# Patient Record
Sex: Male | Born: 1960
Health system: Southern US, Community
[De-identification: ages and names within clinical notes are randomized; demographics above are authoritative.]

## PROBLEM LIST (undated history)

## (undated) DIAGNOSIS — D509 Iron deficiency anemia, unspecified: Secondary | ICD-10-CM

## (undated) DIAGNOSIS — K219 Gastro-esophageal reflux disease without esophagitis: Secondary | ICD-10-CM

## (undated) DIAGNOSIS — Z87891 Personal history of nicotine dependence: Secondary | ICD-10-CM

## (undated) DIAGNOSIS — K5790 Diverticulosis of intestine, part unspecified, without perforation or abscess without bleeding: Secondary | ICD-10-CM

## (undated) DIAGNOSIS — K2270 Barrett's esophagus without dysplasia: Secondary | ICD-10-CM

## (undated) DIAGNOSIS — N200 Calculus of kidney: Secondary | ICD-10-CM

## (undated) DIAGNOSIS — N183 Chronic kidney disease, stage 3 unspecified: Secondary | ICD-10-CM

## (undated) DIAGNOSIS — J189 Pneumonia, unspecified organism: Secondary | ICD-10-CM

## (undated) DIAGNOSIS — M48061 Spinal stenosis, lumbar region without neurogenic claudication: Secondary | ICD-10-CM

## (undated) DIAGNOSIS — G8929 Other chronic pain: Secondary | ICD-10-CM

## (undated) DIAGNOSIS — Z9689 Presence of other specified functional implants: Secondary | ICD-10-CM

## (undated) DIAGNOSIS — K649 Unspecified hemorrhoids: Secondary | ICD-10-CM

## (undated) DIAGNOSIS — J40 Bronchitis, not specified as acute or chronic: Secondary | ICD-10-CM

## (undated) DIAGNOSIS — I10 Essential (primary) hypertension: Secondary | ICD-10-CM

## (undated) DIAGNOSIS — I5189 Other ill-defined heart diseases: Secondary | ICD-10-CM

## (undated) DIAGNOSIS — J449 Chronic obstructive pulmonary disease, unspecified: Secondary | ICD-10-CM

## (undated) DIAGNOSIS — R569 Unspecified convulsions: Secondary | ICD-10-CM

## (undated) DIAGNOSIS — E039 Hypothyroidism, unspecified: Secondary | ICD-10-CM

## (undated) DIAGNOSIS — I219 Acute myocardial infarction, unspecified: Secondary | ICD-10-CM

## (undated) DIAGNOSIS — E785 Hyperlipidemia, unspecified: Secondary | ICD-10-CM

## (undated) DIAGNOSIS — E291 Testicular hypofunction: Secondary | ICD-10-CM

## (undated) DIAGNOSIS — D649 Anemia, unspecified: Secondary | ICD-10-CM

## (undated) DIAGNOSIS — J349 Unspecified disorder of nose and nasal sinuses: Secondary | ICD-10-CM

## (undated) DIAGNOSIS — N400 Enlarged prostate without lower urinary tract symptoms: Secondary | ICD-10-CM

## (undated) DIAGNOSIS — F32A Depression, unspecified: Secondary | ICD-10-CM

## (undated) DIAGNOSIS — R0789 Other chest pain: Secondary | ICD-10-CM

## (undated) DIAGNOSIS — K589 Irritable bowel syndrome without diarrhea: Secondary | ICD-10-CM

## (undated) DIAGNOSIS — M7581 Other shoulder lesions, right shoulder: Secondary | ICD-10-CM

## (undated) DIAGNOSIS — G47419 Narcolepsy without cataplexy: Secondary | ICD-10-CM

## (undated) DIAGNOSIS — I6523 Occlusion and stenosis of bilateral carotid arteries: Secondary | ICD-10-CM

## (undated) DIAGNOSIS — M542 Cervicalgia: Secondary | ICD-10-CM

## (undated) DIAGNOSIS — M1990 Unspecified osteoarthritis, unspecified site: Secondary | ICD-10-CM

## (undated) DIAGNOSIS — K6381 Dieulafoy lesion of intestine: Secondary | ICD-10-CM

## (undated) DIAGNOSIS — Z87442 Personal history of urinary calculi: Secondary | ICD-10-CM

## (undated) DIAGNOSIS — G5602 Carpal tunnel syndrome, left upper limb: Secondary | ICD-10-CM

## (undated) DIAGNOSIS — I700 Atherosclerosis of aorta: Secondary | ICD-10-CM

## (undated) DIAGNOSIS — F329 Major depressive disorder, single episode, unspecified: Secondary | ICD-10-CM

## (undated) DIAGNOSIS — R7303 Prediabetes: Secondary | ICD-10-CM

## (undated) DIAGNOSIS — K635 Polyp of colon: Secondary | ICD-10-CM

## (undated) DIAGNOSIS — F419 Anxiety disorder, unspecified: Secondary | ICD-10-CM

## (undated) DIAGNOSIS — J45909 Unspecified asthma, uncomplicated: Secondary | ICD-10-CM

## (undated) DIAGNOSIS — T7840XA Allergy, unspecified, initial encounter: Secondary | ICD-10-CM

## (undated) DIAGNOSIS — I2510 Atherosclerotic heart disease of native coronary artery without angina pectoris: Secondary | ICD-10-CM

## (undated) DIAGNOSIS — K3580 Unspecified acute appendicitis: Secondary | ICD-10-CM

## (undated) DIAGNOSIS — I2109 ST elevation (STEMI) myocardial infarction involving other coronary artery of anterior wall: Secondary | ICD-10-CM

## (undated) DIAGNOSIS — G43909 Migraine, unspecified, not intractable, without status migrainosus: Secondary | ICD-10-CM

## (undated) DIAGNOSIS — E063 Autoimmune thyroiditis: Secondary | ICD-10-CM

## (undated) HISTORY — PX: HEMORRHOID SURGERY: SHX153

## (undated) HISTORY — PX: LIPOMA EXCISION: SHX5283

## (undated) HISTORY — PX: APPENDECTOMY: SHX54

## (undated) HISTORY — DX: Unspecified acute appendicitis: K35.80

## (undated) HISTORY — DX: Hyperlipidemia, unspecified: E78.5

## (undated) HISTORY — DX: Major depressive disorder, single episode, unspecified: F32.9

## (undated) HISTORY — DX: Gastro-esophageal reflux disease without esophagitis: K21.9

## (undated) HISTORY — DX: Unspecified asthma, uncomplicated: J45.909

## (undated) HISTORY — DX: Pneumonia, unspecified organism: J18.9

## (undated) HISTORY — PX: OTHER SURGICAL HISTORY: SHX169

## (undated) HISTORY — PX: BACK SURGERY: SHX140

## (undated) HISTORY — DX: Presence of other specified functional implants: Z96.89

## (undated) HISTORY — PX: SPINAL CORD STIMULATOR IMPLANT: SHX2422

## (undated) HISTORY — DX: Other chronic pain: G89.29

## (undated) HISTORY — DX: Allergy, unspecified, initial encounter: T78.40XA

## (undated) HISTORY — DX: Essential (primary) hypertension: I10

## (undated) HISTORY — DX: Unspecified disorder of nose and nasal sinuses: J34.9

## (undated) HISTORY — DX: Unspecified hemorrhoids: K64.9

## (undated) HISTORY — DX: Polyp of colon: K63.5

## (undated) HISTORY — DX: Migraine, unspecified, not intractable, without status migrainosus: G43.909

## (undated) HISTORY — DX: Barrett's esophagus without dysplasia: K22.70

## (undated) HISTORY — DX: Calculus of kidney: N20.0

## (undated) HISTORY — DX: Acute myocardial infarction, unspecified: I21.9

## (undated) HISTORY — DX: Anemia, unspecified: D64.9

## (undated) HISTORY — DX: Hypothyroidism, unspecified: E03.9

## (undated) HISTORY — DX: Depression, unspecified: F32.A

## (undated) HISTORY — DX: Unspecified osteoarthritis, unspecified site: M19.90

## (undated) HISTORY — PX: ANTERIOR CERVICAL DISCECTOMY: SHX1160

## (undated) HISTORY — DX: Irritable bowel syndrome, unspecified: K58.9

## (undated) HISTORY — DX: Unspecified convulsions: R56.9

## (undated) HISTORY — DX: Bronchitis, not specified as acute or chronic: J40

## (undated) MED FILL — Iron Sucrose Inj 20 MG/ML (Fe Equiv): INTRAVENOUS | Qty: 10 | Status: AC

---
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
## 1991-02-23 DIAGNOSIS — E039 Hypothyroidism, unspecified: Secondary | ICD-10-CM | POA: Insufficient documentation

## 1999-02-23 DIAGNOSIS — F329 Major depressive disorder, single episode, unspecified: Secondary | ICD-10-CM | POA: Insufficient documentation

## 1999-09-23 DIAGNOSIS — F411 Generalized anxiety disorder: Secondary | ICD-10-CM | POA: Insufficient documentation

## 2001-04-15 ENCOUNTER — Encounter: Payer: Self-pay | Admitting: Neurosurgery

## 2001-04-15 ENCOUNTER — Ambulatory Visit (HOSPITAL_COMMUNITY): Admission: RE | Admit: 2001-04-15 | Discharge: 2001-04-15 | Payer: Self-pay | Admitting: Neurosurgery

## 2002-04-27 ENCOUNTER — Encounter: Admission: RE | Admit: 2002-04-27 | Discharge: 2002-04-27 | Payer: Self-pay | Admitting: Neurosurgery

## 2002-04-27 ENCOUNTER — Encounter: Payer: Self-pay | Admitting: Neurosurgery

## 2002-05-01 ENCOUNTER — Encounter: Admission: RE | Admit: 2002-05-01 | Discharge: 2002-05-01 | Payer: Self-pay | Admitting: Neurosurgery

## 2002-05-02 ENCOUNTER — Encounter: Payer: Self-pay | Admitting: Neurosurgery

## 2002-05-31 ENCOUNTER — Encounter: Payer: Self-pay | Admitting: Neurosurgery

## 2002-06-04 ENCOUNTER — Encounter: Payer: Self-pay | Admitting: Neurosurgery

## 2002-06-04 ENCOUNTER — Inpatient Hospital Stay (HOSPITAL_COMMUNITY): Admission: RE | Admit: 2002-06-04 | Discharge: 2002-06-05 | Payer: Self-pay | Admitting: Neurosurgery

## 2002-10-16 ENCOUNTER — Ambulatory Visit (HOSPITAL_COMMUNITY): Admission: RE | Admit: 2002-10-16 | Discharge: 2002-10-16 | Payer: Self-pay | Admitting: Neurosurgery

## 2002-10-16 ENCOUNTER — Encounter: Payer: Self-pay | Admitting: Neurosurgery

## 2002-11-23 ENCOUNTER — Encounter: Admission: RE | Admit: 2002-11-23 | Discharge: 2002-11-23 | Payer: Self-pay | Admitting: Neurosurgery

## 2002-11-23 ENCOUNTER — Encounter: Payer: Self-pay | Admitting: Neurosurgery

## 2002-11-23 ENCOUNTER — Encounter: Payer: Self-pay | Admitting: Radiology

## 2002-12-11 ENCOUNTER — Encounter: Payer: Self-pay | Admitting: Neurosurgery

## 2002-12-11 ENCOUNTER — Encounter: Admission: RE | Admit: 2002-12-11 | Discharge: 2002-12-11 | Payer: Self-pay | Admitting: Neurosurgery

## 2003-07-24 ENCOUNTER — Other Ambulatory Visit: Payer: Self-pay

## 2003-11-23 DIAGNOSIS — M7710 Lateral epicondylitis, unspecified elbow: Secondary | ICD-10-CM | POA: Insufficient documentation

## 2003-11-23 DIAGNOSIS — G5600 Carpal tunnel syndrome, unspecified upper limb: Secondary | ICD-10-CM | POA: Insufficient documentation

## 2004-01-27 ENCOUNTER — Ambulatory Visit: Payer: Self-pay | Admitting: Unknown Physician Specialty

## 2004-04-22 DIAGNOSIS — K219 Gastro-esophageal reflux disease without esophagitis: Secondary | ICD-10-CM | POA: Insufficient documentation

## 2004-04-22 DIAGNOSIS — E785 Hyperlipidemia, unspecified: Secondary | ICD-10-CM | POA: Insufficient documentation

## 2004-05-01 ENCOUNTER — Ambulatory Visit: Payer: Self-pay | Admitting: Family Medicine

## 2004-05-05 ENCOUNTER — Ambulatory Visit: Payer: Self-pay | Admitting: Family Medicine

## 2004-05-06 ENCOUNTER — Ambulatory Visit: Payer: Self-pay | Admitting: Unknown Physician Specialty

## 2004-07-16 ENCOUNTER — Ambulatory Visit: Payer: Self-pay | Admitting: Internal Medicine

## 2004-08-03 ENCOUNTER — Ambulatory Visit: Payer: Self-pay | Admitting: Family Medicine

## 2004-08-05 ENCOUNTER — Ambulatory Visit: Payer: Self-pay | Admitting: Family Medicine

## 2004-10-16 ENCOUNTER — Encounter: Admission: RE | Admit: 2004-10-16 | Discharge: 2004-10-16 | Payer: Self-pay | Admitting: Anesthesiology

## 2005-01-29 ENCOUNTER — Ambulatory Visit: Payer: Self-pay | Admitting: Family Medicine

## 2005-06-03 ENCOUNTER — Ambulatory Visit: Payer: Self-pay | Admitting: Family Medicine

## 2005-06-09 ENCOUNTER — Ambulatory Visit: Payer: Self-pay | Admitting: Family Medicine

## 2005-06-16 ENCOUNTER — Ambulatory Visit: Payer: Self-pay | Admitting: Family Medicine

## 2005-06-21 ENCOUNTER — Ambulatory Visit: Payer: Self-pay | Admitting: Family Medicine

## 2005-07-02 ENCOUNTER — Ambulatory Visit: Payer: Self-pay | Admitting: Family Medicine

## 2005-08-27 ENCOUNTER — Ambulatory Visit: Payer: Self-pay | Admitting: Family Medicine

## 2005-08-31 ENCOUNTER — Ambulatory Visit: Payer: Self-pay | Admitting: Family Medicine

## 2005-09-22 DIAGNOSIS — R7989 Other specified abnormal findings of blood chemistry: Secondary | ICD-10-CM | POA: Insufficient documentation

## 2005-09-22 DIAGNOSIS — N411 Chronic prostatitis: Secondary | ICD-10-CM | POA: Insufficient documentation

## 2005-09-22 DIAGNOSIS — N400 Enlarged prostate without lower urinary tract symptoms: Secondary | ICD-10-CM | POA: Insufficient documentation

## 2005-09-30 ENCOUNTER — Inpatient Hospital Stay: Payer: Self-pay | Admitting: Internal Medicine

## 2005-10-28 ENCOUNTER — Ambulatory Visit: Payer: Self-pay | Admitting: Family Medicine

## 2005-11-01 ENCOUNTER — Ambulatory Visit: Payer: Self-pay | Admitting: Family Medicine

## 2005-11-02 ENCOUNTER — Ambulatory Visit: Payer: Self-pay | Admitting: Internal Medicine

## 2005-11-05 ENCOUNTER — Ambulatory Visit: Payer: Self-pay | Admitting: Family Medicine

## 2005-11-10 ENCOUNTER — Ambulatory Visit: Payer: Self-pay | Admitting: Family Medicine

## 2005-11-11 ENCOUNTER — Ambulatory Visit: Payer: Self-pay | Admitting: Family Medicine

## 2005-11-11 ENCOUNTER — Ambulatory Visit: Payer: Self-pay | Admitting: Unknown Physician Specialty

## 2005-11-11 ENCOUNTER — Emergency Department: Payer: Self-pay | Admitting: Emergency Medicine

## 2005-11-18 ENCOUNTER — Ambulatory Visit: Payer: Self-pay | Admitting: Internal Medicine

## 2005-11-19 ENCOUNTER — Ambulatory Visit: Payer: Self-pay | Admitting: Family Medicine

## 2005-11-24 ENCOUNTER — Ambulatory Visit: Payer: Self-pay | Admitting: Internal Medicine

## 2005-11-24 ENCOUNTER — Observation Stay (HOSPITAL_COMMUNITY): Admission: EM | Admit: 2005-11-24 | Discharge: 2005-11-26 | Payer: Self-pay | Admitting: Emergency Medicine

## 2005-11-24 ENCOUNTER — Ambulatory Visit: Payer: Self-pay | Admitting: Cardiovascular Disease

## 2005-11-24 ENCOUNTER — Encounter: Payer: Self-pay | Admitting: Cardiology

## 2005-12-16 ENCOUNTER — Ambulatory Visit: Payer: Self-pay | Admitting: Internal Medicine

## 2006-01-14 ENCOUNTER — Ambulatory Visit: Payer: Self-pay | Admitting: Internal Medicine

## 2006-02-10 ENCOUNTER — Ambulatory Visit: Payer: Self-pay | Admitting: Family Medicine

## 2006-03-04 ENCOUNTER — Ambulatory Visit: Payer: Self-pay | Admitting: Family Medicine

## 2006-03-04 LAB — CONVERTED CEMR LAB
ALT: 26 units/L (ref 0–40)
AST: 21 units/L (ref 0–37)
Albumin: 3.9 g/dL (ref 3.5–5.2)
Alkaline Phosphatase: 90 units/L (ref 39–117)
BUN: 5 mg/dL — ABNORMAL LOW (ref 6–23)
CO2: 28 meq/L (ref 19–32)
Calcium: 9.3 mg/dL (ref 8.4–10.5)
Chloride: 111 meq/L (ref 96–112)
Chol/HDL Ratio, serum: 3.2
Cholesterol: 138 mg/dL (ref 0–200)
Creatinine, Ser: 1.2 mg/dL (ref 0.4–1.5)
Free T4: 0.7 ng/dL (ref 0.6–1.6)
GFR calc non Af Amer: 70 mL/min
Glomerular Filtration Rate, Af Am: 85 mL/min/{1.73_m2}
Glucose, Bld: 101 mg/dL — ABNORMAL HIGH (ref 70–99)
HCT: 44.1 % (ref 39.0–52.0)
HDL: 42.8 mg/dL (ref >39.0)
Hemoglobin: 14.1 g/dL (ref 13.0–17.0)
LDL Cholesterol: 80 mg/dL (ref 0–99)
MCHC: 32 g/dL (ref 30.0–36.0)
MCV: 84.8 fL (ref 78.0–100.0)
Platelets: 274 10*3/uL (ref 150–400)
Potassium: 3.8 meq/L (ref 3.5–5.1)
RBC: 5.2 M/uL (ref 4.22–5.81)
RDW: 13.1 % (ref 11.5–14.6)
Sodium: 144 meq/L (ref 135–145)
T3, Free: 2.7 pg/mL (ref 2.3–4.2)
TSH: 2.9 microintl units/mL (ref 0.35–5.50)
Testosterone, total: 5.0371 ng/mL
Total Bilirubin: 0.6 mg/dL (ref 0.3–1.2)
Total Protein: 6.8 g/dL (ref 6.0–8.3)
Triglyceride fasting, serum: 75 mg/dL (ref 0–149)
VLDL: 15 mg/dL (ref 0–40)
WBC: 7.4 10*3/uL (ref 4.5–10.5)

## 2006-03-08 ENCOUNTER — Ambulatory Visit: Payer: Self-pay | Admitting: Family Medicine

## 2006-04-13 ENCOUNTER — Ambulatory Visit: Payer: Self-pay | Admitting: Internal Medicine

## 2006-09-06 ENCOUNTER — Encounter (INDEPENDENT_AMBULATORY_CARE_PROVIDER_SITE_OTHER): Payer: Self-pay | Admitting: *Deleted

## 2006-09-23 ENCOUNTER — Encounter: Payer: Self-pay | Admitting: Family Medicine

## 2006-10-31 ENCOUNTER — Ambulatory Visit: Payer: Self-pay | Admitting: Family Medicine

## 2006-10-31 DIAGNOSIS — R5381 Other malaise: Secondary | ICD-10-CM | POA: Insufficient documentation

## 2006-10-31 DIAGNOSIS — R5383 Other fatigue: Secondary | ICD-10-CM

## 2006-10-31 LAB — CONVERTED CEMR LAB
ALT: 21 units/L (ref 0–53)
AST: 18 units/L (ref 0–37)
Albumin: 4.1 g/dL (ref 3.5–5.2)
Alkaline Phosphatase: 82 units/L (ref 39–117)
BUN: 6 mg/dL (ref 6–23)
Basophils Absolute: 0 10*3/uL (ref 0.0–0.1)
Basophils Relative: 0.6 % (ref 0.0–1.0)
Bilirubin, Direct: 0.1 mg/dL (ref 0.0–0.3)
CO2: 31 meq/L (ref 19–32)
Calcium: 9 mg/dL (ref 8.4–10.5)
Chloride: 108 meq/L (ref 96–112)
Creatinine, Ser: 1.1 mg/dL (ref 0.4–1.5)
Eosinophils Absolute: 0.2 10*3/uL (ref 0.0–0.6)
Eosinophils Relative: 2.5 % (ref 0.0–5.0)
Folate: >20 ng/mL
Free T4: 0.6 ng/dL (ref 0.6–1.6)
GFR calc Af Amer: 94 mL/min
GFR calc non Af Amer: 78 mL/min
Glucose, Bld: 89 mg/dL (ref 70–99)
HCT: 38.7 % — ABNORMAL LOW (ref 39.0–52.0)
Hemoglobin: 13.1 g/dL (ref 13.0–17.0)
Iron: 88 ug/dL (ref 42–165)
Lymphocytes Relative: 35.7 % (ref 12.0–46.0)
MCHC: 33.9 g/dL (ref 30.0–36.0)
MCV: 85.7 fL (ref 78.0–100.0)
Monocytes Absolute: 0.6 10*3/uL (ref 0.2–0.7)
Monocytes Relative: 7.9 % (ref 3.0–11.0)
Neutro Abs: 3.9 10*3/uL (ref 1.4–7.7)
Neutrophils Relative %: 53.3 % (ref 43.0–77.0)
Platelets: 260 10*3/uL (ref 150–400)
Potassium: 4 meq/L (ref 3.5–5.1)
RBC: 4.51 M/uL (ref 4.22–5.81)
RDW: 13.2 % (ref 11.5–14.6)
Sodium: 145 meq/L (ref 135–145)
TSH: 4.45 microintl units/mL (ref 0.35–5.50)
Total Bilirubin: 0.5 mg/dL (ref 0.3–1.2)
Total Protein: 7.2 g/dL (ref 6.0–8.3)
Vitamin B-12: 421 pg/mL (ref 211–911)
WBC: 7.3 10*3/uL (ref 4.5–10.5)

## 2006-12-23 ENCOUNTER — Ambulatory Visit: Payer: Self-pay | Admitting: Unknown Physician Specialty

## 2006-12-29 ENCOUNTER — Encounter: Payer: Self-pay | Admitting: Family Medicine

## 2007-01-30 ENCOUNTER — Telehealth: Payer: Self-pay | Admitting: Family Medicine

## 2007-01-31 ENCOUNTER — Ambulatory Visit: Payer: Self-pay | Admitting: Family Medicine

## 2007-02-01 ENCOUNTER — Telehealth: Payer: Self-pay | Admitting: Family Medicine

## 2007-02-01 LAB — CONVERTED CEMR LAB: TSH: 5.58 microintl units/mL — ABNORMAL HIGH (ref 0.35–5.50)

## 2007-02-28 ENCOUNTER — Telehealth: Payer: Self-pay | Admitting: Family Medicine

## 2007-03-10 ENCOUNTER — Encounter: Payer: Self-pay | Admitting: Family Medicine

## 2007-03-14 ENCOUNTER — Encounter: Payer: Self-pay | Admitting: Family Medicine

## 2007-03-27 ENCOUNTER — Encounter: Payer: Self-pay | Admitting: Family Medicine

## 2007-04-27 ENCOUNTER — Encounter: Payer: Self-pay | Admitting: Family Medicine

## 2007-05-25 ENCOUNTER — Encounter: Payer: Self-pay | Admitting: Family Medicine

## 2007-06-01 ENCOUNTER — Encounter: Payer: Self-pay | Admitting: Family Medicine

## 2007-06-16 ENCOUNTER — Telehealth: Payer: Self-pay | Admitting: Family Medicine

## 2007-06-20 ENCOUNTER — Ambulatory Visit: Payer: Self-pay | Admitting: Family Medicine

## 2007-06-20 DIAGNOSIS — IMO0002 Reserved for concepts with insufficient information to code with codable children: Secondary | ICD-10-CM | POA: Insufficient documentation

## 2007-06-29 ENCOUNTER — Encounter: Payer: Self-pay | Admitting: Family Medicine

## 2007-08-07 ENCOUNTER — Encounter: Payer: Self-pay | Admitting: Family Medicine

## 2007-08-18 ENCOUNTER — Telehealth: Payer: Self-pay | Admitting: Family Medicine

## 2007-08-21 ENCOUNTER — Telehealth: Payer: Self-pay | Admitting: Family Medicine

## 2007-10-27 ENCOUNTER — Encounter: Payer: Self-pay | Admitting: Family Medicine

## 2007-11-13 ENCOUNTER — Encounter: Payer: Self-pay | Admitting: Family Medicine

## 2007-12-04 ENCOUNTER — Encounter: Payer: Self-pay | Admitting: Family Medicine

## 2007-12-12 ENCOUNTER — Ambulatory Visit (HOSPITAL_COMMUNITY): Admission: RE | Admit: 2007-12-12 | Discharge: 2007-12-12 | Payer: Self-pay | Admitting: Anesthesiology

## 2008-02-08 ENCOUNTER — Encounter: Payer: Self-pay | Admitting: Family Medicine

## 2008-03-28 ENCOUNTER — Telehealth: Payer: Self-pay | Admitting: Family Medicine

## 2008-04-29 ENCOUNTER — Encounter: Payer: Self-pay | Admitting: Family Medicine

## 2008-07-11 ENCOUNTER — Encounter: Payer: Self-pay | Admitting: Family Medicine

## 2008-07-11 ENCOUNTER — Ambulatory Visit: Payer: Self-pay | Admitting: Internal Medicine

## 2008-08-05 ENCOUNTER — Telehealth: Payer: Self-pay | Admitting: Family Medicine

## 2008-09-09 ENCOUNTER — Telehealth: Payer: Self-pay | Admitting: Family Medicine

## 2008-10-14 ENCOUNTER — Telehealth: Payer: Self-pay | Admitting: Family Medicine

## 2008-10-16 ENCOUNTER — Ambulatory Visit: Payer: Self-pay | Admitting: Family Medicine

## 2008-10-16 DIAGNOSIS — I10 Essential (primary) hypertension: Secondary | ICD-10-CM | POA: Insufficient documentation

## 2008-11-11 ENCOUNTER — Encounter: Payer: Self-pay | Admitting: Family Medicine

## 2009-02-25 ENCOUNTER — Ambulatory Visit: Payer: Self-pay | Admitting: Family Medicine

## 2009-03-12 ENCOUNTER — Encounter: Admission: RE | Admit: 2009-03-12 | Discharge: 2009-03-12 | Payer: Self-pay | Admitting: Anesthesiology

## 2009-03-20 ENCOUNTER — Telehealth: Payer: Self-pay | Admitting: Family Medicine

## 2009-03-21 ENCOUNTER — Ambulatory Visit: Payer: Self-pay | Admitting: Family Medicine

## 2009-03-24 ENCOUNTER — Telehealth: Payer: Self-pay | Admitting: Family Medicine

## 2009-04-17 ENCOUNTER — Ambulatory Visit: Payer: Self-pay | Admitting: Family Medicine

## 2009-04-17 LAB — CONVERTED CEMR LAB
ALT: 20 units/L (ref 0–53)
AST: 20 units/L (ref 0–37)
Albumin: 3.9 g/dL (ref 3.5–5.2)
Alkaline Phosphatase: 54 units/L (ref 39–117)
BUN: 6 mg/dL (ref 6–23)
Basophils Absolute: 0 10*3/uL (ref 0.0–0.1)
Basophils Relative: 0 % (ref 0.0–3.0)
Bilirubin, Direct: 0 mg/dL (ref 0.0–0.3)
CO2: 30 meq/L (ref 19–32)
Calcium: 8.8 mg/dL (ref 8.4–10.5)
Chloride: 109 meq/L (ref 96–112)
Cholesterol: 179 mg/dL (ref 0–200)
Creatinine, Ser: 1.2 mg/dL (ref 0.4–1.5)
Creatinine,U: 165.7 mg/dL
Direct LDL: 121.6 mg/dL
Eosinophils Absolute: 0.1 10*3/uL (ref 0.0–0.7)
Eosinophils Relative: 1.3 % (ref 0.0–5.0)
Free T4: 0.6 ng/dL (ref 0.6–1.6)
GFR calc non Af Amer: 69.21 mL/min (ref >60)
Glucose, Bld: 95 mg/dL (ref 70–99)
HCT: 35.3 % — ABNORMAL LOW (ref 39.0–52.0)
HDL: 39.9 mg/dL (ref >39.00)
Hemoglobin: 11.5 g/dL — ABNORMAL LOW (ref 13.0–17.0)
Lymphocytes Relative: 29.4 % (ref 12.0–46.0)
Lymphs Abs: 1.9 10*3/uL (ref 0.7–4.0)
MCHC: 32.5 g/dL (ref 30.0–36.0)
MCV: 80.3 fL (ref 78.0–100.0)
Microalb Creat Ratio: 4.2 mg/g (ref 0.0–30.0)
Microalb, Ur: 0.7 mg/dL (ref 0.0–1.9)
Monocytes Absolute: 0.6 10*3/uL (ref 0.1–1.0)
Monocytes Relative: 9.4 % (ref 3.0–12.0)
Neutro Abs: 4 10*3/uL (ref 1.4–7.7)
Neutrophils Relative %: 59.9 % (ref 43.0–77.0)
Platelets: 313 10*3/uL (ref 150.0–400.0)
Potassium: 3.7 meq/L (ref 3.5–5.1)
RBC: 4.4 M/uL (ref 4.22–5.81)
RDW: 16.5 % — ABNORMAL HIGH (ref 11.5–14.6)
Sodium: 142 meq/L (ref 135–145)
TSH: 0.05 microintl units/mL — ABNORMAL LOW (ref 0.35–5.50)
Total Bilirubin: 0 mg/dL — ABNORMAL LOW (ref 0.3–1.2)
Total CHOL/HDL Ratio: 4
Total Protein: 7 g/dL (ref 6.0–8.3)
Triglycerides: 249 mg/dL — ABNORMAL HIGH (ref 0.0–149.0)
VLDL: 49.8 mg/dL — ABNORMAL HIGH (ref 0.0–40.0)
Vit D, 25-Hydroxy: >120 ng/mL — ABNORMAL HIGH (ref 30–89)
WBC: 6.6 10*3/uL (ref 4.5–10.5)

## 2009-04-22 ENCOUNTER — Ambulatory Visit: Payer: Self-pay | Admitting: Family Medicine

## 2009-05-21 ENCOUNTER — Encounter: Payer: Self-pay | Admitting: Family Medicine

## 2009-07-23 ENCOUNTER — Encounter: Payer: Self-pay | Admitting: Family Medicine

## 2009-07-25 ENCOUNTER — Telehealth: Payer: Self-pay | Admitting: Family Medicine

## 2009-07-29 ENCOUNTER — Ambulatory Visit: Payer: Self-pay | Admitting: Family Medicine

## 2009-09-22 ENCOUNTER — Ambulatory Visit: Payer: Self-pay | Admitting: Internal Medicine

## 2009-09-30 ENCOUNTER — Encounter (INDEPENDENT_AMBULATORY_CARE_PROVIDER_SITE_OTHER): Payer: Self-pay | Admitting: *Deleted

## 2009-10-10 ENCOUNTER — Inpatient Hospital Stay: Payer: Self-pay | Admitting: Specialist

## 2009-10-14 LAB — PATHOLOGY REPORT

## 2009-10-20 ENCOUNTER — Telehealth: Payer: Self-pay | Admitting: Family Medicine

## 2009-11-13 ENCOUNTER — Ambulatory Visit: Payer: Self-pay | Admitting: Internal Medicine

## 2009-11-14 ENCOUNTER — Ambulatory Visit: Payer: Self-pay | Admitting: Internal Medicine

## 2010-03-26 NOTE — Assessment & Plan Note (Signed)
Summary: refill medications/whc   Vital Signs:  Patient profile:   50 year old male Height:      71 inches Weight:      202 pounds BMI:     28.28 Temp:     98.4 degrees F oral Pulse rate:   80 / minute Pulse rhythm:   regular BP sitting:   140 / 80  (left arm) Cuff size:   large  Vitals Entered By: Providence Crosby LPN (October 16, 2008 9:07 AM) CC: refill medications //had spinal stimulator puit in 01/2008//colonoscopy at [facility] 2008   History of Present Illness: Pt here at my request for followup. He had a spinal stimulator for back pain inserted last Dec and has just been released to start exercising again soo his weight has been elevated also. Sister has some heart issues lately and pressure on him is elevated. He has been off cigarettes for three years two weeks ago and is trying tio get his sister off of them...she has angina and heart disease. This causes anxiousness in him. He has been seen multiple times by multiple people for multiple problems. Dr Patrecia Pace has released him. He is to have a sleep study soon. His Bp has been up and down lately at other offices.  Problems Prior to Update: 1)  Back Pain With Radiculopathy  (ICD-729.2) 2)  Hashimoto's Thyroiditis (DR MORYATI)  (ICD-245.2) 3)  Fatigue  (ICD-780.79) 4)  Hyperglycemia  (ICD-790.6) 5)  Fatty Liver Disease Via Chest Ct  (ICD-571.8) 6)  Prostatitis, Chronic  (ICD-601.1) 7)  Benign Prostatic Hypertrophy  (ICD-600.00) 8)  Depression  (ICD-311) 9)  Gerd  (ICD-530.81) 10)  Hyperlipidemia  (ICD-272.4) 11)  Carpal Tunnel Syndrome, Bilateral  (ICD-354.0) 12)  Lateral Epicondylitis, Bilateral  (ICD-726.32) 13)  Syndrome, Chronic Pain Due To L/s Back Dz  (ICD-338.4) 14)  Disorder, Tobacco Use  (ICD-305.1) 15)  Anxiety  (ICD-300.00) 16)  Hypothyroidism  (ICD-244.9)  Medications Prior to Update: 1)  Nexium 40 Mg Cpdr (Esomeprazole Magnesium) .... Take 1 Capsule By Mouth Once A Day 2)  Topamax 100 Mg Tabs (Topiramate) ....  Take 1 Tablet By Mouth At Bedtime 3)  Provigil 200 Mg Tabs (Modafinil) .... Take Tablet By Mouth Twice A Day 4)  Avinza 90 Mg Cp24 (Morphine Sulfate Beads) .... Take 1 Capsule By Mouth Once A Day 5)  Bupropion Hcl 300 Mg Tb24 (Bupropion Hcl) .... Take 1 Tablet By Mouth Once A Day 6)  Synthroid 175 Mcg  Tabs (Levothyroxine Sodium) .... One Tab By Mouth Once Daily On Empty Stomach 7)  Alprazolam 1 Mg Tabs (Alprazolam) .... Take 1 Tablet By Mouth Every Six Hours 8)  Vitamin E 1000 Unit  Caps (Vitamin E) .... Take One By Mouth Daily 9)  Mens Multivitamin Plus   Tabs (Multiple Vitamins-Minerals) .... Take One By Mouth Daily 10)  Stool Softener 11)  Testim !% .Marland Kitchen.. 1 Tube Daily 12)  Oxygen 2 Liters At [facility] 13)  Citalopram Hydrobromide 20 Mg  Tabs (Citalopram Hydrobromide) .Marland Kitchen.. 1 Daily 14)  Cytomel 25 Mcg  Tabs (Liothyronine Sodium) .Marland Kitchen.. 1 Daily 15)  Permethrin 5 %  Crea (Permethrin) .... Applyneck To Toes Including Under Nails and Repeated in One Week.  Allergies: 1)  ! Neurontin (Gabapentin) 2)  Amoxicillin (Amoxicillin) 3)  Penicillin G Potassium (Penicillin G Potassium)  Past History:  Past Surgical History: Epidurals, Facet injections, Joint injections, Myelogram  Pre 2005 ABD U/S  Fatty Liver 10/05/2001 Colonoscopy int hemms Mechele Collin) 02/2002 Back Surgery bone frag  removed (Kritzer) 2004 EGD Barretts, Dilation 05/06/2004 [facility] Atyp pneum  Interstit Pneumonmitis 8/9-8/18/2007 CT Chest [facility]) nml 10/01/2005 CT Abd/Pelvis nml 10/07/2005 CT Chest no PE sm hilar/med LN 11/24/2005 MRI Brain  [facility] Atyp CP,  Depress,  Chronic Pain Syndr, GERD 10/3-10/06/2005 ECHO nml EF 55% 11/25/2005       CT Angio no PE,  Fatty Liver Thyroid Nuclear Scan Nml Uptake, Decr Activity L Inf and Isthmus (Dr Kerrie Pleasure) 03/14/2007 Back Stimulator insertion for chronic back pain 01/2008  Physical Exam  General:  Well-developed,well-nourished,in no acute distress; alert,appropriate and cooperative throughout examination  Head:  Normocephalic and atraumatic without obvious abnormalities. No apparent alopecia or balding. Eyes:  Conjunctiva clear bilaterally.  Ears:  External ear exam shows no significant lesions or deformities.  Otoscopic examination reveals clear canals, tympanic membranes are intact bilaterally without bulging, retraction, inflammation or discharge. Hearing is grossly normal bilaterally. Cerumen in right ear occluding. Nose:  External nasal examination shows no deformity or inflammation. Nasal mucosa are pink and moist without lesions or exudates. Mouth:  Oral mucosa and oropharynx without lesions or exudates.  Teeth in good repair. Neck:  No deformities, masses, or tenderness noted. Chest Wall:  No deformities, masses, tenderness or gynecomastia noted. Lungs:  Normal respiratory effort, chest expands symmetrically. Lungs are clear to auscultation, no crackles or wheezes. Heart:  Normal rate and regular rhythm. S1 and S2 normal without gallop, murmur, click, rub or other extra sounds.   Impression & Recommendations:  Problem # 1:  BACK PAIN WITH RADICULOPATHY (ICD-729.2) Assessment Improved  Problem # 2:  ELEVATED BP READING WITHOUT DX HYPERTENSION (ICD-796.2) Assessment: New Lose weight and avoid salt. Recheck in 4 mos.  Complete Medication List: 1)  Nexium 40 Mg Cpdr (Esomeprazole magnesium) .... Take 1 capsule by mouth once a day 2)  Topamax 100 Mg Tabs (Topiramate) .... Take 1 tablet by mouth at bedtime 3)  Provigil 200 Mg Tabs (Modafinil) .... Take tablet by mouth twice a day 4)  Bupropion Hcl 300 Mg Tb24 (Bupropion hcl) .... Take 1 tablet by mouth once a day 5)  Synthroid 175 Mcg Tabs (Levothyroxine sodium) .... One tab by mouth once daily on empty stomach 6)  Alprazolam 1 Mg Tabs (Alprazolam) .... Take 1 tablet by mouth every six hours 7)  Vitamin E 1000 Unit Caps (Vitamin e) .... Take one by mouth daily 8)  Mens Multivitamin Plus Tabs (Multiple vitamins-minerals) .... Take one  by mouth daily 9)  Stool Softener  10)  Citalopram Hydrobromide 20 Mg Tabs (Citalopram hydrobromide) .Marland Kitchen.. 1 daily 11)  Cytomel 25 Mcg Tabs (Liothyronine sodium) .Marland Kitchen.. 1 daily 12)  Testosterone Cypionate 200 Mg/ml Oil (Testosterone cypionate) .... 2 shots per month 13)  Uroxatral 10 Mg Xr24h-tab (Alfuzosin hcl) .Marland Kitchen.. 1 daily by mouth 14)  Percocet 10-325 Mg Tabs (Oxycodone-acetaminophen) .... Every 4 to 6 hours as needed pain by mouth 15)  Omega 3 1200 Mg Daily  .Marland Kitchen.. 1 daily by mouth 16)  Vitamin D 500 Iu  .Marland Kitchen.. 1 daily by mouth 17)  Red Yeast Rice 600 Mg Tabs (Red yeast rice extract) .Marland Kitchen.. 1200 mg daily by mouth 18)  Vitamin B-12 500 Mcg Tabs (Cyanocobalamin) .Marland Kitchen.. 1 daily by mouth  Patient Instructions: 1)  RTC 4 mos BP recheck. Prescriptions: BUPROPION HCL 300 MG TB24 (BUPROPION HCL) Take 1 tablet by mouth once a day  #30 Tablet x 12   Entered and Authorized by:   Shaune Leeks MD   Signed by:   Molly Maduro  Liana Crocker MD on 10/16/2008   Method used:   Electronically to        [facility]. [address] [phone]* (retail)       [address] [place], [place]  [id number]       Ph: [phone]       Fax: [phone]   RxID:   [id number]    Immunization History:  Tetanus/Td Immunization History:    Tetanus/Td:  historical (02/22/2005)  Pneumovax Immunization History:    Pneumovax:  historical (02/22/2005)

## 2010-03-26 NOTE — Progress Notes (Signed)
Summary: ? Shingles  Phone Note Call from Patient Call back at [phone] ext [phone]   Caller: Patient Call For: Shaune Leeks MD Summary of Call: Patient called and stated that he has been having pain along with itching in his left arm x three weeks.  Now he has developled welts, a series of red dots, and a few open spots on his arm.  The pain is so severe it hurts "down to the bone."  Thinks it may be shingles.  Should he be scheduled to come in?  Please advise Initial call taken by: Linde Gillis CMA Duncan Dull),  March 20, 2009 11:37 AM  Follow-up for Phone Call        Probably should be seen. Follow-up by: Shaune Leeks MD,  March 20, 2009 3:34 PM  Additional Follow-up for Phone Call Additional follow up Details #1::        Appt scheduled for 10:45 tomorrow with Dr. Dayton Martes.  Left message on voicemail at patient's job to return call to confirm that he got the message and that this appt time was ok.  Linde Gillis CMA Duncan Dull)  March 20, 2009 3:55 PM

## 2010-03-26 NOTE — Consult Note (Signed)
Summary: [facility]/Consultation Report/Dr. [facility]/Consultation Report/Dr. Patrecia Pace   Imported By: Mickle Asper 06/08/2007 14:14:23  _____________________________________________________________________  External Attachment:    Type:   Image     Comment:   External Document

## 2010-03-26 NOTE — Consult Note (Signed)
Summary: [place] [facility] - TECHNETIUM THYROID UPTAKE AND SCAN /  [place] [facility] - TECHNETIUM THYROID UPTAKE AND SCAN / DR. Doreatha Martin MORAYATI   Imported By: Carin Primrose 03/17/2007 15:13:34  _____________________________________________________________________  External Attachment:    Type:   Image     Comment:   External Document  Appended Document: [place] [facility] - TECHNETIUM THYROID UPTAKE AND SCAN /       Current Allergies: ! NEURONTIN (GABAPENTIN) AMOXICILLIN (AMOXICILLIN) PENICILLIN G POTASSIUM (PENICILLIN G POTASSIUM)  Past Surgical History:    Epidurals, Facet injections, Joint injections, Myelogram  Pre 2005    ABD U/S  Fatty Liver 10/05/2001    Colonoscopy int hemms Mec[facility] Collin) 02/2002    Back Surgery bone frag removed ([facility]) 2004    EGD Barretts, Dilation 05/06/2004    [facility] Atyp pneum  Interstit Pneumonmitis 8/9-8/18/2007    CT Chest [facility]) nml 10/01/2005    CT Abd/Pelvis nml 10/07/2005    CT Chest no PE sm hilar/med LN 11/24/2005    MRI Brain     [facility] Atyp CP,  Depress,  Chronic Pain Syndr, GERD 10/3-10/06/2005    ECHO nml EF 55% 11/25/2005       CT Angio no PE,  Fatty Liver    Thyroid Nuclear Scan Nml Uptake, Decr Activity L Inf and Isthmus (Dr Kerrie Pleasure) 03/14/2007        Complete Medication List: 1)  Nexium 40 Mg Cpdr (Esomeprazole magnesium) .... Take 1 capsule by mouth once a day 2)  Topamax 100 Mg Tabs (Topiramate) .... Take 1 tablet by mouth at bedtime 3)  Provigil 200 Mg Tabs (Modafinil) .... Take tablet by mouth twice a day 4)  Avinza 90 Mg Cp24 (Morphine sulfate beads) .... Take 1 capsule by mouth once a day 5)  Bupropion Hcl 300 Mg Tb24 (Bupropion hcl) .... Take 1 tablet by mouth once a day 6)  Synthroid 175 Mcg Tabs (Levothyroxine sodium) .... One tab by mouth once daily on empty stomach 7)  Alprazolam 1 Mg Tabs (Alprazolam) .... Take 1 tablet by mouth every six hours 8)  Vitamin E 1000 Unit Caps (Vitamin e) .... Take one  by mouth daily 9)  Mens Multivitamin Plus Tabs (Multiple vitamins-minerals) .... Take one by mouth daily 10)  Stool Softener  11)  Testim !%  .Marland Kitchen.. 1 tube daily 12)  Oxygen 2 Liters At [facility]      ]

## 2010-03-26 NOTE — Progress Notes (Signed)
Summary: Hypothyroidism  Phone Note From Other Clinic   Caller: Nurse, Dr. Wynn Maudlin Office, [facility] Call For: Dr. Hetty Ely Summary of Call: Dr. Achilles Dunk did a thyroid cascade and found the patient to be hypothyroid.  Labs are being faxed to this office.  Patient is advised to call for an appointment to follow up with you. Initial call taken by: Delilah Shan CMA Duncan Dull),  July 25, 2009 2:39 PM  Follow-up for Phone Call        Noted. He was overcontrolled when checked in Feb. Follow-up by: Shaune Leeks MD,  July 25, 2009 4:31 PM

## 2010-03-26 NOTE — Consult Note (Signed)
Summary: [facility]inology/Consultation Report/Dr. Altheimer  [facility] Endocrinology/Consultation Report/Dr. Altheimer   Imported By: Mickle Asper 08/11/2007 09:50:33  _____________________________________________________________________  External Attachment:    Type:   Image     Comment:   External Document

## 2010-03-26 NOTE — Consult Note (Signed)
Summary: [facility]/Dr. [facility]/Dr. Patrecia Pace   Imported By: Eleonore Chiquito 03/29/2007 15:45:07  _____________________________________________________________________  External Attachment:    Type:   Image     Comment:   External Document  Appended Document: [facility]/Dr. Patrecia Pace       Current Allergies: ! NEURONTIN (GABAPENTIN) AMOXICILLIN (AMOXICILLIN) PENICILLIN G POTASSIUM (PENICILLIN G POTASSIUM)        Complete Medication List: 1)  Nexium 40 Mg Cpdr (Esomeprazole magnesium) .... Take 1 capsule by mouth once a day 2)  Topamax 100 Mg Tabs (Topiramate) .... Take 1 tablet by mouth at bedtime 3)  Provigil 200 Mg Tabs (Modafinil) .... Take tablet by mouth twice a day 4)  Avinza 90 Mg Cp24 (Morphine sulfate beads) .... Take 1 capsule by mouth once a day 5)  Bupropion Hcl 300 Mg Tb24 (Bupropion hcl) .... Take 1 tablet by mouth once a day 6)  Synthroid 175 Mcg Tabs (Levothyroxine sodium) .... One tab by mouth once daily on empty stomach 7)  Alprazolam 1 Mg Tabs (Alprazolam) .... Take 1 tablet by mouth every six hours 8)  Vitamin E 1000 Unit Caps (Vitamin e) .... Take one by mouth daily 9)  Mens Multivitamin Plus Tabs (Multiple vitamins-minerals) .... Take one by mouth daily 10)  Stool Softener  11)  Testim !%  .Marland Kitchen.. 1 tube daily 12)  Oxygen 2 Liters At [facility]      ]

## 2010-03-26 NOTE — Progress Notes (Signed)
Summary: wellbutrin rx  Phone Note Refill Request Message from:  [employer] on August 05, 2008 9:44 AM  Refills Requested: Medication #1:  BUPROPION HCL 300 MG TB24 Take 1 tablet by mouth once a day   Last Refilled: 07/08/2008  [facility] [address] [phone]* Pharmacy Phone:  [phone]  Initial call taken by: Liane Comber,  August 05, 2008 9:45 AM  Follow-up for Phone Call        Request for Bupropion HCL 300mg . Please advise Follow-up by: Liane Comber,  August 05, 2008 10:49 AM  Additional Follow-up for Phone Call Additional follow up Details #1::        Pls have pt see me within the month for followup. Additional Follow-up by: Shaune Leeks MD,  August 05, 2008 12:33 PM    Additional Follow-up for Phone Call Additional follow up Details #2::    Rx done, was sent electronically. ....................................................Marland KitchenLiane Comber August 05, 2008 12:53 PM     Prescriptions: BUPROPION HCL 300 MG TB24 (BUPROPION HCL) Take 1 tablet by mouth once a day  #30 x 0   Entered by:   Shaune Leeks MD   Authorized by:   Judith Part MD   Signed by:   Shaune Leeks MD on 08/05/2008   Method used:   Telephoned to ...         RxID:   [id number]

## 2010-03-26 NOTE — Progress Notes (Signed)
   Phone Note From Pharmacy   Caller: [facility]// [id number] Call For: DR.Amelianna Meller  Summary of Call: EMR RX NOT WORKING // RX FOR LASIX 20MG  two times a day CALLED INTO ABOVE NUMBER#60 WITH 3 REFILLS Initial call taken by: Providence Crosby,  January 30, 2007 10:28 AM

## 2010-03-26 NOTE — Progress Notes (Signed)
Summary: fatigue /weight gain/ depression   Phone Note Call from Patient Call back at [phone] x [phone]   Caller: Patient Call For: Elysse Polidore  Summary of Call: pt has been on synthroid x 1 month c/o extreme fatique, weight gain, zits everywhere, feels depressed. syntiroid was increased from 137 150 then to . pt says he does not see a difference on med, pt says he sees dr Welton Flakes and his pituatary levels are off as well. Pt says he can hardly function please advise Initial call taken by: Liane Comber,  February 28, 2007 4:29 PM  Follow-up for Phone Call        The pituitary supercedes the thyroid and needs to be addressed /controlled before or along with the thyroid. Followup with Dr Welton Flakes for both as they should be addressed in context with each other.Marland KitchenMarland KitchenI am not an endocrinologist and do not treat pituitary problems. Follow-up by: Shaune Leeks MD,  February 28, 2007 5:10 PM  Additional Follow-up for Phone Call Additional follow up Details #1::        pt advised he says he would like referal to Dr Johny Chess, he says dr Welton Flakes was treating him for somethig else, He says Dr Achilles Dunk actually initally tested him for pituitary while testing his testerone. He tried to get in with Dr Johny Chess but was told he needs referral that states he is to be treated for pituarity and thyroid or Dr would only treat him for thyroid. Can you refer? Additional Follow-up by: Liane Comber,  February 28, 2007 5:58 PM    Additional Follow-up for Phone Call Additional follow up Details #2::    Sure. Follow-up by: Shaune Leeks MD,  February 28, 2007 6:04 PM  Additional Follow-up for Phone Call Additional follow up Details #3:: Details for Additional Follow-up Action Taken: DR [facility] ON 04/05/07 LABS AND OFFICE NOTES FAXED. ...................................................................Carlton Adam  March 01, 2007 12:36 PM  Additional Follow-up by: Carlton Adam,  March 01, 2007 12:36 PM

## 2010-03-26 NOTE — Assessment & Plan Note (Signed)
Summary: DISCUSS LABS FROM DR COPE/CLE   Vital Signs:  Patient profile:   50 year old male Weight:      209.25 pounds Temp:     97.8 degrees F oral Pulse rate:   72 / minute Pulse rhythm:   regular BP sitting:   120 / 74  (left arm) Cuff size:   large  Vitals Entered By: Sydell Axon LPN (July 29, 4740 10:30 AM) CC: Discuss labs from Dr. Achilles Dunk   History of Present Illness: Pt here  due to labs at Dr Cope's showing pt to be hypothyroid, which is not new information. THe pt has been on replacement thyroid medication for quite some time and has been seen by Dr Patrecia Pace, a thyroid specialist in [place] and by Dr Abbey Chatters, an Endocrinologist in [place]. He has been on significantly large doses of Synthroid and Cytomel in the past, currently on Synthroid and Cytomel . He fels ok except for mild fatigue. He is also on Testosterone replacement via Dr Achilles Dunk, 150mg  twice a month IM. He relates hair more coarse lately and some percieved skin changes. He has gained seven  pounds since the beginning of the year with some regular  exercise.  Problems Prior to Update: 1)  Health Maintenance Exam  (ICD-V70.0) 2)  Essential Hypertension  (ICD-401.9) 3)  Back Pain With Radiculopathy  (ICD-729.2) 4)  Hashimoto's Thyroiditis (DR MORYATI)  (ICD-245.2) 5)  Fatigue  (ICD-780.79) 6)  Hyperglycemia  (ICD-790.6) 7)  Fatty Liver Disease Via Chest Ct  (ICD-571.8) 8)  Prostatitis, Chronic  (ICD-601.1) 9)  Benign Prostatic Hypertrophy  (ICD-600.00) 10)  Depression  (ICD-311) 11)  Gerd  (ICD-530.81) 12)  Hyperlipidemia  (ICD-272.4) 13)  Carpal Tunnel Syndrome, Bilateral  (ICD-354.0) 14)  Lateral Epicondylitis, Bilateral  (ICD-726.32) 15)  Syndrome, Chronic Pain Due To L/s Back Dz  (ICD-338.4) 16)  Disorder, Tobacco Use  (ICD-305.1) 17)  Anxiety  (ICD-300.00) 18)  Hypothyroidism  (ICD-244.9)  Medications Prior to Update: 1)  Nexium 40 Mg Cpdr (Esomeprazole Magnesium) .... Take 1 Capsule By  Mouth Once A Day 2)  Topamax 100 Mg Tabs (Topiramate) .... Take 1 Tablet By Mouth At Bedtime 3)  Provigil 200 Mg Tabs (Modafinil) .... Take Tablet By Mouth Twice A Day 4)  Bupropion Hcl 300 Mg Tb24 (Bupropion Hcl) .... Take 1 Tablet By Mouth Once A Day 5)  Synthroid 150 Mcg Tabs (Levothyroxine Sodium) .... One Tab By Mouth Once Daily 6)  Alprazolam 1 Mg Tabs (Alprazolam) .... Take 1 Tablet By Mouth Every Six Hours 7)  Vitamin E 1000 Unit  Caps (Vitamin E) .... Take One By Mouth Daily 8)  Mens Multivitamin Plus   Tabs (Multiple Vitamins-Minerals) .... Take One By Mouth Daily 9)  Stool Softener 10)  Citalopram Hydrobromide 20 Mg  Tabs (Citalopram Hydrobromide) .Marland Kitchen.. 1 Daily 11)  Cytomel 25 Mcg  Tabs (Liothyronine Sodium) .Marland Kitchen.. 1 Tab By Mouth Once Daily 12)  Testosterone Cypionate 200 Mg/ml Oil (Testosterone Cypionate) .... 2 Shots Per Month 13)  Uroxatral 10 Mg Xr24h-Tab (Alfuzosin Hcl) .Marland Kitchen.. 1 Daily By Mouth 14)  Omega 3 1200 Mg Daily .Marland KitchenMarland Kitchen. 1 Daily By Mouth 15)  Vitamin D 500 Iu .Marland KitchenMarland Kitchen. 1 Daily By Mouth 16)  Red Yeast Rice 600 Mg Tabs (Red Yeast Rice Extract) .Marland Kitchen.. 1200 Mg Daily By Mouth 17)  Vitamin B-12 500 Mcg Tabs (Cyanocobalamin) .Marland Kitchen.. 1 Daily By Mouth 18)  Metoprolol Succinate 25 Mg Xr24h-Tab (Metoprolol Succinate) .... One Tab By Mouth 19)  Oxycontin  10 Mg Xr12h-Tab (Oxycodone Hcl) .... Take 1 Tablet By Mouth Three Times A Day  Allergies: 1)  ! Neurontin (Gabapentin) 2)  Amoxicillin (Amoxicillin) 3)  Penicillin G Potassium (Penicillin G Potassium)  Physical Exam  General:  Well-developed,well-nourished,in no acute distress; alert,appropriate and cooperative throughout examination. Head:  Normocephalic and atraumatic without obvious abnormalities. No apparent alopecia or balding. Sinuses NT. Eyes:  Conjunctiva clear bilaterally.  Ears:  External ear exam shows no significant lesions or deformities.  Otoscopic examination reveals clear canals, tympanic membranes are intact bilaterally  without bulging, retraction, inflammation or discharge. Hearing is grossly normal bilaterally. Cerumen in right ear occluding. Nose:  External nasal examination shows no deformity or inflammation. Nasal mucosa are pink and moist without lesions or exudates. Mouth:  Oral mucosa and oropharynx without lesions or exudates.  Teeth in good repair. Neck:  No deformities, masses, or tenderness noted. Tghyroid feels nml w/o nodularity. Lungs:  Normal respiratory effort, chest expands symmetrically. Lungs are clear to auscultation, no crackles or wheezes. Heart:  Normal rate and regular rhythm. S1 and S2 normal without gallop, murmur, click, rub or other extra sounds.   Impression & Recommendations:  Problem # 1:  HYPOTHYROIDISM (ICD-244.9) Labs from Dr [facility]	[place] office...TSH: 0.041 Low (0.45-4.5)       0.05 Low (04/17/2009 8:42:27 AM)                                                 T4Fr 0.75  Low  (0.082-1.77)   0.6  Nml   (04/17/2009 8:42:27 AM)                                                T3Fr 3.0    Nml    (2.0-4.4) His updated medication list for this problem includes:    Synthroid 150 Mcg Tabs (Levothyroxine sodium) ..... One tab by mouth once daily    Cytomel 25 Mcg Tabs (Liothyronine sodium) .Marland Kitchen... 1 tab by mouth once daily I think the situation currentky is that the thyroid is less "reactive" than it has been in the past, poss from burnout, and is operating more "normally" relative to when in reactive phase from autoimmune thyroiditis, and therefore does not need as much replacement as in the past.  Will merely stop Cytomel replacement for now and recheck in 3-6 months. Then reassess at that time.  Problem # 2:  HASHIMOTO'S THYROIDITIS (DR MORYATI) (ICD-245.2) Assessment: Improved Appears to have "burned itself out" and now requires replacement. Will readjust medications.  Complete Medication List: 1)  Nexium 40 Mg Cpdr (Esomeprazole magnesium) .... Take 1 capsule by mouth once a day 2)   Topamax 100 Mg Tabs (Topiramate) .... Take 1 tablet by mouth at bedtime 3)  Provigil 200 Mg Tabs (Modafinil) .... Take tablet by mouth twice a day 4)  Bupropion Hcl 300 Mg Tb24 (Bupropion hcl) .... Take 1 tablet by mouth once a day 5)  Synthroid 150 Mcg Tabs (Levothyroxine sodium) .... One tab by mouth once daily 6)  Alprazolam 1 Mg Tabs (Alprazolam) .... Take 1 tablet by mouth every six hours 7)  Vitamin E 1000 Unit Caps (Vitamin e) .... Take one by mouth daily 8)  Mens Multivitamin Plus Tabs (Multiple  vitamins-minerals) .... Take one by mouth daily 9)  Stool Softener  10)  Citalopram Hydrobromide 20 Mg Tabs (Citalopram hydrobromide) .Marland Kitchen.. 1 daily 11)  Cytomel 25 Mcg Tabs (Liothyronine sodium) .Marland Kitchen.. 1 tab by mouth once daily 12)  Testosterone Cypionate 200 Mg/ml Oil (Testosterone cypionate) .... 2 shots per month (150 mg) 13)  Uroxatral 10 Mg Xr24h-tab (Alfuzosin hcl) .Marland Kitchen.. 1 daily by mouth 14)  Omega 3 1200 Mg Daily  .Marland Kitchen.. 1 daily by mouth 15)  Vitamin D 500 Iu  .Marland Kitchen.. 1 daily by mouth 16)  Red Yeast Rice 600 Mg Tabs (Red yeast rice extract) .Marland Kitchen.. 1200 mg daily by mouth 17)  Vitamin B-12 500 Mcg Tabs (Cyanocobalamin) .Marland Kitchen.. 1 daily by mouth 18)  Metoprolol Succinate 25 Mg Xr24h-tab (Metoprolol succinate) .... One tab by mouth 19)  Oxycontin 10 Mg Xr12h-tab (Oxycodone hcl) .... Take 1 tablet by mouth four times a day  Patient Instructions: 1)  Stop Cytomel. 2)  Get repeat TSH, T4Fr and T3Fr in 3-6 months. 3)  LMOM for pt with this information.  Current Allergies (reviewed today): ! NEURONTIN (GABAPENTIN) AMOXICILLIN (AMOXICILLIN) PENICILLIN G POTASSIUM (PENICILLIN G POTASSIUM)

## 2010-03-26 NOTE — Letter (Signed)
Summary: [facility] / EVAL OF HYPOGONADISM / DR. Arlys John COPE  [facility] / EVAL OF HYPOGONADISM / DR. Arlys John COPE   Imported By: Carin Primrose 05/01/2008 10:50:28  _____________________________________________________________________  External Attachment:    Type:   Image     Comment:   External Document

## 2010-03-26 NOTE — Assessment & Plan Note (Signed)
Summary: PER DR Adyson Vanburen-REFERRAL/CLE   Vital Signs:  Patient Profile:   50 Years Old Male Weight:      200 pounds Temp:     97.6 degrees F tympanic Pulse rate:   80 / minute Pulse rhythm:   regular BP sitting:   100 / 64  (left arm) Cuff size:   regular  Vitals Entered By: Providence Crosby (June 20, 2007 8:59 AM)                 Chief Complaint:  WANTS REFERRAL.  History of Present Illness: Pt wants second opinion for Thyroid disease because still having significantly elevated antibodies. He has developed acne and has significant fatigue and general malaise, all of which has not changed in the time he has been treated for his Hashimoto's Thyroiditis and hypothyroidism. He otherwise is stable, trying to exercise when able and do what he can. He pulled a muscle in his back recently which is bothering his ability to get up and down and initial gait.    Prior Medications Reviewed Using: List Brought by Patient  Current Allergies (reviewed today): ! NEURONTIN (GABAPENTIN) AMOXICILLIN (AMOXICILLIN) PENICILLIN G POTASSIUM (PENICILLIN G POTASSIUM)      Physical Exam  General:     Well-developed,well-nourished,in no acute distress; alert,appropriate and cooperative throughout examination Head:     Normocephalic and atraumatic without obvious abnormalities. No apparent alopecia or balding. Eyes:     Conjunctiva clear bilaterally.  Ears:     External ear exam shows no significant lesions or deformities.  Otoscopic examination reveals clear canals, tympanic membranes are intact bilaterally without bulging, retraction, inflammation or discharge. Hearing is grossly normal bilaterally. Cerumen in right ear occluding. Nose:     External nasal examination shows no deformity or inflammation. Nasal mucosa are pink and moist without lesions or exudates. Mouth:     Oral mucosa and oropharynx without lesions or exudates.  Teeth in good repair. Neck:     No deformities, masses, or  tenderness noted. Chest Wall:     No deformities, masses, tenderness or gynecomastia noted. Lungs:     Normal respiratory effort, chest expands symmetrically. Lungs are clear to auscultation, no crackles or wheezes. Heart:     Normal rate and regular rhythm. S1 and S2 normal without gallop, murmur, click, rub or other extra sounds. Skin:     Mild papular acne throughout facial area.    Impression & Recommendations:  Problem # 1:  HASHIMOTO'S THYROIDITIS (DR MORYATI) (ICD-245.2) Refer to Endocrinology in [place]. Orders: Endocrinology Referral (Endocrine)   Problem # 2:  FATIGUE (ICD-780.79) Presumed due thyroid disease. Orders: Endocrinology Referral (Endocrine)   Problem # 3:  BACK PAIN WITH RADICULOPATHY (ICD-729.2) Assessment: New Into right posterior pelvis. Discussed heat and ice, NSAIDs and exercises.  Complete Medication List: 1)  Nexium 40 Mg Cpdr (Esomeprazole magnesium) .... Take 1 capsule by mouth once a day 2)  Topamax 100 Mg Tabs (Topiramate) .... Take 1 tablet by mouth at bedtime 3)  Provigil 200 Mg Tabs (Modafinil) .... Take tablet by mouth twice a day 4)  Avinza 90 Mg Cp24 (Morphine sulfate beads) .... Take 1 capsule by mouth once a day 5)  Bupropion Hcl 300 Mg Tb24 (Bupropion hcl) .... Take 1 tablet by mouth once a day 6)  Synthroid 175 Mcg Tabs (Levothyroxine sodium) .... One tab by mouth once daily on empty stomach 7)  Alprazolam 1 Mg Tabs (Alprazolam) .... Take 1 tablet by mouth every six hours 8)  Vitamin  E 1000 Unit Caps (Vitamin e) .... Take one by mouth daily 9)  Mens Multivitamin Plus Tabs (Multiple vitamins-minerals) .... Take one by mouth daily 10)  Stool Softener  11)  Testim !%  .Marland Kitchen.. 1 tube daily 12)  Oxygen 2 Liters At [facility]  13)  Citalopram Hydrobromide 20 Mg Tabs (Citalopram hydrobromide) .Marland Kitchen.. 1 daily 14)  Cytomel 25 Mcg Tabs (Liothyronine sodium) .Marland Kitchen.. 1 daily   Patient Instructions: 1)  Refer nto Endocrinology    ]

## 2010-03-26 NOTE — Consult Note (Signed)
Summary: [facility]Dr. Welton Flakes   Imported By: Eleonore Chiquito 05/01/2007 15:23:55  _____________________________________________________________________  External Attachment:    Type:   Image     Comment:   External Document

## 2010-03-26 NOTE — Progress Notes (Signed)
   Phone Note Outgoing Call   Call placed by: Providence Crosby,  February 01, 2007 11:03 AM Call placed to: Patient Summary of Call: rx called into pharmacy for synthroid 175 micrograms 1 once daily with#30 with 12 refills [facility] at [phone]/past calling patient about blood test and making appointment for pat to get thyroid checked in 3 months 05/02/2007 at 8:30 am Initial call taken by: Providence Crosby,  February 01, 2007 11:07 AM

## 2010-03-26 NOTE — Consult Note (Signed)
Summary: Consultation Report  Consultation Report   Imported By: Eleonore Chiquito 09/26/2006 15:42:52  _____________________________________________________________________  External Attachment:    Type:   Image     Comment:   External Document

## 2010-03-26 NOTE — Letter (Signed)
Summary: [facility] / EVAL OF HYPOGONADISM / DR. Arlys John COPE  [facility] / EVAL OF HYPOGONADISM / DR. Arlys John COPE   Imported By: Carin Primrose 11/11/2008 12:08:57  _____________________________________________________________________  External Attachment:    Type:   Image     Comment:   External Document

## 2010-03-26 NOTE — Assessment & Plan Note (Signed)
Summary: ? Shingles/NT   Vital Signs:  Patient profile:   50 year old male Height:      71 inches Weight:      202.38 pounds BMI:     28.33 Temp:     98.4 degrees F oral Pulse rate:   84 / minute Pulse rhythm:   regular BP sitting:   104 / 76  (left arm) Cuff size:   large  Vitals Entered By: Delilah Shan CMA Duncan Dull) (March 21, 2009 11:02 AM) CC: ? shingles   History of Present Illness: 50 yo male presents with painful/itchy rash on left arm.  Has been under a lot of stress lately. His aunt just died, was around his nephew who had shingles. His friend's mother is dying.  The rash started a few days ago, on his wrist.  Was tingly before it appeared.  Now it stings a little but is mainly itchy.  Has similar spot a little higher on his left arm. Never had anything like this before. Did have prodrome headahce and malaise but was not sure if that was due to stress or related to rash.  Has not had any changes in soaps, no contact with plants or other possible allergens.  Current Medications (verified): 1)  Nexium 40 Mg Cpdr (Esomeprazole Magnesium) .... Take 1 Capsule By Mouth Once A Day 2)  Topamax 100 Mg Tabs (Topiramate) .... Take 1 Tablet By Mouth At Bedtime 3)  Provigil 200 Mg Tabs (Modafinil) .... Take Tablet By Mouth Twice A Day 4)  Bupropion Hcl 300 Mg Tb24 (Bupropion Hcl) .... Take 1 Tablet By Mouth Once A Day 5)  Synthroid 175 Mcg  Tabs (Levothyroxine Sodium) .... One Tab By Mouth Once Daily On Empty Stomach 6)  Alprazolam 1 Mg Tabs (Alprazolam) .... Take 1 Tablet By Mouth Every Six Hours 7)  Vitamin E 1000 Unit  Caps (Vitamin E) .... Take One By Mouth Daily 8)  Mens Multivitamin Plus   Tabs (Multiple Vitamins-Minerals) .... Take One By Mouth Daily 9)  Stool Softener 10)  Citalopram Hydrobromide 20 Mg  Tabs (Citalopram Hydrobromide) .Marland Kitchen.. 1 Daily 11)  Cytomel 25 Mcg  Tabs (Liothyronine Sodium) .Marland Kitchen.. 1 Tab By Mouth Once Daily 12)  Testosterone Cypionate 200 Mg/ml Oil  (Testosterone Cypionate) .... 2 Shots Per Month 13)  Uroxatral 10 Mg Xr24h-Tab (Alfuzosin Hcl) .Marland Kitchen.. 1 Daily By Mouth 14)  Percocet 10-325 Mg Tabs (Oxycodone-Acetaminophen) .... Every 4 To 6 Hours As Needed Pain By Mouth 15)  Omega 3 1200 Mg Daily .Marland KitchenMarland Kitchen. 1 Daily By Mouth 16)  Vitamin D 500 Iu .Marland KitchenMarland Kitchen. 1 Daily By Mouth 17)  Red Yeast Rice 600 Mg Tabs (Red Yeast Rice Extract) .Marland Kitchen.. 1200 Mg Daily By Mouth 18)  Vitamin B-12 500 Mcg Tabs (Cyanocobalamin) .Marland Kitchen.. 1 Daily By Mouth 19)  Metoprolol Succinate 25 Mg Xr24h-Tab (Metoprolol Succinate) .... One Tab By Mouth 20)  Valtrex 1 Gm Tabs (Valacyclovir Hcl) .Marland Kitchen.. 1 Tab By Mouth Three Times A Day X 7 Days. 21)  Lidex 0.05 % Crea (Fluocinonide) .... Apply To Affected Area 2-4 Times A Day As Needed Itching  Allergies (verified): 1)  ! Neurontin (Gabapentin) 2)  Amoxicillin (Amoxicillin) 3)  Penicillin G Potassium (Penicillin G Potassium)  Review of Systems      See HPI General:  Denies chills and fever. Derm:  Complains of itching and rash.  Physical Exam  General:  Well-developed,well-nourished,in no acute distress; alert,appropriate and cooperative throughout examination, tearful Skin:  raised erythematous macules and papules  on left wrist and forearm  Psych:  normally interactive and good eye contact, tearful and anxious.     Impression & Recommendations:  Problem # 1:  SHINGLES (ICD-053.9) Assessment New Time spent with patient 25 minutes, more than 50% of this time was spent discussing course of shingles.  Did recommend not getting too close to his friend's mother who is immunocompromised. Will treat with Valtrex as he may still be within window. Also given topical lidex for itching and inflammation. Advised to follow up with me on Monday if worsened.  Complete Medication List: 1)  Nexium 40 Mg Cpdr (Esomeprazole magnesium) .... Take 1 capsule by mouth once a day 2)  Topamax 100 Mg Tabs (Topiramate) .... Take 1 tablet by mouth at bedtime 3)   Provigil 200 Mg Tabs (Modafinil) .... Take tablet by mouth twice a day 4)  Bupropion Hcl 300 Mg Tb24 (Bupropion hcl) .... Take 1 tablet by mouth once a day 5)  Synthroid 175 Mcg Tabs (Levothyroxine sodium) .... One tab by mouth once daily on empty stomach 6)  Alprazolam 1 Mg Tabs (Alprazolam) .... Take 1 tablet by mouth every six hours 7)  Vitamin E 1000 Unit Caps (Vitamin e) .... Take one by mouth daily 8)  Mens Multivitamin Plus Tabs (Multiple vitamins-minerals) .... Take one by mouth daily 9)  Stool Softener  10)  Citalopram Hydrobromide 20 Mg Tabs (Citalopram hydrobromide) .Marland Kitchen.. 1 daily 11)  Cytomel 25 Mcg Tabs (Liothyronine sodium) .Marland Kitchen.. 1 tab by mouth once daily 12)  Testosterone Cypionate 200 Mg/ml Oil (Testosterone cypionate) .... 2 shots per month 13)  Uroxatral 10 Mg Xr24h-tab (Alfuzosin hcl) .Marland Kitchen.. 1 daily by mouth 14)  Percocet 10-325 Mg Tabs (Oxycodone-acetaminophen) .... Every 4 to 6 hours as needed pain by mouth 15)  Omega 3 1200 Mg Daily  .Marland Kitchen.. 1 daily by mouth 16)  Vitamin D 500 Iu  .Marland Kitchen.. 1 daily by mouth 17)  Red Yeast Rice 600 Mg Tabs (Red yeast rice extract) .Marland Kitchen.. 1200 mg daily by mouth 18)  Vitamin B-12 500 Mcg Tabs (Cyanocobalamin) .Marland Kitchen.. 1 daily by mouth 19)  Metoprolol Succinate 25 Mg Xr24h-tab (Metoprolol succinate) .... One tab by mouth 20)  Valtrex 1 Gm Tabs (Valacyclovir hcl) .Marland Kitchen.. 1 tab by mouth three times a day x 7 days. 21)  Lidex 0.05 % Crea (Fluocinonide) .... Apply to affected area 2-4 times a day as needed itching Prescriptions: LIDEX 0.05 % CREA (FLUOCINONIDE) Apply to affected area 2-4 times a day as needed itching  #30 g x 0   Entered by:   Delilah Shan CMA (AAMA)   Authorized by:   Ruthe Mannan MD   Signed by:   Delilah Shan CMA (AAMA) on 03/21/2009   Method used:   Electronically to        [facility]. [address] [phone]* (retail)       [address] [place], [place]  [id number]       Ph: [phone]       Fax: [phone]   RxID:    [id number] VALTREX 1 GM TABS (VALACYCLOVIR HCL) 1 tab by mouth three times a day x 7 days.  #21 x 0   Entered by:   Delilah Shan CMA (AAMA)   Authorized by:   Ruthe Mannan MD   Signed by:   Delilah Shan CMA (AAMA) on 03/21/2009   Method used:   Electronically to        [facility]  [facility] [phone]* (retail)       [address] [place], [place]  [id number]       Ph: [phone]       Fax: [phone]   RxID:   [id number] LIDEX 0.05 % CREA (FLUOCINONIDE) Apply to affected area 2-4 times a day as needed itching  #30 g x 0   Entered and Authorized by:   Ruthe Mannan MD   Signed by:   Ruthe Mannan MD on 03/21/2009   Method used:   Electronically to        [facility] [phone]* (retail)       [address] [place], [place]  [address]       Ph: [phone] or [phone]       Fax: [phone]   RxID:   [id number] VALTREX 1 GM TABS (VALACYCLOVIR HCL) 1 tab by mouth three times a day x 7 days.  #21 x 0   Entered and Authorized by:   Ruthe Mannan MD   Signed by:   Ruthe Mannan MD on 03/21/2009   Method used:   Electronically to        [facility] [phone]* (retail)       [address] [place], [place]  [address]       Ph: [phone] or [phone]       Fax: [phone]   RxID:   [id number]

## 2010-03-26 NOTE — Progress Notes (Signed)
Summary: Rx. "Kwell"  Phone Note Call from Patient Call back at  [phone]  Ext [phone]    Caller: Patient Call For: Dr. Hetty Ely Summary of Call: See previous phone note. Patient did try to get "Kwell" OTC but the pharmacist says it is not OTC.  Patient is now experiencing symptoms, little bumps around ankles, waking up itching, etc.  He says the literature that he has read says there is a 3-6 week incubation period and it has now been 3 weeks.  Please send Rx. to [facility], [place].  Patient can be reached at the above number and it is fine to leave a message if you don't get him personally per patient .  His VM is private. Initial call taken by: Delilah Shan,  August 21, 2007 9:06 AM  Follow-up for Phone Call        Script done. Follow-up by: Shaune Leeks MD,  August 21, 2007 9:12 AM  Additional Follow-up for Phone Call Additional follow up Details #1::        PRESCRIBTION CALLED TO [facility] AND LEFT MESSAGE ON MACHINE FOR PATIENT Additional Follow-up by: Providence Crosby,  August 21, 2007 2:20 PM    New/Updated Medications: PERMETHRIN 5 %  CREA (PERMETHRIN) applyneck to toes including under nails and repeated in one week. Ex  Prescriptions: PERMETHRIN 5 %  CREA (PERMETHRIN) applyneck to toes including under nails and repeated in one week.  #1 container x 1   Entered and Authorized by:   Shaune Leeks MD   Signed by:   Shaune Leeks MD on 08/21/2007   Method used:   Print then Give to Patient   RxID:   [id number]

## 2010-03-26 NOTE — Progress Notes (Signed)
Summary: Need's Referral to [facility] for Thyroid 2nd opinion  Phone Note Call from Patient Call back at [phone]   Caller: Patient Summary of Call: Pt went to [facility]/Dr. Patrecia Pace and says the Dr. Patrecia Pace told him to follow up in 6 to 12 months. Pt is not satisfied. Says all his tests have come back w/his thyroid through the roof. Pt states he is not feeling well, tired all the time. He states he does not trust Dr. Patrecia Pace. He thinks he may have Hashimoto's Disease. So he called [facility] to get a 2nd opinion but they won't give him an appt. They told him he MUST be referred by his primary Doctor. They would not even give him a name of a Dr. He will get first available. Please put in order for referral to [facility].  Initial call taken by: Mickle Asper,  June 16, 2007 9:42 AM  Follow-up for Phone Call        needs to be seen. Follow-up by: Shaune Leeks MD,  June 16, 2007 12:34 PM  Additional Follow-up for Phone Call Additional follow up Details #1::        left message on machine at home number to call office for appt. Additional Follow-up by: Providence Crosby,  June 16, 2007 12:41 PM    Additional Follow-up for Phone Call Additional follow up Details #2::    PATIENT HAS MADE AN APPOINTMENT TO DISCUSS REFERRAL Follow-up by: Providence Crosby,  June 19, 2007 9:57 AM

## 2010-03-26 NOTE — Assessment & Plan Note (Signed)
Summary: CPX/RBH   Vital Signs:  Patient profile:   50 year old male Height:      71 inches Weight:      205.75 pounds BMI:     28.80 Temp:     98.3 degrees F oral Pulse rate:   80 / minute Pulse rhythm:   regular BP sitting:   110 / 80  (left arm) Cuff size:   large  Vitals Entered By: Delilah Shan CMA Duncan Dull) (April 22, 2009 2:00 PM) CC: CPX   History of Present Illness: Pt here for Comp Exam. He has gained 2 pounds since beginning of Jan. He had episode of shingles in mid Jan.  His BP has really improved lately. Hids pulse has improved as well, the head sensations have decreased also.  He has no complaints. His meds were recently changed from Percocet to Oxycontin. He requirse frequentr adjustment of his stimulator. He is to see a service technitian to evaluate him.  Preventive Screening-Counseling & Management  Alcohol-Tobacco     Alcohol drinks/day: <1     Alcohol type: red or white wine every 2 weeks.     Smoking Status: quit     Packs/Day: 09/2005 25pyh  Caffeine-Diet-Exercise     Caffeine use/day: 2     Does Patient Exercise: yes     Type of exercise: gym     Exercise (avg: min/session): 30-60     Times/week: 3  Problems Prior to Update: 1)  Shingles  (ICD-053.9) 2)  Essential Hypertension  (ICD-401.9) 3)  Back Pain With Radiculopathy  (ICD-729.2) 4)  Hashimoto's Thyroiditis (DR MORYATI)  (ICD-245.2) 5)  Fatigue  (ICD-780.79) 6)  Hyperglycemia  (ICD-790.6) 7)  Fatty Liver Disease Via Chest Ct  (ICD-571.8) 8)  Prostatitis, Chronic  (ICD-601.1) 9)  Benign Prostatic Hypertrophy  (ICD-600.00) 10)  Depression  (ICD-311) 11)  Gerd  (ICD-530.81) 12)  Hyperlipidemia  (ICD-272.4) 13)  Carpal Tunnel Syndrome, Bilateral  (ICD-354.0) 14)  Lateral Epicondylitis, Bilateral  (ICD-726.32) 15)  Syndrome, Chronic Pain Due To L/s Back Dz  (ICD-338.4) 16)  Disorder, Tobacco Use  (ICD-305.1) 17)  Anxiety  (ICD-300.00) 18)  Hypothyroidism  (ICD-244.9)  Medications Prior to  Update: 1)  Nexium 40 Mg Cpdr (Esomeprazole Magnesium) .... Take 1 Capsule By Mouth Once A Day 2)  Topamax 100 Mg Tabs (Topiramate) .... Take 1 Tablet By Mouth At Bedtime 3)  Provigil 200 Mg Tabs (Modafinil) .... Take Tablet By Mouth Twice A Day 4)  Bupropion Hcl 300 Mg Tb24 (Bupropion Hcl) .... Take 1 Tablet By Mouth Once A Day 5)  Synthroid 175 Mcg  Tabs (Levothyroxine Sodium) .... One Tab By Mouth Once Daily On Empty Stomach 6)  Alprazolam 1 Mg Tabs (Alprazolam) .... Take 1 Tablet By Mouth Every Six Hours 7)  Vitamin E 1000 Unit  Caps (Vitamin E) .... Take One By Mouth Daily 8)  Mens Multivitamin Plus   Tabs (Multiple Vitamins-Minerals) .... Take One By Mouth Daily 9)  Stool Softener 10)  Citalopram Hydrobromide 20 Mg  Tabs (Citalopram Hydrobromide) .Marland Kitchen.. 1 Daily 11)  Cytomel 25 Mcg  Tabs (Liothyronine Sodium) .Marland Kitchen.. 1 Tab By Mouth Once Daily 12)  Testosterone Cypionate 200 Mg/ml Oil (Testosterone Cypionate) .... 2 Shots Per Month 13)  Uroxatral 10 Mg Xr24h-Tab (Alfuzosin Hcl) .Marland Kitchen.. 1 Daily By Mouth 14)  Percocet 10-325 Mg Tabs (Oxycodone-Acetaminophen) .... Every 4 To 6 Hours As Needed Pain By Mouth 15)  Omega 3 1200 Mg Daily .Marland KitchenMarland Kitchen. 1 Daily By Mouth 16)  Vitamin D 500 Iu .Marland KitchenMarland Kitchen. 1 Daily By Mouth 17)  Red Yeast Rice 600 Mg Tabs (Red Yeast Rice Extract) .Marland Kitchen.. 1200 Mg Daily By Mouth 18)  Vitamin B-12 500 Mcg Tabs (Cyanocobalamin) .Marland Kitchen.. 1 Daily By Mouth 19)  Metoprolol Succinate 25 Mg Xr24h-Tab (Metoprolol Succinate) .... One Tab By Mouth 20)  Valtrex 1 Gm Tabs (Valacyclovir Hcl) .Marland Kitchen.. 1 Tab By Mouth Three Times A Day X 7 Days. 21)  Lidex 0.05 % Crea (Fluocinonide) .... Apply To Affected Area 2-4 Times A Day As Needed Itching  Allergies: 1)  ! Neurontin (Gabapentin) 2)  Amoxicillin (Amoxicillin) 3)  Penicillin G Potassium (Penicillin G Potassium)  Past History:  Past Medical History: Last updated: 06/05/2006 Hypothyroidism (02/23/1991) Anxiety (09/23/1999) Hyperlipidemia (04/22/2004) GERD  (04/22/2004) w/ Barretts Esoph Depression (02/23/1999) Benign prostatic hypertrophy (09/22/2005)  Past Surgical History: Last updated: 10/16/2008 Epidurals, Facet injections, Joint injections, Myelogram  Pre 2005 ABD U/S  Fatty Liver 10/05/2001 Colonoscopy int hemms Mechele Collin) 02/2002 Back Surgery bone frag removed (Kritzer) 2004 EGD Barretts, Dilation 05/06/2004 [facility] Atyp pneum  Interstit Pneumonmitis 8/9-8/18/2007 CT Chest [facility]) nml 10/01/2005 CT Abd/Pelvis nml 10/07/2005 CT Chest no PE sm hilar/med LN 11/24/2005 MRI Brain  [facility] Atyp CP,  Depress,  Chronic Pain Syndr, GERD 10/3-10/06/2005 ECHO nml EF 55% 11/25/2005       CT Angio no PE,  Fatty Liver Thyroid Nuclear Scan Nml Uptake, Decr Activity L Inf and Isthmus (Dr Kerrie Pleasure) 03/14/2007 Back Stimulator insertion for chronic back pain 01/2008  Family History: Last updated: 04/22/2009 Father dec 70  Massive MI MI x 5(first 39yoa) CABG x 2 (64yoa) DM Mother dec 67  Emphysema, RAD (Smoker) HTN Osteopor Sister A  49 (3/06) Colon Ca (Stage IV) times two Uterine Ca  Risk Factors: Alcohol Use: <1 (04/22/2009) Caffeine Use: 2 (04/22/2009) Exercise: yes (04/22/2009)  Risk Factors: Smoking Status: quit (04/22/2009) Packs/Day: 09/2005 25pyh (04/22/2009)  Family History: Father dec 70  Massive MI MI x 5(first 39yoa) CABG x 2 (62yoa) DM Mother dec 67  Emphysema, RAD (Smoker) HTN Osteopor Sister A  49 (3/06) Colon Ca (Stage IV) times two Uterine Ca  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 Eyes:  Complains of blurring; denies discharge and eye pain; recent changes, eye exam next week.Marland KitchenMarland KitchenHas Lasik 10 yrs ago.. ENT:  Denies decreased hearing, earache, and ringing in ears. CV:  Complains of palpitations; denies  chest pain or discomfort, fainting, fatigue, shortness of breath with exertion, swelling of feet, and swelling of hands; rare now. Resp:  Complains of shortness of breath and wheezing; denies cough; occas SOB with rare wheezing,,,,since Pneumonia. GI:  Complains of bloody stools and constipation; denies abdominal pain, change in bowel habits, dark tarry stools, diarrhea, indigestion, loss of appetite, nausea, vomiting, vomiting blood, and yellowish skin color; known hemms and hard stools. GU:  Denies discharge, dysuria, nocturia, and urinary frequency. MS:  Complains of joint pain, low back pain, and stiffness; denies cramps and muscle weakness; hip and lower back. Derm:  Complains of dryness, itching, and rash; left arm. Neuro:  Denies numbness, poor balance, tingling, and tremors.  Physical Exam  General:  Well-developed,well-nourished,in no acute distress; alert,appropriate and cooperative throughout examination. Head:  Normocephalic and atraumatic without obvious abnormalities. No apparent alopecia or balding. Sinuses NT. Eyes:  Conjunctiva clear bilaterally.  Ears:  External ear exam shows no significant lesions or deformities.  Otoscopic examination reveals clear canals, tympanic membranes are intact bilaterally without bulging, retraction, inflammation or discharge. Hearing is grossly normal bilaterally. Cerumen in right ear occluding. Nose:  External nasal examination shows no deformity or inflammation. Nasal mucosa are pink and moist without lesions or exudates. Mouth:  Oral mucosa and oropharynx without lesions or exudates.  Teeth in good repair. Neck:  No deformities, masses, or tenderness noted. Chest Wall:  No deformities, masses, tenderness or gynecomastia noted. Breasts:  No masses or gynecomastia noted Lungs:  Normal respiratory effort, chest expands symmetrically. Lungs are clear to auscultation, no crackles or wheezes. Heart:  Normal rate and regular rhythm. S1 and S2 normal  without gallop, murmur, click, rub or other extra sounds. Abdomen:  Bowel sounds positive,abdomen soft and non-tender without masses, organomegaly or hernias noted. Rectal:  Deferred to Dr Joseph Berkshire:  Testes bilaterally descended without nodularity, tenderness or masses. No scrotal masses or lesions. No penis lesions or urethral discharge. Prostate:  Deffered to Dr Achilles Dunk. Msk:  No deformity or scoliosis noted of thoracic or lumbar spine.   Pulses:  R and L carotid,radial,femoral,dorsalis pedis and posterior tibial pulses are full and equal bilaterally Extremities:  No clubbing, cyanosis, edema, or deformity noted with normal full range of motion of all joints.   Neurologic:  No cranial nerve deficits noted. Station and gait are normal. Sensory, motor and coordinative functions appear intact. Skin:  Intact without suspicious lesions or rashes except resolving vessicular rash of shingles oj left wrist. Cervical Nodes:  No lymphadenopathy noted Inguinal Nodes:  No significant adenopathy Psych:  Cognition and judgment appear intact. Alert and cooperative with normal attention span and concentration. No apparent delusions, illusions, hallucinations, good eye contact, labile mood.   Impression & Recommendations:  Problem # 1:  HEALTH MAINTENANCE EXAM (ICD-V70.0) Assessment Comment Only  Problem # 2:  ESSENTIAL HYPERTENSION (ICD-401.9) Assessment: Improved Cont curr meds. His updated medication list for this problem includes:    Metoprolol Succinate 25 Mg Xr24h-tab (Metoprolol succinate) ..... One tab by mouth  BP today: 110/80 Prior BP: 104/76 (03/21/2009)  Labs Reviewed: K+: 3.7 (04/17/2009) Creat: : 1.2 (04/17/2009)   Chol: 179 (04/17/2009)   HDL: 39.90 (04/17/2009)   LDL: 80 (03/04/2006)   TG: 249.0 (04/17/2009)  Problem # 3:  BACK PAIN WITH RADICULOPATHY (ICD-729.2) Assessment: Unchanged Stable with stimulator.  Problem # 4:  HYPERGLYCEMIA (ICD-790.6) Assessment:  Improved Euglycemic today.  Problem # 5:  FATTY LIVER DISEASE VIA CHEST CT (ICD-571.8) Assessment: Unchanged Nml this tear.  Problem # 6:  DEPRESSION (ICD-311) Assessment: Improved  Doing well at present. His updated medication list for this problem includes:    Bupropion Hcl 300 Mg Tb24 (Bupropion hcl) .Marland Kitchen... Take 1 tablet by mouth once a day    Alprazolam 1 Mg Tabs (Alprazolam) .Marland Kitchen... Take 1 tablet by mouth every six hours    Citalopram Hydrobromide 20 Mg Tabs (Citalopram hydrobromide) .Marland Kitchen... 1 daily  Problem # 7:  HYPERLIPIDEMIA (ICD-272.4) Adequate excepT Trigs,. Will follow. Labs Reviewed: SGOT: 20 (04/17/2009)   SGPT: 20 (04/17/2009)   HDL:39.90 (04/17/2009), 42.8 (03/04/2006)  LDL:80 (03/04/2006)  Chol:179 (04/17/2009), 138 (03/04/2006)  Trig:249.0 (04/17/2009), 75 (03/04/2006)  Problem # 8:  ANXIETY (ICD-300.00) Assessment: Unchanged  Stable. His updated medication list for this problem includes:    Bupropion Hcl 300 Mg Tb24 (Bupropion hcl) .Marland Kitchen... Take 1 tablet by mouth once a day    Alprazolam 1 Mg Tabs (Alprazolam) .Marland Kitchen... Take  1 tablet by mouth every six hours    Citalopram Hydrobromide 20 Mg Tabs (Citalopram hydrobromide) .Marland Kitchen... 1 daily  Discussed medication use and relaxation techniques.   Problem # 9:  HYPOTHYROIDISM (ICD-244.9) Decrease synthroid to . recheck in Oct. His updated medication list for this problem includes:    Synthroid 150 Mcg Tabs (Levothyroxine sodium) ..... One tab by mouth once daily    Cytomel 25 Mcg Tabs (Liothyronine sodium) .Marland Kitchen... 1 tab by mouth once daily  Labs Reviewed: TSH: 0.05 (04/17/2009)    Chol: 179 (04/17/2009)   HDL: 39.90 (04/17/2009)   LDL: 80 (03/04/2006)   TG: 249.0 (04/17/2009)  Complete Medication List: 1)  Nexium 40 Mg Cpdr (Esomeprazole magnesium) .... Take 1 capsule by mouth once a day 2)  Topamax 100 Mg Tabs (Topiramate) .... Take 1 tablet by mouth at bedtime 3)  Provigil 200 Mg Tabs (Modafinil) .... Take tablet by  mouth twice a day 4)  Bupropion Hcl 300 Mg Tb24 (Bupropion hcl) .... Take 1 tablet by mouth once a day 5)  Synthroid 150 Mcg Tabs (Levothyroxine sodium) .... One tab by mouth once daily 6)  Alprazolam 1 Mg Tabs (Alprazolam) .... Take 1 tablet by mouth every six hours 7)  Vitamin E 1000 Unit Caps (Vitamin e) .... Take one by mouth daily 8)  Mens Multivitamin Plus Tabs (Multiple vitamins-minerals) .... Take one by mouth daily 9)  Stool Softener  10)  Citalopram Hydrobromide 20 Mg Tabs (Citalopram hydrobromide) .Marland Kitchen.. 1 daily 11)  Cytomel 25 Mcg Tabs (Liothyronine sodium) .Marland Kitchen.. 1 tab by mouth once daily 12)  Testosterone Cypionate 200 Mg/ml Oil (Testosterone cypionate) .... 2 shots per month 13)  Uroxatral 10 Mg Xr24h-tab (Alfuzosin hcl) .Marland Kitchen.. 1 daily by mouth 14)  Omega 3 1200 Mg Daily  .Marland Kitchen.. 1 daily by mouth 15)  Vitamin D 500 Iu  .Marland Kitchen.. 1 daily by mouth 16)  Red Yeast Rice 600 Mg Tabs (Red yeast rice extract) .Marland Kitchen.. 1200 mg daily by mouth 17)  Vitamin B-12 500 Mcg Tabs (Cyanocobalamin) .Marland Kitchen.. 1 daily by mouth 18)  Metoprolol Succinate 25 Mg Xr24h-tab (Metoprolol succinate) .... One tab by mouth 19)  Oxycontin 10 Mg Xr12h-tab (Oxycodone hcl) .... Take 1 tablet by mouth three times a day Prescriptions: SYNTHROID 150 MCG TABS (LEVOTHYROXINE SODIUM) one tab by mouth once daily  #30 x 12   Entered and Authorized by:   Shaune Leeks MD   Signed by:   Shaune Leeks MD on 04/22/2009   Method used:   Electronically to        [facility]. [address] [phone]* (retail)       [address] [place], [place]  [id number]       Ph: [phone]       Fax: [phone]   RxID:   [id number]   Prior Medications: NEXIUM 40 MG CPDR (ESOMEPRAZOLE MAGNESIUM) Take 1 capsule by mouth once a day TOPAMAX 100 MG TABS (TOPIRAMATE) Take 1 tablet by mouth at bedtime PROVIGIL 200 MG TABS (MODAFINIL) Take tablet by mouth twice a day BUPROPION HCL 300 MG TB24 (BUPROPION HCL) Take 1 tablet by mouth once a  day ALPRAZOLAM 1 MG TABS (ALPRAZOLAM) Take 1 tablet by mouth every six hours VITAMIN E 1000 UNIT  CAPS (VITAMIN E) Take one by mouth daily MENS MULTIVITAMIN PLUS   TABS (MULTIPLE VITAMINS-MINERALS) Take one by mouth daily STOOL SOFTENER ()  CITALOPRAM HYDROBROMIDE 20 MG  TABS (CITALOPRAM HYDROBROMIDE)  1 DAILY CYTOMEL 25 MCG  TABS (LIOTHYRONINE SODIUM) 1 tab by mouth once daily TESTOSTERONE CYPIONATE 200 MG/ML OIL (TESTOSTERONE CYPIONATE) 2 shots per month UROXATRAL 10 MG XR24H-TAB (ALFUZOSIN HCL) 1 daily by mouth OMEGA 3 1200 MG DAILY () 1 daily by mouth VITAMIN D 500 IU () 1 daily by mouth RED YEAST RICE 600 MG TABS (RED YEAST RICE EXTRACT) 1200 mg daily by mouth VITAMIN B-12 500 MCG TABS (CYANOCOBALAMIN) 1 daily by mouth METOPROLOL SUCCINATE 25 MG XR24H-TAB (METOPROLOL SUCCINATE) one tab by mouth OXYCONTIN 10 MG XR12H-TAB (OXYCODONE HCL) Take 1 tablet by mouth three times a day Current Allergies: ! NEURONTIN (GABAPENTIN) AMOXICILLIN (AMOXICILLIN) PENICILLIN G POTASSIUM (PENICILLIN G POTASSIUM)

## 2010-03-26 NOTE — Consult Note (Signed)
Summary: [facility]/Dr. Achilles Dunk  [facility]/Dr. Achilles Dunk   Imported By: Eleonore Chiquito 10/31/2007 11:20:09  _____________________________________________________________________  External Attachment:    Type:   Image     Comment:   External Document

## 2010-03-26 NOTE — Consult Note (Signed)
Summary: Evaluation for Hypersomnolence/Dr. Welton Flakes  Evaluation for Hypersomnolence/Dr. Welton Flakes   Imported By: Eleonore Chiquito 11/14/2007 10:58:38  _____________________________________________________________________  External Attachment:    Type:   Image     Comment:   External Document

## 2010-03-26 NOTE — Assessment & Plan Note (Signed)
Summary: FATIGUE   Vital Signs:  Patient Profile:   50 Years Old Male Weight:      199 pounds Temp:     98.6 degrees F oral Pulse rate:   72 / minute Pulse rhythm:   regular BP sitting:   100 / 70  (left arm) Cuff size:   regular  Vitals Entered By: Providence Crosby (October 31, 2006 2:30 PM)                 Chief Complaint:  C/O FATIQUE.  History of Present Illness: Has gained weight, hair thinnig, skin dry. Motrher passed away 08/10/22 from COPD, Father 12/07 from MI. Has been tired lately as well...does not think he is depressed. Sees psych fairly regularly, there about two weeks ago.  Current Allergies (reviewed today): ! NEURONTIN (GABAPENTIN) AMOXICILLIN (AMOXICILLIN) PENICILLIN G POTASSIUM (PENICILLIN G POTASSIUM)   Family History:    Father dec 70  Massive MI MI x 5(first 39yoa) CABG x 2 (64yoa) DM    Mother dec40  Emphysema, RAD (Smoker) HTN Osteopor    Sister alive 73 (3/06) Colon Ca (Stage IV)     Physical Exam  General:     Well-developed,well-nourished,in no acute distress; alert,appropriate and cooperative throughout examination Head:     Normocephalic and atraumatic without obvious abnormalities. No apparent alopecia or balding. Eyes:     Conjunctiva clear bilaterally.  Ears:     External ear exam shows no significant lesions or deformities.  Otoscopic examination reveals clear canals, tympanic membranes are intact bilaterally without bulging, retraction, inflammation or discharge. Hearing is grossly normal bilaterally. Nose:     External nasal examination shows no deformity or inflammation. Nasal mucosa are pink and moist without lesions or exudates. Mouth:     Oral mucosa and oropharynx without lesions or exudates.  Teeth in good repair. Neck:     No deformities, masses, or tenderness noted. Chest Wall:     No deformities, masses, tenderness or gynecomastia noted. Lungs:     Normal respiratory effort, chest expands symmetrically. Lungs are clear  to auscultation, no crackles or wheezes. Heart:     Normal rate and regular rhythm. S1 and S2 normal without gallop, murmur, click, rub or other extra sounds. Abdomen:     Bowel sounds positive,abdomen soft and non-tender without masses, organomegaly or hernias noted.    Impression & Recommendations:  Problem # 1:  FATIGUE (ICD-780.79) Assessment: New On multiple meds which can do this...due to skin changes,etc. will screen for anemia and hypothyroidism. Orders: TLB-B12 + Folate Pnl ([id number]-B12/FOL) TLB-CBC Platelet - w/Differential (85025-CBCD) TLB-Iron, (Fe) Total (83540-FE) TLB-TSH (Thyroid Stimulating Hormone) (84443-TSH) TLB-T4 (Thyrox), Free [phone]) TLB-BMP (Basic Metabolic Panel-BMET) (80048-METABOL) Venipuncture (91478)   Problem # 2:  HYPERGLYCEMIA (ICD-790.6) Assessment: Unchanged Will check glucose and renal function, last food approx five hours ago.  Problem # 3:  FATTY LIVER DISEASE VIA CHEST CT (ICD-571.8) Assessment: Unchanged Check liver panel. Orders: TLB-Hepatic/Liver Function Pnl (80076-HEPATIC)   Complete Medication List: 1)  Nexium 40 Mg Cpdr (Esomeprazole magnesium) .... Take 1 capsule by mouth once a day 2)  Lipitor 10 Mg Tabs (Atorvastatin calcium) .... Take 1 tablet by mouth at bedtime 3)  Topamax 100 Mg Tabs (Topiramate) .... Take 1 tablet by mouth at bedtime 4)  Provigil 200 Mg Tabs (Modafinil) .... Take tablet by mouth twice a day 5)  Avinza 90 Mg Cp24 (Morphine sulfate beads) .... Take 1 capsule by mouth once a day 6)  Bupropion Hcl 300 Mg  Tb24 (Bupropion hcl) .... Take 1 tablet by mouth once a day 7)  Klor-con M20 20 Meq Tbcr (Potassium chloride crys cr) .... Take 1 meq by mouth once a day 8)  Synthroid 137 Mcg Tabs (Levothyroxine sodium) .... Take one by mouth daily 9)  Alprazolam 1 Mg Tabs (Alprazolam) .... Take 1 tablet by mouth every six hours 10)  Furosemide 20 Mg Tabs (Furosemide) .... Take 1 tablet by mouth twice a day 11)   Metoclopramide Hcl 10 Mg Tabs (Metoclopramide hcl) .... Take 1 tablet by mouth four times a day 12)  Flomax 0.4 Mg Cp24 (Tamsulosin hcl) .... Take 1 capsule by mouth once a day 13)  Vitamin E 1000 Unit Caps (Vitamin e) .... Take one by mouth daily 14)  Mens Multivitamin Plus Tabs (Multiple vitamins-minerals) .... Take one by mouth daily 15)  Stool Softener  16)  Testin 1 Mg.  .... Take one by mouth daily 17)  Oxygen 2 Liters At [facility]   Other Orders: Venipuncture (52841)   Patient Instructions: 1)  Congrats ...about a year off cigarettes!!! 2)  RTC as needed . Will report results on phone tree unless f/u is needed.

## 2010-03-26 NOTE — Consult Note (Signed)
Summary: [facility]/Dr. Valda Lamb [facility]/Dr. Patrecia Pace   Imported By: Eleonore Chiquito 06/05/2007 10:36:52  _____________________________________________________________________  External Attachment:    Type:   Image     Comment:   External Document

## 2010-03-26 NOTE — Letter (Signed)
Summary: [place] No Show Letter  [place] [facility]  [address] [place], [place] [address]   Phone: [phone]  Fax: [phone]    09/06/2006   Dear Randy Mclean,   Our records indicate that you missed your scheduled appointment with __DR. SCHALLER___________________ on  AT 8:15 AM____________.  Please contact this office to reschedule your appointment as soon as possible.  It is important that you keep your scheduled appointments with your physician, so we can provide you the best care possible.  Please be advised that there may be a charge for "no show" appointments.    Sincerely,   [place] [facility]

## 2010-03-26 NOTE — Consult Note (Signed)
Summary: [place] N[facility]LE[facility] / F/U ON THYROID LABS / DR. Doreatha Martin MORAYATI  [place] N[facility]LE[facility] / F/U ON THYROID LABS / DR. Doreatha Martin MORAYATI   Imported By: Carin Primrose 02/09/2008 11:18:21  _____________________________________________________________________  External Attachment:    Type:   Image     Comment:   External Document

## 2010-03-26 NOTE — Progress Notes (Signed)
Summary: Bupropion  Phone Note Refill Request Message from:  [facility] on October 20, 2009 9:16 AM  Refills Requested: Medication #1:  BUPROPION HCL 300 MG TB24 Take 1 tablet by mouth once a day [employer] [facility] #[id number]*   Last Lenox Ahr Date:  09/07/2009   Pharmacy Phone:  [phone]   Method Requested: Electronic Initial call taken by: Delilah Shan CMA Daivik Overley Dull),  October 20, 2009 9:16 AM  Follow-up for Phone Call       Follow-up by: Crawford Givens MD,  October 20, 2009 1:44 PM    Prescriptions: BUPROPION HCL 300 MG TB24 (BUPROPION HCL) Take 1 tablet by mouth once a day  #30 Tablet x 12   Entered and Authorized by:   Crawford Givens MD   Signed by:   Crawford Givens MD on 10/20/2009   Method used:   Electronically to        [facility]. [address] [phone]* (retail)       [address] [place], [place]  [id number]       Ph: [phone]       Fax: [phone]   RxID:   [id number]

## 2010-03-26 NOTE — Progress Notes (Signed)
Summary: refill budeprion  Phone Note Refill Request Message from:  [facility] on October 14, 2008 9:48 AM  Refills Requested: Medication #1:  BUPROPION HCL 300 MG TB24 Take 1 tablet by mouth once a day   Last Refilled: 08/05/2008 [facility] [address] [phone]  Initial call taken by: Providence Crosby LPN,  October 14, 2008 9:49 AM  Follow-up for Phone Call        Needs to be seen. Follow-up by: Shaune Leeks MD,  October 14, 2008 11:25 AM  Additional Follow-up for Phone Call Additional follow up Details #1::        notified patient appointment made for tommorow at 9:15 am Additional Follow-up by: Providence Crosby LPN,  October 15, 2008 9:17 AM

## 2010-03-26 NOTE — Assessment & Plan Note (Signed)
Summary: F/U BLOOD PRESSURE/CLE   Vital Signs:  Patient profile:   50 year old male Weight:      202.4 pounds BMI:     28.33 Temp:     99.0 degrees F oral Pulse rate:   84 / minute Pulse rhythm:   regular BP sitting:   160 / 94  (left arm) Cuff size:   regular  Vitals Entered By: Linde Gillis CMA Duncan Dull) (February 25, 2009 8:51 AM) CC: follow up blood pressure   History of Present Illness: Pt here for followup of elevated BP. Was 189/98 yesterday. He has been walking, has cut down on ice cream to sugar free or low fat.  He also needs script refill for thyroid meds. He was last seen by Dr Patrecia Pace in 3/10 with labs last 12/09.  He feels well and is doing fine.  Problems Prior to Update: 1)  Elevated Bp Reading Without Dx Hypertension  (ICD-796.2) 2)  Back Pain With Radiculopathy  (ICD-729.2) 3)  Hashimoto's Thyroiditis (DR MORYATI)  (ICD-245.2) 4)  Fatigue  (ICD-780.79) 5)  Hyperglycemia  (ICD-790.6) 6)  Fatty Liver Disease Via Chest Ct  (ICD-571.8) 7)  Prostatitis, Chronic  (ICD-601.1) 8)  Benign Prostatic Hypertrophy  (ICD-600.00) 9)  Depression  (ICD-311) 10)  Gerd  (ICD-530.81) 11)  Hyperlipidemia  (ICD-272.4) 12)  Carpal Tunnel Syndrome, Bilateral  (ICD-354.0) 13)  Lateral Epicondylitis, Bilateral  (ICD-726.32) 14)  Syndrome, Chronic Pain Due To L/s Back Dz  (ICD-338.4) 15)  Disorder, Tobacco Use  (ICD-305.1) 16)  Anxiety  (ICD-300.00) 17)  Hypothyroidism  (ICD-244.9)  Medications Prior to Update: 1)  Nexium 40 Mg Cpdr (Esomeprazole Magnesium) .... Take 1 Capsule By Mouth Once A Day 2)  Topamax 100 Mg Tabs (Topiramate) .... Take 1 Tablet By Mouth At Bedtime 3)  Provigil 200 Mg Tabs (Modafinil) .... Take Tablet By Mouth Twice A Day 4)  Bupropion Hcl 300 Mg Tb24 (Bupropion Hcl) .... Take 1 Tablet By Mouth Once A Day 5)  Synthroid 175 Mcg  Tabs (Levothyroxine Sodium) .... One Tab By Mouth Once Daily On Empty Stomach 6)  Alprazolam 1 Mg Tabs (Alprazolam) .... Take 1  Tablet By Mouth Every Six Hours 7)  Vitamin E 1000 Unit  Caps (Vitamin E) .... Take One By Mouth Daily 8)  Mens Multivitamin Plus   Tabs (Multiple Vitamins-Minerals) .... Take One By Mouth Daily 9)  Stool Softener 10)  Citalopram Hydrobromide 20 Mg  Tabs (Citalopram Hydrobromide) .Marland Kitchen.. 1 Daily 11)  Cytomel 25 Mcg  Tabs (Liothyronine Sodium) .Marland Kitchen.. 1 Daily 12)  Testosterone Cypionate 200 Mg/ml Oil (Testosterone Cypionate) .... 2 Shots Per Month 13)  Uroxatral 10 Mg Xr24h-Tab (Alfuzosin Hcl) .Marland Kitchen.. 1 Daily By Mouth 14)  Percocet 10-325 Mg Tabs (Oxycodone-Acetaminophen) .... Every 4 To 6 Hours As Needed Pain By Mouth 15)  Omega 3 1200 Mg Daily .Marland KitchenMarland Kitchen. 1 Daily By Mouth 16)  Vitamin D 500 Iu .Marland KitchenMarland Kitchen. 1 Daily By Mouth 17)  Red Yeast Rice 600 Mg Tabs (Red Yeast Rice Extract) .Marland Kitchen.. 1200 Mg Daily By Mouth 18)  Vitamin B-12 500 Mcg Tabs (Cyanocobalamin) .Marland Kitchen.. 1 Daily By Mouth  Allergies: 1)  ! Neurontin (Gabapentin) 2)  Amoxicillin (Amoxicillin) 3)  Penicillin G Potassium (Penicillin G Potassium)  Family History: Father dec 70  Massive MI MI x 5(first 39yoa) CABG x 2 (62yoa) DM Mother dec 67  Emphysema, RAD (Smoker) HTN Osteopor Sister alive 85 (3/06) Colon Ca (Stage IV)  Physical Exam  General:  Well-developed,well-nourished,in no acute  distress; alert,appropriate and cooperative throughout examination Head:  Normocephalic and atraumatic without obvious abnormalities. No apparent alopecia or balding. Eyes:  Conjunctiva clear bilaterally.  Ears:  External ear exam shows no significant lesions or deformities.  Otoscopic examination reveals clear canals, tympanic membranes are intact bilaterally without bulging, retraction, inflammation or discharge. Hearing is grossly normal bilaterally. Cerumen in right ear occluding. Nose:  External nasal examination shows no deformity or inflammation. Nasal mucosa are pink and moist without lesions or exudates. Mouth:  Oral mucosa and oropharynx without lesions or  exudates.  Teeth in good repair. Neck:  No deformities, masses, or tenderness noted. Chest Wall:  No deformities, masses, tenderness or gynecomastia noted. Lungs:  Normal respiratory effort, chest expands symmetrically. Lungs are clear to auscultation, no crackles or wheezes. Heart:  Normal rate and regular rhythm. S1 and S2 normal without gallop, murmur, click, rub or other extra sounds.   Impression & Recommendations:  Problem # 1:  ESSENTIAL HYPERTENSION (ICD-401.9) Assessment New  Will start on Beta Blocker for CAD prophylaxis with FH of Father signif as well as Aunts and Uncles. His updated medication list for this problem includes:    Metoprolol Succinate 25 Mg Xr24h-tab (Metoprolol succinate) ..... One tab by mouth  BP today: 160/94 Prior BP: 140/80 (10/16/2008)  Labs Reviewed: K+: 4.0 (10/31/2006) Creat: : 1.1 (10/31/2006)   Chol: 138 (03/04/2006)   HDL: 42.8 (03/04/2006)   LDL: 80 (03/04/2006)   TG: 75 (03/04/2006)  Problem # 2:  HYPOTHYROIDISM (ICD-244.9) Assessment: Unchanged  Stable. Will refill script and schedule PE when able with labs done then in followup.  His updated medication list for this problem includes:    Synthroid 175 Mcg Tabs (Levothyroxine sodium) ..... One tab by mouth once daily on empty stomach    Cytomel 25 Mcg Tabs (Liothyronine sodium) .Marland Kitchen... 1 tab by mouth once daily  Labs Reviewed: TSH: 5.58 (01/31/2007)    Chol: 138 (03/04/2006)   HDL: 42.8 (03/04/2006)   LDL: 80 (03/04/2006)   TG: 75 (03/04/2006)  Complete Medication List: 1)  Nexium 40 Mg Cpdr (Esomeprazole magnesium) .... Take 1 capsule by mouth once a day 2)  Topamax 100 Mg Tabs (Topiramate) .... Take 1 tablet by mouth at bedtime 3)  Provigil 200 Mg Tabs (Modafinil) .... Take tablet by mouth twice a day 4)  Bupropion Hcl 300 Mg Tb24 (Bupropion hcl) .... Take 1 tablet by mouth once a day 5)  Synthroid 175 Mcg Tabs (Levothyroxine sodium) .... One tab by mouth once daily on empty  stomach 6)  Alprazolam 1 Mg Tabs (Alprazolam) .... Take 1 tablet by mouth every six hours 7)  Vitamin E 1000 Unit Caps (Vitamin e) .... Take one by mouth daily 8)  Mens Multivitamin Plus Tabs (Multiple vitamins-minerals) .... Take one by mouth daily 9)  Stool Softener  10)  Citalopram Hydrobromide 20 Mg Tabs (Citalopram hydrobromide) .Marland Kitchen.. 1 daily 11)  Cytomel 25 Mcg Tabs (Liothyronine sodium) .Marland Kitchen.. 1 tab by mouth once daily 12)  Testosterone Cypionate 200 Mg/ml Oil (Testosterone cypionate) .... 2 shots per month 13)  Uroxatral 10 Mg Xr24h-tab (Alfuzosin hcl) .Marland Kitchen.. 1 daily by mouth 14)  Percocet 10-325 Mg Tabs (Oxycodone-acetaminophen) .... Every 4 to 6 hours as needed pain by mouth 15)  Omega 3 1200 Mg Daily  .Marland Kitchen.. 1 daily by mouth 16)  Vitamin D 500 Iu  .Marland Kitchen.. 1 daily by mouth 17)  Red Yeast Rice 600 Mg Tabs (Red yeast rice extract) .Marland Kitchen.. 1200 mg daily by mouth 18)  Vitamin B-12 500 Mcg Tabs (Cyanocobalamin) .Marland Kitchen.. 1 daily by mouth 19)  Metoprolol Succinate 25 Mg Xr24h-tab (Metoprolol succinate) .... One tab by mouth  Patient Instructions: 1)  RTC 6 weeks for BP check, Comp Exam if poss. If not sched RTC appt 6 weeks and then next Avail Comp Exam with labs prior to Comp Exam, whenever sceduled. Prescriptions: CYTOMEL 25 MCG  TABS (LIOTHYRONINE SODIUM) 1 tab by mouth once daily Brand medically necessary #30 x 12   Entered and Authorized by:   Shaune Leeks MD   Signed by:   Shaune Leeks MD on 02/25/2009   Method used:   Electronically to        [facility]. [address] [phone]* (retail)       [address] [place], [place]  [id number]       Ph: [phone]       Fax: [phone]   RxID:   [id number] METOPROLOL SUCCINATE 25 MG XR24H-TAB (METOPROLOL SUCCINATE) one tab by mouth  #30 x 12   Entered and Authorized by:   Shaune Leeks MD   Signed by:   Shaune Leeks MD on 02/25/2009   Method used:   Electronically to        [facility]. [address] [phone]*  (retail)       [address] [place], [place]  [address]       Ph: [phone]       Fax: [phone]   RxID:   [id number]   Current Allergies (reviewed today): ! NEURONTIN (GABAPENTIN) AMOXICILLIN (AMOXICILLIN) PENICILLIN G POTASSIUM (PENICILLIN G POTASSIUM)

## 2010-03-26 NOTE — Progress Notes (Signed)
Summary: ? scabies  Phone Note Call from Patient Call back at [phone]   Caller: Patient Call For: Dr. Hetty Ely Summary of Call: Had a recent sexual encounter about 2 weeks ago  and his partner just phoned him last night saying that she has been diagnosed with scabies. Patient states he is itching some but doesn't know if this is maybe the "power of suggestion".    No rash right now but says through looking on the internet, it said that it can take up to 6 weeks for it to present itself.  He wants to know if there is an kind of preventative treatment tha the could use. He is around his nieces and nephews a lot and does not want to infect them.  [facility] Initial call taken by: Delilah Shan,  August 18, 2007 12:53 PM  Follow-up for Phone Call        I believe there is now OTC Kwell the pt can use if he so desires. Follow-up by: Shaune Leeks MD,  August 18, 2007 1:46 PM  Additional Follow-up for Phone Call Additional follow up Details #1::        Patient Advised.  Additional Follow-up by: Delilah Shan,  August 18, 2007 3:23 PM

## 2010-03-26 NOTE — Progress Notes (Signed)
Summary: Update on patient's condition  Phone Note Call from Patient Call back at [phone] ext [phone]   Caller: Patient Call For: Dr. Dayton Martes Summary of Call: Patient called to let Dr. Dayton Martes know how he is doing, saw her on Friday for possible shingles.  He states he is doing ok, still having some pain and itching but pain is not as bad as it was on Friday.  No headaches, no back pain.  He is doing fine.   Initial call taken by: Linde Gillis CMA Duncan Dull),  March 24, 2009 1:59 PM  Follow-up for Phone Call        Oh good.  Thank you. Follow-up by: Ruthe Mannan MD,  March 24, 2009 2:01 PM

## 2010-03-26 NOTE — Consult Note (Signed)
Summary: [facility]/Dr. [facility]/Dr. Patrecia Pace   Imported By: Eleonore Chiquito 03/15/2007 16:15:23  _____________________________________________________________________  External Attachment:    Type:   Image     Comment:   External Document

## 2010-03-26 NOTE — Consult Note (Signed)
Summary: [facility]/Consultation Report/Dr. [facility]/Consultation Report/Dr. Welton Flakes   Imported By: Mickle Asper 12/07/2007 11:47:01  _____________________________________________________________________  External Attachment:    Type:   Image     Comment:   External Document

## 2010-03-26 NOTE — Letter (Signed)
Summary: Dr.Brian Cope,[facility],Note  Dr.Brian Cope,[facility],Note   Imported By: Beau Fanny 05/22/2009 13:15:40  _____________________________________________________________________  External Attachment:    Type:   Image     Comment:   External Document

## 2010-03-26 NOTE — Progress Notes (Signed)
Summary: refill budeprion  Phone Note Refill Request Message from:  [employer] on March 28, 2008 4:37 PM  Refills Requested: Medication #1:  BUPROPION HCL 300 MG TB24 Take 1 tablet by mouth once a day   Last Refilled: 03/05/2008 [facility] [phone]  Initial call taken by: Providence Crosby,  March 28, 2008 4:37 PM      Prescriptions: BUPROPION HCL 300 MG TB24 (BUPROPION HCL) Take 1 tablet by mouth once a day  #30 x 3   Entered and Authorized by:   Shaune Leeks MD   Signed by:   Shaune Leeks MD on 03/28/2008   Method used:   Electronically to        [facility]. [address] [phone]* (retail)       [address] [place], [place]  [address]       Ph: [phone]       Fax: [phone]   RxID:   [id number]  Shaune Leeks MD  March 28, 2008 5:27 PM

## 2010-03-26 NOTE — Progress Notes (Signed)
Summary: refill budeprion xl 300mg    Phone Note Refill Request Message from:  [employer] on September 09, 2008 9:10 AM  Refills Requested: Medication #1:  BUPROPION HCL 300 MG TB24 Take 1 tablet by mouth once a day   Last Ref[facility]: [facility] [phone]  Initial call taken by: Providence Crosby LPN,  September 09, 2008 9:11 AM  Follow-up for Phone Call        Pt was asked lasty month to make appt to see me. Please insure he knows he'll need appt to see me before any further refills are done. Follow-up by: Shaune Leeks MD,  September 09, 2008 9:30 AM    Prescriptions: BUPROPION HCL 300 MG TB24 (BUPROPION HCL) Take 1 tablet by mouth once a day  #30 Tablet x 0   Entered and Authorized by:   Shaune Leeks MD   Signed by:   Shaune Leeks MD on 09/09/2008   Method used:   Electronically to        [facility]. [address] [phone]* (retail)       [address] [place], [place]  [address]       Ph: [phone]       Fax: [phone]   RxID:   [id number]

## 2010-03-26 NOTE — Consult Note (Signed)
Summary: [facility]/Consultation Report/Dr. [facility]/Consultation Report/Dr. Welton Flakes   Imported By: Mickle Asper 06/30/2007 15:26:00  _____________________________________________________________________  External Attachment:    Type:   Image     Comment:   External Document

## 2010-03-26 NOTE — Consult Note (Signed)
Summary: EVALUATION OF ACUTE COUGH / DR. SAADAT KHAN  EVALUATION OF ACUTE COUGH / DR. SAADAT KHAN   Imported By: Carin Primrose 08/02/2008 15:29:03  _____________________________________________________________________  External Attachment:    Type:   Image     Comment:   External Document

## 2010-03-26 NOTE — Assessment & Plan Note (Signed)
Summary: DISCUSS THYROID TEST/[facility]  Medications Added * SYNTHROID TABS (LEVOTHYROXINE SODIUM) Take one by mouth daily * TESTIM !% 1 TUBE DAILY        Vital Signs:  Patient Profile:   50 Years Old Male Weight:      204 pounds Temp:     99 degrees F oral Pulse rate:   80 / minute Pulse rhythm:   regular BP sitting:   120 / 60  (left arm) Cuff size:   regular  Vitals Entered ByProvidence Crosby (January 31, 2007 9:03 AM)                 Chief Complaint:  f/u labs .  History of Present Illness: Still c/o fatigue, skin dryness, weight gain and depression but has recently lost another family member/parent. Feels he hasn't really changed food intake or activity level, yet fatigue and weight gain continue.  Current Allergies (reviewed today): ! NEURONTIN (GABAPENTIN) AMOXICILLIN (AMOXICILLIN) PENICILLIN G POTASSIUM (PENICILLIN G POTASSIUM)      Physical Exam  General:     Well-developed,well-nourished,in no acute distress; alert,appropriate and cooperative throughout examination Head:     Normocephalic and atraumatic without obvious abnormalities. No apparent alopecia or balding. Eyes:     Conjunctiva clear bilaterally.  Ears:     External ear exam shows no significant lesions or deformities.  Otoscopic examination reveals clear canals, tympanic membranes are intact bilaterally without bulging, retraction, inflammation or discharge. Hearing is grossly normal bilaterally. Nose:     External nasal examination shows no deformity or inflammation. Nasal mucosa are pink and moist without lesions or exudates. Mouth:     Oral mucosa and oropharynx without lesions or exudates.  Teeth in good repair. Neck:     No deformities, masses, or tenderness noted. Thyroid feels nml w/o obvious nodularity or enlargement. Chest Wall:     No deformities, masses, tenderness or gynecomastia noted. Lungs:     Normal respiratory effort, chest expands symmetrically. Lungs are clear to  auscultation, no crackles or wheezes. Heart:     Normal rate and regular rhythm. S1 and S2 normal without gallop, murmur, click, rub or other extra sounds. Abdomen:     Bowel sounds positive,abdomen soft and non-tender without masses, organomegaly or hernias noted.    Impression & Recommendations:  Problem # 1:  FATIGUE (ICD-780.79) Labs at Dr Santo Held office show thyroid to be underproducing, labs here in Sep showed function normal so will repeat to break the tie. If TSH elevated will increase Synthroid and recheck in 3 mos.  If TSH nml, will refer to Dr Kerrie Pleasure for further investigation. Orders: Venipuncture (04540) TLB-TSH (Thyroid Stimulating Hormone) (84443-TSH)   Problem # 2:  DEPRESSION (ICD-311) Assessment: Unchanged Stable. His updated medication list for this problem includes:    Bupropion Hcl 300 Mg Tb24 (Bupropion hcl) .Marland Kitchen... Take 1 tablet by mouth once a day    Alprazolam 1 Mg Tabs (Alprazolam) .Marland Kitchen... Take 1 tablet by mouth every six hours   Complete Medication List: 1)  Nexium 40 Mg Cpdr (Esomeprazole magnesium) .... Take 1 capsule by mouth once a day 2)  Topamax 100 Mg Tabs (Topiramate) .... Take 1 tablet by mouth at bedtime 3)  Provigil 200 Mg Tabs (Modafinil) .... Take tablet by mouth twice a day 4)  Avinza 90 Mg Cp24 (Morphine sulfate beads) .... Take 1 capsule by mouth once a day 5)  Bupropion Hcl 300 Mg Tb24 (Bupropion hcl) .... Take 1 tablet by mouth once a  day 6)  Synthroid Tabs (levothyroxine Sodium)  .... Take one by mouth daily 7)  Alprazolam 1 Mg Tabs (Alprazolam) .... Take 1 tablet by mouth every six hours 8)  Vitamin E 1000 Unit Caps (Vitamin e) .... Take one by mouth daily 9)  Mens Multivitamin Plus Tabs (Multiple vitamins-minerals) .... Take one by mouth daily 10)  Stool Softener  11)  Testim !%  .Marland Kitchen.. 1 tube daily 12)  Oxygen 2 Liters At [facility]    Patient Instructions: 1)  RTC as needed or based on lab results.    ]

## 2010-03-26 NOTE — Letter (Signed)
Summary: Randy Mclean letter  [facility] at [facility]  [address] [place], [place] [address]   Phone: [phone]  Fax: [phone]       09/30/2009 MRN: [id number]  [facility] [address] [place], [place]  [address]  Dear Mr. [facility] [place] and [place] announce the retirement of Arta Silence, M.D., from full-time practice at the [facility] office effective August 21, 2009 and his plans of returning part-time.  It is important to Dr. Hetty Ely and to our practice that you understand that [facility] has seven physicians in our office for your health care needs.  We will continue to offer the same exceptional care that you have today.    Dr. Hetty Ely has spoken to many of you about his plans for retirement and returning part-time in the fall.   We will continue to work with you through the transition to schedule appointments for you in the office and meet the high standards that [place] is committed to.   Again, it is with great pleasure that we share the news that Dr. Hetty Ely will return to [facility] in October of 2011 with a reduced schedule.    If you have any questions, or would like to request an appointment with one of our physicians, please call us at [phone] and press the option for Scheduling an appointment.  We take pleasure in providing you with excellent patient care and look forward to seeing you at your next office visit.  Our [facility] Physicians are:  Tillman Abide, M.D. Laurita Quint, M.D. Roxy Manns, M.D. Kerby Nora, M.D. Hannah Beat, M.D. Ruthe Mannan, M.D. We proudly welcomed Raechel Ache, M.D. and Eustaquio Boyden, M.D. to the practice in July/August 2011.  Sincerely,  [place] [facility]

## 2010-03-27 ENCOUNTER — Ambulatory Visit: Payer: Self-pay

## 2010-05-25 ENCOUNTER — Other Ambulatory Visit: Payer: Self-pay | Admitting: Family Medicine

## 2010-07-10 NOTE — Assessment & Plan Note (Signed)
[facility]                               PULMONARY OFFICE NOTE   DAREK, EIFLER                       MRN:          [id number]  DATE:11/18/2005                            DOB:          26-Jul-1960    This is a pulmonary/extended follow up office visit.   HISTORY:  A 50 year old white male who quit smoking effective August 2 with  a history of possible interstitial lung disease on a background of active  smoking plus polysubstance use for chronic pain. He was seen on September 11  on a complex medical regimen and had not responded to any of his inhalers  which I asked him to stop at that point and maximize treatment for reflux by  taking Nexium and Reglan, which he says he has been not doing. He tapered  off prednisone and comes back today stating he is no better, no worse on or  off prednisone. He returns for chest x-ray as requested, coming of dizziness  when he walks too far as well as dyspnea. However, he also complains of  shortness of breath at rest and also when he lies down at night. He denies  any significant cough, pleuritic pain, fevers, chills, sweats, orthopnea, or  leg swelling.   PHYSICAL EXAMINATION:  GENERAL:  On physical examination, he is an anxious  white male in no acute distress. Stable vital signs.  HEENT:  Unremarkable. Pharynx is clear.  LUNGS:  Lung fields ____________ auscultation and percussion with no  crackles on inspiration or wheezes on expiration.  CARDIAC:  Regular rhythm without murmur, gallop or rub.  ABDOMEN:  Soft, benign.  EXTREMITIES:  Warm without calf tenderness, cyanosis or clubbing.   Chest x-ray today is normal. Hemoglobin saturation, however, drops to 90%  after 1 lap around the office and caused him to be dizzy. Associated with  dizziness more than dyspnea.   IMPRESSION:  This is a really unusual pattern of dyspnea associated with  dizziness and hypoxemia that occurs reproducibly with exercise  but also  apparently occurs at rest and nocturnally. This does not fit the typical  pattern of interstitial lung disease, and I continue to be concerned about  this patient's polysubstance use and complex medical regimen which places  him at risk for both adverse drug affects and also aspiration syndrome.   RECOMMENDATIONS:  1. Try to simply his regimen further by eliminating Singulair from it,      also Flexeril (which he thinks may be making him dizzy).  2. Repeat lab profile today including a TSH, sed rate, CBC, and BMP.  3. Follow up in one week for full medication reconciliation.  4. Follow up with PFTs in 4 weeks.  5. Repeat CT scan of the head and chest, if he is not improved on his next      visit in a week.            ______________________________  Charlaine Dalton. Sherene Sires, MD, [facility]      MBW/MedQ  DD:  11/18/2005  DT:  11/20/2005  Job #:  [id number]  cc:   [place] Charon, MD

## 2010-07-10 NOTE — Discharge Summary (Signed)
Randy Mclean, Randy Mclean              ACCOUNT NO.:  [id number]   MEDICAL RECORD NO.:  [id number]          PATIENT TYPE:  INP   LOCATION:  4729                         FACILITY:  [facility]   PHYSICIAN:  Valerie A. Felicity Coyer, MDDATE OF BIRTH:  [birth year]   DATE OF ADMISSION:  11/24/2005  DATE OF DISCHARGE:  11/26/2005                                 DISCHARGE SUMMARY   DISCHARGE DIAGNOSES:  1. Atypical chest pain, non cardiogenic, non pulmonary source.  2. Depression/anxiety.  3. Chronic pain syndrome.  4. Gastroesophageal reflux disease/history of Barrett esophagus.  5. Rule out thyroid nodule.   HISTORY OF PRESENT ILLNESS:  [name] is a 50 year old male admitted on  November 24, 2005, with chief complaint of left-sided chest pain worse with  inspiration and swelling of his lower extremities.  The patient had gone to  urgent care on November 21, 2005, secondary to lower extremity edema.  He  was placed on Lasix and K-Dur.  He also notes poor concentration on  admission.  He had been hospitalized at [facility] for pneumonia,  August of 2007, and subsequently followed up with [facility] for  further evaluation.  He was admitted for further therapy and evaluation.   PAST MEDICAL HISTORY:  1. GERD/Barrett's esophagitis.  2. Pneumonia.  3. Depression/anxiety.  4. Hypothyroidism.  5. Dyslipidemia.  6. Chronic low back pain.   COURSE OF HOSPITALIZATION:  Problem #1. Atypical chest pain:  The patient  was admitted and was evaluated by Dr. Charlton Haws of [facility].  It was recommended that the patient undergo a 2D echo which was performed on  November 25, 2005, which showed a normal left ventricular ejection fraction  55% and no pericardial effusion noted.  In addition, a CT angio of the chest  was performed which showed no evidence for PE.  It did note tiny mediastinal  hilar nodes and minimal alveolar filling especially involving the upper  lobes.  A fatty liver was  also noted.  He was maintained on telemetry and is  felt by [facility] that there were no active cardiac issues at this  time.   The patient was also seen by Dr. Sherene Sires during this admission and it was felt  his chest pain was non pulmonary.  He questioned the possibility of  fibromyalgia versus related psychiatric issues.  The patient's partner  raised the question during this admission about possible underlying sleep  apneas.  He stated that he noted that the patient's O2 sat dropped on the  monitor while he was sleeping.  Will defer further outpatient workup to  patient's primary care Randy Mclean.   1. Rule out thyroid nodule:  The patient was noted to have a small, pearl-      sized, mobile nodule on exam which was slightly left of midline on the      anterior neck.  A TSH was performed during this hospitalization which      was within normal limits.  Patient may need an outpatient thyroid      ultrasound performed.  Will defer to his primary care for further  followup.   MEDICATIONS AT DISCHARGE:  1. K-Dur 20 mEq p.o. daily.  2. Topamax 100 mg p.o. daily.  3. Lipitor 10 mg p.o. daily.  4. Colace 2 tablets p.o. daily.  5. Flomax 0.4 mg p.o. daily.  6. Lasix 20 mg p.o. daily.  7. Nexium 4 mg p.o. b.i.d.  8. Avinza 120 mg p.o. daily.  9. Morphine sulfate 15 mg p.o. q.8h. as needed.  10.Wellbutrin 300 mg p.o. daily.  11.Reglan 10 mg before each meal and at bedtime.  12.Flexeril 5 mg every 6 hours as needed.   PERTINENT LABORATORIES AT DISCHARGE:  TSH 1.568.  Cardiac enzymes negative  x2.  BNP less than 30.  ESR 17, BUN 7, creatinine 1.1, hemoglobin 12.5,  hematocrit 37.8.   DISPOSITION AND PLAN:  Transfer patient to home.   FOLLOWUP:  The patient is instructed to follow up with Dr. Hetty Ely in 1-2  weeks and call for an appointment.  He is instructed to call Dr. Hetty Ely  should he develop worsening chest discomfort or shortness of breath, or go  directly to the  emergency department.     ______________________________  Sandford Craze, NP      Raenette Rover. Felicity Coyer, MD  Electronically Signed    MO/MEDQ  D:  11/26/2005  T:  11/27/2005  Job:  [id number]   cc:   Dr. Hetty Ely at [place], [facility].
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
## 2010-07-10 NOTE — Op Note (Signed)
NAMEJAMARR, Randy Mclean                          ACCOUNT NO.:  [id number]   MEDICAL RECORD NO.:  [id number]                   PATIENT TYPE:  INP   LOCATION:  2899                                 FACILITY:  [facility]   PHYSICIAN:  Reinaldo Meeker, M.D.              DATE OF BIRTH:  [birth year]   DATE OF PROCEDURE:  06/04/2002  DATE OF DISCHARGE:                                 OPERATIVE REPORT   PREOPERATIVE DIAGNOSES:  False joint and possible pars defects, L5 with L5  nerve root compression on the left.   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   PROCEDURE:  Bilateral L5-S1 decompressive laminectomies with decompression  of left L5 nerve root from the false joint bony overgrowth.  Microdissection  L5-S1 nerve roots on the left and S1 nerve root on the right.   SURGEON:  Reinaldo Meeker, M.D.   ASSISTANT:  Kathaleen Maser. Pool, M.D.   DESCRIPTION OF PROCEDURE:  After being placed in the prone position, the  patient's back was prepped and draped in the usual sterile fashion.  Localized x-rays taken prior to incision to identify the appropriate level.  Midline incision was made above the spinous processes of both L5 and S1 in  anticipation of fusion, with instrumentation.  Using Bovie cutting current,  the incision ws carried down to the spinous processes.  Subperiosteal  dissection was then carried out bilaterally on the spinous processes, lamina  and facet joint. Self-retaining retractor was placed for exposure.  X-rays  showed approach to the appropriate levels.  Using Stille rongeur, spinous  processes of the interspinal ligaments of L5-S1 were removed.  Bilateral  laminectomies were performed by removing the near entirety of the L5 lamina,  meeting at the facet joint.   At this time, pars defects were not evident. It was felt that we could be  dealing with simply a false joint.  Therefore, a place for fusion was  changed and the microscope was brought  into the field for decompression of  the L5 nerve root on the root. Using the microdissection technique and a  variety of Kerrison punches, the bony overgrowth making up the false joint  was removed.  The underlying L5 nerve root was easily identified and tracked  completely out, with a total removal of any bony overgrowth until it was  completely decompressed.  At this point, inspection was carried out once  more and there was no evidence of instability.  It was elected not to  proceed with fusion at this time. Disk was identified and found not to be  pathologic.  The S1 and L5 nerve roots were both free on the left; the S1  nerve root was well decompressed on the right.   At this point, copious amounts of irrigation were carried out and any  bleeding was controlled with bipolar coagulation and Gelfoam.  The wound was  closed using interrupted Vicryl in the muscle, fascia and subcutaneous and  subcuticular tissues, and staples on the skin. Sterile dressing was then  applied.  The patient was extubated, taken to the recovery room in stable  condition.                                                  Reinaldo Meeker, M.D.    ROK/MEDQ  D:  06/04/2002  T:  06/04/2002  Job:  [id number]

## 2010-07-10 NOTE — Consult Note (Signed)
NAMESHRIHAN, PUTT              ACCOUNT NO.:  [id number]   MEDICAL RECORD NO.:  [id number]          PATIENT TYPE:  INP   LOCATION:  4729                         FACILITY:  [facility]   PHYSICIAN:  Casimiro Needle B. Sherene Sires, MD, FCCPDATE OF BIRTH:  [birth year]   DATE OF CONSULTATION:  11/25/2005  DATE OF DISCHARGE:  11/26/2005                                   CONSULTATION   CHIEF COMPLAINT:  Chest pain.   HISTORY:  This is a 50 year old white male status post successful smoke  cessation in August of 2001 whom I have been evaluating as an outpatient for  unexplained dyspnea with wispy, minimal infiltrates on CT scan dated April  2007 and normal sed rate.  He had not improved on or off cigarettes or even  with prednisone or various inhalers in terms of dyspnea and was supposed to  return to the office for a set of PFTs but now comes in with left-sided  anterior chest discomfort that was abrupt in onset on November 22, 2005,  somewhat pleuritic and positional in nature and unrelieved with narcotic  medications.  He says it has been the same ever since it started.  This is  the exact same as his complaint of dyspnea which has been exactly the same  ever since it started.  He had been treated for GERD as an outpatient (he  does have documented Barrett's) and also is being treated already for  chronic pain syndrome with a high-dose morphine at the onset of his pain.  So far he has had a negative CT scan of the chest and returns now PFTs that  are also normal.   He denies any change in dyspnea since the onset of his pain.  Today states  the pain is sharp in nature and is equal anterior as well as posterior with  no radiation on movement of his arm or neck and no exertional component.  He  denies any orthopnea or PND.  He has noticed also mild leg swelling.   PAST HISTORY:  Significant for chronic pain syndrome and GERD complicated by  esophagitis.  He has also had remote sinus surgery, history of  hyperlipidemia and hypertension.   ALLERGIES:  PENICILLIN causes a rash.   MEDICATIONS:  1. Presently he is maintained on Flomax 0.4 mg daily.  2. K-Dur 20 mEq daily.  3. Lasix 20 mg b.i.d.  4. MS Contin 60 mg b.i.d.  5. Protonix 40 b.i.d.  6. Reglan 10 mg before meals and at bedtime.  7. Topamax 100 mg daily.  8. Synthroid 137 mcg daily.  9. Wellbutrin 300 mg daily.  10.Zocor 20 mg q.h.s.   SOCIAL HISTORY:  He quit smoking August 2001.  He has worked as an [employer] fast streamer.   FAMILY HISTORY:  Significant for his mother and father both have respiratory  disease and were smokers.   REVIEW OF SYSTEMS:  Taken in detail and essentially negative except as  outlined above.   PHYSICAL EXAMINATION:  This is a chronically-ill, depressed-appearing white  male in no acute distress.  He is afebrile on vital signs.  HEENT is  unremarkable.  Oropharynx clear.  Lung fields perfectly clear bilaterally to  auscultation and percussion.  There was no rash present and no tenderness  over the chest wall.  Abdomen is soft, benign with no palpable organomegaly,  masses or tenderness.  Extremities are warm without calf tenderness,  clubbing, edema.  Neurologic with no focal deficits or pathologic reflexes.  Skin exam was warm and dry.   Lab data included a normal sed rate, normal set of PFTs.  CT scan is  essentially normal.  ABGs on room air pH 7.40, pCO2 of 41, pO2 67.   IMPRESSION:  This patient has unexplained dyspnea chronically and now  unexplained chest pain with vaguely pleuritic features.  This has happened  in the setting of chronic narcotic dependence for chronic pain syndrome  status post successful smoking cessation in August of 2007.  I have no  explanation for any of his symptoms based on any pathophysiologic problem  that I can identify other than the fact that he does have Barrett's.   The only thing I would add to his care was that we should document his  saturation  walking.  If he does desaturate, perhaps it would be best to  refer him to a tertiary medical center for further evaluation.  I suspect  much of his problem is a functional/self-induced related to previous smoking  exposure as well as concern about surreptitious drug use and would strongly  encourage a urine drug screen on his next admission if recurrent admissions  occur.   I reassured the patient of what he does not have.  He does not have any  evidence of cancer, pulmonary embolism, underlying rheumatologic disease or  significant lung disease interstitial obstructive or otherwise.  I do not  believe any regular pulmonary follow-up in the [facility] will be  necessary.           ______________________________  Charlaine Dalton Sherene Sires, MD, [facility]     MBW/MEDQ  D:  11/25/2005  T:  11/27/2005  Job:  [id number]   cc:   Arta Silence, MD

## 2010-07-10 NOTE — Assessment & Plan Note (Signed)
[facility]                               PULMONARY OFFICE NOTE   NAME:Randy Mclean, Randy Mclean                       MRN:          [id number]  DATE:11/02/2005                            DOB:          [birth year]    CHIEF COMPLAINT:  Dyspnea.   HISTORY:  [age]-year-old white male with abrupt onset of dyspnea in  April 2007 that has been the same ever since with no apparent improvement  on or off cigarettes or with prednisone.  He associates this hoarseness and  a dry cough, but has no fever, chills, sweats, orthopnea, PND, leg swelling  or significant sputum production.  He says that nothing that ever has been  done helps him in terms of medication.  He says he is much worse when he  goes for a long walk or gets out in hot air.  He denies any associated  pleuritic pain, fevers, chills, sweats, orthopnea, PND or leg swelling.   PAST MEDICAL HISTORY:  Significant for chronic pain syndrome and GERD  complicated by Barrett's.  He has also had remote sinus surgery and has a  history of hyperlipidemia and hypotension.   ALLERGIES:  PENICILLIN causes a rash.   MEDICATIONS:  Reviewed in detail on the work sheet dated November 02, 2005,  noting that he is on Advair, which he says doesn't help and also on  prednisone now at 25 mg daily.   SOCIAL HISTORY:  He quit smoking on September 22, 2005.  He has worked as an  Marine scientist, but cannot work presently and has not been able to  work since September 22, 2005 because he cannot use his voice.   FAMILY HISTORY:  Significant for the fact that his mother and father both  have respiratory disease and have been previous smokers.   REVIEW OF SYSTEMS:  Taken in detail on the worksheet, significant for weight  gain from prednisone and also dysphagia and hoarseness that have been  present since about the time he started Advair.   PHYSICAL EXAMINATION:  GENERAL:  This is a very hoarse, chronically ill-  appearing  white male who appears about 10 years older than stated age with  classic prominent pseudowheeze.  VITAL SIGNS:  He is afebrile with stable vital signs.  HEENT:  Unremarkable.  Oropharynx is clear.  There is no evidence of thrush,  postnasal drainage or cobblestoning.  NECK:  Supple without cervical adenopathy or tenderness.  Trachea is midline  with no thyromegaly.  LUNGS:  Prominent pseudowheeze is present with no true wheezing.  CARDIAC:  There is a regular rhythm without murmur, gallop or rub.  ABDOMEN:  Soft and benign.  EXTREMITIES:  Warm without calf tenderness, cyanosis, clubbing or edema.   Chest x-rays are reviewed all the back to April of 2007 and do not indicate  any definite evidence of interstitial lung disease.   Extensive notes on this patient from [facility] were reviewed from  his hospitalization in August, when he was felt to have possible  interstitial lung disease by Dr. Mayo Ao, a pulmonologist at [facility], but  was never biopsied.   IMPRESSION:  1. No convincing evidence of interstitial lung disease at this point.  His      main symptoms are cough and dyspnea, which may be entirely upper airway      in nature, based on his exam today.  I recommended that he eliminate      Advair from his regimen and rapidly taper prednisone off over 9 days      before returning here for followup chest x-ray.  At that point, we will      do a sedimentation rate and BNP, if he is still symptomatic.  2. He clearly has reflux as evidenced by the fact that he has a history of      Barrett's.  I therefore recommended maximum treatment directed at      reflux with Nexium 40 mg b.i.d. before meals and metoclopramide 10 mg      before meals and at bedtime.  In fact, reflux may be a unifying      diagnosis for all of his pulmonary symptoms and may even explain subtle      interstitial changes seen on chest x-ray (nocturnal reflux can      certainly cause wispy interstitial  changes).  The differential      diagnosis also includes eosinophilic granulomatosis from smoking as      well as desquamative interstitial pneumonitis, but either one of these      would have improved with elimination of cigarettes, which he has      already done, and note that he does not symptomatically feel any better      at this point.   I spent extra time with this patient, going over line by line the medication  recommendations I made in writing, keeping a copy for the front of the  chart, and we will see him back here in 2 weeks to see to what extent these  changes improve his symptoms.                                   Charlaine Dalton. Sherene Sires, MD, [facility]   MBW/MedQ  DD:  11/02/2005  DT:  11/03/2005  Job #:  [id number]   cc:   Arta Silence, MD

## 2010-10-18 ENCOUNTER — Other Ambulatory Visit: Payer: Self-pay | Admitting: Family Medicine

## 2010-10-19 ENCOUNTER — Other Ambulatory Visit: Payer: Self-pay | Admitting: *Deleted

## 2010-11-13 ENCOUNTER — Ambulatory Visit: Payer: Self-pay

## 2010-11-14 ENCOUNTER — Other Ambulatory Visit: Payer: Self-pay | Admitting: Family Medicine

## 2010-11-16 NOTE — Telephone Encounter (Signed)
Left message on machine for patient to call back.

## 2010-11-18 NOTE — Telephone Encounter (Signed)
Left message on machine for patient to call back.

## 2010-11-19 NOTE — Telephone Encounter (Signed)
Pharmacist notified as instructed by telephone. Pharmacist Lurena Joiner) will let patient know that he needs to call the office for an appointment to get established with another doctor since Dr. Hetty Ely is retiring.

## 2010-11-19 NOTE — Telephone Encounter (Signed)
Have left several messages for patient to call back. Patient has not been seen in over a year. What should be done regarding the refill?

## 2010-12-30 ENCOUNTER — Ambulatory Visit: Payer: Self-pay

## 2011-03-11 ENCOUNTER — Ambulatory Visit: Payer: Self-pay | Admitting: Orthopedic Surgery

## 2011-06-19 ENCOUNTER — Other Ambulatory Visit: Payer: Self-pay | Admitting: Family Medicine

## 2011-06-21 NOTE — Telephone Encounter (Signed)
Ok to refill? Hasn't been seen since 6/11. You have never seen him.

## 2011-06-21 NOTE — Telephone Encounter (Signed)
Will provide with 1-2 mo supply while gets in to see [place].

## 2011-08-22 ENCOUNTER — Other Ambulatory Visit: Payer: Self-pay | Admitting: Family Medicine

## 2011-08-31 DIAGNOSIS — E063 Autoimmune thyroiditis: Secondary | ICD-10-CM | POA: Insufficient documentation

## 2011-09-07 DIAGNOSIS — R0600 Dyspnea, unspecified: Secondary | ICD-10-CM | POA: Insufficient documentation

## 2011-09-07 DIAGNOSIS — J449 Chronic obstructive pulmonary disease, unspecified: Secondary | ICD-10-CM | POA: Insufficient documentation

## 2011-09-08 DIAGNOSIS — M545 Low back pain, unspecified: Secondary | ICD-10-CM | POA: Insufficient documentation

## 2011-12-19 ENCOUNTER — Other Ambulatory Visit: Payer: Self-pay | Admitting: Family Medicine

## 2011-12-22 DIAGNOSIS — N486 Induration penis plastica: Secondary | ICD-10-CM | POA: Insufficient documentation

## 2011-12-22 DIAGNOSIS — R6882 Decreased libido: Secondary | ICD-10-CM | POA: Insufficient documentation

## 2011-12-22 DIAGNOSIS — E291 Testicular hypofunction: Secondary | ICD-10-CM | POA: Insufficient documentation

## 2012-01-28 DIAGNOSIS — R339 Retention of urine, unspecified: Secondary | ICD-10-CM | POA: Insufficient documentation

## 2012-01-28 DIAGNOSIS — R350 Frequency of micturition: Secondary | ICD-10-CM | POA: Insufficient documentation

## 2012-01-28 DIAGNOSIS — R3911 Hesitancy of micturition: Secondary | ICD-10-CM | POA: Insufficient documentation

## 2012-03-08 ENCOUNTER — Other Ambulatory Visit: Payer: Self-pay | Admitting: Family Medicine

## 2012-03-08 NOTE — Telephone Encounter (Signed)
Pt is due for office visit for physical or med refill - plz call and schedule.

## 2012-03-08 NOTE — Telephone Encounter (Signed)
Message left for patient to return my call.  

## 2012-04-24 ENCOUNTER — Encounter: Payer: Self-pay | Admitting: *Deleted

## 2012-05-01 ENCOUNTER — Ambulatory Visit (INDEPENDENT_AMBULATORY_CARE_PROVIDER_SITE_OTHER): Payer: BC Managed Care – PPO | Admitting: Cardiovascular Disease

## 2012-05-01 ENCOUNTER — Encounter: Payer: Self-pay | Admitting: Cardiovascular Disease

## 2012-05-01 VITALS — BP 124/74 | HR 68 | Ht 72.0 in | Wt 223.5 lb

## 2012-05-01 DIAGNOSIS — R079 Chest pain, unspecified: Secondary | ICD-10-CM | POA: Insufficient documentation

## 2012-05-01 DIAGNOSIS — I10 Essential (primary) hypertension: Secondary | ICD-10-CM

## 2012-05-01 DIAGNOSIS — R9431 Abnormal electrocardiogram [ECG] [EKG]: Secondary | ICD-10-CM

## 2012-05-01 NOTE — Patient Instructions (Addendum)
Your physician has requested that you have cardiac CT. Cardiac computed tomography (CT) is a painless test that uses an x-ray machine to take clear, detailed pictures of your heart. For further information please visit www.cardiosmart.org. Please follow instruction sheet as given.   

## 2012-05-01 NOTE — Assessment & Plan Note (Signed)
His blood pressure is well controlled on current medications. 

## 2012-05-01 NOTE — Assessment & Plan Note (Signed)
His chest pain is overall atypical but continues to be a recurrent problem. It is associated with significant exertional dyspnea which is more concerning. He has multiple risk factors for coronary artery disease. His treadmill stress test last year was borderline abnormal with chest pain in recovery associated with PVCs but no significant ST changes. Given that his symptoms persisted, further evaluation is warranted. I discussed with him different diagnostic options including proceeding with cardiac catheterization which he prefers to avoid, stress testing with imaging modality or CTA of the coronary arteries. I still think that the chance of obstructive coronary artery disease  is overall low. Thus, I think the best option might be CTA of the coronary arteries which should give [place] enough information about underlying coronary artery disease or plaque formation especially with his risk factors and family history. Thus, we will proceed with this.

## 2012-05-01 NOTE — Progress Notes (Signed)
HPI  This is a 52 year old Caucasian male who was referred by Dr. Welton Flakes for evaluation of atypical chest pain and abnormal stress test. The patient has no previous cardiac history. He has known history of hypertension, hyperlipidemia, previous tobacco use and family history of premature coronary artery disease. He also suffers from anxiety and depression. Last year in the summer time, he complained of brief left side of substernal sharp chest discomfort with increased exertional dyspnea. He underwent an echocardiogram which showed normal LV systolic function, mild left ventricular hypertrophy and no evidence of pulmonary hypertension or significant valvular disease. He underwent a treadmill stress test and was able to exercise for 12-1/2 minutes. He had chest pain in recovery with PVCs but no significant ST depression. He was referred to [facility] cardiology and was seen by Dr. Arrie Eastern. It was felt that his stress test was overall low risk and he was managed medically. The patient overall feels better. He continues to complain of intermittent left-sided sharp chest discomfort which happens at rest and does not worsen with physical activities. He does feel associated palpitations and increased exertional dyspnea.  Allergies  Allergen Reactions  . Amoxicillin     REACTION: unspecified  . Gabapentin     REACTION: anaphylaxis  . Penicillins     REACTION: unspecified     Current Outpatient Prescriptions on File Prior to Visit  Medication Sig Dispense Refill  . albuterol (PROVENTIL HFA;VENTOLIN HFA) 108 (90 BASE) MCG/ACT inhaler Inhale 2 puffs into the lungs every 6 (six) hours as needed for wheezing.      Marland Kitchen alfuzosin (UROXATRAL) 10 MG 24 hr tablet Take 10 mg by mouth daily.      Marland Kitchen ALPRAZolam (XANAX) 1 MG tablet Take 1 mg by mouth 3 (three) times daily as needed for sleep.      Marland Kitchen atorvastatin (LIPITOR) 10 MG tablet Take 10 mg by mouth daily.      Marland Kitchen azelastine (ASTELIN) 137 MCG/SPRAY nasal spray  Place 1 spray into the nose as needed. Use in each nostril as directed      . buPROPion (WELLBUTRIN XL) 300 MG 24 hr tablet take 1 tablet by mouth once daily  30 tablet  2  . cholecalciferol (VITAMIN D) 1000 UNITS tablet Take 1,000 Units by mouth daily.      . citalopram (CELEXA) 20 MG tablet Take 20 mg by mouth daily.      Marland Kitchen doxycycline (DORYX) 100 MG EC tablet Take 50 mg by mouth daily.      Marland Kitchen esomeprazole (NEXIUM) 40 MG packet Take 40 mg by mouth 2 (two) times daily.      Marland Kitchen l-methylfolate-B6-B12 (METANX) 3-35-2 MG TABS Take 1 tablet by mouth 2 (two) times daily.      Marland Kitchen levothyroxine (SYNTHROID, LEVOTHROID) 175 MCG tablet Take 175 mcg by mouth daily.      Marland Kitchen lidocaine-hydrocortisone (ANAMANTEL HC) 3-0.5 % CREA Place 1 Applicatorful rectally 2 (two) times daily as needed.      . metoprolol succinate (TOPROL-XL) 25 MG 24 hr tablet Take 25 mg by mouth daily.      . modafinil (PROVIGIL) 200 MG tablet Take 200 mg by mouth 2 (two) times daily.      . mometasone-formoterol (DULERA) 100-5 MCG/ACT AERO Inhale 2 puffs into the lungs every 12 (twelve) hours.      . Multiple Vitamin (MULTIVITAMIN) tablet Take 1 tablet by mouth daily.      . Omega 3-6-9 Fatty Acids (TRIPLE OMEGA-3-6-9 PO)  Take by mouth daily.      Marland Kitchen oxyCODONE-acetaminophen (PERCOCET) 7.5-325 MG per tablet Take 1 tablet by mouth every 4 (four) hours as needed for pain.      . pregabalin (LYRICA) 50 MG capsule Take 50 mg by mouth 3 (three) times daily.       . Tapentadol HCl (NUCYNTA) 100 MG TABS Take by mouth 2 (two) times daily.      Marland Kitchen topiramate (TOPAMAX) 200 MG tablet Take 200 mg by mouth 2 (two) times daily.       No current facility-administered medications on file prior to visit.     Past Medical History  Diagnosis Date  . Anemia   . Arthritis   . Asthma   . Hyperlipidemia   . Hypertension   . Pneumonia   . MI (myocardial infarction)   . Hypothyroid      Past Surgical History  Procedure Laterality Date  . Back surgery     . Elbow surgery    . Hemorrhoid surgery    . Lipoma excision Right     leg  . Spinal cord stimulator implant       Family History  Problem Relation Age of Onset  . Hypertension Mother   . Heart attack Father      History   Social History  . Marital Status: Single    Spouse Name: N/A    Number of Children: N/A  . Years of Education: N/A   Occupational History  . Not on file.   Social History Main Topics  . Smoking status: Former Smoker -- 1.00 packs/day for 30 years    Types: Cigarettes  . Smokeless tobacco: Not on file  . Alcohol Use: No  . Drug Use: Yes     Comment: past  . Sexually Active: Not on file   Other Topics Concern  . Not on file   Social History Narrative  . No narrative on file     ROS Constitutional: Negative for fever, chills, diaphoresis, activity change, appetite change and fatigue.  HENT: Negative for hearing loss, nosebleeds, congestion, sore throat, facial swelling, drooling, trouble swallowing, neck pain, voice change, sinus pressure and tinnitus.  Eyes: Negative for photophobia, pain, discharge and visual disturbance.  Respiratory: Negative for apnea and wheezing.  Cardiovascular: Negative for  leg swelling.  Gastrointestinal: Negative for nausea, vomiting, abdominal pain, diarrhea, constipation, blood in stool and abdominal distention.  Genitourinary: Negative for dysuria, urgency, frequency, hematuria and decreased urine volume.  Musculoskeletal: Negative for myalgias, back pain, joint swelling, arthralgias and gait problem.  Skin: Negative for color change, pallor, rash and wound.  Neurological: Negative for dizziness, tremors, seizures, syncope, speech difficulty, weakness, light-headedness, numbness and headaches.  Psychiatric/Behavioral: Negative for suicidal ideas, hallucinations, behavioral problems and agitation. The patient is not nervous/anxious.     PHYSICAL EXAM   BP 124/74  Pulse 68  Ht 6' (1.829 m)  Wt 223 lb 8 oz  (101.379 kg)  BMI 30.31 kg/m2 Constitutional: He is oriented to person, place, and time. He appears well-developed and well-nourished. No distress.  HENT: No nasal discharge.  Head: Normocephalic and atraumatic.  Eyes: Pupils are equal and round. Right eye exhibits no discharge. Left eye exhibits no discharge.  Neck: Normal range of motion. Neck supple. No JVD present. No thyromegaly present.  Cardiovascular: Normal rate, regular rhythm, normal heart sounds and. Exam reveals no gallop and no friction rub. No murmur heard.  Pulmonary/Chest: Effort normal and breath sounds normal. No stridor.  No respiratory distress. He has no wheezes. He has no rales. He exhibits no tenderness.  Abdominal: Soft. Bowel sounds are normal. He exhibits no distension. There is no tenderness. There is no rebound and no guarding.  Musculoskeletal: Normal range of motion. He exhibits no edema and no tenderness.  Neurological: He is alert and oriented to person, place, and time. Coordination normal.  Skin: Skin is warm and dry. No rash noted. He is not diaphoretic. No erythema. No pallor.  Psychiatric: He has a normal mood and affect. His behavior is normal. Judgment and thought content normal.       EKG: Sinus  Rhythm  -Nonspecific QRS widening.   BORDERLINE    ASSESSMENT AND PLAN

## 2012-05-03 ENCOUNTER — Encounter: Payer: Self-pay | Admitting: Cardiovascular Disease

## 2012-05-04 ENCOUNTER — Telehealth: Payer: Self-pay

## 2012-05-04 ENCOUNTER — Encounter: Payer: Self-pay | Admitting: Internal Medicine

## 2012-05-04 NOTE — Telephone Encounter (Signed)
error 

## 2012-05-08 ENCOUNTER — Telehealth: Payer: Self-pay

## 2012-05-08 NOTE — Telephone Encounter (Signed)
Does pt need this test before surg tomm? Pt says he does not need this before surg??

## 2012-05-08 NOTE — Telephone Encounter (Signed)
LMTCB on Heather's VM

## 2012-05-08 NOTE — Telephone Encounter (Signed)
Randy Mclean [phone]) called back from pre admit.  Returning your call. She states you can leave a message, due to she will be with in and out with patients.

## 2012-05-08 NOTE — Telephone Encounter (Signed)
Randy Mclean states that Dr Kirke Corin ordered a CTA, states pt is having surgery tomorrow, but CTA has not been set up, not sure if he needs CTA before surgery. Please advise

## 2012-05-08 NOTE — Telephone Encounter (Signed)
Pt called back Says he was given instructions re:CTA and verb understanding He did not think it was necessary to have CT scan before surgery tomm

## 2012-05-08 NOTE — Telephone Encounter (Signed)
Form was signed by Dr. Kirke Corin I will fax to [facility]

## 2012-05-08 NOTE — Telephone Encounter (Signed)
Randy Mclean informed pt has CTA heart scheduled for 3/24 She needs something in writing stating pt may proceed with surg tomm without having CTA heart done first Says she faxed form last week to Dr. Kirke Corin I will see if we can get this and fax to her at [phone]

## 2012-05-08 NOTE — Telephone Encounter (Signed)
lmtcb on pt's VM making sure he was aware of CTA chest scheduled for 3/24 at [facility]

## 2012-05-09 ENCOUNTER — Ambulatory Visit: Payer: Self-pay | Admitting: Surgery

## 2012-05-09 ENCOUNTER — Ambulatory Visit (HOSPITAL_COMMUNITY): Payer: BC Managed Care – PPO

## 2012-05-10 LAB — PATHOLOGY REPORT

## 2012-05-15 ENCOUNTER — Ambulatory Visit (HOSPITAL_COMMUNITY): Admission: RE | Admit: 2012-05-15 | Payer: BC Managed Care – PPO | Source: Ambulatory Visit

## 2012-05-15 ENCOUNTER — Other Ambulatory Visit: Payer: BC Managed Care – PPO

## 2012-05-16 ENCOUNTER — Telehealth: Payer: Self-pay

## 2012-05-16 NOTE — Telephone Encounter (Signed)
lmtcb re:n/s for cardiac CTA

## 2012-05-18 NOTE — Telephone Encounter (Signed)
Please let PCP know that he did not show up.

## 2012-05-18 NOTE — Telephone Encounter (Signed)
Just FYI It appears pt' cancelled/no showed for cardiac CT I have tried contacting him but have not been able to reach him

## 2012-05-18 NOTE — Telephone Encounter (Signed)
Will fax to Dr. Welton Flakes

## 2012-05-22 ENCOUNTER — Telehealth: Payer: Self-pay

## 2012-05-22 NOTE — Telephone Encounter (Signed)
Pt called me back to explain why he cancelled cardiac CT Says he had hemmrhoidectomy as scheduled last week. He had complications and had to stay overnight was sent home in "excrutiating pain" and was unable to make it to cardiac CT appt He called to cancel this in a timely manner  Pt is going to see surgeon for post op f/u tomorrow He wishes to r/s cardiac CT for another day this week He denies further CP or SOB Main complaint are vasovagal spells during BM Says each time he has BM and bears down, he feels near syncopal and states he had 1 actual syncopal spell during BM last week He is taking stool softeners and miralax  He will address constipation with Dr. Katrinka Blazing tomm. He admits he is also taking prescribed pain meds during day for the pain. I explained this could also constipate him He will address these concerns with Dr. Katrinka Blazing (surgeon) tomm We will call him back with new cardiac CT date/time Understanding verb

## 2012-05-22 NOTE — Telephone Encounter (Signed)
FYI

## 2012-05-23 ENCOUNTER — Telehealth: Payer: Self-pay

## 2012-05-23 NOTE — Telephone Encounter (Signed)
Correction:scheduled for 4/4 at 0900 Pt informed Understanding verb

## 2012-05-23 NOTE — Telephone Encounter (Signed)
lmtcb re:new date for cardiac CT 05/25/12 at 0900

## 2012-05-26 ENCOUNTER — Ambulatory Visit (HOSPITAL_COMMUNITY)
Admission: RE | Admit: 2012-05-26 | Discharge: 2012-05-26 | Disposition: A | Discharge status: Home / Self Care | Payer: BC Managed Care – PPO | Source: Ambulatory Visit | Attending: Cardiovascular Disease | Admitting: Cardiovascular Disease

## 2012-05-26 DIAGNOSIS — R9439 Abnormal result of other cardiovascular function study: Secondary | ICD-10-CM | POA: Insufficient documentation

## 2012-05-26 DIAGNOSIS — R079 Chest pain, unspecified: Secondary | ICD-10-CM | POA: Insufficient documentation

## 2012-05-26 MED ORDER — METOPROLOL TARTRATE 1 MG/ML IV SOLN
INTRAVENOUS | Status: AC
  Administered 2012-05-26: 5 mg via INTRAVENOUS
  Filled 2012-05-26: qty 15

## 2012-05-26 MED ORDER — IOHEXOL 350 MG/ML SOLN
100.0000 mL | Freq: Once | INTRAVENOUS | Status: AC | PRN
  Administered 2012-05-26: 100 mL via INTRAVENOUS

## 2012-05-26 MED ORDER — NITROGLYCERIN 0.4 MG SL SUBL
SUBLINGUAL_TABLET | SUBLINGUAL | Status: AC
  Filled 2012-05-26: qty 25

## 2012-05-26 MED ORDER — NITROGLYCERIN 0.4 MG SL SUBL
SUBLINGUAL_TABLET | SUBLINGUAL | Status: AC
  Administered 2012-05-26: 09:00:00
  Filled 2012-05-26: qty 25

## 2012-05-26 MED ORDER — METOPROLOL TARTRATE 1 MG/ML IV SOLN
5.0000 mg | INTRAVENOUS | Status: DC | PRN
  Administered 2012-05-26: 5 mg via INTRAVENOUS

## 2012-05-30 NOTE — Progress Notes (Signed)
LMTCB

## 2012-05-30 NOTE — Progress Notes (Signed)
Lmtcb; sent copy to Beverely Risen (PCP)

## 2012-05-31 NOTE — Progress Notes (Signed)
Pt informed of normal ct results.

## 2012-09-19 ENCOUNTER — Ambulatory Visit: Payer: Self-pay | Admitting: Oncology

## 2012-09-22 ENCOUNTER — Ambulatory Visit: Payer: Self-pay | Admitting: Oncology

## 2012-10-23 ENCOUNTER — Ambulatory Visit: Payer: Self-pay | Admitting: Oncology

## 2012-11-07 ENCOUNTER — Encounter: Payer: Self-pay | Admitting: *Deleted

## 2012-11-23 ENCOUNTER — Encounter: Payer: Self-pay | Admitting: General Surgery

## 2012-11-23 ENCOUNTER — Inpatient Hospital Stay
Admission: RE | Admit: 2012-11-23 | Discharge: 2012-11-23 | Disposition: A | Discharge status: Home / Self Care | Payer: Self-pay | Source: Ambulatory Visit | Attending: General Surgery | Admitting: General Surgery

## 2012-11-23 ENCOUNTER — Ambulatory Visit (INDEPENDENT_AMBULATORY_CARE_PROVIDER_SITE_OTHER): Payer: BC Managed Care – PPO | Admitting: General Surgery

## 2012-11-23 VITALS — BP 134/68 | HR 74 | Resp 14 | Ht 72.0 in | Wt 233.0 lb

## 2012-11-23 DIAGNOSIS — R2241 Localized swelling, mass and lump, right lower limb: Secondary | ICD-10-CM

## 2012-11-23 DIAGNOSIS — D481 Neoplasm of uncertain behavior of connective and other soft tissue: Secondary | ICD-10-CM

## 2012-11-23 DIAGNOSIS — R2240 Localized swelling, mass and lump, unspecified lower limb: Secondary | ICD-10-CM | POA: Insufficient documentation

## 2012-11-23 DIAGNOSIS — R2242 Localized swelling, mass and lump, left lower limb: Secondary | ICD-10-CM

## 2012-11-23 NOTE — Progress Notes (Deleted)
Patient ID: Randy Mclean, male   DOB: Jul 16, 1960, 52 y.o.   MRN: [id number]  No chief complaint on file.   HPI Randy Mclean is a 52 y.o. male.  Here today for evaluation of palpable masses in both legs that are painful. HPI  Past Medical History  Diagnosis Date  . Anemia   . Arthritis   . Asthma   . Hyperlipidemia   . Hypertension   . Pneumonia   . MI (myocardial infarction)   . Hypothyroid   . Allergy     takes allergy shots  . Chronic pain   . Sinus problem   . Bronchitis   . Hemorrhoid   . Colon polyp     Past Surgical History  Procedure Laterality Date  . Back surgery    . Elbow surgery    . Hemorrhoid surgery    . Lipoma excision Right     leg  . Spinal cord stimulator implant      Family History  Problem Relation Age of Onset  . Hypertension Mother   . Heart attack Father   . Diabetes Father   . Lung disease Mother     Social History History  Substance Use Topics  . Smoking status: Former Smoker -- 1.00 packs/day for 30 years    Types: Cigarettes    Quit date: 02/22/2005  . Smokeless tobacco: Not on file  . Alcohol Use: Yes    Allergies  Allergen Reactions  . Amoxicillin     REACTION: unspecified  . Gabapentin     REACTION: anaphylaxis  . Penicillins     REACTION: unspecified    Current Outpatient Prescriptions  Medication Sig Dispense Refill  . alfuzosin (UROXATRAL) 10 MG 24 hr tablet Take 10 mg by mouth daily.      Marland Kitchen ALPRAZolam (XANAX) 1 MG tablet Take 1 mg by mouth 3 (three) times daily as needed for sleep.      Marland Kitchen buPROPion (WELLBUTRIN XL) 300 MG 24 hr tablet take 1 tablet by mouth once daily  30 tablet  2  . citalopram (CELEXA) 20 MG tablet Take 20 mg by mouth daily.      . Clindamycin Phosphate foam       . diphenhydrAMINE (SOMINEX) 25 MG tablet Take 25 mg by mouth as needed for sleep.      Marland Kitchen doxycycline (DORYX) 100 MG EC tablet Take 50 mg by mouth daily.      Marland Kitchen esomeprazole (NEXIUM) 40 MG packet Take 40 mg by mouth 2 (two) times  daily.      Marland Kitchen l-methylfolate-B6-B12 (METANX) 3-35-2 MG TABS Take 1 tablet by mouth 2 (two) times daily.      . Levothyroxine Sodium (TIROSINT) 150 MCG CAPS Take 1 capsule by mouth daily before breakfast.      . metoprolol succinate (TOPROL-XL) 25 MG 24 hr tablet Take 25 mg by mouth daily.      . modafinil (PROVIGIL) 200 MG tablet Take 200 mg by mouth 2 (two) times daily.      . mometasone-formoterol (DULERA) 100-5 MCG/ACT AERO Inhale 2 puffs into the lungs every 12 (twelve) hours.      Cindra Presume PLUS 10 % SHAM       . oxyCODONE-acetaminophen (PERCOCET) 7.5-325 MG per tablet Take 1 tablet by mouth every 4 (four) hours as needed for pain.      . pregabalin (LYRICA) 50 MG capsule Take 50 mg by mouth 3 (three) times daily.       Marland Kitchen  testosterone cypionate (DEPOTESTOTERONE CYPIONATE) 100 MG/ML injection Inject 200 mg into the muscle every 14 (fourteen) days. For IM use only       No current facility-administered medications for this visit.    Review of Systems Review of Systems  Constitutional: Negative.   Respiratory: Positive for cough and shortness of breath.   Cardiovascular: Negative.   Gastrointestinal: Positive for nausea, vomiting, abdominal pain and blood in stool.    There were no vitals taken for this visit.  Physical Exam Physical Exam  Data Reviewed ***  Assessment    ***    Plan    ***       Randy Mclean 11/23/2012, 2:27 PM

## 2012-11-23 NOTE — Patient Instructions (Addendum)
The patient is aware to call back for any questions or concerns. Obtain Dr Randy Mclean notes and then decide the next steps to take for excision

## 2012-11-23 NOTE — Progress Notes (Signed)
Patient ID: Randy Mclean, male   DOB: [birth year], 52 y.o.   MRN: [id number]  Chief Complaint  Patient presents with  . Mass    both legs     HPI Randy Mclean is a 52 y.o. male here today for an evaluation of palpable mass on both legs the right mass is very painful. Bilateral lower leg masses have been there for about 2 years.  The right mass has been removed twice Dr Gwen Pounds and it has grown back. The left nodule has not changed in size. The right nodule appeared before the left nodule. He states when he walks the pain in the right leg radiates down and up the leg. The discoloration of the right lesion came after second surgery.  HPI  Past Medical History  Diagnosis Date  . Anemia   . Arthritis   . Asthma   . Hyperlipidemia   . Hypertension   . Pneumonia   . MI (myocardial infarction)   . Hypothyroid   . Allergy     takes allergy shots  . Chronic pain   . Sinus problem   . Bronchitis   . Hemorrhoid   . Colon polyp   . Spinal cord stimulator status     Past Surgical History  Procedure Laterality Date  . Back surgery    . Elbow surgery    . Hemorrhoid surgery    . Lipoma excision Right     leg  . Spinal cord stimulator implant      Family History  Problem Relation Age of Onset  . Hypertension Mother   . Heart attack Father   . Diabetes Father   . Lung disease Mother     Social History History  Substance Use Topics  . Smoking status: Former Smoker -- 1.00 packs/day for 30 years    Types: Cigarettes    Quit date: 02/22/2005  . Smokeless tobacco: Never Used  . Alcohol Use: Yes    Allergies  Allergen Reactions  . Amoxicillin     REACTION: unspecified  . Gabapentin     REACTION: anaphylaxis  . Penicillins     REACTION: unspecified    Current Outpatient Prescriptions  Medication Sig Dispense Refill  . alfuzosin (UROXATRAL) 10 MG 24 hr tablet Take 10 mg by mouth daily.      Marland Kitchen ALPRAZolam (XANAX) 1 MG tablet Take 1 mg by mouth 3 (three) times  daily as needed for sleep.      Marland Kitchen buPROPion (WELLBUTRIN XL) 300 MG 24 hr tablet take 1 tablet by mouth once daily  30 tablet  2  . citalopram (CELEXA) 20 MG tablet Take 20 mg by mouth daily.      . Clindamycin Phosphate foam       . diphenhydrAMINE (SOMINEX) 25 MG tablet Take 25 mg by mouth as needed for sleep.      Marland Kitchen doxycycline (DORYX) 100 MG EC tablet Take 50 mg by mouth daily.      Marland Kitchen esomeprazole (NEXIUM) 40 MG packet Take 40 mg by mouth 2 (two) times daily.      Marland Kitchen l-methylfolate-B6-B12 (METANX) 3-35-2 MG TABS Take 1 tablet by mouth 2 (two) times daily.      . Levothyroxine Sodium (TIROSINT) 150 MCG CAPS Take 1 capsule by mouth daily before breakfast.      . metoprolol succinate (TOPROL-XL) 25 MG 24 hr tablet Take 25 mg by mouth daily.      . modafinil (PROVIGIL) 200 MG tablet Take 200 mg  by mouth 2 (two) times daily.      . mometasone-formoterol (DULERA) 100-5 MCG/ACT AERO Inhale 2 puffs into the lungs every 12 (twelve) hours.      Cindra Presume PLUS 10 % SHAM       . oxyCODONE-acetaminophen (PERCOCET) 7.5-325 MG per tablet Take 1 tablet by mouth every 4 (four) hours as needed for pain.      . pregabalin (LYRICA) 50 MG capsule Take 50 mg by mouth 3 (three) times daily.       Marland Kitchen testosterone cypionate (DEPOTESTOTERONE CYPIONATE) 100 MG/ML injection Inject 200 mg into the muscle every 14 (fourteen) days. For IM use only       No current facility-administered medications for this visit.    Review of Systems Review of Systems  Constitutional: Negative.   Respiratory: Positive for cough.   Cardiovascular: Positive for leg swelling. Negative for chest pain and palpitations.  Neurological: Positive for headaches.    Blood pressure 134/68, pulse 74, resp. rate 14, height 6' (1.829 m), weight 233 lb (105.688 kg).  Physical Exam Physical Exam  Constitutional: He is oriented to person, place, and time. He appears well-developed and well-nourished.  Cardiovascular: Normal rate, regular rhythm and  normal heart sounds.   Pulmonary/Chest: Effort normal and breath sounds normal.  Abdominal: Soft.  Neurological: He is alert and oriented to person, place, and time.  Skin: Skin is warm and dry.  Right lateral distal calf a 2 x 2 cm raised brown soft nodule. Left lateral distal calf a 1.5 x 5 mm nodule  5 mm from mid line abdomen 10 cm above umbilical area there is a small skin cyst     Data Reviewed Ultrasound examination of the lower extremity lesion was completed. On the right side in the distal third of the calf level anterolateral aspect 0.86 x 1.25 x 2.7 cm soft tissue mass extends from the dermis down to and appears to partially disrupt the underlying muscle fascia.  On the left leg a less pronounced 0.3 x 0.5 x 0.7 cm lesion is noted in the distal third of the calf with a similar hypoechoic picture. This does not extend to the deep dermis but does appear to abut the underlying muscle fascia.  Assessment    Recurrent nodule of the right leg.     Plan    The patient was amenable to having his records from Dr. Philemon Kingdom office reviewed. Considering the discomfort experienced during the second excision elected benefit from reexcision under anesthesia in an outpatient setting.        Earline Mayotte 11/23/2012, 9:50 PM

## 2012-11-28 ENCOUNTER — Ambulatory Visit: Payer: Self-pay | Admitting: Oncology

## 2012-11-28 LAB — CBC CANCER CENTER
Basophil #: 0.1 x10 3/mm (ref 0.0–0.1)
Basophil %: 1 %
Eosinophil #: 0.1 x10 3/mm (ref 0.0–0.7)
Eosinophil %: 2 %
HCT: 45 % (ref 40.0–52.0)
HGB: 14.6 g/dL (ref 13.0–18.0)
Lymphocyte #: 1.6 x10 3/mm (ref 1.0–3.6)
Lymphocyte %: 26.4 %
MCH: 27.1 pg (ref 26.0–34.0)
MCHC: 32.4 g/dL (ref 32.0–36.0)
MCV: 84 fL (ref 80–100)
Monocyte #: 0.5 x10 3/mm (ref 0.2–1.0)
Monocyte %: 8.8 %
Neutrophil #: 3.7 x10 3/mm (ref 1.4–6.5)
Neutrophil %: 61.8 %
Platelet: 190 x10 3/mm (ref 150–440)
RBC: 5.37 10*6/uL (ref 4.40–5.90)
RDW: 23.5 % — ABNORMAL HIGH (ref 11.5–14.5)
WBC: 6 x10 3/mm (ref 3.8–10.6)

## 2012-11-28 LAB — IRON AND TIBC
Iron Bind.Cap.(Total): 310 ug/dL (ref 250–450)
Iron Saturation: 28 %
Iron: 87 ug/dL (ref 65–175)
Unbound Iron-Bind.Cap.: 223 ug/dL

## 2012-11-28 LAB — LACTATE DEHYDROGENASE: LDH: 224 U/L (ref 85–241)

## 2012-11-28 LAB — FERRITIN: Ferritin (ARMC): 32 ng/mL (ref 8–388)

## 2012-11-28 LAB — FOLATE: Folic Acid: 82.9 ng/mL (ref 3.1–100.0)

## 2012-11-30 ENCOUNTER — Encounter: Payer: Self-pay | Admitting: General Surgery

## 2012-12-05 ENCOUNTER — Encounter: Payer: Self-pay | Admitting: *Deleted

## 2012-12-07 ENCOUNTER — Telehealth: Payer: Self-pay | Admitting: *Deleted

## 2012-12-07 NOTE — Telephone Encounter (Signed)
Patient's surgery has been scheduled for 12-20-12 at [facility]. Paperwork has been mailed to the patient. He will call the office if he has further questions.

## 2012-12-11 ENCOUNTER — Other Ambulatory Visit: Payer: Self-pay | Admitting: General Surgery

## 2012-12-11 DIAGNOSIS — R2241 Localized swelling, mass and lump, right lower limb: Secondary | ICD-10-CM

## 2012-12-13 ENCOUNTER — Ambulatory Visit: Payer: Self-pay | Admitting: General Surgery

## 2012-12-13 ENCOUNTER — Ambulatory Visit: Payer: Self-pay | Admitting: Internal Medicine

## 2012-12-13 ENCOUNTER — Encounter: Payer: Self-pay | Admitting: General Surgery

## 2012-12-14 ENCOUNTER — Telehealth: Payer: Self-pay | Admitting: *Deleted

## 2012-12-14 DIAGNOSIS — R229 Localized swelling, mass and lump, unspecified: Secondary | ICD-10-CM

## 2012-12-14 MED ORDER — CEFADROXIL 500 MG PO CAPS: 500.0000 mg | ORAL_CAPSULE | Freq: Two times a day (BID) | ORAL | Status: DC

## 2012-12-14 NOTE — Telephone Encounter (Signed)
OK per Dr. Lemar Livings, RX sent to pharmacy electronically.

## 2012-12-20 ENCOUNTER — Ambulatory Visit: Payer: Self-pay | Admitting: General Surgery

## 2012-12-20 DIAGNOSIS — D481 Neoplasm of uncertain behavior of connective and other soft tissue: Secondary | ICD-10-CM

## 2012-12-22 LAB — PATHOLOGY REPORT

## 2012-12-23 ENCOUNTER — Ambulatory Visit: Payer: Self-pay | Admitting: Oncology

## 2012-12-25 ENCOUNTER — Encounter: Payer: Self-pay | Admitting: General Surgery

## 2012-12-26 ENCOUNTER — Encounter: Payer: Self-pay | Admitting: General Surgery

## 2012-12-27 ENCOUNTER — Encounter: Payer: Self-pay | Admitting: General Surgery

## 2012-12-27 ENCOUNTER — Ambulatory Visit (INDEPENDENT_AMBULATORY_CARE_PROVIDER_SITE_OTHER): Payer: BC Managed Care – PPO | Admitting: General Surgery

## 2012-12-27 VITALS — BP 124/78 | HR 74 | Temp 97.6°F | Resp 14 | Ht 72.0 in | Wt 227.0 lb

## 2012-12-27 DIAGNOSIS — D2120 Benign neoplasm of connective and other soft tissue of unspecified lower limb, including hip: Secondary | ICD-10-CM

## 2012-12-27 DIAGNOSIS — D2121 Benign neoplasm of connective and other soft tissue of right lower limb, including hip: Secondary | ICD-10-CM

## 2012-12-27 DIAGNOSIS — R2242 Localized swelling, mass and lump, left lower limb: Secondary | ICD-10-CM

## 2012-12-27 DIAGNOSIS — R229 Localized swelling, mass and lump, unspecified: Secondary | ICD-10-CM

## 2012-12-27 DIAGNOSIS — D3613 Benign neoplasm of peripheral nerves and autonomic nervous system of lower limb, including hip: Secondary | ICD-10-CM | POA: Insufficient documentation

## 2012-12-27 NOTE — Progress Notes (Signed)
Patient ID: Randy Mclean, male   DOB: [birth year], 52 y.o.   MRN: [id number]  Chief Complaint  Patient presents with  . Routine Post Op    7-10 day post op excision of mass in the lower right leg    HPI Randy Mclean is a 52 y.o. male who presents for a 7-10 day post op appointment. He underwent an excision of a mass in the lower right leg. The procedure was performed on 12/23/12. The patient states he has had swelling in the area along with some shooting pain. The also mentioned some feeling of heat in the area of the excision site. He denies any fever, chills, vomiting etc. He is still using his pain medication at this time twice daily. He appreciates swelling below the surgical site late the day. The patient reports he is experiencing numbness on the dorsum of the foot. This is likely secondary to resection of the neuroma.  HPI  Past Medical History  Diagnosis Date  . Anemia   . Arthritis   . Asthma   . Hyperlipidemia   . Hypertension   . Pneumonia   . MI (myocardial infarction)   . Hypothyroid   . Allergy     takes allergy shots  . Chronic pain   . Sinus problem   . Bronchitis   . Hemorrhoid   . Colon polyp   . Spinal cord stimulator status     Past Surgical History  Procedure Laterality Date  . Back surgery    . Elbow surgery    . Hemorrhoid surgery    . Lipoma excision Right     leg  . Spinal cord stimulator implant      Family History  Problem Relation Age of Onset  . Hypertension Mother   . Heart attack Father   . Diabetes Father   . Lung disease Mother     Social History History  Substance Use Topics  . Smoking status: Former Smoker -- 1.00 packs/day for 30 years    Types: Cigarettes    Quit date: 02/22/2005  . Smokeless tobacco: Never Used  . Alcohol Use: Yes    Allergies  Allergen Reactions  . Amoxicillin     REACTION: unspecified  . Gabapentin     REACTION: anaphylaxis  . Penicillins     REACTION: unspecified    Current Outpatient  Prescriptions  Medication Sig Dispense Refill  . alfuzosin (UROXATRAL) 10 MG 24 hr tablet Take 10 mg by mouth daily.      Marland Kitchen ALPRAZolam (XANAX) 1 MG tablet Take 1 mg by mouth 3 (three) times daily as needed for sleep.      Marland Kitchen atorvastatin (LIPITOR) 10 MG tablet Take 1 tablet by mouth daily.      Marland Kitchen azelastine (ASTELIN) 137 MCG/SPRAY nasal spray Place 1-2 sprays into both nostrils as needed.      Marland Kitchen buPROPion (WELLBUTRIN XL) 300 MG 24 hr tablet take 1 tablet by mouth once daily  30 tablet  2  . cefadroxil (DURICEF) 500 MG capsule Take 1 capsule (500 mg total) by mouth 2 (two) times daily.  2 capsule  0  . citalopram (CELEXA) 20 MG tablet Take 20 mg by mouth daily.      . Clindamycin Phosphate foam       . diphenhydrAMINE (SOMINEX) 25 MG tablet Take 25 mg by mouth as needed for sleep.      Marland Kitchen doxycycline (DORYX) 100 MG EC tablet Take 50 mg by mouth daily.      Marland Kitchen  esomeprazole (NEXIUM) 40 MG packet Take 40 mg by mouth 2 (two) times daily.      Marland Kitchen l-methylfolate-B6-B12 (METANX) 3-35-2 MG TABS Take 1 tablet by mouth 2 (two) times daily.      . Levothyroxine Sodium (TIROSINT) 150 MCG CAPS Take 1 capsule by mouth daily before breakfast.      . metoprolol succinate (TOPROL-XL) 25 MG 24 hr tablet Take 25 mg by mouth daily.      . modafinil (PROVIGIL) 200 MG tablet Take 200 mg by mouth 2 (two) times daily.      . mometasone-formoterol (DULERA) 100-5 MCG/ACT AERO Inhale 2 puffs into the lungs every 12 (twelve) hours.      Raylene Miyamoto ER 150 MG TB12 Take 1 tablet by mouth 2 (two) times daily.      Cindra Presume PLUS 10 % SHAM       . oxyCODONE-acetaminophen (PERCOCET) 7.5-325 MG per tablet Take 1 tablet by mouth every 4 (four) hours as needed for pain.      . pregabalin (LYRICA) 50 MG capsule Take 50 mg by mouth 3 (three) times daily.       Marland Kitchen testosterone cypionate (DEPOTESTOTERONE CYPIONATE) 100 MG/ML injection Inject 200 mg into the muscle every 14 (fourteen) days. For IM use only       No current  facility-administered medications for this visit.    Review of Systems Review of Systems  Constitutional: Negative.   Respiratory: Negative.   Cardiovascular: Negative.     Blood pressure 124/78, pulse 74, temperature 97.6 F (36.4 C), temperature source Oral, resp. rate 14, height 6' (1.829 m), weight 227 lb (102.967 kg).  Physical Exam Physical Exam  Constitutional: He is oriented to person, place, and time. He appears well-developed and well-nourished.  Neurological: He is alert and oriented to person, place, and time.  Skin: Skin is warm and dry.       Data Reviewed Pathology showed a traumatic neuroma as well as residual angiolipoma.  Assessment    Doing well status post excision of a neuroma with marked improvement in radicular pain.     Plan    The patient is doing well, and followup at this time will be on an as needed basis.        Earline Mayotte 12/27/2012, 7:57 PM

## 2012-12-27 NOTE — Patient Instructions (Addendum)
Patient to return as needed. 

## 2012-12-28 ENCOUNTER — Other Ambulatory Visit: Payer: Self-pay

## 2013-01-08 ENCOUNTER — Encounter: Payer: Self-pay | Admitting: General Surgery

## 2013-02-09 DIAGNOSIS — T85695A Other mechanical complication of other nervous system device, implant or graft, initial encounter: Secondary | ICD-10-CM | POA: Insufficient documentation

## 2013-03-01 ENCOUNTER — Ambulatory Visit: Payer: Self-pay | Admitting: Oncology

## 2013-03-01 LAB — CBC CANCER CENTER
Basophil #: 0.1 x10 3/mm (ref 0.0–0.1)
Basophil %: 1.2 %
Eosinophil #: 0.2 x10 3/mm (ref 0.0–0.7)
Eosinophil %: 1.6 %
HCT: 44.9 % (ref 40.0–52.0)
HGB: 14.5 g/dL (ref 13.0–18.0)
Lymphocyte #: 2.6 x10 3/mm (ref 1.0–3.6)
Lymphocyte %: 26.6 %
MCH: 28.5 pg (ref 26.0–34.0)
MCHC: 32.3 g/dL (ref 32.0–36.0)
MCV: 88 fL (ref 80–100)
Monocyte #: 0.8 x10 3/mm (ref 0.2–1.0)
Monocyte %: 8.2 %
Neutrophil #: 6.1 x10 3/mm (ref 1.4–6.5)
Neutrophil %: 62.4 %
Platelet: 195 x10 3/mm (ref 150–440)
RBC: 5.09 10*6/uL (ref 4.40–5.90)
RDW: 14.8 % — ABNORMAL HIGH (ref 11.5–14.5)
WBC: 9.8 x10 3/mm (ref 3.8–10.6)

## 2013-03-01 LAB — IRON AND TIBC
Iron Bind.Cap.(Total): 342 ug/dL (ref 250–450)
Iron Saturation: 22 %
Iron: 74 ug/dL (ref 65–175)
Unbound Iron-Bind.Cap.: 268 ug/dL

## 2013-03-01 LAB — FERRITIN: Ferritin (ARMC): 23 ng/mL (ref 8–388)

## 2013-03-25 ENCOUNTER — Ambulatory Visit: Payer: Self-pay | Admitting: Oncology

## 2013-05-07 ENCOUNTER — Encounter: Payer: Self-pay | Admitting: General Surgery

## 2013-05-07 NOTE — Progress Notes (Signed)
Patient ID: Randy Mclean, male   DOB: 12-Feb-1961, 53 y.o.   MRN: [id number]   Procedure note and pathology report from the 03/17/2011 as well as the 10/20/2011 excision of the right lateral lower leg lesion was reviewed. The first pathology showed a lipoma described as 2.1 cm in diameter formalin thick specimen reported as 3 x 8 x 8 mm.  The August 28 exam showed changes consistent with prominent vascular component and mature adipose tissue with the diagnosis of an angiolipoma.   No evidence of malignancy or atypia in either lesion.  The area excised for the third time on 12/21/2012 showed a traumatic neuroma with changes of the previous biopsy as well as residual angiolipoma. No evidence of malignancy.

## 2013-06-28 ENCOUNTER — Ambulatory Visit: Payer: Self-pay | Admitting: Oncology

## 2013-09-22 HISTORY — PX: UPPER GI ENDOSCOPY: SHX6162

## 2013-09-22 HISTORY — PX: COLONOSCOPY: SHX174

## 2013-09-24 ENCOUNTER — Ambulatory Visit: Payer: Self-pay | Admitting: Unknown Physician Specialty

## 2013-09-27 LAB — PATHOLOGY REPORT

## 2013-10-19 ENCOUNTER — Ambulatory Visit: Payer: Self-pay | Admitting: Oncology

## 2013-10-23 ENCOUNTER — Ambulatory Visit: Payer: Self-pay | Admitting: Oncology

## 2014-01-25 ENCOUNTER — Ambulatory Visit: Payer: Self-pay | Admitting: Oncology

## 2014-01-25 LAB — CBC CANCER CENTER
Basophil #: 0.1 x10 3/mm (ref 0.0–0.1)
Basophil %: 0.8 %
Eosinophil #: 0.2 x10 3/mm (ref 0.0–0.7)
Eosinophil %: 1.9 %
HCT: 44.7 % (ref 40.0–52.0)
HGB: 14.5 g/dL (ref 13.0–18.0)
Lymphocyte #: 2.5 x10 3/mm (ref 1.0–3.6)
Lymphocyte %: 30.2 %
MCH: 28.1 pg (ref 26.0–34.0)
MCHC: 32.4 g/dL (ref 32.0–36.0)
MCV: 87 fL (ref 80–100)
Monocyte #: 0.7 x10 3/mm (ref 0.2–1.0)
Monocyte %: 8.9 %
Neutrophil #: 4.8 x10 3/mm (ref 1.4–6.5)
Neutrophil %: 58.2 %
Platelet: 213 x10 3/mm (ref 150–440)
RBC: 5.14 10*6/uL (ref 4.40–5.90)
RDW: 15.6 % — ABNORMAL HIGH (ref 11.5–14.5)
WBC: 8.3 x10 3/mm (ref 3.8–10.6)

## 2014-01-25 LAB — IRON AND TIBC
Iron Bind.Cap.(Total): 307 ug/dL (ref 250–450)
Iron Saturation: 41 %
Iron: 125 ug/dL (ref 65–175)
Unbound Iron-Bind.Cap.: 182 ug/dL

## 2014-01-25 LAB — FERRITIN: Ferritin (ARMC): 29 ng/mL (ref 8–388)

## 2014-02-04 ENCOUNTER — Telehealth: Payer: Self-pay | Admitting: *Deleted

## 2014-02-04 ENCOUNTER — Ambulatory Visit: Payer: Self-pay

## 2014-02-04 NOTE — Telephone Encounter (Signed)
Message left on home and cell numbers for patient to call the office.   We have scheduled him for an appointment on 02-19-14 at 10:45 am.

## 2014-02-04 NOTE — Telephone Encounter (Signed)
Patient called back and notified of appointment day and time. He verbalizes understanding.

## 2014-02-19 ENCOUNTER — Ambulatory Visit (INDEPENDENT_AMBULATORY_CARE_PROVIDER_SITE_OTHER): Payer: BC Managed Care – PPO | Admitting: General Surgery

## 2014-02-19 ENCOUNTER — Encounter: Payer: Self-pay | Admitting: General Surgery

## 2014-02-19 VITALS — BP 130/62 | HR 80 | Resp 16 | Ht 72.0 in | Wt 231.0 lb

## 2014-02-19 DIAGNOSIS — D1730 Benign lipomatous neoplasm of skin and subcutaneous tissue of unspecified sites: Secondary | ICD-10-CM

## 2014-02-19 DIAGNOSIS — R2241 Localized swelling, mass and lump, right lower limb: Secondary | ICD-10-CM

## 2014-02-19 NOTE — Progress Notes (Signed)
Patient ID: Randy Mclean, male   DOB: 24-Jul-1960, 53 y.o.   MRN: [id number]  Chief Complaint  Patient presents with  . Follow-up    evaluation of right lower leg    HPI Randy Mclean is a 53 y.o. male who presents for an evaluation of a right lower leg nodule. He had a nodule that was removed around the same area in November 2015 that was adipose tissue. He noticed it approximately 2 months ago. This area is bothersome. He states it shoots pain down into his toes. He also states it's sensitive to the touch. All of these symptoms started around the same time. In this 2 month period the area hasn't gotten worse or better.   HPI  Past Medical History  Diagnosis Date  . Anemia   . Arthritis   . Asthma   . Hyperlipidemia   . Hypertension   . Pneumonia   . MI (myocardial infarction)   . Hypothyroid   . Allergy     takes allergy shots  . Chronic pain   . Sinus problem   . Bronchitis   . Hemorrhoid   . Colon polyp   . Spinal cord stimulator status     Past Surgical History  Procedure Laterality Date  . Back surgery    . Elbow surgery    . Hemorrhoid surgery    . Lipoma excision Right     leg  . Spinal cord stimulator implant      Family History  Problem Relation Age of Onset  . Hypertension Mother   . Heart attack Father   . Diabetes Father   . Lung disease Mother     Social History History  Substance Use Topics  . Smoking status: Former Smoker -- 1.00 packs/day for 30 years    Types: Cigarettes    Quit date: 02/22/2005  . Smokeless tobacco: Never Used  . Alcohol Use: 0.0 oz/week    0 Not specified per week    Allergies  Allergen Reactions  . Amoxicillin     REACTION: unspecified  . Gabapentin     REACTION: anaphylaxis  . Penicillins     REACTION: unspecified    Current Outpatient Prescriptions  Medication Sig Dispense Refill  . alfuzosin (UROXATRAL) 10 MG 24 hr tablet Take 10 mg by mouth daily.    Marland Kitchen ALPRAZolam (XANAX) 1 MG tablet  Take 1 mg by mouth 3 (three) times daily as needed for sleep.    Marland Kitchen atorvastatin (LIPITOR) 10 MG tablet Take 1 tablet by mouth daily.    Marland Kitchen azelastine (ASTELIN) 137 MCG/SPRAY nasal spray Place 1-2 sprays into both nostrils as needed.    Marland Kitchen buPROPion (WELLBUTRIN XL) 300 MG 24 hr tablet take 1 tablet by mouth once daily 30 tablet 2  . citalopram (CELEXA) 20 MG tablet Take 20 mg by mouth daily.    . Clindamycin Phosphate foam Apply 1-2 application topically 2 (two) times daily.     . diphenhydrAMINE (SOMINEX) 25 MG tablet Take 25 mg by mouth as needed for sleep.    Marland Kitchen doxycycline (DORYX) 100 MG EC tablet Take 50 mg by mouth daily.    Marland Kitchen esomeprazole (NEXIUM) 40 MG packet Take 40 mg by mouth 2 (two) times daily.    Marland Kitchen Fe Cbn-Fe Gluc-FA-B12-C-DSS (FERRALET 90 PO) Take 1 tablet by mouth daily.    . Levothyroxine Sodium (TIROSINT) 150 MCG CAPS Take 1 capsule by mouth daily before breakfast.    . losartan (COZAAR) 50  MG tablet Take 50 mg by mouth daily.    . meloxicam (MOBIC) 7.5 MG tablet Take 7.5 mg by mouth daily.    . metoprolol succinate (TOPROL-XL) 25 MG 24 hr tablet Take 25 mg by mouth daily.    . modafinil (PROVIGIL) 200 MG tablet Take 200 mg by mouth 2 (two) times daily.    . mometasone-formoterol (DULERA) 100-5 MCG/ACT AERO Inhale 2 puffs into the lungs every 12 (twelve) hours.    Gean Birchwood ER 150 MG TB12 Take 1 tablet by mouth 2 (two) times daily.    Mikael Spray PLUS 10 % SHAM     . oxyCODONE-acetaminophen (PERCOCET) 7.5-325 MG per tablet Take 1 tablet by mouth every 4 (four) hours as needed for pain.    . pregabalin (LYRICA) 50 MG capsule Take 50 mg by mouth 3 (three) times daily.     Marland Kitchen testosterone cypionate (DEPOTESTOTERONE CYPIONATE) 100 MG/ML injection Inject 200 mg into the muscle every 14 (fourteen) days. For IM use only    . l-methylfolate-B6-B12 (METANX) 3-35-2 MG TABS Take 1 tablet by mouth 2 (two) times daily.     No current facility-administered medications for this visit.    Review of  Systems Review of Systems  Constitutional: Negative.   Respiratory: Negative.   Cardiovascular: Negative.     Blood pressure 130/62, pulse 80, resp. rate 16, height 6' (1.829 m), weight 231 lb (104.781 kg).  Physical Exam Physical Exam  Constitutional: He is oriented to person, place, and time. He appears well-developed and well-nourished.  Cardiovascular: Normal pulses.   No edema present  Musculoskeletal:       Legs:      Feet:  Neurological: He is alert and oriented to person, place, and time.  Skin: Skin is warm and dry.  3 x 4 swelling at central portion of right lower leg scar.    Data Reviewed 12/21/2012 right lower leg excision: Traumatic neuroma, residual angiolipoma.  Assessment    Recurrent angiolipoma of the lower extremity.    Plan    Patient reports no increase in size over the last several months. He is undergone multiple excisions. Unless the area becomes more symptomatic would recommend against reexploration.    PCP:  Vanetta Shawl 02/22/2014, 8:55 AM

## 2014-02-19 NOTE — Patient Instructions (Signed)
Patient to return in 3 months for follow up. The patient is aware to call back for any questions or concerns.

## 2014-02-22 ENCOUNTER — Ambulatory Visit: Payer: Self-pay | Admitting: Oncology

## 2014-02-22 DIAGNOSIS — D1730 Benign lipomatous neoplasm of skin and subcutaneous tissue of unspecified sites: Secondary | ICD-10-CM | POA: Insufficient documentation

## 2014-03-06 ENCOUNTER — Ambulatory Visit: Payer: Self-pay

## 2014-05-22 ENCOUNTER — Ambulatory Visit (INDEPENDENT_AMBULATORY_CARE_PROVIDER_SITE_OTHER): Payer: BLUE CROSS/BLUE SHIELD | Admitting: General Surgery

## 2014-05-22 ENCOUNTER — Encounter: Payer: Self-pay | Admitting: General Surgery

## 2014-05-22 VITALS — BP 116/74 | HR 78 | Resp 18 | Ht 72.0 in | Wt 253.0 lb

## 2014-05-22 DIAGNOSIS — R2241 Localized swelling, mass and lump, right lower limb: Secondary | ICD-10-CM

## 2014-05-22 NOTE — Progress Notes (Signed)
Patient ID: Randy Mclean, male   DOB: [birth year], 54 y.o.   MRN: [id number]  Chief Complaint  Patient presents with  . Follow-up    right leg nodule    HPI Randy Mclean is a 54 y.o. male here today for his three month follow up right leg nodule. He states the area is still it's sensitive to the touch. HPI  Past Medical History  Diagnosis Date  . Anemia   . Arthritis   . Asthma   . Hyperlipidemia   . Hypertension   . Pneumonia   . MI (myocardial infarction)   . Hypothyroid   . Allergy     takes allergy shots  . Chronic pain   . Sinus problem   . Bronchitis   . Hemorrhoid   . Colon polyp   . Spinal cord stimulator status     Past Surgical History  Procedure Laterality Date  . Back surgery    . Elbow surgery    . Hemorrhoid surgery    . Lipoma excision Right     leg  . Spinal cord stimulator implant      Family History  Problem Relation Age of Onset  . Hypertension Mother   . Heart attack Father   . Diabetes Father   . Lung disease Mother     Social History History  Substance Use Topics  . Smoking status: Former Smoker -- 1.00 packs/day for 30 years    Types: Cigarettes    Quit date: 02/22/2005  . Smokeless tobacco: Never Used  . Alcohol Use: 0.0 oz/week    0 Standard drinks or equivalent per week    Allergies  Allergen Reactions  . Amoxicillin     REACTION: unspecified  . Gabapentin     REACTION: anaphylaxis  . Penicillins     REACTION: unspecified    Current Outpatient Prescriptions  Medication Sig Dispense Refill  . alfuzosin (UROXATRAL) 10 MG 24 hr tablet Take 10 mg by mouth daily.    Marland Kitchen ALPRAZolam (XANAX) 1 MG tablet Take 1 mg by mouth 3 (three) times daily as needed for sleep.    Marland Kitchen atorvastatin (LIPITOR) 10 MG tablet Take 1 tablet by mouth daily.    Marland Kitchen azelastine (ASTELIN) 137 MCG/SPRAY nasal spray Place 1-2 sprays into both nostrils as needed.    Marland Kitchen buPROPion (WELLBUTRIN XL) 300 MG 24 hr tablet take 1 tablet by mouth once  daily 30 tablet 2  . citalopram (CELEXA) 20 MG tablet Take 20 mg by mouth daily.    . Clindamycin Phosphate foam Apply 1-2 application topically 2 (two) times daily.     . diphenhydrAMINE (SOMINEX) 25 MG tablet Take 25 mg by mouth as needed for sleep.    Marland Kitchen doxycycline (DORYX) 100 MG EC tablet Take 50 mg by mouth daily.    Marland Kitchen esomeprazole (NEXIUM) 40 MG packet Take 40 mg by mouth 2 (two) times daily.    Marland Kitchen Fe Cbn-Fe Gluc-FA-B12-C-DSS (FERRALET 90 PO) Take 1 tablet by mouth daily.    Marland Kitchen l-methylfolate-B6-B12 (METANX) 3-35-2 MG TABS Take 1 tablet by mouth 2 (two) times daily.    . Levothyroxine Sodium (TIROSINT) 150 MCG CAPS Take 1 capsule by mouth daily before breakfast.    . losartan (COZAAR) 50 MG tablet Take 50 mg by mouth daily.    . meloxicam (MOBIC) 7.5 MG tablet Take 7.5 mg by mouth daily.    . metoprolol succinate (TOPROL-XL) 25 MG 24 hr tablet Take 25 mg  by mouth daily.    . modafinil (PROVIGIL) 200 MG tablet Take 200 mg by mouth 2 (two) times daily.    . mometasone-formoterol (DULERA) 100-5 MCG/ACT AERO Inhale 2 puffs into the lungs every 12 (twelve) hours.    Gean Birchwood ER 150 MG TB12 Take 1 tablet by mouth 2 (two) times daily.    Mikael Spray PLUS 10 % SHAM     . oxyCODONE-acetaminophen (PERCOCET) 7.5-325 MG per tablet Take 1 tablet by mouth every 4 (four) hours as needed for pain.    . pregabalin (LYRICA) 50 MG capsule Take 50 mg by mouth 3 (three) times daily.     Marland Kitchen testosterone cypionate (DEPOTESTOTERONE CYPIONATE) 100 MG/ML injection Inject 200 mg into the muscle every 14 (fourteen) days. For IM use only     No current facility-administered medications for this visit.    Review of Systems Review of Systems  Constitutional: Negative.   Respiratory: Negative.   Cardiovascular: Negative.     Blood pressure 116/74, pulse 78, resp. rate 18, height 6' (1.829 m), weight 253 lb (114.76 kg).  Physical Exam Physical Exam  Musculoskeletal:       Legs:      Feet:    Data  Reviewed 12/21/2012 pathology showed a traumatic neuroma and residual angiolipoma.  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    Plan    In general the patient's doing well. He's had 3 resections and any further surgery would require general anesthesia. He does have a small area of numbness cyst persisted since his October 2014 excision of the traumatic neuroma, with this is not symptomatic. The sharp stabbing pains in the lateral aspect of the foot of resolved. He is concerned that the area may increase when he resumes an exercise program. He is going to make use of a compressive wrap lightly applied to this area to see if this helps minimize recurrence. Should he develop more symptoms, local pain or prominent swelling we can discuss reexcision, although his track record has not been particularly encouraging in terms of relief of symptoms in the long-term.  Patient to return as needed.    PCP:  Vanetta Shawl 05/23/2014, 4:03 PM

## 2014-05-22 NOTE — Patient Instructions (Signed)
Patient to return as needed. 

## 2014-05-29 ENCOUNTER — Other Ambulatory Visit
Admit: 2014-05-29 | Disposition: A | Discharge status: Home / Self Care | Payer: Self-pay | Attending: Nurse Practitioner | Admitting: Nurse Practitioner

## 2014-05-29 ENCOUNTER — Ambulatory Visit
Admit: 2014-05-29 | Disposition: A | Discharge status: Home / Self Care | Payer: Self-pay | Attending: Oncology | Admitting: Oncology

## 2014-05-29 LAB — CBC CANCER CENTER
Basophil #: 0.1 x10 3/mm (ref 0.0–0.1)
Basophil %: 1.1 %
Eosinophil #: 0.1 x10 3/mm (ref 0.0–0.7)
Eosinophil %: 2 %
HCT: 45.2 % (ref 40.0–52.0)
HGB: 14.7 g/dL (ref 13.0–18.0)
Lymphocyte #: 2.4 x10 3/mm (ref 1.0–3.6)
Lymphocyte %: 32.2 %
MCH: 28 pg (ref 26.0–34.0)
MCHC: 32.6 g/dL (ref 32.0–36.0)
MCV: 86 fL (ref 80–100)
Monocyte #: 0.8 x10 3/mm (ref 0.2–1.0)
Monocyte %: 10.7 %
Neutrophil #: 4.1 x10 3/mm (ref 1.4–6.5)
Neutrophil %: 54 %
Platelet: 183 x10 3/mm (ref 150–440)
RBC: 5.25 10*6/uL (ref 4.40–5.90)
RDW: 14.1 % (ref 11.5–14.5)
WBC: 7.5 x10 3/mm (ref 3.8–10.6)

## 2014-05-29 LAB — IRON AND TIBC
Iron Bind.Cap.(Total): 372 (ref 250–450)
Iron Saturation: 51
Iron: 190 ug/dL — ABNORMAL HIGH
Unbound Iron-Bind.Cap.: 182.3

## 2014-05-29 LAB — TSH: Thyroid Stimulating Horm: 0.155 u[IU]/mL — ABNORMAL LOW

## 2014-05-29 LAB — FERRITIN: Ferritin (ARMC): 16 ng/mL — ABNORMAL LOW

## 2014-05-29 LAB — T4, FREE: Free Thyroxine: 0.87 ng/dL

## 2014-06-14 NOTE — Op Note (Signed)
PATIENT NAME:  Randy Mclean, Randy Mclean MR#:  [id number] DATE OF BIRTH:  August 06, 1960  DATE OF PROCEDURE:  12/20/2012  PREOPERATIVE DIAGNOSIS: Recurrent right calf mass.   POSTOPERATIVE DIAGNOSIS:  Recurrent right calf mass.  OPERATIVE PROCEDURE: Excision right calf mass.   OPERATING SURGEON:  Hervey Ard, MD.   ANESTHESIA: General by LMA, Marcaine 0.5% plain, 17 mL local infiltration.   CLINICAL NOTE: This 54 year old male has previously had an angiolipoma excised from the distal aspect of the right calf on two occasions. It has recurred each time and is increasingly symptomatic. This is associated with pain radiating from the area suggesting a possible neural involvement. He is brought to the operating room for planned re-excision.   OPERATIVE NOTE: With the patient under adequate general anesthesia, the area was clipped of hair and prepped with ChloraPrep and draped. Marcaine was infiltrated for postoperative analgesia. The 12 mm area of discolored skin overlying the mass was excised through an elliptical incision approximately 6 cm in length. This was carried down through the skin and subcutaneous tissue. The mass itself appeared to penetrate the fascia and extend down to but not through the underlying muscle. After the area was excised and the specimen orientated it was sent for routine histology. The soft tissue was approximated with interrupted 3-0 Vicryl sutures. The skin was closed with interrupted 4-0 Prolene sutures. A compressive dressing with Telfa and gauze followed by a Kerlix wrap and light Ace wrap was applied.   The patient tolerated the procedure well and was taken to the recovery room in stable condition.     ____________________________ Robert Bellow, MD jwb:dp D: 12/23/2012 09:25:48 ET T: 12/23/2012 09:57:26 ET JOB#: [id number]  cc: Robert Bellow, MD, <Dictator> Lavera Guise, MD Jabes Primo Amedeo Kinsman MD ELECTRONICALLY SIGNED 12/23/2012 14:06

## 2014-06-14 NOTE — Op Note (Signed)
PATIENT NAME:  Randy Mclean, Randy Mclean MR#:  [id number] DATE OF BIRTH:  1960/10/03  DATE OF PROCEDURE:  05/09/2012.   PREOPERATIVE DIAGNOSIS: Hemorrhoids.   POSTOPERATIVE DIAGNOSIS: Hemorrhoids.   PROCEDURE: Internal and external hemorrhoidectomy.   SURGEON: Rochel Brome, M.D.   ANESTHESIA: General.   INDICATIONS: This 54 year old male has a history of rectal bleeding and anal pain, and physical findings of large internal and external hemorrhoids, and surgery was recommended for definitive treatment.   DESCRIPTION OF PROCEDURE: The patient was placed on the operating table in the supine position under general anesthesia. The legs were elevated into the lithotomy position using ankle straps. The anal area was prepared with Betadine and draped in sterile towels and sheets. The anal area was examined. There was a circumferential mass of soft external hemorrhoidal tissue. The anal canal was dilated large enough to admit 3 fingers. The bivalved anal retractor was introduced. There were large internal hemorrhoids identified. Next, the anoderm was infiltrated with 0.5% Sensorcaine with epinephrine. The anal canal was further gently dilated. The largest hemorrhoid complex was at the 3 o'clock position. A high ligation of the internal component was done with a 2-0 chromic suture ligature. A V-shaped incision was made externally, and then electrocautery was used to dissect the hemorrhoids away from the surrounding subcutaneous tissues. Dissection was carried up over the internal sphincter, up to the previously placed suture ligature where the hemorrhoid was again ligated with the same ligature and excised. The wound was inspected. Several small bleeding points were cauterized. Next, the wound was closed with a running, locked, tied 2-0 chromic stitch, leaving a small opening externally for drainage.   Next, a similar procedure was carried out at the 11 o'clock position.   Similar procedure was carried out at the 8  o'clock position.   A similar procedure was carried out at the 6 o'clock position, so there was a total of 4 internal and external hemorrhoids which were removed, and all sent for routine pathology. There was another internal hemorrhoid at the 5 o'clock position which was ligated with a 2-0 chromic.  I could see that there was some remaining external hemorrhoidal tissue, but it appeared that removal of additional tissue may compromise the lumen.   Next, the site was further prepared with Betadine and the subcutaneous tissues and deeper tissues were infiltrated with 20 mL of Exparel. Following this, dressings were applied with paper tape. The patient tolerated the surgery satisfactorily and was prepared for transfer to the recovery room.    ____________________________ Lenna Sciara. Rochel Brome, MD jws:dm D: 05/09/2012 09:03:00 ET T: 05/09/2012 10:09:35 ET JOB#: [id number]  cc: Loreli Dollar, MD, <Dictator> Loreli Dollar MD ELECTRONICALLY SIGNED 05/10/2012 18:04

## 2014-06-14 NOTE — Op Note (Signed)
PATIENT NAME:  Randy Mclean, YOM MR#:  [id number] DATE OF BIRTH:  Oct 21, 1960  DATE OF PROCEDURE:  12/20/2012  PREOPERATIVE DIAGNOSIS: Recurrent right calf mass.   POSTOPERATIVE DIAGNOSIS:  Recurrent right calf mass.  OPERATIVE PROCEDURE: Excision right calf mass.   OPERATING SURGEON:  Hervey Ard, MD.   ANESTHESIA: General by LMA, Marcaine 0.5% plain, 17 mL local infiltration.   CLINICAL NOTE: This 54 year old male has previously had an angiolipoma excised from the distal aspect of the right calf on two occasions. It has recurred each time and is increasingly symptomatic. This is associated with pain radiating from the area suggesting a possible neural involvement. He is brought to the operating room for planned re-excision.   OPERATIVE NOTE: With the patient under adequate general anesthesia, the area was clipped of hair and prepped with ChloraPrep and draped. Marcaine was infiltrated for postoperative analgesia. The 12 mm area of discolored skin overlying the mass was excised through an elliptical incision approximately 6 cm in length. This was carried down through the skin and subcutaneous tissue. The mass itself appeared to penetrate the fascia and extend down to but not through the underlying muscle. After the area was excised and the specimen orientated it was sent for routine histology. The soft tissue was approximated with interrupted 3-0 Vicryl sutures. The skin was closed with interrupted 4-0 Prolene sutures. A compressive dressing with Telfa and gauze followed by a Kerlix wrap and light Ace wrap was applied.   The patient tolerated the procedure well and was taken to the recovery room in stable condition.     ____________________________ Robert Bellow, MD jwb:dp D: 12/23/2012 09:25:48 ET T: 12/23/2012 09:57:26 ET JOB#: [id number]  cc: Robert Bellow, MD, <Dictator> Lavera Guise, MD JEFFREY Amedeo Kinsman MD ELECTRONICALLY SIGNED 12/23/2012 14:06

## 2014-06-16 NOTE — Op Note (Signed)
PATIENT NAME:  Randy Mclean, Randy Mclean MR#:  [id number] DATE OF BIRTH:  [birth year]  DATE OF PROCEDURE:  03/11/2011  PREOPERATIVE DIAGNOSIS: Cubital tunnel syndrome, left arm.   POSTOPERATIVE DIAGNOSIS: Cubital tunnel syndrome, left arm.   PROCEDURE PERFORMED: Subcutaneous ulnar nerve anterior transposition.   SURGEON: Laurene Footman, MD   ANESTHESIA: General.   DESCRIPTION OF PROCEDURE: The patient was brought to the operating room and after adequate anesthesia was obtained, the left arm was prepped and draped in the usual sterile fashion. After prepping and draping in the usual sterile manner, appropriate patient identification and time-out procedures were completed. The patient had a prior ORIF of the medial condyle as a child and the prior scar was utilized with extension slightly distally and incision that was slightly posterior to the incision. Incision was carried down through the subcutaneous tissue and flap elevated anteriorly for subcutaneous transposition. After this had been completed and hemostasis being achieved during the procedure with electrocautery, the cubital tunnel was identified and opened. The nerve was freed up behind this and there was significant hourglass constriction over about a 2 cm area in the cubital tunnel consistent with chronic constriction of the nerve. After releasing the nerve proximally and excising a portion of the intermuscular septum, the nerve was exposed distally and between the heads of the FCU until it could be mobilized adequately and brought anteriorly. A flap of connective tissue from posterior was brought anterior and this held the nerve in an anterior position such that it would not re-sublux in extension. The elbow was placed through a full range of motion and there did not appear to be any tethering, pinching, or constriction on the nerve. With adequate decompression having felt to have been obtained and anterior transposition, the wound was thoroughly  irrigated. Hemostasis was again checked with electrocautery and the anterior sling was sutured together with 0 Vicryl and again the elbow placed through a range of motion. 2-0 Vicryl was then used in a running fashion followed by 4-0 nylon in a simple interrupted manner. Xeroform, 4 x 4's, Webril, and posterior splint were applied. The patient was sent to the recovery room in stable condition.   ESTIMATED BLOOD LOSS: 25 mL.   COMPLICATIONS: None.   SPECIMEN: None.   TOURNIQUET: No tourniquet was required.    CONDITION: To recovery room stable.  ____________________________ Laurene Footman, MD mjm:drc D: 03/11/2011 16:21:13 ET T: 03/11/2011 17:03:18 ET JOB#: [id number]  cc: Laurene Footman, MD, <Dictator> Laurene Footman MD ELECTRONICALLY SIGNED 03/12/2011 10:37

## 2014-09-17 ENCOUNTER — Other Ambulatory Visit: Payer: Self-pay | Admitting: Oncology

## 2014-09-17 DIAGNOSIS — D509 Iron deficiency anemia, unspecified: Secondary | ICD-10-CM

## 2014-09-24 ENCOUNTER — Other Ambulatory Visit: Payer: Self-pay | Admitting: *Deleted

## 2014-09-24 DIAGNOSIS — D509 Iron deficiency anemia, unspecified: Secondary | ICD-10-CM

## 2014-09-25 ENCOUNTER — Inpatient Hospital Stay: Payer: BLUE CROSS/BLUE SHIELD

## 2014-09-25 ENCOUNTER — Inpatient Hospital Stay: Payer: BLUE CROSS/BLUE SHIELD | Attending: Oncology

## 2014-09-25 ENCOUNTER — Inpatient Hospital Stay (HOSPITAL_BASED_OUTPATIENT_CLINIC_OR_DEPARTMENT_OTHER): Payer: BLUE CROSS/BLUE SHIELD | Admitting: Oncology

## 2014-09-25 VITALS — BP 156/90 | HR 76 | Temp 97.6°F | Resp 18 | Wt 216.7 lb

## 2014-09-25 DIAGNOSIS — Z8669 Personal history of other diseases of the nervous system and sense organs: Secondary | ICD-10-CM | POA: Diagnosis not present

## 2014-09-25 DIAGNOSIS — Z800 Family history of malignant neoplasm of digestive organs: Secondary | ICD-10-CM

## 2014-09-25 DIAGNOSIS — K219 Gastro-esophageal reflux disease without esophagitis: Secondary | ICD-10-CM | POA: Insufficient documentation

## 2014-09-25 DIAGNOSIS — M129 Arthropathy, unspecified: Secondary | ICD-10-CM

## 2014-09-25 DIAGNOSIS — G8929 Other chronic pain: Secondary | ICD-10-CM | POA: Diagnosis not present

## 2014-09-25 DIAGNOSIS — E785 Hyperlipidemia, unspecified: Secondary | ICD-10-CM

## 2014-09-25 DIAGNOSIS — E039 Hypothyroidism, unspecified: Secondary | ICD-10-CM | POA: Diagnosis not present

## 2014-09-25 DIAGNOSIS — I10 Essential (primary) hypertension: Secondary | ICD-10-CM | POA: Diagnosis not present

## 2014-09-25 DIAGNOSIS — Z86010 Personal history of colonic polyps: Secondary | ICD-10-CM | POA: Diagnosis not present

## 2014-09-25 DIAGNOSIS — Z87891 Personal history of nicotine dependence: Secondary | ICD-10-CM | POA: Insufficient documentation

## 2014-09-25 DIAGNOSIS — K2270 Barrett's esophagus without dysplasia: Secondary | ICD-10-CM | POA: Diagnosis not present

## 2014-09-25 DIAGNOSIS — Z87442 Personal history of urinary calculi: Secondary | ICD-10-CM

## 2014-09-25 DIAGNOSIS — D509 Iron deficiency anemia, unspecified: Secondary | ICD-10-CM

## 2014-09-25 DIAGNOSIS — K589 Irritable bowel syndrome without diarrhea: Secondary | ICD-10-CM | POA: Insufficient documentation

## 2014-09-25 DIAGNOSIS — F329 Major depressive disorder, single episode, unspecified: Secondary | ICD-10-CM

## 2014-09-25 DIAGNOSIS — J45909 Unspecified asthma, uncomplicated: Secondary | ICD-10-CM | POA: Insufficient documentation

## 2014-09-25 DIAGNOSIS — Z8701 Personal history of pneumonia (recurrent): Secondary | ICD-10-CM | POA: Diagnosis not present

## 2014-09-25 DIAGNOSIS — I252 Old myocardial infarction: Secondary | ICD-10-CM | POA: Diagnosis not present

## 2014-09-25 LAB — CBC WITH DIFFERENTIAL/PLATELET
Basophils Absolute: 0.1 10*3/uL (ref 0–0.1)
Basophils Relative: 1 %
Eosinophils Absolute: 0.1 10*3/uL (ref 0–0.7)
Eosinophils Relative: 2 %
HCT: 43.2 % (ref 40.0–52.0)
Hemoglobin: 14.2 g/dL (ref 13.0–18.0)
Lymphocytes Relative: 39 %
Lymphs Abs: 2.4 10*3/uL (ref 1.0–3.6)
MCH: 27.7 pg (ref 26.0–34.0)
MCHC: 32.8 g/dL (ref 32.0–36.0)
MCV: 84.4 fL (ref 80.0–100.0)
Monocytes Absolute: 0.5 10*3/uL (ref 0.2–1.0)
Monocytes Relative: 9 %
Neutro Abs: 3.1 10*3/uL (ref 1.4–6.5)
Neutrophils Relative %: 49 %
Platelets: 214 10*3/uL (ref 150–440)
RBC: 5.12 MIL/uL (ref 4.40–5.90)
RDW: 14 % (ref 11.5–14.5)
WBC: 6.3 10*3/uL (ref 3.8–10.6)

## 2014-09-25 LAB — IRON AND TIBC
Iron: 91 ug/dL (ref 45–182)
Saturation Ratios: 25 % (ref 17.9–39.5)
TIBC: 371 ug/dL (ref 250–450)
UIBC: 280 ug/dL

## 2014-09-25 LAB — FERRITIN: Ferritin: 11 ng/mL — ABNORMAL LOW (ref 24–336)

## 2014-09-25 NOTE — Progress Notes (Signed)
Patient here today for follow up regarding anemia. Patient denies any concerns today. 

## 2014-09-30 NOTE — Progress Notes (Signed)
Randy Mclean  [phone] Fax:(336) [phone]  ID: Randy Mclean Younger OB: 07-17-1960  MR#: [id number]  KVQ#:[id number]  Patient Care Team: Lavera Guise, MD as PCP - General (Internal Medicine) Robert Bellow, MD (General Surgery) Lavera Guise, MD (Internal Medicine)  CHIEF COMPLAINT:  Chief Complaint  Patient presents with  . Follow-up    anemia    INTERVAL HISTORY: Patient returns to clinic today for repeat laboratory work and further evaluation. He continues to feel well and remains asymptomatic. He has no neurologic complaints.  He denies any recent fevers.  He denies any chest pain or shortness of breath.  He has a good appetite and denies weight loss.  He denies any nausea, vomiting, constipation, or diarrhea.  He has no melena or hematochezia.  He has no urinary complaints.  Patient offers no specific complaints today.  REVIEW OF SYSTEMS:   Review of Systems  Constitutional: Negative for malaise/fatigue.  Respiratory: Negative.   Cardiovascular: Negative.   Gastrointestinal: Negative.   Neurological: Negative for weakness.    As per HPI. Otherwise, a complete review of systems is negatve.  PAST MEDICAL HISTORY: Past Medical History  Diagnosis Date  . Anemia   . Arthritis   . Asthma   . Hyperlipidemia   . Hypertension   . Pneumonia   . MI (myocardial infarction)   . Allergy     takes allergy shots  . Chronic pain   . Sinus problem   . Bronchitis   . Colon polyp   . Spinal cord stimulator status   . Hemorrhoid   . IBS (irritable bowel syndrome)   . GERD (gastroesophageal reflux disease)   . Barrett esophagus   . Kidney stone   . Hypothyroid   . Depression   . Seizures   . Migraine     PAST SURGICAL HISTORY: Past Surgical History  Procedure Laterality Date  . Back surgery    . Elbow surgery    . Hemorrhoid surgery    . Lipoma excision Right     leg  . Spinal cord stimulator implant    . Multiple leg surgery to  remove angiolipoma      FAMILY HISTORY Family History  Problem Relation Age of Onset  . Hypertension Mother   . Heart attack Father   . Diabetes Father   . Lung disease Mother        ADVANCED DIRECTIVES:    HEALTH MAINTENANCE: History  Substance Use Topics  . Smoking status: Former Smoker -- 1.00 packs/day for 30 years    Types: Cigarettes    Quit date: 02/22/2005  . Smokeless tobacco: Never Used  . Alcohol Use: 0.0 oz/week    0 Standard drinks or equivalent per week     Colonoscopy:  PAP:  Bone density:  Lipid panel:  Allergies  Allergen Reactions  . Amoxicillin Anaphylaxis    REACTION: unspecified  . Penicillin G Anaphylaxis  . Gabapentin Other (See Comments)    REACTION: anaphylaxis  . Penicillins     REACTION: unspecified  . Adhesive [Tape] Rash    Band-aid    Current Outpatient Prescriptions  Medication Sig Dispense Refill  . alfuzosin (UROXATRAL) 10 MG 24 hr tablet Take 10 mg by mouth daily.    Marland Kitchen ALPRAZolam (XANAX) 1 MG tablet Take 1 mg by mouth 3 (three) times daily as needed for sleep.    Marland Kitchen atorvastatin (LIPITOR) 10 MG tablet Take 1 tablet by mouth daily.    Marland Kitchen  azelastine (ASTELIN) 137 MCG/SPRAY nasal spray Place 1-2 sprays into both nostrils as needed.    Marland Kitchen buPROPion (WELLBUTRIN XL) 300 MG 24 hr tablet take 1 tablet by mouth once daily 30 tablet 2  . citalopram (CELEXA) 20 MG tablet Take 20 mg by mouth daily.    . Clindamycin Phosphate foam Apply 1-2 application topically 2 (two) times daily.     . diphenhydrAMINE (SOMINEX) 25 MG tablet Take 25 mg by mouth as needed for sleep.    Marland Kitchen doxycycline (DORYX) 100 MG EC tablet Take 50 mg by mouth daily.    Marland Kitchen esomeprazole (NEXIUM) 40 MG packet Take 40 mg by mouth 2 (two) times daily.    Marland Kitchen Fe Cbn-Fe Gluc-FA-B12-C-DSS (FERRALET 90 PO) Take 1 tablet by mouth daily.    Marland Kitchen HYDROcodone-acetaminophen (NORCO/VICODIN) 5-325 MG per tablet Take 1 tablet by mouth every 4 (four) hours as needed for moderate pain. Q4-6H    .  l-methylfolate-B6-B12 (METANX) 3-35-2 MG TABS Take 1 tablet by mouth 2 (two) times daily.    . Levothyroxine Sodium (TIROSINT) 150 MCG CAPS Take 1 capsule by mouth daily before breakfast.    . losartan (COZAAR) 50 MG tablet Take 50 mg by mouth daily.    . meloxicam (MOBIC) 7.5 MG tablet Take 7.5 mg by mouth daily.    . metoprolol succinate (TOPROL-XL) 25 MG 24 hr tablet Take 25 mg by mouth daily.    . metoprolol tartrate (LOPRESSOR) 25 MG tablet Take 25 mg by mouth at bedtime.    . modafinil (PROVIGIL) 200 MG tablet Take 200 mg by mouth 2 (two) times daily.    . mometasone-formoterol (DULERA) 100-5 MCG/ACT AERO Inhale 2 puffs into the lungs every 12 (twelve) hours.    Gean Birchwood ER 150 MG TB12 Take 1 tablet by mouth 2 (two) times daily.    Mikael Spray PLUS 10 % SHAM     . oxyCODONE-acetaminophen (PERCOCET) 7.5-325 MG per tablet Take 1 tablet by mouth every 4 (four) hours as needed for pain.    . pregabalin (LYRICA) 50 MG capsule Take 50 mg by mouth 3 (three) times daily.     Marland Kitchen testosterone cypionate (DEPOTESTOTERONE CYPIONATE) 100 MG/ML injection Inject 200 mg into the muscle every 14 (fourteen) days. For IM use only     No current facility-administered medications for this visit.    OBJECTIVE: Filed Vitals:   09/25/14 1534  BP: 156/90  Pulse: 76  Temp: 97.6 F (36.4 C)  Resp: 18     Body mass index is 29.39 kg/(m^2).    ECOG FS:0 - Asymptomatic  General: Well-developed, well-nourished, no acute distress. Eyes: Pink conjunctiva, anicteric sclera. Lungs: Clear to auscultation bilaterally. Heart: Regular rate and rhythm. No rubs, murmurs, or gallops. Abdomen: Soft, nontender, nondistended. No organomegaly noted, normoactive bowel sounds. Musculoskeletal: No edema, cyanosis, or clubbing. Neuro: Alert, answering all questions appropriately. Cranial nerves grossly intact. Skin: No rashes or petechiae noted. Psych: Normal affect.   LAB RESULTS:  Lab Results  Component Value Date   NA  142 04/17/2009   K 3.7 04/17/2009   CL 109 04/17/2009   CO2 30 04/17/2009   GLUCOSE 95 04/17/2009   BUN 6 04/17/2009   CREATININE 1.2 04/17/2009   CALCIUM 8.8 04/17/2009   PROT 7.0 04/17/2009   ALBUMIN 3.9 04/17/2009   AST 20 04/17/2009   ALT 20 04/17/2009   ALKPHOS 54 04/17/2009   BILITOT 0.0* 04/17/2009   GFRNONAA 69.21 04/17/2009   GFRAA 94 10/31/2006  Lab Results  Component Value Date   WBC 6.3 09/25/2014   NEUTROABS 3.1 09/25/2014   HGB 14.2 09/25/2014   HCT 43.2 09/25/2014   MCV 84.4 09/25/2014   PLT 214 09/25/2014     STUDIES: No results found.  ASSESSMENT: Iron deficiency anemia.  PLAN:    1.  Iron deficiency anemia: Patient's hemoglobin and iron stores continue to be within normal limits.  Previously, the remainder of his laboratory work was either negative or within normal limits. No intervention is needed at this time. Patient last received IV Feraheme in August 2015.   He has not required treatment in 12 months, therefore he can be discharged from clinic. Please refer patient back if his hemoglobin or iron stores trend down. 2. Family history of colon cancer:  Patient gets routine colonoscopies given his family history of colon cancer, all of which been unrevealing.  His genetic testing is negative.  Patient expressed understanding and was in agreement with this plan. He also understands that He can call clinic at any time with any questions, concerns, or complaints.    Lloyd Huger, MD   09/30/2014 5:00 PM

## 2015-02-27 ENCOUNTER — Other Ambulatory Visit: Payer: Self-pay | Admitting: Anesthesiology

## 2015-02-27 ENCOUNTER — Ambulatory Visit
Admission: RE | Admit: 2015-02-27 | Discharge: 2015-02-27 | Disposition: A | Discharge status: Home / Self Care | Payer: BLUE CROSS/BLUE SHIELD | Source: Ambulatory Visit | Attending: Physician Assistant | Admitting: Physician Assistant

## 2015-02-27 ENCOUNTER — Other Ambulatory Visit: Payer: Self-pay | Admitting: Physician Assistant

## 2015-02-27 ENCOUNTER — Ambulatory Visit
Admission: RE | Admit: 2015-02-27 | Discharge: 2015-02-27 | Disposition: A | Discharge status: Home / Self Care | Payer: BLUE CROSS/BLUE SHIELD | Source: Ambulatory Visit | Attending: Anesthesiology | Admitting: Anesthesiology

## 2015-02-27 DIAGNOSIS — R05 Cough: Secondary | ICD-10-CM | POA: Insufficient documentation

## 2015-02-27 DIAGNOSIS — M25552 Pain in left hip: Secondary | ICD-10-CM | POA: Insufficient documentation

## 2015-02-27 DIAGNOSIS — R52 Pain, unspecified: Secondary | ICD-10-CM

## 2015-02-27 DIAGNOSIS — R059 Cough, unspecified: Secondary | ICD-10-CM

## 2015-02-27 DIAGNOSIS — R0602 Shortness of breath: Secondary | ICD-10-CM | POA: Diagnosis not present

## 2015-02-27 DIAGNOSIS — J449 Chronic obstructive pulmonary disease, unspecified: Secondary | ICD-10-CM | POA: Insufficient documentation

## 2015-02-27 DIAGNOSIS — Z87891 Personal history of nicotine dependence: Secondary | ICD-10-CM | POA: Insufficient documentation

## 2015-04-04 ENCOUNTER — Encounter: Payer: Self-pay | Admitting: General Surgery

## 2015-04-04 ENCOUNTER — Encounter: Payer: Self-pay | Admitting: Oncology

## 2015-04-07 ENCOUNTER — Ambulatory Visit
Admission: RE | Admit: 2015-04-07 | Discharge: 2015-04-07 | Disposition: A | Discharge status: Home / Self Care | Payer: BLUE CROSS/BLUE SHIELD | Source: Ambulatory Visit | Attending: Nurse Practitioner | Admitting: Nurse Practitioner

## 2015-04-07 ENCOUNTER — Other Ambulatory Visit: Payer: Self-pay | Admitting: Nurse Practitioner

## 2015-04-07 DIAGNOSIS — R05 Cough: Secondary | ICD-10-CM

## 2015-04-07 DIAGNOSIS — R079 Chest pain, unspecified: Secondary | ICD-10-CM | POA: Diagnosis not present

## 2015-04-07 DIAGNOSIS — R059 Cough, unspecified: Secondary | ICD-10-CM

## 2015-04-07 DIAGNOSIS — R918 Other nonspecific abnormal finding of lung field: Secondary | ICD-10-CM | POA: Insufficient documentation

## 2015-04-08 ENCOUNTER — Encounter: Payer: Self-pay | Admitting: General Surgery

## 2015-04-08 ENCOUNTER — Encounter: Payer: Self-pay | Admitting: *Deleted

## 2015-04-08 ENCOUNTER — Telehealth: Payer: Self-pay | Admitting: *Deleted

## 2015-04-08 NOTE — Telephone Encounter (Signed)
MyChart messages.

## 2015-04-08 NOTE — Telephone Encounter (Signed)
Thank you and I have entered those dates in your history profile. If you are able to obtain other immunization dates just let me know. I hope this clears up some of your alerts. Rosann Auerbach RN

## 2015-04-08 NOTE — Telephone Encounter (Signed)
Need to know which immunizations and the dates that he had them so I can try to log this information.

## 2015-04-29 ENCOUNTER — Other Ambulatory Visit: Payer: Self-pay | Admitting: Internal Medicine

## 2015-04-29 DIAGNOSIS — R059 Cough, unspecified: Secondary | ICD-10-CM

## 2015-04-29 DIAGNOSIS — R05 Cough: Secondary | ICD-10-CM

## 2015-04-30 ENCOUNTER — Other Ambulatory Visit: Payer: Self-pay | Admitting: Internal Medicine

## 2015-04-30 DIAGNOSIS — R059 Cough, unspecified: Secondary | ICD-10-CM

## 2015-04-30 DIAGNOSIS — R05 Cough: Secondary | ICD-10-CM

## 2015-05-06 ENCOUNTER — Ambulatory Visit
Admission: RE | Admit: 2015-05-06 | Discharge: 2015-05-06 | Disposition: A | Discharge status: Home / Self Care | Payer: BLUE CROSS/BLUE SHIELD | Source: Ambulatory Visit | Attending: Internal Medicine | Admitting: Internal Medicine

## 2015-05-06 DIAGNOSIS — R059 Cough, unspecified: Secondary | ICD-10-CM

## 2015-05-06 DIAGNOSIS — R05 Cough: Secondary | ICD-10-CM | POA: Diagnosis not present

## 2015-05-06 MED ORDER — IOHEXOL 350 MG/ML SOLN
75.0000 mL | Freq: Once | INTRAVENOUS | Status: AC | PRN
  Administered 2015-05-06: 75 mL via INTRAVENOUS

## 2015-05-07 ENCOUNTER — Ambulatory Visit: Payer: BLUE CROSS/BLUE SHIELD

## 2015-05-14 ENCOUNTER — Ambulatory Visit
Admission: RE | Admit: 2015-05-14 | Discharge: 2015-05-14 | Disposition: A | Discharge status: Home / Self Care | Payer: BLUE CROSS/BLUE SHIELD | Source: Ambulatory Visit | Attending: Internal Medicine | Admitting: Internal Medicine

## 2015-05-14 DIAGNOSIS — K219 Gastro-esophageal reflux disease without esophagitis: Secondary | ICD-10-CM | POA: Insufficient documentation

## 2015-05-14 DIAGNOSIS — R059 Cough, unspecified: Secondary | ICD-10-CM

## 2015-05-14 DIAGNOSIS — K449 Diaphragmatic hernia without obstruction or gangrene: Secondary | ICD-10-CM | POA: Diagnosis not present

## 2015-05-14 DIAGNOSIS — R05 Cough: Secondary | ICD-10-CM | POA: Diagnosis not present

## 2015-05-27 DIAGNOSIS — R41840 Attention and concentration deficit: Secondary | ICD-10-CM | POA: Diagnosis not present

## 2015-05-27 DIAGNOSIS — R0602 Shortness of breath: Secondary | ICD-10-CM | POA: Diagnosis not present

## 2015-05-27 DIAGNOSIS — R05 Cough: Secondary | ICD-10-CM | POA: Diagnosis not present

## 2015-05-27 DIAGNOSIS — K219 Gastro-esophageal reflux disease without esophagitis: Secondary | ICD-10-CM | POA: Diagnosis not present

## 2015-08-12 DIAGNOSIS — G894 Chronic pain syndrome: Secondary | ICD-10-CM | POA: Diagnosis not present

## 2015-08-12 DIAGNOSIS — M5417 Radiculopathy, lumbosacral region: Secondary | ICD-10-CM | POA: Diagnosis not present

## 2015-08-12 DIAGNOSIS — M961 Postlaminectomy syndrome, not elsewhere classified: Secondary | ICD-10-CM | POA: Diagnosis not present

## 2015-08-12 DIAGNOSIS — Z79891 Long term (current) use of opiate analgesic: Secondary | ICD-10-CM | POA: Diagnosis not present

## 2015-09-01 DIAGNOSIS — J301 Allergic rhinitis due to pollen: Secondary | ICD-10-CM | POA: Diagnosis not present

## 2015-09-01 DIAGNOSIS — M25511 Pain in right shoulder: Secondary | ICD-10-CM | POA: Diagnosis not present

## 2015-09-01 DIAGNOSIS — J449 Chronic obstructive pulmonary disease, unspecified: Secondary | ICD-10-CM | POA: Diagnosis not present

## 2015-09-02 ENCOUNTER — Other Ambulatory Visit: Payer: Self-pay | Admitting: Physician Assistant

## 2015-09-02 DIAGNOSIS — M25511 Pain in right shoulder: Secondary | ICD-10-CM

## 2015-09-11 ENCOUNTER — Ambulatory Visit: Payer: BLUE CROSS/BLUE SHIELD

## 2015-09-20 DIAGNOSIS — G8911 Acute pain due to trauma: Secondary | ICD-10-CM | POA: Diagnosis not present

## 2015-09-20 DIAGNOSIS — M19011 Primary osteoarthritis, right shoulder: Secondary | ICD-10-CM | POA: Diagnosis not present

## 2015-09-20 DIAGNOSIS — M25511 Pain in right shoulder: Secondary | ICD-10-CM | POA: Diagnosis not present

## 2015-09-20 DIAGNOSIS — S46001A Unspecified injury of muscle(s) and tendon(s) of the rotator cuff of right shoulder, initial encounter: Secondary | ICD-10-CM | POA: Diagnosis not present

## 2015-09-22 DIAGNOSIS — I10 Essential (primary) hypertension: Secondary | ICD-10-CM | POA: Diagnosis not present

## 2015-09-22 DIAGNOSIS — M25511 Pain in right shoulder: Secondary | ICD-10-CM | POA: Diagnosis not present

## 2015-09-22 DIAGNOSIS — J449 Chronic obstructive pulmonary disease, unspecified: Secondary | ICD-10-CM | POA: Diagnosis not present

## 2015-09-22 DIAGNOSIS — E039 Hypothyroidism, unspecified: Secondary | ICD-10-CM | POA: Diagnosis not present

## 2015-10-02 DIAGNOSIS — M25511 Pain in right shoulder: Secondary | ICD-10-CM | POA: Diagnosis not present

## 2015-10-02 DIAGNOSIS — M19011 Primary osteoarthritis, right shoulder: Secondary | ICD-10-CM | POA: Diagnosis not present

## 2015-10-02 DIAGNOSIS — M7581 Other shoulder lesions, right shoulder: Secondary | ICD-10-CM | POA: Insufficient documentation

## 2015-10-02 DIAGNOSIS — M542 Cervicalgia: Secondary | ICD-10-CM | POA: Diagnosis not present

## 2015-10-08 DIAGNOSIS — M961 Postlaminectomy syndrome, not elsewhere classified: Secondary | ICD-10-CM | POA: Diagnosis not present

## 2015-10-08 DIAGNOSIS — Z79891 Long term (current) use of opiate analgesic: Secondary | ICD-10-CM | POA: Diagnosis not present

## 2015-10-08 DIAGNOSIS — M5417 Radiculopathy, lumbosacral region: Secondary | ICD-10-CM | POA: Diagnosis not present

## 2015-10-08 DIAGNOSIS — G894 Chronic pain syndrome: Secondary | ICD-10-CM | POA: Diagnosis not present

## 2015-10-14 DIAGNOSIS — M542 Cervicalgia: Secondary | ICD-10-CM | POA: Diagnosis not present

## 2015-10-14 DIAGNOSIS — M25511 Pain in right shoulder: Secondary | ICD-10-CM | POA: Diagnosis not present

## 2015-10-16 DIAGNOSIS — D509 Iron deficiency anemia, unspecified: Secondary | ICD-10-CM | POA: Diagnosis not present

## 2015-10-16 DIAGNOSIS — E039 Hypothyroidism, unspecified: Secondary | ICD-10-CM | POA: Diagnosis not present

## 2015-10-16 DIAGNOSIS — M25511 Pain in right shoulder: Secondary | ICD-10-CM | POA: Diagnosis not present

## 2015-10-21 DIAGNOSIS — M25511 Pain in right shoulder: Secondary | ICD-10-CM | POA: Diagnosis not present

## 2015-10-21 DIAGNOSIS — M542 Cervicalgia: Secondary | ICD-10-CM | POA: Diagnosis not present

## 2015-10-21 DIAGNOSIS — Z23 Encounter for immunization: Secondary | ICD-10-CM | POA: Diagnosis not present

## 2015-10-23 DIAGNOSIS — M25511 Pain in right shoulder: Secondary | ICD-10-CM | POA: Diagnosis not present

## 2015-10-23 DIAGNOSIS — M542 Cervicalgia: Secondary | ICD-10-CM | POA: Diagnosis not present

## 2015-10-28 DIAGNOSIS — M542 Cervicalgia: Secondary | ICD-10-CM | POA: Diagnosis not present

## 2015-10-28 DIAGNOSIS — M25511 Pain in right shoulder: Secondary | ICD-10-CM | POA: Diagnosis not present

## 2015-10-30 DIAGNOSIS — M25511 Pain in right shoulder: Secondary | ICD-10-CM | POA: Diagnosis not present

## 2015-10-30 DIAGNOSIS — M542 Cervicalgia: Secondary | ICD-10-CM | POA: Diagnosis not present

## 2015-11-05 DIAGNOSIS — M25511 Pain in right shoulder: Secondary | ICD-10-CM | POA: Diagnosis not present

## 2015-11-05 DIAGNOSIS — M542 Cervicalgia: Secondary | ICD-10-CM | POA: Diagnosis not present

## 2015-11-07 DIAGNOSIS — M542 Cervicalgia: Secondary | ICD-10-CM | POA: Diagnosis not present

## 2015-11-07 DIAGNOSIS — M25511 Pain in right shoulder: Secondary | ICD-10-CM | POA: Diagnosis not present

## 2015-11-10 DIAGNOSIS — M542 Cervicalgia: Secondary | ICD-10-CM | POA: Diagnosis not present

## 2015-11-10 DIAGNOSIS — M25511 Pain in right shoulder: Secondary | ICD-10-CM | POA: Diagnosis not present

## 2015-11-13 DIAGNOSIS — M7581 Other shoulder lesions, right shoulder: Secondary | ICD-10-CM | POA: Diagnosis not present

## 2015-11-13 DIAGNOSIS — M542 Cervicalgia: Secondary | ICD-10-CM | POA: Diagnosis not present

## 2015-11-14 DIAGNOSIS — M542 Cervicalgia: Secondary | ICD-10-CM | POA: Diagnosis not present

## 2015-11-14 DIAGNOSIS — M25511 Pain in right shoulder: Secondary | ICD-10-CM | POA: Diagnosis not present

## 2015-11-17 DIAGNOSIS — M25511 Pain in right shoulder: Secondary | ICD-10-CM | POA: Diagnosis not present

## 2015-11-17 DIAGNOSIS — M542 Cervicalgia: Secondary | ICD-10-CM | POA: Diagnosis not present

## 2015-11-19 DIAGNOSIS — M542 Cervicalgia: Secondary | ICD-10-CM | POA: Diagnosis not present

## 2015-11-19 DIAGNOSIS — M25511 Pain in right shoulder: Secondary | ICD-10-CM | POA: Diagnosis not present

## 2015-11-24 DIAGNOSIS — M542 Cervicalgia: Secondary | ICD-10-CM | POA: Diagnosis not present

## 2015-11-24 DIAGNOSIS — M25511 Pain in right shoulder: Secondary | ICD-10-CM | POA: Diagnosis not present

## 2015-11-26 DIAGNOSIS — N411 Chronic prostatitis: Secondary | ICD-10-CM | POA: Diagnosis not present

## 2015-11-26 DIAGNOSIS — N138 Other obstructive and reflux uropathy: Secondary | ICD-10-CM | POA: Diagnosis not present

## 2015-11-26 DIAGNOSIS — R350 Frequency of micturition: Secondary | ICD-10-CM | POA: Diagnosis not present

## 2015-11-26 DIAGNOSIS — E291 Testicular hypofunction: Secondary | ICD-10-CM | POA: Diagnosis not present

## 2015-11-26 DIAGNOSIS — N401 Enlarged prostate with lower urinary tract symptoms: Secondary | ICD-10-CM | POA: Diagnosis not present

## 2015-12-01 DIAGNOSIS — E291 Testicular hypofunction: Secondary | ICD-10-CM | POA: Diagnosis not present

## 2015-12-01 DIAGNOSIS — N138 Other obstructive and reflux uropathy: Secondary | ICD-10-CM | POA: Diagnosis not present

## 2015-12-01 DIAGNOSIS — D509 Iron deficiency anemia, unspecified: Secondary | ICD-10-CM | POA: Diagnosis not present

## 2015-12-01 DIAGNOSIS — Z79899 Other long term (current) drug therapy: Secondary | ICD-10-CM | POA: Diagnosis not present

## 2015-12-01 DIAGNOSIS — N401 Enlarged prostate with lower urinary tract symptoms: Secondary | ICD-10-CM | POA: Diagnosis not present

## 2015-12-01 DIAGNOSIS — G039 Meningitis, unspecified: Secondary | ICD-10-CM | POA: Diagnosis not present

## 2016-01-06 DIAGNOSIS — R0602 Shortness of breath: Secondary | ICD-10-CM | POA: Diagnosis not present

## 2016-01-06 DIAGNOSIS — G479 Sleep disorder, unspecified: Secondary | ICD-10-CM | POA: Diagnosis not present

## 2016-01-06 DIAGNOSIS — J449 Chronic obstructive pulmonary disease, unspecified: Secondary | ICD-10-CM | POA: Diagnosis not present

## 2016-01-07 DIAGNOSIS — M961 Postlaminectomy syndrome, not elsewhere classified: Secondary | ICD-10-CM | POA: Diagnosis not present

## 2016-01-07 DIAGNOSIS — Z79891 Long term (current) use of opiate analgesic: Secondary | ICD-10-CM | POA: Diagnosis not present

## 2016-01-07 DIAGNOSIS — M5417 Radiculopathy, lumbosacral region: Secondary | ICD-10-CM | POA: Diagnosis not present

## 2016-01-07 DIAGNOSIS — G894 Chronic pain syndrome: Secondary | ICD-10-CM | POA: Diagnosis not present

## 2016-02-17 DIAGNOSIS — R41840 Attention and concentration deficit: Secondary | ICD-10-CM | POA: Diagnosis not present

## 2016-02-17 DIAGNOSIS — J0191 Acute recurrent sinusitis, unspecified: Secondary | ICD-10-CM | POA: Diagnosis not present

## 2016-02-17 DIAGNOSIS — E039 Hypothyroidism, unspecified: Secondary | ICD-10-CM | POA: Diagnosis not present

## 2016-02-17 DIAGNOSIS — J449 Chronic obstructive pulmonary disease, unspecified: Secondary | ICD-10-CM | POA: Diagnosis not present

## 2016-02-19 DIAGNOSIS — E039 Hypothyroidism, unspecified: Secondary | ICD-10-CM | POA: Diagnosis not present

## 2016-02-19 DIAGNOSIS — D509 Iron deficiency anemia, unspecified: Secondary | ICD-10-CM | POA: Diagnosis not present

## 2016-03-04 DIAGNOSIS — M5417 Radiculopathy, lumbosacral region: Secondary | ICD-10-CM | POA: Diagnosis not present

## 2016-03-04 DIAGNOSIS — G894 Chronic pain syndrome: Secondary | ICD-10-CM | POA: Diagnosis not present

## 2016-03-04 DIAGNOSIS — Z79891 Long term (current) use of opiate analgesic: Secondary | ICD-10-CM | POA: Diagnosis not present

## 2016-03-04 DIAGNOSIS — M961 Postlaminectomy syndrome, not elsewhere classified: Secondary | ICD-10-CM | POA: Diagnosis not present

## 2016-04-05 NOTE — Progress Notes (Signed)
[facility]  [phone]) [phone] [phone]) [phone]  ID: HOSEY HARPS Younger OB: 19-Aug-1960  MR#: [id number]  [id number]  Patient Care Team: Lavera Guise, MD as PCP - General (Internal Medicine) Robert Bellow, MD (General Surgery) Lavera Guise, MD (Internal Medicine)  CHIEF COMPLAINT: Iron deficiency anemia.  INTERVAL HISTORY: Patient last evaluated in clinic in August 2016. He is referred back with declining hemoglobin and iron stores. He also has noticed increased weakness and fatigue. He otherwise feels well and remains asymptomatic. He has no neurologic complaints.  He denies any recent fevers.  He denies any chest pain or shortness of breath.  He has a good appetite and denies weight loss.  He denies any nausea, vomiting, constipation, or diarrhea.  He has no melena or hematochezia.  He has no urinary complaints.  Patient offers no specific complaints today.  REVIEW OF SYSTEMS:   Review of Systems  Constitutional: Positive for malaise/fatigue. Negative for fever and weight loss.  Respiratory: Negative.  Negative for cough and shortness of breath.   Cardiovascular: Negative.  Negative for chest pain and leg swelling.  Gastrointestinal: Negative.  Negative for abdominal pain, blood in stool and melena.  Musculoskeletal: Negative.   Neurological: Positive for weakness. Negative for sensory change.  Psychiatric/Behavioral: Negative.  The patient is not nervous/anxious.     As per HPI. Otherwise, a complete review of systems is negative.  PAST MEDICAL HISTORY: Past Medical History:  Diagnosis Date  . Allergy    takes allergy shots  . Anemia   . Arthritis   . Asthma   . Barrett esophagus   . Bronchitis   . Chronic pain   . Colon polyp   . Depression   . GERD (gastroesophageal reflux disease)   . Hemorrhoid   . Hyperlipidemia   . Hypertension   . Hypothyroid   . IBS (irritable bowel syndrome)   . Kidney stone   . MI (myocardial  infarction)   . Migraine   . Pneumonia   . Seizures ([place])   . Sinus problem   . Spinal cord stimulator status     PAST SURGICAL HISTORY: Past Surgical History:  Procedure Laterality Date  . BACK SURGERY    . COLONOSCOPY  August 2015  . elbow surgery    . HEMORRHOID SURGERY    . LIPOMA EXCISION Right    leg  . 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 SPINAL CORD STIMULATOR IMPLANT    . UPPER GI ENDOSCOPY  August 2015    FAMILY HISTORY Family History  Problem Relation Age of Onset  . Hypertension Mother   . Heart attack Father   . Diabetes Father   . Lung disease Mother        ADVANCED DIRECTIVES:    HEALTH MAINTENANCE: Social History  Substance Use Topics  . Smoking status: Former Smoker    Packs/day: 1.00    Years: 30.00    Types: Cigarettes    Quit date: 02/22/2005  . Smokeless tobacco: Never Used  . Alcohol use 0.0 oz/week     Colonoscopy:  PAP:  Bone density:  Lipid panel:  Allergies  Allergen Reactions  . Amoxicillin Anaphylaxis    REACTION: unspecified  . Penicillin G Anaphylaxis  . Gabapentin Other (See Comments)    REACTION: anaphylaxis  . Penicillins     REACTION: unspecified  . Adhesive [Tape] Rash    Band-aid    Current Outpatient Prescriptions  Medication Sig Dispense  Refill  . albuterol (PROVENTIL HFA;VENTOLIN HFA) 108 (90 Base) MCG/ACT inhaler Frequency:PHARMDIR   Dosage:90   MCG  Instructions:  Note:Dose: 19 MCG    . alfuzosin (UROXATRAL) 10 MG 24 hr tablet Take 10 mg by mouth daily.    Marland Kitchen ALPRAZolam (XANAX) 1 MG tablet 4 (four) times daily. As needed    . amphetamine-dextroamphetamine (ADDERALL) 30 MG tablet TK 1 T PO BID PRN FOR FOCUS  0  . atorvastatin (LIPITOR) 10 MG tablet Take 1 tablet by mouth daily.    Marland Kitchen azelastine (ASTELIN) 137 MCG/SPRAY nasal spray Place 1-2 sprays into both nostrils as needed.    Marland Kitchen buPROPion (WELLBUTRIN XL) 300 MG 24 hr tablet take 1 tablet by mouth once daily 30 tablet 2  . citalopram (CELEXA) 20  MG tablet Take 20 mg by mouth daily.     Marland Kitchen esomeprazole (NEXIUM) 40 MG capsule TK ONE C PO  BID UTD  0  . Fe Cbn-Fe Gluc-FA-B12-C-DSS (FERRALET 90 PO) Take 1 tablet by mouth daily.    . Levothyroxine Sodium (TIROSINT) 150 MCG CAPS Take 1 capsule by mouth daily before breakfast.    . losartan (COZAAR) 50 MG tablet Take 50 mg by mouth daily.    . meloxicam (MOBIC) 7.5 MG tablet Take 7.5 mg by mouth daily.    . metoprolol succinate (TOPROL-XL) 25 MG 24 hr tablet Take 25 mg by mouth daily.    . mometasone-formoterol (DULERA) 100-5 MCG/ACT AERO Inhale 2 puffs into the lungs every 12 (twelve) hours.    . Multiple Vitamin (MULTI-VITAMINS) TABS Take by mouth.    Gean Birchwood ER 200 MG TB12 200 mg bid  0  . oxyCODONE-acetaminophen (PERCOCET) 7.5-325 MG per tablet Take 1 tablet by mouth every 4 (four) hours as needed for pain.    . pregabalin (LYRICA) 50 MG capsule Take 100 mg by mouth 3 (three) times daily.     Marland Kitchen senna (SENOKOT) 8.6 MG tablet Take by mouth.    . SYRINGE-NEEDLE, DISP, 3 ML (MONOJECT 3CC SYR 22GX1") 22G X 1" 3 ML MISC Use as directed.    . testosterone cypionate (DEPOTESTOTERONE CYPIONATE) 100 MG/ML injection Inject 200 mg into the muscle every 28 (twenty-eight) days. For IM use only     . TIROSINT 50 MCG CAPS   1  . diphenhydrAMINE (SOMINEX) 25 MG tablet Take 25 mg by mouth as needed for sleep.    Marland Kitchen l-methylfolate-B6-B12 (METANX) 3-35-2 MG TABS Take 1 tablet by mouth 2 (two) times daily.     No current facility-administered medications for this visit.     OBJECTIVE: Vitals:   04/06/16 1144  BP: (!) 147/73  Pulse: 79  Resp: 18  Temp: 98.1 F (36.7 C)     Body mass index is 30.42 kg/m.    ECOG FS:0 - Asymptomatic  General: Well-developed, well-nourished, no acute distress. Eyes: Pink conjunctiva, anicteric sclera. Lungs: Clear to auscultation bilaterally. Heart: Regular rate and rhythm. No rubs, murmurs, or gallops. Abdomen: Soft, nontender, nondistended. No organomegaly noted,  normoactive bowel sounds. Musculoskeletal: No edema, cyanosis, or clubbing. Neuro: Alert, answering all questions appropriately. Cranial nerves grossly intact. Skin: No rashes or petechiae noted. Psych: Normal affect.   LAB RESULTS:  Lab Results  Component Value Date   NA 142 04/17/2009   K 3.7 04/17/2009   CL 109 04/17/2009   CO2 30 04/17/2009   GLUCOSE 95 04/17/2009   BUN 6 04/17/2009   CREATININE 1.2 04/17/2009   CALCIUM 8.8 04/17/2009  PROT 7.0 04/17/2009   ALBUMIN 3.9 04/17/2009   AST 20 04/17/2009   ALT 20 04/17/2009   ALKPHOS 54 04/17/2009   BILITOT 0.0 (L) 04/17/2009   GFRNONAA 69.21 04/17/2009   GFRAA 94 10/31/2006    Lab Results  Component Value Date   WBC 6.3 09/25/2014   NEUTROABS 3.1 09/25/2014   HGB 14.2 09/25/2014   HCT 43.2 09/25/2014   MCV 84.4 09/25/2014   PLT 214 09/25/2014     STUDIES: No results found.  ASSESSMENT: Iron deficiency anemia.  PLAN:    1.  Iron deficiency anemia: Patient's hemoglobin has trended down and he is symptomatic. His iron stores are also decreased with a total iron of 22 and iron saturation of 7%. His ferritin is decreased at 23. B-12 and folate are within normal limits. Return to clinic in 1 and 2 weeks for 510 mg IV Feraheme. Patient will then return to clinic in 4 months with repeat laboratory work and further evaluation.   2. Family history of colon cancer:  Patient gets routine colonoscopies given his family history of colon cancer, all of which been unrevealing.  His genetic testing is negative.  Patient expressed understanding and was in agreement with this plan. He also understands that He can call clinic at any time with any questions, concerns, or complaints.    Lloyd Huger, MD   04/08/2016 6:37 PM

## 2016-04-06 ENCOUNTER — Inpatient Hospital Stay: Payer: BLUE CROSS/BLUE SHIELD | Attending: Oncology | Admitting: Oncology

## 2016-04-06 VITALS — BP 147/73 | HR 79 | Temp 98.1°F | Resp 18 | Wt 224.3 lb

## 2016-04-06 DIAGNOSIS — Z800 Family history of malignant neoplasm of digestive organs: Secondary | ICD-10-CM | POA: Diagnosis not present

## 2016-04-06 DIAGNOSIS — I10 Essential (primary) hypertension: Secondary | ICD-10-CM | POA: Diagnosis not present

## 2016-04-06 DIAGNOSIS — D509 Iron deficiency anemia, unspecified: Secondary | ICD-10-CM | POA: Diagnosis not present

## 2016-04-06 DIAGNOSIS — E039 Hypothyroidism, unspecified: Secondary | ICD-10-CM | POA: Insufficient documentation

## 2016-04-06 DIAGNOSIS — J45909 Unspecified asthma, uncomplicated: Secondary | ICD-10-CM | POA: Insufficient documentation

## 2016-04-06 DIAGNOSIS — Z87891 Personal history of nicotine dependence: Secondary | ICD-10-CM | POA: Insufficient documentation

## 2016-04-06 DIAGNOSIS — E785 Hyperlipidemia, unspecified: Secondary | ICD-10-CM | POA: Diagnosis not present

## 2016-04-06 DIAGNOSIS — Z86010 Personal history of colonic polyps: Secondary | ICD-10-CM | POA: Insufficient documentation

## 2016-04-06 DIAGNOSIS — I252 Old myocardial infarction: Secondary | ICD-10-CM | POA: Diagnosis not present

## 2016-04-06 DIAGNOSIS — Z87442 Personal history of urinary calculi: Secondary | ICD-10-CM | POA: Insufficient documentation

## 2016-04-06 DIAGNOSIS — K589 Irritable bowel syndrome without diarrhea: Secondary | ICD-10-CM | POA: Insufficient documentation

## 2016-04-06 DIAGNOSIS — F329 Major depressive disorder, single episode, unspecified: Secondary | ICD-10-CM | POA: Diagnosis not present

## 2016-04-06 DIAGNOSIS — K219 Gastro-esophageal reflux disease without esophagitis: Secondary | ICD-10-CM | POA: Insufficient documentation

## 2016-04-06 DIAGNOSIS — D508 Other iron deficiency anemias: Secondary | ICD-10-CM

## 2016-04-06 DIAGNOSIS — Z79899 Other long term (current) drug therapy: Secondary | ICD-10-CM | POA: Insufficient documentation

## 2016-04-06 NOTE — Progress Notes (Signed)
Here for follow up. Pt c/o of always feeling tired.

## 2016-04-15 ENCOUNTER — Inpatient Hospital Stay: Payer: BLUE CROSS/BLUE SHIELD

## 2016-04-15 VITALS — BP 106/64 | HR 60 | Temp 97.1°F | Resp 18

## 2016-04-15 DIAGNOSIS — Z86010 Personal history of colonic polyps: Secondary | ICD-10-CM | POA: Diagnosis not present

## 2016-04-15 DIAGNOSIS — Z87442 Personal history of urinary calculi: Secondary | ICD-10-CM | POA: Diagnosis not present

## 2016-04-15 DIAGNOSIS — I10 Essential (primary) hypertension: Secondary | ICD-10-CM | POA: Diagnosis not present

## 2016-04-15 DIAGNOSIS — K589 Irritable bowel syndrome without diarrhea: Secondary | ICD-10-CM | POA: Diagnosis not present

## 2016-04-15 DIAGNOSIS — F329 Major depressive disorder, single episode, unspecified: Secondary | ICD-10-CM | POA: Diagnosis not present

## 2016-04-15 DIAGNOSIS — Z87891 Personal history of nicotine dependence: Secondary | ICD-10-CM | POA: Diagnosis not present

## 2016-04-15 DIAGNOSIS — K219 Gastro-esophageal reflux disease without esophagitis: Secondary | ICD-10-CM | POA: Diagnosis not present

## 2016-04-15 DIAGNOSIS — D509 Iron deficiency anemia, unspecified: Secondary | ICD-10-CM | POA: Diagnosis not present

## 2016-04-15 DIAGNOSIS — Z800 Family history of malignant neoplasm of digestive organs: Secondary | ICD-10-CM | POA: Diagnosis not present

## 2016-04-15 DIAGNOSIS — Z79899 Other long term (current) drug therapy: Secondary | ICD-10-CM | POA: Diagnosis not present

## 2016-04-15 DIAGNOSIS — E785 Hyperlipidemia, unspecified: Secondary | ICD-10-CM | POA: Diagnosis not present

## 2016-04-15 DIAGNOSIS — E039 Hypothyroidism, unspecified: Secondary | ICD-10-CM | POA: Diagnosis not present

## 2016-04-15 DIAGNOSIS — I252 Old myocardial infarction: Secondary | ICD-10-CM | POA: Diagnosis not present

## 2016-04-15 DIAGNOSIS — D508 Other iron deficiency anemias: Secondary | ICD-10-CM

## 2016-04-15 DIAGNOSIS — J45909 Unspecified asthma, uncomplicated: Secondary | ICD-10-CM | POA: Diagnosis not present

## 2016-04-15 MED ORDER — SODIUM CHLORIDE 0.9 % IV SOLN
Freq: Once | INTRAVENOUS | Status: AC
  Administered 2016-04-15: 14:00:00 via INTRAVENOUS
  Filled 2016-04-15: qty 1000

## 2016-04-15 MED ORDER — SODIUM CHLORIDE 0.9 % IV SOLN
510.0000 mg | Freq: Once | INTRAVENOUS | Status: AC
  Administered 2016-04-15: 510 mg via INTRAVENOUS
  Filled 2016-04-15: qty 17

## 2016-04-22 ENCOUNTER — Inpatient Hospital Stay: Payer: BLUE CROSS/BLUE SHIELD | Attending: Oncology

## 2016-04-22 VITALS — BP 111/69 | HR 63 | Temp 97.1°F | Resp 18

## 2016-04-22 DIAGNOSIS — D509 Iron deficiency anemia, unspecified: Secondary | ICD-10-CM | POA: Insufficient documentation

## 2016-04-22 DIAGNOSIS — Z79899 Other long term (current) drug therapy: Secondary | ICD-10-CM | POA: Diagnosis not present

## 2016-04-22 DIAGNOSIS — D508 Other iron deficiency anemias: Secondary | ICD-10-CM

## 2016-04-22 MED ORDER — SODIUM CHLORIDE 0.9 % IV SOLN
510.0000 mg | Freq: Once | INTRAVENOUS | Status: AC
  Administered 2016-04-22: 510 mg via INTRAVENOUS
  Filled 2016-04-22: qty 17

## 2016-04-22 MED ORDER — SODIUM CHLORIDE 0.9 % IV SOLN
Freq: Once | INTRAVENOUS | Status: AC
  Administered 2016-04-22: 14:00:00 via INTRAVENOUS
  Filled 2016-04-22: qty 1000

## 2016-05-26 DIAGNOSIS — Z79899 Other long term (current) drug therapy: Secondary | ICD-10-CM | POA: Diagnosis not present

## 2016-05-26 DIAGNOSIS — N138 Other obstructive and reflux uropathy: Secondary | ICD-10-CM | POA: Diagnosis not present

## 2016-05-26 DIAGNOSIS — N411 Chronic prostatitis: Secondary | ICD-10-CM | POA: Diagnosis not present

## 2016-05-26 DIAGNOSIS — E291 Testicular hypofunction: Secondary | ICD-10-CM | POA: Diagnosis not present

## 2016-05-26 DIAGNOSIS — N401 Enlarged prostate with lower urinary tract symptoms: Secondary | ICD-10-CM | POA: Diagnosis not present

## 2016-06-21 DIAGNOSIS — E039 Hypothyroidism, unspecified: Secondary | ICD-10-CM | POA: Diagnosis not present

## 2016-06-21 DIAGNOSIS — I10 Essential (primary) hypertension: Secondary | ICD-10-CM | POA: Diagnosis not present

## 2016-06-21 DIAGNOSIS — D509 Iron deficiency anemia, unspecified: Secondary | ICD-10-CM | POA: Diagnosis not present

## 2016-06-21 DIAGNOSIS — G4710 Hypersomnia, unspecified: Secondary | ICD-10-CM | POA: Diagnosis not present

## 2016-06-22 DIAGNOSIS — D509 Iron deficiency anemia, unspecified: Secondary | ICD-10-CM | POA: Diagnosis not present

## 2016-06-22 DIAGNOSIS — E039 Hypothyroidism, unspecified: Secondary | ICD-10-CM | POA: Diagnosis not present

## 2016-06-22 DIAGNOSIS — R7301 Impaired fasting glucose: Secondary | ICD-10-CM | POA: Diagnosis not present

## 2016-06-22 DIAGNOSIS — R42 Dizziness and giddiness: Secondary | ICD-10-CM | POA: Diagnosis not present

## 2016-07-01 DIAGNOSIS — G894 Chronic pain syndrome: Secondary | ICD-10-CM | POA: Diagnosis not present

## 2016-07-01 DIAGNOSIS — Z79891 Long term (current) use of opiate analgesic: Secondary | ICD-10-CM | POA: Diagnosis not present

## 2016-07-01 DIAGNOSIS — M5417 Radiculopathy, lumbosacral region: Secondary | ICD-10-CM | POA: Diagnosis not present

## 2016-07-01 DIAGNOSIS — M961 Postlaminectomy syndrome, not elsewhere classified: Secondary | ICD-10-CM | POA: Diagnosis not present

## 2016-07-26 DIAGNOSIS — R200 Anesthesia of skin: Secondary | ICD-10-CM | POA: Diagnosis not present

## 2016-08-04 ENCOUNTER — Ambulatory Visit: Payer: BLUE CROSS/BLUE SHIELD | Admitting: Oncology

## 2016-08-04 ENCOUNTER — Other Ambulatory Visit: Payer: BLUE CROSS/BLUE SHIELD

## 2016-08-04 ENCOUNTER — Ambulatory Visit: Payer: BLUE CROSS/BLUE SHIELD

## 2016-08-17 NOTE — Progress Notes (Signed)
[place]  [phone]) [phone] [phone]) [phone]  ID: Randy Mclean Younger OB: 09-03-1960  MR#: [id number]  HBZ#:[id number]  Patient Care Team: Lavera Guise, MD as PCP - General (Internal Medicine) Bary Castilla Forest Gleason, MD (General Surgery) Lavera Guise, MD (Internal Medicine)  CHIEF COMPLAINT: Iron deficiency anemia.  INTERVAL HISTORY: Patient returns to clinic today for further evaluation and laboratory work. He currently feels well and is asymptomatic. He does not complain of weakness and fatigue today. He has no neurologic complaints.  He denies any recent fevers.  He denies any chest pain or shortness of breath.  He has a good appetite and denies weight loss.  He denies any nausea, vomiting, constipation, or diarrhea.  He has no melena or hematochezia.  He has no urinary complaints.  Patient offers no specific complaints today.  REVIEW OF SYSTEMS:   Review of Systems  Constitutional: Negative for fever, malaise/fatigue and weight loss.  Respiratory: Negative.  Negative for cough and shortness of breath.   Cardiovascular: Negative.  Negative for chest pain and leg swelling.  Gastrointestinal: Negative.  Negative for abdominal pain, blood in stool and melena.  Musculoskeletal: Negative.   Skin: Negative.  Negative for rash.  Neurological: Negative for sensory change and weakness.  Psychiatric/Behavioral: Negative.  The patient is not nervous/anxious.     As per HPI. Otherwise, a complete review of systems is negative.  PAST MEDICAL HISTORY: Past Medical History:  Diagnosis Date  . Allergy    takes allergy shots  . Anemia   . Arthritis   . Asthma   . Barrett esophagus   . Bronchitis   . Chronic pain   . Colon polyp   . Depression   . GERD (gastroesophageal reflux disease)   . Hemorrhoid   . Hyperlipidemia   . Hypertension   . Hypothyroid   . IBS (irritable bowel syndrome)   . Kidney stone   . MI (myocardial infarction)   . Migraine   .  Pneumonia   . Seizures ([place])   . Sinus problem   . Spinal cord stimulator status     PAST SURGICAL HISTORY: Past Surgical History:  Procedure Laterality Date  . BACK SURGERY    . COLONOSCOPY  August 2015  . elbow surgery    . HEMORRHOID SURGERY    . LIPOMA EXCISION Right    leg  . 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 SPINAL CORD STIMULATOR IMPLANT    . UPPER GI ENDOSCOPY  August 2015    FAMILY HISTORY Family History  Problem Relation Age of Onset  . Hypertension Mother   . Heart attack Father   . Diabetes Father   . Lung disease Mother        ADVANCED DIRECTIVES:    HEALTH MAINTENANCE: Social History  Substance Use Topics  . Smoking status: Former Smoker    Packs/day: 1.00    Years: 30.00    Types: Cigarettes    Quit date: 02/22/2005  . Smokeless tobacco: Never Used  . Alcohol use 0.0 oz/week     Colonoscopy:  PAP:  Bone density:  Lipid panel:  Allergies  Allergen Reactions  . Amoxicillin Anaphylaxis    REACTION: unspecified  . Penicillin G Anaphylaxis  . Gabapentin Other (See Comments)    REACTION: anaphylaxis  . Penicillins     REACTION: unspecified  . Adhesive [Tape] Rash    Band-aid    Current Outpatient Prescriptions  Medication Sig Dispense Refill  .  albuterol (PROVENTIL HFA;VENTOLIN HFA) 108 (90 Base) MCG/ACT inhaler Frequency:PHARMDIR   Dosage:90   MCG  Instructions:  Note:Dose: 59 MCG    . alfuzosin (UROXATRAL) 10 MG 24 hr tablet Take 10 mg by mouth daily.    Marland Kitchen ALPRAZolam (XANAX) 1 MG tablet 4 (four) times daily. As needed    . amphetamine-dextroamphetamine (ADDERALL) 30 MG tablet TK 1 T PO BID PRN FOR FOCUS  0  . atorvastatin (LIPITOR) 10 MG tablet Take 1 tablet by mouth daily.    Marland Kitchen azelastine (ASTELIN) 137 MCG/SPRAY nasal spray Place 1-2 sprays into both nostrils as needed.    Marland Kitchen buPROPion (WELLBUTRIN XL) 300 MG 24 hr tablet take 1 tablet by mouth once daily 30 tablet 2  . citalopram (CELEXA) 20 MG tablet Take 20 mg by mouth  daily.     . diphenhydrAMINE (SOMINEX) 25 MG tablet Take 25 mg by mouth as needed for sleep.    Marland Kitchen esomeprazole (NEXIUM) 40 MG capsule TK ONE C PO  BID UTD  0  . Fe Cbn-Fe Gluc-FA-B12-C-DSS (FERRALET 90 PO) Take 1 tablet by mouth daily.    Marland Kitchen l-methylfolate-B6-B12 (METANX) 3-35-2 MG TABS Take 1 tablet by mouth 2 (two) times daily.    . Levothyroxine Sodium (TIROSINT) 150 MCG CAPS Take 1 capsule by mouth daily before breakfast.    . losartan (COZAAR) 50 MG tablet Take 50 mg by mouth daily.    . meloxicam (MOBIC) 7.5 MG tablet Take 7.5 mg by mouth daily.    . metoprolol succinate (TOPROL-XL) 25 MG 24 hr tablet Take 25 mg by mouth daily.    . mometasone-formoterol (DULERA) 100-5 MCG/ACT AERO Inhale 2 puffs into the lungs every 12 (twelve) hours.    . Multiple Vitamin (MULTI-VITAMINS) TABS Take by mouth.    Gean Birchwood ER 200 MG TB12 200 mg bid  0  . oxyCODONE-acetaminophen (PERCOCET) 7.5-325 MG per tablet Take 1 tablet by mouth every 4 (four) hours as needed for pain.    . pregabalin (LYRICA) 50 MG capsule Take 100 mg by mouth 3 (three) times daily.     Marland Kitchen senna (SENOKOT) 8.6 MG tablet Take by mouth.    . SYRINGE-NEEDLE, DISP, 3 ML (MONOJECT 3CC SYR 22GX1") 22G X 1" 3 ML MISC Use as directed.    . testosterone cypionate (DEPOTESTOTERONE CYPIONATE) 100 MG/ML injection Inject 200 mg into the muscle every 28 (twenty-eight) days. For IM use only     . TIROSINT 50 MCG CAPS   1   No current facility-administered medications for this visit.     OBJECTIVE: Vitals:   08/18/16 1523  BP: 122/68  Pulse: 67  Resp: 20  Temp: (!) 96.8 F (36 C)     Body mass index is 29.48 kg/m.    ECOG FS:0 - Asymptomatic  General: Well-developed, well-nourished, no acute distress. Eyes: Pink conjunctiva, anicteric sclera. Lungs: Clear to auscultation bilaterally. Heart: Regular rate and rhythm. No rubs, murmurs, or gallops. Abdomen: Soft, nontender, nondistended. No organomegaly noted, normoactive bowel  sounds. Musculoskeletal: No edema, cyanosis, or clubbing. Neuro: Alert, answering all questions appropriately. Cranial nerves grossly intact. Skin: No rashes or petechiae noted. Psych: Normal affect.   LAB RESULTS:  Lab Results  Component Value Date   NA 142 04/17/2009   K 3.7 04/17/2009   CL 109 04/17/2009   CO2 30 04/17/2009   GLUCOSE 95 04/17/2009   BUN 6 04/17/2009   CREATININE 1.2 04/17/2009   CALCIUM 8.8 04/17/2009   PROT 7.0 04/17/2009  ALBUMIN 3.9 04/17/2009   AST 20 04/17/2009   ALT 20 04/17/2009   ALKPHOS 54 04/17/2009   BILITOT 0.0 (L) 04/17/2009   GFRNONAA 69.21 04/17/2009   GFRAA 94 10/31/2006    Lab Results  Component Value Date   WBC 6.5 08/18/2016   NEUTROABS 3.5 08/18/2016   HGB 13.3 08/18/2016   HCT 39.1 (L) 08/18/2016   MCV 85.1 08/18/2016   PLT 215 08/18/2016   Lab Results  Component Value Date   IRON 87 08/18/2016   TIBC 359 08/18/2016   IRONPCTSAT 24 08/18/2016   Lab Results  Component Value Date   FERRITIN 24 08/18/2016     STUDIES: No results found.  ASSESSMENT: Iron deficiency anemia.  PLAN:    1.  Iron deficiency anemia: Patient's hemoglobin and iron stores are within normal limits today. Previously, the remainder of his laboratory work was either negative or within normal limits. He does not require additional IV Feraheme today. He last received Feraheme on April 22, 2016. Patient will return to clinic in 4 months with repeat laboratory work and further evaluation.   2. Family history of colon cancer:  Patient gets routine colonoscopies given his family history of colon cancer, all of which been unrevealing.  His genetic testing is negative.  Approximately 20 minutes was spent in discussion of which greater than 50% was consultation.   Patient expressed understanding and was in agreement with this plan. He also understands that He can call clinic at any time with any questions, concerns, or complaints.    Lloyd Huger,  MD   08/22/2016 12:08 AM

## 2016-08-18 ENCOUNTER — Inpatient Hospital Stay: Payer: BLUE CROSS/BLUE SHIELD | Attending: Oncology

## 2016-08-18 ENCOUNTER — Inpatient Hospital Stay (HOSPITAL_BASED_OUTPATIENT_CLINIC_OR_DEPARTMENT_OTHER): Payer: BLUE CROSS/BLUE SHIELD | Admitting: Oncology

## 2016-08-18 VITALS — BP 122/68 | HR 67 | Temp 96.8°F | Resp 20 | Wt 217.4 lb

## 2016-08-18 DIAGNOSIS — J45909 Unspecified asthma, uncomplicated: Secondary | ICD-10-CM | POA: Insufficient documentation

## 2016-08-18 DIAGNOSIS — J40 Bronchitis, not specified as acute or chronic: Secondary | ICD-10-CM

## 2016-08-18 DIAGNOSIS — K219 Gastro-esophageal reflux disease without esophagitis: Secondary | ICD-10-CM

## 2016-08-18 DIAGNOSIS — K589 Irritable bowel syndrome without diarrhea: Secondary | ICD-10-CM | POA: Diagnosis not present

## 2016-08-18 DIAGNOSIS — Z8701 Personal history of pneumonia (recurrent): Secondary | ICD-10-CM | POA: Diagnosis not present

## 2016-08-18 DIAGNOSIS — Z8669 Personal history of other diseases of the nervous system and sense organs: Secondary | ICD-10-CM | POA: Diagnosis not present

## 2016-08-18 DIAGNOSIS — I10 Essential (primary) hypertension: Secondary | ICD-10-CM | POA: Diagnosis not present

## 2016-08-18 DIAGNOSIS — E039 Hypothyroidism, unspecified: Secondary | ICD-10-CM

## 2016-08-18 DIAGNOSIS — Z87891 Personal history of nicotine dependence: Secondary | ICD-10-CM | POA: Insufficient documentation

## 2016-08-18 DIAGNOSIS — G8929 Other chronic pain: Secondary | ICD-10-CM

## 2016-08-18 DIAGNOSIS — F329 Major depressive disorder, single episode, unspecified: Secondary | ICD-10-CM | POA: Insufficient documentation

## 2016-08-18 DIAGNOSIS — G40909 Epilepsy, unspecified, not intractable, without status epilepticus: Secondary | ICD-10-CM | POA: Diagnosis not present

## 2016-08-18 DIAGNOSIS — Z800 Family history of malignant neoplasm of digestive organs: Secondary | ICD-10-CM | POA: Diagnosis not present

## 2016-08-18 DIAGNOSIS — Z87442 Personal history of urinary calculi: Secondary | ICD-10-CM | POA: Insufficient documentation

## 2016-08-18 DIAGNOSIS — Z79899 Other long term (current) drug therapy: Secondary | ICD-10-CM

## 2016-08-18 DIAGNOSIS — K5900 Constipation, unspecified: Secondary | ICD-10-CM | POA: Diagnosis not present

## 2016-08-18 DIAGNOSIS — M129 Arthropathy, unspecified: Secondary | ICD-10-CM

## 2016-08-18 DIAGNOSIS — E785 Hyperlipidemia, unspecified: Secondary | ICD-10-CM | POA: Insufficient documentation

## 2016-08-18 DIAGNOSIS — D509 Iron deficiency anemia, unspecified: Secondary | ICD-10-CM | POA: Insufficient documentation

## 2016-08-18 DIAGNOSIS — Z86010 Personal history of colonic polyps: Secondary | ICD-10-CM | POA: Insufficient documentation

## 2016-08-18 DIAGNOSIS — D508 Other iron deficiency anemias: Secondary | ICD-10-CM

## 2016-08-18 DIAGNOSIS — I252 Old myocardial infarction: Secondary | ICD-10-CM

## 2016-08-18 LAB — CBC WITH DIFFERENTIAL/PLATELET
Basophils Absolute: 0.1 10*3/uL (ref 0–0.1)
Basophils Relative: 1 %
Eosinophils Absolute: 0.2 10*3/uL (ref 0–0.7)
Eosinophils Relative: 3 %
HCT: 39.1 % — ABNORMAL LOW (ref 40.0–52.0)
Hemoglobin: 13.3 g/dL (ref 13.0–18.0)
Lymphocytes Relative: 34 %
Lymphs Abs: 2.2 10*3/uL (ref 1.0–3.6)
MCH: 28.8 pg (ref 26.0–34.0)
MCHC: 33.9 g/dL (ref 32.0–36.0)
MCV: 85.1 fL (ref 80.0–100.0)
Monocytes Absolute: 0.6 10*3/uL (ref 0.2–1.0)
Monocytes Relative: 9 %
Neutro Abs: 3.5 10*3/uL (ref 1.4–6.5)
Neutrophils Relative %: 53 %
Platelets: 215 10*3/uL (ref 150–440)
RBC: 4.6 MIL/uL (ref 4.40–5.90)
RDW: 13.8 % (ref 11.5–14.5)
WBC: 6.5 10*3/uL (ref 3.8–10.6)

## 2016-08-18 LAB — IRON AND TIBC
Iron: 87 ug/dL (ref 45–182)
Saturation Ratios: 24 % (ref 17.9–39.5)
TIBC: 359 ug/dL (ref 250–450)
UIBC: 272 ug/dL

## 2016-08-18 LAB — FERRITIN: Ferritin: 24 ng/mL (ref 24–336)

## 2016-08-18 NOTE — Progress Notes (Signed)
Patient reports fatigue today.  

## 2016-08-19 ENCOUNTER — Inpatient Hospital Stay: Payer: BLUE CROSS/BLUE SHIELD

## 2016-09-01 DIAGNOSIS — R200 Anesthesia of skin: Secondary | ICD-10-CM | POA: Diagnosis not present

## 2016-09-01 DIAGNOSIS — G5602 Carpal tunnel syndrome, left upper limb: Secondary | ICD-10-CM | POA: Diagnosis not present

## 2016-09-09 DIAGNOSIS — R200 Anesthesia of skin: Secondary | ICD-10-CM | POA: Insufficient documentation

## 2016-09-20 DIAGNOSIS — J209 Acute bronchitis, unspecified: Secondary | ICD-10-CM | POA: Diagnosis not present

## 2016-09-20 DIAGNOSIS — G4730 Sleep apnea, unspecified: Secondary | ICD-10-CM | POA: Diagnosis not present

## 2016-09-20 DIAGNOSIS — J449 Chronic obstructive pulmonary disease, unspecified: Secondary | ICD-10-CM | POA: Diagnosis not present

## 2016-09-20 DIAGNOSIS — J301 Allergic rhinitis due to pollen: Secondary | ICD-10-CM | POA: Diagnosis not present

## 2016-09-30 DIAGNOSIS — M961 Postlaminectomy syndrome, not elsewhere classified: Secondary | ICD-10-CM | POA: Diagnosis not present

## 2016-09-30 DIAGNOSIS — G894 Chronic pain syndrome: Secondary | ICD-10-CM | POA: Diagnosis not present

## 2016-09-30 DIAGNOSIS — Z79891 Long term (current) use of opiate analgesic: Secondary | ICD-10-CM | POA: Diagnosis not present

## 2016-09-30 DIAGNOSIS — M5417 Radiculopathy, lumbosacral region: Secondary | ICD-10-CM | POA: Diagnosis not present

## 2016-11-25 DIAGNOSIS — Z79899 Other long term (current) drug therapy: Secondary | ICD-10-CM | POA: Diagnosis not present

## 2016-11-25 DIAGNOSIS — N411 Chronic prostatitis: Secondary | ICD-10-CM | POA: Diagnosis not present

## 2016-11-25 DIAGNOSIS — R499 Unspecified voice and resonance disorder: Secondary | ICD-10-CM | POA: Diagnosis not present

## 2016-11-25 DIAGNOSIS — N401 Enlarged prostate with lower urinary tract symptoms: Secondary | ICD-10-CM | POA: Diagnosis not present

## 2016-11-25 DIAGNOSIS — N138 Other obstructive and reflux uropathy: Secondary | ICD-10-CM | POA: Diagnosis not present

## 2016-11-25 DIAGNOSIS — R339 Retention of urine, unspecified: Secondary | ICD-10-CM | POA: Diagnosis not present

## 2016-11-25 DIAGNOSIS — E291 Testicular hypofunction: Secondary | ICD-10-CM | POA: Diagnosis not present

## 2016-12-02 DIAGNOSIS — M5417 Radiculopathy, lumbosacral region: Secondary | ICD-10-CM | POA: Diagnosis not present

## 2016-12-02 DIAGNOSIS — M961 Postlaminectomy syndrome, not elsewhere classified: Secondary | ICD-10-CM | POA: Diagnosis not present

## 2016-12-02 DIAGNOSIS — Z79891 Long term (current) use of opiate analgesic: Secondary | ICD-10-CM | POA: Diagnosis not present

## 2016-12-02 DIAGNOSIS — G894 Chronic pain syndrome: Secondary | ICD-10-CM | POA: Diagnosis not present

## 2016-12-15 ENCOUNTER — Ambulatory Visit: Payer: BLUE CROSS/BLUE SHIELD | Admitting: Oncology

## 2016-12-15 ENCOUNTER — Other Ambulatory Visit: Payer: BLUE CROSS/BLUE SHIELD

## 2016-12-16 ENCOUNTER — Ambulatory Visit: Payer: BLUE CROSS/BLUE SHIELD

## 2016-12-16 DIAGNOSIS — R42 Dizziness and giddiness: Secondary | ICD-10-CM | POA: Diagnosis not present

## 2016-12-16 DIAGNOSIS — R490 Dysphonia: Secondary | ICD-10-CM | POA: Diagnosis not present

## 2016-12-16 DIAGNOSIS — R0982 Postnasal drip: Secondary | ICD-10-CM | POA: Diagnosis not present

## 2016-12-20 DIAGNOSIS — E039 Hypothyroidism, unspecified: Secondary | ICD-10-CM | POA: Diagnosis not present

## 2016-12-20 DIAGNOSIS — I10 Essential (primary) hypertension: Secondary | ICD-10-CM | POA: Diagnosis not present

## 2016-12-20 DIAGNOSIS — K648 Other hemorrhoids: Secondary | ICD-10-CM | POA: Diagnosis not present

## 2016-12-20 DIAGNOSIS — Z23 Encounter for immunization: Secondary | ICD-10-CM | POA: Diagnosis not present

## 2016-12-20 DIAGNOSIS — Z79899 Other long term (current) drug therapy: Secondary | ICD-10-CM | POA: Diagnosis not present

## 2016-12-20 DIAGNOSIS — D509 Iron deficiency anemia, unspecified: Secondary | ICD-10-CM | POA: Diagnosis not present

## 2016-12-20 NOTE — Progress Notes (Signed)
[place]  [phone]) [phone] [phone]) [phone]  ID: Randy Mclean Younger OB: Jul 07, 1960  MR#: [id number]  WUX#:[id number]  Patient Care Team: Lavera Guise, MD as PCP - General (Internal Medicine) Bary Castilla Forest Gleason, MD (General Surgery) Lavera Guise, MD (Internal Medicine)  CHIEF COMPLAINT: Iron deficiency anemia.  INTERVAL HISTORY: Patient returns to clinic today for further evaluation and laboratory work. He continues to feel well and is asymptomatic.  He has occasional bright red blood per rectum which she believes is from an internal hemorrhoid. He does not complain of weakness and fatigue today. He has no neurologic complaints.  He denies any recent fevers.  He denies any chest pain or shortness of breath.  He has a good appetite and denies weight loss.  He denies any nausea, vomiting, constipation, or diarrhea.  He has no urinary complaints.  Patient offers no specific complaints today.  REVIEW OF SYSTEMS:   Review of Systems  Constitutional: Negative for fever, malaise/fatigue and weight loss.  Respiratory: Negative.  Negative for cough and shortness of breath.   Cardiovascular: Negative.  Negative for chest pain and leg swelling.  Gastrointestinal: Positive for blood in stool. Negative for abdominal pain and melena.  Genitourinary: Negative.  Negative for hematuria.  Musculoskeletal: Negative.   Skin: Negative.  Negative for rash.  Neurological: Negative for sensory change and weakness.  Psychiatric/Behavioral: Negative.  The patient is not nervous/anxious.     As per HPI. Otherwise, a complete review of systems is negative.  PAST MEDICAL HISTORY: Past Medical History:  Diagnosis Date  . Allergy    takes allergy shots  . Anemia   . Arthritis   . Asthma   . Barrett esophagus   . Bronchitis   . Chronic pain   . Colon polyp   . Depression   . GERD (gastroesophageal reflux disease)   . Hemorrhoid   . Hyperlipidemia   . Hypertension     . Hypothyroid   . IBS (irritable bowel syndrome)   . Kidney stone   . MI (myocardial infarction) ([place])   . Migraine   . Pneumonia   . Seizures ([place])   . Sinus problem   . Spinal cord stimulator status     PAST SURGICAL HISTORY: Past Surgical History:  Procedure Laterality Date  . BACK SURGERY    . COLONOSCOPY  August 2015  . elbow surgery    . HEMORRHOID SURGERY    . LIPOMA EXCISION Right    leg  . 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 SPINAL CORD STIMULATOR IMPLANT    . UPPER GI ENDOSCOPY  August 2015    FAMILY HISTORY Family History  Problem Relation Age of Onset  . Hypertension Mother   . Lung disease Mother   . Heart attack Father   . Diabetes Father        ADVANCED DIRECTIVES:    HEALTH MAINTENANCE: Social History  Substance Use Topics  . Smoking status: Former Smoker    Packs/day: 1.00    Years: 30.00    Types: Cigarettes    Quit date: 02/22/2005  . Smokeless tobacco: Never Used  . Alcohol use 0.0 oz/week     Colonoscopy:  PAP:  Bone density:  Lipid panel:  Allergies  Allergen Reactions  . Amoxicillin Anaphylaxis    REACTION: unspecified  . Penicillin G Anaphylaxis  . Gabapentin Other (See Comments)    REACTION: anaphylaxis  . Penicillins     REACTION: unspecified  .  Adhesive [Tape] Rash    Band-aid    Current Outpatient Prescriptions  Medication Sig Dispense Refill  . albuterol (PROVENTIL HFA;VENTOLIN HFA) 108 (90 Base) MCG/ACT inhaler Frequency:PHARMDIR   Dosage:90   MCG  Instructions:  Note:Dose: 20 MCG    . alfuzosin (UROXATRAL) 10 MG 24 hr tablet Take 10 mg by mouth daily.    Marland Kitchen ALPRAZolam (XANAX) 1 MG tablet 4 (four) times daily. As needed    . amphetamine-dextroamphetamine (ADDERALL) 30 MG tablet 1 Tablet 2 times a day, then 1/4 tablet around 6 pm  0  . atorvastatin (LIPITOR) 10 MG tablet Take 1 tablet by mouth daily.    Marland Kitchen azelastine (ASTELIN) 137 MCG/SPRAY nasal spray Place 1-2 sprays into both nostrils as needed.    Marland Kitchen  buPROPion (WELLBUTRIN XL) 300 MG 24 hr tablet take 1 tablet by mouth once daily 30 tablet 2  . citalopram (CELEXA) 20 MG tablet Take 20 mg by mouth daily.     . diphenhydrAMINE (SOMINEX) 25 MG tablet Take 25 mg by mouth as needed for sleep.    Marland Kitchen esomeprazole (NEXIUM) 40 MG capsule TK ONE C PO  BID UTD  0  . Fe Cbn-Fe Gluc-FA-B12-C-DSS (FERRALET 90 PO) Take 1 tablet by mouth daily.    Marland Kitchen l-methylfolate-B6-B12 (METANX) 3-35-2 MG TABS Take 1 tablet by mouth 2 (two) times daily.    . Levothyroxine Sodium (TIROSINT) 150 MCG CAPS Take 1 capsule by mouth daily before breakfast.    . losartan (COZAAR) 50 MG tablet Take 50 mg by mouth daily.    . meloxicam (MOBIC) 7.5 MG tablet Take 7.5 mg by mouth daily.    . metoprolol succinate (TOPROL-XL) 25 MG 24 hr tablet Take 25 mg by mouth daily.    . mometasone-formoterol (DULERA) 100-5 MCG/ACT AERO Inhale 2 puffs into the lungs every 12 (twelve) hours.    . Multiple Vitamin (MULTI-VITAMINS) TABS Take by mouth.    Gean Birchwood ER 200 MG TB12 200 mg bid  0  . oxyCODONE-acetaminophen (PERCOCET) 7.5-325 MG per tablet Take 1 tablet by mouth every 4 (four) hours as needed for pain.    . pregabalin (LYRICA) 50 MG capsule Take 100 mg by mouth 3 (three) times daily.     Marland Kitchen senna (SENOKOT) 8.6 MG tablet Take by mouth.    . SYRINGE-NEEDLE, DISP, 3 ML (MONOJECT 3CC SYR 22GX1") 22G X 1" 3 ML MISC Use as directed.    . testosterone cypionate (DEPOTESTOTERONE CYPIONATE) 100 MG/ML injection Inject 75 mg into the muscle every 7 (seven) days. For IM use only     . TIROSINT 50 MCG CAPS   1   No current facility-administered medications for this visit.     OBJECTIVE: Vitals:   12/22/16 1349  BP: 128/80  Pulse: 76  Resp: 14  Temp: 98.5 F (36.9 C)     Body mass index is 29.16 kg/m.    ECOG FS:0 - Asymptomatic  General: Well-developed, well-nourished, no acute distress. Eyes: Pink conjunctiva, anicteric sclera. Lungs: Clear to auscultation bilaterally. Heart: Regular rate  and rhythm. No rubs, murmurs, or gallops. Abdomen: Soft, nontender, nondistended. No organomegaly noted, normoactive bowel sounds. Musculoskeletal: No edema, cyanosis, or clubbing. Neuro: Alert, answering all questions appropriately. Cranial nerves grossly intact. Skin: No rashes or petechiae noted. Psych: Normal affect.   LAB RESULTS:  Lab Results  Component Value Date   NA 142 04/17/2009   K 3.7 04/17/2009   CL 109 04/17/2009   CO2 30 04/17/2009  GLUCOSE 95 04/17/2009   BUN 6 04/17/2009   CREATININE 1.2 04/17/2009   CALCIUM 8.8 04/17/2009   PROT 7.0 04/17/2009   ALBUMIN 3.9 04/17/2009   AST 20 04/17/2009   ALT 20 04/17/2009   ALKPHOS 54 04/17/2009   BILITOT 0.0 (L) 04/17/2009   GFRNONAA 69.21 04/17/2009   GFRAA 94 10/31/2006    Lab Results  Component Value Date   WBC 7.3 12/22/2016   NEUTROABS 4.1 12/22/2016   HGB 12.5 (L) 12/22/2016   HCT 37.9 (L) 12/22/2016   MCV 81.5 12/22/2016   PLT 219 12/22/2016   Lab Results  Component Value Date   IRON 87 08/18/2016   TIBC 359 08/18/2016   IRONPCTSAT 24 08/18/2016   Lab Results  Component Value Date   FERRITIN 24 08/18/2016     STUDIES: No results found.  ASSESSMENT: Iron deficiency anemia.  PLAN:    1.  Iron deficiency anemia: Patient's hemoglobin has trended down slightly, but is still within normal limits.  Iron stores are pending at time of dictation. Previously, the remainder of his laboratory work was either negative or within normal limits. He does not require additional IV Feraheme today. He last received Feraheme on April 22, 2016. Patient will return to clinic in 4 months with repeat laboratory work and further evaluation.   2. Family history of colon cancer:  Patient gets routine colonoscopies given his family history of colon cancer, all of which been unrevealing.  His next colonoscopy is due within the next year.  His genetic testing is negative.  Approximately 20 minutes was spent in discussion of  which greater than 50% was consultation.   Patient expressed understanding and was in agreement with this plan. He also understands that He can call clinic at any time with any questions, concerns, or complaints.    Lloyd Huger, MD   12/22/2016 2:19 PM

## 2016-12-21 ENCOUNTER — Other Ambulatory Visit: Payer: Self-pay | Admitting: Oncology

## 2016-12-22 ENCOUNTER — Inpatient Hospital Stay (HOSPITAL_BASED_OUTPATIENT_CLINIC_OR_DEPARTMENT_OTHER): Payer: BLUE CROSS/BLUE SHIELD | Admitting: Oncology

## 2016-12-22 ENCOUNTER — Inpatient Hospital Stay: Payer: BLUE CROSS/BLUE SHIELD

## 2016-12-22 ENCOUNTER — Encounter: Payer: Self-pay | Admitting: Oncology

## 2016-12-22 ENCOUNTER — Inpatient Hospital Stay: Payer: BLUE CROSS/BLUE SHIELD | Attending: Oncology

## 2016-12-22 VITALS — BP 128/80 | HR 76 | Temp 98.5°F | Resp 14 | Wt 215.0 lb

## 2016-12-22 DIAGNOSIS — I252 Old myocardial infarction: Secondary | ICD-10-CM | POA: Insufficient documentation

## 2016-12-22 DIAGNOSIS — Z86010 Personal history of colonic polyps: Secondary | ICD-10-CM | POA: Diagnosis not present

## 2016-12-22 DIAGNOSIS — F329 Major depressive disorder, single episode, unspecified: Secondary | ICD-10-CM

## 2016-12-22 DIAGNOSIS — D649 Anemia, unspecified: Secondary | ICD-10-CM

## 2016-12-22 DIAGNOSIS — K625 Hemorrhage of anus and rectum: Secondary | ICD-10-CM

## 2016-12-22 DIAGNOSIS — G8929 Other chronic pain: Secondary | ICD-10-CM

## 2016-12-22 DIAGNOSIS — K589 Irritable bowel syndrome without diarrhea: Secondary | ICD-10-CM | POA: Insufficient documentation

## 2016-12-22 DIAGNOSIS — Z87891 Personal history of nicotine dependence: Secondary | ICD-10-CM | POA: Diagnosis not present

## 2016-12-22 DIAGNOSIS — E785 Hyperlipidemia, unspecified: Secondary | ICD-10-CM

## 2016-12-22 DIAGNOSIS — Z800 Family history of malignant neoplasm of digestive organs: Secondary | ICD-10-CM

## 2016-12-22 DIAGNOSIS — D509 Iron deficiency anemia, unspecified: Secondary | ICD-10-CM | POA: Diagnosis not present

## 2016-12-22 DIAGNOSIS — Z8701 Personal history of pneumonia (recurrent): Secondary | ICD-10-CM | POA: Diagnosis not present

## 2016-12-22 DIAGNOSIS — M129 Arthropathy, unspecified: Secondary | ICD-10-CM | POA: Diagnosis not present

## 2016-12-22 DIAGNOSIS — K649 Unspecified hemorrhoids: Secondary | ICD-10-CM

## 2016-12-22 DIAGNOSIS — I10 Essential (primary) hypertension: Secondary | ICD-10-CM | POA: Insufficient documentation

## 2016-12-22 DIAGNOSIS — J45909 Unspecified asthma, uncomplicated: Secondary | ICD-10-CM | POA: Diagnosis not present

## 2016-12-22 DIAGNOSIS — G40909 Epilepsy, unspecified, not intractable, without status epilepticus: Secondary | ICD-10-CM

## 2016-12-22 DIAGNOSIS — Z87442 Personal history of urinary calculi: Secondary | ICD-10-CM | POA: Insufficient documentation

## 2016-12-22 DIAGNOSIS — D508 Other iron deficiency anemias: Secondary | ICD-10-CM

## 2016-12-22 DIAGNOSIS — E039 Hypothyroidism, unspecified: Secondary | ICD-10-CM | POA: Diagnosis not present

## 2016-12-22 LAB — CBC WITH DIFFERENTIAL/PLATELET
Basophils Absolute: 0.1 10*3/uL (ref 0–0.1)
Basophils Relative: 1 %
Eosinophils Absolute: 0.1 10*3/uL (ref 0–0.7)
Eosinophils Relative: 2 %
HCT: 37.9 % — ABNORMAL LOW (ref 40.0–52.0)
Hemoglobin: 12.5 g/dL — ABNORMAL LOW (ref 13.0–18.0)
Lymphocytes Relative: 31 %
Lymphs Abs: 2.2 10*3/uL (ref 1.0–3.6)
MCH: 26.9 pg (ref 26.0–34.0)
MCHC: 33 g/dL (ref 32.0–36.0)
MCV: 81.5 fL (ref 80.0–100.0)
Monocytes Absolute: 0.7 10*3/uL (ref 0.2–1.0)
Monocytes Relative: 10 %
Neutro Abs: 4.1 10*3/uL (ref 1.4–6.5)
Neutrophils Relative %: 56 %
Platelets: 219 10*3/uL (ref 150–440)
RBC: 4.65 MIL/uL (ref 4.40–5.90)
RDW: 15.7 % — ABNORMAL HIGH (ref 11.5–14.5)
WBC: 7.3 10*3/uL (ref 3.8–10.6)

## 2016-12-22 LAB — IRON AND TIBC
Iron: 61 ug/dL (ref 45–182)
Saturation Ratios: 15 % — ABNORMAL LOW (ref 17.9–39.5)
TIBC: 398 ug/dL (ref 250–450)
UIBC: 337 ug/dL

## 2016-12-22 LAB — FERRITIN: Ferritin: 12 ng/mL — ABNORMAL LOW (ref 24–336)

## 2016-12-22 NOTE — Progress Notes (Signed)
Patient is here for follow up with labs today. He states that he is feeling well today and denies having any pain. He states that he has been having some blood in his stools which he notices happening about every 2 weeks. He is feeling more tired than usual.

## 2016-12-23 ENCOUNTER — Encounter: Payer: Self-pay | Admitting: Gastroenterology

## 2017-01-04 DIAGNOSIS — E291 Testicular hypofunction: Secondary | ICD-10-CM | POA: Diagnosis not present

## 2017-02-09 ENCOUNTER — Other Ambulatory Visit: Payer: Self-pay

## 2017-02-09 MED ORDER — LOSARTAN POTASSIUM 50 MG PO TABS: 50.0000 mg | ORAL_TABLET | Freq: Every day | ORAL | 1 refills | Status: DC

## 2017-02-10 ENCOUNTER — Other Ambulatory Visit: Payer: Self-pay | Admitting: Nurse Practitioner

## 2017-02-10 MED ORDER — AZELASTINE HCL 0.1 % NA SOLN: 1.0000 | Freq: Two times a day (BID) | NASAL | 1 refills | Status: DC

## 2017-03-01 DIAGNOSIS — Z79891 Long term (current) use of opiate analgesic: Secondary | ICD-10-CM | POA: Diagnosis not present

## 2017-03-01 DIAGNOSIS — M5417 Radiculopathy, lumbosacral region: Secondary | ICD-10-CM | POA: Diagnosis not present

## 2017-03-01 DIAGNOSIS — G894 Chronic pain syndrome: Secondary | ICD-10-CM | POA: Diagnosis not present

## 2017-03-01 DIAGNOSIS — M961 Postlaminectomy syndrome, not elsewhere classified: Secondary | ICD-10-CM | POA: Diagnosis not present

## 2017-03-21 ENCOUNTER — Ambulatory Visit: Payer: Self-pay | Admitting: Nurse Practitioner

## 2017-03-30 ENCOUNTER — Other Ambulatory Visit: Payer: Self-pay

## 2017-03-30 ENCOUNTER — Other Ambulatory Visit: Payer: Self-pay | Admitting: Nurse Practitioner

## 2017-03-30 DIAGNOSIS — J45909 Unspecified asthma, uncomplicated: Secondary | ICD-10-CM

## 2017-03-30 DIAGNOSIS — J209 Acute bronchitis, unspecified: Secondary | ICD-10-CM

## 2017-03-30 DIAGNOSIS — R059 Cough, unspecified: Secondary | ICD-10-CM

## 2017-03-30 DIAGNOSIS — R05 Cough: Secondary | ICD-10-CM

## 2017-03-30 MED ORDER — BUPROPION HCL ER (XL) 300 MG PO TB24: 300.0000 mg | ORAL_TABLET | Freq: Every day | ORAL | 1 refills | Status: DC

## 2017-03-30 MED ORDER — LEVOFLOXACIN 500 MG PO TABS: 500.0000 mg | ORAL_TABLET | Freq: Every day | ORAL | 0 refills | Status: DC

## 2017-03-30 MED ORDER — HYDROCOD POLST-CPM POLST ER 10-8 MG/5ML PO SUER: 5.0000 mL | Freq: Two times a day (BID) | ORAL | 0 refills | Status: DC | PRN

## 2017-03-30 NOTE — Progress Notes (Signed)
Started levofloxacin 500mg  daily for 10 days. Added tussionex for cough which is twice daily as needed. He should use with caution as side effects tussionex may cause dizziness or drowsiness.

## 2017-03-31 ENCOUNTER — Other Ambulatory Visit: Payer: Self-pay

## 2017-03-31 MED ORDER — MELOXICAM 7.5 MG PO TABS: 7.5000 mg | ORAL_TABLET | Freq: Every day | ORAL | 3 refills | Status: DC

## 2017-04-01 ENCOUNTER — Ambulatory Visit: Payer: BLUE CROSS/BLUE SHIELD | Admitting: Nurse Practitioner

## 2017-04-01 ENCOUNTER — Encounter: Payer: Self-pay | Admitting: Nurse Practitioner

## 2017-04-01 VITALS — BP 134/72 | HR 72 | Resp 16 | Ht 72.0 in | Wt 221.0 lb

## 2017-04-01 DIAGNOSIS — F411 Generalized anxiety disorder: Secondary | ICD-10-CM | POA: Diagnosis not present

## 2017-04-01 DIAGNOSIS — R41840 Attention and concentration deficit: Secondary | ICD-10-CM

## 2017-04-01 DIAGNOSIS — E039 Hypothyroidism, unspecified: Secondary | ICD-10-CM | POA: Diagnosis not present

## 2017-04-01 DIAGNOSIS — I10 Essential (primary) hypertension: Secondary | ICD-10-CM

## 2017-04-01 DIAGNOSIS — IMO0001 Reserved for inherently not codable concepts without codable children: Secondary | ICD-10-CM

## 2017-04-01 DIAGNOSIS — J4521 Mild intermittent asthma with (acute) exacerbation: Secondary | ICD-10-CM

## 2017-04-01 MED ORDER — PREDNISONE 10 MG (21) PO TBPK: ORAL_TABLET | ORAL | 0 refills | Status: DC

## 2017-04-01 MED ORDER — AMPHETAMINE-DEXTROAMPHETAMINE 30 MG PO TABS: 30.0000 mg | ORAL_TABLET | Freq: Three times a day (TID) | ORAL | 0 refills | Status: DC

## 2017-04-01 NOTE — Progress Notes (Signed)
[facility] [place], [place] [address]  Internal MEDICINE  Office Visit Note  Patient Name: Randy Mclean  [id number]  [id number]  Date of Service: 04/10/2017  Chief Complaint  Patient presents with  . URI    URI   This is a recurrent problem. The current episode started 1 to 4 weeks ago. The problem has been waxing and waning. There has been no fever. Associated symptoms include congestion, coughing, headaches, rhinorrhea, sinus pain, a sore throat and wheezing. Pertinent negatives include no chest pain, diarrhea, nausea, rash or vomiting. Treatments tried: antibiotic, cough suppressant, and inhalers. The treatment provided mild relief.    Pt is here for routine follow up.    Current Medication: Outpatient Encounter Medications as of 04/01/2017  Medication Sig Note  . albuterol (PROVENTIL HFA;VENTOLIN HFA) 108 (90 Base) MCG/ACT inhaler Frequency:PHARMDIR   Dosage:90   MCG  Instructions:  Note:Dose: 35 MCG   . alfuzosin (UROXATRAL) 10 MG 24 hr tablet Take 10 mg by mouth daily.   Marland Kitchen ALPRAZolam (XANAX) 1 MG tablet 4 (four) times daily. As needed   . amphetamine-dextroamphetamine (ADDERALL) 30 MG tablet Take 1 tablet by mouth 3 (three) times daily.   Marland Kitchen atorvastatin (LIPITOR) 10 MG tablet Take 1 tablet by mouth daily. 12/27/2012: Received from: External Pharmacy Received Sig:   . azelastine (ASTELIN) 0.1 % nasal spray Place 1 spray into both nostrils 2 (two) times daily.   Marland Kitchen buPROPion (WELLBUTRIN XL) 300 MG 24 hr tablet Take 1 tablet (300 mg total) by mouth daily.   . citalopram (CELEXA) 20 MG tablet Take 20 mg by mouth daily.    . diphenhydrAMINE (SOMINEX) 25 MG tablet Take 25 mg by mouth as needed for sleep.   Marland Kitchen esomeprazole (NEXIUM) 40 MG capsule TK ONE C PO  BID UTD   . l-methylfolate-B6-B12 (METANX) 3-35-2 MG TABS Take 1 tablet by mouth 2 (two) times daily.   . Levothyroxine Sodium (TIROSINT) 150 MCG CAPS Take 1 capsule by mouth daily before  breakfast.   . losartan (COZAAR) 50 MG tablet Take 1 tablet (50 mg total) by mouth daily.   . meloxicam (MOBIC) 7.5 MG tablet Take 1 tablet (7.5 mg total) by mouth daily.   . metoprolol succinate (TOPROL-XL) 25 MG 24 hr tablet Take 25 mg by mouth daily.   . mometasone-formoterol (DULERA) 100-5 MCG/ACT AERO Inhale 2 puffs into the lungs every 12 (twelve) hours.   . Multiple Vitamin (MULTI-VITAMINS) TABS Take by mouth.   Gean Birchwood ER 200 MG TB12 200 mg bid   . oxyCODONE-acetaminophen (PERCOCET) 7.5-325 MG per tablet Take 1 tablet by mouth every 4 (four) hours as needed for pain.   . pregabalin (LYRICA) 50 MG capsule Take 100 mg by mouth 3 (three) times daily.    Marland Kitchen senna (SENOKOT) 8.6 MG tablet Take by mouth.   . SYRINGE-NEEDLE, DISP, 3 ML (MONOJECT 3CC SYR 22GX1") 22G X 1" 3 ML MISC Use as directed.   . testosterone cypionate (DEPOTESTOTERONE CYPIONATE) 100 MG/ML injection Inject 75 mg into the muscle every 7 (seven) days. For IM use only    . TIROSINT 50 MCG CAPS    . [DISCONTINUED] amphetamine-dextroamphetamine (ADDERALL) 30 MG tablet 1 Tablet 2 times a day, then 1/4 tablet around 6 pm   . [DISCONTINUED] amphetamine-dextroamphetamine (ADDERALL) 30 MG tablet Take 1 tablet by mouth 3 (three) times daily.   . [DISCONTINUED] amphetamine-dextroamphetamine (ADDERALL) 30 MG tablet Take 1 tablet by mouth 3 (three) times  daily.   . [DISCONTINUED] Fe Cbn-Fe Gluc-FA-B12-C-DSS (FERRALET 90 PO) Take 1 tablet by mouth daily.   . chlorpheniramine-HYDROcodone (TUSSIONEX PENNKINETIC ER) 10-8 MG/5ML SUER Take 5 mLs by mouth every 12 (twelve) hours as needed for cough. (Patient not taking: Reported on 04/01/2017)   . levofloxacin (LEVAQUIN) 500 MG tablet Take 1 tablet (500 mg total) by mouth daily.   . predniSONE (STERAPRED UNI-PAK 21 TAB) 10 MG (21) TBPK tablet 6 day taper - take by mouth as directed for 6 days    No facility-administered encounter medications on file as of 04/01/2017.     Surgical History: Past  Surgical History:  Procedure Laterality Date  . BACK SURGERY    . COLONOSCOPY  August 2015  . elbow surgery    . HEMORRHOID SURGERY    . LIPOMA EXCISION Right    leg  . 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 SPINAL CORD STIMULATOR IMPLANT    . UPPER GI ENDOSCOPY  August 2015    Medical History: Past Medical History:  Diagnosis Date  . Allergy    takes allergy shots  . Anemia   . Arthritis   . Asthma   . Barrett esophagus   . Bronchitis   . Chronic pain   . Colon polyp   . Depression   . GERD (gastroesophageal reflux disease)   . Hemorrhoid   . Hyperlipidemia   . Hypertension   . Hypothyroid   . IBS (irritable bowel syndrome)   . Kidney stone   . MI (myocardial infarction) ([place])   . Migraine   . Pneumonia   . Seizures ([place])   . Sinus problem   . Spinal cord stimulator status     Family History: Family History  Problem Relation Age of Onset  . Hypertension Mother   . Lung disease Mother   . Heart attack Father   . Diabetes Father     Social History   Socioeconomic History  . Marital status: Married    Spouse name: Not on file  . Number of children: Not on file  . Years of education: Not on file  . Highest education level: Not on file  Social Needs  . Financial resource strain: Not on file  . Food insecurity - worry: Not on file  . Food insecurity - inability: Not on file  . Transportation needs - medical: Not on file  . Transportation needs - non-medical: Not on file  Occupational History  . Not on file  Tobacco Use  . Smoking status: Former Smoker    Packs/day: 1.00    Years: 30.00    Pack years: 30.00    Types: Cigarettes    Last attempt to quit: 02/22/2005    Years since quitting: 12.1  . Smokeless tobacco: Never Used  Substance and Sexual Activity  . Alcohol use: Yes    Alcohol/week: 0.0 oz  . Drug use: No    Comment: past  . Sexual activity: Yes  Other Topics Concern  . Not on file  Social History Narrative  . Not on file       Review of Systems  Constitutional: Positive for fatigue and fever. Negative for activity change and unexpected weight change.  HENT: Positive for congestion, postnasal drip, rhinorrhea, sinus pressure, sinus pain, sore throat and voice change.   Eyes: Negative.   Respiratory: Positive for cough and wheezing.   Cardiovascular: Negative for chest pain and palpitations.  Gastrointestinal: Negative for constipation, diarrhea, nausea  and vomiting.  Endocrine:       Well-regulated hypothyroid.   Musculoskeletal: Positive for arthralgias and myalgias.  Skin: Negative for rash.  Allergic/Immunologic: Positive for environmental allergies.  Neurological: Positive for headaches.  Hematological: Positive for adenopathy.  Psychiatric/Behavioral: Positive for decreased concentration and sleep disturbance. The patient is nervous/anxious.     Today's Vitals   04/01/17 1206  BP: 134/72  Pulse: 72  Resp: 16  SpO2: 95%  Weight: 221 lb (100.2 kg)  Height: 6' (1.829 m)    Physical Exam  Constitutional: He is oriented to person, place, and time. He appears well-developed and well-nourished. No distress.  HENT:  Head: Normocephalic and atraumatic.  Right Ear: Tympanic membrane is erythematous and bulging.  Left Ear: Tympanic membrane is erythematous and bulging.  Nose: Rhinorrhea present. Right sinus exhibits maxillary sinus tenderness and frontal sinus tenderness. Left sinus exhibits maxillary sinus tenderness and frontal sinus tenderness.  Mouth/Throat: Posterior oropharyngeal erythema present. No oropharyngeal exudate.  Eyes: EOM are normal. Pupils are equal, round, and reactive to light.  Neck: Normal range of motion. Neck supple. No JVD present. No tracheal deviation present. No thyromegaly present.  Cardiovascular: Normal rate, regular rhythm and normal heart sounds. Exam reveals no gallop and no friction rub.  No murmur heard. Pulmonary/Chest: Effort normal. No respiratory distress.  He has wheezes. He has no rales. He exhibits no tenderness.  Congested, non-productive cough present  Abdominal: Soft. Bowel sounds are normal. There is no tenderness.  Musculoskeletal: Normal range of motion.  Lymphadenopathy:    He has cervical adenopathy.  Neurological: He is alert and oriented to person, place, and time. No cranial nerve deficit.  Skin: Skin is warm and dry. He is not diaphoretic.  Psychiatric: He has a normal mood and affect. His behavior is normal. Judgment and thought content normal.  Nursing note and vitals reviewed.   Assessment/Plan:  1. Moderate intermittent asthma with acute exacerbation Continue antibiotics and cough suppressant as already prescribed.  - predniSONE (STERAPRED UNI-PAK 21 TAB) 10 MG (21) TBPK tablet; 6 day taper - take by mouth as directed for 6 days  Dispense: 21 tablet; Refill: 0  2. Attention and concentration deficit Continue adderall as currently prescribed. Three 30 day prescriptions were provided. Dates are 04/01/2017, 04/27/2017, and 05/28/2017. - amphetamine-dextroamphetamine (ADDERALL) 30 MG tablet; Take 1 tablet by mouth 3 (three) times daily.  Dispense: 90 tablet; Refill: 0  3. Essential hypertension Stable. Continue bp medication as prescribed   4. Acquired hypothyroidism Thyroid panel stable continue tirosint as prescribed.   5. Anxiety state Continue alprazolam as needed and as prescribed.   General Counseling: alfio loescher understanding of the findings of todays visit and agrees with plan of treatment. I have discussed any further diagnostic evaluation that may be needed or ordered today. We also reviewed his medications today. he has been encouraged to call the office with any questions or concerns that should arise related to todays visit.    Meds ordered this encounter  Medications  . predniSONE (STERAPRED UNI-PAK 21 TAB) 10 MG (21) TBPK tablet    Sig: 6 day taper - take by mouth as directed for 6 days    Dispense:   21 tablet    Refill:  0    Order Specific Question:   Supervising Provider    Answer:   Lavera Guise [place]  . DISCONTD: amphetamine-dextroamphetamine (ADDERALL) 30 MG tablet    Sig: Take 1 tablet by mouth 3 (three) times daily.  Dispense:  90 tablet    Refill:  0    Order Specific Question:   Supervising Provider    Answer:   Lavera Guise [[id number]]  . DISCONTD: amphetamine-dextroamphetamine (ADDERALL) 30 MG tablet    Sig: Take 1 tablet by mouth 3 (three) times daily.    Dispense:  90 tablet    Refill:  0    Fill after 04/29/2017    Order Specific Question:   Supervising Provider    Answer:   Lavera Guise [[id number]]  . amphetamine-dextroamphetamine (ADDERALL) 30 MG tablet    Sig: Take 1 tablet by mouth 3 (three) times daily.    Dispense:  90 tablet    Refill:  0    Fill after 05/28/2017    Order Specific Question:   Supervising Provider    Answer:   Lavera Guise [[id number]]    Time spent: 79 Minutes   Dr Lavera Guise Internal medicine

## 2017-04-17 NOTE — Progress Notes (Signed)
Randy Mclean  [phone] [phone]) [phone]  ID: Randy Mclean Younger OB: 1960/11/29  MR#: [id number]  WKM#:[id number]  Patient Care Team: Lavera Guise, MD as PCP - General (Internal Medicine) Bary Castilla Forest Gleason, MD (General Surgery) Lavera Guise, MD (Internal Medicine)  CHIEF COMPLAINT: Iron deficiency anemia.  INTERVAL HISTORY: Patient returns to clinic today for repeat laboratory work, further evaluation, and consideration of additional IV iron. He he has noted increased weakness and fatigue over the past several weeks. He has no neurologic complaints.  He denies any recent fevers.  He denies any chest pain or shortness of breath.  He has a good appetite and denies weight loss.  He denies any nausea, vomiting, constipation, or diarrhea.  He has no melena or hematochezia.  He has no urinary complaints.  Patient offers no further specific complaints today.  REVIEW OF SYSTEMS:   Review of Systems  Constitutional: Negative for fever, malaise/fatigue and weight loss.  Respiratory: Negative.  Negative for cough and shortness of breath.   Cardiovascular: Negative.  Negative for chest pain and leg swelling.  Gastrointestinal: Negative.  Negative for abdominal pain, blood in stool and melena.  Genitourinary: Negative.  Negative for hematuria.  Musculoskeletal: Negative.   Skin: Negative.  Negative for rash.  Neurological: Negative.  Negative for sensory change and weakness.  Psychiatric/Behavioral: Negative.  The patient is not nervous/anxious.     As per HPI. Otherwise, a complete review of systems is negative.  PAST MEDICAL HISTORY: Past Medical History:  Diagnosis Date  . Allergy    takes allergy shots  . Anemia   . Arthritis   . Asthma   . Barrett esophagus   . Bronchitis   . Chronic pain   . Colon polyp   . Depression   . GERD (gastroesophageal reflux disease)   . Hemorrhoid   . Hyperlipidemia   . Hypertension   . Hypothyroid   . IBS  (irritable bowel syndrome)   . Kidney stone   . MI (myocardial infarction) ([place])   . Migraine   . Pneumonia   . Seizures ([place])   . Sinus problem   . Spinal cord stimulator status     PAST SURGICAL HISTORY: Past Surgical History:  Procedure Laterality Date  . BACK SURGERY    . COLONOSCOPY  August 2015  . elbow surgery    . HEMORRHOID SURGERY    . LIPOMA EXCISION Right    leg  . 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 SPINAL CORD STIMULATOR IMPLANT    . UPPER GI ENDOSCOPY  August 2015    FAMILY HISTORY Family History  Problem Relation Age of Onset  . Hypertension Mother   . Lung disease Mother   . Heart attack Father   . Diabetes Father        ADVANCED DIRECTIVES:    HEALTH MAINTENANCE: Social History   Tobacco Use  . Smoking status: Former Smoker    Packs/day: 1.00    Years: 30.00    Pack years: 30.00    Types: Cigarettes    Last attempt to quit: 02/22/2005    Years since quitting: 12.1  . Smokeless tobacco: Never Used  Substance Use Topics  . Alcohol use: Yes    Alcohol/week: 0.0 oz  . Drug use: No    Comment: past     Colonoscopy:  PAP:  Bone density:  Lipid panel:  Allergies  Allergen Reactions  . Amoxicillin Anaphylaxis  REACTION: unspecified  . Penicillin G Anaphylaxis  . Gabapentin Other (See Comments)    REACTION: anaphylaxis  . Penicillins     REACTION: unspecified  . Adhesive [Tape] Rash    Band-aid    Current Outpatient Medications  Medication Sig Dispense Refill  . albuterol (PROVENTIL HFA;VENTOLIN HFA) 108 (90 Base) MCG/ACT inhaler Frequency:PHARMDIR   Dosage:90   MCG  Instructions:  Note:Dose: 75 MCG    . alfuzosin (UROXATRAL) 10 MG 24 hr tablet Take 10 mg by mouth daily.    Marland Kitchen ALPRAZolam (XANAX) 1 MG tablet 4 (four) times daily. As needed    . amphetamine-dextroamphetamine (ADDERALL) 30 MG tablet Take 1 tablet by mouth 3 (three) times daily. 90 tablet 0  . atorvastatin (LIPITOR) 10 MG tablet Take 1 tablet by mouth  daily.    Marland Kitchen azelastine (ASTELIN) 0.1 % nasal spray Place 1 spray into both nostrils 2 (two) times daily. 30 mL 1  . buPROPion (WELLBUTRIN XL) 300 MG 24 hr tablet Take 1 tablet (300 mg total) by mouth daily. 90 tablet 1  . chlorpheniramine-HYDROcodone (TUSSIONEX PENNKINETIC ER) 10-8 MG/5ML SUER Take 5 mLs by mouth every 12 (twelve) hours as needed for cough. 115 mL 0  . citalopram (CELEXA) 20 MG tablet Take 20 mg by mouth daily.     . diphenhydrAMINE (SOMINEX) 25 MG tablet Take 25 mg by mouth as needed for sleep.    Marland Kitchen esomeprazole (NEXIUM) 40 MG capsule TK ONE C PO  BID UTD  0  . l-methylfolate-B6-B12 (METANX) 3-35-2 MG TABS Take 1 tablet by mouth 2 (two) times daily.    . Levothyroxine Sodium (TIROSINT) 150 MCG CAPS Take 1 capsule by mouth daily before breakfast.    . losartan (COZAAR) 50 MG tablet Take 1 tablet (50 mg total) by mouth daily. 90 tablet 1  . meloxicam (MOBIC) 7.5 MG tablet Take 1 tablet (7.5 mg total) by mouth daily. 180 tablet 3  . metoprolol succinate (TOPROL-XL) 25 MG 24 hr tablet Take 25 mg by mouth daily.    . mometasone-formoterol (DULERA) 100-5 MCG/ACT AERO Inhale 2 puffs into the lungs every 12 (twelve) hours.    . Multiple Vitamin (MULTI-VITAMINS) TABS Take by mouth.    Gean Birchwood ER 200 MG TB12 200 mg bid  0  . oxyCODONE-acetaminophen (PERCOCET) 7.5-325 MG per tablet Take 1 tablet by mouth every 4 (four) hours as needed for pain.    . pregabalin (LYRICA) 50 MG capsule Take 100 mg by mouth 3 (three) times daily.     Marland Kitchen senna (SENOKOT) 8.6 MG tablet Take by mouth.    . SYRINGE-NEEDLE, DISP, 3 ML (MONOJECT 3CC SYR 22GX1") 22G X 1" 3 ML MISC Use as directed.    . testosterone cypionate (DEPOTESTOTERONE CYPIONATE) 100 MG/ML injection Inject 75 mg into the muscle every 7 (seven) days. For IM use only     . TIROSINT 50 MCG CAPS   1   No current facility-administered medications for this visit.     OBJECTIVE: Vitals:   04/20/17 1401  Resp: 12     Body mass index is 29.57  kg/m.    ECOG FS:0 - Asymptomatic  General: Well-developed, well-nourished, no acute distress. Eyes: Pink conjunctiva, anicteric sclera. Lungs: Clear to auscultation bilaterally. Heart: Regular rate and rhythm. No rubs, murmurs, or gallops. Abdomen: Soft, nontender, nondistended. No organomegaly noted, normoactive bowel sounds. Musculoskeletal: No edema, cyanosis, or clubbing. Neuro: Alert, answering all questions appropriately. Cranial nerves grossly intact. Skin: No rashes or petechiae  noted. Psych: Normal affect.   LAB RESULTS:  Lab Results  Component Value Date   NA 142 04/17/2009   K 3.7 04/17/2009   CL 109 04/17/2009   CO2 30 04/17/2009   GLUCOSE 95 04/17/2009   BUN 6 04/17/2009   CREATININE 1.2 04/17/2009   CALCIUM 8.8 04/17/2009   PROT 7.0 04/17/2009   ALBUMIN 3.9 04/17/2009   AST 20 04/17/2009   ALT 20 04/17/2009   ALKPHOS 54 04/17/2009   BILITOT 0.0 (L) 04/17/2009   GFRNONAA 69.21 04/17/2009   GFRAA 94 10/31/2006    Lab Results  Component Value Date   WBC 7.8 04/20/2017   NEUTROABS 5.2 04/20/2017   HGB 10.8 (L) 04/20/2017   HCT 33.3 (L) 04/20/2017   MCV 72.0 (L) 04/20/2017   PLT 229 04/20/2017   Lab Results  Component Value Date   IRON 17 (L) 04/20/2017   TIBC 446 04/20/2017   IRONPCTSAT 4 (L) 04/20/2017   Lab Results  Component Value Date   FERRITIN 5 (L) 04/20/2017     STUDIES: No results found.  ASSESSMENT: Iron deficiency anemia.  PLAN:    1.  Iron deficiency anemia: Patient's hemoglobin and iron stores have trended down, and he is now symptomatic. Previously, the remainder of his laboratory work was either negative or within normal limits.  Proceed with 510 mg IV Feraheme today.  Return to clinic in 1 week for second infusion.  Patient will then return to clinic in 3 months with repeat laboratory work and further evaluation.   2. Family history of colon cancer:  Patient gets routine colonoscopies given his family history of colon cancer,  all of which been unrevealing.  His next colonoscopy is due within the next year.  His genetic testing is negative.  Approximately 30 minutes was spent in discussion of which greater than 50% was consultation.   Patient expressed understanding and was in agreement with this plan. He also understands that He can call clinic at any time with any questions, concerns, or complaints.    Lloyd Huger, MD   04/22/2017 2:36 PM

## 2017-04-20 ENCOUNTER — Encounter: Payer: Self-pay | Admitting: Oncology

## 2017-04-20 ENCOUNTER — Other Ambulatory Visit: Payer: Self-pay

## 2017-04-20 ENCOUNTER — Inpatient Hospital Stay: Payer: BLUE CROSS/BLUE SHIELD

## 2017-04-20 ENCOUNTER — Inpatient Hospital Stay: Payer: BLUE CROSS/BLUE SHIELD | Attending: Oncology

## 2017-04-20 ENCOUNTER — Inpatient Hospital Stay (HOSPITAL_BASED_OUTPATIENT_CLINIC_OR_DEPARTMENT_OTHER): Payer: BLUE CROSS/BLUE SHIELD | Admitting: Oncology

## 2017-04-20 VITALS — BP 128/83 | HR 73 | Temp 98.8°F | Resp 18

## 2017-04-20 VITALS — Resp 12 | Ht 72.0 in | Wt 218.0 lb

## 2017-04-20 DIAGNOSIS — G8929 Other chronic pain: Secondary | ICD-10-CM

## 2017-04-20 DIAGNOSIS — D509 Iron deficiency anemia, unspecified: Secondary | ICD-10-CM

## 2017-04-20 DIAGNOSIS — Z800 Family history of malignant neoplasm of digestive organs: Secondary | ICD-10-CM | POA: Diagnosis not present

## 2017-04-20 DIAGNOSIS — R531 Weakness: Secondary | ICD-10-CM | POA: Diagnosis not present

## 2017-04-20 DIAGNOSIS — D508 Other iron deficiency anemias: Secondary | ICD-10-CM

## 2017-04-20 DIAGNOSIS — Z79899 Other long term (current) drug therapy: Secondary | ICD-10-CM | POA: Insufficient documentation

## 2017-04-20 DIAGNOSIS — Z87891 Personal history of nicotine dependence: Secondary | ICD-10-CM | POA: Diagnosis not present

## 2017-04-20 LAB — CBC WITH DIFFERENTIAL/PLATELET
Basophils Absolute: 0.1 10*3/uL (ref 0–0.1)
Basophils Relative: 1 %
Eosinophils Absolute: 0.2 10*3/uL (ref 0–0.7)
Eosinophils Relative: 3 %
HCT: 33.3 % — ABNORMAL LOW (ref 40.0–52.0)
Hemoglobin: 10.8 g/dL — ABNORMAL LOW (ref 13.0–18.0)
Lymphocytes Relative: 21 %
Lymphs Abs: 1.6 10*3/uL (ref 1.0–3.6)
MCH: 23.3 pg — ABNORMAL LOW (ref 26.0–34.0)
MCHC: 32.4 g/dL (ref 32.0–36.0)
MCV: 72 fL — ABNORMAL LOW (ref 80.0–100.0)
Monocytes Absolute: 0.6 10*3/uL (ref 0.2–1.0)
Monocytes Relative: 8 %
Neutro Abs: 5.2 10*3/uL (ref 1.4–6.5)
Neutrophils Relative %: 67 %
Platelets: 229 10*3/uL (ref 150–440)
RBC: 4.62 MIL/uL (ref 4.40–5.90)
RDW: 16.5 % — ABNORMAL HIGH (ref 11.5–14.5)
WBC: 7.8 10*3/uL (ref 3.8–10.6)

## 2017-04-20 LAB — IRON AND TIBC
Iron: 17 ug/dL — ABNORMAL LOW (ref 45–182)
Saturation Ratios: 4 % — ABNORMAL LOW (ref 17.9–39.5)
TIBC: 446 ug/dL (ref 250–450)
UIBC: 429 ug/dL

## 2017-04-20 LAB — FERRITIN: Ferritin: 5 ng/mL — ABNORMAL LOW (ref 24–336)

## 2017-04-20 MED ORDER — SODIUM CHLORIDE 0.9 % IV SOLN
510.0000 mg | Freq: Once | INTRAVENOUS | Status: AC
  Administered 2017-04-20: 510 mg via INTRAVENOUS
  Filled 2017-04-20: qty 17

## 2017-04-20 MED ORDER — SODIUM CHLORIDE 0.9 % IV SOLN
Freq: Once | INTRAVENOUS | Status: AC
Start: 2017-04-20 — End: 2017-04-20
  Administered 2017-04-20: 15:00:00 via INTRAVENOUS
  Filled 2017-04-20: qty 1000

## 2017-04-20 NOTE — Progress Notes (Signed)
Patient here for follow up he has a productive cough and has been treated for bronchitis and sinus infection by PCP. He reports that he has been feeling more tired lately. His BP is elevated.

## 2017-04-20 NOTE — Progress Notes (Signed)
Pt and VS stable at discharge. 

## 2017-04-24 ENCOUNTER — Other Ambulatory Visit: Payer: Self-pay | Admitting: Internal Medicine

## 2017-04-27 ENCOUNTER — Inpatient Hospital Stay: Payer: BLUE CROSS/BLUE SHIELD | Attending: Oncology

## 2017-04-27 VITALS — BP 156/83 | HR 80 | Temp 98.7°F | Resp 18

## 2017-04-27 DIAGNOSIS — D508 Other iron deficiency anemias: Secondary | ICD-10-CM | POA: Insufficient documentation

## 2017-04-27 MED ORDER — SODIUM CHLORIDE 0.9 % IV SOLN
Freq: Once | INTRAVENOUS | Status: AC
  Administered 2017-04-27: 14:00:00 via INTRAVENOUS
  Filled 2017-04-27: qty 1000

## 2017-04-27 MED ORDER — SODIUM CHLORIDE 0.9 % IV SOLN
510.0000 mg | Freq: Once | INTRAVENOUS | Status: AC
  Administered 2017-04-27: 510 mg via INTRAVENOUS
  Filled 2017-04-27: qty 17

## 2017-05-06 ENCOUNTER — Other Ambulatory Visit: Payer: Self-pay

## 2017-05-06 MED ORDER — TIROSINT 50 MCG PO CAPS: ORAL_CAPSULE | ORAL | 3 refills | Status: DC

## 2017-05-10 ENCOUNTER — Telehealth: Payer: Self-pay

## 2017-05-10 NOTE — Telephone Encounter (Signed)
I actually did send three prescriptions for him to the pharmacy when he was here at visit. The second was dated for march, and the last dated 05/28/2017. Has he checked with the pharmacy? All were sent electronically.

## 2017-05-11 NOTE — Telephone Encounter (Signed)
Spoke to pt and advised to check with his pharmacy on his refills.  dbs

## 2017-05-31 DIAGNOSIS — Z79891 Long term (current) use of opiate analgesic: Secondary | ICD-10-CM | POA: Diagnosis not present

## 2017-05-31 DIAGNOSIS — G894 Chronic pain syndrome: Secondary | ICD-10-CM | POA: Diagnosis not present

## 2017-05-31 DIAGNOSIS — M5417 Radiculopathy, lumbosacral region: Secondary | ICD-10-CM | POA: Diagnosis not present

## 2017-05-31 DIAGNOSIS — M961 Postlaminectomy syndrome, not elsewhere classified: Secondary | ICD-10-CM | POA: Diagnosis not present

## 2017-06-28 ENCOUNTER — Telehealth: Payer: Self-pay

## 2017-06-28 ENCOUNTER — Other Ambulatory Visit: Payer: Self-pay | Admitting: Nurse Practitioner

## 2017-06-28 DIAGNOSIS — F411 Generalized anxiety disorder: Principal | ICD-10-CM

## 2017-06-28 DIAGNOSIS — F410 Panic disorder [episodic paroxysmal anxiety] without agoraphobia: Secondary | ICD-10-CM

## 2017-06-28 MED ORDER — ALPRAZOLAM 1 MG PO TABS: 1.0000 mg | ORAL_TABLET | Freq: Three times a day (TID) | ORAL | 1 refills | Status: DC | PRN

## 2017-06-28 NOTE — Telephone Encounter (Signed)
Approved 90 day prescription for patient;s alprazolam 1mg  which is TID as needed. Sent to [facility].

## 2017-06-28 NOTE — Progress Notes (Signed)
Approved 90 day prescription for patient;s alprazolam 1mg  which is TID as needed. Sent to [facility].

## 2017-06-30 ENCOUNTER — Other Ambulatory Visit: Payer: Self-pay

## 2017-06-30 ENCOUNTER — Telehealth: Payer: Self-pay

## 2017-06-30 DIAGNOSIS — F411 Generalized anxiety disorder: Principal | ICD-10-CM

## 2017-06-30 DIAGNOSIS — F410 Panic disorder [episodic paroxysmal anxiety] without agoraphobia: Secondary | ICD-10-CM

## 2017-06-30 MED ORDER — ALPRAZOLAM 1 MG PO TABS: 1.0000 mg | ORAL_TABLET | Freq: Three times a day (TID) | ORAL | 1 refills | Status: DC | PRN

## 2017-06-30 NOTE — Telephone Encounter (Signed)
rx for adderall was sent in for a quantity of 870 but [facility] called and I called back to change the quantity of 270.

## 2017-07-01 ENCOUNTER — Encounter: Payer: Self-pay | Admitting: Nurse Practitioner

## 2017-07-01 ENCOUNTER — Ambulatory Visit: Payer: BLUE CROSS/BLUE SHIELD | Admitting: Nurse Practitioner

## 2017-07-01 VITALS — BP 109/60 | HR 61 | Resp 16 | Ht 72.0 in | Wt 214.2 lb

## 2017-07-01 DIAGNOSIS — D509 Iron deficiency anemia, unspecified: Secondary | ICD-10-CM

## 2017-07-01 DIAGNOSIS — M064 Inflammatory polyarthropathy: Secondary | ICD-10-CM | POA: Diagnosis not present

## 2017-07-01 DIAGNOSIS — R41840 Attention and concentration deficit: Secondary | ICD-10-CM | POA: Diagnosis not present

## 2017-07-01 DIAGNOSIS — R7301 Impaired fasting glucose: Secondary | ICD-10-CM | POA: Diagnosis not present

## 2017-07-01 DIAGNOSIS — R5383 Other fatigue: Secondary | ICD-10-CM

## 2017-07-01 DIAGNOSIS — E039 Hypothyroidism, unspecified: Secondary | ICD-10-CM

## 2017-07-01 DIAGNOSIS — E538 Deficiency of other specified B group vitamins: Secondary | ICD-10-CM

## 2017-07-01 MED ORDER — PREDNISONE 10 MG (21) PO TBPK: ORAL_TABLET | ORAL | 0 refills | Status: DC

## 2017-07-01 MED ORDER — AMPHETAMINE-DEXTROAMPHETAMINE 30 MG PO TABS: 30.0000 mg | ORAL_TABLET | Freq: Three times a day (TID) | ORAL | 0 refills | Status: DC

## 2017-07-01 NOTE — Progress Notes (Signed)
[facility] [place], [place] [address]  Internal MEDICINE  Office Visit Note  Patient Name: Randy Mclean Younger  [id number]  [id number]  Date of Service: 07/27/2017    Pt is here for routine follow up.   Chief Complaint  Patient presents with  . Fatigue    been going on for about a week, has been drinking plenty of fluids, no fever or sickness  . Joint Pain    joint,elbow, wrist pain started about a week ago    The patient isc/o fatigue and generalized joint pain. He does suffer from chronic pain and sees pain management for this. New pain is different and is affecting more than his back. Pain medications,prescribed by pain management do not seem to be helping the pain at all.      Current Medication: Outpatient Encounter Medications as of 07/01/2017  Medication Sig Note  . albuterol (PROVENTIL HFA;VENTOLIN HFA) 108 (90 Base) MCG/ACT inhaler Frequency:PHARMDIR   Dosage:90   MCG  Instructions:  Note:Dose: 81 MCG   . alfuzosin (UROXATRAL) 10 MG 24 hr tablet Take 10 mg by mouth daily.   Marland Kitchen ALPRAZolam (XANAX) 1 MG tablet Take 1 tablet (1 mg total) by mouth 3 (three) times daily as needed for anxiety.   Marland Kitchen amphetamine-dextroamphetamine (ADDERALL) 30 MG tablet Take 1 tablet by mouth 3 (three) times daily.   Marland Kitchen atorvastatin (LIPITOR) 10 MG tablet Take 1 tablet by mouth daily. 12/27/2012: Received from: External Pharmacy Received Sig:   . azelastine (ASTELIN) 0.1 % nasal spray Place 1 spray into both nostrils 2 (two) times daily.   Marland Kitchen buPROPion (WELLBUTRIN XL) 300 MG 24 hr tablet Take 1 tablet (300 mg total) by mouth daily.   . chlorpheniramine-HYDROcodone (TUSSIONEX PENNKINETIC ER) 10-8 MG/5ML SUER Take 5 mLs by mouth every 12 (twelve) hours as needed for cough. (Patient not taking: Reported on 07/21/2017)   . citalopram (CELEXA) 20 MG tablet Take 20 mg by mouth daily.    . diphenhydrAMINE (SOMINEX) 25 MG tablet Take 25 mg by mouth as needed for sleep.   Marland Kitchen  esomeprazole (NEXIUM) 40 MG capsule TK ONE C PO  BID UTD   . l-methylfolate-B6-B12 (METANX) 3-35-2 MG TABS Take 1 tablet by mouth 2 (two) times daily.   Marland Kitchen losartan (COZAAR) 50 MG tablet Take 1 tablet (50 mg total) by mouth daily.   . meloxicam (MOBIC) 7.5 MG tablet Take 1 tablet (7.5 mg total) by mouth daily.   . metoprolol succinate (TOPROL-XL) 25 MG 24 hr tablet Take 25 mg by mouth daily.   . mometasone-formoterol (DULERA) 100-5 MCG/ACT AERO Inhale 2 puffs into the lungs every 12 (twelve) hours.   . Multiple Vitamin (MULTI-VITAMINS) TABS Take by mouth.   Gean Birchwood ER 200 MG TB12 200 mg bid   . oxyCODONE-acetaminophen (PERCOCET) 7.5-325 MG per tablet Take 1 tablet by mouth every 4 (four) hours as needed for pain.    . pregabalin (LYRICA) 50 MG capsule Take 100 mg by mouth 3 (three) times daily.    Marland Kitchen senna (SENOKOT) 8.6 MG tablet Take by mouth.   . SYRINGE-NEEDLE, DISP, 3 ML (MONOJECT 3CC SYR 22GX1") 22G X 1" 3 ML MISC Use as directed.   . testosterone cypionate (DEPOTESTOTERONE CYPIONATE) 100 MG/ML injection Inject 75 mg into the muscle every 7 (seven) days. For IM use only    . TIROSINT 50 MCG CAPS Take 1 capsule(s) by mouth daily with tirosint 127mcg - total of 236mcg   . [  DISCONTINUED] amphetamine-dextroamphetamine (ADDERALL) 30 MG tablet Take 1 tablet by mouth 3 (three) times daily.   . [DISCONTINUED] amphetamine-dextroamphetamine (ADDERALL) 30 MG tablet Take 1 tablet by mouth 3 (three) times daily.   . [DISCONTINUED] amphetamine-dextroamphetamine (ADDERALL) 30 MG tablet Take 1 tablet by mouth 3 (three) times daily.   . [DISCONTINUED] TIROSINT 150 MCG CAPS TAKE ONE CAPSULE BY MOUTH EVERY MORNING FOR HYPOTHYROIDISM ONE-HALF HOUR BEFORE A MEAL   . [DISCONTINUED] predniSONE (STERAPRED UNI-PAK 21 TAB) 10 MG (21) TBPK tablet 6 day taper - take by mouth as directed for 6 days    No facility-administered encounter medications on file as of 07/01/2017.     Surgical History: Past Surgical History:   Procedure Laterality Date  . BACK SURGERY    . COLONOSCOPY  August 2015  . elbow surgery    . HEMORRHOID SURGERY    . LIPOMA EXCISION Right    leg  . 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 SPINAL CORD STIMULATOR IMPLANT    . UPPER GI ENDOSCOPY  August 2015    Medical History: Past Medical History:  Diagnosis Date  . Allergy    takes allergy shots  . Anemia   . Arthritis   . Asthma   . Barrett esophagus   . Bronchitis   . Chronic pain   . Colon polyp   . Depression   . GERD (gastroesophageal reflux disease)   . Hemorrhoid   . Hyperlipidemia   . Hypertension   . Hypothyroid   . IBS (irritable bowel syndrome)   . Kidney stone   . MI (myocardial infarction) ([place])   . Migraine   . Pneumonia   . Seizures ([place])   . Sinus problem   . Spinal cord stimulator status     Family History: Family History  Problem Relation Age of Onset  . Hypertension Mother   . Lung disease Mother   . Heart attack Father   . Diabetes Father     Social History   Socioeconomic History  . Marital status: Married    Spouse name: Not on file  . Number of children: Not on file  . Years of education: Not on file  . Highest education level: Not on file  Occupational History  . Not on file  Social Needs  . Financial resource strain: Not on file  . Food insecurity:    Worry: Not on file    Inability: Not on file  . Transportation needs:    Medical: Not on file    Non-medical: Not on file  Tobacco Use  . Smoking status: Former Smoker    Packs/day: 1.00    Years: 30.00    Pack years: 30.00    Types: Cigarettes    Last attempt to quit: 02/22/2005    Years since quitting: 12.4  . Smokeless tobacco: Never Used  Substance and Sexual Activity  . Alcohol use: Yes    Alcohol/week: 0.0 oz    Comment: occasionally  . Drug use: No    Comment: past  . Sexual activity: Yes  Lifestyle  . Physical activity:    Days per week: Not on file    Minutes per session: Not on file  .  Stress: Not on file  Relationships  . Social connections:    Talks on phone: Not on file    Gets together: Not on file    Attends religious service: Not on file    Active member of club or  organization: Not on file    Attends meetings of clubs or organizations: Not on file    Relationship status: Not on file  . Intimate partner violence:    Fear of current or ex partner: Not on file    Emotionally abused: Not on file    Physically abused: Not on file    Forced sexual activity: Not on file  Other Topics Concern  . Not on file  Social History Narrative  . Not on file      Review of Systems  Constitutional: Positive for activity change and fatigue. Negative for chills, fever and unexpected weight change.  HENT: Positive for congestion and postnasal drip. Negative for rhinorrhea, sinus pressure, sneezing, sore throat and voice change.   Eyes: Negative.  Negative for redness.  Respiratory: Positive for cough and wheezing. Negative for chest tightness and shortness of breath.   Cardiovascular: Negative for chest pain and palpitations.  Gastrointestinal: Positive for nausea. Negative for abdominal pain, constipation, diarrhea and vomiting.  Endocrine: Negative for cold intolerance, heat intolerance, polydipsia, polyphagia and polyuria.  Genitourinary: Negative for dysuria and frequency.  Musculoskeletal: Positive for arthralgias, back pain and myalgias. Negative for joint swelling and neck pain.  Skin: Negative for rash.  Allergic/Immunologic: Positive for environmental allergies.  Neurological: Positive for headaches. Negative for tremors and numbness.  Hematological: Negative for adenopathy. Does not bruise/bleed easily.  Psychiatric/Behavioral: Positive for dysphoric mood. Negative for behavioral problems (Depression), sleep disturbance and suicidal ideas. The patient is nervous/anxious.     Today's Vitals   07/01/17 1525  BP: 109/60  Pulse: 61  Resp: 16  SpO2: 96%  Weight:  214 lb 3.2 oz (97.2 kg)  Height: 6' (1.829 m)    Physical Exam  Constitutional: He is oriented to person, place, and time. He appears well-developed and well-nourished. No distress.  HENT:  Head: Normocephalic and atraumatic.  Right Ear: Tympanic membrane is erythematous and bulging.  Left Ear: Tympanic membrane is erythematous and bulging.  Nose: Rhinorrhea present. Right sinus exhibits maxillary sinus tenderness and frontal sinus tenderness. Left sinus exhibits maxillary sinus tenderness and frontal sinus tenderness.  Mouth/Throat: Posterior oropharyngeal erythema present. No oropharyngeal exudate.  Eyes: Pupils are equal, round, and reactive to light. EOM are normal.  Neck: Normal range of motion. Neck supple. No JVD present. No tracheal deviation present. No thyromegaly present.  Cardiovascular: Normal rate, regular rhythm and normal heart sounds. Exam reveals no gallop and no friction rub.  No murmur heard. Pulmonary/Chest: Effort normal and breath sounds normal. No respiratory distress. He has no wheezes. He has no rales. He exhibits no tenderness.  Congested, non-productive cough present  Abdominal: Soft. Bowel sounds are normal. There is no tenderness.  Musculoskeletal: Normal range of motion.  Generalized joint pain with point tenderness present. No visible abnormalities or joint deformities are noted.   Lymphadenopathy:    He has no cervical adenopathy.  Neurological: He is alert and oriented to person, place, and time. No cranial nerve deficit.  Skin: Skin is warm and dry. He is not diaphoretic.  Psychiatric: He has a normal mood and affect. His behavior is normal. Judgment and thought content normal.  Nursing note and vitals reviewed.  Assessment/Plan: 1. Inflammatory polyarthritis ([place]) Will start prednisone 10mg , six day taper to help reduce pain and inflammation. Check connective tissue labs.  - ANA w/Reflex if Positive - Rheumatoid factor - Sedimentation rate - Uric  acid  2. Iron deficiency anemia, unspecified iron deficiency anemia type Will check anemia  panel and treat as indicated  - CBC With Differential - Iron, TIBC and Ferritin Panel  3. Other fatigue - Comprehensive metabolic panel  4. Acquired hypothyroidism Check thyroid panel and adjust tirosint as indicated.  - TSH + free T4  5. Vitamin B12 deficiency - Vitamin B12  6. Impaired fasting glucose - Hemoglobin A1c  7. Attention and concentration deficit May continue current dosing of adderall. Three 30 day prescriptions were provided for the pharmacy. Dates are 07/01/2017, 07/30/2017, and 08/27/2017 - amphetamine-dextroamphetamine (ADDERALL) 30 MG tablet; Take 1 tablet by mouth 3 (three) times daily.  Dispense: 90 tablet; Refill: 0  General Counseling: Jaelynn verbalizes understanding of the findings of todays visit and agrees with plan of treatment. I have discussed any further diagnostic evaluation that may be needed or ordered today. We also reviewed his medications today. he has been encouraged to call the office with any questions or concerns that should arise related to todays visit.    Counseling:  This patient was seen by Leretha Pol, FNP- C in Collaboration with Dr Lavera Guise as a part of collaborative care agreement  Orders Placed This Encounter  Procedures  . CBC With Differential  . Comprehensive metabolic panel  . TSH + free T4  . Iron, TIBC and Ferritin Panel  . Vitamin B12  . Hemoglobin A1c  . ANA w/Reflex if Positive  . Rheumatoid factor  . Sedimentation rate  . Uric acid    Meds ordered this encounter  Medications  . DISCONTD: amphetamine-dextroamphetamine (ADDERALL) 30 MG tablet    Sig: Take 1 tablet by mouth 3 (three) times daily.    Dispense:  90 tablet    Refill:  0    Order Specific Question:   Supervising Provider    Answer:   Lavera Guise [[id number]]  . DISCONTD: amphetamine-dextroamphetamine (ADDERALL) 30 MG tablet    Sig: Take 1 tablet by mouth 3  (three) times daily.    Dispense:  90 tablet    Refill:  0    Fill after 07/30/2017    Order Specific Question:   Supervising Provider    Answer:   Lavera Guise [[id number]]  . amphetamine-dextroamphetamine (ADDERALL) 30 MG tablet    Sig: Take 1 tablet by mouth 3 (three) times daily.    Dispense:  90 tablet    Refill:  0    Fill after 07/28/2017    Order Specific Question:   Supervising Provider    Answer:   Lavera Guise [[id number]]  . DISCONTD: predniSONE (STERAPRED UNI-PAK 21 TAB) 10 MG (21) TBPK tablet    Sig: 6 day taper - take by mouth as directed for 6 days    Dispense:  21 tablet    Refill:  0    Order Specific Question:   Supervising Provider    Answer:   Lavera Guise [[id number]]    Time spent: 55 Minutes          Dr Lavera Guise Internal medicine

## 2017-07-19 ENCOUNTER — Other Ambulatory Visit: Payer: Self-pay | Admitting: Nurse Practitioner

## 2017-07-19 MED ORDER — TIROSINT 150 MCG PO CAPS: ORAL_CAPSULE | ORAL | 3 refills | Status: DC

## 2017-07-19 NOTE — Progress Notes (Signed)
[place]  [phone] [phone]  ID: Randy Mclean Younger OB: [birth year]  MR#: [id number]  OAC#:[id number]  Patient Care Team: Lavera Guise, MD as PCP - General (Internal Medicine) Bary Castilla Forest Gleason, MD (General Surgery) Lavera Guise, MD (Internal Medicine)  CHIEF COMPLAINT: Iron deficiency anemia.  INTERVAL HISTORY: Patient returns to clinic today for further evaluation and consideration of additional IV Feraheme.  He continues to have chronic weakness and fatigue, but otherwise feels well. He has no neurologic complaints.  He denies any recent fevers.  He denies any chest pain or shortness of breath.  He has a good appetite and denies weight loss.  He denies any nausea, vomiting, constipation, or diarrhea.  He has no melena or hematochezia.  He has no urinary complaints.  Patient offers no further specific complaints today.  REVIEW OF SYSTEMS:   Review of Systems  Constitutional: Positive for malaise/fatigue. Negative for fever and weight loss.  Respiratory: Negative.  Negative for cough and shortness of breath.   Cardiovascular: Negative.  Negative for chest pain and leg swelling.  Gastrointestinal: Negative.  Negative for abdominal pain, blood in stool and melena.  Genitourinary: Negative.  Negative for hematuria.  Musculoskeletal: Negative.  Negative for back pain.  Skin: Negative.  Negative for rash.  Neurological: Positive for weakness. Negative for sensory change and focal weakness.  Psychiatric/Behavioral: Negative.  The patient is not nervous/anxious.     As per HPI. Otherwise, a complete review of systems is negative.  PAST MEDICAL HISTORY: Past Medical History:  Diagnosis Date  . Allergy    takes allergy shots  . Anemia   . Arthritis   . Asthma   . Barrett esophagus   . Bronchitis   . Chronic pain   . Colon polyp   . Depression   . GERD (gastroesophageal reflux disease)   . Hemorrhoid   . Hyperlipidemia   .  Hypertension   . Hypothyroid   . IBS (irritable bowel syndrome)   . Kidney stone   . MI (myocardial infarction) ([place])   . Migraine   . Pneumonia   . Seizures ([place])   . Sinus problem   . Spinal cord stimulator status     PAST SURGICAL HISTORY: Past Surgical History:  Procedure Laterality Date  . BACK SURGERY    . COLONOSCOPY  August 2015  . elbow surgery    . HEMORRHOID SURGERY    . LIPOMA EXCISION Right    leg  . 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 SPINAL CORD STIMULATOR IMPLANT    . UPPER GI ENDOSCOPY  August 2015    FAMILY HISTORY Family History  Problem Relation Age of Onset  . Hypertension Mother   . Lung disease Mother   . Heart attack Father   . Diabetes Father        ADVANCED DIRECTIVES:    HEALTH MAINTENANCE: Social History   Tobacco Use  . Smoking status: Former Smoker    Packs/day: 1.00    Years: 30.00    Pack years: 30.00    Types: Cigarettes    Last attempt to quit: 02/22/2005    Years since quitting: 12.4  . Smokeless tobacco: Never Used  Substance Use Topics  . Alcohol use: Yes    Alcohol/week: 0.0 oz    Comment: occasionally  . Drug use: No    Comment: past     Colonoscopy:  PAP:  Bone density:  Lipid panel:  Allergies  Allergen Reactions  . Amoxicillin Anaphylaxis    REACTION: unspecified  . Penicillin G Anaphylaxis  . Gabapentin Other (See Comments)    REACTION: anaphylaxis  . Penicillins     REACTION: unspecified  . Adhesive [Tape] Rash    Band-aid    Current Outpatient Medications  Medication Sig Dispense Refill  . alfuzosin (UROXATRAL) 10 MG 24 hr tablet Take 10 mg by mouth daily.    Marland Kitchen ALPRAZolam (XANAX) 1 MG tablet Take 1 tablet (1 mg total) by mouth 3 (three) times daily as needed for anxiety. 270 tablet 1  . amphetamine-dextroamphetamine (ADDERALL) 30 MG tablet Take 1 tablet by mouth 3 (three) times daily. 90 tablet 0  . atorvastatin (LIPITOR) 10 MG tablet Take 1 tablet by mouth daily.    Marland Kitchen azelastine  (ASTELIN) 0.1 % nasal spray Place 1 spray into both nostrils 2 (two) times daily. 30 mL 1  . buPROPion (WELLBUTRIN XL) 300 MG 24 hr tablet Take 1 tablet (300 mg total) by mouth daily. 90 tablet 1  . citalopram (CELEXA) 20 MG tablet Take 20 mg by mouth daily.     . diphenhydrAMINE (SOMINEX) 25 MG tablet Take 25 mg by mouth as needed for sleep.    Marland Kitchen esomeprazole (NEXIUM) 40 MG capsule TK ONE C PO  BID UTD  0  . l-methylfolate-B6-B12 (METANX) 3-35-2 MG TABS Take 1 tablet by mouth 2 (two) times daily.    Marland Kitchen losartan (COZAAR) 50 MG tablet Take 1 tablet (50 mg total) by mouth daily. 90 tablet 1  . meloxicam (MOBIC) 7.5 MG tablet Take 1 tablet (7.5 mg total) by mouth daily. 180 tablet 3  . metoprolol succinate (TOPROL-XL) 25 MG 24 hr tablet Take 25 mg by mouth daily.    . mometasone-formoterol (DULERA) 100-5 MCG/ACT AERO Inhale 2 puffs into the lungs every 12 (twelve) hours.    . Multiple Vitamin (MULTI-VITAMINS) TABS Take by mouth.    Gean Birchwood ER 200 MG TB12 200 mg bid  0  . oxyCODONE-acetaminophen (PERCOCET) 7.5-325 MG per tablet Take 1 tablet by mouth every 4 (four) hours as needed for pain.     . pregabalin (LYRICA) 50 MG capsule Take 100 mg by mouth 3 (three) times daily.     Marland Kitchen senna (SENOKOT) 8.6 MG tablet Take by mouth.    . testosterone cypionate (DEPOTESTOTERONE CYPIONATE) 100 MG/ML injection Inject 75 mg into the muscle every 7 (seven) days. For IM use only     . TIROSINT 150 MCG CAPS TAKE ONE CAPSULE BY MOUTH EVERY MORNING FOR HYPOTHYROIDISM ONE-HALF HOUR BEFORE A MEAL 90 capsule 3  . TIROSINT 50 MCG CAPS Take 1 capsule(s) by mouth daily with tirosint 142mcg - total of 263mcg 90 capsule 3  . albuterol (PROVENTIL HFA;VENTOLIN HFA) 108 (90 Base) MCG/ACT inhaler Frequency:PHARMDIR   Dosage:90   MCG  Instructions:  Note:Dose: 90 MCG    . chlorpheniramine-HYDROcodone (TUSSIONEX PENNKINETIC ER) 10-8 MG/5ML SUER Take 5 mLs by mouth every 12 (twelve) hours as needed for cough. (Patient not taking:  Reported on 07/21/2017) 115 mL 0  . SYRINGE-NEEDLE, DISP, 3 ML (MONOJECT 3CC SYR 22GX1") 22G X 1" 3 ML MISC Use as directed.     No current facility-administered medications for this visit.     OBJECTIVE: Vitals:   07/21/17 1512  BP: 129/61  Pulse: 65  Resp: 18  Temp: (!) 97.1 F (36.2 C)     Body mass index is 29.1 kg/m.    ECOG FS:0 - Asymptomatic  General: Well-developed, well-nourished, no acute distress. Eyes: Pink conjunctiva, anicteric sclera. Lungs: Clear to auscultation bilaterally. Heart: Regular rate and rhythm. No rubs, murmurs, or gallops. Abdomen: Soft, nontender, nondistended. No organomegaly noted, normoactive bowel sounds. Musculoskeletal: No edema, cyanosis, or clubbing. Neuro: Alert, answering all questions appropriately. Cranial nerves grossly intact. Skin: No rashes or petechiae noted. Psych: Normal affect.  LAB RESULTS:  Lab Results  Component Value Date   NA 142 04/17/2009   K 3.7 04/17/2009   CL 109 04/17/2009   CO2 30 04/17/2009   GLUCOSE 95 04/17/2009   BUN 6 04/17/2009   CREATININE 1.2 04/17/2009   CALCIUM 8.8 04/17/2009   PROT 7.0 04/17/2009   ALBUMIN 3.9 04/17/2009   AST 20 04/17/2009   ALT 20 04/17/2009   ALKPHOS 54 04/17/2009   BILITOT 0.0 (L) 04/17/2009   GFRNONAA 69.21 04/17/2009   GFRAA 94 10/31/2006    Lab Results  Component Value Date   WBC 6.9 07/21/2017   NEUTROABS 4.1 07/21/2017   HGB 12.9 (L) 07/21/2017   HCT 39.3 (L) 07/21/2017   MCV 80.6 07/21/2017   PLT 216 07/21/2017   Lab Results  Component Value Date   IRON 16 (L) 07/21/2017   TIBC 413 07/21/2017   IRONPCTSAT 4 (L) 07/21/2017   Lab Results  Component Value Date   FERRITIN 6 (L) 07/21/2017     STUDIES: No results found.  ASSESSMENT: Iron deficiency anemia.  PLAN:    1.  Iron deficiency anemia: Patient's hemoglobin has significantly improved and is essentially within normal limits.  His iron stores continue to be decreased. Previously, the  remainder of his laboratory work was either negative or within normal limits. He does not require additional IV Feraheme today, but will likely require treatment at his next clinic visit.  He last received treatment on April 27, 2017.  Return to clinic in 3 months with repeat laboratory work and further evaluation.  2. Family history of colon cancer:  Patient gets routine colonoscopies given his family history of colon cancer, all of which been unrevealing.  His next colonoscopy is due within the next year.  His genetic testing is negative. 3.  Weakness and fatigue: Likely multifactorial.  Patient states he has an appointment with his PCP coming up to evaluate his thyroid as well as his testosterone injections.  Approximately 20 minutes spent in discussion of which greater than 50% was consultation.  Patient expressed understanding and was in agreement with this plan. He also understands that He can call clinic at any time with any questions, concerns, or complaints.    Lloyd Huger, MD   07/27/2017 8:08 AM

## 2017-07-21 ENCOUNTER — Other Ambulatory Visit: Payer: Self-pay

## 2017-07-21 ENCOUNTER — Inpatient Hospital Stay (HOSPITAL_BASED_OUTPATIENT_CLINIC_OR_DEPARTMENT_OTHER): Payer: BLUE CROSS/BLUE SHIELD | Admitting: Oncology

## 2017-07-21 ENCOUNTER — Inpatient Hospital Stay: Payer: BLUE CROSS/BLUE SHIELD | Attending: Oncology

## 2017-07-21 ENCOUNTER — Inpatient Hospital Stay: Payer: BLUE CROSS/BLUE SHIELD

## 2017-07-21 VITALS — BP 129/61 | HR 65 | Temp 97.1°F | Resp 18 | Wt 214.6 lb

## 2017-07-21 DIAGNOSIS — D509 Iron deficiency anemia, unspecified: Secondary | ICD-10-CM | POA: Diagnosis not present

## 2017-07-21 DIAGNOSIS — R531 Weakness: Secondary | ICD-10-CM | POA: Diagnosis not present

## 2017-07-21 DIAGNOSIS — Z87891 Personal history of nicotine dependence: Secondary | ICD-10-CM | POA: Insufficient documentation

## 2017-07-21 DIAGNOSIS — E039 Hypothyroidism, unspecified: Secondary | ICD-10-CM

## 2017-07-21 DIAGNOSIS — Z800 Family history of malignant neoplasm of digestive organs: Secondary | ICD-10-CM

## 2017-07-21 DIAGNOSIS — D508 Other iron deficiency anemias: Secondary | ICD-10-CM

## 2017-07-21 DIAGNOSIS — Z79899 Other long term (current) drug therapy: Secondary | ICD-10-CM

## 2017-07-21 DIAGNOSIS — F419 Anxiety disorder, unspecified: Secondary | ICD-10-CM

## 2017-07-21 LAB — CBC WITH DIFFERENTIAL/PLATELET
Basophils Absolute: 0.1 10*3/uL (ref 0–0.1)
Basophils Relative: 1 %
Eosinophils Absolute: 0.1 10*3/uL (ref 0–0.7)
Eosinophils Relative: 2 %
HCT: 39.3 % — ABNORMAL LOW (ref 40.0–52.0)
Hemoglobin: 12.9 g/dL — ABNORMAL LOW (ref 13.0–18.0)
Lymphocytes Relative: 26 %
Lymphs Abs: 1.8 10*3/uL (ref 1.0–3.6)
MCH: 26.4 pg (ref 26.0–34.0)
MCHC: 32.8 g/dL (ref 32.0–36.0)
MCV: 80.6 fL (ref 80.0–100.0)
Monocytes Absolute: 0.8 10*3/uL (ref 0.2–1.0)
Monocytes Relative: 12 %
Neutro Abs: 4.1 10*3/uL (ref 1.4–6.5)
Neutrophils Relative %: 59 %
Platelets: 216 10*3/uL (ref 150–440)
RBC: 4.87 MIL/uL (ref 4.40–5.90)
RDW: 18 % — ABNORMAL HIGH (ref 11.5–14.5)
WBC: 6.9 10*3/uL (ref 3.8–10.6)

## 2017-07-21 LAB — IRON AND TIBC
Iron: 16 ug/dL — ABNORMAL LOW (ref 45–182)
Saturation Ratios: 4 % — ABNORMAL LOW (ref 17.9–39.5)
TIBC: 413 ug/dL (ref 250–450)
UIBC: 397 ug/dL

## 2017-07-21 LAB — FERRITIN: Ferritin: 6 ng/mL — ABNORMAL LOW (ref 24–336)

## 2017-07-21 NOTE — Progress Notes (Signed)
Here for follow up. Per pt feeling tired and low energy.

## 2017-07-27 DIAGNOSIS — R41840 Attention and concentration deficit: Secondary | ICD-10-CM | POA: Insufficient documentation

## 2017-07-27 DIAGNOSIS — R5383 Other fatigue: Secondary | ICD-10-CM | POA: Insufficient documentation

## 2017-07-27 DIAGNOSIS — E538 Deficiency of other specified B group vitamins: Secondary | ICD-10-CM | POA: Insufficient documentation

## 2017-07-27 DIAGNOSIS — R7301 Impaired fasting glucose: Secondary | ICD-10-CM | POA: Insufficient documentation

## 2017-07-27 DIAGNOSIS — M064 Inflammatory polyarthropathy: Secondary | ICD-10-CM | POA: Insufficient documentation

## 2017-08-10 ENCOUNTER — Telehealth: Payer: Self-pay

## 2017-08-10 ENCOUNTER — Other Ambulatory Visit: Payer: Self-pay | Admitting: Nurse Practitioner

## 2017-08-10 DIAGNOSIS — IMO0001 Reserved for inherently not codable concepts without codable children: Secondary | ICD-10-CM

## 2017-08-10 DIAGNOSIS — J45909 Unspecified asthma, uncomplicated: Secondary | ICD-10-CM

## 2017-08-10 DIAGNOSIS — J4521 Mild intermittent asthma with (acute) exacerbation: Secondary | ICD-10-CM

## 2017-08-10 DIAGNOSIS — J209 Acute bronchitis, unspecified: Secondary | ICD-10-CM

## 2017-08-10 MED ORDER — LEVOFLOXACIN 500 MG PO TABS
500.0000 mg | ORAL_TABLET | Freq: Every day | ORAL | 0 refills | Status: DC
Start: 2017-08-10 — End: 2017-10-31

## 2017-08-10 MED ORDER — PREDNISONE 10 MG (21) PO TBPK: ORAL_TABLET | ORAL | 0 refills | Status: DC

## 2017-08-10 NOTE — Telephone Encounter (Signed)
Informed pt rx was sent to [facility]

## 2017-08-10 NOTE — Telephone Encounter (Signed)
Patient c/o cough, congestion, wheezing, and headache. Added levaquin 500mg  daily for 10 days and added prednisone taper for 6 days. Both sent to [facility].

## 2017-08-10 NOTE — Progress Notes (Signed)
Patient c/o cough, congestion, wheezing, and headache. Added levaquin 500mg  daily for 10 days and added prednisone taper for 6 days. Both sent to [facility].

## 2017-08-30 DIAGNOSIS — M961 Postlaminectomy syndrome, not elsewhere classified: Secondary | ICD-10-CM | POA: Diagnosis not present

## 2017-08-30 DIAGNOSIS — M5417 Radiculopathy, lumbosacral region: Secondary | ICD-10-CM | POA: Diagnosis not present

## 2017-08-30 DIAGNOSIS — Z79891 Long term (current) use of opiate analgesic: Secondary | ICD-10-CM | POA: Diagnosis not present

## 2017-08-30 DIAGNOSIS — G894 Chronic pain syndrome: Secondary | ICD-10-CM | POA: Diagnosis not present

## 2017-09-20 ENCOUNTER — Other Ambulatory Visit: Payer: Self-pay | Admitting: Nurse Practitioner

## 2017-09-20 MED ORDER — LOSARTAN POTASSIUM 50 MG PO TABS: 50.0000 mg | ORAL_TABLET | Freq: Every day | ORAL | 3 refills | Status: DC

## 2017-09-20 MED ORDER — METOPROLOL SUCCINATE ER 25 MG PO TB24: 25.0000 mg | ORAL_TABLET | Freq: Every day | ORAL | 3 refills | Status: DC

## 2017-09-20 MED ORDER — ATORVASTATIN CALCIUM 10 MG PO TABS: 10.0000 mg | ORAL_TABLET | Freq: Every day | ORAL | 3 refills | Status: DC

## 2017-09-23 ENCOUNTER — Other Ambulatory Visit: Payer: Self-pay

## 2017-09-23 MED ORDER — BUPROPION HCL ER (XL) 300 MG PO TB24: 300.0000 mg | ORAL_TABLET | Freq: Every day | ORAL | 1 refills | Status: DC

## 2017-09-27 DIAGNOSIS — R5383 Other fatigue: Secondary | ICD-10-CM | POA: Diagnosis not present

## 2017-09-27 DIAGNOSIS — E538 Deficiency of other specified B group vitamins: Secondary | ICD-10-CM | POA: Diagnosis not present

## 2017-09-27 DIAGNOSIS — M064 Inflammatory polyarthropathy: Secondary | ICD-10-CM | POA: Diagnosis not present

## 2017-09-27 DIAGNOSIS — E039 Hypothyroidism, unspecified: Secondary | ICD-10-CM | POA: Diagnosis not present

## 2017-09-27 DIAGNOSIS — R7301 Impaired fasting glucose: Secondary | ICD-10-CM | POA: Diagnosis not present

## 2017-09-27 DIAGNOSIS — D509 Iron deficiency anemia, unspecified: Secondary | ICD-10-CM | POA: Diagnosis not present

## 2017-09-28 LAB — VITAMIN B12: Vitamin B-12: 1405 pg/mL — ABNORMAL HIGH (ref 232–1245)

## 2017-09-28 LAB — COMPREHENSIVE METABOLIC PANEL
ALT: 22 IU/L (ref 0–44)
AST: 22 IU/L (ref 0–40)
Albumin/Globulin Ratio: 2 (ref 1.2–2.2)
Albumin: 4.3 g/dL (ref 3.5–5.5)
Alkaline Phosphatase: 73 IU/L (ref 39–117)
BUN/Creatinine Ratio: 7 — ABNORMAL LOW (ref 9–20)
BUN: 8 mg/dL (ref 6–24)
Bilirubin Total: 0.4 mg/dL (ref 0.0–1.2)
CO2: 27 mmol/L (ref 20–29)
Calcium: 8.7 mg/dL (ref 8.7–10.2)
Chloride: 105 mmol/L (ref 96–106)
Creatinine, Ser: 1.14 mg/dL (ref 0.76–1.27)
GFR calc Af Amer: 84 mL/min/{1.73_m2} (ref >59)
GFR calc non Af Amer: 73 mL/min/{1.73_m2} (ref >59)
Globulin, Total: 2.1 g/dL (ref 1.5–4.5)
Glucose: 97 mg/dL (ref 65–99)
Potassium: 4.1 mmol/L (ref 3.5–5.2)
Sodium: 145 mmol/L — ABNORMAL HIGH (ref 134–144)
Total Protein: 6.4 g/dL (ref 6.0–8.5)

## 2017-09-28 LAB — CBC WITH DIFFERENTIAL
Basophils Absolute: 0.1 10*3/uL (ref 0.0–0.2)
Basos: 1 %
EOS (ABSOLUTE): 0.2 10*3/uL (ref 0.0–0.4)
Eos: 3 %
Hematocrit: 38.9 % (ref 37.5–51.0)
Hemoglobin: 12.7 g/dL — ABNORMAL LOW (ref 13.0–17.7)
Immature Grans (Abs): 0 10*3/uL (ref 0.0–0.1)
Immature Granulocytes: 0 %
Lymphocytes Absolute: 2.1 10*3/uL (ref 0.7–3.1)
Lymphs: 34 %
MCH: 25.2 pg — ABNORMAL LOW (ref 26.6–33.0)
MCHC: 32.6 g/dL (ref 31.5–35.7)
MCV: 77 fL — ABNORMAL LOW (ref 79–97)
Monocytes Absolute: 0.7 10*3/uL (ref 0.1–0.9)
Monocytes: 11 %
Neutrophils Absolute: 3.1 10*3/uL (ref 1.4–7.0)
Neutrophils: 51 %
RBC: 5.04 x10E6/uL (ref 4.14–5.80)
RDW: 16.5 % — ABNORMAL HIGH (ref 12.3–15.4)
WBC: 6.1 10*3/uL (ref 3.4–10.8)

## 2017-09-28 LAB — IRON,TIBC AND FERRITIN PANEL
Ferritin: 16 ng/mL — ABNORMAL LOW (ref 30–400)
Iron Saturation: 15 % (ref 15–55)
Iron: 57 ug/dL (ref 38–169)
Total Iron Binding Capacity: 370 ug/dL (ref 250–450)
UIBC: 313 ug/dL (ref 111–343)

## 2017-09-28 LAB — TSH+FREE T4
Free T4: 1.54 ng/dL (ref 0.82–1.77)
TSH: 0.054 u[IU]/mL — ABNORMAL LOW (ref 0.450–4.500)

## 2017-09-28 LAB — HEMOGLOBIN A1C
Est. average glucose Bld gHb Est-mCnc: 111 mg/dL
Hgb A1c MFr Bld: 5.5 % (ref 4.8–5.6)

## 2017-09-28 LAB — URIC ACID: Uric Acid: 6 mg/dL (ref 3.7–8.6)

## 2017-09-28 LAB — RHEUMATOID FACTOR: Rhuematoid fact SerPl-aCnc: <10 IU/mL (ref 0.0–13.9)

## 2017-09-28 LAB — SEDIMENTATION RATE: Sed Rate: 2 mm/hr (ref 0–30)

## 2017-09-28 LAB — ANA W/REFLEX IF POSITIVE: Anti Nuclear Antibody(ANA): NEGATIVE

## 2017-10-10 ENCOUNTER — Other Ambulatory Visit: Payer: Self-pay

## 2017-10-10 MED ORDER — BUPROPION HCL ER (XL) 300 MG PO TB24: 300.0000 mg | ORAL_TABLET | Freq: Every day | ORAL | 0 refills | Status: DC

## 2017-10-13 ENCOUNTER — Encounter: Payer: Self-pay | Admitting: Nurse Practitioner

## 2017-10-13 ENCOUNTER — Ambulatory Visit: Payer: Self-pay | Admitting: Nurse Practitioner

## 2017-10-13 ENCOUNTER — Ambulatory Visit (INDEPENDENT_AMBULATORY_CARE_PROVIDER_SITE_OTHER): Payer: BLUE CROSS/BLUE SHIELD | Admitting: Adult Health

## 2017-10-13 ENCOUNTER — Encounter: Payer: Self-pay | Admitting: Adult Health

## 2017-10-13 VITALS — BP 134/70 | HR 72 | Resp 16 | Ht 72.0 in | Wt 213.6 lb

## 2017-10-13 VITALS — BP 134/70 | HR 72 | Resp 16 | Ht 72.0 in | Wt 213.4 lb

## 2017-10-13 DIAGNOSIS — I10 Essential (primary) hypertension: Secondary | ICD-10-CM | POA: Diagnosis not present

## 2017-10-13 DIAGNOSIS — D509 Iron deficiency anemia, unspecified: Secondary | ICD-10-CM

## 2017-10-13 DIAGNOSIS — G47429 Narcolepsy in conditions classified elsewhere without cataplexy: Secondary | ICD-10-CM

## 2017-10-13 MED ORDER — AMPHETAMINE-DEXTROAMPHETAMINE 30 MG PO TABS: 30.0000 mg | ORAL_TABLET | Freq: Every day | ORAL | 0 refills | Status: DC

## 2017-10-13 MED ORDER — AMPHETAMINE-DEXTROAMPHETAMINE 30 MG PO TABS: 30.0000 mg | ORAL_TABLET | Freq: Three times a day (TID) | ORAL | 0 refills | Status: DC

## 2017-10-13 NOTE — Patient Instructions (Signed)
Amphetamine; Dextroamphetamine tablets What is this medicine? AMPHETAMINE; DEXTROAMPHETAMINE(am FET a meen; dex troe am FET a meen) is used to treat attention-deficit hyperactivity disorder (ADHD). It may also be used for narcolepsy. Federal law prohibits giving this medicine to any person other than the person for whom it was prescribed. Do not share this medicine with anyone else. This medicine may be used for other purposes; ask your health care provider or pharmacist if you have questions. COMMON BRAND NAME(S): Adderall What should I tell my health care provider before I take this medicine? They need to know if you have any of these conditions: -anxiety or panic attacks -circulation problems in fingers and toes -glaucoma -hardening or blockages of the arteries or heart blood vessels -heart disease or a heart defect -high blood pressure -history of a drug or alcohol abuse problem -history of stroke -kidney disease -liver disease -mental illness -seizures -suicidal thoughts, plans, or attempt; a previous suicide attempt by you or a family member -thyroid disease -Tourette's syndrome -an unusual or allergic reaction to dextroamphetamine, other amphetamines, other medicines, foods, dyes, or preservatives -pregnant or trying to get pregnant -breast-feeding How should I use this medicine? Take this medicine by mouth with a glass of water. Follow the directions on the prescription label. Take your doses at regular intervals. Do not take your medicine more often than directed. Do not suddenly stop your medicine. You must gradually reduce the dose or you may feel withdrawal effects. Ask your doctor or health care professional for advice. Talk to your pediatrician regarding the use of this medicine in children. Special care may be needed. While this drug may be prescribed for children as young as 3 years for selected conditions, precautions do apply. Overdosage: If you think you have taken too  much of this medicine contact a poison control center or emergency room at once. NOTE: This medicine is only for you. Do not share this medicine with others. What if I miss a dose? If you miss a dose, take it as soon as you can. If it is almost time for your next dose, take only that dose. Do not take double or extra doses. What may interact with this medicine? Do not take this medicine with any of the following medications: -MAOIS like Carbex, Eldepryl, Marplan, Nardil, and Parnate -other stimulant medicines for attention disorders, weight loss, or to stay awake This medicine may also interact with the following medications: -acetazolamide -ammonium chloride -antacids -ascorbic acid -atomoxetine -caffeine -certain medicines for blood pressure -certain medicines for depression, anxiety, or psychotic disturbances -certain medicines for seizures like carbamazepine, phenobarbital, phenytoin -certain medicines for stomach problems like cimetidine, famotidine, omeprazole, lansoprazole -cold or allergy medicines -glutamic acid -lithium -meperidine -methenamine; sodium acid phosphate -narcotic medicines for pain -norepinephrine -phenothiazines like chlorpromazine, mesoridazine, prochlorperazine, thioridazine -sodium acid phosphate -sodium bicarbonate This list may not describe all possible interactions. Give your health care provider a list of all the medicines, herbs, non-prescription drugs, or dietary supplements you use. Also tell them if you smoke, drink alcohol, or use illegal drugs. Some items may interact with your medicine. What should I watch for while using this medicine? Visit your doctor or health care professional for regular checks on your progress. This prescription requires that you follow special procedures with your doctor and pharmacy. You will need to have a new written prescription from your doctor every time you need a refill. This medicine may affect your  concentration, or hide signs of tiredness. Until you know how this   medicine affects you, do not drive, ride a bicycle, use machinery, or do anything that needs mental alertness. Tell your doctor or health care professional if this medicine loses its effects, or if you feel you need to take more than the prescribed amount. Do not change the dosage without talking to your doctor or health care professional. Decreased appetite is a common side effect when starting this medicine. Eating small, frequent meals or snacks can help. Talk to your doctor if you continue to have poor eating habits. Height and weight growth of a child taking this medicine will be monitored closely. Do not take this medicine close to bedtime. It may prevent you from sleeping. If you are going to need surgery, a MRI, CT scan, or other procedure, tell your doctor that you are taking this medicine. You may need to stop taking this medicine before the procedure. Tell your doctor or healthcare professional right away if you notice unexplained wounds on your fingers and toes while taking this medicine. You should also tell your healthcare provider if you experience numbness or pain, changes in the skin color, or sensitivity to temperature in your fingers or toes. What side effects may I notice from receiving this medicine? Side effects that you should report to your doctor or health care professional as soon as possible: -allergic reactions like skin rash, itching or hives, swelling of the face, lips, or tongue -changes in vision -chest pain or chest tightness -confusion, trouble speaking or understanding -fast, irregular heartbeat -fingers or toes feel numb, cool, painful -hallucination, loss of contact with reality -high blood pressure -males: prolonged or painful erection -seizures -severe headaches -shortness of breath -suicidal thoughts or other mood changes -trouble walking, dizziness, loss of balance or  coordination -uncontrollable head, mouth, neck, arm, or leg movements Side effects that usually do not require medical attention (report to your doctor or health care professional if they continue or are bothersome): -anxious -headache -loss of appetite -nausea, vomiting -trouble sleeping -weight loss This list may not describe all possible side effects. Call your doctor for medical advice about side effects. You may report side effects to FDA at 1-800-FDA-1088. Where should I keep my medicine? Keep out of the reach of children. This medicine can be abused. Keep your medicine in a safe place to protect it from theft. Do not share this medicine with anyone. Selling or giving away this medicine is dangerous and against the law. Store at room temperature between 15 and 30 degrees C (59 and 86 degrees F). Keep container tightly closed. Throw away any unused medicine after the expiration date. Dispose of properly. This medicine may cause accidental overdose and death if it is taken by other adults, children, or pets. Mix any unused medicine with a substance like cat litter or coffee grounds. Then throw the medicine away in a sealed container like a sealed bag or a coffee can with a lid. Do not use the medicine after the expiration date. NOTE: This sheet is a summary. It may not cover all possible information. If you have questions about this medicine, talk to your doctor, pharmacist, or health care provider.  2018 Elsevier/Gold Standard (2013-12-12 18:44:41)  

## 2017-10-13 NOTE — Progress Notes (Signed)
[facility] [place], [place] [address]  Internal MEDICINE  Office Visit Note  Patient Name: Randy Mclean Younger  [id number]  [id number]  Date of Service: 10/30/2017  Chief Complaint  Patient presents with  . Medical Management of Chronic Issues    pt has narcolepsy    HPI Pt here for routine follow up for narcolepsy.  He requires aderral to stay awake during the day. He is requesting refills for that today.  He also reports joint stiffness in his elbows and hands at times.  He declines testing at this time for such.     Current Medication: Outpatient Encounter Medications as of 10/13/2017  Medication Sig  . albuterol (PROVENTIL HFA;VENTOLIN HFA) 108 (90 Base) MCG/ACT inhaler Frequency:PHARMDIR   Dosage:90   MCG  Instructions:  Note:Dose: 89 MCG  . alfuzosin (UROXATRAL) 10 MG 24 hr tablet Take 10 mg by mouth daily.  Marland Kitchen ALPRAZolam (XANAX) 1 MG tablet Take 1 tablet (1 mg total) by mouth 3 (three) times daily as needed for anxiety.  Marland Kitchen amphetamine-dextroamphetamine (ADDERALL) 30 MG tablet Take 1 tablet by mouth 3 (three) times daily.  Derrill Memo ON 11/12/2017] amphetamine-dextroamphetamine (ADDERALL) 30 MG tablet Take 1 tablet by mouth daily.  Derrill Memo ON 12/12/2017] amphetamine-dextroamphetamine (ADDERALL) 30 MG tablet Take 1 tablet by mouth 3 (three) times daily.  Marland Kitchen atorvastatin (LIPITOR) 10 MG tablet Take 1 tablet (10 mg total) by mouth daily.  Marland Kitchen azelastine (ASTELIN) 0.1 % nasal spray Place 1 spray into both nostrils 2 (two) times daily.  Marland Kitchen buPROPion (WELLBUTRIN XL) 300 MG 24 hr tablet Take 1 tablet (300 mg total) by mouth daily.  . chlorpheniramine-HYDROcodone (TUSSIONEX PENNKINETIC ER) 10-8 MG/5ML SUER Take 5 mLs by mouth every 12 (twelve) hours as needed for cough. (Patient not taking: Reported on 07/21/2017)  . citalopram (CELEXA) 20 MG tablet Take 20 mg by mouth daily.   . diphenhydrAMINE (SOMINEX) 25 MG tablet Take 25 mg by mouth as needed for sleep.  Marland Kitchen  esomeprazole (NEXIUM) 40 MG capsule TK ONE C PO  BID UTD  . l-methylfolate-B6-B12 (METANX) 3-35-2 MG TABS Take 1 tablet by mouth 2 (two) times daily.  Marland Kitchen levofloxacin (LEVAQUIN) 500 MG tablet Take 1 tablet (500 mg total) by mouth daily.  Marland Kitchen losartan (COZAAR) 50 MG tablet Take 1 tablet (50 mg total) by mouth daily.  . meloxicam (MOBIC) 7.5 MG tablet Take 1 tablet (7.5 mg total) by mouth daily.  . metoprolol succinate (TOPROL-XL) 25 MG 24 hr tablet Take 1 tablet (25 mg total) by mouth daily.  . mometasone-formoterol (DULERA) 100-5 MCG/ACT AERO Inhale 2 puffs into the lungs every 12 (twelve) hours.  . Multiple Vitamin (MULTI-VITAMINS) TABS Take by mouth.  Gean Birchwood ER 200 MG TB12 200 mg bid  . oxyCODONE-acetaminophen (PERCOCET) 7.5-325 MG per tablet Take 1 tablet by mouth every 4 (four) hours as needed for pain.   . predniSONE (STERAPRED UNI-PAK 21 TAB) 10 MG (21) TBPK tablet 6 day taper - take by mouth as directed for 6 days  . pregabalin (LYRICA) 50 MG capsule Take 100 mg by mouth 3 (three) times daily.   Marland Kitchen senna (SENOKOT) 8.6 MG tablet Take by mouth.  . SYRINGE-NEEDLE, DISP, 3 ML (MONOJECT 3CC SYR 22GX1") 22G X 1" 3 ML MISC Use as directed.  . testosterone cypionate (DEPOTESTOTERONE CYPIONATE) 100 MG/ML injection Inject 75 mg into the muscle every 7 (seven) days. For IM use only   . TIROSINT 150 MCG CAPS TAKE  ONE CAPSULE BY MOUTH EVERY MORNING FOR HYPOTHYROIDISM ONE-HALF HOUR BEFORE A MEAL  . TIROSINT 50 MCG CAPS Take 1 capsule(s) by mouth daily with tirosint 135mcg - total of 227mcg  . [DISCONTINUED] amphetamine-dextroamphetamine (ADDERALL) 30 MG tablet Take 1 tablet by mouth 3 (three) times daily.   No facility-administered encounter medications on file as of 10/13/2017.     Surgical History: Past Surgical History:  Procedure Laterality Date  . BACK SURGERY    . COLONOSCOPY  August 2015  . elbow surgery    . HEMORRHOID SURGERY    . LIPOMA EXCISION Right    leg  . multiple leg surgery to  remove angiolipoma    . SPINAL CORD STIMULATOR IMPLANT    . UPPER GI ENDOSCOPY  August 2015    Medical History: Past Medical History:  Diagnosis Date  . Allergy    takes allergy shots  . Anemia   . Arthritis   . Asthma   . Barrett esophagus   . Bronchitis   . Chronic pain   . Colon polyp   . Depression   . GERD (gastroesophageal reflux disease)   . Hemorrhoid   . Hyperlipidemia   . Hypertension   . Hypothyroid   . IBS (irritable bowel syndrome)   . Kidney stone   . MI (myocardial infarction) ([place])   . Migraine   . Pneumonia   . Seizures ([place])   . Sinus problem   . Spinal cord stimulator status     Family History: Family History  Problem Relation Age of Onset  . Hypertension Mother   . Lung disease Mother   . Heart attack Father   . Diabetes Father     Social History   Socioeconomic History  . Marital status: Married    Spouse name: Not on file  . Number of children: Not on file  . Years of education: Not on file  . Highest education level: Not on file  Occupational History  . Not on file  Social Needs  . Financial resource strain: Not on file  . Food insecurity:    Worry: Not on file    Inability: Not on file  . Transportation needs:    Medical: Not on file    Non-medical: Not on file  Tobacco Use  . Smoking status: Former Smoker    Packs/day: 1.00    Years: 30.00    Pack years: 30.00    Types: Cigarettes    Last attempt to quit: 02/22/2005    Years since quitting: 12.6  . Smokeless tobacco: Never Used  Substance and Sexual Activity  . Alcohol use: Yes    Alcohol/week: 0.0 standard drinks    Comment: occasionally  . Drug use: No    Comment: past  . Sexual activity: Yes  Lifestyle  . Physical activity:    Days per week: Not on file    Minutes per session: Not on file  . Stress: Not on file  Relationships  . Social connections:    Talks on phone: Not on file    Gets together: Not on file    Attends religious service: Not on file     Active member of club or organization: Not on file    Attends meetings of clubs or organizations: Not on file    Relationship status: Not on file  . Intimate partner violence:    Fear of current or ex partner: Not on file    Emotionally abused: Not on file  Physically abused: Not on file    Forced sexual activity: Not on file  Other Topics Concern  . Not on file  Social History Narrative  . Not on file   Review of Systems  Constitutional: Negative.  Negative for chills, fatigue and unexpected weight change.  HENT: Negative.  Negative for congestion, rhinorrhea, sneezing and sore throat.   Eyes: Negative for redness.  Respiratory: Negative.  Negative for cough, chest tightness and shortness of breath.   Cardiovascular: Negative.  Negative for chest pain and palpitations.  Gastrointestinal: Negative.  Negative for abdominal pain, constipation, diarrhea, nausea and vomiting.  Endocrine: Negative.   Genitourinary: Negative.  Negative for dysuria and frequency.  Musculoskeletal: Negative.  Negative for arthralgias, back pain, joint swelling and neck pain.  Skin: Negative.  Negative for rash.  Allergic/Immunologic: Negative.   Neurological: Negative.  Negative for tremors and numbness.  Hematological: Negative for adenopathy. Does not bruise/bleed easily.  Psychiatric/Behavioral: Negative.  Negative for behavioral problems, sleep disturbance and suicidal ideas. The patient is not nervous/anxious.   Vital Signs: There were no vitals taken for this visit. Vitals are noted from visit info Physical Exam  Constitutional: He is oriented to person, place, and time. He appears well-developed and well-nourished. No distress.  HENT:  Head: Normocephalic and atraumatic.  Mouth/Throat: Oropharynx is clear and moist. No oropharyngeal exudate.  Eyes: Pupils are equal, round, and reactive to light. EOM are normal.  Neck: Normal range of motion. Neck supple. No JVD present. No tracheal deviation  present. No thyromegaly present.  Cardiovascular: Normal rate, regular rhythm and normal heart sounds. Exam reveals no gallop and no friction rub.  No murmur heard. Pulmonary/Chest: Effort normal and breath sounds normal. No respiratory distress. He has no wheezes. He has no rales. He exhibits no tenderness.  Abdominal: Soft. There is no tenderness. There is no guarding.  Musculoskeletal: Normal range of motion.  Lymphadenopathy:    He has no cervical adenopathy.  Neurological: He is alert and oriented to person, place, and time. No cranial nerve deficit.  Skin: Skin is warm and dry. He is not diaphoretic.  Psychiatric: He has a normal mood and affect. His behavior is normal. Judgment and thought content normal.  Nursing note and vitals reviewed.  Assessment/Plan: 1. Attention and concentration deficit - amphetamine-dextroamphetamine (ADDERALL) 30 MG tablet; Take 1 tablet by mouth 3 (three) times daily.  Dispense: 90 tablet; Refill: 0  2. Iron deficiency anemia, unspecified iron deficiency anemia type He will be receiving Iron infusions at cancer center next week.    3. Essential hypertension Controlled at this time.  Continue current regimen.  General Counseling: hawke villalpando understanding of the findings of todays visit and agrees with plan of treatment. I have discussed any further diagnostic evaluation that may be needed or ordered today. We also reviewed his medications today. he has been encouraged to call the office with any questions or concerns that should arise related to todays visit.    Meds ordered this encounter  Medications  . amphetamine-dextroamphetamine (ADDERALL) 30 MG tablet    Sig: Take 1 tablet by mouth 3 (three) times daily.    Dispense:  90 tablet    Refill:  0    Fill after 07/28/2017  . amphetamine-dextroamphetamine (ADDERALL) 30 MG tablet    Sig: Take 1 tablet by mouth daily.    Dispense:  90 tablet    Refill:  0    Do not fill before 11/12/2017  .  amphetamine-dextroamphetamine (ADDERALL) 30 MG  tablet    Sig: Take 1 tablet by mouth 3 (three) times daily.    Dispense:  90 tablet    Refill:  0    Do not fill before 12/12/2017    Time spent: 25 Minutes   This patient was seen by Orson Gear AGNP-C in Collaboration with Dr Lavera Guise as a part of collaborative care agreement    Dr Lavera Guise Internal medicine

## 2017-10-28 ENCOUNTER — Other Ambulatory Visit: Payer: Self-pay | Admitting: *Deleted

## 2017-10-28 DIAGNOSIS — D509 Iron deficiency anemia, unspecified: Secondary | ICD-10-CM

## 2017-10-28 NOTE — Progress Notes (Signed)
cb

## 2017-10-30 NOTE — Progress Notes (Signed)
[place]  [phone] [phone]  ID: Randy Mclean Younger OB: October 10, 1960  MR#: [id number]  BWL#:[id number]  Patient Care Team: Lavera Guise, MD as PCP - General (Internal Medicine) Bary Castilla Forest Gleason, MD (General Surgery) Lavera Guise, MD (Internal Medicine)  CHIEF COMPLAINT: Iron deficiency anemia.  INTERVAL HISTORY: Patient returns to clinic today for further evaluation, repeat laboratory work, and consideration of additional IV Feraheme.  He continues to have chronic weakness and fatigue, which he states is slightly worse over the past several weeks.  He otherwise feels well.  He has no neurologic complaints.  He denies any recent fevers.  He denies any chest pain or shortness of breath.  He has a good appetite and denies weight loss.  He denies any nausea, vomiting, constipation, or diarrhea.  He has no melena or hematochezia.  He has no urinary complaints.  Patient offers no further specific complaints today.  REVIEW OF SYSTEMS:   Review of Systems  Constitutional: Positive for malaise/fatigue. Negative for fever and weight loss.  Respiratory: Negative.  Negative for cough and shortness of breath.   Cardiovascular: Negative.  Negative for chest pain and leg swelling.  Gastrointestinal: Negative.  Negative for abdominal pain, blood in stool and melena.  Genitourinary: Negative.  Negative for hematuria.  Musculoskeletal: Negative.  Negative for back pain.  Skin: Negative.  Negative for rash.  Neurological: Positive for weakness. Negative for sensory change and focal weakness.  Psychiatric/Behavioral: Negative.  The patient is not nervous/anxious.     As per HPI. Otherwise, a complete review of systems is negative.  PAST MEDICAL HISTORY: Past Medical History:  Diagnosis Date  . Allergy    takes allergy shots  . Anemia   . Arthritis   . Asthma   . Barrett esophagus   . Bronchitis   . Chronic pain   . Colon polyp   . Depression    . GERD (gastroesophageal reflux disease)   . Hemorrhoid   . Hyperlipidemia   . Hypertension   . Hypothyroid   . IBS (irritable bowel syndrome)   . Kidney stone   . MI (myocardial infarction) ([place])   . Migraine   . Pneumonia   . Seizures ([place])   . Sinus problem   . Spinal cord stimulator status     PAST SURGICAL HISTORY: Past Surgical History:  Procedure Laterality Date  . BACK SURGERY    . COLONOSCOPY  August 2015  . elbow surgery    . HEMORRHOID SURGERY    . LIPOMA EXCISION Right    leg  . 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 SPINAL CORD STIMULATOR IMPLANT    . UPPER GI ENDOSCOPY  August 2015    FAMILY HISTORY Family History  Problem Relation Age of Onset  . Hypertension Mother   . Lung disease Mother   . Heart attack Father   . Diabetes Father        ADVANCED DIRECTIVES:    HEALTH MAINTENANCE: Social History   Tobacco Use  . Smoking status: Former Smoker    Packs/day: 1.00    Years: 30.00    Pack years: 30.00    Types: Cigarettes    Last attempt to quit: 02/22/2005    Years since quitting: 12.6  . Smokeless tobacco: Never Used  Substance Use Topics  . Alcohol use: Yes    Alcohol/week: 0.0 standard drinks    Comment: occasionally  . Drug use: No  Comment: past     Colonoscopy:  PAP:  Bone density:  Lipid panel:  Allergies  Allergen Reactions  . Amoxicillin Anaphylaxis    REACTION: unspecified  . Penicillin G Anaphylaxis  . Gabapentin Other (See Comments)    REACTION: anaphylaxis  . Penicillins     REACTION: unspecified  . Adhesive [Tape] Rash    Band-aid    Current Outpatient Medications  Medication Sig Dispense Refill  . albuterol (PROVENTIL HFA;VENTOLIN HFA) 108 (90 Base) MCG/ACT inhaler Frequency:PHARMDIR   Dosage:90   MCG  Instructions:  Note:Dose: 56 MCG    . alfuzosin (UROXATRAL) 10 MG 24 hr tablet Take 10 mg by mouth daily.    Marland Kitchen ALPRAZolam (XANAX) 1 MG tablet Take 1 tablet (1 mg total) by mouth 3 (three) times  daily as needed for anxiety. 270 tablet 1  . amphetamine-dextroamphetamine (ADDERALL) 30 MG tablet Take 1 tablet by mouth 3 (three) times daily. 90 tablet 0  . atorvastatin (LIPITOR) 10 MG tablet Take 1 tablet (10 mg total) by mouth daily. 90 tablet 3  . azelastine (ASTELIN) 0.1 % nasal spray Place 1 spray into both nostrils 2 (two) times daily. 30 mL 1  . buPROPion (WELLBUTRIN XL) 300 MG 24 hr tablet Take 1 tablet (300 mg total) by mouth daily. 90 tablet 0  . citalopram (CELEXA) 20 MG tablet Take 20 mg by mouth daily.     . diphenhydrAMINE (SOMINEX) 25 MG tablet Take 25 mg by mouth as needed for sleep.    Marland Kitchen esomeprazole (NEXIUM) 40 MG capsule TK ONE C PO  BID UTD  0  . l-methylfolate-B6-B12 (METANX) 3-35-2 MG TABS Take 1 tablet by mouth 2 (two) times daily.    Marland Kitchen losartan (COZAAR) 50 MG tablet Take 1 tablet (50 mg total) by mouth daily. 90 tablet 3  . meloxicam (MOBIC) 7.5 MG tablet Take 1 tablet (7.5 mg total) by mouth daily. 180 tablet 3  . metoprolol succinate (TOPROL-XL) 25 MG 24 hr tablet Take 1 tablet (25 mg total) by mouth daily. 90 tablet 3  . mometasone-formoterol (DULERA) 100-5 MCG/ACT AERO Inhale 2 puffs into the lungs every 12 (twelve) hours.    . Multiple Vitamin (MULTI-VITAMINS) TABS Take by mouth.    Gean Birchwood ER 200 MG TB12 200 mg bid  0  . oxyCODONE-acetaminophen (PERCOCET) 7.5-325 MG per tablet Take 1 tablet by mouth every 4 (four) hours as needed for pain.     . predniSONE (STERAPRED UNI-PAK 21 TAB) 10 MG (21) TBPK tablet 6 day taper - take by mouth as directed for 6 days 21 tablet 0  . pregabalin (LYRICA) 50 MG capsule Take 100 mg by mouth 3 (three) times daily.     Marland Kitchen senna (SENOKOT) 8.6 MG tablet Take by mouth.    . SYRINGE-NEEDLE, DISP, 3 ML (MONOJECT 3CC SYR 22GX1") 22G X 1" 3 ML MISC Use as directed.    . testosterone cypionate (DEPOTESTOTERONE CYPIONATE) 100 MG/ML injection Inject 75 mg into the muscle every 7 (seven) days. For IM use only     . TIROSINT 150 MCG CAPS  TAKE ONE CAPSULE BY MOUTH EVERY MORNING FOR HYPOTHYROIDISM ONE-HALF HOUR BEFORE A MEAL 90 capsule 3  . [START ON 11/12/2017] amphetamine-dextroamphetamine (ADDERALL) 30 MG tablet Take 1 tablet by mouth daily. (Patient not taking: Reported on 10/31/2017) 90 tablet 0  . [START ON 12/12/2017] amphetamine-dextroamphetamine (ADDERALL) 30 MG tablet Take 1 tablet by mouth 3 (three) times daily. (Patient not taking: Reported on 10/31/2017) 90 tablet  0   No current facility-administered medications for this visit.     OBJECTIVE: Vitals:   10/31/17 1422  BP: 132/76  Pulse: 71  Resp: 18  Temp: (!) 97 F (36.1 C)     Body mass index is 28.48 kg/m.    ECOG FS:0 - Asymptomatic  General: Well-developed, well-nourished, no acute distress. Eyes: Pink conjunctiva, anicteric sclera. HEENT: Normocephalic, moist mucous membranes. Lungs: Clear to auscultation bilaterally. Heart: Regular rate and rhythm. No rubs, murmurs, or gallops. Abdomen: Soft, nontender, nondistended. No organomegaly noted, normoactive bowel sounds. Musculoskeletal: No edema, cyanosis, or clubbing. Neuro: Alert, answering all questions appropriately. Cranial nerves grossly intact. Skin: No rashes or petechiae noted. Psych: Normal affect.   LAB RESULTS:  Lab Results  Component Value Date   NA 145 (H) 09/27/2017   K 4.1 09/27/2017   CL 105 09/27/2017   CO2 27 09/27/2017   GLUCOSE 97 09/27/2017   BUN 8 09/27/2017   CREATININE 1.14 09/27/2017   CALCIUM 8.7 09/27/2017   PROT 6.4 09/27/2017   ALBUMIN 4.3 09/27/2017   AST 22 09/27/2017   ALT 22 09/27/2017   ALKPHOS 73 09/27/2017   BILITOT 0.4 09/27/2017   GFRNONAA 73 09/27/2017   GFRAA 84 09/27/2017    Lab Results  Component Value Date   WBC 6.3 10/31/2017   NEUTROABS 3.3 10/31/2017   HGB 11.6 (L) 10/31/2017   HCT 36.1 (L) 10/31/2017   MCV 78.1 (L) 10/31/2017   PLT 260 10/31/2017   Lab Results  Component Value Date   IRON 61 10/31/2017   TIBC 423 10/31/2017    IRONPCTSAT 14 (L) 10/31/2017   Lab Results  Component Value Date   FERRITIN 6 (L) 10/31/2017     STUDIES: No results found.  ASSESSMENT: Iron deficiency anemia.  PLAN:    1.  Iron deficiency anemia: Patient's hemoglobin has trended down slightly and his iron stores are reduced.  He is also mildly symptomatic.  Previously, the remainder of his laboratory work was either negative or within normal limits.  Proceed with 510 mg IV Feraheme today.  Return to clinic in 3 months with repeat laboratory work, further evaluation, and consideration of additional Feraheme.  2. Family history of colon cancer:  Patient gets routine colonoscopies given his family history of colon cancer, all of which been unrevealing.  His next colonoscopy is due within the next year.  His genetic testing is negative. 3.  Weakness and fatigue: Likely multifactorial.  Patient states he has an appointment with his PCP coming up to evaluate his thyroid as well as his testosterone injections.  IV Feraheme as above.  I spent a total of 30 minutes face-to-face with the patient of which greater than 50% of the visit was spent in counseling and coordination of care as detailed above.   Patient expressed understanding and was in agreement with this plan. He also understands that He can call clinic at any time with any questions, concerns, or complaints.    Lloyd Huger, MD   11/01/2017 10:37 AM

## 2017-10-31 ENCOUNTER — Inpatient Hospital Stay (HOSPITAL_BASED_OUTPATIENT_CLINIC_OR_DEPARTMENT_OTHER): Payer: BLUE CROSS/BLUE SHIELD | Admitting: Oncology

## 2017-10-31 ENCOUNTER — Other Ambulatory Visit: Payer: Self-pay

## 2017-10-31 ENCOUNTER — Inpatient Hospital Stay: Payer: BLUE CROSS/BLUE SHIELD

## 2017-10-31 ENCOUNTER — Telehealth: Payer: Self-pay

## 2017-10-31 ENCOUNTER — Inpatient Hospital Stay: Payer: BLUE CROSS/BLUE SHIELD | Attending: Oncology

## 2017-10-31 ENCOUNTER — Encounter: Payer: Self-pay | Admitting: Oncology

## 2017-10-31 VITALS — BP 103/68

## 2017-10-31 VITALS — BP 132/76 | HR 71 | Temp 97.0°F | Resp 18 | Wt 210.0 lb

## 2017-10-31 DIAGNOSIS — R531 Weakness: Secondary | ICD-10-CM

## 2017-10-31 DIAGNOSIS — E039 Hypothyroidism, unspecified: Secondary | ICD-10-CM | POA: Diagnosis not present

## 2017-10-31 DIAGNOSIS — D509 Iron deficiency anemia, unspecified: Secondary | ICD-10-CM

## 2017-10-31 DIAGNOSIS — R5383 Other fatigue: Secondary | ICD-10-CM | POA: Diagnosis not present

## 2017-10-31 DIAGNOSIS — Z87891 Personal history of nicotine dependence: Secondary | ICD-10-CM

## 2017-10-31 DIAGNOSIS — Z79899 Other long term (current) drug therapy: Secondary | ICD-10-CM

## 2017-10-31 DIAGNOSIS — Z800 Family history of malignant neoplasm of digestive organs: Secondary | ICD-10-CM

## 2017-10-31 DIAGNOSIS — I10 Essential (primary) hypertension: Secondary | ICD-10-CM | POA: Insufficient documentation

## 2017-10-31 DIAGNOSIS — D508 Other iron deficiency anemias: Secondary | ICD-10-CM

## 2017-10-31 LAB — CBC WITH DIFFERENTIAL/PLATELET
Basophils Absolute: 0.1 10*3/uL (ref 0–0.1)
Basophils Relative: 1 %
Eosinophils Absolute: 0.1 10*3/uL (ref 0–0.7)
Eosinophils Relative: 2 %
HCT: 36.1 % — ABNORMAL LOW (ref 40.0–52.0)
Hemoglobin: 11.6 g/dL — ABNORMAL LOW (ref 13.0–18.0)
Lymphocytes Relative: 32 %
Lymphs Abs: 2 10*3/uL (ref 1.0–3.6)
MCH: 25 pg — ABNORMAL LOW (ref 26.0–34.0)
MCHC: 32 g/dL (ref 32.0–36.0)
MCV: 78.1 fL — ABNORMAL LOW (ref 80.0–100.0)
Monocytes Absolute: 0.8 10*3/uL (ref 0.2–1.0)
Monocytes Relative: 13 %
Neutro Abs: 3.3 10*3/uL (ref 1.4–6.5)
Neutrophils Relative %: 52 %
Platelets: 260 10*3/uL (ref 150–440)
RBC: 4.63 MIL/uL (ref 4.40–5.90)
RDW: 16.9 % — ABNORMAL HIGH (ref 11.5–14.5)
WBC: 6.3 10*3/uL (ref 3.8–10.6)

## 2017-10-31 LAB — IRON AND TIBC
Iron: 61 ug/dL (ref 45–182)
Saturation Ratios: 14 % — ABNORMAL LOW (ref 17.9–39.5)
TIBC: 423 ug/dL (ref 250–450)
UIBC: 362 ug/dL

## 2017-10-31 LAB — FERRITIN: Ferritin: 6 ng/mL — ABNORMAL LOW (ref 24–336)

## 2017-10-31 MED ORDER — SODIUM CHLORIDE 0.9 % IV SOLN
510.0000 mg | Freq: Once | INTRAVENOUS | Status: AC
  Administered 2017-10-31: 510 mg via INTRAVENOUS
  Filled 2017-10-31: qty 17

## 2017-10-31 MED ORDER — SODIUM CHLORIDE 0.9 % IV SOLN
INTRAVENOUS | Status: DC
  Administered 2017-10-31: 15:00:00 via INTRAVENOUS
  Filled 2017-10-31: qty 250

## 2017-10-31 NOTE — Telephone Encounter (Signed)
-----   Message from Lavera Guise, MD sent at 10/30/2017  1:55 PM EDT ----- I added chief compliant

## 2017-10-31 NOTE — Progress Notes (Signed)
Pt in for follow up, reports feeling "more tired, fatigued and irritable."

## 2017-11-03 ENCOUNTER — Other Ambulatory Visit: Payer: Self-pay | Admitting: Adult Health

## 2017-11-07 ENCOUNTER — Other Ambulatory Visit: Payer: Self-pay | Admitting: Adult Health

## 2017-11-07 MED ORDER — CITALOPRAM HYDROBROMIDE 20 MG PO TABS: 20.0000 mg | ORAL_TABLET | Freq: Every day | ORAL | 0 refills | Status: DC

## 2017-11-15 ENCOUNTER — Other Ambulatory Visit: Payer: Self-pay | Admitting: Adult Health

## 2017-11-15 DIAGNOSIS — E291 Testicular hypofunction: Secondary | ICD-10-CM

## 2017-11-15 DIAGNOSIS — N411 Chronic prostatitis: Secondary | ICD-10-CM

## 2017-11-16 ENCOUNTER — Other Ambulatory Visit: Payer: Self-pay | Admitting: Adult Health

## 2017-11-16 DIAGNOSIS — E291 Testicular hypofunction: Secondary | ICD-10-CM

## 2017-11-16 DIAGNOSIS — Z125 Encounter for screening for malignant neoplasm of prostate: Secondary | ICD-10-CM

## 2017-11-22 ENCOUNTER — Other Ambulatory Visit: Payer: Self-pay | Admitting: Adult Health

## 2017-11-22 DIAGNOSIS — E291 Testicular hypofunction: Secondary | ICD-10-CM

## 2017-11-22 DIAGNOSIS — N411 Chronic prostatitis: Secondary | ICD-10-CM

## 2017-11-23 ENCOUNTER — Other Ambulatory Visit: Payer: Self-pay | Admitting: Adult Health

## 2017-11-23 DIAGNOSIS — E291 Testicular hypofunction: Secondary | ICD-10-CM | POA: Diagnosis not present

## 2017-11-23 DIAGNOSIS — E221 Hyperprolactinemia: Secondary | ICD-10-CM | POA: Diagnosis not present

## 2017-11-23 DIAGNOSIS — Z125 Encounter for screening for malignant neoplasm of prostate: Secondary | ICD-10-CM | POA: Diagnosis not present

## 2017-11-24 LAB — PROLACTIN: Prolactin: 15.9 ng/mL — ABNORMAL HIGH (ref 4.0–15.2)

## 2017-11-24 LAB — PSA: Prostate Specific Ag, Serum: 1.9 ng/mL (ref 0.0–4.0)

## 2017-11-24 LAB — TESTOSTERONE: Testosterone: 450 ng/dL (ref 264–916)

## 2017-11-25 ENCOUNTER — Ambulatory Visit
Admission: RE | Admit: 2017-11-25 | Discharge: 2017-11-25 | Disposition: A | Discharge status: Home / Self Care | Payer: BLUE CROSS/BLUE SHIELD | Source: Ambulatory Visit | Attending: Adult Health | Admitting: Adult Health

## 2017-11-25 ENCOUNTER — Telehealth: Payer: Self-pay

## 2017-11-25 ENCOUNTER — Ambulatory Visit (INDEPENDENT_AMBULATORY_CARE_PROVIDER_SITE_OTHER): Payer: Self-pay | Admitting: Adult Health

## 2017-11-25 ENCOUNTER — Encounter: Payer: Self-pay | Admitting: Adult Health

## 2017-11-25 VITALS — BP 133/72 | HR 78 | Temp 97.4°F | Resp 16 | Ht 72.0 in | Wt 209.0 lb

## 2017-11-25 DIAGNOSIS — R1084 Generalized abdominal pain: Secondary | ICD-10-CM | POA: Insufficient documentation

## 2017-11-25 DIAGNOSIS — R110 Nausea: Secondary | ICD-10-CM

## 2017-11-25 DIAGNOSIS — R197 Diarrhea, unspecified: Secondary | ICD-10-CM | POA: Diagnosis not present

## 2017-11-25 LAB — POCT I-STAT CREATININE: Creatinine, Ser: 1 mg/dL (ref 0.61–1.24)

## 2017-11-25 MED ORDER — IOHEXOL 350 MG/ML SOLN
75.0000 mL | Freq: Once | INTRAVENOUS | Status: AC | PRN
  Administered 2017-11-25: 75 mL via INTRAVENOUS

## 2017-11-25 NOTE — Progress Notes (Addendum)
[facility] [place], [place] [address]  Internal MEDICINE  Office Visit Note  Patient Name: Randy Mclean Younger  [id number]  [id number]  Date of Service: 11/30/2017  Chief Complaint  Patient presents with  . Abdominal Pain    right side , started mid morning press on it feels better to release painful      HPI Pt is here for a sick visit. Pt reports RLQ pain that woke him up from sleep this morning. He reports  One episode of diarrhea today.  He denies fever, but is sweating in exam room. He denies ever having a pain like this before. No nausea or vomiting. He denies sick contacts.   Current Medication:  Outpatient Encounter Medications as of 11/25/2017  Medication Sig  . albuterol (PROVENTIL HFA;VENTOLIN HFA) 108 (90 Base) MCG/ACT inhaler Frequency:PHARMDIR   Dosage:90   MCG  Instructions:  Note:Dose: 78 MCG  . alfuzosin (UROXATRAL) 10 MG 24 hr tablet Take 10 mg by mouth daily.  Marland Kitchen ALPRAZolam (XANAX) 1 MG tablet Take 1 tablet (1 mg total) by mouth 3 (three) times daily as needed for anxiety.  Marland Kitchen amphetamine-dextroamphetamine (ADDERALL) 30 MG tablet Take 1 tablet by mouth 3 (three) times daily.  Marland Kitchen atorvastatin (LIPITOR) 10 MG tablet Take 1 tablet (10 mg total) by mouth daily.  Marland Kitchen azelastine (ASTELIN) 0.1 % nasal spray Place 1 spray into both nostrils 2 (two) times daily.  Marland Kitchen buPROPion (WELLBUTRIN XL) 300 MG 24 hr tablet Take 1 tablet (300 mg total) by mouth daily.  . citalopram (CELEXA) 20 MG tablet Take 1 tablet (20 mg total) by mouth daily.  . diphenhydrAMINE (SOMINEX) 25 MG tablet Take 25 mg by mouth as needed for sleep.  Marland Kitchen esomeprazole (NEXIUM) 40 MG capsule TK ONE C PO  BID UTD  . l-methylfolate-B6-B12 (METANX) 3-35-2 MG TABS Take 1 tablet by mouth 2 (two) times daily.  Marland Kitchen losartan (COZAAR) 50 MG tablet Take 1 tablet (50 mg total) by mouth daily.  . meloxicam (MOBIC) 7.5 MG tablet Take 1 tablet (7.5 mg total) by mouth daily.  . metoprolol succinate  (TOPROL-XL) 25 MG 24 hr tablet Take 1 tablet (25 mg total) by mouth daily.  . mometasone-formoterol (DULERA) 100-5 MCG/ACT AERO Inhale 2 puffs into the lungs every 12 (twelve) hours.  . Multiple Vitamin (MULTI-VITAMINS) TABS Take by mouth.  Gean Birchwood ER 200 MG TB12 200 mg bid  . oxyCODONE-acetaminophen (PERCOCET) 7.5-325 MG per tablet Take 1 tablet by mouth every 4 (four) hours as needed for pain.   . predniSONE (STERAPRED UNI-PAK 21 TAB) 10 MG (21) TBPK tablet 6 day taper - take by mouth as directed for 6 days  . pregabalin (LYRICA) 50 MG capsule Take 100 mg by mouth 3 (three) times daily.   Marland Kitchen senna (SENOKOT) 8.6 MG tablet Take by mouth.  . SYRINGE-NEEDLE, DISP, 3 ML (MONOJECT 3CC SYR 22GX1") 22G X 1" 3 ML MISC Use as directed.  Marland Kitchen TIROSINT 150 MCG CAPS TAKE ONE CAPSULE BY MOUTH EVERY MORNING FOR HYPOTHYROIDISM ONE-HALF HOUR BEFORE A MEAL  . [DISCONTINUED] testosterone cypionate (DEPOTESTOTERONE CYPIONATE) 100 MG/ML injection Inject 75 mg into the muscle every 7 (seven) days. For IM use only   . [DISCONTINUED] amphetamine-dextroamphetamine (ADDERALL) 30 MG tablet Take 1 tablet by mouth daily. (Patient not taking: Reported on 10/31/2017)  . [DISCONTINUED] amphetamine-dextroamphetamine (ADDERALL) 30 MG tablet Take 1 tablet by mouth 3 (three) times daily. (Patient not taking: Reported on 10/31/2017)   No facility-administered  encounter medications on file as of 11/25/2017.       Medical History: Past Medical History:  Diagnosis Date  . Allergy    takes allergy shots  . Anemia   . Arthritis   . Asthma   . Barrett esophagus   . Bronchitis   . Chronic pain   . Colon polyp   . Depression   . GERD (gastroesophageal reflux disease)   . Hemorrhoid   . Hyperlipidemia   . Hypertension   . Hypothyroid   . IBS (irritable bowel syndrome)   . Kidney stone   . MI (myocardial infarction) ([place])   . Migraine   . Pneumonia   . Seizures ([place])   . Sinus problem   . Spinal cord stimulator status       Vital Signs: BP 133/72   Pulse 78   Temp (!) 97.4 F (36.3 C)   Resp 16   Ht 6' (1.829 m)   Wt 209 lb (94.8 kg)   SpO2 96%   BMI 28.35 kg/m    Review of Systems  Constitutional: Negative.  Negative for chills, fatigue and unexpected weight change.  HENT: Negative.  Negative for congestion, rhinorrhea, sneezing and sore throat.   Eyes: Negative for redness.  Respiratory: Negative.  Negative for cough, chest tightness and shortness of breath.   Cardiovascular: Negative.  Negative for chest pain and palpitations.  Gastrointestinal: Positive for abdominal pain, diarrhea and nausea. Negative for constipation and vomiting.  Endocrine: Negative.   Genitourinary: Negative.  Negative for dysuria and frequency.  Musculoskeletal: Negative.  Negative for arthralgias, back pain, joint swelling and neck pain.  Skin: Negative.  Negative for rash.  Allergic/Immunologic: Negative.   Neurological: Negative.  Negative for tremors and numbness.  Hematological: Negative for adenopathy. Does not bruise/bleed easily.  Psychiatric/Behavioral: Negative.  Negative for behavioral problems, sleep disturbance and suicidal ideas. The patient is not nervous/anxious.     Physical Exam  Constitutional: He is oriented to person, place, and time. He appears well-developed and well-nourished. No distress.  HENT:  Head: Normocephalic and atraumatic.  Mouth/Throat: Oropharynx is clear and moist. No oropharyngeal exudate.  Eyes: Pupils are equal, round, and reactive to light. EOM are normal.  Neck: Normal range of motion. Neck supple. No JVD present. No tracheal deviation present. No thyromegaly present.  Cardiovascular: Normal rate, regular rhythm and normal heart sounds. Exam reveals no gallop and no friction rub.  No murmur heard. Pulmonary/Chest: Effort normal and breath sounds normal. No respiratory distress. He has no wheezes. He has no rales. He exhibits no tenderness.  Abdominal: Soft. There is no  hepatosplenomegaly. There is no tenderness. There is rebound and tenderness at McBurney's point. There is no guarding.  Musculoskeletal: Normal range of motion.  Lymphadenopathy:    He has no cervical adenopathy.  Neurological: He is alert and oriented to person, place, and time. No cranial nerve deficit.  Skin: Skin is warm and dry. He is not diaphoretic.  Psychiatric: He has a normal mood and affect. His behavior is normal. Judgment and thought content normal.  Nursing note and vitals reviewed.   Assessment/Plan: 1. Generalized abdominal pain Given physical appearance and exam, Ct Abd ordered.  Will follow up with results.  - CT ABDOMEN W WO CONTRAST; Future  2. Nausea Pt had one moment of nausea in exam room. Has not complained of anymore.    General Counseling: dewey viens understanding of the findings of todays visit and agrees with plan of treatment. I  have discussed any further diagnostic evaluation that may be needed or ordered today. We also reviewed his medications today. he has been encouraged to call the office with any questions or concerns that should arise related to todays visit.   Orders Placed This Encounter  Procedures  . CT ABDOMEN W WO CONTRAST  . CT Abdomen Pelvis W Contrast     Time spent: 25 Minutes  This patient was seen by Orson Gear AGNP-C in Collaboration with Dr Lavera Guise as a part of collaborative care agreement

## 2017-11-25 NOTE — Telephone Encounter (Signed)
Patient scheduled for stat ct abd w/ contrast on 11/25/17 at [place].tat

## 2017-11-28 ENCOUNTER — Encounter: Payer: Self-pay | Admitting: Adult Health

## 2017-11-28 ENCOUNTER — Other Ambulatory Visit: Payer: Self-pay | Admitting: Adult Health

## 2017-11-28 MED ORDER — TESTOSTERONE CYPIONATE 100 MG/ML IM SOLN: 75.0000 mg | INTRAMUSCULAR | 3 refills | Status: DC

## 2017-11-29 DIAGNOSIS — M5417 Radiculopathy, lumbosacral region: Secondary | ICD-10-CM | POA: Diagnosis not present

## 2017-11-29 DIAGNOSIS — M961 Postlaminectomy syndrome, not elsewhere classified: Secondary | ICD-10-CM | POA: Diagnosis not present

## 2017-11-29 DIAGNOSIS — G894 Chronic pain syndrome: Secondary | ICD-10-CM | POA: Diagnosis not present

## 2017-11-29 DIAGNOSIS — Z79891 Long term (current) use of opiate analgesic: Secondary | ICD-10-CM | POA: Diagnosis not present

## 2017-12-04 ENCOUNTER — Emergency Department: Payer: BLUE CROSS/BLUE SHIELD

## 2017-12-04 ENCOUNTER — Other Ambulatory Visit: Payer: Self-pay | Admitting: Adult Health

## 2017-12-04 ENCOUNTER — Other Ambulatory Visit: Payer: Self-pay

## 2017-12-04 ENCOUNTER — Emergency Department: Payer: BLUE CROSS/BLUE SHIELD | Admitting: Anesthesiology

## 2017-12-04 ENCOUNTER — Encounter
Admission: EM | Disposition: A | Discharge status: Home / Self Care | Payer: Self-pay | Source: Home / Self Care | Attending: Emergency Medicine

## 2017-12-04 ENCOUNTER — Observation Stay
Admission: EM | Admit: 2017-12-04 | Discharge: 2017-12-05 | Disposition: A | Discharge status: Home / Self Care | Payer: BLUE CROSS/BLUE SHIELD | Attending: Surgery | Admitting: Surgery

## 2017-12-04 DIAGNOSIS — Z888 Allergy status to other drugs, medicaments and biological substances status: Secondary | ICD-10-CM | POA: Insufficient documentation

## 2017-12-04 DIAGNOSIS — R1031 Right lower quadrant pain: Secondary | ICD-10-CM

## 2017-12-04 DIAGNOSIS — J45909 Unspecified asthma, uncomplicated: Secondary | ICD-10-CM | POA: Diagnosis not present

## 2017-12-04 DIAGNOSIS — M1990 Unspecified osteoarthritis, unspecified site: Secondary | ICD-10-CM | POA: Insufficient documentation

## 2017-12-04 DIAGNOSIS — K3580 Unspecified acute appendicitis: Secondary | ICD-10-CM

## 2017-12-04 DIAGNOSIS — E785 Hyperlipidemia, unspecified: Secondary | ICD-10-CM | POA: Insufficient documentation

## 2017-12-04 DIAGNOSIS — Z79899 Other long term (current) drug therapy: Secondary | ICD-10-CM | POA: Insufficient documentation

## 2017-12-04 DIAGNOSIS — Z880 Allergy status to penicillin: Secondary | ICD-10-CM | POA: Insufficient documentation

## 2017-12-04 DIAGNOSIS — G8929 Other chronic pain: Secondary | ICD-10-CM | POA: Insufficient documentation

## 2017-12-04 DIAGNOSIS — F329 Major depressive disorder, single episode, unspecified: Secondary | ICD-10-CM | POA: Diagnosis not present

## 2017-12-04 DIAGNOSIS — K219 Gastro-esophageal reflux disease without esophagitis: Secondary | ICD-10-CM | POA: Insufficient documentation

## 2017-12-04 DIAGNOSIS — Z23 Encounter for immunization: Secondary | ICD-10-CM | POA: Insufficient documentation

## 2017-12-04 DIAGNOSIS — K353 Acute appendicitis with localized peritonitis, without perforation or gangrene: Secondary | ICD-10-CM | POA: Diagnosis not present

## 2017-12-04 DIAGNOSIS — K37 Unspecified appendicitis: Secondary | ICD-10-CM | POA: Diagnosis not present

## 2017-12-04 DIAGNOSIS — I252 Old myocardial infarction: Secondary | ICD-10-CM | POA: Insufficient documentation

## 2017-12-04 DIAGNOSIS — E039 Hypothyroidism, unspecified: Secondary | ICD-10-CM | POA: Insufficient documentation

## 2017-12-04 DIAGNOSIS — I10 Essential (primary) hypertension: Secondary | ICD-10-CM | POA: Insufficient documentation

## 2017-12-04 DIAGNOSIS — Z87891 Personal history of nicotine dependence: Secondary | ICD-10-CM | POA: Diagnosis not present

## 2017-12-04 HISTORY — PX: LAPAROSCOPIC APPENDECTOMY: SHX408

## 2017-12-04 HISTORY — DX: Unspecified acute appendicitis: K35.80

## 2017-12-04 LAB — CBC
HCT: 40.3 % (ref 39.0–52.0)
Hemoglobin: 12.9 g/dL — ABNORMAL LOW (ref 13.0–17.0)
MCH: 26 pg (ref 26.0–34.0)
MCHC: 32 g/dL (ref 30.0–36.0)
MCV: 81.3 fL (ref 80.0–100.0)
Platelets: 185 10*3/uL (ref 150–400)
RBC: 4.96 MIL/uL (ref 4.22–5.81)
RDW: 17.5 % — ABNORMAL HIGH (ref 11.5–15.5)
WBC: 17.1 10*3/uL — ABNORMAL HIGH (ref 4.0–10.5)
nRBC: 0 % (ref 0.0–0.2)

## 2017-12-04 LAB — COMPREHENSIVE METABOLIC PANEL
ALT: 20 U/L (ref 0–44)
AST: 20 U/L (ref 15–41)
Albumin: 3.9 g/dL (ref 3.5–5.0)
Alkaline Phosphatase: 64 U/L (ref 38–126)
Anion gap: 10 (ref 5–15)
BUN: 20 mg/dL (ref 6–20)
CO2: 27 mmol/L (ref 22–32)
Calcium: 8.2 mg/dL — ABNORMAL LOW (ref 8.9–10.3)
Chloride: 101 mmol/L (ref 98–111)
Creatinine, Ser: 1.16 mg/dL (ref 0.61–1.24)
GFR calc Af Amer: >60 mL/min (ref >60)
GFR calc non Af Amer: >60 mL/min (ref >60)
Glucose, Bld: 86 mg/dL (ref 70–99)
Potassium: 3.8 mmol/L (ref 3.5–5.1)
Sodium: 138 mmol/L (ref 135–145)
Total Bilirubin: 0.8 mg/dL (ref 0.3–1.2)
Total Protein: 6.7 g/dL (ref 6.5–8.1)

## 2017-12-04 LAB — URINALYSIS, COMPLETE (UACMP) WITH MICROSCOPIC
Bilirubin Urine: NEGATIVE
Glucose, UA: NEGATIVE mg/dL
Hgb urine dipstick: NEGATIVE
Ketones, ur: NEGATIVE mg/dL
Leukocytes, UA: NEGATIVE
Nitrite: NEGATIVE
Protein, ur: 30 mg/dL — AB
Specific Gravity, Urine: 1.028 (ref 1.005–1.030)
pH: 5 (ref 5.0–8.0)

## 2017-12-04 LAB — LIPASE, BLOOD: Lipase: 21 U/L (ref 11–51)

## 2017-12-04 SURGERY — APPENDECTOMY, LAPAROSCOPIC
Anesthesia: General

## 2017-12-04 MED ORDER — LOSARTAN POTASSIUM 50 MG PO TABS
50.0000 mg | ORAL_TABLET | Freq: Every day | ORAL | Status: DC
  Filled 2017-12-04: qty 1

## 2017-12-04 MED ORDER — MORPHINE SULFATE (PF) 2 MG/ML IV SOLN
2.0000 mg | INTRAVENOUS | Status: DC | PRN
  Administered 2017-12-05 (×2): 2 mg via INTRAVENOUS
  Filled 2017-12-04 (×2): qty 1

## 2017-12-04 MED ORDER — ONDANSETRON HCL 4 MG/2ML IJ SOLN
INTRAMUSCULAR | Status: DC | PRN
  Administered 2017-12-04: 4 mg via INTRAVENOUS

## 2017-12-04 MED ORDER — FENTANYL CITRATE (PF) 100 MCG/2ML IJ SOLN
INTRAMUSCULAR | Status: AC
  Administered 2017-12-04: 25 ug via INTRAVENOUS
  Filled 2017-12-04: qty 2

## 2017-12-04 MED ORDER — LIDOCAINE HCL 1 % IJ SOLN
INTRAMUSCULAR | Status: DC | PRN
  Administered 2017-12-04: 20 mL

## 2017-12-04 MED ORDER — SODIUM CHLORIDE 0.9 % IV BOLUS
1000.0000 mL | Freq: Once | INTRAVENOUS | Status: AC
  Administered 2017-12-04: 1000 mL via INTRAVENOUS

## 2017-12-04 MED ORDER — ATORVASTATIN CALCIUM 20 MG PO TABS: 10.0000 mg | ORAL_TABLET | Freq: Every day | ORAL | Status: DC

## 2017-12-04 MED ORDER — ONDANSETRON HCL 4 MG/2ML IJ SOLN
4.0000 mg | Freq: Four times a day (QID) | INTRAMUSCULAR | Status: DC | PRN
  Administered 2017-12-05: 4 mg via INTRAVENOUS
  Filled 2017-12-04: qty 2

## 2017-12-04 MED ORDER — FENTANYL CITRATE (PF) 100 MCG/2ML IJ SOLN
50.0000 ug | Freq: Once | INTRAMUSCULAR | Status: AC
  Administered 2017-12-04: 50 ug via INTRAVENOUS
  Filled 2017-12-04: qty 2

## 2017-12-04 MED ORDER — ONDANSETRON HCL 4 MG/2ML IJ SOLN: 4.0000 mg | Freq: Once | INTRAMUSCULAR | Status: DC | PRN

## 2017-12-04 MED ORDER — ROCURONIUM BROMIDE 100 MG/10ML IV SOLN
INTRAVENOUS | Status: DC | PRN
  Administered 2017-12-04: 40 mg via INTRAVENOUS

## 2017-12-04 MED ORDER — METRONIDAZOLE IN NACL 5-0.79 MG/ML-% IV SOLN
500.0000 mg | Freq: Once | INTRAVENOUS | Status: AC
  Administered 2017-12-04: 500 mg via INTRAVENOUS

## 2017-12-04 MED ORDER — DEXAMETHASONE SODIUM PHOSPHATE 10 MG/ML IJ SOLN
INTRAMUSCULAR | Status: DC | PRN
  Administered 2017-12-04: 10 mg via INTRAVENOUS

## 2017-12-04 MED ORDER — MIDAZOLAM HCL 2 MG/2ML IJ SOLN
INTRAMUSCULAR | Status: DC | PRN
  Administered 2017-12-04: 2 mg via INTRAVENOUS

## 2017-12-04 MED ORDER — KETOROLAC TROMETHAMINE 30 MG/ML IJ SOLN
30.0000 mg | Freq: Four times a day (QID) | INTRAMUSCULAR | Status: DC
  Administered 2017-12-04 – 2017-12-05 (×3): 30 mg via INTRAVENOUS
  Filled 2017-12-04 (×3): qty 1

## 2017-12-04 MED ORDER — MIDAZOLAM HCL 2 MG/2ML IJ SOLN
INTRAMUSCULAR | Status: AC
  Filled 2017-12-04: qty 2

## 2017-12-04 MED ORDER — METRONIDAZOLE IN NACL 5-0.79 MG/ML-% IV SOLN
500.0000 mg | Freq: Three times a day (TID) | INTRAVENOUS | Status: DC
  Administered 2017-12-04 – 2017-12-05 (×2): 500 mg via INTRAVENOUS
  Filled 2017-12-04 (×4): qty 100

## 2017-12-04 MED ORDER — SUGAMMADEX SODIUM 500 MG/5ML IV SOLN
INTRAVENOUS | Status: DC | PRN
  Administered 2017-12-04: 200 mg via INTRAVENOUS

## 2017-12-04 MED ORDER — CHLORHEXIDINE GLUCONATE CLOTH 2 % EX PADS: 6.0000 | MEDICATED_PAD | Freq: Once | CUTANEOUS | Status: DC

## 2017-12-04 MED ORDER — LIDOCAINE HCL (PF) 1 % IJ SOLN
INTRAMUSCULAR | Status: AC
  Filled 2017-12-04: qty 30

## 2017-12-04 MED ORDER — CIPROFLOXACIN IN D5W 400 MG/200ML IV SOLN
400.0000 mg | Freq: Two times a day (BID) | INTRAVENOUS | Status: DC
  Administered 2017-12-04 – 2017-12-05 (×2): 400 mg via INTRAVENOUS
  Filled 2017-12-04 (×4): qty 200

## 2017-12-04 MED ORDER — CIPROFLOXACIN IN D5W 400 MG/200ML IV SOLN
INTRAVENOUS | Status: AC
  Administered 2017-12-04: 400 mg via INTRAVENOUS
  Filled 2017-12-04: qty 200

## 2017-12-04 MED ORDER — FENTANYL CITRATE (PF) 100 MCG/2ML IJ SOLN
INTRAMUSCULAR | Status: AC
  Filled 2017-12-04: qty 2

## 2017-12-04 MED ORDER — CIPROFLOXACIN IN D5W 400 MG/200ML IV SOLN
400.0000 mg | Freq: Once | INTRAVENOUS | Status: AC
  Administered 2017-12-04: 400 mg via INTRAVENOUS

## 2017-12-04 MED ORDER — MELOXICAM 7.5 MG PO TABS: 7.5000 mg | ORAL_TABLET | Freq: Every day | ORAL | Status: DC

## 2017-12-04 MED ORDER — ENOXAPARIN SODIUM 40 MG/0.4ML ~~LOC~~ SOLN
40.0000 mg | SUBCUTANEOUS | Status: DC
  Administered 2017-12-05: 40 mg via SUBCUTANEOUS
  Filled 2017-12-04: qty 0.4

## 2017-12-04 MED ORDER — METOPROLOL SUCCINATE ER 50 MG PO TB24
25.0000 mg | ORAL_TABLET | Freq: Every day | ORAL | Status: DC
  Filled 2017-12-04: qty 1

## 2017-12-04 MED ORDER — LACTATED RINGERS IV SOLN
INTRAVENOUS | Status: DC | PRN
  Administered 2017-12-04: 20:00:00 via INTRAVENOUS

## 2017-12-04 MED ORDER — ONDANSETRON HCL 4 MG/2ML IJ SOLN
4.0000 mg | Freq: Once | INTRAMUSCULAR | Status: AC
  Administered 2017-12-04: 4 mg via INTRAVENOUS
  Filled 2017-12-04: qty 2

## 2017-12-04 MED ORDER — BUPIVACAINE HCL (PF) 0.5 % IJ SOLN
INTRAMUSCULAR | Status: AC
  Filled 2017-12-04: qty 30

## 2017-12-04 MED ORDER — OXYCODONE HCL 5 MG PO TABS
5.0000 mg | ORAL_TABLET | ORAL | Status: DC | PRN
  Administered 2017-12-04 – 2017-12-05 (×2): 10 mg via ORAL
  Filled 2017-12-04 (×2): qty 2

## 2017-12-04 MED ORDER — ALBUTEROL SULFATE (2.5 MG/3ML) 0.083% IN NEBU: 3.0000 mL | INHALATION_SOLUTION | RESPIRATORY_TRACT | Status: DC | PRN

## 2017-12-04 MED ORDER — METRONIDAZOLE IN NACL 5-0.79 MG/ML-% IV SOLN
INTRAVENOUS | Status: AC
  Administered 2017-12-04: 500 mg via INTRAVENOUS
  Filled 2017-12-04: qty 100

## 2017-12-04 MED ORDER — ACETAMINOPHEN 500 MG PO TABS
1000.0000 mg | ORAL_TABLET | Freq: Four times a day (QID) | ORAL | Status: DC
  Administered 2017-12-04 – 2017-12-05 (×2): 1000 mg via ORAL
  Filled 2017-12-04 (×3): qty 2

## 2017-12-04 MED ORDER — ACETAMINOPHEN 500 MG PO TABS: 1000.0000 mg | ORAL_TABLET | ORAL | Status: DC

## 2017-12-04 MED ORDER — PROPOFOL 10 MG/ML IV BOLUS
INTRAVENOUS | Status: AC
  Filled 2017-12-04: qty 20

## 2017-12-04 MED ORDER — FENTANYL CITRATE (PF) 100 MCG/2ML IJ SOLN
INTRAMUSCULAR | Status: DC | PRN
  Administered 2017-12-04 (×2): 50 ug via INTRAVENOUS

## 2017-12-04 MED ORDER — FENTANYL CITRATE (PF) 100 MCG/2ML IJ SOLN
25.0000 ug | INTRAMUSCULAR | Status: DC | PRN
  Administered 2017-12-04 (×4): 25 ug via INTRAVENOUS

## 2017-12-04 MED ORDER — IOPAMIDOL (ISOVUE-300) INJECTION 61%
100.0000 mL | Freq: Once | INTRAVENOUS | Status: AC | PRN
  Administered 2017-12-04: 100 mL via INTRAVENOUS

## 2017-12-04 MED ORDER — ONDANSETRON 4 MG PO TBDP: 4.0000 mg | ORAL_TABLET | Freq: Four times a day (QID) | ORAL | Status: DC | PRN

## 2017-12-04 MED ORDER — DEXTROSE IN LACTATED RINGERS 5 % IV SOLN
INTRAVENOUS | Status: DC
  Administered 2017-12-04: 22:00:00 via INTRAVENOUS

## 2017-12-04 MED ORDER — SUCCINYLCHOLINE CHLORIDE 20 MG/ML IJ SOLN
INTRAMUSCULAR | Status: DC | PRN
  Administered 2017-12-04: 100 mg via INTRAVENOUS

## 2017-12-04 MED ORDER — LIDOCAINE HCL (CARDIAC) PF 100 MG/5ML IV SOSY
PREFILLED_SYRINGE | INTRAVENOUS | Status: DC | PRN
  Administered 2017-12-04: 100 mg via INTRAVENOUS

## 2017-12-04 MED ORDER — PANTOPRAZOLE SODIUM 40 MG PO TBEC
40.0000 mg | DELAYED_RELEASE_TABLET | Freq: Every day | ORAL | Status: DC
  Administered 2017-12-05: 40 mg via ORAL
  Filled 2017-12-04: qty 1

## 2017-12-04 MED ORDER — PROPOFOL 10 MG/ML IV BOLUS
INTRAVENOUS | Status: DC | PRN
  Administered 2017-12-04: 160 mg via INTRAVENOUS

## 2017-12-04 SURGICAL SUPPLY — 44 items
ADH SKN CLS APL DERMABOND .7 (GAUZE/BANDAGES/DRESSINGS) ×1
BAG SPEC RTRVL LRG 6X4 10 (ENDOMECHANICALS) ×1
BLADE SURG SZ11 CARB STEEL (BLADE) ×2 IMPLANT
CANISTER SUCT 3000ML PPV (MISCELLANEOUS) ×2 IMPLANT
CHLORAPREP W/TINT 26ML (MISCELLANEOUS) ×2 IMPLANT
COVER WAND RF STERILE (DRAPES) ×2 IMPLANT
CUTTER FLEX LINEAR 45M (STAPLE) ×2 IMPLANT
DECANTER SPIKE VIAL GLASS SM (MISCELLANEOUS) ×2 IMPLANT
DERMABOND ADVANCED (GAUZE/BANDAGES/DRESSINGS) ×1
DERMABOND ADVANCED .7 DNX12 (GAUZE/BANDAGES/DRESSINGS) ×1 IMPLANT
ELECT REM PT RETURN 9FT ADLT (ELECTROSURGICAL) ×2
ELECTRODE REM PT RTRN 9FT ADLT (ELECTROSURGICAL) ×1 IMPLANT
GLOVE BIO SURGEON STRL SZ7 (GLOVE) ×2 IMPLANT
GLOVE BIOGEL PI IND STRL 7.5 (GLOVE) ×1 IMPLANT
GLOVE BIOGEL PI INDICATOR 7.5 (GLOVE) ×1
GOWN STRL REUS W/ TWL LRG LVL4 (GOWN DISPOSABLE) ×1 IMPLANT
GOWN STRL REUS W/ TWL XL LVL3 (GOWN DISPOSABLE) ×1 IMPLANT
GOWN STRL REUS W/TWL LRG LVL4 (GOWN DISPOSABLE) ×2
GOWN STRL REUS W/TWL XL LVL3 (GOWN DISPOSABLE) ×2
GRASPER SUT TROCAR 14GX15 (MISCELLANEOUS) ×2 IMPLANT
IRRIGATION STRYKERFLOW (MISCELLANEOUS) IMPLANT
IRRIGATOR STRYKERFLOW (MISCELLANEOUS)
KIT TURNOVER KIT A (KITS) ×2 IMPLANT
NEEDLE HYPO 22GX1.5 SAFETY (NEEDLE) ×2 IMPLANT
NEEDLE INSUFFLATION 14GA 120MM (NEEDLE) ×2 IMPLANT
NS IRRIG 1000ML POUR BTL (IV SOLUTION) ×2 IMPLANT
PACK LAP CHOLECYSTECTOMY (MISCELLANEOUS) ×2 IMPLANT
POUCH SPECIMEN RETRIEVAL 10MM (ENDOMECHANICALS) ×2 IMPLANT
RELOAD 45 VASCULAR/THIN (ENDOMECHANICALS) IMPLANT
RELOAD STAPLE 45 2.5 WHT GRN (ENDOMECHANICALS) IMPLANT
RELOAD STAPLE 45 3.5 BLU ETS (ENDOMECHANICALS) ×1 IMPLANT
RELOAD STAPLE TA45 3.5 REG BLU (ENDOMECHANICALS) ×2 IMPLANT
SHEARS HARMONIC ACE PLUS 36CM (ENDOMECHANICALS) ×2 IMPLANT
SLEEVE ENDOPATH XCEL 5M (ENDOMECHANICALS) ×2 IMPLANT
SOL .9 NS 3000ML IRR  AL (IV SOLUTION)
SOL .9 NS 3000ML IRR AL (IV SOLUTION)
SOL .9 NS 3000ML IRR UROMATIC (IV SOLUTION) IMPLANT
SUT MNCRL AB 4-0 PS2 18 (SUTURE) ×2 IMPLANT
SUT VICRYL 0 UR6 27IN ABS (SUTURE) ×2 IMPLANT
SUT VICRYL AB 3-0 FS1 BRD 27IN (SUTURE) ×2 IMPLANT
TRAY FOLEY MTR SLVR 16FR STAT (SET/KITS/TRAYS/PACK) ×2 IMPLANT
TROCAR XCEL 12X100 BLDLESS (ENDOMECHANICALS) ×2 IMPLANT
TROCAR XCEL NON-BLD 5MMX100MML (ENDOMECHANICALS) ×2 IMPLANT
TUBING INSUFFLATION (TUBING) ×2 IMPLANT

## 2017-12-04 NOTE — H&P (Signed)
SURGICAL ADMISSION HISTORY & PHYSICAL  HISTORY OF PRESENT ILLNESS (HPI):  57 y.o. male presented to [facility] ED today for evaluation of abdominal pain. Patient reports he began experiencing RLQ abdominal pain ~18 days ago in the vicinity of his spinal cord stimulator, for which he was seen by his primary care NP, who ordered a CT abdomen/pelvic, which did not reveal any acute abnormalities. Since then, he experienced some diarrhea a few days ago, becoming more "shredded" BM yesterday with a single episode of night sweat last night. Patient otherwise reports nausea without emesis, denies CP or SOB despite MI and COPD diagnosed 2006, around which time patient says he quit smoking after his father was diagnosed and died from smoking-associated lung cancer. Patient specifically says he is able to walk >2 blocks and up/down a flight of steps without CP or SOB and does not take aspirin or Plavix. He likewise denies any prior open abdominal surgeries and has eaten solid food since yesterday, drank a little water earlier today.  Surgery is consulted by ED physician Dr. Archie Balboa in this context for evaluation and management of acute appendicitis.  PAST MEDICAL HISTORY (PMH):  Past Medical History:  Diagnosis Date  . Allergy    takes allergy shots  . Anemia   . Arthritis   . Asthma   . Barrett esophagus   . Bronchitis   . Chronic pain   . Colon polyp   . Depression   . GERD (gastroesophageal reflux disease)   . Hemorrhoid   . Hyperlipidemia   . Hypertension   . Hypothyroid   . IBS (irritable bowel syndrome)   . Kidney stone   . MI (myocardial infarction) ([place])   . Migraine   . Pneumonia   . Seizures ([place])   . Sinus problem   . Spinal cord stimulator status     PAST SURGICAL HISTORY ([place]):  Past Surgical History:  Procedure Laterality Date  . BACK SURGERY    . COLONOSCOPY  August 2015  . elbow surgery    . HEMORRHOID SURGERY    . LIPOMA EXCISION Right    leg  . multiple leg surgery to  remove angiolipoma    . SPINAL CORD STIMULATOR IMPLANT    . UPPER GI ENDOSCOPY  August 2015    MEDICATIONS:  Prior to Admission medications   Medication Sig Start Date End Date Taking? Authorizing Provider  albuterol (PROVENTIL HFA;VENTOLIN HFA) 108 (90 Base) MCG/ACT inhaler Frequency:PHARMDIR   Dosage:90   MCG  Instructions:  Note:Dose: 55 MCG 12/22/11   [provider]  alfuzosin (UROXATRAL) 10 MG 24 hr tablet Take 10 mg by mouth daily.    [provider]  ALPRAZolam Duanne Moron) 1 MG tablet Take 1 tablet (1 mg total) by mouth 3 (three) times daily as needed for anxiety. 06/30/17   Ronnell Freshwater, NP  amphetamine-dextroamphetamine (ADDERALL) 30 MG tablet Take 1 tablet by mouth 3 (three) times daily. 10/13/17   Kendell Bane, NP  atorvastatin (LIPITOR) 10 MG tablet Take 1 tablet (10 mg total) by mouth daily. 09/20/17   Ronnell Freshwater, NP  azelastine (ASTELIN) 0.1 % nasal spray Place 1 spray into both nostrils 2 (two) times daily. 02/10/17   Ronnell Freshwater, NP  buPROPion (WELLBUTRIN XL) 300 MG 24 hr tablet Take 1 tablet (300 mg total) by mouth daily. 10/10/17   Ronnell Freshwater, NP  citalopram (CELEXA) 20 MG tablet Take 1 tablet (20 mg total) by mouth daily. 11/07/17   Scarboro,  Audie Clear, NP  diphenhydrAMINE (SOMINEX) 25 MG tablet Take 25 mg by mouth as needed for sleep.    [provider]  esomeprazole (NEXIUM) 40 MG capsule TK ONE C PO  BID UTD 03/15/16   [provider]  l-methylfolate-B6-B12 (METANX) 3-35-2 MG TABS Take 1 tablet by mouth 2 (two) times daily.    [provider]  losartan (COZAAR) 50 MG tablet Take 1 tablet (50 mg total) by mouth daily. 09/20/17   Ronnell Freshwater, NP  meloxicam (MOBIC) 7.5 MG tablet Take 1 tablet (7.5 mg total) by mouth daily. 03/31/17   Ronnell Freshwater, NP  metoprolol succinate (TOPROL-XL) 25 MG 24 hr tablet Take 1 tablet (25 mg total) by mouth daily. 09/20/17   Ronnell Freshwater, NP  mometasone-formoterol (DULERA)  100-5 MCG/ACT AERO Inhale 2 puffs into the lungs every 12 (twelve) hours.    [provider]  Multiple Vitamin (MULTI-VITAMINS) TABS Take by mouth.    [provider]  NUCYNTA ER 200 MG TB12 200 mg bid 01/09/16   [provider]  oxyCODONE-acetaminophen (PERCOCET) 7.5-325 MG per tablet Take 1 tablet by mouth every 4 (four) hours as needed for pain.     [provider]  predniSONE (STERAPRED UNI-PAK 21 TAB) 10 MG (21) TBPK tablet 6 day taper - take by mouth as directed for 6 days 08/10/17   Ronnell Freshwater, NP  pregabalin (LYRICA) 50 MG capsule Take 100 mg by mouth 3 (three) times daily.     [provider]  senna (SENOKOT) 8.6 MG tablet Take by mouth.    [provider]  SYRINGE-NEEDLE, DISP, 3 ML (MONOJECT 3CC SYR 22GX1") 22G X 1" 3 ML MISC Use as directed. 02/17/15   [provider]  testosterone cypionate (DEPOTESTOTERONE CYPIONATE) 100 MG/ML injection Inject 0.75 mLs (75 mg total) into the muscle every 7 (seven) days. For IM use only 11/28/17   Scarboro, Audie Clear, NP  TIROSINT 150 MCG CAPS TAKE ONE CAPSULE BY MOUTH EVERY MORNING FOR HYPOTHYROIDISM ONE-HALF HOUR BEFORE A MEAL 07/19/17   Ronnell Freshwater, NP    ALLERGIES:  Allergies  Allergen Reactions  . Amoxicillin Anaphylaxis    REACTION: unspecified  . Penicillin G Anaphylaxis  . Gabapentin Other (See Comments)    REACTION: anaphylaxis  . Penicillins     REACTION: unspecified  . Adhesive [Tape] Rash    Band-aid    SOCIAL HISTORY:  Social History   Socioeconomic History  . Marital status: Married    Spouse name: Not on file  . Number of children: Not on file  . Years of education: Not on file  . Highest education level: Not on file  Occupational History  . Not on file  Social Needs  . Financial resource strain: Not on file  . Food insecurity:    Worry: Not on file    Inability: Not on file  . Transportation needs:    Medical: Not on file    Non-medical: Not on  file  Tobacco Use  . Smoking status: Former Smoker    Packs/day: 1.00    Years: 30.00    Pack years: 30.00    Types: Cigarettes    Last attempt to quit: 02/22/2005    Years since quitting: 12.7  . Smokeless tobacco: Never Used  Substance and Sexual Activity  . Alcohol use: Yes    Alcohol/week: 0.0 standard drinks    Comment: occasionally  . Drug use: No    Comment:  past  . Sexual activity: Yes  Lifestyle  . Physical activity:    Days per week: Not on file    Minutes per session: Not on file  . Stress: Not on file  Relationships  . Social connections:    Talks on phone: Not on file    Gets together: Not on file    Attends religious service: Not on file    Active member of club or organization: Not on file    Attends meetings of clubs or organizations: Not on file    Relationship status: Not on file  . Intimate partner violence:    Fear of current or ex partner: Not on file    Emotionally abused: Not on file    Physically abused: Not on file    Forced sexual activity: Not on file  Other Topics Concern  . Not on file  Social History Narrative  . Not on file    The patient currently resides (home / rehab facility / nursing home): Home The patient normally is (ambulatory / bedbound): Ambulatory   FAMILY HISTORY:  Family History  Problem Relation Age of Onset  . Hypertension Mother   . Lung disease Mother   . Heart attack Father   . Diabetes Father     REVIEW OF SYSTEMS:  Constitutional: denies weight loss, fever, chills, or sweats  Eyes: denies any other vision changes, history of eye injury  ENT: denies sore throat, hearing problems  Respiratory: denies shortness of breath, wheezing  Cardiovascular: denies chest pain, palpitations  Gastrointestinal: abdominal pain, N/V, and bowel function as per HPI Genitourinary: denies burning with urination or urinary frequency Musculoskeletal: denies any other joint pains or cramps  Skin: denies any other rashes or skin  discolorations  Neurological: denies any other headache, dizziness, weakness  Psychiatric: denies any other depression, anxiety   All other review of systems were negative   VITAL SIGNS:  Temp:  [98.1 F (36.7 C)] 98.1 F (36.7 C) (10/13 1525) Pulse Rate:  [67-86] 76 (10/13 1800) Resp:  [0-25] 25 (10/13 1800) BP: (104-124)/(63-67) 112/67 (10/13 1800) SpO2:  [96 %-99 %] 99 % (10/13 1800) Weight:  [95.3 kg] 95.3 kg (10/13 1525)     Height: 6' (182.9 cm) Weight: 95.3 kg BMI (Calculated): 28.47   INTAKE/OUTPUT:  This shift: No intake/output data recorded.  Last 2 shifts: @IOLAST2SHIFTS @   PHYSICAL EXAM:  Constitutional:  -- Normal body habitus  -- Awake, alert, and oriented x3, no apparent distress Eyes:  -- Pupils equally round and reactive to light  -- No scleral icterus, B/L no occular discharge Ear, nose, throat: -- Neck is FROM WNL -- No jugular venous distension  Pulmonary:  -- No wheezes or rhales -- Equal breath sounds bilaterally -- Breathing non-labored at rest Cardiovascular:  -- S1, S2 present  -- No pericardial rubs  Gastrointestinal:  -- Abdomen soft and non-distended with moderate RLQ > suprapubic and LLQ abdominal tenderness to palpation, no epigastric tenderness to palpation, no guarding or rebound tenderness -- No abdominal masses appreciated, pulsatile or otherwise  Musculoskeletal and Integumentary:  -- Wounds or skin discoloration: None appreciated except umbilical and Right tragal piercings -- Extremities: B/L UE and LE FROM, hands and feet warm, no edema  Neurologic:  -- Motor function: Intact and symmetric -- Sensation: Intact and symmetric Psychiatric:  -- Mood and affect WNL  Labs:  CBC Latest Ref Rng & Units 12/04/2017 10/31/2017 09/27/2017  WBC 4.0 - 10.5 K/uL 17.1(H) 6.3 6.1  Hemoglobin  13.0 - 17.0 g/dL 12.9(L) 11.6(L) 12.7(L)  Hematocrit 39.0 - 52.0 % 40.3 36.1(L) 38.9  Platelets 150 - 400 K/uL 185 260 -   CMP Latest Ref Rng & Units  12/04/2017 11/25/2017 09/27/2017  Glucose 70 - 99 mg/dL 86 - 97  BUN 6 - 20 mg/dL 20 - 8  Creatinine 0.61 - 1.24 mg/dL 1.16 1.00 1.14  Sodium 135 - 145 mmol/L 138 - 145(H)  Potassium 3.5 - 5.1 mmol/L 3.8 - 4.1  Chloride 98 - 111 mmol/L 101 - 105  CO2 22 - 32 mmol/L 27 - 27  Calcium 8.9 - 10.3 mg/dL 8.2(L) - 8.7  Total Protein 6.5 - 8.1 g/dL 6.7 - 6.4  Total Bilirubin 0.3 - 1.2 mg/dL 0.8 - 0.4  Alkaline Phos 38 - 126 U/L 64 - 73  AST 15 - 41 U/L 20 - 22  ALT 0 - 44 U/L 20 - 22   Imaging studies:  CT Abdomen and Pelvis with Contrast (12/04/2017) - personally reviewed, compared with prior study, and discussed with patient 1. Acute appendicitis.  No free air.  No abscess. 2. Minimal sigmoid diverticulosis. 3.  Aortic Atherosclerosis  CT Abdomen and Pelvis with Contrast (11/25/2017) - personally reviewed and discussed with patient No evidence of acute abnormality within the solid abdominal organs. No evidence of small bowel obstruction significant inflammatory changes.  Assessment/Plan: (ICD-10's: K35.30) 57 y.o. male with acute appendicitis, complicated by pertinent comorbidities including HTN, HLD, CAD s/p MI (2006), asthma, GERD with Barrett esophagus, IBS, hypothyroidism, osteoarthritis with chronic pain s/p spine stimulator, nephrolithiasis, seizure disorder, and recent anemia of unclear etiology for which patient has been evaluated by hematology with relatively recent colonoscopy (family history colon cancer).   - NPO, IV fluids   - ciprofloxacin and metronidazole (PCN allergy with hives)  - all risks, benefits, and alternatives to appendectomy were discussed with the patient and his partner/spouse, all of their questions were answered to their expressed satisfaction, patient expresses he wishes to proceed, and informed consent was obtained and documented.  - will plan for laparoscopic appendectomy tonight pending anesthesia and OR availability  All of the above findings and  recommendations were discussed with the patient and his partner/spouse, and all of patient's and his family's questions were answered to their expressed satisfaction.  -- Marilynne Drivers Rosana Hoes, MD, [place]: [facility] - Partnering for exceptional care. Office: [phone]

## 2017-12-04 NOTE — Anesthesia Procedure Notes (Signed)
Procedure Name: Intubation Performed by: Lesle Reek, CRNA Pre-anesthesia Checklist: Patient identified, Emergency Drugs available, Suction available, Patient being monitored and Timeout performed Patient Re-evaluated:Patient Re-evaluated prior to induction Oxygen Delivery Method: Circle system utilized Preoxygenation: Pre-oxygenation with 100% oxygen Induction Type: IV induction Laryngoscope Size: Mac Grade View: Grade II Tube type: Oral Tube size: 7.5 mm Number of attempts: 1 Airway Equipment and Method: Stylet Placement Confirmation: ETT inserted through vocal cords under direct vision,  CO2 detector,  positive ETCO2 and breath sounds checked- equal and bilateral Secured at: 21 cm

## 2017-12-04 NOTE — Anesthesia Preprocedure Evaluation (Signed)
Anesthesia Evaluation  Patient identified by MRN, date of birth, ID band Patient awake    Reviewed: Allergy & Precautions, NPO status , Patient's Chart, lab work & pertinent test results, reviewed documented beta blocker date and time   Airway Mallampati: II  TM Distance: >3 FB     Dental   Pulmonary asthma , former smoker,    Pulmonary exam normal        Cardiovascular hypertension, Pt. on medications and Pt. on home beta blockers + Past MI  Normal cardiovascular exam     Neuro/Psych  Headaches, Seizures -,  PSYCHIATRIC DISORDERS Anxiety Depression  Neuromuscular disease    GI/Hepatic GERD  Medicated,  Endo/Other  Hypothyroidism   Renal/GU      Musculoskeletal  (+) Arthritis , Osteoarthritis,    Abdominal Normal abdominal exam  (+)   Peds negative pediatric ROS (+)  Hematology  (+) anemia ,   Anesthesia Other Findings   Reproductive/Obstetrics                             Anesthesia Physical Anesthesia Plan  ASA: III and emergent  Anesthesia Plan: General   Post-op Pain Management:    Induction: Intravenous, Rapid sequence and Cricoid pressure planned  PONV Risk Score and Plan:   Airway Management Planned: Oral ETT  Additional Equipment:   Intra-op Plan:   Post-operative Plan: Extubation in OR  Informed Consent: I have reviewed the patients History and Physical, chart, labs and discussed the procedure including the risks, benefits and alternatives for the proposed anesthesia with the patient or authorized representative who has indicated his/her understanding and acceptance.   Dental advisory given  Plan Discussed with: CRNA and Surgeon  Anesthesia Plan Comments:         Anesthesia Quick Evaluation

## 2017-12-04 NOTE — Consult Note (Signed)
SURGICAL CONSULTATION NOTE (initial) - cpt: 40102  HISTORY OF PRESENT ILLNESS (HPI):  57 y.o. male presented to [facility] ED today for evaluation of abdominal pain. Patient reports he began experiencing RLQ abdominal pain ~18 days ago in the vicinity of his spinal cord stimulator, for which he was seen by his primary care NP, who ordered a CT abdomen/pelvic, which did not reveal any acute abnormalities. Since then, he experienced some diarrhea a few days ago, becoming more "shredded" BM yesterday with a single episode of night sweat last night. Patient otherwise reports nausea without emesis, denies CP or SOB despite MI and COPD diagnosed 2006, around which time patient says he quit smoking after his father was diagnosed and died from smoking-associated lung cancer. Patient specifically says he is able to walk >2 blocks and up/down a flight of steps without CP or SOB and does not take aspirin or Plavix. He likewise denies any prior open abdominal surgeries and has eaten solid food since yesterday, drank a little water earlier today.  Surgery is consulted by ED physician Dr. Archie Balboa in this context for evaluation and management of acute appendicitis.  PAST MEDICAL HISTORY (PMH):  Past Medical History:  Diagnosis Date  . Allergy    takes allergy shots  . Anemia   . Arthritis   . Asthma   . Barrett esophagus   . Bronchitis   . Chronic pain   . Colon polyp   . Depression   . GERD (gastroesophageal reflux disease)   . Hemorrhoid   . Hyperlipidemia   . Hypertension   . Hypothyroid   . IBS (irritable bowel syndrome)   . Kidney stone   . MI (myocardial infarction) ([place])   . Migraine   . Pneumonia   . Seizures ([place])   . Sinus problem   . Spinal cord stimulator status      PAST SURGICAL HISTORY ([place]):  Past Surgical History:  Procedure Laterality Date  . BACK SURGERY    . COLONOSCOPY  August 2015  . elbow surgery    . HEMORRHOID SURGERY    . LIPOMA EXCISION Right    leg  . multiple leg  surgery to remove angiolipoma    . SPINAL CORD STIMULATOR IMPLANT    . UPPER GI ENDOSCOPY  August 2015     MEDICATIONS:  Prior to Admission medications   Medication Sig Start Date End Date Taking? Authorizing Provider  albuterol (PROVENTIL HFA;VENTOLIN HFA) 108 (90 Base) MCG/ACT inhaler Frequency:PHARMDIR   Dosage:90   MCG  Instructions:  Note:Dose: 21 MCG 12/22/11   [provider]  alfuzosin (UROXATRAL) 10 MG 24 hr tablet Take 10 mg by mouth daily.    [provider]  ALPRAZolam Duanne Moron) 1 MG tablet Take 1 tablet (1 mg total) by mouth 3 (three) times daily as needed for anxiety. 06/30/17   Ronnell Freshwater, NP  amphetamine-dextroamphetamine (ADDERALL) 30 MG tablet Take 1 tablet by mouth 3 (three) times daily. 10/13/17   Kendell Bane, NP  atorvastatin (LIPITOR) 10 MG tablet Take 1 tablet (10 mg total) by mouth daily. 09/20/17   Ronnell Freshwater, NP  azelastine (ASTELIN) 0.1 % nasal spray Place 1 spray into both nostrils 2 (two) times daily. 02/10/17   Ronnell Freshwater, NP  buPROPion (WELLBUTRIN XL) 300 MG 24 hr tablet Take 1 tablet (300 mg total) by mouth daily. 10/10/17   Ronnell Freshwater, NP  citalopram (CELEXA) 20 MG tablet Take 1 tablet (20 mg total) by mouth daily.  11/07/17   Kendell Bane, NP  diphenhydrAMINE (SOMINEX) 25 MG tablet Take 25 mg by mouth as needed for sleep.    [provider]  esomeprazole (NEXIUM) 40 MG capsule TK ONE C PO  BID UTD 03/15/16   [provider]  l-methylfolate-B6-B12 (METANX) 3-35-2 MG TABS Take 1 tablet by mouth 2 (two) times daily.    [provider]  losartan (COZAAR) 50 MG tablet Take 1 tablet (50 mg total) by mouth daily. 09/20/17   Ronnell Freshwater, NP  meloxicam (MOBIC) 7.5 MG tablet Take 1 tablet (7.5 mg total) by mouth daily. 03/31/17   Ronnell Freshwater, NP  metoprolol succinate (TOPROL-XL) 25 MG 24 hr tablet Take 1 tablet (25 mg total) by mouth daily. 09/20/17   Ronnell Freshwater, NP   mometasone-formoterol (DULERA) 100-5 MCG/ACT AERO Inhale 2 puffs into the lungs every 12 (twelve) hours.    [provider]  Multiple Vitamin (MULTI-VITAMINS) TABS Take by mouth.    [provider]  NUCYNTA ER 200 MG TB12 200 mg bid 01/09/16   [provider]  oxyCODONE-acetaminophen (PERCOCET) 7.5-325 MG per tablet Take 1 tablet by mouth every 4 (four) hours as needed for pain.     [provider]  predniSONE (STERAPRED UNI-PAK 21 TAB) 10 MG (21) TBPK tablet 6 day taper - take by mouth as directed for 6 days 08/10/17   Ronnell Freshwater, NP  pregabalin (LYRICA) 50 MG capsule Take 100 mg by mouth 3 (three) times daily.     [provider]  senna (SENOKOT) 8.6 MG tablet Take by mouth.    [provider]  SYRINGE-NEEDLE, DISP, 3 ML (MONOJECT 3CC SYR 22GX1") 22G X 1" 3 ML MISC Use as directed. 02/17/15   [provider]  testosterone cypionate (DEPOTESTOTERONE CYPIONATE) 100 MG/ML injection Inject 0.75 mLs (75 mg total) into the muscle every 7 (seven) days. For IM use only 11/28/17   Scar[place], Audie Clear, NP  TIROSINT 150 MCG CAPS TAKE ONE CAPSULE BY MOUTH EVERY MORNING FOR HYPOTHYROIDISM ONE-HALF HOUR BEFORE A MEAL 07/19/17   Ronnell Freshwater, NP     ALLERGIES:  Allergies  Allergen Reactions  . Amoxicillin Anaphylaxis    REACTION: unspecified  . Penicillin G Anaphylaxis  . Gabapentin Other (See Comments)    REACTION: anaphylaxis  . Penicillins     REACTION: unspecified  . Adhesive [Tape] Rash    Band-aid     SOCIAL HISTORY:  Social History   Socioeconomic History  . Marital status: Married    Spouse name: Not on file  . Number of children: Not on file  . Years of education: Not on file  . Highest education level: Not on file  Occupational History  . Not on file  Social Needs  . Financial resource strain: Not on file  . Food insecurity:    Worry: Not on file    Inability: Not on file  . Transportation needs:     Medical: Not on file    Non-medical: Not on file  Tobacco Use  . Smoking status: Former Smoker    Packs/day: 1.00    Years: 30.00    Pack years: 30.00    Types: Cigarettes    Last attempt to quit: 02/22/2005    Years since quitting: 12.7  . Smokeless tobacco: Never Used  Substance and Sexual Activity  . Alcohol use: Yes    Alcohol/week: 0.0 standard drinks    Comment: occasionally  . Drug  use: No    Comment: past  . Sexual activity: Yes  Lifestyle  . Physical activity:    Days per week: Not on file    Minutes per session: Not on file  . Stress: Not on file  Relationships  . Social connections:    Talks on phone: Not on file    Gets together: Not on file    Attends religious service: Not on file    Active member of club or organization: Not on file    Attends meetings of clubs or organizations: Not on file    Relationship status: Not on file  . Intimate partner violence:    Fear of current or ex partner: Not on file    Emotionally abused: Not on file    Physically abused: Not on file    Forced sexual activity: Not on file  Other Topics Concern  . Not on file  Social History Narrative  . Not on file    The patient currently resides (home / rehab facility / nursing home): Home The patient normally is (ambulatory / bedbound): Ambulatory   FAMILY HISTORY:  Family History  Problem Relation Age of Onset  . Hypertension Mother   . Lung disease Mother   . Heart attack Father   . Diabetes Father      REVIEW OF SYSTEMS:  Constitutional: denies weight loss, fever, chills, or sweats  Eyes: denies any other vision changes, history of eye injury  ENT: denies sore throat, hearing problems  Respiratory: denies shortness of breath, wheezing  Cardiovascular: denies chest pain, palpitations  Gastrointestinal: abdominal pain, N/V, and bowel function as per HPI Genitourinary: denies burning with urination or urinary frequency Musculoskeletal: denies any other joint pains or  cramps  Skin: denies any other rashes or skin discolorations  Neurological: denies any other headache, dizziness, weakness  Psychiatric: denies any other depression, anxiety   All other review of systems were negative   VITAL SIGNS:  Temp:  [98.1 F (36.7 C)] 98.1 F (36.7 C) (10/13 1525) Pulse Rate:  [67-86] 76 (10/13 1800) Resp:  [0-25] 25 (10/13 1800) BP: (104-124)/(63-67) 112/67 (10/13 1800) SpO2:  [96 %-99 %] 99 % (10/13 1800) Weight:  [95.3 kg] 95.3 kg (10/13 1525)     Height: 6' (182.9 cm) Weight: 95.3 kg BMI (Calculated): 28.47   INTAKE/OUTPUT:  This shift: No intake/output data recorded.  Last 2 shifts: @IOLAST2SHIFTS @   PHYSICAL EXAM:  Constitutional:  -- Normal body habitus  -- Awake, alert, and oriented x3, no apparent distress Eyes:  -- Pupils equally round and reactive to light  -- No scleral icterus, B/L no occular discharge Ear, nose, throat: -- Neck is FROM WNL -- No jugular venous distension  Pulmonary:  -- No wheezes or rhales -- Equal breath sounds bilaterally -- Breathing non-labored at rest Cardiovascular:  -- S1, S2 present  -- No pericardial rubs  Gastrointestinal:  -- Abdomen soft and non-distended with moderate RLQ > suprapubic and LLQ abdominal tenderness to palpation, no epigastric tenderness to palpation, no guarding or rebound tenderness -- No abdominal masses appreciated, pulsatile or otherwise  Musculoskeletal and Integumentary:  -- Wounds or skin discoloration: None appreciated except umbilical and Right tragal piercings -- Extremities: B/L UE and LE FROM, hands and feet warm, no edema  Neurologic:  -- Motor function: Intact and symmetric -- Sensation: Intact and symmetric Psychiatric:  -- Mood and affect WNL  Labs:  CBC Latest Ref Rng & Units 12/04/2017 10/31/2017 09/27/2017  WBC 4.0 -  10.5 K/uL 17.1(H) 6.3 6.1  Hemoglobin 13.0 - 17.0 g/dL 12.9(L) 11.6(L) 12.7(L)  Hematocrit 39.0 - 52.0 % 40.3 36.1(L) 38.9  Platelets 150 - 400 K/uL  185 260 -   CMP Latest Ref Rng & Units 12/04/2017 11/25/2017 09/27/2017  Glucose 70 - 99 mg/dL 86 - 97  BUN 6 - 20 mg/dL 20 - 8  Creatinine 0.61 - 1.24 mg/dL 1.16 1.00 1.14  Sodium 135 - 145 mmol/L 138 - 145(H)  Potassium 3.5 - 5.1 mmol/L 3.8 - 4.1  Chloride 98 - 111 mmol/L 101 - 105  CO2 22 - 32 mmol/L 27 - 27  Calcium 8.9 - 10.3 mg/dL 8.2(L) - 8.7  Total Protein 6.5 - 8.1 g/dL 6.7 - 6.4  Total Bilirubin 0.3 - 1.2 mg/dL 0.8 - 0.4  Alkaline Phos 38 - 126 U/L 64 - 73  AST 15 - 41 U/L 20 - 22  ALT 0 - 44 U/L 20 - 22   Imaging studies:  CT Abdomen and Pelvis with Contrast (12/04/2017) - personally reviewed, compared with prior study, and discussed with patient 1. Acute appendicitis.  No free air.  No abscess. 2. Minimal sigmoid diverticulosis. 3.  Aortic Atherosclerosis  CT Abdomen and Pelvis with Contrast (11/25/2017) - personally reviewed and discussed with patient No evidence of acute abnormality within the solid abdominal organs. No evidence of small bowel obstruction significant inflammatory changes.  Assessment/Plan: (ICD-10's: K35.30) 57 y.o. male with acute appendicitis, complicated by pertinent comorbidities including HTN, HLD, CAD s/p MI (2006), asthma, GERD with Barrett esophagus, IBS, hypothyroidism, osteoarthritis with chronic pain s/p spine stimulator, nephrolithiasis, seizure disorder, and recent anemia of unclear etiology for which patient has been evaluated by hematology with relatively recent colonoscopy (family history colon cancer).   - NPO, IV fluids   - ciprofloxacin and metronidazole (PCN allergy with hives)  - all risks, benefits, and alternatives to appendectomy were discussed with the patient and his partner/spouse, all of their questions were answered to their expressed satisfaction, patient expresses he wishes to proceed, and informed consent was obtained and documented.  - will plan for laparoscopic appendectomy tonight pending anesthesia and OR  availability  All of the above findings and recommendations were discussed with the patient and his partner/spouse, and all of patient's and his family's questions were answered to their expressed satisfaction.  Thank you for the opportunity to participate in this patient's care.   -- Marilynne Drivers Rosana Hoes, MD, [place]: [facility] - Partnering for exceptional care. Office: [phone]

## 2017-12-04 NOTE — Transfer of Care (Signed)
Immediate Anesthesia Transfer of Care Note  Patient: Randy Mclean  Procedure(s) Performed: APPENDECTOMY LAPAROSCOPIC (N/A )  Patient Location: PACU  Anesthesia Type:General  Level of Consciousness: awake, alert  and oriented  Airway & Oxygen Therapy: Patient Spontanous Breathing  Post-op Assessment: Report given to RN  Post vital signs: Reviewed and stable  Last Vitals:  Vitals Value Taken Time  BP 103/56 12/04/2017  9:25 PM  Temp 37 C 12/04/2017  9:25 PM  Pulse 90 12/04/2017  9:25 PM  Resp 20 12/04/2017  9:25 PM  SpO2 100 % 12/04/2017  9:25 PM    Last Pain:  Vitals:   12/04/17 1807  TempSrc:   PainSc: 6          Complications: No apparent anesthesia complications

## 2017-12-04 NOTE — Op Note (Signed)
SURGICAL OPERATIVE REPORT  DATE OF PROCEDURE: 12/04/2017  ATTENDING Surgeon(s): Vickie Epley, MD  ANESTHESIA: GETA (General)  PRE-OPERATIVE DIAGNOSIS: Acute non-perforated appendicitis with localized peritonitis (K35.30)  POST-OPERATIVE DIAGNOSIS: Acute non-perforated appendicitis with localized peritonitis (K35.30)  PROCEDURE(S):  1.) Laparoscopic appendectomy (cpt: 15726)  INTRAOPERATIVE FINDINGS: Moderately inflamed and friable, firmly thickened non-perforated appendix  INTRAVENOUS FLUIDS: 1400 mL crystalloid   ESTIMATED BLOOD LOSS: Minimal (<20 mL)  URINE OUTPUT: 300 mL   SPECIMENS: Appendix  IMPLANTS: None  DRAINS: None  COMPLICATIONS: None apparent  CONDITION AT END OF PROCEDURE: Hemodynamically stable and extubated  DISPOSITION OF PATIENT: PACU  INDICATIONS FOR PROCEDURE:  Patient is a 57 y.o. male who presented with worsening Right lower quadrant abdominal pain. CT imaging demonstrated acute non-perforated appendicitis, and WBC was 17.1. All risks, benefits, and alternatives to appendectomy were discussed with the patient and his husband, all of patient's and his husband's questions were answered to their expressed satisfaction, and informed consent was obtained and documented.  DETAILS OF PROCEDURE: Patient was brought to the operating suite and appropriately identified. General anesthesia was administered along with confirmation of appropriate pre-operative antibiotics, and endotracheal intubation was performed by anesthetist, along with NG/OG tube for gastric decompression. In supine position, operative site was prepped and draped in usual sterile fashion, and following a brief time out, initial 5 mm incision was made in a natural skin crease just below the umbilicus. Fascia was then elevated, and a Verress needle was inserted and its proper position confirmed using saline meniscus test prior to abdominal insufflation.  Upon insufflation of the abdominal  cavity with carbon dioxide to a well-tolerated pressure of 12-15 mmHg, a 5 mm peri-umbilical port followed by laparoscope were inserted and used to inspect the abdominal cavity and its contents with no injuries from insertion of the first trochar noted. Two additional trocars were inserted, a 12 mm port at the Left lower quadrant position and another 5 mm port at the suprapubic position. The table was then placed in Trendelenburg position with the Right side up, and blunt graspers were gently used to retract the bowel overlying a clearly inflamed appendix surrounded by moderate peri-appendiceal inflammation. The appendix was gently retracted by near its tip, and the base of the appendix and mesoappendix were identified in relation to the cecum. The mesoappendix was dissected from the visceral appendix and hemostasis achieved using a harmonic scalpel. Upon freeing the visceral appendix from the mesoappendix, an endostapler loaded with a standard blue tissue load was advanced across the base of the visceral appendix, which was compressed for several seconds, and the stapler was deployed and removed from the abdominal cavity. Hemostasis was confirmed, and the specimen was extracted from the abdominal cavity in a laparoscopic specimen bag.  The intraperitoneal cavity was inspected with no additional findings. PMI laparoscopic fascial closure device was then used to re-approximate fascia at the 12 mm Left lower quadrant port site. All ports were then removed under direct visualization, and the abdominal cavity was desuflated. All port sites were irrigated/cleaned, additional local anesthetic was injected at each incision, 3-0 Vicryl was used to re-approximate dermis at 12 mm port site(s), and subcuticular 4-0 Monocryl suture was used to re-approximate skin. Skin was then cleaned, dried, and sterile skin glue was applied. Patient was then safely able to be awakened, extubated, and transferred to PACU for post-operative  monitoring and care.   I was present for all aspects of the above procedure, and no operative complications were apparent.

## 2017-12-04 NOTE — Progress Notes (Signed)
Spoke with patient by telephone.  His abdominal pain that I saw him for on 11/25/17 had resolved without intervention.  Now he is reporting it has resurfaced in the last two days and he has had nausea, constipation, headache and fever.  He reports his abdomen is sensitive to touch.  I advised patient to go directly to the emergency room at this time.  He has first degree relatives with colon cancer, and he has had multiple pre-cancerous polyps removed during his own colonoscopies.  We discussed possibility of diverticulitis or another inflammatory bowel condition.  Pt verbalized understanding.

## 2017-12-04 NOTE — ED Provider Notes (Signed)
[facility] Emergency Department Provider Note  ____________________________________________   I have reviewed the triage vital signs and the nursing notes.   HISTORY  Chief Complaint Abdominal Pain   History limited by: Not Limited   HPI Randy Mclean is a 57 y.o. male who presents to the emergency department today because of concern for continued abdominal pain. The patient states that he has had the pain on and off for roughly 10 days. Saw his pcp 10 days ago underwent an CT scan which did not show any acute findings.  Patient states that since then he has had the pain on and off.  Is located in the right lower quadrant.  He has had some nausea.  He denies any vomiting.  Has had some loose nonbloody stool.  Patient feels like he has had some fevers and chills.  He denies any surgeries in his abdomen.  Per medical record review patient has a history of CT scan 10 days ago without acute findings.   Past Medical History:  Diagnosis Date  . Allergy    takes allergy shots  . Anemia   . Arthritis   . Asthma   . Barrett esophagus   . Bronchitis   . Chronic pain   . Colon polyp   . Depression   . GERD (gastroesophageal reflux disease)   . Hemorrhoid   . Hyperlipidemia   . Hypertension   . Hypothyroid   . IBS (irritable bowel syndrome)   . Kidney stone   . MI (myocardial infarction) ([place])   . Migraine   . Pneumonia   . Seizures ([place])   . Sinus problem   . Spinal cord stimulator status     Patient Active Problem List   Diagnosis Date Noted  . Inflammatory polyarthritis ([place]) 07/27/2017  . Other fatigue 07/27/2017  . Vitamin B12 deficiency 07/27/2017  . Impaired fasting glucose 07/27/2017  . Attention and concentration deficit 07/27/2017  . Iron deficiency anemia 09/17/2014  . Angiolipoma of skin 02/22/2014  . Neuroma of leg 12/27/2012  . Leg mass 11/23/2012  . Chest pain 05/01/2012  . Hypertension 10/16/2008  . BACK PAIN WITH  RADICULOPATHY 06/20/2007  . FATIGUE 10/31/2006  . BENIGN PROSTATIC HYPERTROPHY 09/22/2005  . PROSTATITIS, CHRONIC 09/22/2005  . HYPERGLYCEMIA 09/22/2005  . HYPERLIPIDEMIA 04/22/2004  . GERD 04/22/2004  . CARPAL TUNNEL SYNDROME, BILATERAL 11/23/2003  . LATERAL EPICONDYLITIS, BILATERAL 11/23/2003  . Anxiety state 09/23/1999  . DEPRESSION 02/23/1999  . Hypothyroidism 02/23/1991  . DISORDER, TOBACCO USE 02/22/1978    Past Surgical History:  Procedure Laterality Date  . BACK SURGERY    . COLONOSCOPY  August 2015  . elbow surgery    . HEMORRHOID SURGERY    . LIPOMA EXCISION Right    leg  . 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 SPINAL CORD STIMULATOR IMPLANT    . UPPER GI ENDOSCOPY  August 2015    Prior to Admission medications   Medication Sig Start Date End Date Taking? Authorizing Provider  albuterol (PROVENTIL HFA;VENTOLIN HFA) 108 (90 Base) MCG/ACT inhaler Frequency:PHARMDIR   Dosage:90   MCG  Instructions:  Note:Dose: 46 MCG 12/22/11   [provider]  alfuzosin (UROXATRAL) 10 MG 24 hr tablet Take 10 mg by mouth daily.    [provider]  ALPRAZolam Duanne Moron) 1 MG tablet Take 1 tablet (1 mg total) by mouth 3 (three) times daily as needed for anxiety. 06/30/17   Ronnell Freshwater, NP  amphetamine-dextroamphetamine (ADDERALL) 30 MG tablet Take 1 tablet by mouth 3 (three) times daily. 10/13/17   Kendell Bane, NP  atorvastatin (LIPITOR) 10 MG tablet Take 1 tablet (10 mg total) by mouth daily. 09/20/17   Ronnell Freshwater, NP  azelastine (ASTELIN) 0.1 % nasal spray Place 1 spray into both nostrils 2 (two) times daily. 02/10/17   Ronnell Freshwater, NP  buPROPion (WELLBUTRIN XL) 300 MG 24 hr tablet Take 1 tablet (300 mg total) by mouth daily. 10/10/17   Ronnell Freshwater, NP  citalopram (CELEXA) 20 MG tablet Take 1 tablet (20 mg total) by mouth daily. 11/07/17   Kendell Bane, NP  diphenhydrAMINE (SOMINEX) 25 MG tablet Take 25 mg by mouth as needed for sleep.     [provider]  esomeprazole (NEXIUM) 40 MG capsule TK ONE C PO  BID UTD 03/15/16   [provider]  l-methylfolate-B6-B12 (METANX) 3-35-2 MG TABS Take 1 tablet by mouth 2 (two) times daily.    [provider]  losartan (COZAAR) 50 MG tablet Take 1 tablet (50 mg total) by mouth daily. 09/20/17   Ronnell Freshwater, NP  meloxicam (MOBIC) 7.5 MG tablet Take 1 tablet (7.5 mg total) by mouth daily. 03/31/17   Ronnell Freshwater, NP  metoprolol succinate (TOPROL-XL) 25 MG 24 hr tablet Take 1 tablet (25 mg total) by mouth daily. 09/20/17   Ronnell Freshwater, NP  mometasone-formoterol (DULERA) 100-5 MCG/ACT AERO Inhale 2 puffs into the lungs every 12 (twelve) hours.    [provider]  Multiple Vitamin (MULTI-VITAMINS) TABS Take by mouth.    [provider]  NUCYNTA ER 200 MG TB12 200 mg bid 01/09/16   [provider]  oxyCODONE-acetaminophen (PERCOCET) 7.5-325 MG per tablet Take 1 tablet by mouth every 4 (four) hours as needed for pain.     [provider]  predniSONE (STERAPRED UNI-PAK 21 TAB) 10 MG (21) TBPK tablet 6 day taper - take by mouth as directed for 6 days 08/10/17   Ronnell Freshwater, NP  pregabalin (LYRICA) 50 MG capsule Take 100 mg by mouth 3 (three) times daily.     [provider]  senna (SENOKOT) 8.6 MG tablet Take by mouth.    [provider]  SYRINGE-NEEDLE, DISP, 3 ML (MONOJECT 3CC SYR 22GX1") 22G X 1" 3 ML MISC Use as directed. 02/17/15   [provider]  testosterone cypionate (DEPOTESTOTERONE CYPIONATE) 100 MG/ML injection Inject 0.75 mLs (75 mg total) into the muscle every 7 (seven) days. For IM use only 11/28/17   [place], Audie Clear, NP  TIROSINT 150 MCG CAPS TAKE ONE CAPSULE BY MOUTH EVERY MORNING FOR HYPOTHYROIDISM ONE-HALF HOUR BEFORE A MEAL 07/19/17   Ronnell Freshwater, NP    Allergies Amoxicillin; Penicillin g; Gabapentin; Penicillins; and Adhesive [tape]  Family History  Problem Relation  Age of Onset  . Hypertension Mother   . Lung disease Mother   . Heart attack Father   . Diabetes Father     Social History Social History   Tobacco Use  . Smoking status: Former Smoker    Packs/day: 1.00    Years: 30.00    Pack years: 30.00    Types: Cigarettes    Last attempt to quit: 02/22/2005    Years since quitting: 12.7  . Smokeless tobacco: Never Used  Substance Use Topics  . Alcohol use: Yes    Alcohol/week: 0.0 standard drinks    Comment: occasionally  . Drug use:  No    Comment: past    Review of Systems Constitutional: No fever/chills Eyes: No visual changes. ENT: No sore throat. Cardiovascular: Denies chest pain. Respiratory: Denies shortness of breath. Gastrointestinal: Positive for right lower quadrant pain.  Genitourinary: Negative for dysuria. Musculoskeletal: Negative for back pain. Skin: Negative for rash. Neurological: Negative for headaches, focal weakness or numbness.  ____________________________________________   PHYSICAL EXAM:  VITAL SIGNS: ED Triage Vitals [12/04/17 1525]  Enc Vitals Group     BP 124/63     Pulse Rate 86     Resp 16     Temp 98.1 F (36.7 C)     Temp Source Oral     SpO2 96 %     Weight 210 lb (95.3 kg)     Height 6' (1.829 m)     Head Circumference      Peak Flow      Pain Score 6   Constitutional: Alert and oriented.  Eyes: Conjunctivae are normal.  ENT      Head: Normocephalic and atraumatic.      Nose: No congestion/rhinnorhea.      Mouth/Throat: Mucous membranes are moist.      Neck: No stridor. Hematological/Lymphatic/Immunilogical: No cervical lymphadenopathy. Cardiovascular: Normal rate, regular rhythm.  No murmurs, rubs, or gallops. Respiratory: Normal respiratory effort without tachypnea nor retractions. Breath sounds are clear and equal bilaterally. No wheezes/rales/rhonchi. Gastrointestinal: Soft and tender to palpation in the right lower quadrant. Positive rosvigns.  Genitourinary:  Deferred Musculoskeletal: Normal range of motion in all extremities. No lower extremity edema. Neurologic:  Normal speech and language. No gross focal neurologic deficits are appreciated.  Skin:  Skin is warm, dry and intact. No rash noted. Psychiatric: Mood and affect are normal. Speech and behavior are normal. Patient exhibits appropriate insight and judgment.  ____________________________________________    LABS (pertinent positives/negatives)  Lipase 21 CMP wnl except ca 8.2 CBC wbc 17.1, hgb 12.9, plt 185 UA hazy, protein 30, rbc and wbc 0-5, few bacteria  ____________________________________________   EKG  None  ____________________________________________    RADIOLOGY  CT abd/pel Acute appendicitis  ____________________________________________   PROCEDURES  Procedures  ____________________________________________   INITIAL IMPRESSION / ASSESSMENT AND PLAN / ED COURSE  Pertinent labs & imaging results that were available during my care of the patient were reviewed by me and considered in my medical decision making (see chart for details).   Patient presented to the emergency department today because of concerns for right lower quadrant pain.  Has been happening on and off for the past 8 days.  Had a negative CT 10 days ago.  Today however patient continues to have right lower quadrant pain.  He is quite tender and has positive Rovsing sign.  Leukocytosis.  CT abdomen pelvis was performed which did show acute appendicitis.  Discussed with surgery who will come evaluate.  ____________________________________________   FINAL CLINICAL IMPRESSION(S) / ED DIAGNOSES  Final diagnoses:  Right lower quadrant abdominal pain  Acute appendicitis, unspecified acute appendicitis type     Note: This dictation was prepared with Dragon dictation. Any transcriptional errors that result from this process are unintentional     Nance Pear, MD 12/04/17 [phone]

## 2017-12-04 NOTE — Anesthesia Post-op Follow-up Note (Signed)
Anesthesia QCDR form completed.        

## 2017-12-04 NOTE — ED Triage Notes (Signed)
Pt c/o RLQ pain intermittently with nausea for the past 10 days and was seen at PCP and had an outpatient CT that he says was negative but the sx are worsening.Marland Kitchen

## 2017-12-05 ENCOUNTER — Other Ambulatory Visit: Payer: Self-pay

## 2017-12-05 MED ORDER — ONDANSETRON HCL 4 MG PO TABS: 4.0000 mg | ORAL_TABLET | Freq: Three times a day (TID) | ORAL | 0 refills | Status: DC | PRN

## 2017-12-05 MED ORDER — INFLUENZA VAC SPLIT QUAD 0.5 ML IM SUSY
0.5000 mL | PREFILLED_SYRINGE | INTRAMUSCULAR | Status: AC
  Administered 2017-12-05: 0.5 mL via INTRAMUSCULAR
  Filled 2017-12-05: qty 0.5

## 2017-12-05 MED ORDER — INFLUENZA VAC SPLIT QUAD 0.5 ML IM SUSY
0.5000 mL | PREFILLED_SYRINGE | INTRAMUSCULAR | Status: DC
  Filled 2017-12-05: qty 0.5

## 2017-12-05 NOTE — Progress Notes (Signed)
[facility]  A and O x 4. VSS. Pt tolerating diet well. No complaints of pain or nausea. IV removed intact, prescriptions given. Pt voiced understanding of discharge instructions with no further questions. Pt discharged via wheelchair with axillary.      Vitals:   12/05/17 0748 12/05/17 1148  BP: (!) 110/54 128/61  Pulse: 73 72  Resp:  17  Temp:  97.7 F (36.5 C)  SpO2:  97%    Francesco Sor

## 2017-12-05 NOTE — Discharge Summary (Addendum)
Discharge Summary  Patient ID: Randy Mclean Younger MRN: [id number] DOB/AGE: Mar 24, 1960 57 y.o.  Admit date: 12/04/2017 Discharge date: 12/05/2017  Discharge Diagnoses Acute Appendicitis  Consultants None   Procedures Laparoscopic Appendectomy on 12/04/17 with Dr. Rosana Hoes, MD  HPI: 57 y.o. male presented to [facility] ED on 12/04/17  for evaluation of abdominal pain. Patient reports he began experiencing RLQ abdominal pain ~18 days ago in the vicinity of his spinal cord stimulator, for which he was seen by his primary care NP, who ordered a CT abdomen/pelvic, which did not reveal any acute abnormalities. Since then, he experienced some diarrhea a few days ago, becoming more "shredded" BM yesterday with a single episode of night sweat last night. Patient otherwise reports nausea without emesis, denies CP or SOB despite MI and COPD diagnosed 2006, around which time patient says he quit smoking after his father was diagnosed and died from smoking-associated lung cancer. Patient specifically says he is able to walk >2 blocks and up/down a flight of steps without CP or SOB and does not take aspirin or Plavix. He likewise denies any prior open abdominal surgeries and has eaten solid food since yesterday, drank a little water earlier today.  Hospital Course: He underwent laparoscopic appendectomy on 12/04/17 with Dr. Rosana Hoes and was admitted to general surgery for post-operative pain management. On HD1 (12/05/17), his pain was much improved and he was very apprerciative to staff. He was able to tolerate PO intake with improvement in his post-anesthesia nausea.   On the day of discharge (12/05/17), his pain was improved, he was tolerating a diet, and he was mobilizing appropriately. He was educated on post-operative restrictions and follow up appointments. All of his questions and concerns were addressed and answered.   Physical Exam:  Const: Well appearing male, NAD HEENT: No scleral icterus, PERRL Pulm:  CTAB Card: HRRR, no m/r/g Abd: Soft, tenderness worse in RLQ and LLQ near incisions, non-distended, no rebound or guarding MSK: no peripheral edema.     Allergies as of 12/05/2017      Reactions   Amoxicillin Anaphylaxis   REACTION: unspecified   Penicillin G Anaphylaxis   Gabapentin Other (See Comments)   REACTION: anaphylaxis   Penicillins    REACTION: unspecified   Adhesive [tape] Rash   Band-aid      Medication List    TAKE these medications   albuterol 108 (90 Base) MCG/ACT inhaler Commonly known as:  PROVENTIL HFA;VENTOLIN HFA Frequency:PHARMDIR   Dosage:90   MCG  Instructions:  Note:Dose: 90 MCG   alfuzosin 10 MG 24 hr tablet Commonly known as:  UROXATRAL Take 10 mg by mouth daily.   ALPRAZolam 1 MG tablet Commonly known as:  XANAX Take 1 tablet (1 mg total) by mouth 3 (three) times daily as needed for anxiety.   amphetamine-dextroamphetamine 30 MG tablet Commonly known as:  ADDERALL Take 1 tablet by mouth 3 (three) times daily.   atorvastatin 10 MG tablet Commonly known as:  LIPITOR Take 1 tablet (10 mg total) by mouth daily.   azelastine 0.1 % nasal spray Commonly known as:  ASTELIN Place 1 spray into both nostrils 2 (two) times daily.   buPROPion 300 MG 24 hr tablet Commonly known as:  WELLBUTRIN XL Take 1 tablet (300 mg total) by mouth daily.   citalopram 20 MG tablet Commonly known as:  CELEXA Take 1 tablet (20 mg total) by mouth daily.   diphenhydrAMINE 25 MG tablet Commonly known as:  SOMINEX Take 25 mg by mouth  as needed for sleep.   esomeprazole 40 MG capsule Commonly known as:  NEXIUM TK ONE C PO  BID UTD   l-methylfolate-B6-B12 3-35-2 MG Tabs tablet Commonly known as:  METANX Take 1 tablet by mouth 2 (two) times daily.   losartan 50 MG tablet Commonly known as:  COZAAR Take 1 tablet (50 mg total) by mouth daily.   meloxicam 7.5 MG tablet Commonly known as:  MOBIC Take 1 tablet (7.5 mg total) by mouth daily.   metoprolol  succinate 25 MG 24 hr tablet Commonly known as:  TOPROL-XL Take 1 tablet (25 mg total) by mouth daily.   mometasone-formoterol 100-5 MCG/ACT Aero Commonly known as:  DULERA Inhale 2 puffs into the lungs every 12 (twelve) hours.   MONOJECT 3CC SYR 22GX1" 22G X 1" 3 ML Misc Generic drug:  SYRINGE-NEEDLE (DISP) 3 ML Use as directed.   MULTI-VITAMINS Tabs Take by mouth.   NUCYNTA ER 200 MG Tb12 Generic drug:  Tapentadol HCl 200 mg bid   ondansetron 4 MG tablet Commonly known as:  ZOFRAN Take 1 tablet (4 mg total) by mouth every 8 (eight) hours as needed for nausea or vomiting.   oxyCODONE-acetaminophen 7.5-325 MG tablet Commonly known as:  PERCOCET Take 1 tablet by mouth every 4 (four) hours as needed for pain.   pregabalin 50 MG capsule Commonly known as:  LYRICA Take 100 mg by mouth 3 (three) times daily.   senna 8.6 MG tablet Commonly known as:  SENOKOT Take by mouth.   testosterone cypionate 100 MG/ML injection Commonly known as:  DEPOTESTOTERONE CYPIONATE Inject 0.75 mLs (75 mg total) into the muscle every 7 (seven) days. For IM use only   TIROSINT 150 MCG Caps Generic drug:  Levothyroxine Sodium TAKE ONE CAPSULE BY MOUTH EVERY MORNING FOR HYPOTHYROIDISM ONE-HALF HOUR BEFORE A MEAL        Follow-up Information    Vickie Epley, MD. Schedule an appointment as soon as possible for a visit in 2 week(s).   Specialty:  General Surgery Contact information: [address] [place] [address] [phone]           Signed: Edison Simon , PA-C [facility]  12/05/2017, 8:55 AM [phone] M-F: 7am - 4pm

## 2017-12-06 ENCOUNTER — Encounter: Payer: Self-pay | Admitting: Surgery

## 2017-12-06 LAB — SURGICAL PATHOLOGY

## 2017-12-15 ENCOUNTER — Ambulatory Visit (INDEPENDENT_AMBULATORY_CARE_PROVIDER_SITE_OTHER): Payer: BLUE CROSS/BLUE SHIELD | Admitting: Surgery

## 2017-12-15 ENCOUNTER — Encounter: Payer: Self-pay | Admitting: Surgery

## 2017-12-15 VITALS — BP 181/84 | HR 88 | Temp 97.9°F | Resp 14 | Ht 74.0 in | Wt 213.0 lb

## 2017-12-15 DIAGNOSIS — K3580 Unspecified acute appendicitis: Secondary | ICD-10-CM

## 2017-12-15 DIAGNOSIS — Z4889 Encounter for other specified surgical aftercare: Secondary | ICD-10-CM

## 2017-12-15 NOTE — Patient Instructions (Signed)
Patient to return as needed. The patient is aware to call back for any questions or concerns. 

## 2017-12-15 NOTE — Progress Notes (Signed)
Surgical Clinic Progress/Follow-up Note   HPI:  57 y.o. Male presents to clinic for post-op follow-up 11 Days s/p laparoscopic appendectomy Rosana Hoes, 12/04/2017). Patient reports nearly complete resolution of peri-operative pain and has been tolerating regular diet with +flatus and normal BM's, denies N/V, fever/chills, CP, or SOB. Of note, he also adds he has been applying Vitamin E to his wounds.  Review of Systems:  Constitutional: denies fever/chills  Respiratory: denies shortness of breath, wheezing  Cardiovascular: denies chest pain, palpitations  Gastrointestinal: abdominal pain, N/V, and bowel function as per interval history Skin: Denies any other rashes or skin discolorations except post-surgical wounds as per interval history  Vital Signs:  BP (!) 181/84   Pulse 88   Temp 97.9 F (36.6 C) (Skin)   Resp 14   Ht 6\' 2"  (1.88 m)   Wt 213 lb (96.6 kg)   BMI 27.35 kg/m    Physical Exam:  Constitutional:  -- Normal body habitus  -- Awake, alert, and oriented x3  Pulmonary:  -- No crackles -- Equal breath sounds bilaterally -- Breathing non-labored at rest Cardiovascular:  -- S1, S2 present  -- No pericardial rubs  Gastrointestinal:  -- Soft and non-distended, non-tender to palpation, no guarding/rebound tenderness -- Post-surgical incisions all very well-approximated (barely noticeable) without any peri-incisional erythema or drainage -- No abdominal masses appreciated, pulsatile or otherwise  Musculoskeletal / Integumentary:  -- Wounds or skin discoloration: None appreciated except post-surgical incisions as described above (GI) -- Extremities: B/L UE and LE FROM, hands and feet warm, no edema   Imaging: No new pertinent imaging available for review  Assessment:  57 y.o. yo Male with a problem list including...  Patient Active Problem List   Diagnosis Date Noted  . Acute appendicitis 12/04/2017  . Inflammatory polyarthritis ([place]) 07/27/2017  . Other fatigue  07/27/2017  . Vitamin B12 deficiency 07/27/2017  . Impaired fasting glucose 07/27/2017  . Attention and concentration deficit 07/27/2017  . Iron deficiency anemia 09/17/2014  . Angiolipoma of skin 02/22/2014  . Neuroma of leg 12/27/2012  . Leg mass 11/23/2012  . Chest pain 05/01/2012  . Hypertension 10/16/2008  . BACK PAIN WITH RADICULOPATHY 06/20/2007  . FATIGUE 10/31/2006  . BENIGN PROSTATIC HYPERTROPHY 09/22/2005  . PROSTATITIS, CHRONIC 09/22/2005  . HYPERGLYCEMIA 09/22/2005  . HYPERLIPIDEMIA 04/22/2004  . GERD 04/22/2004  . CARPAL TUNNEL SYNDROME, BILATERAL 11/23/2003  . LATERAL EPICONDYLITIS, BILATERAL 11/23/2003  . Anxiety state 09/23/1999  . DEPRESSION 02/23/1999  . Hypothyroidism 02/23/1991  . DISORDER, TOBACCO USE 02/22/1978    presents to clinic for post-op follow-up evaluation, doing well 11 Days s/p laparoscopic appendectomy Rosana Hoes, 12/04/2017) for acute appendicitis.  Plan:              - advance diet as tolerated              - okay to submerge incisions under water (baths, swimming) prn             - gradually resume all activities without restrictions over next 2 weeks             - apply sunblock particularly to incisions with sun exposure to reduce pigmentation of scars             - return to clinic as needed, instructed to call office if any questions or concerns  All of the above recommendations were discussed with the patient, and all of patient's questions were answered to his expressed satisfaction.  -- Marilynne Drivers Rosana Hoes,  MD, RPVI [place]: [facility] for exceptional care. Office: [phone]

## 2017-12-19 NOTE — Anesthesia Postprocedure Evaluation (Signed)
Anesthesia Post Note  Patient: Randy Mclean  Procedure(s) Performed: APPENDECTOMY LAPAROSCOPIC (N/A )  Patient location during evaluation: PACU Anesthesia Type: General Level of consciousness: awake and alert and oriented Pain management: pain level controlled Vital Signs Assessment: post-procedure vital signs reviewed and stable Respiratory status: spontaneous breathing Cardiovascular status: blood pressure returned to baseline Anesthetic complications: no     Last Vitals:  Vitals:   12/05/17 0748 12/05/17 1148  BP: (!) 110/54 128/61  Pulse: 73 72  Resp:  17  Temp:  36.5 C  SpO2:  97%    Last Pain:  Vitals:   12/05/17 1148  TempSrc: Oral  PainSc:                  [name]

## 2018-01-05 ENCOUNTER — Telehealth: Payer: Self-pay

## 2018-01-05 NOTE — Telephone Encounter (Signed)
lmom to call us back

## 2018-01-13 ENCOUNTER — Ambulatory Visit: Payer: Self-pay | Admitting: Adult Health

## 2018-01-13 ENCOUNTER — Encounter: Payer: Self-pay | Admitting: Adult Health

## 2018-01-13 VITALS — BP 156/88 | HR 80 | Resp 16 | Ht 72.0 in | Wt 219.0 lb

## 2018-01-13 DIAGNOSIS — Z79899 Other long term (current) drug therapy: Secondary | ICD-10-CM | POA: Diagnosis not present

## 2018-01-13 DIAGNOSIS — Z1211 Encounter for screening for malignant neoplasm of colon: Secondary | ICD-10-CM

## 2018-01-13 DIAGNOSIS — G47429 Narcolepsy in conditions classified elsewhere without cataplexy: Secondary | ICD-10-CM | POA: Diagnosis not present

## 2018-01-13 DIAGNOSIS — F411 Generalized anxiety disorder: Secondary | ICD-10-CM | POA: Diagnosis not present

## 2018-01-13 DIAGNOSIS — F410 Panic disorder [episodic paroxysmal anxiety] without agoraphobia: Secondary | ICD-10-CM

## 2018-01-13 DIAGNOSIS — N401 Enlarged prostate with lower urinary tract symptoms: Secondary | ICD-10-CM | POA: Diagnosis not present

## 2018-01-13 LAB — POCT URINE DRUG SCREEN
POC Amphetamine UR: POSITIVE — AB
POC BENZODIAZEPINES UR: POSITIVE — AB
POC Barbiturate UR: NOT DETECTED
POC Cocaine UR: NOT DETECTED
POC Ecstasy UR: NOT DETECTED
POC Marijuana UR: NOT DETECTED
POC Methadone UR: NOT DETECTED
POC Methamphetamine UR: NOT DETECTED
POC Opiate Ur: POSITIVE — AB
POC Oxycodone UR: POSITIVE — AB
POC PHENCYCLIDINE UR: NOT DETECTED
POC TRICYCLICS UR: NOT DETECTED

## 2018-01-13 MED ORDER — AMPHETAMINE-DEXTROAMPHETAMINE 30 MG PO TABS: 30.0000 mg | ORAL_TABLET | Freq: Three times a day (TID) | ORAL | 0 refills | Status: DC

## 2018-01-13 MED ORDER — ALFUZOSIN HCL ER 10 MG PO TB24: 10.0000 mg | ORAL_TABLET | Freq: Every day | ORAL | 3 refills | Status: DC

## 2018-01-13 MED ORDER — ALPRAZOLAM 1 MG PO TABS: 1.0000 mg | ORAL_TABLET | Freq: Three times a day (TID) | ORAL | 1 refills | Status: DC | PRN

## 2018-01-13 MED ORDER — AMPHETAMINE-DEXTROAMPHET ER 30 MG PO CP24: 30.0000 mg | ORAL_CAPSULE | ORAL | 0 refills | Status: DC

## 2018-01-13 NOTE — Patient Instructions (Signed)
Amphetamine; Dextroamphetamine extended-release capsules What is this medicine? AMPHETAMINE; DEXTROAMPHETAMINE (am FET a meen; dex troe am FET a meen) is used to treat attention-deficit hyperactivity disorder (ADHD). Federal law prohibits giving this medicine to any person other than the person for whom it was prescribed. Do not share this medicine with anyone else. This medicine may be used for other purposes; ask your health care provider or pharmacist if you have questions. COMMON BRAND NAME(S): Adderall XR, Mydayis What should I tell my health care provider before I take this medicine? They need to know if you have any of these conditions: -anxiety or panic attacks -circulation problems in fingers and toes -glaucoma -hardening or blockages of the arteries or heart blood vessels -heart disease or a heart defect -high blood pressure -history of a drug or alcohol abuse problem -history of stroke -kidney disease -liver disease -mental illness -seizures -suicidal thoughts, plans, or attempt; a previous suicide attempt by you or a family member -thyroid disease -Tourette's syndrome -an unusual or allergic reaction to dextroamphetamine, other amphetamines, other medicines, foods, dyes, or preservatives -pregnant or trying to get pregnant -breast-feeding How should I use this medicine? Take this medicine by mouth with a glass of water. Follow the directions on the prescription label. This medicine is taken just one time per day, usually in the morning after waking up. Take with or without food. Do not chew or crush this medicine. You may open the capsules and sprinkle the medicine on a spoonful of applesauce. If sprinkled on applesauce, take the dose immediately and do not crush or chew. Always drink a glass of water or other liquid after taking this medicine. Do not take your medicine more often than directed. A special MedGuide will be given to you by the pharmacist with each prescription  and refill. Be sure to read this information carefully each time. Talk to your pediatrician regarding the use of this medicine in children. While this drug may be prescribed for children as young as 6 years for selected conditions, precautions do apply. Overdosage: If you think you have taken too much of this medicine contact a poison control center or emergency room at once. NOTE: This medicine is only for you. Do not share this medicine with others. What if I miss a dose? If you miss a dose, take it as soon as you can. If it is almost time for your next dose, take only that dose. Do not take double or extra doses. What may interact with this medicine? Do not take this medicine with any of the following medications: -MAOIs like Carbex, Eldepryl, Marplan, Nardil, and Parnate -other stimulant medicines for attention disorders, weight loss, or to stay awake This medicine may also interact with the following medications: -acetazolamide -ammonium chloride -antacids -ascorbic acid -atomoxetine -caffeine -certain medicines for blood pressure -certain medicines for depression, anxiety, or psychotic disturbances -certain medicines for diabetes -certain medicines for seizures like carbamazepine, phenobarbital, phenytoin -certain medicines for stomach problems like cimetidine, famotidine, omeprazole, lansoprazole -cold or allergy medicines -glutamic acid -lithium -meperidine -methenamine; sodium acid phosphate -narcotic medicines for pain -norepinephrine -phenothiazines like chlorpromazine, mesoridazine, prochlorperazine, thioridazine -sodium bicarbonate This list may not describe all possible interactions. Give your health care provider a list of all the medicines, herbs, non-prescription drugs, or dietary supplements you use. Also tell them if you smoke, drink alcohol, or use illegal drugs. Some items may interact with your medicine. What should I watch for while using this medicine? Visit  your doctor or health care   professional for regular checks on your progress. This prescription requires that you follow special procedures with your doctor and pharmacy. You will need to have a new written prescription from your doctor every time you need a refill. This medicine may affect your concentration, or hide signs of tiredness. Until you know how this medicine affects you, do not drive, ride a bicycle, use machinery, or do anything that needs mental alertness. Tell your doctor or health care professional if this medicine loses its effects, or if you feel you need to take more than the prescribed amount. Do not change the dosage without talking to your doctor or health care professional. Decreased appetite is a common side effect when starting this medicine. Eating small, frequent meals or snacks can help. Talk to your doctor if you continue to have poor eating habits. Height and weight growth of a child taking this medicine will be monitored closely. Do not take this medicine close to bedtime. It may prevent you from sleeping. If you are going to need surgery, an MRI, a CT scan, or other procedure, tell your doctor that you are taking this medicine. You may need to stop taking this medicine before the procedure. Tell your doctor or healthcare professional right away if you notice unexplained wounds on your fingers and toes while taking this medicine. You should also tell your healthcare provider if you experience numbness or pain, changes in the skin color, or sensitivity to temperature in your fingers or toes. What side effects may I notice from receiving this medicine? Side effects that you should report to your doctor or health care professional as soon as possible: -allergic reactions like skin rash, itching or hives, swelling of the face, lips, or tongue -changes in vision -chest pain or chest tightness -confusion, trouble speaking or understanding -fast, irregular heartbeat -fingers or  toes feel numb, cool, painful -hallucination, loss of contact with reality -high blood pressure -males: prolonged or painful erection -seizures -severe headaches -shortness of breath -suicidal thoughts or other mood changes -trouble walking, dizziness, loss of balance or coordination -uncontrollable head, mouth, neck, arm, or leg movements Side effects that usually do not require medical attention (report to your doctor or health care professional if they continue or are bothersome): -anxious -headache -loss of appetite -nausea, vomiting -trouble sleeping -weight loss This list may not describe all possible side effects. Call your doctor for medical advice about side effects. You may report side effects to FDA at 1-800-FDA-1088. Where should I keep my medicine? Keep out of the reach of children. This medicine can be abused. Keep your medicine in a safe place to protect it from theft. Do not share this medicine with anyone. Selling or giving away this medicine is dangerous and against the law. Store at room temperature between 15 and 30 degrees C (59 and 86 degrees F). Keep container tightly closed. Protect from light. Throw away any unused medicine after the expiration date. NOTE: This sheet is a summary. It may not cover all possible information. If you have questions about this medicine, talk to your doctor, pharmacist, or health care provider.  2018 Elsevier/Gold Standard (2013-12-12 18:22:45)  

## 2018-01-13 NOTE — Progress Notes (Signed)
[facility] [place], [place] [address]  Internal MEDICINE  Office Visit Note  Patient Name: Randy Mclean Younger  [id number]  [id number]  Date of Service: 01/16/2018  Chief Complaint  Patient presents with  . Hyperlipidemia    medication refills   . Hypertension  . Anxiety  . Depression    HPI  Pt is here for follow up on HLD, HTN, Anxiety, Narcolepsy and BPH.  Pt denies any new symptoms or issues.  He has been taking his medications as directed.  He is generally doing well, He did have an emergency appendectomy recently, and appears to be recovering well. He is requesting multiple medication refills today.    Current Medication: Outpatient Encounter Medications as of 01/13/2018  Medication Sig  . albuterol (PROVENTIL HFA;VENTOLIN HFA) 108 (90 Base) MCG/ACT inhaler Frequency:PHARMDIR   Dosage:90   MCG  Instructions:  Note:Dose: 37 MCG  . alfuzosin (UROXATRAL) 10 MG 24 hr tablet Take 1 tablet (10 mg total) by mouth daily.  Marland Kitchen ALPRAZolam (XANAX) 1 MG tablet Take 1 tablet (1 mg total) by mouth 3 (three) times daily as needed for anxiety.  Marland Kitchen amphetamine-dextroamphetamine (ADDERALL) 30 MG tablet Take 1 tablet by mouth 3 (three) times daily.  Marland Kitchen atorvastatin (LIPITOR) 10 MG tablet Take 1 tablet (10 mg total) by mouth daily.  Marland Kitchen azelastine (ASTELIN) 0.1 % nasal spray Place 1 spray into both nostrils 2 (two) times daily.  Marland Kitchen buPROPion (WELLBUTRIN XL) 300 MG 24 hr tablet Take 1 tablet (300 mg total) by mouth daily.  . citalopram (CELEXA) 20 MG tablet Take 1 tablet (20 mg total) by mouth daily.  . diphenhydrAMINE (SOMINEX) 25 MG tablet Take 25 mg by mouth as needed for sleep.  Marland Kitchen esomeprazole (NEXIUM) 40 MG capsule TK ONE C PO  BID UTD  . l-methylfolate-B6-B12 (METANX) 3-35-2 MG TABS Take 1 tablet by mouth 2 (two) times daily.  Marland Kitchen losartan (COZAAR) 50 MG tablet Take 1 tablet (50 mg total) by mouth daily.  . meloxicam (MOBIC) 7.5 MG tablet Take 1 tablet (7.5 mg  total) by mouth daily.  . metoprolol succinate (TOPROL-XL) 25 MG 24 hr tablet Take 1 tablet (25 mg total) by mouth daily.  . mometasone-formoterol (DULERA) 100-5 MCG/ACT AERO Inhale 2 puffs into the lungs every 12 (twelve) hours.  . Multiple Vitamin (MULTI-VITAMINS) TABS Take by mouth.  Gean Birchwood ER 200 MG TB12 200 mg bid  . oxyCODONE-acetaminophen (PERCOCET) 7.5-325 MG per tablet Take 1 tablet by mouth every 4 (four) hours as needed for pain.   . pregabalin (LYRICA) 50 MG capsule Take 100 mg by mouth 3 (three) times daily.   Marland Kitchen senna (SENOKOT) 8.6 MG tablet Take by mouth.  . SYRINGE-NEEDLE, DISP, 3 ML (MONOJECT 3CC SYR 22GX1") 22G X 1" 3 ML MISC Use as directed.  . testosterone cypionate (DEPOTESTOTERONE CYPIONATE) 100 MG/ML injection Inject 0.75 mLs (75 mg total) into the muscle every 7 (seven) days. For IM use only  . TIROSINT 150 MCG CAPS TAKE ONE CAPSULE BY MOUTH EVERY MORNING FOR HYPOTHYROIDISM ONE-HALF HOUR BEFORE A MEAL  . [DISCONTINUED] alfuzosin (UROXATRAL) 10 MG 24 hr tablet Take 10 mg by mouth daily.  . [DISCONTINUED] ALPRAZolam (XANAX) 1 MG tablet Take 1 tablet (1 mg total) by mouth 3 (three) times daily as needed for anxiety.  . [DISCONTINUED] ALPRAZolam (XANAX) 1 MG tablet Take 1 tablet (1 mg total) by mouth 3 (three) times daily as needed for anxiety.  . [DISCONTINUED] amphetamine-dextroamphetamine (ADDERALL)  30 MG tablet Take 1 tablet by mouth 3 (three) times daily.  Derrill Memo ON 02/12/2018] amphetamine-dextroamphetamine (ADDERALL) 30 MG tablet Take 1 tablet by mouth 3 (three) times daily.  Derrill Memo ON 03/14/2018] amphetamine-dextroamphetamine (ADDERALL) 30 MG tablet Take 1 tablet by mouth 3 (three) times daily.  . [DISCONTINUED] amphetamine-dextroamphetamine (ADDERALL XR) 30 MG 24 hr capsule Take 1 capsule (30 mg total) by mouth every morning.  . [DISCONTINUED] ondansetron (ZOFRAN) 4 MG tablet Take 1 tablet (4 mg total) by mouth every 8 (eight) hours as needed for nausea or vomiting.  (Patient not taking: Reported on 01/13/2018)   No facility-administered encounter medications on file as of 01/13/2018.     Surgical History: Past Surgical History:  Procedure Laterality Date  . APPENDECTOMY    . BACK SURGERY    . COLONOSCOPY  August 2015  . elbow surgery    . HEMORRHOID SURGERY    . LAPAROSCOPIC APPENDECTOMY N/A 12/04/2017   Procedure: APPENDECTOMY LAPAROSCOPIC;  Surgeon: Vickie Epley, MD;  Location: [facility] ORS;  Service: General;  Laterality: N/A;  . LIPOMA EXCISION Right    leg  . 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 SPINAL CORD STIMULATOR IMPLANT    . UPPER GI ENDOSCOPY  August 2015    Medical History: Past Medical History:  Diagnosis Date  . Acute appendicitis 12/04/2017  . Allergy    takes allergy shots  . Anemia   . Arthritis   . Asthma   . Barrett esophagus   . Bronchitis   . Chronic pain   . Colon polyp   . Depression   . GERD (gastroesophageal reflux disease)   . Hemorrhoid   . Hyperlipidemia   . Hypertension   . Hypothyroid   . IBS (irritable bowel syndrome)   . Kidney stone   . MI (myocardial infarction) ([place])   . Migraine   . Pneumonia   . Seizures ([place])   . Sinus problem   . Spinal cord stimulator status     Family History: Family History  Problem Relation Age of Onset  . Hypertension Mother   . Lung disease Mother   . Heart attack Father   . Diabetes Father     Social History   Socioeconomic History  . Marital status: Married    Spouse name: Not on file  . Number of children: Not on file  . Years of education: Not on file  . Highest education level: Not on file  Occupational History  . Not on file  Social Needs  . Financial resource strain: Not on file  . Food insecurity:    Worry: Not on file    Inability: Not on file  . Transportation needs:    Medical: Not on file    Non-medical: Not on file  Tobacco Use  . Smoking status: Former Smoker    Packs/day: 1.00    Years: 30.00    Pack years:  30.00    Types: Cigarettes    Last attempt to quit: 02/22/2005    Years since quitting: 12.9  . Smokeless tobacco: Never Used  Substance and Sexual Activity  . Alcohol use: Yes    Alcohol/week: 0.0 standard drinks    Comment: occasionally  . Drug use: No    Comment: past  . Sexual activity: Yes  Lifestyle  . Physical activity:    Days per week: Not on file    Minutes per session: Not on file  . Stress: Not on  file  Relationships  . Social connections:    Talks on phone: Not on file    Gets together: Not on file    Attends religious service: Not on file    Active member of club or organization: Not on file    Attends meetings of clubs or organizations: Not on file    Relationship status: Not on file  . Intimate partner violence:    Fear of current or ex partner: Not on file    Emotionally abused: Not on file    Physically abused: Not on file    Forced sexual activity: Not on file  Other Topics Concern  . Not on file  Social History Narrative  . Not on file      Review of Systems  Vital Signs: BP (!) 156/88 (BP Location: Left Arm, Patient Position: Sitting, Cuff Size: Normal)   Pulse 80   Resp 16   Ht 6' (1.829 m)   Wt 219 lb (99.3 kg)   SpO2 98%   BMI 29.70 kg/m    Physical Exam  Constitutional: He is oriented to person, place, and time. He appears well-developed and well-nourished. No distress.  HENT:  Head: Normocephalic and atraumatic.  Mouth/Throat: Oropharynx is clear and moist. No oropharyngeal exudate.  Eyes: Pupils are equal, round, and reactive to light. EOM are normal.  Neck: Normal range of motion. Neck supple. No JVD present. No tracheal deviation present. No thyromegaly present.  Cardiovascular: Normal rate, regular rhythm and normal heart sounds. Exam reveals no gallop and no friction rub.  No murmur heard. Pulmonary/Chest: Effort normal and breath sounds normal. No respiratory distress. He has no wheezes. He has no rales. He exhibits no  tenderness.  Abdominal: Soft. There is no tenderness. There is no guarding.  Musculoskeletal: Normal range of motion.  Lymphadenopathy:    He has no cervical adenopathy.  Neurological: He is alert and oriented to person, place, and time. No cranial nerve deficit.  Skin: Skin is warm and dry. He is not diaphoretic.  Psychiatric: He has a normal mood and affect. His behavior is normal. Judgment and thought content normal.  Nursing note and vitals reviewed.  Assessment/Plan: 1. Encounter for long-term (current) use of medications Pt drug screen in flowsheet.  Positive for expected substances.  - POCT Urine Drug Screen  2. Generalized anxiety disorder with panic attacks Refilled Xanax as below. - ALPRAZolam (XANAX) 1 MG tablet; Take 1 tablet (1 mg total) by mouth 3 (three) times daily as needed for anxiety.  Dispense: 270 tablet; Refill: 1 Refilled Controlled medications today. Reviewed risks and possible side effects associated with taking Stimulants. Combination of these drugs with other psychotropic medications could cause dizziness and drowsiness. Pt needs to Monitor symptoms and exercise caution in driving and operating heavy machinery to avoid damages to oneself, to others and to the surroundings. Patient verbalized understanding in this matter. Dependence and abuse for these drugs will be monitored closely. A Controlled substance policy and procedure is on file which allows [facility] to order a urine drug screen test at any visit. Patient understands and agrees with the plan.  3. Benign prostatic hyperplasia with lower urinary tract symptoms, symptom details unspecified Refilled Uroxatral.  - alfuzosin (UROXATRAL) 10 MG 24 hr tablet; Take 1 tablet (10 mg total) by mouth daily.  Dispense: 90 tablet; Refill: 3  4. Narcolepsy due to underlying condition without cataplexy Refilled Adderall for 3 months.  - amphetamine-dextroamphetamine (ADDERALL) 30 MG tablet; Take 1 tablet  by  mouth 3 (three) times daily.  Dispense: 90 tablet; Refill: 0 - amphetamine-dextroamphetamine (ADDERALL) 30 MG tablet; Take 1 tablet by mouth 3 (three) times daily.  Dispense: 90 tablet; Refill: 0 - amphetamine-dextroamphetamine (ADDERALL) 30 MG tablet; Take 1 tablet by mouth 3 (three) times daily.  Dispense: 90 tablet; Refill: 0 Refilled Controlled medications today. Reviewed risks and possible side effects associated with taking Stimulants. Combination of these drugs with other psychotropic medications could cause dizziness and drowsiness. Pt needs to Monitor symptoms and exercise caution in driving and operating heavy machinery to avoid damages to oneself, to others and to the surroundings. Patient verbalized understanding in this matter. Dependence and abuse for these drugs will be monitored closely. A Controlled substance policy and procedure is on file which allows [facility] to order a urine drug screen test at any visit. Patient understands and agrees with the plan..  5. Screen for colon cancer PT has significant family history of colon cancer.  - Ambulatory referral to Gastroenterology  General Counseling: kaymon denomme understanding of the findings of todays visit and agrees with plan of treatment. I have discussed any further diagnostic evaluation that may be needed or ordered today. We also reviewed his medications today. he has been encouraged to call the office with any questions or concerns that should arise related to todays visit.    Orders Placed This Encounter  Procedures  . Ambulatory referral to Gastroenterology  . POCT Urine Drug Screen    Meds ordered this encounter  Medications  . alfuzosin (UROXATRAL) 10 MG 24 hr tablet    Sig: Take 1 tablet (10 mg total) by mouth daily.    Dispense:  90 tablet    Refill:  3  . DISCONTD: ALPRAZolam (XANAX) 1 MG tablet    Sig: Take 1 tablet (1 mg total) by mouth 3 (three) times daily as needed for anxiety.     Dispense:  270 tablet    Refill:  1    For mail order  . ALPRAZolam (XANAX) 1 MG tablet    Sig: Take 1 tablet (1 mg total) by mouth 3 (three) times daily as needed for anxiety.    Dispense:  270 tablet    Refill:  1    For mail order  . amphetamine-dextroamphetamine (ADDERALL) 30 MG tablet    Sig: Take 1 tablet by mouth 3 (three) times daily.    Dispense:  90 tablet    Refill:  0  . DISCONTD: amphetamine-dextroamphetamine (ADDERALL XR) 30 MG 24 hr capsule    Sig: Take 1 capsule (30 mg total) by mouth every morning.    Dispense:  90 capsule    Refill:  0    Do not fill before 02/12/2018  . amphetamine-dextroamphetamine (ADDERALL) 30 MG tablet    Sig: Take 1 tablet by mouth 3 (three) times daily.    Dispense:  90 tablet    Refill:  0    Do not fill before 02/12/2018  . amphetamine-dextroamphetamine (ADDERALL) 30 MG tablet    Sig: Take 1 tablet by mouth 3 (three) times daily.    Dispense:  90 tablet    Refill:  0    Do not fill before 03/14/2018    Time spent: 25 Minutes   This patient was seen by Orson Gear AGNP-C in Collaboration with Dr Lavera Guise as a part of collaborative care agreement     Kendell Bane AGNP-C Internal medicine

## 2018-01-17 ENCOUNTER — Telehealth: Payer: Self-pay | Admitting: Adult Health

## 2018-01-17 NOTE — Telephone Encounter (Signed)
Prior authorization for Adderall three times daily has approved been approved to 01/14/2019 Pharmacy notified

## 2018-01-27 ENCOUNTER — Other Ambulatory Visit: Payer: Self-pay

## 2018-01-27 DIAGNOSIS — D509 Iron deficiency anemia, unspecified: Secondary | ICD-10-CM

## 2018-01-29 NOTE — Progress Notes (Signed)
[place]  [phone] [phone]) [phone]  ID: Randy Mclean Younger OB: 04-13-1960  MR#: [id number]  AOZ#:[id number]  Patient Care Team: Lavera Guise, MD as PCP - General (Internal Medicine) Bary Castilla Forest Gleason, MD (General Surgery) Lavera Guise, MD (Internal Medicine)  CHIEF COMPLAINT: Iron deficiency anemia.  INTERVAL HISTORY: Patient returns to clinic today for further evaluation, laboratory work, consideration of IV Feraheme.  He continues to have chronic weakness and fatigue, but otherwise feels well. He has no neurologic complaints.  He denies any recent fevers.  He denies any chest pain or shortness of breath.  He has a good appetite and denies weight loss.  He denies any nausea, vomiting, constipation, or diarrhea.  He has no melena or hematochezia.  He has no urinary complaints.  Patient offers no further specific complaints today.  REVIEW OF SYSTEMS:   Review of Systems  Constitutional: Positive for malaise/fatigue. Negative for fever and weight loss.  Respiratory: Negative.  Negative for cough and shortness of breath.   Cardiovascular: Negative.  Negative for chest pain and leg swelling.  Gastrointestinal: Negative.  Negative for abdominal pain, blood in stool and melena.  Genitourinary: Negative.  Negative for hematuria.  Musculoskeletal: Negative.  Negative for back pain.  Skin: Negative.  Negative for rash.  Neurological: Positive for weakness. Negative for sensory change and focal weakness.  Psychiatric/Behavioral: Negative.  The patient is not nervous/anxious.     As per HPI. Otherwise, a complete review of systems is negative.  PAST MEDICAL HISTORY: Past Medical History:  Diagnosis Date  . Acute appendicitis 12/04/2017  . Allergy    takes allergy shots  . Anemia   . Arthritis   . Asthma   . Barrett esophagus   . Bronchitis   . Chronic pain   . Colon polyp   . Depression   . GERD (gastroesophageal reflux disease)   .  Hemorrhoid   . Hyperlipidemia   . Hypertension   . Hypothyroid   . IBS (irritable bowel syndrome)   . Kidney stone   . MI (myocardial infarction) ([place])   . Migraine   . Pneumonia   . Seizures ([place])   . Sinus problem   . Spinal cord stimulator status     PAST SURGICAL HISTORY: Past Surgical History:  Procedure Laterality Date  . APPENDECTOMY    . BACK SURGERY    . COLONOSCOPY  August 2015  . elbow surgery    . HEMORRHOID SURGERY    . LAPAROSCOPIC APPENDECTOMY N/A 12/04/2017   Procedure: APPENDECTOMY LAPAROSCOPIC;  Surgeon: Vickie Epley, MD;  Location: [facility] ORS;  Service: General;  Laterality: N/A;  . LIPOMA EXCISION Right    leg  . 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 SPINAL CORD STIMULATOR IMPLANT    . UPPER GI ENDOSCOPY  August 2015    FAMILY HISTORY Family History  Problem Relation Age of Onset  . Hypertension Mother   . Lung disease Mother   . Heart attack Father   . Diabetes Father        ADVANCED DIRECTIVES:    HEALTH MAINTENANCE: Social History   Tobacco Use  . Smoking status: Former Smoker    Packs/day: 1.00    Years: 30.00    Pack years: 30.00    Types: Cigarettes    Last attempt to quit: 02/22/2005    Years since quitting: 12.9  . Smokeless tobacco: Never Used  Substance Use Topics  . Alcohol use:  Yes    Alcohol/week: 0.0 standard drinks    Comment: occasionally  . Drug use: No    Comment: past     Colonoscopy:  PAP:  Bone density:  Lipid panel:  Allergies  Allergen Reactions  . Amoxicillin Anaphylaxis    REACTION: unspecified  . Penicillin G Anaphylaxis  . Gabapentin Other (See Comments)    REACTION: anaphylaxis  . Penicillins     REACTION: unspecified  . Adhesive [Tape] Rash    Band-aid    Current Outpatient Medications  Medication Sig Dispense Refill  . albuterol (PROVENTIL HFA;VENTOLIN HFA) 108 (90 Base) MCG/ACT inhaler Frequency:PHARMDIR   Dosage:90   MCG  Instructions:  Note:Dose: 49 MCG    . alfuzosin  (UROXATRAL) 10 MG 24 hr tablet Take 1 tablet (10 mg total) by mouth daily. 90 tablet 3  . ALPRAZolam (XANAX) 1 MG tablet Take 1 tablet (1 mg total) by mouth 3 (three) times daily as needed for anxiety. 270 tablet 1  . [START ON 02/12/2018] amphetamine-dextroamphetamine (ADDERALL) 30 MG tablet Take 1 tablet by mouth 3 (three) times daily. 90 tablet 0  . [START ON 03/14/2018] amphetamine-dextroamphetamine (ADDERALL) 30 MG tablet Take 1 tablet by mouth 3 (three) times daily. 90 tablet 0  . atorvastatin (LIPITOR) 10 MG tablet Take 1 tablet (10 mg total) by mouth daily. 90 tablet 3  . azelastine (ASTELIN) 0.1 % nasal spray Place 1 spray into both nostrils 2 (two) times daily. 30 mL 1  . buPROPion (WELLBUTRIN XL) 300 MG 24 hr tablet Take 1 tablet (300 mg total) by mouth daily. 90 tablet 0  . citalopram (CELEXA) 20 MG tablet Take 1 tablet (20 mg total) by mouth daily. 90 tablet 0  . esomeprazole (NEXIUM) 40 MG capsule TK ONE C PO  BID UTD  0  . l-methylfolate-B6-B12 (METANX) 3-35-2 MG TABS Take 1 tablet by mouth 2 (two) times daily.    Marland Kitchen losartan (COZAAR) 50 MG tablet Take 1 tablet (50 mg total) by mouth daily. 90 tablet 3  . meloxicam (MOBIC) 7.5 MG tablet Take 1 tablet (7.5 mg total) by mouth daily. 180 tablet 3  . metoprolol succinate (TOPROL-XL) 25 MG 24 hr tablet Take 1 tablet (25 mg total) by mouth daily. 90 tablet 3  . mometasone-formoterol (DULERA) 100-5 MCG/ACT AERO Inhale 2 puffs into the lungs every 12 (twelve) hours. 3 Inhaler 3  . Multiple Vitamin (MULTI-VITAMINS) TABS Take by mouth.    Gean Birchwood ER 200 MG TB12 200 mg bid  0  . oxyCODONE-acetaminophen (PERCOCET) 7.5-325 MG per tablet Take 1 tablet by mouth every 4 (four) hours as needed for pain.     . pregabalin (LYRICA) 50 MG capsule Take 100 mg by mouth 3 (three) times daily.     Marland Kitchen senna (SENOKOT) 8.6 MG tablet Take by mouth.    . SYRINGE-NEEDLE, DISP, 3 ML (MONOJECT 3CC SYR 22GX1") 22G X 1" 3 ML MISC Use as directed.    . testosterone  cypionate (DEPOTESTOTERONE CYPIONATE) 100 MG/ML injection Inject 0.75 mLs (75 mg total) into the muscle every 7 (seven) days. For IM use only 10 mL 3  . TIROSINT 150 MCG CAPS TAKE ONE CAPSULE BY MOUTH EVERY MORNING FOR HYPOTHYROIDISM ONE-HALF HOUR BEFORE A MEAL 90 capsule 3   No current facility-administered medications for this visit.     OBJECTIVE: Vitals:   01/31/18 1327  BP: 137/74  Pulse: 73  Temp: (!) 97 F (36.1 C)     Body mass index is  29.59 kg/m.    ECOG FS:0 - Asymptomatic  General: Well-developed, well-nourished, no acute distress. Eyes: Pink conjunctiva, anicteric sclera. HEENT: Normocephalic, moist mucous membranes. Lungs: Clear to auscultation bilaterally. Heart: Regular rate and rhythm. No rubs, murmurs, or gallops. Abdomen: Soft, nontender, nondistended. No organomegaly noted, normoactive bowel sounds. Musculoskeletal: No edema, cyanosis, or clubbing. Neuro: Alert, answering all questions appropriately. Cranial nerves grossly intact. Skin: No rashes or petechiae noted. Psych: Normal affect.  LAB RESULTS:  Lab Results  Component Value Date   NA 138 12/04/2017   K 3.8 12/04/2017   CL 101 12/04/2017   CO2 27 12/04/2017   GLUCOSE 86 12/04/2017   BUN 20 12/04/2017   CREATININE 1.16 12/04/2017   CALCIUM 8.2 (L) 12/04/2017   PROT 6.7 12/04/2017   ALBUMIN 3.9 12/04/2017   AST 20 12/04/2017   ALT 20 12/04/2017   ALKPHOS 64 12/04/2017   BILITOT 0.8 12/04/2017   GFRNONAA >60 12/04/2017   GFRAA >60 12/04/2017    Lab Results  Component Value Date   WBC 6.1 01/31/2018   NEUTROABS 3.1 01/31/2018   HGB 12.6 (L) 01/31/2018   HCT 40.3 01/31/2018   MCV 80.1 01/31/2018   PLT 215 01/31/2018   Lab Results  Component Value Date   IRON 31 (L) 01/31/2018   TIBC 402 01/31/2018   IRONPCTSAT 8 (L) 01/31/2018   Lab Results  Component Value Date   FERRITIN 6 (L) 01/31/2018     STUDIES: No results found.  ASSESSMENT: Iron deficiency anemia.  PLAN:    1.   Iron deficiency anemia: Patient's hemoglobin is mildly decreased at 12.6, but essentially stable.  His iron stores are also decreased.  He does not require IV Feraheme today, although he will likely require treatment at his next clinic visit.  No further interventions are needed.  Return to clinic in 3 months with repeat laboratory work and further evaluation.   2. Family history of colon cancer:  Patient gets routine colonoscopies given his family history of colon cancer, all of which been unrevealing.  His next colonoscopy is due within the next year.  His genetic testing is negative.  I spent a total of 20 minutes face-to-face with the patient of which greater than 50% of the visit was spent in counseling and coordination of care as detailed above.    Patient expressed understanding and was in agreement with this plan. He also understands that He can call clinic at any time with any questions, concerns, or complaints.    Lloyd Huger, MD   02/02/2018 9:47 PM

## 2018-01-31 ENCOUNTER — Inpatient Hospital Stay (HOSPITAL_BASED_OUTPATIENT_CLINIC_OR_DEPARTMENT_OTHER): Payer: BLUE CROSS/BLUE SHIELD | Admitting: Oncology

## 2018-01-31 ENCOUNTER — Other Ambulatory Visit: Payer: Self-pay

## 2018-01-31 ENCOUNTER — Inpatient Hospital Stay: Payer: BLUE CROSS/BLUE SHIELD

## 2018-01-31 ENCOUNTER — Inpatient Hospital Stay: Payer: BLUE CROSS/BLUE SHIELD | Attending: Oncology

## 2018-01-31 VITALS — BP 137/74 | HR 73 | Temp 97.0°F | Ht 72.0 in | Wt 218.2 lb

## 2018-01-31 DIAGNOSIS — Z800 Family history of malignant neoplasm of digestive organs: Secondary | ICD-10-CM

## 2018-01-31 DIAGNOSIS — Z79899 Other long term (current) drug therapy: Secondary | ICD-10-CM | POA: Insufficient documentation

## 2018-01-31 DIAGNOSIS — I10 Essential (primary) hypertension: Secondary | ICD-10-CM

## 2018-01-31 DIAGNOSIS — Z87891 Personal history of nicotine dependence: Secondary | ICD-10-CM | POA: Diagnosis not present

## 2018-01-31 DIAGNOSIS — D509 Iron deficiency anemia, unspecified: Secondary | ICD-10-CM

## 2018-01-31 LAB — CBC WITH DIFFERENTIAL/PLATELET
Abs Immature Granulocytes: 0.01 10*3/uL (ref 0.00–0.07)
Basophils Absolute: 0.1 10*3/uL (ref 0.0–0.1)
Basophils Relative: 1 %
Eosinophils Absolute: 0.2 10*3/uL (ref 0.0–0.5)
Eosinophils Relative: 3 %
HCT: 40.3 % (ref 39.0–52.0)
Hemoglobin: 12.6 g/dL — ABNORMAL LOW (ref 13.0–17.0)
Immature Granulocytes: 0 %
Lymphocytes Relative: 36 %
Lymphs Abs: 2.2 10*3/uL (ref 0.7–4.0)
MCH: 25 pg — ABNORMAL LOW (ref 26.0–34.0)
MCHC: 31.3 g/dL (ref 30.0–36.0)
MCV: 80.1 fL (ref 80.0–100.0)
Monocytes Absolute: 0.5 10*3/uL (ref 0.1–1.0)
Monocytes Relative: 9 %
Neutro Abs: 3.1 10*3/uL (ref 1.7–7.7)
Neutrophils Relative %: 51 %
Platelets: 215 10*3/uL (ref 150–400)
RBC: 5.03 MIL/uL (ref 4.22–5.81)
RDW: 16.4 % — ABNORMAL HIGH (ref 11.5–15.5)
WBC: 6.1 10*3/uL (ref 4.0–10.5)
nRBC: 0 % (ref 0.0–0.2)

## 2018-01-31 LAB — IRON AND TIBC
Iron: 31 ug/dL — ABNORMAL LOW (ref 45–182)
Saturation Ratios: 8 % — ABNORMAL LOW (ref 17.9–39.5)
TIBC: 402 ug/dL (ref 250–450)
UIBC: 371 ug/dL

## 2018-01-31 LAB — FERRITIN: Ferritin: 6 ng/mL — ABNORMAL LOW (ref 24–336)

## 2018-01-31 MED ORDER — MOMETASONE FURO-FORMOTEROL FUM 100-5 MCG/ACT IN AERO: 2.0000 | INHALATION_SPRAY | Freq: Two times a day (BID) | RESPIRATORY_TRACT | 3 refills | Status: DC

## 2018-01-31 NOTE — Progress Notes (Signed)
Patient is here today to follow up on his anemia. Patient stated that he had been feeling good with his anemia but he suffers from chronic back pain.

## 2018-02-01 ENCOUNTER — Ambulatory Visit: Payer: Self-pay | Admitting: Adult Health

## 2018-02-01 DIAGNOSIS — Z411 Encounter for cosmetic surgery: Secondary | ICD-10-CM

## 2018-02-01 NOTE — Progress Notes (Signed)
Patient treated with Botox Cosmetic/ OnabotulinumtoxinA  Risks and Benefits explained to patient, Written consent obtained and signed.   Lot#: [id number] Exp: 06/2020  Glabellar: 12 Units Frontalis: 15 units Crows Feet: 12 Units Other: 0  Total: 39 Units  Aftercare instructions given to patient.  She denied any questions.  Will follow up in 14 days for results review.

## 2018-03-01 ENCOUNTER — Other Ambulatory Visit: Payer: Self-pay | Admitting: Adult Health

## 2018-03-14 ENCOUNTER — Other Ambulatory Visit: Payer: Self-pay

## 2018-03-14 MED ORDER — MELOXICAM 7.5 MG PO TABS: 7.5000 mg | ORAL_TABLET | Freq: Every day | ORAL | 1 refills | Status: DC

## 2018-03-14 MED ORDER — BUPROPION HCL ER (XL) 300 MG PO TB24: 300.0000 mg | ORAL_TABLET | Freq: Every day | ORAL | 1 refills | Status: DC

## 2018-03-15 ENCOUNTER — Other Ambulatory Visit: Payer: Self-pay

## 2018-03-15 MED ORDER — MELOXICAM 7.5 MG PO TABS: 7.5000 mg | ORAL_TABLET | Freq: Two times a day (BID) | ORAL | 1 refills | Status: DC

## 2018-03-20 DIAGNOSIS — M961 Postlaminectomy syndrome, not elsewhere classified: Secondary | ICD-10-CM | POA: Diagnosis not present

## 2018-03-20 DIAGNOSIS — G894 Chronic pain syndrome: Secondary | ICD-10-CM | POA: Diagnosis not present

## 2018-03-20 DIAGNOSIS — Z79891 Long term (current) use of opiate analgesic: Secondary | ICD-10-CM | POA: Diagnosis not present

## 2018-03-20 DIAGNOSIS — M5417 Radiculopathy, lumbosacral region: Secondary | ICD-10-CM | POA: Diagnosis not present

## 2018-03-28 ENCOUNTER — Ambulatory Visit: Payer: BLUE CROSS/BLUE SHIELD | Admitting: Nurse Practitioner

## 2018-03-28 ENCOUNTER — Encounter: Payer: Self-pay | Admitting: Nurse Practitioner

## 2018-03-28 VITALS — BP 138/76 | HR 78 | Temp 98.8°F | Resp 16 | Ht 72.0 in | Wt 224.4 lb

## 2018-03-28 DIAGNOSIS — J209 Acute bronchitis, unspecified: Secondary | ICD-10-CM | POA: Diagnosis not present

## 2018-03-28 DIAGNOSIS — J4521 Mild intermittent asthma with (acute) exacerbation: Secondary | ICD-10-CM | POA: Diagnosis not present

## 2018-03-28 DIAGNOSIS — R059 Cough, unspecified: Secondary | ICD-10-CM

## 2018-03-28 DIAGNOSIS — J45909 Unspecified asthma, uncomplicated: Secondary | ICD-10-CM

## 2018-03-28 DIAGNOSIS — B372 Candidiasis of skin and nail: Secondary | ICD-10-CM

## 2018-03-28 DIAGNOSIS — R509 Fever, unspecified: Secondary | ICD-10-CM | POA: Diagnosis not present

## 2018-03-28 DIAGNOSIS — R05 Cough: Secondary | ICD-10-CM | POA: Diagnosis not present

## 2018-03-28 DIAGNOSIS — IMO0001 Reserved for inherently not codable concepts without codable children: Secondary | ICD-10-CM

## 2018-03-28 DIAGNOSIS — R51 Headache: Secondary | ICD-10-CM | POA: Diagnosis not present

## 2018-03-28 DIAGNOSIS — R519 Headache, unspecified: Secondary | ICD-10-CM

## 2018-03-28 LAB — POC INFLUENZA TEST: Negative: NEGATIVE

## 2018-03-28 MED ORDER — HYDROCOD POLST-CPM POLST ER 10-8 MG/5ML PO SUER: 5.0000 mL | Freq: Two times a day (BID) | ORAL | 0 refills | Status: DC | PRN

## 2018-03-28 MED ORDER — LEVOFLOXACIN 500 MG PO TABS: 500.0000 mg | ORAL_TABLET | Freq: Every day | ORAL | 0 refills | Status: DC

## 2018-03-28 MED ORDER — PREDNISONE 10 MG (21) PO TBPK: ORAL_TABLET | ORAL | 0 refills | Status: DC

## 2018-03-28 MED ORDER — ALBUTEROL SULFATE HFA 108 (90 BASE) MCG/ACT IN AERS: 2.0000 | INHALATION_SPRAY | RESPIRATORY_TRACT | 5 refills | Status: DC | PRN

## 2018-03-28 MED ORDER — CLOTRIMAZOLE-BETAMETHASONE 1-0.05 % EX CREA: 1.0000 "application " | TOPICAL_CREAM | Freq: Two times a day (BID) | CUTANEOUS | 1 refills | Status: DC

## 2018-03-28 NOTE — Progress Notes (Signed)
[facility] [place], [place] [address]  Internal MEDICINE  Office Visit Note  Patient Name: Randy Mclean Younger  [id number]  [id number]  Date of Service: 04/05/2018   Pt is here for a sick visit.   Chief Complaint  Patient presents with  . Cough    dark brown mucous   . Headache  . Pain    joint pain  . Fever    on and off, not consistent low grade highest 100.3  . Rash    pt has rash on upper extremities, itching, noticed it after getting tattoo completed and used two different ointments     Cough  This is a new problem. The current episode started yesterday. The problem has been gradually worsening. The problem occurs every few minutes. The cough is productive of sputum. Associated symptoms include chills, a fever, headaches, myalgias, nasal congestion, postnasal drip, a rash, rhinorrhea, a sore throat and wheezing. Pertinent negatives include no chest pain. He has tried cool air, OTC cough suppressant and rest for the symptoms. The treatment provided mild relief. His past medical history is significant for asthma and environmental allergies.        Current Medication:  Outpatient Encounter Medications as of 03/28/2018  Medication Sig  . albuterol (PROVENTIL HFA;VENTOLIN HFA) 108 (90 Base) MCG/ACT inhaler Inhale 2 puffs into the lungs every 4 (four) hours as needed for wheezing or shortness of breath.  . alfuzosin (UROXATRAL) 10 MG 24 hr tablet Take 1 tablet (10 mg total) by mouth daily.  Marland Kitchen ALPRAZolam (XANAX) 1 MG tablet Take 1 tablet (1 mg total) by mouth 3 (three) times daily as needed for anxiety.  Marland Kitchen amphetamine-dextroamphetamine (ADDERALL) 30 MG tablet Take 1 tablet by mouth 3 (three) times daily.  Marland Kitchen amphetamine-dextroamphetamine (ADDERALL) 30 MG tablet Take 1 tablet by mouth 3 (three) times daily.  Marland Kitchen atorvastatin (LIPITOR) 10 MG tablet Take 1 tablet (10 mg total) by mouth daily.  Marland Kitchen azelastine (ASTELIN) 0.1 % nasal spray Place 1 spray into  both nostrils 2 (two) times daily.  Marland Kitchen buPROPion (WELLBUTRIN XL) 300 MG 24 hr tablet Take 1 tablet (300 mg total) by mouth daily.  . citalopram (CELEXA) 20 MG tablet TAKE 1 TABLET BY MOUTH DAILY  . esomeprazole (NEXIUM) 40 MG capsule TK ONE C PO  BID UTD  . l-methylfolate-B6-B12 (METANX) 3-35-2 MG TABS Take 1 tablet by mouth 2 (two) times daily.  Marland Kitchen losartan (COZAAR) 50 MG tablet Take 1 tablet (50 mg total) by mouth daily.  . meloxicam (MOBIC) 7.5 MG tablet Take 1 tablet (7.5 mg total) by mouth 2 (two) times daily.  . metoprolol succinate (TOPROL-XL) 25 MG 24 hr tablet Take 1 tablet (25 mg total) by mouth daily.  . mometasone-formoterol (DULERA) 100-5 MCG/ACT AERO Inhale 2 puffs into the lungs every 12 (twelve) hours.  . Multiple Vitamin (MULTI-VITAMINS) TABS Take by mouth.  Gean Birchwood ER 200 MG TB12 200 mg bid  . oxyCODONE-acetaminophen (PERCOCET) 7.5-325 MG per tablet Take 1 tablet by mouth every 4 (four) hours as needed for pain.   . pregabalin (LYRICA) 50 MG capsule Take 100 mg by mouth 3 (three) times daily.   Marland Kitchen senna (SENOKOT) 8.6 MG tablet Take by mouth.  . SYRINGE-NEEDLE, DISP, 3 ML (MONOJECT 3CC SYR 22GX1") 22G X 1" 3 ML MISC Use as directed.  . testosterone cypionate (DEPOTESTOTERONE CYPIONATE) 100 MG/ML injection Inject 0.75 mLs (75 mg total) into the muscle every 7 (seven) days. For IM use  only  . TIROSINT 150 MCG CAPS TAKE ONE CAPSULE BY MOUTH EVERY MORNING FOR HYPOTHYROIDISM ONE-HALF HOUR BEFORE A MEAL  . [DISCONTINUED] albuterol (PROVENTIL HFA;VENTOLIN HFA) 108 (90 Base) MCG/ACT inhaler Frequency:PHARMDIR   Dosage:90   MCG  Instructions:  Note:Dose: 32 MCG  . chlorpheniramine-HYDROcodone (TUSSIONEX PENNKINETIC ER) 10-8 MG/5ML SUER Take 5 mLs by mouth every 12 (twelve) hours as needed for cough.  . clotrimazole-betamethasone (LOTRISONE) cream Apply 1 application topically 2 (two) times daily.  Marland Kitchen levofloxacin (LEVAQUIN) 500 MG tablet Take 1 tablet (500 mg total) by mouth daily.  .  predniSONE (STERAPRED UNI-PAK 21 TAB) 10 MG (21) TBPK tablet 6 day taper - take by mouth as directed for 6 days   No facility-administered encounter medications on file as of 03/28/2018.       Medical History: Past Medical History:  Diagnosis Date  . Acute appendicitis 12/04/2017  . Allergy    takes allergy shots  . Anemia   . Arthritis   . Asthma   . Barrett esophagus   . Bronchitis   . Chronic pain   . Colon polyp   . Depression   . GERD (gastroesophageal reflux disease)   . Hemorrhoid   . Hyperlipidemia   . Hypertension   . Hypothyroid   . IBS (irritable bowel syndrome)   . Kidney stone   . MI (myocardial infarction) ([place])   . Migraine   . Pneumonia   . Seizures ([place])   . Sinus problem   . Spinal cord stimulator status      Today's Vitals   03/28/18 1612  BP: 138/76  Pulse: 78  Resp: 16  Temp: 98.8 F (37.1 C)  SpO2: 96%  Weight: 224 lb 6.4 oz (101.8 kg)  Height: 6' (1.829 m)   Body mass index is 30.43 kg/m.  Review of Systems  Constitutional: Positive for chills, fatigue and fever.  HENT: Positive for congestion, postnasal drip, rhinorrhea, sinus pressure, sinus pain, sore throat and voice change.   Respiratory: Positive for cough and wheezing.   Cardiovascular: Negative for chest pain and palpitations.  Musculoskeletal: Positive for arthralgias and myalgias.  Skin: Positive for rash.       Left arm, covering surface of skin around perimeter of new tattoo and stretching into the underarm.   Allergic/Immunologic: Positive for environmental allergies.  Neurological: Positive for headaches.  Hematological: Positive for adenopathy.    Physical Exam Vitals signs and nursing note reviewed.  Constitutional:      Appearance: He is ill-appearing.  HENT:     Right Ear: Tympanic membrane is bulging.     Left Ear: Tympanic membrane is bulging.     Nose: Congestion and rhinorrhea present. Rhinorrhea is purulent.     Right Turbinates: Swollen.     Left  Turbinates: Swollen.     Right Sinus: Frontal sinus tenderness present.     Left Sinus: Frontal sinus tenderness present.     Mouth/Throat:     Pharynx: Posterior oropharyngeal erythema present.  Cardiovascular:     Rate and Rhythm: Normal rate and regular rhythm.     Heart sounds: Normal heart sounds.  Pulmonary:     Breath sounds: Wheezing present.     Comments: Congested, non-productive cough present.  Musculoskeletal: Normal range of motion.  Lymphadenopathy:     Cervical: Cervical adenopathy present.  Skin:    Findings: Rash present.     Comments: Red, raised, erythematous rash left inner arm along perimeter of new tattoo. Stretches into left  axillary region. Very itchy. Skin intact with no drainage noted.   Neurological:     General: No focal deficit present.     Mental Status: He is alert.  Psychiatric:        Mood and Affect: Mood normal.    Assessment/Plan: 1. Fever, unspecified fever cause Flu test negative. Will treat as acute bronchitis and flare of chronic asthma.  - POC INFLUENZA TEST  2. Acute bronchitis with asthma Levofloxacin 500mg  daily for 10 days. Add prednisone taper. Take as directed for 6 days. Rest and increase fluids. Recommend use of OTC medication as needed and as indicated to reduce symptoms.  - predniSONE (STERAPRED UNI-PAK 21 TAB) 10 MG (21) TBPK tablet; 6 day taper - take by mouth as directed for 6 days  Dispense: 21 tablet; Refill: 0 - levofloxacin (LEVAQUIN) 500 MG tablet; Take 1 tablet (500 mg total) by mouth daily.  Dispense: 10 tablet; Refill: 0  3. Cough May take tussionex twice daily as needed for cough. Advised patient not to overuse this medicine and not to mix with other medications or alcohol as it can cause respiratory distress, sleepiness or dizziness. Should also avoid driving. Patient voiced understanding and agreement.  - chlorpheniramine-HYDROcodone (TUSSIONEX PENNKINETIC ER) 10-8 MG/5ML SUER; Take 5 mLs by mouth every 12 (twelve)  hours as needed for cough.  Dispense: 115 mL; Refill: 0  4. Moderate intermittent asthma with acute exacerbation Use rescue inhaler and neb treatments as needed and as prescribed  - albuterol (PROVENTIL HFA;VENTOLIN HFA) 108 (90 Base) MCG/ACT inhaler; Inhale 2 puffs into the lungs every 4 (four) hours as needed for wheezing or shortness of breath.  Dispense: 1 Inhaler; Refill: 5  5. Cutaneous candidiasis lotrisone cream may be used twice daily for 7 to 10 days then as needed  - clotrimazole-betamethasone (LOTRISONE) cream; Apply 1 application topically 2 (two) times daily.  Dispense: 45 g; Refill: 1  6. Nonintractable headache, unspecified chronicity pattern, unspecified headache type Negative flu test today.  - POC INFLUENZA TEST  General Counseling: rami budhu understanding of the findings of todays visit and agrees with plan of treatment. I have discussed any further diagnostic evaluation that may be needed or ordered today. We also reviewed his medications today. he has been encouraged to call the office with any questions or concerns that should arise related to todays visit.    Counseling:  Rest and increase fluids. Continue using OTC medication to control symptoms.   This patient was seen by Leretha Pol FNP Collaboration with Dr Lavera Guise as a part of collaborative care agreement  Orders Placed This Encounter  Procedures  . POC INFLUENZA TEST    Meds ordered this encounter  Medications  . albuterol (PROVENTIL HFA;VENTOLIN HFA) 108 (90 Base) MCG/ACT inhaler    Sig: Inhale 2 puffs into the lungs every 4 (four) hours as needed for wheezing or shortness of breath.    Dispense:  1 Inhaler    Refill:  5    Order Specific Question:   Supervising Provider    Answer:   Lavera Guise [[id number]]  . predniSONE (STERAPRED UNI-PAK 21 TAB) 10 MG (21) TBPK tablet    Sig: 6 day taper - take by mouth as directed for 6 days    Dispense:  21 tablet    Refill:  0    Order Specific  Question:   Supervising Provider    Answer:   Lavera Guise [place]  . chlorpheniramine-HYDROcodone (TUSSIONEX PENNKINETIC ER) 10-8  MG/5ML SUER    Sig: Take 5 mLs by mouth every 12 (twelve) hours as needed for cough.    Dispense:  115 mL    Refill:  0    Order Specific Question:   Supervising Provider    Answer:   Lavera Guise [[id number]]  . levofloxacin (LEVAQUIN) 500 MG tablet    Sig: Take 1 tablet (500 mg total) by mouth daily.    Dispense:  10 tablet    Refill:  0    Order Specific Question:   Supervising Provider    Answer:   Lavera Guise [[id number]]  . clotrimazole-betamethasone (LOTRISONE) cream    Sig: Apply 1 application topically 2 (two) times daily.    Dispense:  45 g    Refill:  1    Order Specific Question:   Supervising Provider    Answer:   Lavera Guise [[id number]]    Time spent: 25 Minutes

## 2018-04-05 ENCOUNTER — Other Ambulatory Visit: Payer: Self-pay | Admitting: Adult Health

## 2018-04-05 DIAGNOSIS — R519 Headache, unspecified: Secondary | ICD-10-CM | POA: Insufficient documentation

## 2018-04-05 DIAGNOSIS — J209 Acute bronchitis, unspecified: Secondary | ICD-10-CM | POA: Insufficient documentation

## 2018-04-05 DIAGNOSIS — J4521 Mild intermittent asthma with (acute) exacerbation: Secondary | ICD-10-CM | POA: Insufficient documentation

## 2018-04-05 DIAGNOSIS — R51 Headache: Secondary | ICD-10-CM

## 2018-04-05 DIAGNOSIS — R059 Cough, unspecified: Secondary | ICD-10-CM | POA: Insufficient documentation

## 2018-04-05 DIAGNOSIS — R509 Fever, unspecified: Secondary | ICD-10-CM | POA: Insufficient documentation

## 2018-04-05 DIAGNOSIS — R05 Cough: Secondary | ICD-10-CM | POA: Insufficient documentation

## 2018-04-05 DIAGNOSIS — J45909 Unspecified asthma, uncomplicated: Secondary | ICD-10-CM | POA: Insufficient documentation

## 2018-04-05 DIAGNOSIS — B372 Candidiasis of skin and nail: Secondary | ICD-10-CM | POA: Insufficient documentation

## 2018-04-05 DIAGNOSIS — IMO0001 Reserved for inherently not codable concepts without codable children: Secondary | ICD-10-CM | POA: Insufficient documentation

## 2018-04-05 MED ORDER — LEVOTHYROXINE SODIUM 200 MCG PO CAPS: 200.0000 ug | ORAL_CAPSULE | Freq: Every day | ORAL | 2 refills | Status: DC

## 2018-04-05 NOTE — Progress Notes (Signed)
New RX for synthroid sent, due to patients copay.  Sending preferred dose.

## 2018-04-09 ENCOUNTER — Other Ambulatory Visit: Payer: Self-pay | Admitting: Adult Health

## 2018-04-14 ENCOUNTER — Encounter: Payer: Self-pay | Admitting: Adult Health

## 2018-04-14 ENCOUNTER — Ambulatory Visit
Admission: RE | Admit: 2018-04-14 | Discharge: 2018-04-14 | Disposition: A | Discharge status: Home / Self Care | Payer: BLUE CROSS/BLUE SHIELD | Attending: Adult Health | Admitting: Adult Health

## 2018-04-14 ENCOUNTER — Ambulatory Visit
Admission: RE | Admit: 2018-04-14 | Discharge: 2018-04-14 | Disposition: A | Discharge status: Home / Self Care | Payer: BLUE CROSS/BLUE SHIELD | Source: Ambulatory Visit | Attending: Adult Health | Admitting: Adult Health

## 2018-04-14 ENCOUNTER — Ambulatory Visit: Payer: BLUE CROSS/BLUE SHIELD | Admitting: Adult Health

## 2018-04-14 VITALS — BP 140/78 | HR 87 | Resp 16 | Ht 72.0 in | Wt 227.0 lb

## 2018-04-14 DIAGNOSIS — G8929 Other chronic pain: Secondary | ICD-10-CM | POA: Diagnosis not present

## 2018-04-14 DIAGNOSIS — I10 Essential (primary) hypertension: Secondary | ICD-10-CM | POA: Diagnosis not present

## 2018-04-14 DIAGNOSIS — R0683 Snoring: Secondary | ICD-10-CM

## 2018-04-14 DIAGNOSIS — M25512 Pain in left shoulder: Secondary | ICD-10-CM | POA: Insufficient documentation

## 2018-04-14 DIAGNOSIS — R41840 Attention and concentration deficit: Secondary | ICD-10-CM

## 2018-04-14 DIAGNOSIS — F339 Major depressive disorder, recurrent, unspecified: Secondary | ICD-10-CM

## 2018-04-14 DIAGNOSIS — G4710 Hypersomnia, unspecified: Secondary | ICD-10-CM

## 2018-04-14 DIAGNOSIS — E785 Hyperlipidemia, unspecified: Secondary | ICD-10-CM | POA: Diagnosis not present

## 2018-04-14 DIAGNOSIS — E291 Testicular hypofunction: Secondary | ICD-10-CM

## 2018-04-14 MED ORDER — AMPHETAMINE-DEXTROAMPHETAMINE 30 MG PO TABS: 30.0000 mg | ORAL_TABLET | Freq: Three times a day (TID) | ORAL | 0 refills | Status: DC

## 2018-04-14 MED ORDER — SEMAGLUTIDE 3 MG PO TABS: 3.0000 mg | ORAL_TABLET | Freq: Every day | ORAL | 0 refills | Status: DC

## 2018-04-14 MED ORDER — AMPHETAMINE-DEXTROAMPHETAMINE 30 MG PO TABS: 30.0000 mg | ORAL_TABLET | Freq: Every day | ORAL | 0 refills | Status: DC

## 2018-04-14 NOTE — Progress Notes (Signed)
[facility] [place], [place] [address]  Internal MEDICINE  Office Visit Note  Patient Name: Randy Mclean  [id number]  [id number]  Date of Service: 04/14/2018  Chief Complaint  Patient presents with  . Hyperlipidemia  . Hypertension  . Depression  . Cough    lingering symptoms   . Shoulder Pain    left     HPI  Pt is here for follow up on HLD, HTN, and depression. His Blood pressure is well controlled at this time.  His hyperlipidemia as well as his depression are also well controlled.  Patient will continue current medications as prescribed.  Patient is also reporting increased snoring and hypersomnia and is requesting a home sleep study.  Patient also reports a year of left shoulder pain that he would like investigated.   Current Medication: Outpatient Encounter Medications as of 04/14/2018  Medication Sig  . albuterol (PROVENTIL HFA;VENTOLIN HFA) 108 (90 Base) MCG/ACT inhaler Inhale 2 puffs into the lungs every 4 (four) hours as needed for wheezing or shortness of breath.  . alfuzosin (UROXATRAL) 10 MG 24 hr tablet Take 1 tablet (10 mg total) by mouth daily.  Marland Kitchen ALPRAZolam (XANAX) 1 MG tablet Take 1 tablet (1 mg total) by mouth 3 (three) times daily as needed for anxiety.  Marland Kitchen amphetamine-dextroamphetamine (ADDERALL) 30 MG tablet Take 1 tablet by mouth 3 (three) times daily.  Marland Kitchen atorvastatin (LIPITOR) 10 MG tablet Take 1 tablet (10 mg total) by mouth daily.  Marland Kitchen azelastine (ASTELIN) 0.1 % nasal spray Place 1 spray into both nostrils 2 (two) times daily.  Marland Kitchen buPROPion (WELLBUTRIN XL) 300 MG 24 hr tablet Take 1 tablet (300 mg total) by mouth daily.  . chlorpheniramine-HYDROcodone (TUSSIONEX PENNKINETIC ER) 10-8 MG/5ML SUER Take 5 mLs by mouth every 12 (twelve) hours as needed for cough.  . citalopram (CELEXA) 20 MG tablet TAKE 1 TABLET BY MOUTH DAILY  . clotrimazole-betamethasone (LOTRISONE) cream Apply 1 application topically 2 (two) times daily.  Marland Kitchen  esomeprazole (NEXIUM) 40 MG capsule TK ONE C PO  BID UTD  . l-methylfolate-B6-B12 (METANX) 3-35-2 MG TABS Take 1 tablet by mouth 2 (two) times daily.  . Levothyroxine Sodium (TIROSINT) 200 MCG CAPS Take 200 mcg by mouth daily.  Marland Kitchen losartan (COZAAR) 50 MG tablet Take 1 tablet (50 mg total) by mouth daily.  . meloxicam (MOBIC) 7.5 MG tablet Take 1 tablet (7.5 mg total) by mouth 2 (two) times daily.  . metoprolol succinate (TOPROL-XL) 25 MG 24 hr tablet Take 1 tablet (25 mg total) by mouth daily.  . mometasone-formoterol (DULERA) 100-5 MCG/ACT AERO Inhale 2 puffs into the lungs every 12 (twelve) hours.  . Multiple Vitamin (MULTI-VITAMINS) TABS Take by mouth.  Gean Birchwood ER 200 MG TB12 200 mg bid  . oxyCODONE-acetaminophen (PERCOCET) 7.5-325 MG per tablet Take 1 tablet by mouth every 4 (four) hours as needed for pain.   . pregabalin (LYRICA) 50 MG capsule Take 100 mg by mouth 3 (three) times daily.   Marland Kitchen senna (SENOKOT) 8.6 MG tablet Take by mouth.  . SYRINGE-NEEDLE, DISP, 3 ML (MONOJECT 3CC SYR 22GX1") 22G X 1" 3 ML MISC Use as directed.  . testosterone cypionate (DEPOTESTOTERONE CYPIONATE) 100 MG/ML injection Inject 0.75 mLs (75 mg total) into the muscle every 7 (seven) days. For IM use only  . TIROSINT 150 MCG CAPS TAKE ONE CAPSULE BY MOUTH EVERY MORNING FOR HYPOTHYROIDISM ONE-HALF HOUR BEFORE A MEAL  . amphetamine-dextroamphetamine (ADDERALL) 30 MG tablet Take  1 tablet by mouth 3 (three) times daily.  Derrill Memo ON 06/13/2018] amphetamine-dextroamphetamine (ADDERALL) 30 MG tablet Take 1 tablet by mouth 3 (three) times daily.  Derrill Memo ON 05/14/2018] amphetamine-dextroamphetamine (ADDERALL) 30 MG tablet Take 1 tablet by mouth 3 (three) times daily.  . Semaglutide (RYBELSUS) 3 MG TABS Take 3 mg by mouth daily.  . [DISCONTINUED] amphetamine-dextroamphetamine (ADDERALL) 30 MG tablet Take 1 tablet by mouth 3 (three) times daily.  . [DISCONTINUED] amphetamine-dextroamphetamine (ADDERALL) 30 MG tablet Take 1  tablet by mouth daily.  . [DISCONTINUED] levofloxacin (LEVAQUIN) 500 MG tablet Take 1 tablet (500 mg total) by mouth daily. (Patient not taking: Reported on 04/14/2018)  . [DISCONTINUED] predniSONE (STERAPRED UNI-PAK 21 TAB) 10 MG (21) TBPK tablet 6 day taper - take by mouth as directed for 6 days (Patient not taking: Reported on 04/14/2018)   No facility-administered encounter medications on file as of 04/14/2018.     Surgical History: Past Surgical History:  Procedure Laterality Date  . APPENDECTOMY    . BACK SURGERY    . COLONOSCOPY  August 2015  . elbow surgery    . HEMORRHOID SURGERY    . LAPAROSCOPIC APPENDECTOMY N/A 12/04/2017   Procedure: APPENDECTOMY LAPAROSCOPIC;  Surgeon: Vickie Epley, MD;  Location: [facility] ORS;  Service: General;  Laterality: N/A;  . LIPOMA EXCISION Right    leg  . 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 SPINAL CORD STIMULATOR IMPLANT    . UPPER GI ENDOSCOPY  August 2015    Medical History: Past Medical History:  Diagnosis Date  . Acute appendicitis 12/04/2017  . Allergy    takes allergy shots  . Anemia   . Arthritis   . Asthma   . Barrett esophagus   . Bronchitis   . Chronic pain   . Colon polyp   . Depression   . GERD (gastroesophageal reflux disease)   . Hemorrhoid   . Hyperlipidemia   . Hypertension   . Hypothyroid   . IBS (irritable bowel syndrome)   . Kidney stone   . MI (myocardial infarction) ([place])   . Migraine   . Pneumonia   . Seizures ([place])   . Sinus problem   . Spinal cord stimulator status     Family History: Family History  Problem Relation Age of Onset  . Hypertension Mother   . Lung disease Mother   . Heart attack Father   . Diabetes Father     Social History   Socioeconomic History  . Marital status: Married    Spouse name: Not on file  . Number of children: Not on file  . Years of education: Not on file  . Highest education level: Not on file  Occupational History  . Not on file  Social  Needs  . Financial resource strain: Not on file  . Food insecurity:    Worry: Not on file    Inability: Not on file  . Transportation needs:    Medical: Not on file    Non-medical: Not on file  Tobacco Use  . Smoking status: Former Smoker    Packs/day: 1.00    Years: 30.00    Pack years: 30.00    Types: Cigarettes    Last attempt to quit: 02/22/2005    Years since quitting: 13.1  . Smokeless tobacco: Never Used  Substance and Sexual Activity  . Alcohol use: Yes    Alcohol/week: 0.0 standard drinks    Comment: occasionally  . Drug  use: No    Comment: past  . Sexual activity: Yes  Lifestyle  . Physical activity:    Days per week: Not on file    Minutes per session: Not on file  . Stress: Not on file  Relationships  . Social connections:    Talks on phone: Not on file    Gets together: Not on file    Attends religious service: Not on file    Active member of club or organization: Not on file    Attends meetings of clubs or organizations: Not on file    Relationship status: Not on file  . Intimate partner violence:    Fear of current or ex partner: Not on file    Emotionally abused: Not on file    Physically abused: Not on file    Forced sexual activity: Not on file  Other Topics Concern  . Not on file  Social History Narrative  . Not on file      Review of Systems  Constitutional: Negative.  Negative for chills, fatigue and unexpected weight change.  HENT: Negative.  Negative for congestion, rhinorrhea, sneezing and sore throat.   Eyes: Negative for redness.  Respiratory: Negative.  Negative for cough, chest tightness and shortness of breath.   Cardiovascular: Negative.  Negative for chest pain and palpitations.  Gastrointestinal: Negative.  Negative for abdominal pain, constipation, diarrhea, nausea and vomiting.  Endocrine: Negative.   Genitourinary: Negative.  Negative for dysuria and frequency.  Musculoskeletal: Negative.  Negative for arthralgias, back pain,  joint swelling and neck pain.  Skin: Negative.  Negative for rash.  Allergic/Immunologic: Negative.   Neurological: Negative.  Negative for tremors and numbness.  Hematological: Negative for adenopathy. Does not bruise/bleed easily.  Psychiatric/Behavioral: Negative.  Negative for behavioral problems, sleep disturbance and suicidal ideas. The patient is not nervous/anxious.     Vital Signs: BP 140/78   Pulse 87   Resp 16   Ht 6' (1.829 m)   Wt 227 lb (103 kg)   SpO2 97%   BMI 30.79 kg/m    Physical Exam Vitals signs and nursing note reviewed.  Constitutional:      General: He is not in acute distress.    Appearance: He is well-developed. He is not diaphoretic.  HENT:     Head: Normocephalic and atraumatic.     Mouth/Throat:     Pharynx: No oropharyngeal exudate.  Eyes:     Pupils: Pupils are equal, round, and reactive to light.  Neck:     Musculoskeletal: Normal range of motion and neck supple.     Thyroid: No thyromegaly.     Vascular: No JVD.     Trachea: No tracheal deviation.  Cardiovascular:     Rate and Rhythm: Normal rate and regular rhythm.     Heart sounds: Normal heart sounds. No murmur. No friction rub. No gallop.   Pulmonary:     Effort: Pulmonary effort is normal. No respiratory distress.     Breath sounds: Normal breath sounds. No wheezing or rales.  Chest:     Chest wall: No tenderness.  Abdominal:     Palpations: Abdomen is soft.     Tenderness: There is no abdominal tenderness. There is no guarding.  Musculoskeletal: Normal range of motion.  Lymphadenopathy:     Cervical: No cervical adenopathy.  Skin:    General: Skin is warm and dry.  Neurological:     Mental Status: He is alert and oriented to person, place, and  time.     Cranial Nerves: No cranial nerve deficit.  Psychiatric:        Behavior: Behavior normal.        Thought Content: Thought content normal.        Judgment: Judgment normal.    Assessment/Plan: 1. Attention and  concentration deficit Pt's adderal refilled at this time. - amphetamine-dextroamphetamine (ADDERALL) 30 MG tablet; Take 1 tablet by mouth 3 (three) times daily.  Dispense: 90 tablet; Refill: 0 - amphetamine-dextroamphetamine (ADDERALL) 30 MG tablet; Take 1 tablet by mouth daily.  Dispense: 90 tablet; Refill: 0 - amphetamine-dextroamphetamine (ADDERALL) 30 MG tablet; Take 1 tablet by mouth 3 (three) times daily.  Dispense: 90 tablet; Refill: 0 Refilled Controlled medications today. Reviewed risks and possible side effects associated with taking Stimulants. Combination of these drugs with other psychotropic medications could cause dizziness and drowsiness. Pt needs to Monitor symptoms and exercise caution in driving and operating heavy machinery to avoid damages to oneself, to others and to the surroundings. Patient verbalized understanding in this matter. Dependence and abuse for these drugs will be monitored closely. A Controlled substance policy and procedure is on file which allows [facility] to order a urine drug screen test at any visit. Patient understands and agrees with the plan..  2. Essential hypertension Stable, continue current medications as prescribed. Hypertension Counseling:   The following hypertensive lifestyle modification were recommended and discussed:  1. Limiting alcohol intake to less than 1 oz/day of ethanol:(24 oz of beer or 8 oz of wine or 2 oz of 100-proof whiskey). 2. Take baby ASA 81 mg daily. 3. Importance of regular aerobic exercise and losing weight. 4. Reduce dietary saturated fat and cholesterol intake for overall cardiovascular health. 5. Maintaining adequate dietary potassium, calcium, and magnesium intake. 6. Regular monitoring of the blood pressure. 7. Reduce sodium intake to less than 100 mmol/day (less than 2.3 gm of sodium or less than 6 gm of sodium choride)   3. Hyperlipidemia, unspecified hyperlipidemia type Patient's hyperlipidemia well  controlled at this time.  Will recheck lipid panel at next physical.  4. Hypersomnia Patient reports hypersomnia as well as snoring and not sleeping well.  Will have patient do home sleep study and follow-up with him after that. - Home sleep test  5. Snoring - Home sleep test  6. Chronic left shoulder pain Patient reports chronic left shoulder pain and some decreased range of motion that is going on for just over a year.  He reports he used to do yoga and thinks he hurt it during that time.  He feels like it is getting worse and he is having more pain will start with left shoulder films and possibly progressed to a CT.  Patient cannot have an MRI due to previous surgeries and metal in his body. - DG Shoulder Left; Future  7. Hypogonadism, testicular Patient continues to do well doing testosterone injections.  Will check testosterone level before next visit.  8. Depression, recurrent ([place]) Stable, patient denies any issues at this time.  General Counseling: aniceto kyser understanding of the findings of todays visit and agrees with plan of treatment. I have discussed any further diagnostic evaluation that may be needed or ordered today. We also reviewed his medications today. he has been encouraged to call the office with any questions or concerns that should arise related to todays visit.    Orders Placed This Encounter  Procedures  . DG Shoulder Left  . Home sleep test  Meds ordered this encounter  Medications  . amphetamine-dextroamphetamine (ADDERALL) 30 MG tablet    Sig: Take 1 tablet by mouth 3 (three) times daily.    Dispense:  90 tablet    Refill:  0  . DISCONTD: amphetamine-dextroamphetamine (ADDERALL) 30 MG tablet    Sig: Take 1 tablet by mouth daily.    Dispense:  90 tablet    Refill:  0    Do not fill before 05/14/2018  . amphetamine-dextroamphetamine (ADDERALL) 30 MG tablet    Sig: Take 1 tablet by mouth 3 (three) times daily.    Dispense:  90 tablet     Refill:  0    Do not fill before 06/13/2018  . Semaglutide (RYBELSUS) 3 MG TABS    Sig: Take 3 mg by mouth daily.    Dispense:  30 tablet    Refill:  0  . amphetamine-dextroamphetamine (ADDERALL) 30 MG tablet    Sig: Take 1 tablet by mouth 3 (three) times daily.    Dispense:  90 tablet    Refill:  0    Do not fill before 05/14/2018    Time spent: 25 Minutes   This patient was seen by Orson Gear AGNP-C in Collaboration with Dr Lavera Guise as a part of collaborative care agreement     Kendell Bane AGNP-C Internal medicine

## 2018-04-19 ENCOUNTER — Other Ambulatory Visit: Payer: Self-pay | Admitting: Adult Health

## 2018-04-25 ENCOUNTER — Other Ambulatory Visit: Payer: Self-pay | Admitting: Adult Health

## 2018-04-25 DIAGNOSIS — M25512 Pain in left shoulder: Secondary | ICD-10-CM

## 2018-04-25 NOTE — Progress Notes (Signed)
Ortho referral for shoulder pain.

## 2018-04-30 NOTE — Progress Notes (Signed)
[place]  Telephone:(336) [phone] Fax:(336) [phone]  ID: Randy Mclean Younger OB: 08/30/1960  MR#: [id number]  CBJ#:[id number]  Patient Care Team: Lavera Guise, MD as PCP - General (Internal Medicine) Bary Castilla Forest Gleason, MD (General Surgery) Lavera Guise, MD (Internal Medicine)  CHIEF COMPLAINT: Iron deficiency anemia.  INTERVAL HISTORY: Patient returns to clinic today for repeat laboratory work and further evaluation.  He states over the past several weeks his weakness and fatigue have become worse.  He otherwise feels well.  He has no neurologic complaints.  He denies any recent fevers.  He denies any chest pain or shortness of breath.  He has a good appetite and denies weight loss.  He denies any nausea, vomiting, constipation, or diarrhea.  He has no melena or hematochezia.  He has no urinary complaints.  Patient offers no further specific complaints today.  REVIEW OF SYSTEMS:   Review of Systems  Constitutional: Positive for malaise/fatigue. Negative for fever and weight loss.  Respiratory: Negative.  Negative for cough and shortness of breath.   Cardiovascular: Negative.  Negative for chest pain and leg swelling.  Gastrointestinal: Negative.  Negative for abdominal pain, blood in stool and melena.  Genitourinary: Negative.  Negative for hematuria.  Musculoskeletal: Negative.  Negative for back pain.  Skin: Negative.  Negative for rash.  Neurological: Positive for weakness. Negative for sensory change and focal weakness.  Psychiatric/Behavioral: Negative.  The patient is not nervous/anxious.     As per HPI. Otherwise, a complete review of systems is negative.  PAST MEDICAL HISTORY: Past Medical History:  Diagnosis Date  . Acute appendicitis 12/04/2017  . Allergy    takes allergy shots  . Anemia   . Arthritis   . Asthma   . Barrett esophagus   . Bronchitis   . Chronic pain   . Colon polyp   . Depression   . GERD (gastroesophageal reflux  disease)   . Hemorrhoid   . Hyperlipidemia   . Hypertension   . Hypothyroid   . IBS (irritable bowel syndrome)   . Kidney stone   . MI (myocardial infarction) ([place])   . Migraine   . Pneumonia   . Seizures ([place])   . Sinus problem   . Spinal cord stimulator status     PAST SURGICAL HISTORY: Past Surgical History:  Procedure Laterality Date  . APPENDECTOMY    . BACK SURGERY    . COLONOSCOPY  August 2015  . elbow surgery    . HEMORRHOID SURGERY    . LAPAROSCOPIC APPENDECTOMY N/A 12/04/2017   Procedure: APPENDECTOMY LAPAROSCOPIC;  Surgeon: Vickie Epley, MD;  Location: [facility] ORS;  Service: General;  Laterality: N/A;  . LIPOMA EXCISION Right    leg  . 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 SPINAL CORD STIMULATOR IMPLANT    . UPPER GI ENDOSCOPY  August 2015    FAMILY HISTORY Family History  Problem Relation Age of Onset  . Hypertension Mother   . Lung disease Mother   . Heart attack Father   . Diabetes Father        ADVANCED DIRECTIVES:    HEALTH MAINTENANCE: Social History   Tobacco Use  . Smoking status: Former Smoker    Packs/day: 1.00    Years: 30.00    Pack years: 30.00    Types: Cigarettes    Last attempt to quit: 02/22/2005    Years since quitting: 13.2  . Smokeless tobacco: Never Used  Substance  Use Topics  . Alcohol use: Yes    Alcohol/week: 0.0 standard drinks    Comment: occasionally  . Drug use: No    Comment: past     Colonoscopy:  PAP:  Bone density:  Lipid panel:  Allergies  Allergen Reactions  . Amoxicillin Anaphylaxis    REACTION: unspecified  . Penicillin G Anaphylaxis  . Gabapentin Other (See Comments)    REACTION: anaphylaxis  . Penicillins     REACTION: unspecified  . Adhesive [Tape] Rash    Band-aid    Current Outpatient Medications  Medication Sig Dispense Refill  . albuterol (PROVENTIL HFA;VENTOLIN HFA) 108 (90 Base) MCG/ACT inhaler Inhale 2 puffs into the lungs every 4 (four) hours as needed for wheezing  or shortness of breath. 1 Inhaler 5  . alfuzosin (UROXATRAL) 10 MG 24 hr tablet Take 1 tablet (10 mg total) by mouth daily. 90 tablet 3  . ALPRAZolam (XANAX) 1 MG tablet Take 1 tablet (1 mg total) by mouth 3 (three) times daily as needed for anxiety. 270 tablet 1  . amphetamine-dextroamphetamine (ADDERALL) 30 MG tablet Take 1 tablet by mouth 3 (three) times daily. 90 tablet 0  . amphetamine-dextroamphetamine (ADDERALL) 30 MG tablet Take 1 tablet by mouth 3 (three) times daily. 90 tablet 0  . [START ON 06/13/2018] amphetamine-dextroamphetamine (ADDERALL) 30 MG tablet Take 1 tablet by mouth 3 (three) times daily. 90 tablet 0  . [START ON 05/14/2018] amphetamine-dextroamphetamine (ADDERALL) 30 MG tablet Take 1 tablet by mouth 3 (three) times daily. 90 tablet 0  . atorvastatin (LIPITOR) 10 MG tablet Take 1 tablet (10 mg total) by mouth daily. 90 tablet 3  . azelastine (ASTELIN) 0.1 % nasal spray Place 1 spray into both nostrils 2 (two) times daily. 30 mL 1  . buPROPion (WELLBUTRIN XL) 300 MG 24 hr tablet Take 1 tablet (300 mg total) by mouth daily. 90 tablet 1  . chlorpheniramine-HYDROcodone (TUSSIONEX PENNKINETIC ER) 10-8 MG/5ML SUER Take 5 mLs by mouth every 12 (twelve) hours as needed for cough. 115 mL 0  . citalopram (CELEXA) 20 MG tablet TAKE 1 TABLET BY MOUTH DAILY 90 tablet 0  . clotrimazole-betamethasone (LOTRISONE) cream Apply 1 application topically 2 (two) times daily. 45 g 1  . esomeprazole (NEXIUM) 40 MG capsule TK ONE C PO  BID UTD  0  . l-methylfolate-B6-B12 (METANX) 3-35-2 MG TABS Take 1 tablet by mouth 2 (two) times daily.    . Levothyroxine Sodium (TIROSINT) 200 MCG CAPS Take 200 mcg by mouth daily. 90 capsule 2  . losartan (COZAAR) 50 MG tablet Take 1 tablet (50 mg total) by mouth daily. 90 tablet 3  . meloxicam (MOBIC) 7.5 MG tablet Take 1 tablet (7.5 mg total) by mouth 2 (two) times daily. 180 tablet 1  . metoprolol succinate (TOPROL-XL) 25 MG 24 hr tablet Take 1 tablet (25 mg total)  by mouth daily. 90 tablet 3  . mometasone-formoterol (DULERA) 100-5 MCG/ACT AERO Inhale 2 puffs into the lungs every 12 (twelve) hours. 3 Inhaler 3  . Multiple Vitamin (MULTI-VITAMINS) TABS Take by mouth.    Gean Birchwood ER 200 MG TB12 200 mg bid  0  . oxyCODONE-acetaminophen (PERCOCET) 7.5-325 MG per tablet Take 1 tablet by mouth every 4 (four) hours as needed for pain.     . pregabalin (LYRICA) 50 MG capsule Take 100 mg by mouth 3 (three) times daily.     . Semaglutide (RYBELSUS) 3 MG TABS Take 3 mg by mouth daily. 30 tablet 0  .  senna (SENOKOT) 8.6 MG tablet Take by mouth.    . SYRINGE-NEEDLE, DISP, 3 ML (MONOJECT 3CC SYR 22GX1") 22G X 1" 3 ML MISC Use as directed.    . testosterone cypionate (DEPOTESTOTERONE CYPIONATE) 100 MG/ML injection Inject 0.75 mLs (75 mg total) into the muscle every 7 (seven) days. For IM use only 10 mL 3  . TIROSINT 150 MCG CAPS TAKE ONE CAPSULE BY MOUTH EVERY MORNING FOR HYPOTHYROIDISM ONE-HALF HOUR BEFORE A MEAL 90 capsule 3   No current facility-administered medications for this visit.     OBJECTIVE: There were no vitals filed for this visit.   There is no height or weight on file to calculate BMI.    ECOG FS:0 - Asymptomatic  General: Well-developed, well-nourished, no acute distress. Eyes: Pink conjunctiva, anicteric sclera. HEENT: Normocephalic, moist mucous membranes. Lungs: Clear to auscultation bilaterally. Heart: Regular rate and rhythm. No rubs, murmurs, or gallops. Abdomen: Soft, nontender, nondistended. No organomegaly noted, normoactive bowel sounds. Musculoskeletal: No edema, cyanosis, or clubbing. Neuro: Alert, answering all questions appropriately. Cranial nerves grossly intact. Skin: No rashes or petechiae noted. Psych: Normal affect.  LAB RESULTS:  Lab Results  Component Value Date   NA 138 12/04/2017   K 3.8 12/04/2017   CL 101 12/04/2017   CO2 27 12/04/2017   GLUCOSE 86 12/04/2017   BUN 20 12/04/2017   CREATININE 1.16 12/04/2017    CALCIUM 8.2 (L) 12/04/2017   PROT 6.7 12/04/2017   ALBUMIN 3.9 12/04/2017   AST 20 12/04/2017   ALT 20 12/04/2017   ALKPHOS 64 12/04/2017   BILITOT 0.8 12/04/2017   GFRNONAA >60 12/04/2017   GFRAA >60 12/04/2017    Lab Results  Component Value Date   WBC 7.5 05/04/2018   NEUTROABS 4.2 05/04/2018   HGB 11.5 (L) 05/04/2018   HCT 37.0 (L) 05/04/2018   MCV 77.4 (L) 05/04/2018   PLT 268 05/04/2018   Lab Results  Component Value Date   IRON 39 (L) 05/04/2018   TIBC 447 05/04/2018   IRONPCTSAT 9 (L) 05/04/2018   Lab Results  Component Value Date   FERRITIN 7 (L) 05/04/2018     STUDIES: Dg Shoulder Left  Result Date: 04/14/2018 CLINICAL DATA:  Chronic left shoulder pain.  No known injury. EXAM: LEFT SHOULDER - 2+ VIEW COMPARISON:  None. FINDINGS: There is no evidence of fracture or dislocation. There is no evidence of arthropathy or other focal bone abnormality. Soft tissues are unremarkable. IMPRESSION: Negative exam. Electronically Signed   By: Inge Rise M.D.   On: 04/14/2018 16:25    ASSESSMENT: Iron deficiency anemia.  PLAN:    1.  Iron deficiency anemia: Patient's hemoglobin has trended down slightly to 11.5 and he is more symptomatic.  His iron stores remain significantly decreased.  Proceed with 510 mg IV Feraheme today.  Return to clinic in 1 week for a second infusion.  Patient will then return to clinic in 3 months with repeat laboratory work and further evaluation.    2. Family history of colon cancer:  Patient gets routine colonoscopies given his family history of colon cancer, all of which been unrevealing.  His next colonoscopy is due within the next year.  His genetic testing is negative.  I spent a total of 30 minutes face-to-face with the patient of which greater than 50% of the visit was spent in counseling and coordination of care as detailed above.  Patient expressed understanding and was in agreement with this plan. He also understands that He  can  call clinic at any time with any questions, concerns, or complaints.    Lloyd Huger, MD   05/06/2018 9:07 AM

## 2018-05-04 ENCOUNTER — Inpatient Hospital Stay: Payer: BLUE CROSS/BLUE SHIELD | Attending: Oncology

## 2018-05-04 ENCOUNTER — Inpatient Hospital Stay: Payer: BLUE CROSS/BLUE SHIELD

## 2018-05-04 ENCOUNTER — Inpatient Hospital Stay (HOSPITAL_BASED_OUTPATIENT_CLINIC_OR_DEPARTMENT_OTHER): Payer: BLUE CROSS/BLUE SHIELD | Admitting: Oncology

## 2018-05-04 ENCOUNTER — Other Ambulatory Visit: Payer: Self-pay

## 2018-05-04 VITALS — BP 118/65 | HR 70 | Resp 20

## 2018-05-04 DIAGNOSIS — E039 Hypothyroidism, unspecified: Secondary | ICD-10-CM | POA: Insufficient documentation

## 2018-05-04 DIAGNOSIS — D509 Iron deficiency anemia, unspecified: Secondary | ICD-10-CM

## 2018-05-04 DIAGNOSIS — Z79899 Other long term (current) drug therapy: Secondary | ICD-10-CM

## 2018-05-04 DIAGNOSIS — D508 Other iron deficiency anemias: Secondary | ICD-10-CM

## 2018-05-04 DIAGNOSIS — I10 Essential (primary) hypertension: Secondary | ICD-10-CM

## 2018-05-04 DIAGNOSIS — R5383 Other fatigue: Secondary | ICD-10-CM | POA: Diagnosis not present

## 2018-05-04 DIAGNOSIS — J45909 Unspecified asthma, uncomplicated: Secondary | ICD-10-CM | POA: Insufficient documentation

## 2018-05-04 DIAGNOSIS — Z87891 Personal history of nicotine dependence: Secondary | ICD-10-CM | POA: Diagnosis not present

## 2018-05-04 DIAGNOSIS — Z800 Family history of malignant neoplasm of digestive organs: Secondary | ICD-10-CM | POA: Diagnosis not present

## 2018-05-04 DIAGNOSIS — Z791 Long term (current) use of non-steroidal anti-inflammatories (NSAID): Secondary | ICD-10-CM

## 2018-05-04 DIAGNOSIS — E785 Hyperlipidemia, unspecified: Secondary | ICD-10-CM | POA: Diagnosis not present

## 2018-05-04 DIAGNOSIS — K219 Gastro-esophageal reflux disease without esophagitis: Secondary | ICD-10-CM

## 2018-05-04 DIAGNOSIS — R531 Weakness: Secondary | ICD-10-CM

## 2018-05-04 LAB — IRON AND TIBC
Iron: 39 ug/dL — ABNORMAL LOW (ref 45–182)
Saturation Ratios: 9 % — ABNORMAL LOW (ref 17.9–39.5)
TIBC: 447 ug/dL (ref 250–450)
UIBC: 408 ug/dL

## 2018-05-04 LAB — CBC WITH DIFFERENTIAL/PLATELET
Abs Immature Granulocytes: 0.02 10*3/uL (ref 0.00–0.07)
Basophils Absolute: 0.1 10*3/uL (ref 0.0–0.1)
Basophils Relative: 1 %
Eosinophils Absolute: 0.2 10*3/uL (ref 0.0–0.5)
Eosinophils Relative: 2 %
HCT: 37 % — ABNORMAL LOW (ref 39.0–52.0)
Hemoglobin: 11.5 g/dL — ABNORMAL LOW (ref 13.0–17.0)
Immature Granulocytes: 0 %
Lymphocytes Relative: 28 %
Lymphs Abs: 2.1 10*3/uL (ref 0.7–4.0)
MCH: 24.1 pg — ABNORMAL LOW (ref 26.0–34.0)
MCHC: 31.1 g/dL (ref 30.0–36.0)
MCV: 77.4 fL — ABNORMAL LOW (ref 80.0–100.0)
Monocytes Absolute: 1 10*3/uL (ref 0.1–1.0)
Monocytes Relative: 13 %
Neutro Abs: 4.2 10*3/uL (ref 1.7–7.7)
Neutrophils Relative %: 56 %
Platelets: 268 10*3/uL (ref 150–400)
RBC: 4.78 MIL/uL (ref 4.22–5.81)
RDW: 15.4 % (ref 11.5–15.5)
WBC: 7.5 10*3/uL (ref 4.0–10.5)
nRBC: 0 % (ref 0.0–0.2)

## 2018-05-04 LAB — FERRITIN: Ferritin: 7 ng/mL — ABNORMAL LOW (ref 24–336)

## 2018-05-04 MED ORDER — SODIUM CHLORIDE 0.9 % IV SOLN
510.0000 mg | Freq: Once | INTRAVENOUS | Status: AC
  Administered 2018-05-04: 510 mg via INTRAVENOUS
  Filled 2018-05-04: qty 17

## 2018-05-04 MED ORDER — SODIUM CHLORIDE 0.9 % IV SOLN
INTRAVENOUS | Status: DC
  Administered 2018-05-04: 15:00:00 via INTRAVENOUS
  Filled 2018-05-04: qty 250

## 2018-05-04 NOTE — Progress Notes (Signed)
Patient reports fatigue today.  

## 2018-05-10 DIAGNOSIS — M7582 Other shoulder lesions, left shoulder: Secondary | ICD-10-CM | POA: Diagnosis not present

## 2018-05-10 DIAGNOSIS — M5412 Radiculopathy, cervical region: Secondary | ICD-10-CM | POA: Diagnosis not present

## 2018-05-10 DIAGNOSIS — M79602 Pain in left arm: Secondary | ICD-10-CM | POA: Diagnosis not present

## 2018-05-10 DIAGNOSIS — M47812 Spondylosis without myelopathy or radiculopathy, cervical region: Secondary | ICD-10-CM | POA: Diagnosis not present

## 2018-05-10 DIAGNOSIS — G5602 Carpal tunnel syndrome, left upper limb: Secondary | ICD-10-CM | POA: Diagnosis not present

## 2018-05-11 ENCOUNTER — Inpatient Hospital Stay: Payer: BLUE CROSS/BLUE SHIELD

## 2018-05-11 ENCOUNTER — Other Ambulatory Visit: Payer: Self-pay

## 2018-05-11 VITALS — BP 130/65 | HR 18 | Temp 98.0°F | Resp 20

## 2018-05-11 DIAGNOSIS — D509 Iron deficiency anemia, unspecified: Secondary | ICD-10-CM | POA: Diagnosis not present

## 2018-05-11 DIAGNOSIS — Z800 Family history of malignant neoplasm of digestive organs: Secondary | ICD-10-CM | POA: Diagnosis not present

## 2018-05-11 DIAGNOSIS — R531 Weakness: Secondary | ICD-10-CM | POA: Diagnosis not present

## 2018-05-11 DIAGNOSIS — D508 Other iron deficiency anemias: Secondary | ICD-10-CM

## 2018-05-11 DIAGNOSIS — E039 Hypothyroidism, unspecified: Secondary | ICD-10-CM | POA: Diagnosis not present

## 2018-05-11 DIAGNOSIS — I10 Essential (primary) hypertension: Secondary | ICD-10-CM | POA: Diagnosis not present

## 2018-05-11 DIAGNOSIS — Z791 Long term (current) use of non-steroidal anti-inflammatories (NSAID): Secondary | ICD-10-CM | POA: Diagnosis not present

## 2018-05-11 DIAGNOSIS — K219 Gastro-esophageal reflux disease without esophagitis: Secondary | ICD-10-CM | POA: Diagnosis not present

## 2018-05-11 DIAGNOSIS — J45909 Unspecified asthma, uncomplicated: Secondary | ICD-10-CM | POA: Diagnosis not present

## 2018-05-11 DIAGNOSIS — R5383 Other fatigue: Secondary | ICD-10-CM | POA: Diagnosis not present

## 2018-05-11 DIAGNOSIS — Z87891 Personal history of nicotine dependence: Secondary | ICD-10-CM | POA: Diagnosis not present

## 2018-05-11 DIAGNOSIS — E785 Hyperlipidemia, unspecified: Secondary | ICD-10-CM | POA: Diagnosis not present

## 2018-05-11 DIAGNOSIS — Z79899 Other long term (current) drug therapy: Secondary | ICD-10-CM | POA: Diagnosis not present

## 2018-05-11 MED ORDER — SODIUM CHLORIDE 0.9 % IV SOLN
510.0000 mg | Freq: Once | INTRAVENOUS | Status: AC
  Administered 2018-05-11: 510 mg via INTRAVENOUS
  Filled 2018-05-11: qty 17

## 2018-05-11 MED ORDER — SODIUM CHLORIDE 0.9 % IV SOLN
INTRAVENOUS | Status: DC
  Administered 2018-05-11: 14:00:00 via INTRAVENOUS
  Filled 2018-05-11: qty 250

## 2018-05-15 ENCOUNTER — Other Ambulatory Visit: Payer: Self-pay | Admitting: Adult Health

## 2018-05-15 MED ORDER — SEMAGLUTIDE 7 MG PO TABS: 7.0000 mg | ORAL_TABLET | Freq: Every day | ORAL | 1 refills | Status: DC

## 2018-05-15 NOTE — Progress Notes (Signed)
Refilled patients Rybelsus, and increased dose to 7mg 

## 2018-05-22 ENCOUNTER — Ambulatory Visit: Payer: BLUE CROSS/BLUE SHIELD | Admitting: Nurse Practitioner

## 2018-05-22 VITALS — BP 128/69 | Resp 16 | Ht 72.0 in | Wt 215.8 lb

## 2018-05-22 DIAGNOSIS — G5602 Carpal tunnel syndrome, left upper limb: Secondary | ICD-10-CM | POA: Diagnosis not present

## 2018-05-22 DIAGNOSIS — I10 Essential (primary) hypertension: Secondary | ICD-10-CM

## 2018-05-22 DIAGNOSIS — G47429 Narcolepsy in conditions classified elsewhere without cataplexy: Secondary | ICD-10-CM

## 2018-05-22 DIAGNOSIS — R41840 Attention and concentration deficit: Secondary | ICD-10-CM

## 2018-05-22 MED ORDER — AMPHETAMINE-DEXTROAMPHETAMINE 30 MG PO TABS: 30.0000 mg | ORAL_TABLET | Freq: Three times a day (TID) | ORAL | 0 refills | Status: DC

## 2018-05-22 NOTE — Progress Notes (Addendum)
[facility] [place], [place] [address]  Internal MEDICINE  Office Visit Note  Patient Name: Randy Mclean  [id number]  [id number]  Date of Service: 05/23/2018  Chief Complaint  Patient presents with  . Hypertension  . Hyperlipidemia  . Follow-up    med follow up    The patient has been contacted via telephone for follow up visit due to concerns for spread of novel coronavirus. Has long history of HLD, HTN, and depression. His Blood pressure is well controlled at this time.  His hyperlipidemia as well as his depression are also well controlled.  Patient will continue current medications as prescribed. He currently seeing orthopedics for carpal tunnel syndrome. They have recommended he have surgical repair of this and he wishes to hold of on this because of threat of COVID 19.  He does need to have refills for adderall 30mg . He takes this three times daily. He takes this for both narcolepsy and attention deficit. He states this medication does well for him and he does need to have refills.       Current Medication: Outpatient Encounter Medications as of 05/22/2018  Medication Sig  . albuterol (PROVENTIL HFA;VENTOLIN HFA) 108 (90 Base) MCG/ACT inhaler Inhale 2 puffs into the lungs every 4 (four) hours as needed for wheezing or shortness of breath.  . alfuzosin (UROXATRAL) 10 MG 24 hr tablet Take 1 tablet (10 mg total) by mouth daily.  Marland Kitchen ALPRAZolam (XANAX) 1 MG tablet Take 1 tablet (1 mg total) by mouth 3 (three) times daily as needed for anxiety.  Marland Kitchen amphetamine-dextroamphetamine (ADDERALL) 30 MG tablet Take 1 tablet by mouth 3 (three) times daily.  Marland Kitchen atorvastatin (LIPITOR) 10 MG tablet Take 1 tablet (10 mg total) by mouth daily.  Marland Kitchen azelastine (ASTELIN) 0.1 % nasal spray Place 1 spray into both nostrils 2 (two) times daily.  Marland Kitchen buPROPion (WELLBUTRIN XL) 300 MG 24 hr tablet Take 1 tablet (300 mg total) by mouth daily.  . chlorpheniramine-HYDROcodone  (TUSSIONEX PENNKINETIC ER) 10-8 MG/5ML SUER Take 5 mLs by mouth every 12 (twelve) hours as needed for cough.  . citalopram (CELEXA) 20 MG tablet TAKE 1 TABLET BY MOUTH DAILY  . clotrimazole-betamethasone (LOTRISONE) cream Apply 1 application topically 2 (two) times daily.  Marland Kitchen esomeprazole (NEXIUM) 40 MG capsule TK ONE C PO  BID UTD  . l-methylfolate-B6-B12 (METANX) 3-35-2 MG TABS Take 1 tablet by mouth 2 (two) times daily.  . Levothyroxine Sodium (TIROSINT) 200 MCG CAPS Take 200 mcg by mouth daily.  Marland Kitchen losartan (COZAAR) 50 MG tablet Take 1 tablet (50 mg total) by mouth daily.  . meloxicam (MOBIC) 7.5 MG tablet Take 1 tablet (7.5 mg total) by mouth 2 (two) times daily.  . metoprolol succinate (TOPROL-XL) 25 MG 24 hr tablet Take 1 tablet (25 mg total) by mouth daily.  . mometasone-formoterol (DULERA) 100-5 MCG/ACT AERO Inhale 2 puffs into the lungs every 12 (twelve) hours.  . Multiple Vitamin (MULTI-VITAMINS) TABS Take by mouth.  Gean Birchwood ER 200 MG TB12 200 mg bid  . oxyCODONE-acetaminophen (PERCOCET) 7.5-325 MG per tablet Take 1 tablet by mouth every 4 (four) hours as needed for pain.   . pregabalin (LYRICA) 50 MG capsule Take 100 mg by mouth 3 (three) times daily.   . Semaglutide (RYBELSUS) 7 MG TABS Take 7 mg by mouth daily.  Marland Kitchen senna (SENOKOT) 8.6 MG tablet Take by mouth.  . SYRINGE-NEEDLE, DISP, 3 ML (MONOJECT 3CC SYR 22GX1") 22G X 1" 3  ML MISC Use as directed.  . testosterone cypionate (DEPOTESTOTERONE CYPIONATE) 100 MG/ML injection Inject 0.75 mLs (75 mg total) into the muscle every 7 (seven) days. For IM use only  . TIROSINT 150 MCG CAPS TAKE ONE CAPSULE BY MOUTH EVERY MORNING FOR HYPOTHYROIDISM ONE-HALF HOUR BEFORE A MEAL  . [DISCONTINUED] amphetamine-dextroamphetamine (ADDERALL) 30 MG tablet Take 1 tablet by mouth 3 (three) times daily.  . [DISCONTINUED] amphetamine-dextroamphetamine (ADDERALL) 30 MG tablet Take 1 tablet by mouth 3 (three) times daily.  . [DISCONTINUED]  amphetamine-dextroamphetamine (ADDERALL) 30 MG tablet Take 1 tablet by mouth 3 (three) times daily.  . [DISCONTINUED] amphetamine-dextroamphetamine (ADDERALL) 30 MG tablet Take 1 tablet by mouth 3 (three) times daily.  . [DISCONTINUED] amphetamine-dextroamphetamine (ADDERALL) 30 MG tablet Take 1 tablet by mouth 3 (three) times daily.   No facility-administered encounter medications on file as of 05/22/2018.     Surgical History: Past Surgical History:  Procedure Laterality Date  . APPENDECTOMY    . BACK SURGERY    . COLONOSCOPY  August 2015  . elbow surgery    . HEMORRHOID SURGERY    . LAPAROSCOPIC APPENDECTOMY N/A 12/04/2017   Procedure: APPENDECTOMY LAPAROSCOPIC;  Surgeon: Vickie Epley, MD;  Location: [facility] ORS;  Service: General;  Laterality: N/A;  . LIPOMA EXCISION Right    leg  . 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 SPINAL CORD STIMULATOR IMPLANT    . UPPER GI ENDOSCOPY  August 2015    Medical History: Past Medical History:  Diagnosis Date  . Acute appendicitis 12/04/2017  . Allergy    takes allergy shots  . Anemia   . Arthritis   . Asthma   . Barrett esophagus   . Bronchitis   . Chronic pain   . Colon polyp   . Depression   . GERD (gastroesophageal reflux disease)   . Hemorrhoid   . Hyperlipidemia   . Hypertension   . Hypothyroid   . IBS (irritable bowel syndrome)   . Kidney stone   . MI (myocardial infarction) ([place])   . Migraine   . Pneumonia   . Seizures ([place])   . Sinus problem   . Spinal cord stimulator status     Family History: Family History  Problem Relation Age of Onset  . Hypertension Mother   . Lung disease Mother   . Heart attack Father   . Diabetes Father     Social History   Socioeconomic History  . Marital status: Married    Spouse name: Not on file  . Number of children: Not on file  . Years of education: Not on file  . Highest education level: Not on file  Occupational History  . Not on file  Social Needs  .  Financial resource strain: Not on file  . Food insecurity:    Worry: Not on file    Inability: Not on file  . Transportation needs:    Medical: Not on file    Non-medical: Not on file  Tobacco Use  . Smoking status: Former Smoker    Packs/day: 1.00    Years: 30.00    Pack years: 30.00    Types: Cigarettes    Last attempt to quit: 02/22/2005    Years since quitting: 13.2  . Smokeless tobacco: Never Used  Substance and Sexual Activity  . Alcohol use: Yes    Alcohol/week: 0.0 standard drinks    Comment: occasionally  . Drug use: No    Comment: past  .  Sexual activity: Yes  Lifestyle  . Physical activity:    Days per week: Not on file    Minutes per session: Not on file  . Stress: Not on file  Relationships  . Social connections:    Talks on phone: Not on file    Gets together: Not on file    Attends religious service: Not on file    Active member of club or organization: Not on file    Attends meetings of clubs or organizations: Not on file    Relationship status: Not on file  . Intimate partner violence:    Fear of current or ex partner: Not on file    Emotionally abused: Not on file    Physically abused: Not on file    Forced sexual activity: Not on file  Other Topics Concern  . Not on file  Social History Narrative  . Not on file      Review of Systems  Constitutional: Negative for chills, fatigue and unexpected weight change.  HENT: Negative for congestion, rhinorrhea, sneezing and sore throat.   Respiratory: Negative for cough, chest tightness, shortness of breath and wheezing.   Cardiovascular: Negative for chest pain and palpitations.  Gastrointestinal: Negative for abdominal pain, constipation, diarrhea, nausea and vomiting.  Endocrine: Negative for cold intolerance, heat intolerance, polydipsia and polyuria.  Musculoskeletal: Positive for arthralgias, back pain, myalgias and neck pain. Negative for joint swelling.       Seeing orthopedics who has  recommended he have carpal tunnel release procedure.   Skin: Negative.  Negative for rash.  Allergic/Immunologic: Positive for environmental allergies.  Neurological: Negative for tremors, numbness and headaches.  Hematological: Negative for adenopathy. Does not bruise/bleed easily.  Psychiatric/Behavioral: Negative.  Negative for behavioral problems, sleep disturbance and suicidal ideas. The patient is not nervous/anxious.     Today's Vitals   05/22/18 1612  BP: 128/69  Resp: 16  Weight: 215 lb 12.8 oz (97.9 kg)  Height: 6' (1.829 m)   Body mass index is 29.27 kg/m.  Assessment/Plan: 1. Carpal tunnel syndrome of left wrist Discussed recommendation for procedure from orthopedics. Advised patient to hold off this or any elective procedures until risk for contracting COVID 19 improves. He agrees with this plan. He will take pain medication as prescribed per orthopedics. Recommended he continue to wear carpal tunnel splints when possible.   2. Essential hypertension Continue bp medication as prescribed.   3. Narcolepsy due to underlying condition without cataplexy May continue with prescribed dose of adderall 30mg  three times dily if needed. Two 30 day precriptions provided. Dates are 05/22/2018 and 06/20/2018.  - amphetamine-dextroamphetamine (ADDERALL) 30 MG tablet; Take 1 tablet by mouth 3 (three) times daily.  Dispense: 90 tablet; Refill: 0  4. Attention and concentration deficit May continue with prescribed dose of adderall 30mg  three times dily if needed. Two 30 day precriptions provided. Dates are 05/22/2018 and 06/20/2018.  - amphetamine-dextroamphetamine (ADDERALL) 30 MG tablet; Take 1 tablet by mouth 3 (three) times daily.  Dispense: 90 tablet; Refill: 0  General Counseling: Randy Mclean verbalizes understanding of the findings of todays visit and agrees with plan of treatment. I have discussed any further diagnostic evaluation that may be needed or ordered today. We also reviewed his  medications today. he has been encouraged to call the office with any questions or concerns that should arise related to todays visit.  Refilled Controlled medications today. Reviewed risks and possible side effects associated with taking Stimulants. Combination of these drugs with other  psychotropic medications could cause dizziness and drowsiness. Pt needs to Monitor symptoms and exercise caution in driving and operating heavy machinery to avoid damages to oneself, to others and to the surroundings. Patient verbalized understanding in this matter. Dependence and abuse for these drugs will be monitored closely. A Controlled substance policy and procedure is on file which allows [facility] to order a urine drug screen test at any visit. Patient understands and agrees with the plan..  This patient was seen by Leretha Pol FNP Collaboration with Dr Lavera Guise as a part of collaborative care agreement  Meds ordered this encounter  Medications  . DISCONTD: amphetamine-dextroamphetamine (ADDERALL) 30 MG tablet    Sig: Take 1 tablet by mouth 3 (three) times daily.    Dispense:  90 tablet    Refill:  0    Do not fill before 03/14/2018    Order Specific Question:   Supervising Provider    Answer:   Lavera Guise [[id number]]  . amphetamine-dextroamphetamine (ADDERALL) 30 MG tablet    Sig: Take 1 tablet by mouth 3 (three) times daily.    Dispense:  90 tablet    Refill:  0    Fill after 06/20/2018    Order Specific Question:   Supervising Provider    Answer:   Lavera Guise [[id number]]    Time spent: 25 Minutes      Dr Lavera Guise Internal medicine

## 2018-05-23 ENCOUNTER — Encounter: Payer: Self-pay | Admitting: Nurse Practitioner

## 2018-05-23 DIAGNOSIS — G47429 Narcolepsy in conditions classified elsewhere without cataplexy: Secondary | ICD-10-CM | POA: Insufficient documentation

## 2018-05-24 NOTE — Addendum Note (Signed)
Addended by: Leretha Pol on: 05/24/2018 05:38 PM   Modules accepted: Level of Service

## 2018-05-25 NOTE — Addendum Note (Signed)
Addended by: Leretha Pol on: 05/25/2018 01:38 PM   Modules accepted: Level of Service

## 2018-06-08 ENCOUNTER — Other Ambulatory Visit: Payer: Self-pay | Admitting: Adult Health

## 2018-06-08 NOTE — Telephone Encounter (Signed)
Last here 3/20

## 2018-06-08 NOTE — Telephone Encounter (Signed)
Send

## 2018-06-09 DIAGNOSIS — G894 Chronic pain syndrome: Secondary | ICD-10-CM | POA: Diagnosis not present

## 2018-06-09 DIAGNOSIS — M961 Postlaminectomy syndrome, not elsewhere classified: Secondary | ICD-10-CM | POA: Diagnosis not present

## 2018-06-09 DIAGNOSIS — M5417 Radiculopathy, lumbosacral region: Secondary | ICD-10-CM | POA: Diagnosis not present

## 2018-06-09 DIAGNOSIS — Z79891 Long term (current) use of opiate analgesic: Secondary | ICD-10-CM | POA: Diagnosis not present

## 2018-06-09 NOTE — Telephone Encounter (Signed)
I have not seen his testesterone,  PSA and fasting lipid profile in last 6 months, please find out from the pt if he had his labs drawn, I have to see both levels before sending his RX for Testosterone  He needs Free testosterone and total testosterone level, fasting lipid profile, and PSA level

## 2018-06-14 DIAGNOSIS — M47812 Spondylosis without myelopathy or radiculopathy, cervical region: Secondary | ICD-10-CM | POA: Diagnosis not present

## 2018-06-14 DIAGNOSIS — M79645 Pain in left finger(s): Secondary | ICD-10-CM | POA: Diagnosis not present

## 2018-06-14 DIAGNOSIS — M5412 Radiculopathy, cervical region: Secondary | ICD-10-CM | POA: Diagnosis not present

## 2018-06-14 DIAGNOSIS — M1812 Unilateral primary osteoarthritis of first carpometacarpal joint, left hand: Secondary | ICD-10-CM | POA: Diagnosis not present

## 2018-06-15 ENCOUNTER — Other Ambulatory Visit: Payer: Self-pay | Admitting: Student

## 2018-06-15 DIAGNOSIS — M47812 Spondylosis without myelopathy or radiculopathy, cervical region: Secondary | ICD-10-CM

## 2018-06-15 DIAGNOSIS — M5412 Radiculopathy, cervical region: Secondary | ICD-10-CM

## 2018-06-20 DIAGNOSIS — D509 Iron deficiency anemia, unspecified: Secondary | ICD-10-CM | POA: Diagnosis not present

## 2018-06-20 DIAGNOSIS — K2270 Barrett's esophagus without dysplasia: Secondary | ICD-10-CM | POA: Diagnosis not present

## 2018-06-20 DIAGNOSIS — Z86010 Personal history of colonic polyps: Secondary | ICD-10-CM | POA: Diagnosis not present

## 2018-06-20 DIAGNOSIS — Z800 Family history of malignant neoplasm of digestive organs: Secondary | ICD-10-CM | POA: Diagnosis not present

## 2018-07-13 ENCOUNTER — Ambulatory Visit: Payer: BLUE CROSS/BLUE SHIELD | Admitting: Nurse Practitioner

## 2018-07-13 ENCOUNTER — Encounter: Payer: Self-pay | Admitting: Nurse Practitioner

## 2018-07-13 ENCOUNTER — Other Ambulatory Visit: Payer: Self-pay

## 2018-07-13 VITALS — Ht 72.0 in | Wt 210.0 lb

## 2018-07-13 DIAGNOSIS — I10 Essential (primary) hypertension: Secondary | ICD-10-CM

## 2018-07-13 DIAGNOSIS — E039 Hypothyroidism, unspecified: Secondary | ICD-10-CM | POA: Diagnosis not present

## 2018-07-13 DIAGNOSIS — G47429 Narcolepsy in conditions classified elsewhere without cataplexy: Secondary | ICD-10-CM | POA: Diagnosis not present

## 2018-07-13 MED ORDER — AMPHETAMINE-DEXTROAMPHETAMINE 30 MG PO TABS: 30.0000 mg | ORAL_TABLET | Freq: Three times a day (TID) | ORAL | 0 refills | Status: DC

## 2018-07-13 NOTE — Progress Notes (Signed)
[facility] [place], [place] [address]  Internal MEDICINE  Telephone Visit  Patient Name: Randy Mclean  [id number]  [id number]  Date of Service: 07/29/2018  I connected with the patient at 4:15pm by webcam and verified the patients identity using two identifiers.   I discussed the limitations, risks, security and privacy concerns of performing an evaluation and management service by webcam and the availability of in person appointments. I also discussed with the patient that there may be a patient responsible charge related to the service.  The patient expressed understanding and agrees to proceed.    Chief Complaint  Patient presents with  . Telephone Screen    VIDEO VISIT [phone]  . Telephone Assessment  . Medical Management of Chronic Issues    3 month follow up medication refills  . Fatigue    pt stated that he is physically drained, more cranky and ill, also in a lot of pain in lower back, currently seeing pain specialist but he wont be in until next week    The patient has been contacted via webcam for follow up visit due to concerns for spread of novel coronavirus. Today, he is complaining of fatigue, irritability, and increased lower back pain. His pain management provider is not in the office until Tuesday. He states that prednisone taper does not usually pain.  Labs were done in march and was found to be anemic. He had iron infusion and is due to go back for follow up in June, 2020. He is scheduled to have colonoscopy and endoscopy when gI starts performing elective and screening procedures again.       Current Medication: Outpatient Encounter Medications as of 07/13/2018  Medication Sig  . albuterol (PROVENTIL HFA;VENTOLIN HFA) 108 (90 Base) MCG/ACT inhaler Inhale 2 puffs into the lungs every 4 (four) hours as needed for wheezing or shortness of breath.  . alfuzosin (UROXATRAL) 10 MG 24 hr tablet Take 1 tablet (10 mg total) by mouth  daily.  Marland Kitchen amphetamine-dextroamphetamine (ADDERALL) 30 MG tablet Take 1 tablet by mouth 3 (three) times daily.  Marland Kitchen atorvastatin (LIPITOR) 10 MG tablet Take 1 tablet (10 mg total) by mouth daily.  Marland Kitchen azelastine (ASTELIN) 0.1 % nasal spray Place 1 spray into both nostrils 2 (two) times daily.  Marland Kitchen buPROPion (WELLBUTRIN XL) 300 MG 24 hr tablet Take 1 tablet (300 mg total) by mouth daily.  . chlorpheniramine-HYDROcodone (TUSSIONEX PENNKINETIC ER) 10-8 MG/5ML SUER Take 5 mLs by mouth every 12 (twelve) hours as needed for cough.  . citalopram (CELEXA) 20 MG tablet TAKE 1 TABLET BY MOUTH DAILY  . clotrimazole-betamethasone (LOTRISONE) cream Apply 1 application topically 2 (two) times daily.  Marland Kitchen esomeprazole (NEXIUM) 40 MG capsule TK ONE C PO  BID UTD  . l-methylfolate-B6-B12 (METANX) 3-35-2 MG TABS Take 1 tablet by mouth 2 (two) times daily.  . Levothyroxine Sodium (TIROSINT) 200 MCG CAPS Take 200 mcg by mouth daily.  Marland Kitchen losartan (COZAAR) 50 MG tablet Take 1 tablet (50 mg total) by mouth daily.  . meloxicam (MOBIC) 7.5 MG tablet Take 1 tablet (7.5 mg total) by mouth 2 (two) times daily.  . metoprolol succinate (TOPROL-XL) 25 MG 24 hr tablet Take 1 tablet (25 mg total) by mouth daily.  . mometasone-formoterol (DULERA) 100-5 MCG/ACT AERO Inhale 2 puffs into the lungs every 12 (twelve) hours.  . Multiple Vitamin (MULTI-VITAMINS) TABS Take by mouth.  Gean Birchwood ER 200 MG TB12 200 mg bid  . oxyCODONE-acetaminophen (PERCOCET)  7.5-325 MG per tablet Take 1 tablet by mouth every 4 (four) hours as needed for pain.   . pregabalin (LYRICA) 50 MG capsule Take 100 mg by mouth 3 (three) times daily.   Marland Kitchen senna (SENOKOT) 8.6 MG tablet Take by mouth.  . SYRINGE-NEEDLE, DISP, 3 ML (MONOJECT 3CC SYR 22GX1") 22G X 1" 3 ML MISC Use as directed.  . testosterone cypionate (DEPOTESTOSTERONE CYPIONATE) 200 MG/ML injection INJECT 0.75MLS (75MG  TOTAL) INTO THE MUSCLE EVERY 7 DAYS AS DIRECTED  . TIROSINT 150 MCG CAPS TAKE ONE CAPSULE BY  MOUTH EVERY MORNING FOR HYPOTHYROIDISM ONE-HALF HOUR BEFORE A MEAL  . [DISCONTINUED] ALPRAZolam (XANAX) 1 MG tablet Take 1 tablet (1 mg total) by mouth 3 (three) times daily as needed for anxiety.  . [DISCONTINUED] amphetamine-dextroamphetamine (ADDERALL) 30 MG tablet Take 1 tablet by mouth 3 (three) times daily.  . [DISCONTINUED] amphetamine-dextroamphetamine (ADDERALL) 30 MG tablet Take 1 tablet by mouth 3 (three) times daily.  . [DISCONTINUED] amphetamine-dextroamphetamine (ADDERALL) 30 MG tablet Take 1 tablet by mouth 3 (three) times daily.  . [DISCONTINUED] Semaglutide (RYBELSUS) 7 MG TABS Take 7 mg by mouth daily.   No facility-administered encounter medications on file as of 07/13/2018.     Surgical History: Past Surgical History:  Procedure Laterality Date  . APPENDECTOMY    . BACK SURGERY    . COLONOSCOPY  August 2015  . elbow surgery    . HEMORRHOID SURGERY    . LAPAROSCOPIC APPENDECTOMY N/A 12/04/2017   Procedure: APPENDECTOMY LAPAROSCOPIC;  Surgeon: Vickie Epley, MD;  Location: [facility] ORS;  Service: General;  Laterality: N/A;  . LIPOMA EXCISION Right    leg  . 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 SPINAL CORD STIMULATOR IMPLANT    . UPPER GI ENDOSCOPY  August 2015    Medical History: Past Medical History:  Diagnosis Date  . Acute appendicitis 12/04/2017  . Allergy    takes allergy shots  . Anemia   . Arthritis   . Asthma   . Barrett esophagus   . Bronchitis   . Chronic pain   . Colon polyp   . Depression   . GERD (gastroesophageal reflux disease)   . Hemorrhoid   . Hyperlipidemia   . Hypertension   . Hypothyroid   . IBS (irritable bowel syndrome)   . Kidney stone   . MI (myocardial infarction) ([place])   . Migraine   . Pneumonia   . Seizures ([place])   . Sinus problem   . Spinal cord stimulator status     Family History: Family History  Problem Relation Age of Onset  . Hypertension Mother   . Lung disease Mother   . Heart attack Father    . Diabetes Father     Social History   Socioeconomic History  . Marital status: Married    Spouse name: Not on file  . Number of children: Not on file  . Years of education: Not on file  . Highest education level: Not on file  Occupational History  . Not on file  Social Needs  . Financial resource strain: Not on file  . Food insecurity:    Worry: Not on file    Inability: Not on file  . Transportation needs:    Medical: Not on file    Non-medical: Not on file  Tobacco Use  . Smoking status: Former Smoker    Packs/day: 1.00    Years: 30.00    Pack years: 30.00  Types: Cigarettes    Last attempt to quit: 02/22/2005    Years since quitting: 13.4  . Smokeless tobacco: Never Used  Substance and Sexual Activity  . Alcohol use: Yes    Alcohol/week: 0.0 standard drinks    Comment: occasionally  . Drug use: No    Comment: past  . Sexual activity: Yes  Lifestyle  . Physical activity:    Days per week: Not on file    Minutes per session: Not on file  . Stress: Not on file  Relationships  . Social connections:    Talks on phone: Not on file    Gets together: Not on file    Attends religious service: Not on file    Active member of club or organization: Not on file    Attends meetings of clubs or organizations: Not on file    Relationship status: Not on file  . Intimate partner violence:    Fear of current or ex partner: Not on file    Emotionally abused: Not on file    Physically abused: Not on file    Forced sexual activity: Not on file  Other Topics Concern  . Not on file  Social History Narrative  . Not on file      Review of Systems  Constitutional: Positive for fatigue. Negative for chills and unexpected weight change.  HENT: Negative for congestion, rhinorrhea, sneezing and sore throat.   Respiratory: Positive for wheezing. Negative for cough, chest tightness and shortness of breath.   Cardiovascular: Negative for chest pain and palpitations.   Gastrointestinal: Negative for abdominal pain, constipation, diarrhea, nausea and vomiting.  Endocrine: Negative for cold intolerance, heat intolerance, polydipsia and polyuria.  Musculoskeletal: Positive for arthralgias, back pain, myalgias and neck pain. Negative for joint swelling.       Seeing orthopedics who has recommended he have carpal tunnel release procedure.   Skin: Negative.  Negative for rash.  Allergic/Immunologic: Positive for environmental allergies.  Neurological: Negative for tremors, numbness and headaches.  Hematological: Negative for adenopathy. Does not bruise/bleed easily.  Psychiatric/Behavioral: Positive for decreased concentration. Negative for behavioral problems, sleep disturbance and suicidal ideas. The patient is nervous/anxious.     Today's Vitals   07/13/18 1550  Weight: 210 lb (95.3 kg)  Height: 6' (1.829 m)   Body mass index is 28.48 kg/m.  Observation/Objective:  The patient is alert and oriented. He is pleasant and answering all questions appropriately. Breathing is non-labored. He is in no acute distress.    Assessment/Plan: 1. Essential hypertension Stable. Continue bp medication as prescribed   2. Acquired hypothyroidism Recent thyroid panel normal.   3. Narcolepsy due to underlying condition without cataplexy Ok to continue adderall 30mg  three times daily as needed for focus and fatigue. Three 30 day prescriptions provided. Dates are 07/13/2018, 08/11/2018, and 09/08/2018 - amphetamine-dextroamphetamine (ADDERALL) 30 MG tablet; Take 1 tablet by mouth 3 (three) times daily.  Dispense: 90 tablet; Refill: 0   General Counseling: Zamere verbalizes understanding of the findings of today's phone visit and agrees with plan of treatment. I have discussed any further diagnostic evaluation that may be needed or ordered today. We also reviewed his medications today. he has been encouraged to call the office with any questions or concerns that should  arise related to todays visit.  Refilled Controlled medications today. Reviewed risks and possible side effects associated with taking Stimulants. Combination of these drugs with other psychotropic medications could cause dizziness and drowsiness. Pt needs to Monitor symptoms  and exercise caution in driving and operating heavy machinery to avoid damages to oneself, to others and to the surroundings. Patient verbalized understanding in this matter. Dependence and abuse for these drugs will be monitored closely. A Controlled substance policy and procedure is on file which allows [facility] to order a urine drug screen test at any visit. Patient understands and agrees with the plan.  This patient was seen by [place] with Dr Lavera Guise as a part of collaborative care agreement  Meds ordered this encounter  Medications  . DISCONTD: amphetamine-dextroamphetamine (ADDERALL) 30 MG tablet    Sig: Take 1 tablet by mouth 3 (three) times daily.    Dispense:  90 tablet    Refill:  0    Order Specific Question:   Supervising Provider    Answer:   Lavera Guise [[id number]]  . DISCONTD: amphetamine-dextroamphetamine (ADDERALL) 30 MG tablet    Sig: Take 1 tablet by mouth 3 (three) times daily.    Dispense:  90 tablet    Refill:  0    Fill after 08/11/2018    Order Specific Question:   Supervising Provider    Answer:   Lavera Guise [[id number]]  . amphetamine-dextroamphetamine (ADDERALL) 30 MG tablet    Sig: Take 1 tablet by mouth 3 (three) times daily.    Dispense:  90 tablet    Refill:  0    Fill after 7/17/220    Order Specific Question:   Supervising Provider    Answer:   Lavera Guise [[id number]]    Time spent: 70 Minutes    Dr Lavera Guise Internal medicine

## 2018-07-18 ENCOUNTER — Other Ambulatory Visit: Payer: Self-pay | Admitting: Adult Health

## 2018-07-18 NOTE — Telephone Encounter (Signed)
I don't see a recent hg Aic on him, will need to monitor calcium and thryoid level on him

## 2018-07-19 ENCOUNTER — Other Ambulatory Visit: Payer: Self-pay | Admitting: Adult Health

## 2018-07-19 DIAGNOSIS — F411 Generalized anxiety disorder: Secondary | ICD-10-CM

## 2018-07-19 DIAGNOSIS — F410 Panic disorder [episodic paroxysmal anxiety] without agoraphobia: Secondary | ICD-10-CM

## 2018-07-19 NOTE — Telephone Encounter (Signed)
Last appt 07/13/18

## 2018-07-20 NOTE — Telephone Encounter (Signed)
Pt needs to be seen in the office

## 2018-07-27 ENCOUNTER — Other Ambulatory Visit: Payer: Self-pay

## 2018-07-27 ENCOUNTER — Ambulatory Visit
Admission: RE | Admit: 2018-07-27 | Discharge: 2018-07-27 | Disposition: A | Discharge status: Home / Self Care | Payer: BC Managed Care – PPO | Source: Ambulatory Visit | Attending: Student | Admitting: Student

## 2018-07-27 DIAGNOSIS — M47812 Spondylosis without myelopathy or radiculopathy, cervical region: Secondary | ICD-10-CM

## 2018-07-27 DIAGNOSIS — M5412 Radiculopathy, cervical region: Secondary | ICD-10-CM

## 2018-07-31 NOTE — Progress Notes (Signed)
Spoke with patient about instructions for procedure on Friday. Patient is to stop wellbutrin, celexa, lyrica, adderall. Patient was told one adderall would be find due to his narcolepsy. Pt voiced understanding about instructions.

## 2018-08-04 ENCOUNTER — Ambulatory Visit
Admission: RE | Admit: 2018-08-04 | Discharge: 2018-08-04 | Disposition: A | Discharge status: Home / Self Care | Payer: BC Managed Care – PPO | Source: Ambulatory Visit | Attending: Student | Admitting: Student

## 2018-08-04 ENCOUNTER — Other Ambulatory Visit: Payer: Self-pay

## 2018-08-04 DIAGNOSIS — M5412 Radiculopathy, cervical region: Secondary | ICD-10-CM | POA: Insufficient documentation

## 2018-08-04 DIAGNOSIS — M5023 Other cervical disc displacement, cervicothoracic region: Secondary | ICD-10-CM | POA: Diagnosis not present

## 2018-08-04 DIAGNOSIS — M50222 Other cervical disc displacement at C5-C6 level: Secondary | ICD-10-CM | POA: Diagnosis not present

## 2018-08-04 DIAGNOSIS — M47812 Spondylosis without myelopathy or radiculopathy, cervical region: Secondary | ICD-10-CM | POA: Insufficient documentation

## 2018-08-04 LAB — APTT: aPTT: 34 seconds (ref 24–36)

## 2018-08-04 LAB — CBC
HCT: 44.7 % (ref 39.0–52.0)
Hemoglobin: 14 g/dL (ref 13.0–17.0)
MCH: 26.3 pg (ref 26.0–34.0)
MCHC: 31.3 g/dL (ref 30.0–36.0)
MCV: 83.9 fL (ref 80.0–100.0)
Platelets: 206 10*3/uL (ref 150–400)
RBC: 5.33 MIL/uL (ref 4.22–5.81)
RDW: 17.4 % — ABNORMAL HIGH (ref 11.5–15.5)
WBC: 6.9 10*3/uL (ref 4.0–10.5)
nRBC: 0 % (ref 0.0–0.2)

## 2018-08-04 LAB — PROTIME-INR
INR: 1 (ref 0.8–1.2)
Prothrombin Time: 13.1 seconds (ref 11.4–15.2)

## 2018-08-04 MED ORDER — IOPAMIDOL (ISOVUE-M 300) INJECTION 61%
10.0000 mL | Freq: Once | INTRAMUSCULAR | Status: AC | PRN
  Administered 2018-08-04: 10 mL via INTRATHECAL

## 2018-08-04 MED ORDER — ACETAMINOPHEN 500 MG PO TABS
1000.0000 mg | ORAL_TABLET | Freq: Four times a day (QID) | ORAL | Status: DC | PRN
  Filled 2018-08-04: qty 2

## 2018-08-04 NOTE — Procedures (Signed)
Interventional Radiology Procedure Note  Procedure: Cervical myelogram  Complications: None  Estimated Blood Loss: None  Recommendations: - Bedrest x 4 hrs - DC home  Signed,  Criselda Peaches, MD

## 2018-08-07 DIAGNOSIS — Z79891 Long term (current) use of opiate analgesic: Secondary | ICD-10-CM | POA: Diagnosis not present

## 2018-08-07 DIAGNOSIS — M961 Postlaminectomy syndrome, not elsewhere classified: Secondary | ICD-10-CM | POA: Diagnosis not present

## 2018-08-07 DIAGNOSIS — G894 Chronic pain syndrome: Secondary | ICD-10-CM | POA: Diagnosis not present

## 2018-08-07 DIAGNOSIS — M4722 Other spondylosis with radiculopathy, cervical region: Secondary | ICD-10-CM | POA: Diagnosis not present

## 2018-08-21 ENCOUNTER — Ambulatory Visit: Payer: BLUE CROSS/BLUE SHIELD | Admitting: Oncology

## 2018-08-21 ENCOUNTER — Other Ambulatory Visit: Payer: BLUE CROSS/BLUE SHIELD

## 2018-08-21 ENCOUNTER — Ambulatory Visit: Payer: BLUE CROSS/BLUE SHIELD

## 2018-08-21 DIAGNOSIS — M5020 Other cervical disc displacement, unspecified cervical region: Secondary | ICD-10-CM | POA: Diagnosis not present

## 2018-08-21 DIAGNOSIS — M50323 Other cervical disc degeneration at C6-C7 level: Secondary | ICD-10-CM | POA: Diagnosis not present

## 2018-08-21 DIAGNOSIS — Z01818 Encounter for other preprocedural examination: Secondary | ICD-10-CM | POA: Diagnosis not present

## 2018-08-21 DIAGNOSIS — M542 Cervicalgia: Secondary | ICD-10-CM | POA: Diagnosis not present

## 2018-08-25 ENCOUNTER — Other Ambulatory Visit: Payer: Self-pay | Admitting: Adult Health

## 2018-08-28 ENCOUNTER — Other Ambulatory Visit: Payer: Self-pay

## 2018-08-28 ENCOUNTER — Other Ambulatory Visit: Payer: Self-pay | Admitting: Adult Health

## 2018-08-28 MED ORDER — ATORVASTATIN CALCIUM 10 MG PO TABS: 10.0000 mg | ORAL_TABLET | Freq: Every day | ORAL | 3 refills | Status: DC

## 2018-08-28 MED ORDER — METOPROLOL SUCCINATE ER 25 MG PO TB24: 25.0000 mg | ORAL_TABLET | Freq: Every day | ORAL | 1 refills | Status: DC

## 2018-08-30 ENCOUNTER — Ambulatory Visit: Payer: BC Managed Care – PPO | Admitting: Nurse Practitioner

## 2018-08-30 ENCOUNTER — Other Ambulatory Visit: Payer: Self-pay

## 2018-08-30 ENCOUNTER — Encounter: Payer: Self-pay | Admitting: Adult Health

## 2018-08-30 VITALS — BP 136/68 | HR 80 | Resp 16 | Ht 72.0 in | Wt 218.0 lb

## 2018-08-30 DIAGNOSIS — F410 Panic disorder [episodic paroxysmal anxiety] without agoraphobia: Secondary | ICD-10-CM

## 2018-08-30 DIAGNOSIS — Z79899 Other long term (current) drug therapy: Secondary | ICD-10-CM

## 2018-08-30 DIAGNOSIS — M50022 Cervical disc disorder at C5-C6 level with myelopathy: Secondary | ICD-10-CM | POA: Insufficient documentation

## 2018-08-30 DIAGNOSIS — Z01818 Encounter for other preprocedural examination: Secondary | ICD-10-CM | POA: Insufficient documentation

## 2018-08-30 DIAGNOSIS — E039 Hypothyroidism, unspecified: Secondary | ICD-10-CM | POA: Diagnosis not present

## 2018-08-30 DIAGNOSIS — G47429 Narcolepsy in conditions classified elsewhere without cataplexy: Secondary | ICD-10-CM

## 2018-08-30 DIAGNOSIS — F411 Generalized anxiety disorder: Secondary | ICD-10-CM

## 2018-08-30 DIAGNOSIS — R41840 Attention and concentration deficit: Secondary | ICD-10-CM

## 2018-08-30 DIAGNOSIS — M542 Cervicalgia: Secondary | ICD-10-CM | POA: Diagnosis not present

## 2018-08-30 LAB — POCT URINE DRUG SCREEN
POC Amphetamine UR: POSITIVE — AB
POC BENZODIAZEPINES UR: NOT DETECTED
POC Barbiturate UR: NOT DETECTED
POC Cocaine UR: NOT DETECTED
POC Ecstasy UR: NOT DETECTED
POC Marijuana UR: NOT DETECTED
POC Methadone UR: NOT DETECTED
POC Methamphetamine UR: NOT DETECTED
POC Opiate Ur: NOT DETECTED
POC Oxycodone UR: POSITIVE — AB
POC PHENCYCLIDINE UR: NOT DETECTED
POC TRICYCLICS UR: NOT DETECTED
URINE TEMPERATURE: 96 Degrees F (ref 90.0–100.0)

## 2018-08-30 MED ORDER — METOPROLOL SUCCINATE ER 25 MG PO TB24: 25.0000 mg | ORAL_TABLET | Freq: Every day | ORAL | 1 refills | Status: DC

## 2018-08-30 MED ORDER — AMPHETAMINE-DEXTROAMPHETAMINE 20 MG PO TABS: 20.0000 mg | ORAL_TABLET | Freq: Three times a day (TID) | ORAL | 0 refills | Status: DC | PRN

## 2018-08-30 MED ORDER — ALPRAZOLAM 0.5 MG PO TABS: 0.5000 mg | ORAL_TABLET | Freq: Three times a day (TID) | ORAL | 2 refills | Status: DC | PRN

## 2018-08-30 NOTE — Progress Notes (Signed)
[facility] [place], [place] [address]  Internal MEDICINE  Office Visit Note  Patient Name: Randy Mclean  [id number]  [id number]  Date of Service: 08/30/2018  Chief Complaint  Patient presents with  . Medical Management of Chronic Issues    medication refill adhd  . Medical Clearance    surgical clearance     The patient is here for pre-op clearance. He is going to have surgical fusion of C5/C6/C7. His neurosurgeon told him that he had essentially no discs between these vertebra and this needs to be repaired. The surgeon is Dr. Pamala Hurry at [facility] Results of ECG should be sent to Moshe Salisbury, who is his nurse. The patient's surgery is scheduled for 09/19/2018. He continues to have moderate weakness of the left upper extremity and this is gradually getting worse.  The patient is also needing to have refills for adderall. He is narcoleptic and also uses this to aid in concentration and focus. He used to take provigil for this, however, his insurance stopped covering that medication and offered adderall as alternative. He does well on current doses. Currently working at home, so not having to drove or stay focused for long periods of time. He does also have history of depression and anxiety. Symptoms are generally well managed. Discussed referral to psychiatry for continued medication management. He prefers not to do this and agrees to give lower dose alprazolam a try.        Current Medication: Outpatient Encounter Medications as of 08/30/2018  Medication Sig  . albuterol (PROVENTIL HFA;VENTOLIN HFA) 108 (90 Base) MCG/ACT inhaler Inhale 2 puffs into the lungs every 4 (four) hours as needed for wheezing or shortness of breath.  . alfuzosin (UROXATRAL) 10 MG 24 hr tablet Take 1 tablet (10 mg total) by mouth daily.  Marland Kitchen ALPRAZolam (XANAX) 0.5 MG tablet Take 1 tablet (0.5 mg total) by mouth 3 (three) times daily as needed. for anxiety  . atorvastatin  (LIPITOR) 10 MG tablet Take 1 tablet (10 mg total) by mouth daily.  Marland Kitchen azelastine (ASTELIN) 0.1 % nasal spray Place 1 spray into both nostrils 2 (two) times daily.  Marland Kitchen buPROPion (WELLBUTRIN XL) 300 MG 24 hr tablet Take 1 tablet (300 mg total) by mouth daily.  . chlorpheniramine-HYDROcodone (TUSSIONEX PENNKINETIC ER) 10-8 MG/5ML SUER Take 5 mLs by mouth every 12 (twelve) hours as needed for cough.  . citalopram (CELEXA) 20 MG tablet TAKE 1 TABLET BY MOUTH DAILY  . clotrimazole-betamethasone (LOTRISONE) cream Apply 1 application topically 2 (two) times daily.  Marland Kitchen esomeprazole (NEXIUM) 40 MG capsule TK ONE C PO  BID UTD  . l-methylfolate-B6-B12 (METANX) 3-35-2 MG TABS Take 1 tablet by mouth 2 (two) times daily.  . Levothyroxine Sodium (TIROSINT) 200 MCG CAPS Take 200 mcg by mouth daily.  Marland Kitchen losartan (COZAAR) 50 MG tablet Take 1 tablet (50 mg total) by mouth daily.  . meloxicam (MOBIC) 7.5 MG tablet TAKE 1 TABLET BY MOUTH 2 TIMES DAILY  . metoprolol succinate (TOPROL-XL) 25 MG 24 hr tablet Take 1 tablet (25 mg total) by mouth daily.  . mometasone-formoterol (DULERA) 100-5 MCG/ACT AERO Inhale 2 puffs into the lungs every 12 (twelve) hours.  . Multiple Vitamin (MULTI-VITAMINS) TABS Take by mouth.  Gean Birchwood ER 200 MG TB12 200 mg bid  . oxyCODONE-acetaminophen (PERCOCET) 7.5-325 MG per tablet Take 1 tablet by mouth every 4 (four) hours as needed for pain.   . pregabalin (LYRICA) 50 MG capsule Take 100 mg  by mouth 3 (three) times daily.   . RYBELSUS 7 MG TABS TAKE 1 TABLET BY MOUTH DAILY  . senna (SENOKOT) 8.6 MG tablet Take by mouth.  . SYRINGE-NEEDLE, DISP, 3 ML (MONOJECT 3CC SYR 22GX1") 22G X 1" 3 ML MISC Use as directed.  . testosterone cypionate (DEPOTESTOSTERONE CYPIONATE) 200 MG/ML injection INJECT 0.75MLS (75MG  TOTAL) INTO THE MUSCLE EVERY 7 DAYS AS DIRECTED  . TIROSINT 150 MCG CAPS TAKE ONE CAPSULE BY MOUTH EVERY MORNING FOR HYPOTHYROIDISM ONE-HALF HOUR BEFORE A MEAL  . [DISCONTINUED] ALPRAZolam  (XANAX) 1 MG tablet TAKE 1 TABLET BY MOUTH THREE TIMES DAILY AS NEEDED FOR ANXIETY  . [DISCONTINUED] amphetamine-dextroamphetamine (ADDERALL) 30 MG tablet Take 1 tablet by mouth 3 (three) times daily.  Marland Kitchen amphetamine-dextroamphetamine (ADDERALL) 20 MG tablet Take 1 tablet (20 mg total) by mouth 3 (three) times daily as needed.  . [DISCONTINUED] amphetamine-dextroamphetamine (ADDERALL) 20 MG tablet Take 1 tablet (20 mg total) by mouth 3 (three) times daily as needed.   No facility-administered encounter medications on file as of 08/30/2018.     Surgical History: Past Surgical History:  Procedure Laterality Date  . APPENDECTOMY    . BACK SURGERY    . COLONOSCOPY  August 2015  . elbow surgery    . HEMORRHOID SURGERY    . LAPAROSCOPIC APPENDECTOMY N/A 12/04/2017   Procedure: APPENDECTOMY LAPAROSCOPIC;  Surgeon: Vickie Epley, MD;  Location: [facility] ORS;  Service: General;  Laterality: N/A;  . LIPOMA EXCISION Right    leg  . 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 SPINAL CORD STIMULATOR IMPLANT    . UPPER GI ENDOSCOPY  August 2015    Medical History: Past Medical History:  Diagnosis Date  . Acute appendicitis 12/04/2017  . Allergy    takes allergy shots  . Anemia   . Arthritis   . Asthma   . Barrett esophagus   . Bronchitis   . Chronic pain   . Colon polyp   . Depression   . GERD (gastroesophageal reflux disease)   . Hemorrhoid   . Hyperlipidemia   . Hypertension   . Hypothyroid   . IBS (irritable bowel syndrome)   . Kidney stone   . MI (myocardial infarction) ([place])    between 2002 and 2006  . Migraine   . Pneumonia   . Seizures ([place])   . Sinus problem   . Spinal cord stimulator status     Family History: Family History  Problem Relation Age of Onset  . Hypertension Mother   . Lung disease Mother   . Heart attack Father   . Diabetes Father     Social History   Socioeconomic History  . Marital status: Married    Spouse name: Not on file  . Number  of children: Not on file  . Years of education: Not on file  . Highest education level: Not on file  Occupational History  . Not on file  Social Needs  . Financial resource strain: Not on file  . Food insecurity    Worry: Not on file    Inability: Not on file  . Transportation needs    Medical: Not on file    Non-medical: Not on file  Tobacco Use  . Smoking status: Former Smoker    Packs/day: 1.00    Years: 30.00    Pack years: 30.00    Types: Cigarettes    Quit date: 02/22/2005    Years since quitting: 13.5  .  Smokeless tobacco: Never Used  Substance and Sexual Activity  . Alcohol use: Yes    Alcohol/week: 0.0 standard drinks    Comment: occasionally  . Drug use: No    Comment: past  . Sexual activity: Yes  Lifestyle  . Physical activity    Days per week: Not on file    Minutes per session: Not on file  . Stress: Not on file  Relationships  . Social Herbalist on phone: Not on file    Gets together: Not on file    Attends religious service: Not on file    Active member of club or organization: Not on file    Attends meetings of clubs or organizations: Not on file    Relationship status: Not on file  . Intimate partner violence    Fear of current or ex partner: Not on file    Emotionally abused: Not on file    Physically abused: Not on file    Forced sexual activity: Not on file  Other Topics Concern  . Not on file  Social History Narrative  . Not on file      Review of Systems  Constitutional: Positive for fatigue. Negative for chills and unexpected weight change.  HENT: Negative for congestion, rhinorrhea, sneezing and sore throat.   Respiratory: Negative for cough, chest tightness, shortness of breath and wheezing.   Cardiovascular: Negative for chest pain and palpitations.  Gastrointestinal: Negative for abdominal pain, constipation, diarrhea, nausea and vomiting.  Endocrine: Negative for cold intolerance, heat intolerance, polydipsia and  polyuria.  Musculoskeletal: Positive for arthralgias, back pain, myalgias and neck pain. Negative for joint swelling.       Patient seeing neurosurgeon. Is gong to have cervical fusion of C5, C6, and C7.   Skin: Negative for rash.  Allergic/Immunologic: Positive for environmental allergies.  Neurological: Negative for dizziness, tremors, numbness and headaches.  Hematological: Negative for adenopathy. Does not bruise/bleed easily.  Psychiatric/Behavioral: Positive for dysphoric mood and sleep disturbance. Negative for behavioral problems and suicidal ideas. The patient is nervous/anxious.        Well managed on current medication.     Today's Vitals   08/30/18 1012  BP: 136/68  Pulse: 80  Resp: 16  SpO2: 95%  Weight: 218 lb (98.9 kg)  Height: 6' (1.829 m)   Body mass index is 29.57 kg/m.  Physical Exam Vitals signs and nursing note reviewed.  Constitutional:      General: He is not in acute distress.    Appearance: Normal appearance. He is well-developed. He is not diaphoretic.  HENT:     Head: Normocephalic and atraumatic.     Mouth/Throat:     Pharynx: No oropharyngeal exudate.  Eyes:     Pupils: Pupils are equal, round, and reactive to light.  Neck:     Musculoskeletal: Normal range of motion and neck supple.     Thyroid: No thyromegaly.     Vascular: No carotid bruit or JVD.     Trachea: No tracheal deviation.  Cardiovascular:     Rate and Rhythm: Normal rate and regular rhythm.     Heart sounds: Normal heart sounds. No murmur. No friction rub. No gallop.      Comments: ECG showing mild biatrial enlargement.  Pulmonary:     Effort: Pulmonary effort is normal. No respiratory distress.     Breath sounds: Normal breath sounds. No wheezing or rales.  Chest:     Chest wall: No tenderness.  Abdominal:     Palpations: Abdomen is soft.     Tenderness: There is no abdominal tenderness. There is no guarding.  Lymphadenopathy:     Cervical: No cervical adenopathy.   Skin:    General: Skin is warm and dry.  Neurological:     Mental Status: He is alert and oriented to person, place, and time.     Cranial Nerves: No cranial nerve deficit.  Psychiatric:        Behavior: Behavior normal.        Thought Content: Thought content normal.        Judgment: Judgment normal.    Assessment/Plan:  1. Cervical disc disorder at C5-C6 level with myelopathy Patient scheduled to have cervical disc fusion on 09/19/2018. Surgical clearance done today. He is good to have surgery with mild to moderate risk due to history of MI and HTN. ECG reassuring today.   2. Preoperative clearance - EKG 12-Lead Surgical clearance done today. He is good to have surgery with mild to moderate risk due to history of MI and HTN. ECG reassuring today.   3. Acquired hypothyroidism Continue tirosint as prescribed  4. Generalized anxiety disorder with panic attacks Decrease dose of alprazolam to 0.5mg  tablets. May take up to three times daily if needed for acute anxiety. New prescription sent to his pharmacy. - ALPRAZolam (XANAX) 0.5 MG tablet; Take 1 tablet (0.5 mg total) by mouth 3 (three) times daily as needed. for anxiety  Dispense: 90 tablet; Refill: 2  5. Narcolepsy due to underlying condition without cataplexy May continue adderall, but reduced dose to 20mg  three times daily if needed. Two thirty day prescriptions were sent to his pharmacy. Dates are 08/30/2018 and 09/28/2018 - amphetamine-dextroamphetamine (ADDERALL) 20 MG tablet; Take 1 tablet (20 mg total) by mouth 3 (three) times daily as needed.  Dispense: 90 tablet; Refill: 0  6. History of long-term treatment with high-risk medication - POCT Urine Drug Screen appropriately positive for BZO and AMP.   7. Attention and concentration deficit May continue adderall, but reduced dose to 20mg  three times daily if needed. Two thirty day prescriptions were sent to his pharmacy. Dates are 08/30/2018 and 09/28/2018 -  amphetamine-dextroamphetamine (ADDERALL) 20 MG tablet; Take 1 tablet (20 mg total) by mouth 3 (three) times daily as needed.  Dispense: 90 tablet; Refill: 0  General Counseling: Benjy verbalizes understanding of the findings of todays visit and agrees with plan of treatment. I have discussed any further diagnostic evaluation that may be needed or ordered today. We also reviewed his medications today. he has been encouraged to call the office with any questions or concerns that should arise related to todays visit.  Reviewed risks and possible side effects associated with taking opiates, benzodiazepines and other CNS depressants. Combination of these could cause dizziness and drowsiness. Advised patient not to drive or operate machinery when taking these medications, as patient's and other's life can be at risk and will have consequences. Patient verbalized understanding in this matter. Dependence and abuse for these drugs will be monitored closely. A Controlled substance policy and procedure is on file which allows [facility] to order a urine drug screen test at any visit. Patient understands and agrees with the plan  This patient was seen by Leretha Pol FNP Collaboration with Dr Lavera Guise as a part of collaborative care agreement  Orders Placed This Encounter  Procedures  . POCT Urine Drug Screen  . EKG 12-Lead    Meds ordered this  encounter  Medications  . ALPRAZolam (XANAX) 0.5 MG tablet    Sig: Take 1 tablet (0.5 mg total) by mouth 3 (three) times daily as needed. for anxiety    Dispense:  90 tablet    Refill:  2    Order Specific Question:   Supervising Provider    Answer:   Lavera Guise [[id number]]  . DISCONTD: amphetamine-dextroamphetamine (ADDERALL) 20 MG tablet    Sig: Take 1 tablet (20 mg total) by mouth 3 (three) times daily as needed.    Dispense:  90 tablet    Refill:  0    Please note decrease in dose.    Order Specific Question:   Supervising Provider     Answer:   Lavera Guise [[id number]]  . amphetamine-dextroamphetamine (ADDERALL) 20 MG tablet    Sig: Take 1 tablet (20 mg total) by mouth 3 (three) times daily as needed.    Dispense:  90 tablet    Refill:  0    Fill after 07/29/2018    Order Specific Question:   Supervising Provider    Answer:   Lavera Guise [[id number]]    Time spent: 69 Minutes      Dr Lavera Guise Internal medicine

## 2018-08-30 NOTE — Telephone Encounter (Signed)
Last testosterone and PSA levels done 11/2017.  Has CPE scheduled for next month and will get labs ordered again then.

## 2018-08-31 ENCOUNTER — Telehealth: Payer: Self-pay | Admitting: Nurse Practitioner

## 2018-08-31 NOTE — Telephone Encounter (Signed)
Faxed SX Clear with notes and labs and put copy in scan. Beth

## 2018-09-01 ENCOUNTER — Other Ambulatory Visit: Payer: Self-pay

## 2018-09-01 DIAGNOSIS — M542 Cervicalgia: Secondary | ICD-10-CM | POA: Diagnosis not present

## 2018-09-01 MED ORDER — AZELASTINE HCL 0.1 % NA SOLN: 1.0000 | Freq: Two times a day (BID) | NASAL | 1 refills | Status: DC

## 2018-09-04 ENCOUNTER — Other Ambulatory Visit: Payer: Self-pay | Admitting: Adult Health

## 2018-09-04 ENCOUNTER — Other Ambulatory Visit: Payer: Self-pay

## 2018-09-04 DIAGNOSIS — M542 Cervicalgia: Secondary | ICD-10-CM | POA: Diagnosis not present

## 2018-09-04 MED ORDER — AZELASTINE HCL 137 MCG/SPRAY NA SOLN: 1.0000 | Freq: Two times a day (BID) | NASAL | 1 refills | Status: DC

## 2018-09-08 DIAGNOSIS — M542 Cervicalgia: Secondary | ICD-10-CM | POA: Diagnosis not present

## 2018-09-09 NOTE — Progress Notes (Signed)
[place]  [phone] [phone]  ID: Randy Mclean Younger OB: 1960/08/11  MR#: [id number]  TLX#:[id number]  Patient Care Team: Lavera Guise, MD as PCP - General (Internal Medicine) Bary Castilla Forest Gleason, MD (General Surgery) Lavera Guise, MD (Internal Medicine)  CHIEF COMPLAINT: Iron deficiency anemia.  INTERVAL HISTORY: Patient returns to clinic today for repeat laboratory work, further evaluation, and consideration of IV Feraheme.  He has chronic neck pain and is having surgery in the next month.  He otherwise feels well.  He does not complain of weakness or fatigue today.  He has no neurologic complaints.  He denies any recent fevers or illnesses.  He has no chest pain, shortness of breath, cough, or hemoptysis.  He denies any nausea, vomiting, constipation, or diarrhea.  He has no melena or hematochezia.  He has no urinary complaints.  Patient offers no further specific complaints today.  REVIEW OF SYSTEMS:   Review of Systems  Constitutional: Negative.  Negative for fever, malaise/fatigue and weight loss.  Respiratory: Negative.  Negative for cough and shortness of breath.   Cardiovascular: Negative.  Negative for chest pain and leg swelling.  Gastrointestinal: Negative.  Negative for abdominal pain, blood in stool and melena.  Genitourinary: Negative.  Negative for hematuria.  Musculoskeletal: Positive for neck pain. Negative for back pain.  Skin: Negative.  Negative for rash.  Neurological: Negative for sensory change, focal weakness and weakness.  Psychiatric/Behavioral: Negative.  The patient is not nervous/anxious.     As per HPI. Otherwise, a complete review of systems is negative.  PAST MEDICAL HISTORY: Past Medical History:  Diagnosis Date  . Acute appendicitis 12/04/2017  . Allergy    takes allergy shots  . Anemia   . Arthritis   . Asthma   . Barrett esophagus   . Bronchitis   . Chronic pain   . Colon polyp   .  Depression   . GERD (gastroesophageal reflux disease)   . Hemorrhoid   . Hyperlipidemia   . Hypertension   . Hypothyroid   . IBS (irritable bowel syndrome)   . Kidney stone   . MI (myocardial infarction) ([place])    between 2002 and 2006  . Migraine   . Pneumonia   . Seizures ([place])   . Sinus problem   . Spinal cord stimulator status     PAST SURGICAL HISTORY: Past Surgical History:  Procedure Laterality Date  . APPENDECTOMY    . BACK SURGERY    . COLONOSCOPY  August 2015  . elbow surgery    . HEMORRHOID SURGERY    . LAPAROSCOPIC APPENDECTOMY N/A 12/04/2017   Procedure: APPENDECTOMY LAPAROSCOPIC;  Surgeon: Vickie Epley, MD;  Location: [facility] ORS;  Service: General;  Laterality: N/A;  . LIPOMA EXCISION Right    leg  . 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 SPINAL CORD STIMULATOR IMPLANT    . UPPER GI ENDOSCOPY  August 2015    FAMILY HISTORY Family History  Problem Relation Age of Onset  . Hypertension Mother   . Lung disease Mother   . Heart attack Father   . Diabetes Father        ADVANCED DIRECTIVES:    HEALTH MAINTENANCE: Social History   Tobacco Use  . Smoking status: Former Smoker    Packs/day: 1.00    Years: 30.00    Pack years: 30.00    Types: Cigarettes    Quit date: 02/22/2005    Years  since quitting: 13.5  . Smokeless tobacco: Never Used  Substance Use Topics  . Alcohol use: Yes    Alcohol/week: 0.0 standard drinks    Comment: occasionally  . Drug use: No    Comment: past     Colonoscopy:  PAP:  Bone density:  Lipid panel:  Allergies  Allergen Reactions  . Amoxicillin Anaphylaxis    REACTION: unspecified  . Penicillin G Anaphylaxis  . Gabapentin Other (See Comments)    REACTION: anaphylaxis  . Penicillins     REACTION: unspecified  . Adhesive [Tape] Rash    Band-aid    Current Outpatient Medications  Medication Sig Dispense Refill  . albuterol (PROVENTIL HFA;VENTOLIN HFA) 108 (90 Base) MCG/ACT inhaler Inhale 2 puffs  into the lungs every 4 (four) hours as needed for wheezing or shortness of breath. 1 Inhaler 5  . alfuzosin (UROXATRAL) 10 MG 24 hr tablet Take 1 tablet (10 mg total) by mouth daily. 90 tablet 3  . ALPRAZolam (XANAX) 0.5 MG tablet Take 1 tablet (0.5 mg total) by mouth 3 (three) times daily as needed. for anxiety 90 tablet 2  . amphetamine-dextroamphetamine (ADDERALL) 20 MG tablet Take 1 tablet (20 mg total) by mouth 3 (three) times daily as needed. 90 tablet 0  . atorvastatin (LIPITOR) 10 MG tablet Take 1 tablet (10 mg total) by mouth daily. 90 tablet 3  . Azelastine HCl 137 MCG/SPRAY SOLN Place 1 spray into the nose 2 (two) times a day. 90 mL 1  . buPROPion (WELLBUTRIN XL) 300 MG 24 hr tablet Take 1 tablet (300 mg total) by mouth daily. 90 tablet 1  . citalopram (CELEXA) 20 MG tablet TAKE 1 TABLET BY MOUTH DAILY 90 tablet 0  . esomeprazole (NEXIUM) 40 MG capsule TK ONE C PO  BID UTD  0  . l-methylfolate-B6-B12 (METANX) 3-35-2 MG TABS Take 1 tablet by mouth 2 (two) times daily.    . Levothyroxine Sodium (TIROSINT) 200 MCG CAPS Take 200 mcg by mouth daily. 90 capsule 2  . losartan (COZAAR) 50 MG tablet Take 1 tablet (50 mg total) by mouth daily. 90 tablet 3  . meloxicam (MOBIC) 7.5 MG tablet TAKE 1 TABLET BY MOUTH 2 TIMES DAILY 180 tablet 1  . metoprolol succinate (TOPROL-XL) 25 MG 24 hr tablet Take 1 tablet (25 mg total) by mouth daily. 90 tablet 1  . mometasone-formoterol (DULERA) 100-5 MCG/ACT AERO Inhale 2 puffs into the lungs every 12 (twelve) hours. 3 Inhaler 3  . Multiple Vitamin (MULTI-VITAMINS) TABS Take by mouth.    Gean Birchwood ER 200 MG TB12 200 mg bid  0  . oxyCODONE-acetaminophen (PERCOCET) 7.5-325 MG per tablet Take 1 tablet by mouth every 4 (four) hours as needed for pain.     . pregabalin (LYRICA) 50 MG capsule Take 100 mg by mouth 3 (three) times daily.     Marland Kitchen senna (SENOKOT) 8.6 MG tablet Take by mouth.    . SYRINGE-NEEDLE, DISP, 3 ML (MONOJECT 3CC SYR 22GX1") 22G X 1" 3 ML MISC Use  as directed.    . testosterone cypionate (DEPOTESTOSTERONE CYPIONATE) 200 MG/ML injection INJECT 0.75MLS (75MG  TOTAL) INTO THE MUSCLE EVERY 7 DAYS AS DIRECTED 10 mL 1  . chlorpheniramine-HYDROcodone (TUSSIONEX PENNKINETIC ER) 10-8 MG/5ML SUER Take 5 mLs by mouth every 12 (twelve) hours as needed for cough. (Patient not taking: Reported on 09/12/2018) 115 mL 0  . clotrimazole-betamethasone (LOTRISONE) cream Apply 1 application topically 2 (two) times daily. 45 g 1  . RYBELSUS 7 MG  TABS TAKE 1 TABLET BY MOUTH DAILY (Patient not taking: Reported on 09/12/2018) 30 tablet 1  . TIROSINT 150 MCG CAPS TAKE ONE CAPSULE BY MOUTH EVERY MORNING FOR HYPOTHYROIDISM ONE-HALF HOUR BEFORE A MEAL (Patient not taking: Reported on 09/12/2018) 90 capsule 3   No current facility-administered medications for this visit.     OBJECTIVE: Vitals:   09/12/18 1109  BP: 118/68  Pulse: 85  Temp: (!) 97.2 F (36.2 C)     Body mass index is 29.43 kg/m.    ECOG FS:0 - Asymptomatic  General: Well-developed, well-nourished, no acute distress. Eyes: Pink conjunctiva, anicteric sclera. HEENT: Normocephalic, moist mucous membranes. Lungs: Clear to auscultation bilaterally. Heart: Regular rate and rhythm. No rubs, murmurs, or gallops. Abdomen: Soft, nontender, nondistended. No organomegaly noted, normoactive bowel sounds. Musculoskeletal: No edema, cyanosis, or clubbing. Neuro: Alert, answering all questions appropriately. Cranial nerves grossly intact. Skin: No rashes or petechiae noted. Psych: Normal affect.  LAB RESULTS:  Lab Results  Component Value Date   NA 138 12/04/2017   K 3.8 12/04/2017   CL 101 12/04/2017   CO2 27 12/04/2017   GLUCOSE 86 12/04/2017   BUN 20 12/04/2017   CREATININE 1.16 12/04/2017   CALCIUM 8.2 (L) 12/04/2017   PROT 6.7 12/04/2017   ALBUMIN 3.9 12/04/2017   AST 20 12/04/2017   ALT 20 12/04/2017   ALKPHOS 64 12/04/2017   BILITOT 0.8 12/04/2017   GFRNONAA >60 12/04/2017   GFRAA >60  12/04/2017    Lab Results  Component Value Date   WBC 7.3 09/12/2018   NEUTROABS 4.6 09/12/2018   HGB 14.5 09/12/2018   HCT 45.3 09/12/2018   MCV 85.5 09/12/2018   PLT 204 09/12/2018   Lab Results  Component Value Date   IRON 52 09/12/2018   TIBC 368 09/12/2018   IRONPCTSAT 14 (L) 09/12/2018   Lab Results  Component Value Date   FERRITIN 20 (L) 09/12/2018     STUDIES: No results found.  ASSESSMENT: Iron deficiency anemia.  PLAN:    1.  Iron deficiency anemia: Patient's hemoglobin has significantly improved and is now 14.5.  His iron stores and ferritin are only mildly decreased.  He does not require additional Feraheme today.  Return to clinic in 4 months with repeat laboratory work, further evaluation, and continuation of treatment.    2. Family history of colon cancer:  Patient gets routine colonoscopies given his family history of colon cancer, all of which been unrevealing.  His next colonoscopy is due within the next year.  His genetic testing is negative. 3.  Neck pain: Patient reports he is having surgery in the next month.  I spent a total of 20 minutes face-to-face with the patient of which greater than 50% of the visit was spent in counseling and coordination of care as detailed above.   Patient expressed understanding and was in agreement with this plan. He also understands that He can call clinic at any time with any questions, concerns, or complaints.    Lloyd Huger, MD   09/14/2018 6:13 AM

## 2018-09-12 ENCOUNTER — Other Ambulatory Visit: Payer: Self-pay

## 2018-09-12 ENCOUNTER — Inpatient Hospital Stay (HOSPITAL_BASED_OUTPATIENT_CLINIC_OR_DEPARTMENT_OTHER): Payer: BC Managed Care – PPO | Admitting: Oncology

## 2018-09-12 ENCOUNTER — Inpatient Hospital Stay: Payer: BC Managed Care – PPO | Attending: Oncology

## 2018-09-12 ENCOUNTER — Encounter: Payer: Self-pay | Admitting: Oncology

## 2018-09-12 ENCOUNTER — Inpatient Hospital Stay: Payer: BC Managed Care – PPO

## 2018-09-12 VITALS — BP 118/68 | HR 85 | Temp 97.2°F | Ht 72.0 in | Wt 217.0 lb

## 2018-09-12 DIAGNOSIS — E785 Hyperlipidemia, unspecified: Secondary | ICD-10-CM | POA: Insufficient documentation

## 2018-09-12 DIAGNOSIS — I10 Essential (primary) hypertension: Secondary | ICD-10-CM | POA: Diagnosis not present

## 2018-09-12 DIAGNOSIS — G8929 Other chronic pain: Secondary | ICD-10-CM | POA: Insufficient documentation

## 2018-09-12 DIAGNOSIS — D509 Iron deficiency anemia, unspecified: Secondary | ICD-10-CM | POA: Insufficient documentation

## 2018-09-12 DIAGNOSIS — Z791 Long term (current) use of non-steroidal anti-inflammatories (NSAID): Secondary | ICD-10-CM | POA: Insufficient documentation

## 2018-09-12 DIAGNOSIS — Z7951 Long term (current) use of inhaled steroids: Secondary | ICD-10-CM | POA: Insufficient documentation

## 2018-09-12 DIAGNOSIS — I252 Old myocardial infarction: Secondary | ICD-10-CM | POA: Insufficient documentation

## 2018-09-12 DIAGNOSIS — Z800 Family history of malignant neoplasm of digestive organs: Secondary | ICD-10-CM

## 2018-09-12 DIAGNOSIS — M542 Cervicalgia: Secondary | ICD-10-CM

## 2018-09-12 DIAGNOSIS — Z87891 Personal history of nicotine dependence: Secondary | ICD-10-CM | POA: Insufficient documentation

## 2018-09-12 DIAGNOSIS — Z8249 Family history of ischemic heart disease and other diseases of the circulatory system: Secondary | ICD-10-CM | POA: Insufficient documentation

## 2018-09-12 DIAGNOSIS — Z79899 Other long term (current) drug therapy: Secondary | ICD-10-CM | POA: Diagnosis not present

## 2018-09-12 DIAGNOSIS — K219 Gastro-esophageal reflux disease without esophagitis: Secondary | ICD-10-CM

## 2018-09-12 LAB — CBC WITH DIFFERENTIAL/PLATELET
Abs Immature Granulocytes: 0.02 10*3/uL (ref 0.00–0.07)
Basophils Absolute: 0.1 10*3/uL (ref 0.0–0.1)
Basophils Relative: 1 %
Eosinophils Absolute: 0.2 10*3/uL (ref 0.0–0.5)
Eosinophils Relative: 3 %
HCT: 45.3 % (ref 39.0–52.0)
Hemoglobin: 14.5 g/dL (ref 13.0–17.0)
Immature Granulocytes: 0 %
Lymphocytes Relative: 23 %
Lymphs Abs: 1.7 10*3/uL (ref 0.7–4.0)
MCH: 27.4 pg (ref 26.0–34.0)
MCHC: 32 g/dL (ref 30.0–36.0)
MCV: 85.5 fL (ref 80.0–100.0)
Monocytes Absolute: 0.7 10*3/uL (ref 0.1–1.0)
Monocytes Relative: 10 %
Neutro Abs: 4.6 10*3/uL (ref 1.7–7.7)
Neutrophils Relative %: 63 %
Platelets: 204 10*3/uL (ref 150–400)
RBC: 5.3 MIL/uL (ref 4.22–5.81)
RDW: 14 % (ref 11.5–15.5)
WBC: 7.3 10*3/uL (ref 4.0–10.5)
nRBC: 0 % (ref 0.0–0.2)

## 2018-09-12 LAB — IRON AND TIBC
Iron: 52 ug/dL (ref 45–182)
Saturation Ratios: 14 % — ABNORMAL LOW (ref 17.9–39.5)
TIBC: 368 ug/dL (ref 250–450)
UIBC: 316 ug/dL

## 2018-09-12 LAB — FERRITIN: Ferritin: 20 ng/mL — ABNORMAL LOW (ref 24–336)

## 2018-09-12 NOTE — Progress Notes (Signed)
Patient stated that he continues to have rectal blood loss. Patient stated that he will have surgery next Tuesday on his cervical. Then he would have a colonoscopy.

## 2018-09-13 DIAGNOSIS — M542 Cervicalgia: Secondary | ICD-10-CM | POA: Diagnosis not present

## 2018-09-17 DIAGNOSIS — Z1159 Encounter for screening for other viral diseases: Secondary | ICD-10-CM | POA: Diagnosis not present

## 2018-09-19 DIAGNOSIS — I2510 Atherosclerotic heart disease of native coronary artery without angina pectoris: Secondary | ICD-10-CM | POA: Diagnosis not present

## 2018-09-19 DIAGNOSIS — I1310 Hypertensive heart and chronic kidney disease without heart failure, with stage 1 through stage 4 chronic kidney disease, or unspecified chronic kidney disease: Secondary | ICD-10-CM | POA: Diagnosis not present

## 2018-09-19 DIAGNOSIS — M4852XA Collapsed vertebra, not elsewhere classified, cervical region, initial encounter for fracture: Secondary | ICD-10-CM | POA: Diagnosis not present

## 2018-09-19 DIAGNOSIS — M50222 Other cervical disc displacement at C5-C6 level: Secondary | ICD-10-CM | POA: Diagnosis not present

## 2018-09-19 DIAGNOSIS — F329 Major depressive disorder, single episode, unspecified: Secondary | ICD-10-CM | POA: Diagnosis not present

## 2018-09-19 DIAGNOSIS — N189 Chronic kidney disease, unspecified: Secondary | ICD-10-CM | POA: Diagnosis not present

## 2018-09-19 DIAGNOSIS — K219 Gastro-esophageal reflux disease without esophagitis: Secondary | ICD-10-CM | POA: Diagnosis not present

## 2018-09-19 DIAGNOSIS — M5010 Cervical disc disorder with radiculopathy, unspecified cervical region: Secondary | ICD-10-CM | POA: Diagnosis not present

## 2018-09-19 DIAGNOSIS — Z791 Long term (current) use of non-steroidal anti-inflammatories (NSAID): Secondary | ICD-10-CM | POA: Diagnosis not present

## 2018-09-19 DIAGNOSIS — M50322 Other cervical disc degeneration at C5-C6 level: Secondary | ICD-10-CM | POA: Diagnosis not present

## 2018-09-19 DIAGNOSIS — E063 Autoimmune thyroiditis: Secondary | ICD-10-CM | POA: Diagnosis not present

## 2018-09-19 DIAGNOSIS — I252 Old myocardial infarction: Secondary | ICD-10-CM | POA: Diagnosis not present

## 2018-09-19 DIAGNOSIS — J449 Chronic obstructive pulmonary disease, unspecified: Secondary | ICD-10-CM | POA: Diagnosis not present

## 2018-09-19 DIAGNOSIS — D631 Anemia in chronic kidney disease: Secondary | ICD-10-CM | POA: Diagnosis not present

## 2018-09-19 DIAGNOSIS — M50223 Other cervical disc displacement at C6-C7 level: Secondary | ICD-10-CM | POA: Diagnosis not present

## 2018-09-19 DIAGNOSIS — F17210 Nicotine dependence, cigarettes, uncomplicated: Secondary | ICD-10-CM | POA: Diagnosis not present

## 2018-09-19 DIAGNOSIS — Z79899 Other long term (current) drug therapy: Secondary | ICD-10-CM | POA: Diagnosis not present

## 2018-09-19 DIAGNOSIS — Z7951 Long term (current) use of inhaled steroids: Secondary | ICD-10-CM | POA: Diagnosis not present

## 2018-09-19 DIAGNOSIS — M50323 Other cervical disc degeneration at C6-C7 level: Secondary | ICD-10-CM | POA: Diagnosis not present

## 2018-09-20 DIAGNOSIS — I1310 Hypertensive heart and chronic kidney disease without heart failure, with stage 1 through stage 4 chronic kidney disease, or unspecified chronic kidney disease: Secondary | ICD-10-CM | POA: Diagnosis not present

## 2018-09-20 DIAGNOSIS — F329 Major depressive disorder, single episode, unspecified: Secondary | ICD-10-CM | POA: Diagnosis not present

## 2018-09-20 DIAGNOSIS — F17210 Nicotine dependence, cigarettes, uncomplicated: Secondary | ICD-10-CM | POA: Diagnosis not present

## 2018-09-20 DIAGNOSIS — I2510 Atherosclerotic heart disease of native coronary artery without angina pectoris: Secondary | ICD-10-CM | POA: Diagnosis not present

## 2018-09-20 DIAGNOSIS — I252 Old myocardial infarction: Secondary | ICD-10-CM | POA: Diagnosis not present

## 2018-09-20 DIAGNOSIS — J449 Chronic obstructive pulmonary disease, unspecified: Secondary | ICD-10-CM | POA: Diagnosis not present

## 2018-09-20 DIAGNOSIS — Z79899 Other long term (current) drug therapy: Secondary | ICD-10-CM | POA: Diagnosis not present

## 2018-09-20 DIAGNOSIS — Z791 Long term (current) use of non-steroidal anti-inflammatories (NSAID): Secondary | ICD-10-CM | POA: Diagnosis not present

## 2018-09-20 DIAGNOSIS — E063 Autoimmune thyroiditis: Secondary | ICD-10-CM | POA: Diagnosis not present

## 2018-09-20 DIAGNOSIS — N189 Chronic kidney disease, unspecified: Secondary | ICD-10-CM | POA: Diagnosis not present

## 2018-09-20 DIAGNOSIS — Z7951 Long term (current) use of inhaled steroids: Secondary | ICD-10-CM | POA: Diagnosis not present

## 2018-09-20 DIAGNOSIS — D631 Anemia in chronic kidney disease: Secondary | ICD-10-CM | POA: Diagnosis not present

## 2018-09-20 DIAGNOSIS — K219 Gastro-esophageal reflux disease without esophagitis: Secondary | ICD-10-CM | POA: Diagnosis not present

## 2018-09-20 DIAGNOSIS — M50222 Other cervical disc displacement at C5-C6 level: Secondary | ICD-10-CM | POA: Diagnosis not present

## 2018-09-27 ENCOUNTER — Other Ambulatory Visit: Payer: Self-pay | Admitting: Internal Medicine

## 2018-09-27 NOTE — Telephone Encounter (Signed)
Last 7/20 and next 10/09/18

## 2018-10-09 ENCOUNTER — Encounter: Payer: Self-pay | Admitting: Adult Health

## 2018-10-09 ENCOUNTER — Other Ambulatory Visit: Payer: Self-pay

## 2018-10-09 ENCOUNTER — Other Ambulatory Visit: Payer: Self-pay | Admitting: Adult Health

## 2018-10-09 ENCOUNTER — Ambulatory Visit: Payer: BC Managed Care – PPO | Admitting: Adult Health

## 2018-10-09 VITALS — BP 138/72 | HR 77 | Resp 16 | Ht 72.0 in | Wt 217.0 lb

## 2018-10-09 DIAGNOSIS — G47429 Narcolepsy in conditions classified elsewhere without cataplexy: Secondary | ICD-10-CM

## 2018-10-09 DIAGNOSIS — F411 Generalized anxiety disorder: Secondary | ICD-10-CM

## 2018-10-09 DIAGNOSIS — R300 Dysuria: Secondary | ICD-10-CM

## 2018-10-09 DIAGNOSIS — M50022 Cervical disc disorder at C5-C6 level with myelopathy: Secondary | ICD-10-CM

## 2018-10-09 DIAGNOSIS — E785 Hyperlipidemia, unspecified: Secondary | ICD-10-CM

## 2018-10-09 DIAGNOSIS — F410 Panic disorder [episodic paroxysmal anxiety] without agoraphobia: Secondary | ICD-10-CM

## 2018-10-09 DIAGNOSIS — Z0001 Encounter for general adult medical examination with abnormal findings: Secondary | ICD-10-CM

## 2018-10-09 DIAGNOSIS — I10 Essential (primary) hypertension: Secondary | ICD-10-CM

## 2018-10-09 DIAGNOSIS — Z79899 Other long term (current) drug therapy: Secondary | ICD-10-CM

## 2018-10-09 DIAGNOSIS — E039 Hypothyroidism, unspecified: Secondary | ICD-10-CM

## 2018-10-09 DIAGNOSIS — E291 Testicular hypofunction: Secondary | ICD-10-CM

## 2018-10-09 LAB — POCT URINE DRUG SCREEN
POC Amphetamine UR: POSITIVE — AB
POC BENZODIAZEPINES UR: POSITIVE — AB
POC Barbiturate UR: NOT DETECTED
POC Cocaine UR: NOT DETECTED
POC Ecstasy UR: NOT DETECTED
POC Marijuana UR: NOT DETECTED
POC Methadone UR: NOT DETECTED
POC Methamphetamine UR: NOT DETECTED
POC Opiate Ur: POSITIVE — AB
POC Oxycodone UR: POSITIVE — AB
POC PHENCYCLIDINE UR: NOT DETECTED
POC TRICYCLICS UR: NOT DETECTED

## 2018-10-09 NOTE — Progress Notes (Signed)
[facility] [place], [place] [address]  Internal MEDICINE  Office Visit Note  Patient Name: Randy Mclean  [id number]  [id number]  Date of Service: 10/09/2018  Chief Complaint  Patient presents with  . Medical Management of Chronic Issues  . Annual Exam  . Hyperlipidemia  . Hypertension  . ADD     HPI Pt is here for routine health maintenance examination.  Pt is well appearing 58 yo male.  He has recently had surgery on his cervical neck.  Overall he is doing well, denies any new complaints. He denies any current issues. He is still treated for htn, hld, narcolepsy, hypogonadism and anxiety.   Current Medication: Outpatient Encounter Medications as of 10/09/2018  Medication Sig  . albuterol (PROVENTIL HFA;VENTOLIN HFA) 108 (90 Base) MCG/ACT inhaler Inhale 2 puffs into the lungs every 4 (four) hours as needed for wheezing or shortness of breath.  . alfuzosin (UROXATRAL) 10 MG 24 hr tablet Take 1 tablet (10 mg total) by mouth daily.  Marland Kitchen ALPRAZolam (XANAX) 0.5 MG tablet Take 1 tablet (0.5 mg total) by mouth 3 (three) times daily as needed. for anxiety  . amphetamine-dextroamphetamine (ADDERALL) 20 MG tablet Take 1 tablet (20 mg total) by mouth 3 (three) times daily as needed.  Marland Kitchen atorvastatin (LIPITOR) 10 MG tablet Take 1 tablet (10 mg total) by mouth daily.  . Azelastine HCl 137 MCG/SPRAY SOLN Place 1 spray into the nose 2 (two) times a day.  Marland Kitchen buPROPion (WELLBUTRIN XL) 300 MG 24 hr tablet Take 1 tablet (300 mg total) by mouth daily.  . chlorpheniramine-HYDROcodone (TUSSIONEX PENNKINETIC ER) 10-8 MG/5ML SUER Take 5 mLs by mouth every 12 (twelve) hours as needed for cough. (Patient not taking: Reported on 09/12/2018)  . citalopram (CELEXA) 20 MG tablet TAKE 1 TABLET BY MOUTH DAILY  . clotrimazole-betamethasone (LOTRISONE) cream Apply 1 application topically 2 (two) times daily.  Marland Kitchen esomeprazole (NEXIUM) 40 MG capsule TK ONE C PO  BID UTD  .  l-methylfolate-B6-B12 (METANX) 3-35-2 MG TABS Take 1 tablet by mouth 2 (two) times daily.  . Levothyroxine Sodium (TIROSINT) 200 MCG CAPS Take 200 mcg by mouth daily.  Marland Kitchen losartan (COZAAR) 50 MG tablet Take 1 tablet (50 mg total) by mouth daily.  . meloxicam (MOBIC) 7.5 MG tablet TAKE 1 TABLET BY MOUTH 2 TIMES DAILY  . metoprolol succinate (TOPROL-XL) 25 MG 24 hr tablet Take 1 tablet (25 mg total) by mouth daily.  . mometasone-formoterol (DULERA) 100-5 MCG/ACT AERO Inhale 2 puffs into the lungs every 12 (twelve) hours.  . Multiple Vitamin (MULTI-VITAMINS) TABS Take by mouth.  Gean Birchwood ER 200 MG TB12 200 mg bid  . oxyCODONE-acetaminophen (PERCOCET) 7.5-325 MG per tablet Take 1 tablet by mouth every 4 (four) hours as needed for pain.   . pregabalin (LYRICA) 50 MG capsule Take 100 mg by mouth 3 (three) times daily.   . RYBELSUS 7 MG TABS TAKE 1 TABLET BY MOUTH DAILY (Patient not taking: Reported on 09/12/2018)  . senna (SENOKOT) 8.6 MG tablet Take by mouth.  . SYRINGE-NEEDLE, DISP, 3 ML (MONOJECT 3CC SYR 22GX1") 22G X 1" 3 ML MISC Use as directed.  . testosterone cypionate (DEPOTESTOSTERONE CYPIONATE) 200 MG/ML injection INJECT 0.75 MLS (75MG  TOTAL) INTO THE MUSCLE EVERY 7 DAYS AS DIRECTED  . TIROSINT 150 MCG CAPS TAKE ONE CAPSULE BY MOUTH EVERY MORNING FOR HYPOTHYROIDISM ONE-HALF HOUR BEFORE A MEAL (Patient not taking: Reported on 09/12/2018)   No facility-administered encounter medications on  file as of 10/09/2018.     Surgical History: Past Surgical History:  Procedure Laterality Date  . ANTERIOR CERVICAL DISCECTOMY    . APPENDECTOMY    . BACK SURGERY    . COLONOSCOPY  August 2015  . elbow surgery    . HEMORRHOID SURGERY    . LAPAROSCOPIC APPENDECTOMY N/A 12/04/2017   Procedure: APPENDECTOMY LAPAROSCOPIC;  Surgeon: Vickie Epley, MD;  Location: [facility] ORS;  Service: General;  Laterality: N/A;  . LIPOMA EXCISION Right    leg  . 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 SPINAL  CORD STIMULATOR IMPLANT    . UPPER GI ENDOSCOPY  August 2015    Medical History: Past Medical History:  Diagnosis Date  . Acute appendicitis 12/04/2017  . Allergy    takes allergy shots  . Anemia   . Arthritis   . Asthma   . Barrett esophagus   . Bronchitis   . Chronic pain   . Colon polyp   . Depression   . GERD (gastroesophageal reflux disease)   . Hemorrhoid   . Hyperlipidemia   . Hypertension   . Hypothyroid   . IBS (irritable bowel syndrome)   . Kidney stone   . MI (myocardial infarction) ([place])    between 2002 and 2006  . Migraine   . Pneumonia   . Seizures ([place])   . Sinus problem   . Spinal cord stimulator status     Family History: Family History  Problem Relation Age of Onset  . Hypertension Mother   . Lung disease Mother   . Heart attack Father   . Diabetes Father       Review of Systems   Vital Signs: BP 138/72   Pulse 77   Resp 16   Ht 6' (1.829 m)   Wt 217 lb (98.4 kg)   SpO2 99%   BMI 29.43 kg/m    Physical Exam   LABS: Recent Results (from the past 2160 hour(s))  APTT     Status: None   Collection Time: 08/04/18  8:08 AM  Result Value Ref Range   aPTT 34 24 - 36 seconds    Comment: Performed at [facility], [address] [place], [place] [address]  Protime-INR     Status: None   Collection Time: 08/04/18  8:08 AM  Result Value Ref Range   Prothrombin Time 13.1 11.4 - 15.2 seconds   INR 1.0 0.8 - 1.2    Comment: (NOTE) INR goal varies based on device and disease states. Performed at [facility], [address] [place], [place] [address]   CBC     Status: Abnormal   Collection Time: 08/04/18  8:08 AM  Result Value Ref Range   WBC 6.9 4.0 - 10.5 K/uL   RBC 5.33 4.22 - 5.81 MIL/uL   Hemoglobin 14.0 13.0 - 17.0 g/dL   HCT 44.7 39.0 - 52.0 %   MCV 83.9 80.0 - 100.0 fL   MCH 26.3 26.0 - 34.0 pg   MCHC 31.3 30.0 - 36.0 g/dL   RDW 17.4 (H) 11.5 - 15.5 %   Platelets 206 150 - 400 K/uL   nRBC 0.0  0.0 - 0.2 %    Comment: Performed at [facility], [address] [place], [place] [address]  POCT Urine Drug Screen     Status: Abnormal   Collection Time: 08/30/18 10:16 AM  Result Value Ref Range   POC METHAMPHETAMINE UR None  Detected None Detected   POC Opiate Ur None Detected None Detected   POC Barbiturate UR None Detected None Detected   POC Amphetamine UR Positive (A) None Detected   POC Oxycodone UR Positive (A) None Detected   POC Cocaine UR None Detected None Detected   POC Ecstasy UR None Detected None Detected   POC TRICYCLICS UR None Detected None Detected   POC PHENCYCLIDINE UR None Detected None Detected   POC MARIJUANA UR None Detected None Detected   POC METHADONE UR None Detected None Detected   POC BENZODIAZEPINES UR None Detected None Detected   URINE TEMPERATURE 96.0 90.0 - 100.0 Degrees F   POC DRUG SCREEN OXIDANTS URINE     POC SPECIFIC GRAVITY URINE     POC PH URINE     Methylenedioxyamphetamine    Ferritin     Status: Abnormal   Collection Time: 09/12/18 10:25 AM  Result Value Ref Range   Ferritin 20 (L) 24 - 336 ng/mL    Comment: Performed at [facility], [address] [place], [place] [address]  CBC with Differential/Platelet     Status: None   Collection Time: 09/12/18 10:25 AM  Result Value Ref Range   WBC 7.3 4.0 - 10.5 K/uL   RBC 5.30 4.22 - 5.81 MIL/uL   Hemoglobin 14.5 13.0 - 17.0 g/dL   HCT 45.3 39.0 - 52.0 %   MCV 85.5 80.0 - 100.0 fL   MCH 27.4 26.0 - 34.0 pg   MCHC 32.0 30.0 - 36.0 g/dL   RDW 14.0 11.5 - 15.5 %   Platelets 204 150 - 400 K/uL   nRBC 0.0 0.0 - 0.2 %   Neutrophils Relative % 63 %   Neutro Abs 4.6 1.7 - 7.7 K/uL   Lymphocytes Relative 23 %   Lymphs Abs 1.7 0.7 - 4.0 K/uL   Monocytes Relative 10 %   Monocytes Absolute 0.7 0.1 - 1.0 K/uL   Eosinophils Relative 3 %   Eosinophils Absolute 0.2 0.0 - 0.5 K/uL   Basophils Relative 1 %   Basophils Absolute 0.1 0.0 - 0.1 K/uL   Immature Granulocytes 0  %   Abs Immature Granulocytes 0.02 0.00 - 0.07 K/uL    Comment: Performed at [facility], [place], [place] [address]  Iron and TIBC     Status: Abnormal   Collection Time: 09/12/18 10:25 AM  Result Value Ref Range   Iron 52 45 - 182 ug/dL   TIBC 368 250 - 450 ug/dL   Saturation Ratios 14 (L) 17.9 - 39.5 %   UIBC 316 ug/dL    Comment: Performed at [facility], [address], [place], [place] [address]    Assessment/Plan: 1. Encounter for long-term current use of high risk medication Up to date on PHM.  - POCT Urine Drug Screen - CBC with Differential/Platelet - Lipid Panel With LDL/HDL Ratio - TSH - T4, free - Comprehensive metabolic panel - Testosterone,Free and Total - Prolactin - PSA  2. Cervical disc disorder at C5-C6 level with myelopathy Recent neck surgery.   3. Generalized anxiety disorder with panic attacks Stable continue current medications.   4. Acquired hypothyroidism Will check TSH and Free T4  5. Narcolepsy due to underlying condition without cataplexy Continue to use adderall as prescribed.  6. History of long-term treatment with high-risk medication As expected.  7. Essential hypertension Stable, continue present management.   8. Hyperlipidemia, unspecified hyperlipidemia type Check lipid panel, and  review results when available.  9. Hypogonadism, testicular Check PSA, Prolactin, and testosterone  10. Dysuria - UA/M w/rflx Culture, Routine  General Counseling: Bayani verbalizes understanding of the findings of todays visit and agrees with plan of treatment. I have discussed any further diagnostic evaluation that may be needed or ordered today. We also reviewed his medications today. he has been encouraged to call the office with any questions or concerns that should arise related to todays visit.   Orders Placed This Encounter  Procedures  . UA/M w/rflx Culture, Routine    No orders of the defined types  were placed in this encounter.   Time spent: 35 Minutes   This patient was seen by Orson Gear AGNP-C in Collaboration with Dr Lavera Guise as a part of collaborative care agreement    Kendell Bane AGNP-C Internal Medicine

## 2018-10-10 LAB — MICROSCOPIC EXAMINATION
Bacteria, UA: NONE SEEN
Epithelial Cells (non renal): NONE SEEN /hpf (ref 0–10)

## 2018-10-10 LAB — UA/M W/RFLX CULTURE, ROUTINE
Bilirubin, UA: NEGATIVE
Glucose, UA: NEGATIVE
Ketones, UA: NEGATIVE
Leukocytes,UA: NEGATIVE
Nitrite, UA: NEGATIVE
Protein,UA: NEGATIVE
RBC, UA: NEGATIVE
Specific Gravity, UA: 1.02 (ref 1.005–1.030)
Urobilinogen, Ur: 0.2 mg/dL (ref 0.2–1.0)
pH, UA: 5.5 (ref 5.0–7.5)

## 2018-10-11 DIAGNOSIS — Z125 Encounter for screening for malignant neoplasm of prostate: Secondary | ICD-10-CM | POA: Diagnosis not present

## 2018-10-11 DIAGNOSIS — Z79899 Other long term (current) drug therapy: Secondary | ICD-10-CM | POA: Diagnosis not present

## 2018-10-13 LAB — COMPREHENSIVE METABOLIC PANEL
ALT: 24 IU/L (ref 0–44)
AST: 23 IU/L (ref 0–40)
Albumin/Globulin Ratio: 2 (ref 1.2–2.2)
Albumin: 4.2 g/dL (ref 3.8–4.9)
Alkaline Phosphatase: 96 IU/L (ref 39–117)
BUN/Creatinine Ratio: 7 — ABNORMAL LOW (ref 9–20)
BUN: 8 mg/dL (ref 6–24)
Bilirubin Total: 0.3 mg/dL (ref 0.0–1.2)
CO2: 26 mmol/L (ref 20–29)
Calcium: 8.5 mg/dL — ABNORMAL LOW (ref 8.7–10.2)
Chloride: 103 mmol/L (ref 96–106)
Creatinine, Ser: 1.16 mg/dL (ref 0.76–1.27)
GFR calc Af Amer: 81 mL/min/{1.73_m2} (ref >59)
GFR calc non Af Amer: 70 mL/min/{1.73_m2} (ref >59)
Globulin, Total: 2.1 g/dL (ref 1.5–4.5)
Glucose: 99 mg/dL (ref 65–99)
Potassium: 4.8 mmol/L (ref 3.5–5.2)
Sodium: 143 mmol/L (ref 134–144)
Total Protein: 6.3 g/dL (ref 6.0–8.5)

## 2018-10-13 LAB — CBC WITH DIFFERENTIAL/PLATELET
Basophils Absolute: 0.1 10*3/uL (ref 0.0–0.2)
Basos: 2 %
EOS (ABSOLUTE): 0.2 10*3/uL (ref 0.0–0.4)
Eos: 3 %
Hematocrit: 43.5 % (ref 37.5–51.0)
Hemoglobin: 14.4 g/dL (ref 13.0–17.7)
Immature Grans (Abs): 0 10*3/uL (ref 0.0–0.1)
Immature Granulocytes: 0 %
Lymphocytes Absolute: 1.8 10*3/uL (ref 0.7–3.1)
Lymphs: 26 %
MCH: 28.3 pg (ref 26.6–33.0)
MCHC: 33.1 g/dL (ref 31.5–35.7)
MCV: 86 fL (ref 79–97)
Monocytes Absolute: 0.7 10*3/uL (ref 0.1–0.9)
Monocytes: 10 %
Neutrophils Absolute: 4 10*3/uL (ref 1.4–7.0)
Neutrophils: 59 %
Platelets: 278 10*3/uL (ref 150–450)
RBC: 5.08 x10E6/uL (ref 4.14–5.80)
RDW: 13.2 % (ref 11.6–15.4)
WBC: 6.8 10*3/uL (ref 3.4–10.8)

## 2018-10-13 LAB — TESTOSTERONE,FREE AND TOTAL
Testosterone, Free: 7 pg/mL — ABNORMAL LOW (ref 7.2–24.0)
Testosterone: 514 ng/dL (ref 264–916)

## 2018-10-13 LAB — PROLACTIN: Prolactin: 9.3 ng/mL (ref 4.0–15.2)

## 2018-10-13 LAB — LIPID PANEL WITH LDL/HDL RATIO
Cholesterol, Total: 159 mg/dL (ref 100–199)
HDL: 46 mg/dL (ref >39)
LDL Calculated: 90 mg/dL (ref 0–99)
LDl/HDL Ratio: 2 ratio (ref 0.0–3.6)
Triglycerides: 117 mg/dL (ref 0–149)
VLDL Cholesterol Cal: 23 mg/dL (ref 5–40)

## 2018-10-13 LAB — T4, FREE: Free T4: 1.17 ng/dL (ref 0.82–1.77)

## 2018-10-13 LAB — PSA: Prostate Specific Ag, Serum: 1.5 ng/mL (ref 0.0–4.0)

## 2018-10-13 LAB — TSH: TSH: 0.078 u[IU]/mL — ABNORMAL LOW (ref 0.450–4.500)

## 2018-10-19 ENCOUNTER — Ambulatory Visit: Payer: BC Managed Care – PPO | Admitting: Adult Health

## 2018-10-19 ENCOUNTER — Encounter: Payer: Self-pay | Admitting: Adult Health

## 2018-10-19 VITALS — BP 126/78 | HR 90 | Resp 16 | Ht 72.0 in | Wt 217.0 lb

## 2018-10-19 DIAGNOSIS — I10 Essential (primary) hypertension: Secondary | ICD-10-CM | POA: Diagnosis not present

## 2018-10-19 DIAGNOSIS — G47429 Narcolepsy in conditions classified elsewhere without cataplexy: Secondary | ICD-10-CM

## 2018-10-19 DIAGNOSIS — E291 Testicular hypofunction: Secondary | ICD-10-CM | POA: Diagnosis not present

## 2018-10-19 DIAGNOSIS — R41840 Attention and concentration deficit: Secondary | ICD-10-CM

## 2018-10-19 DIAGNOSIS — E039 Hypothyroidism, unspecified: Secondary | ICD-10-CM

## 2018-10-19 DIAGNOSIS — E785 Hyperlipidemia, unspecified: Secondary | ICD-10-CM | POA: Diagnosis not present

## 2018-10-19 MED ORDER — AMPHETAMINE-DEXTROAMPHETAMINE 20 MG PO TABS: 20.0000 mg | ORAL_TABLET | Freq: Three times a day (TID) | ORAL | 0 refills | Status: DC | PRN

## 2018-10-19 MED ORDER — AMPHETAMINE-DEXTROAMPHETAMINE 20 MG PO TABS: 20.0000 mg | ORAL_TABLET | Freq: Every day | ORAL | 0 refills | Status: DC

## 2018-10-19 NOTE — Progress Notes (Signed)
[facility] [place], [place] [address]  Internal MEDICINE  Office Visit Note  Patient Name: Randy Mclean  [id number]  [id number]  Date of Service: 10/19/2018  Chief Complaint  Patient presents with  . Medical Management of Chronic Issues    1 week follow up labs     HPI  Pt is here today to review labs. Overall his labs are ok. No issues at this time.  We discussed his tyroid numbers and he will continue his synthroid at this time.   Current Medication: Outpatient Encounter Medications as of 10/19/2018  Medication Sig  . albuterol (PROVENTIL HFA;VENTOLIN HFA) 108 (90 Base) MCG/ACT inhaler Inhale 2 puffs into the lungs every 4 (four) hours as needed for wheezing or shortness of breath.  . alfuzosin (UROXATRAL) 10 MG 24 hr tablet Take 1 tablet (10 mg total) by mouth daily.  Marland Kitchen ALPRAZolam (XANAX) 0.5 MG tablet Take 1 tablet (0.5 mg total) by mouth 3 (three) times daily as needed. for anxiety  . amphetamine-dextroamphetamine (ADDERALL) 20 MG tablet Take 1 tablet (20 mg total) by mouth 3 (three) times daily as needed.  Marland Kitchen atorvastatin (LIPITOR) 10 MG tablet Take 1 tablet (10 mg total) by mouth daily.  . Azelastine HCl 137 MCG/SPRAY SOLN Place 1 spray into the nose 2 (two) times a day.  Marland Kitchen buPROPion (WELLBUTRIN XL) 300 MG 24 hr tablet TAKE ONE TABLET BY MOUTH DAILY. GENERIC EQUIVALENT FOR WELLBUTRIN XL. DUE FOR OFFICE VISIT.  Marland Kitchen citalopram (CELEXA) 20 MG tablet TAKE 1 TABLET BY MOUTH DAILY  . clotrimazole-betamethasone (LOTRISONE) cream Apply 1 application topically 2 (two) times daily.  Marland Kitchen esomeprazole (NEXIUM) 40 MG capsule TK ONE C PO  BID UTD  . l-methylfolate-B6-B12 (METANX) 3-35-2 MG TABS Take 1 tablet by mouth 2 (two) times daily.  . Levothyroxine Sodium (TIROSINT) 200 MCG CAPS Take 200 mcg by mouth daily.  Marland Kitchen losartan (COZAAR) 50 MG tablet Take 1 tablet (50 mg total) by mouth daily.  . meloxicam (MOBIC) 7.5 MG tablet TAKE 1 TABLET BY MOUTH 2 TIMES  DAILY  . metoprolol succinate (TOPROL-XL) 25 MG 24 hr tablet Take 1 tablet (25 mg total) by mouth daily.  . mometasone-formoterol (DULERA) 100-5 MCG/ACT AERO Inhale 2 puffs into the lungs every 12 (twelve) hours.  . Multiple Vitamin (MULTI-VITAMINS) TABS Take by mouth.  Gean Birchwood ER 200 MG TB12 200 mg bid  . oxyCODONE-acetaminophen (PERCOCET) 7.5-325 MG per tablet Take 1 tablet by mouth every 4 (four) hours as needed for pain.   . pregabalin (LYRICA) 50 MG capsule Take 100 mg by mouth 3 (three) times daily.   Marland Kitchen senna (SENOKOT) 8.6 MG tablet Take by mouth.  . SYRINGE-NEEDLE, DISP, 3 ML (MONOJECT 3CC SYR 22GX1") 22G X 1" 3 ML MISC Use as directed.  . testosterone cypionate (DEPOTESTOSTERONE CYPIONATE) 200 MG/ML injection INJECT 0.75 MLS (75MG  TOTAL) INTO THE MUSCLE EVERY 7 DAYS AS DIRECTED   No facility-administered encounter medications on file as of 10/19/2018.     Surgical History: Past Surgical History:  Procedure Laterality Date  . ANTERIOR CERVICAL DISCECTOMY    . APPENDECTOMY    . BACK SURGERY    . COLONOSCOPY  August 2015  . elbow surgery    . HEMORRHOID SURGERY    . LAPAROSCOPIC APPENDECTOMY N/A 12/04/2017   Procedure: APPENDECTOMY LAPAROSCOPIC;  Surgeon: Vickie Epley, MD;  Location: [facility] ORS;  Service: General;  Laterality: N/A;  . LIPOMA EXCISION Right    leg  .  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 SPINAL CORD STIMULATOR IMPLANT    . UPPER GI ENDOSCOPY  August 2015    Medical History: Past Medical History:  Diagnosis Date  . Acute appendicitis 12/04/2017  . Allergy    takes allergy shots  . Anemia   . Arthritis   . Asthma   . Barrett esophagus   . Bronchitis   . Chronic pain   . Colon polyp   . Depression   . GERD (gastroesophageal reflux disease)   . Hemorrhoid   . Hyperlipidemia   . Hypertension   . Hypothyroid   . IBS (irritable bowel syndrome)   . Kidney stone   . MI (myocardial infarction) ([place])    between 2002 and 2006  . Migraine    . Pneumonia   . Seizures ([place])   . Sinus problem   . Spinal cord stimulator status     Family History: Family History  Problem Relation Age of Onset  . Hypertension Mother   . Lung disease Mother   . Heart attack Father   . Diabetes Father     Social History   Socioeconomic History  . Marital status: Married    Spouse name: Not on file  . Number of children: Not on file  . Years of education: Not on file  . Highest education level: Not on file  Occupational History  . Not on file  Social Needs  . Financial resource strain: Not on file  . Food insecurity    Worry: Not on file    Inability: Not on file  . Transportation needs    Medical: Not on file    Non-medical: Not on file  Tobacco Use  . Smoking status: Former Smoker    Packs/day: 1.00    Years: 30.00    Pack years: 30.00    Types: Cigarettes    Quit date: 02/22/2005    Years since quitting: 13.6  . Smokeless tobacco: Never Used  Substance and Sexual Activity  . Alcohol use: Yes    Alcohol/week: 0.0 standard drinks    Comment: occasionally  . Drug use: No    Comment: past  . Sexual activity: Yes  Lifestyle  . Physical activity    Days per week: Not on file    Minutes per session: Not on file  . Stress: Not on file  Relationships  . Social Herbalist on phone: Not on file    Gets together: Not on file    Attends religious service: Not on file    Active member of club or organization: Not on file    Attends meetings of clubs or organizations: Not on file    Relationship status: Not on file  . Intimate partner violence    Fear of current or ex partner: Not on file    Emotionally abused: Not on file    Physically abused: Not on file    Forced sexual activity: Not on file  Other Topics Concern  . Not on file  Social History Narrative  . Not on file      Review of Systems  Constitutional: Negative.  Negative for chills, fatigue and unexpected weight change.  HENT: Negative.  Negative  for congestion, rhinorrhea, sneezing and sore throat.   Eyes: Negative for redness.  Respiratory: Negative.  Negative for cough, chest tightness and shortness of breath.   Cardiovascular: Negative.  Negative for chest pain and palpitations.  Gastrointestinal: Negative.  Negative for abdominal pain, constipation, diarrhea, nausea and vomiting.  Endocrine: Negative.   Genitourinary: Negative.  Negative for dysuria and frequency.  Musculoskeletal: Negative.  Negative for arthralgias, back pain, joint swelling and neck pain.  Skin: Negative.  Negative for rash.  Allergic/Immunologic: Negative.   Neurological: Negative.  Negative for tremors and numbness.  Hematological: Negative for adenopathy. Does not bruise/bleed easily.  Psychiatric/Behavioral: Negative.  Negative for behavioral problems, sleep disturbance and suicidal ideas. The patient is not nervous/anxious.     Vital Signs: BP 126/78   Pulse 90   Resp 16   Ht 6' (1.829 m)   Wt 217 lb (98.4 kg)   SpO2 98%   BMI 29.43 kg/m    Physical Exam Vitals signs and nursing note reviewed.  Constitutional:      General: He is not in acute distress.    Appearance: He is well-developed. He is not diaphoretic.  HENT:     Head: Normocephalic and atraumatic.     Mouth/Throat:     Pharynx: No oropharyngeal exudate.  Eyes:     Pupils: Pupils are equal, round, and reactive to light.  Neck:     Musculoskeletal: Normal range of motion and neck supple.     Thyroid: No thyromegaly.     Vascular: No JVD.     Trachea: No tracheal deviation.  Cardiovascular:     Rate and Rhythm: Normal rate and regular rhythm.     Heart sounds: Normal heart sounds. No murmur. No friction rub. No gallop.   Pulmonary:     Effort: Pulmonary effort is normal. No respiratory distress.     Breath sounds: Normal breath sounds. No wheezing or rales.  Chest:     Chest wall: No tenderness.  Abdominal:     Palpations: Abdomen is soft.     Tenderness: There is no  abdominal tenderness. There is no guarding.  Musculoskeletal: Normal range of motion.  Lymphadenopathy:     Cervical: No cervical adenopathy.  Skin:    General: Skin is warm and dry.  Neurological:     Mental Status: He is alert and oriented to person, place, and time.     Cranial Nerves: No cranial nerve deficit.  Psychiatric:        Behavior: Behavior normal.        Thought Content: Thought content normal.        Judgment: Judgment normal.     Assessment/Plan: 1. Essential hypertension Stable, continue vent management.  2. Hyperlipidemia, unspecified hyperlipidemia type Well-controlled, continue current therapy.  3. Hypogonadism, testicular Prolactin, testosterone, and PSA are within normal limits.  Patient will continue testosterone injections as prescribed.  4. Acquired hypothyroidism Stable, continue present management and Synthroid dose.  General Counseling: Randy Mclean understanding of the findings of todays visit and agrees with plan of treatment. I have discussed any further diagnostic evaluation that may be needed or ordered today. We also reviewed his medications today. he has been encouraged to call the office with any questions or concerns that should arise related to todays visit.    No orders of the defined types were placed in this encounter.   No orders of the defined types were placed in this encounter.   Time spent: 15 Minutes   This patient was seen by Orson Gear AGNP-C in Collaboration with Dr Lavera Guise as a part of collaborative care agreement     Randy Mclean AGNP-C Internal medicine

## 2018-10-26 DIAGNOSIS — G894 Chronic pain syndrome: Secondary | ICD-10-CM | POA: Diagnosis not present

## 2018-10-26 DIAGNOSIS — M4722 Other spondylosis with radiculopathy, cervical region: Secondary | ICD-10-CM | POA: Diagnosis not present

## 2018-10-26 DIAGNOSIS — M961 Postlaminectomy syndrome, not elsewhere classified: Secondary | ICD-10-CM | POA: Diagnosis not present

## 2018-10-26 DIAGNOSIS — Z79891 Long term (current) use of opiate analgesic: Secondary | ICD-10-CM | POA: Diagnosis not present

## 2018-10-27 DIAGNOSIS — M47812 Spondylosis without myelopathy or radiculopathy, cervical region: Secondary | ICD-10-CM | POA: Diagnosis not present

## 2018-10-27 DIAGNOSIS — M5020 Other cervical disc displacement, unspecified cervical region: Secondary | ICD-10-CM | POA: Diagnosis not present

## 2018-11-06 ENCOUNTER — Other Ambulatory Visit: Payer: Self-pay | Admitting: Adult Health

## 2018-11-17 ENCOUNTER — Other Ambulatory Visit: Payer: Self-pay

## 2018-11-17 MED ORDER — LOSARTAN POTASSIUM 50 MG PO TABS: 50.0000 mg | ORAL_TABLET | Freq: Every day | ORAL | 0 refills | Status: DC

## 2018-12-11 ENCOUNTER — Other Ambulatory Visit: Payer: Self-pay | Admitting: Adult Health

## 2018-12-11 DIAGNOSIS — N401 Enlarged prostate with lower urinary tract symptoms: Secondary | ICD-10-CM

## 2018-12-12 ENCOUNTER — Other Ambulatory Visit: Payer: Self-pay | Admitting: Adult Health

## 2018-12-15 ENCOUNTER — Other Ambulatory Visit: Payer: Self-pay | Admitting: Nurse Practitioner

## 2018-12-15 DIAGNOSIS — E291 Testicular hypofunction: Secondary | ICD-10-CM

## 2018-12-15 MED ORDER — TESTOSTERONE CYPIONATE 200 MG/ML IM SOLN: INTRAMUSCULAR | 2 refills | Status: DC

## 2018-12-15 NOTE — Progress Notes (Signed)
Refilled testosterone injections and sent to [facility]

## 2018-12-19 ENCOUNTER — Other Ambulatory Visit: Payer: Self-pay | Admitting: Nurse Practitioner

## 2018-12-19 DIAGNOSIS — E291 Testicular hypofunction: Secondary | ICD-10-CM

## 2018-12-19 MED ORDER — TESTOSTERONE CYPIONATE 200 MG/ML IM SOLN: INTRAMUSCULAR | 2 refills | Status: DC

## 2018-12-19 NOTE — Telephone Encounter (Signed)
Medication went to wrong pharmacy 

## 2019-01-03 ENCOUNTER — Other Ambulatory Visit: Payer: Self-pay

## 2019-01-03 DIAGNOSIS — J4521 Mild intermittent asthma with (acute) exacerbation: Secondary | ICD-10-CM

## 2019-01-03 DIAGNOSIS — IMO0001 Reserved for inherently not codable concepts without codable children: Secondary | ICD-10-CM

## 2019-01-03 MED ORDER — ALBUTEROL SULFATE HFA 108 (90 BASE) MCG/ACT IN AERS: 2.0000 | INHALATION_SPRAY | RESPIRATORY_TRACT | 3 refills | Status: DC | PRN

## 2019-01-06 NOTE — Progress Notes (Deleted)
Randy Mclean  [phone] [phone]) [phone]  ID: KAILLOU CUPPETT Younger OB: 06/28/1960  MR#: [id number]  [id number]  Patient Care Team: Lavera Guise, MD as PCP - General (Internal Medicine) Bary Castilla Forest Gleason, MD (General Surgery) Lavera Guise, MD (Internal Medicine)  CHIEF COMPLAINT: Iron deficiency anemia.  INTERVAL HISTORY: Patient returns to clinic today for repeat laboratory work, further evaluation, and consideration of IV Feraheme.  He has chronic neck pain and is having surgery in the next month.  He otherwise feels well.  He does not complain of weakness or fatigue today.  He has no neurologic complaints.  He denies any recent fevers or illnesses.  He has no chest pain, shortness of breath, cough, or hemoptysis.  He denies any nausea, vomiting, constipation, or diarrhea.  He has no melena or hematochezia.  He has no urinary complaints.  Patient offers no further specific complaints today.  REVIEW OF SYSTEMS:   Review of Systems  Constitutional: Negative.  Negative for fever, malaise/fatigue and weight loss.  Respiratory: Negative.  Negative for cough and shortness of breath.   Cardiovascular: Negative.  Negative for chest pain and leg swelling.  Gastrointestinal: Negative.  Negative for abdominal pain, blood in stool and melena.  Genitourinary: Negative.  Negative for hematuria.  Musculoskeletal: Positive for neck pain. Negative for back pain.  Skin: Negative.  Negative for rash.  Neurological: Negative for sensory change, focal weakness and weakness.  Psychiatric/Behavioral: Negative.  The patient is not nervous/anxious.     As per HPI. Otherwise, a complete review of systems is negative.  PAST MEDICAL HISTORY: Past Medical History:  Diagnosis Date  . Acute appendicitis 12/04/2017  . Allergy    takes allergy shots  . Anemia   . Arthritis   . Asthma   . Barrett esophagus   . Bronchitis   . Chronic pain   . Colon polyp   .  Depression   . GERD (gastroesophageal reflux disease)   . Hemorrhoid   . Hyperlipidemia   . Hypertension   . Hypothyroid   . IBS (irritable bowel syndrome)   . Kidney stone   . MI (myocardial infarction) ([place])    between 2002 and 2006  . Migraine   . Pneumonia   . Seizures ([place])   . Sinus problem   . Spinal cord stimulator status     PAST SURGICAL HISTORY: Past Surgical History:  Procedure Laterality Date  . ANTERIOR CERVICAL DISCECTOMY    . APPENDECTOMY    . BACK SURGERY    . COLONOSCOPY  August 2015  . elbow surgery    . HEMORRHOID SURGERY    . LAPAROSCOPIC APPENDECTOMY N/A 12/04/2017   Procedure: APPENDECTOMY LAPAROSCOPIC;  Surgeon: Vickie Epley, MD;  Location: [facility] ORS;  Service: General;  Laterality: N/A;  . LIPOMA EXCISION Right    leg  . 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 SPINAL CORD STIMULATOR IMPLANT    . UPPER GI ENDOSCOPY  August 2015    FAMILY HISTORY Family History  Problem Relation Age of Onset  . Hypertension Mother   . Lung disease Mother   . Heart attack Father   . Diabetes Father        ADVANCED DIRECTIVES:    HEALTH MAINTENANCE: Social History   Tobacco Use  . Smoking status: Former Smoker    Packs/day: 1.00    Years: 30.00    Pack years: 30.00    Types: Cigarettes  Quit date: 02/22/2005    Years since quitting: 13.8  . Smokeless tobacco: Never Used  Substance Use Topics  . Alcohol use: Yes    Alcohol/week: 0.0 standard drinks    Comment: occasionally  . Drug use: No    Comment: past     Colonoscopy:  PAP:  Bone density:  Lipid panel:  Allergies  Allergen Reactions  . Amoxicillin Anaphylaxis    REACTION: unspecified  . Penicillin G Anaphylaxis  . Gabapentin Other (See Comments)    REACTION: anaphylaxis  . Penicillins     REACTION: unspecified  . Adhesive [Tape] Rash    Band-aid    Current Outpatient Medications  Medication Sig Dispense Refill  . albuterol (VENTOLIN HFA) 108 (90 Base) MCG/ACT  inhaler Inhale 2 puffs into the lungs every 4 (four) hours as needed for wheezing or shortness of breath. 8.5 g 3  . alfuzosin (UROXATRAL) 10 MG 24 hr tablet TAKE 1 TABLET BY MOUTH DAILY 90 tablet 3  . ALPRAZolam (XANAX) 0.5 MG tablet Take 1 tablet (0.5 mg total) by mouth 3 (three) times daily as needed. for anxiety 90 tablet 2  . amphetamine-dextroamphetamine (ADDERALL) 20 MG tablet Take 1 tablet (20 mg total) by mouth 3 (three) times daily as needed. 90 tablet 0  . amphetamine-dextroamphetamine (ADDERALL) 20 MG tablet Take 1 tablet (20 mg total) by mouth daily. 90 tablet 0  . atorvastatin (LIPITOR) 10 MG tablet Take 1 tablet (10 mg total) by mouth daily. 90 tablet 3  . Azelastine HCl 137 MCG/SPRAY SOLN Place 1 spray into the nose 2 (two) times a day. 90 mL 1  . buPROPion (WELLBUTRIN XL) 300 MG 24 hr tablet TAKE ONE TABLET BY MOUTH DAILY. GENERIC EQUIVALENT FOR WELLBUTRIN XL. DUE FOR OFFICE VISIT. 90 tablet 1  . citalopram (CELEXA) 20 MG tablet TAKE 1 TABLET BY MOUTH DAILY 90 tablet 0  . clotrimazole-betamethasone (LOTRISONE) cream Apply 1 application topically 2 (two) times daily. 45 g 1  . esomeprazole (NEXIUM) 40 MG capsule TK ONE C PO  BID UTD  0  . l-methylfolate-B6-B12 (METANX) 3-35-2 MG TABS Take 1 tablet by mouth 2 (two) times daily.    Marland Kitchen losartan (COZAAR) 50 MG tablet Take 1 tablet (50 mg total) by mouth daily. 90 tablet 0  . meloxicam (MOBIC) 7.5 MG tablet TAKE 1 TABLET BY MOUTH 2 TIMES DAILY 180 tablet 1  . metoprolol succinate (TOPROL-XL) 25 MG 24 hr tablet Take 1 tablet (25 mg total) by mouth daily. 90 tablet 1  . mometasone-formoterol (DULERA) 100-5 MCG/ACT AERO Inhale 2 puffs into the lungs every 12 (twelve) hours. 3 Inhaler 3  . Multiple Vitamin (MULTI-VITAMINS) TABS Take by mouth.    Gean Birchwood ER 200 MG TB12 200 mg bid  0  . oxyCODONE-acetaminophen (PERCOCET) 7.5-325 MG per tablet Take 1 tablet by mouth every 4 (four) hours as needed for pain.     . pregabalin (LYRICA) 50 MG  capsule Take 100 mg by mouth 3 (three) times daily.     Marland Kitchen senna (SENOKOT) 8.6 MG tablet Take by mouth.    . SYRINGE-NEEDLE, DISP, 3 ML (MONOJECT 3CC SYR 22GX1") 22G X 1" 3 ML MISC Use as directed.    . testosterone cypionate (DEPOTESTOSTERONE CYPIONATE) 200 MG/ML injection INJECT 0.75 MLS (75MG  TOTAL) INTO THE MUSCLE EVERY 7 DAYS AS DIRECTED 10 mL 2  . TIROSINT 200 MCG CAPS TAKE 1 CAPSULE BY MOUTH DAILY 90 capsule 2   No current facility-administered medications for this visit.  OBJECTIVE: There were no vitals filed for this visit.   There is no height or weight on file to calculate BMI.    ECOG FS:0 - Asymptomatic  General: Well-developed, well-nourished, no acute distress. Eyes: Pink conjunctiva, anicteric sclera. HEENT: Normocephalic, moist mucous membranes. Lungs: Clear to auscultation bilaterally. Heart: Regular rate and rhythm. No rubs, murmurs, or gallops. Abdomen: Soft, nontender, nondistended. No organomegaly noted, normoactive bowel sounds. Musculoskeletal: No edema, cyanosis, or clubbing. Neuro: Alert, answering all questions appropriately. Cranial nerves grossly intact. Skin: No rashes or petechiae noted. Psych: Normal affect.  LAB RESULTS:  Lab Results  Component Value Date   NA 143 10/11/2018   K 4.8 10/11/2018   CL 103 10/11/2018   CO2 26 10/11/2018   GLUCOSE 99 10/11/2018   BUN 8 10/11/2018   CREATININE 1.16 10/11/2018   CALCIUM 8.5 (L) 10/11/2018   PROT 6.3 10/11/2018   ALBUMIN 4.2 10/11/2018   AST 23 10/11/2018   ALT 24 10/11/2018   ALKPHOS 96 10/11/2018   BILITOT 0.3 10/11/2018   GFRNONAA 70 10/11/2018   GFRAA 81 10/11/2018    Lab Results  Component Value Date   WBC 6.8 10/11/2018   NEUTROABS 4.0 10/11/2018   HGB 14.4 10/11/2018   HCT 43.5 10/11/2018   MCV 86 10/11/2018   PLT 278 10/11/2018   Lab Results  Component Value Date   IRON 52 09/12/2018   TIBC 368 09/12/2018   IRONPCTSAT 14 (L) 09/12/2018   Lab Results  Component Value Date    FERRITIN 20 (L) 09/12/2018     STUDIES: No results found.  ASSESSMENT: Iron deficiency anemia.  PLAN:    1.  Iron deficiency anemia: Patient's hemoglobin has significantly improved and is now 14.5.  His iron stores and ferritin are only mildly decreased.  He does not require additional Feraheme today.  Return to clinic in 4 months with repeat laboratory work, further evaluation, and continuation of treatment.    2. Family history of colon cancer:  Patient gets routine colonoscopies given his family history of colon cancer, all of which been unrevealing.  His next colonoscopy is due within the next year.  His genetic testing is negative. 3.  Neck pain: Patient reports he is having surgery in the next month.  I spent a total of 20 minutes face-to-face with the patient of which greater than 50% of the visit was spent in counseling and coordination of care as detailed above.   Patient expressed understanding and was in agreement with this plan. He also understands that He can call clinic at any time with any questions, concerns, or complaints.    Lloyd Huger, MD   01/06/2019 7:40 AM

## 2019-01-11 NOTE — Progress Notes (Deleted)
Called patient no answer left message  

## 2019-01-12 ENCOUNTER — Inpatient Hospital Stay: Payer: BC Managed Care – PPO | Admitting: Oncology

## 2019-01-12 ENCOUNTER — Inpatient Hospital Stay: Payer: BC Managed Care – PPO

## 2019-01-12 ENCOUNTER — Encounter: Payer: Self-pay | Admitting: Oncology

## 2019-01-22 DIAGNOSIS — M961 Postlaminectomy syndrome, not elsewhere classified: Secondary | ICD-10-CM | POA: Diagnosis not present

## 2019-01-22 DIAGNOSIS — M4722 Other spondylosis with radiculopathy, cervical region: Secondary | ICD-10-CM | POA: Diagnosis not present

## 2019-01-22 DIAGNOSIS — Z79891 Long term (current) use of opiate analgesic: Secondary | ICD-10-CM | POA: Diagnosis not present

## 2019-01-22 DIAGNOSIS — G894 Chronic pain syndrome: Secondary | ICD-10-CM | POA: Diagnosis not present

## 2019-01-26 ENCOUNTER — Telehealth: Payer: Self-pay

## 2019-01-26 NOTE — Telephone Encounter (Signed)
Confirmed appointment with patient. klh °

## 2019-01-30 ENCOUNTER — Other Ambulatory Visit: Payer: Self-pay

## 2019-01-30 ENCOUNTER — Encounter: Payer: Self-pay | Admitting: Adult Health

## 2019-01-30 ENCOUNTER — Ambulatory Visit: Payer: BC Managed Care – PPO | Admitting: Adult Health

## 2019-01-30 VITALS — BP 149/75 | HR 78 | Resp 16 | Ht 72.0 in | Wt 231.0 lb

## 2019-01-30 DIAGNOSIS — F339 Major depressive disorder, recurrent, unspecified: Secondary | ICD-10-CM | POA: Diagnosis not present

## 2019-01-30 DIAGNOSIS — I10 Essential (primary) hypertension: Secondary | ICD-10-CM

## 2019-01-30 DIAGNOSIS — Z79899 Other long term (current) drug therapy: Secondary | ICD-10-CM

## 2019-01-30 DIAGNOSIS — G47429 Narcolepsy in conditions classified elsewhere without cataplexy: Secondary | ICD-10-CM

## 2019-01-30 DIAGNOSIS — R41840 Attention and concentration deficit: Secondary | ICD-10-CM

## 2019-01-30 LAB — POCT URINE DRUG SCREEN
POC Amphetamine UR: POSITIVE — AB
POC BENZODIAZEPINES UR: POSITIVE — AB
POC Barbiturate UR: NOT DETECTED
POC Cocaine UR: NOT DETECTED
POC Ecstasy UR: NOT DETECTED
POC Marijuana UR: NOT DETECTED
POC Methadone UR: NOT DETECTED
POC Methamphetamine UR: NOT DETECTED
POC Opiate Ur: POSITIVE — AB
POC Oxycodone UR: POSITIVE — AB
POC PHENCYCLIDINE UR: NOT DETECTED
POC TRICYCLICS UR: POSITIVE — AB

## 2019-01-30 MED ORDER — AMPHETAMINE-DEXTROAMPHETAMINE 20 MG PO TABS: 20.0000 mg | ORAL_TABLET | Freq: Three times a day (TID) | ORAL | 0 refills | Status: DC

## 2019-01-30 MED ORDER — CITALOPRAM HYDROBROMIDE 20 MG PO TABS: 20.0000 mg | ORAL_TABLET | Freq: Every day | ORAL | 0 refills | Status: DC

## 2019-01-30 MED ORDER — AMPHETAMINE-DEXTROAMPHETAMINE 20 MG PO TABS: 20.0000 mg | ORAL_TABLET | Freq: Three times a day (TID) | ORAL | 0 refills | Status: DC | PRN

## 2019-01-30 NOTE — Progress Notes (Addendum)
[facility] [place], [place] [address]  Internal MEDICINE  Office Visit Note  Patient Name: Randy Mclean  [id number]  [id number]  Date of Service: 02/19/2019  Chief Complaint  Patient presents with  . Medical Management of Chronic Issues  . Hypertension  . narcolepsy  . ADD  . Medication Refill    celexa  . Hand Problem    left thumb is bothering him due to arthritis    HPI  Pt is here for follow up on Narcolepsy, depression, and HTN.  Overall he appears at baseline.  His bp is 149/75 today.  He is having some pain today with his back as well as his left thumb.  He sees pain management for his back and takes multiple mediations.  His thumb has been bothering him for years, and he believes it is arthritis.  His depress is well controlled at this time. He continues to have symptoms of narcolepsy, despite is adderall usage, but he is able to manage at this time.    Current Medication: Outpatient Encounter Medications as of 01/30/2019  Medication Sig  . albuterol (VENTOLIN HFA) 108 (90 Base) MCG/ACT inhaler Inhale 2 puffs into the lungs every 4 (four) hours as needed for wheezing or shortness of breath.  . alfuzosin (UROXATRAL) 10 MG 24 hr tablet TAKE 1 TABLET BY MOUTH DAILY  . amphetamine-dextroamphetamine (ADDERALL) 20 MG tablet Take 1 tablet (20 mg total) by mouth daily.  Marland Kitchen amphetamine-dextroamphetamine (ADDERALL) 20 MG tablet Take 1 tablet (20 mg total) by mouth 3 (three) times daily as needed.  Marland Kitchen atorvastatin (LIPITOR) 10 MG tablet Take 1 tablet (10 mg total) by mouth daily.  . Azelastine HCl 137 MCG/SPRAY SOLN Place 1 spray into the nose 2 (two) times a day.  Marland Kitchen buPROPion (WELLBUTRIN XL) 300 MG 24 hr tablet TAKE ONE TABLET BY MOUTH DAILY. GENERIC EQUIVALENT FOR WELLBUTRIN XL. DUE FOR OFFICE VISIT.  Marland Kitchen citalopram (CELEXA) 20 MG tablet Take 1 tablet (20 mg total) by mouth daily.  . clotrimazole-betamethasone (LOTRISONE) cream Apply 1 application  topically 2 (two) times daily.  Marland Kitchen esomeprazole (NEXIUM) 40 MG capsule TK ONE C PO  BID UTD  . l-methylfolate-B6-B12 (METANX) 3-35-2 MG TABS Take 1 tablet by mouth 2 (two) times daily.  . mometasone-formoterol (DULERA) 100-5 MCG/ACT AERO Inhale 2 puffs into the lungs every 12 (twelve) hours.  . Multiple Vitamin (MULTI-VITAMINS) TABS Take by mouth.  Gean Birchwood ER 200 MG TB12 200 mg bid  . oxyCODONE-acetaminophen (PERCOCET) 7.5-325 MG per tablet Take 1 tablet by mouth every 4 (four) hours as needed for pain.   . pregabalin (LYRICA) 50 MG capsule Take 100 mg by mouth 3 (three) times daily.   Marland Kitchen senna (SENOKOT) 8.6 MG tablet Take by mouth.  . SYRINGE-NEEDLE, DISP, 3 ML (MONOJECT 3CC SYR 22GX1") 22G X 1" 3 ML MISC Use as directed.  . testosterone cypionate (DEPOTESTOSTERONE CYPIONATE) 200 MG/ML injection INJECT 0.75 MLS (75MG  TOTAL) INTO THE MUSCLE EVERY 7 DAYS AS DIRECTED  . TIROSINT 200 MCG CAPS TAKE 1 CAPSULE BY MOUTH DAILY  . [DISCONTINUED] ALPRAZolam (XANAX) 0.5 MG tablet Take 1 tablet (0.5 mg total) by mouth 3 (three) times daily as needed. for anxiety  . [DISCONTINUED] amphetamine-dextroamphetamine (ADDERALL) 20 MG tablet Take 1 tablet (20 mg total) by mouth 3 (three) times daily as needed.  . [DISCONTINUED] citalopram (CELEXA) 20 MG tablet TAKE 1 TABLET BY MOUTH DAILY  . [DISCONTINUED] losartan (COZAAR) 50 MG tablet Take 1  tablet (50 mg total) by mouth daily.  . [DISCONTINUED] meloxicam (MOBIC) 7.5 MG tablet TAKE 1 TABLET BY MOUTH 2 TIMES DAILY  . [DISCONTINUED] metoprolol succinate (TOPROL-XL) 25 MG 24 hr tablet Take 1 tablet (25 mg total) by mouth daily.  Marland Kitchen amphetamine-dextroamphetamine (ADDERALL) 20 MG tablet Take 1 tablet (20 mg total) by mouth 3 (three) times daily.   No facility-administered encounter medications on file as of 01/30/2019.    Surgical History: Past Surgical History:  Procedure Laterality Date  . ANTERIOR CERVICAL DISCECTOMY    . APPENDECTOMY    . BACK SURGERY    .  COLONOSCOPY  August 2015  . elbow surgery    . HEMORRHOID SURGERY    . LAPAROSCOPIC APPENDECTOMY N/A 12/04/2017   Procedure: APPENDECTOMY LAPAROSCOPIC;  Surgeon: Vickie Epley, MD;  Location: [facility] ORS;  Service: General;  Laterality: N/A;  . LIPOMA EXCISION Right    leg  . 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 SPINAL CORD STIMULATOR IMPLANT    . UPPER GI ENDOSCOPY  August 2015    Medical History: Past Medical History:  Diagnosis Date  . Acute appendicitis 12/04/2017  . Allergy    takes allergy shots  . Anemia   . Arthritis   . Asthma   . Barrett esophagus   . Bronchitis   . Chronic pain   . Colon polyp   . Depression   . GERD (gastroesophageal reflux disease)   . Hemorrhoid   . Hyperlipidemia   . Hypertension   . Hypothyroid   . IBS (irritable bowel syndrome)   . Kidney stone   . MI (myocardial infarction) ([place])    between 2002 and 2006  . Migraine   . Pneumonia   . Seizures ([place])   . Sinus problem   . Spinal cord stimulator status     Family History: Family History  Problem Relation Age of Onset  . Hypertension Mother   . Lung disease Mother   . Heart attack Father   . Diabetes Father     Social History   Socioeconomic History  . Marital status: Married    Spouse name: Not on file  . Number of children: Not on file  . Years of education: Not on file  . Highest education level: Not on file  Occupational History  . Not on file  Tobacco Use  . Smoking status: Former Smoker    Packs/day: 1.00    Years: 30.00    Pack years: 30.00    Types: Cigarettes    Quit date: 02/22/2005    Years since quitting: 14.0  . Smokeless tobacco: Never Used  Substance and Sexual Activity  . Alcohol use: Yes    Alcohol/week: 0.0 standard drinks    Comment: occasionally  . Drug use: No    Comment: past  . Sexual activity: Yes  Other Topics Concern  . Not on file  Social History Narrative  . Not on file   Social Determinants of Health   Financial  Resource Strain:   . Difficulty of Paying Living Expenses: Not on file  Food Insecurity:   . Worried About Charity fundraiser in the Last Year: Not on file  . Ran Out of Food in the Last Year: Not on file  Transportation Needs:   . Lack of Transportation (Medical): Not on file  . Lack of Transportation (Non-Medical): Not on file  Physical Activity:   . Days of Exercise per Week: Not on  file  . Minutes of Exercise per Session: Not on file  Stress:   . Feeling of Stress : Not on file  Social Connections:   . Frequency of Communication with Friends and Family: Not on file  . Frequency of Social Gatherings with Friends and Family: Not on file  . Attends Religious Services: Not on file  . Active Member of Clubs or Organizations: Not on file  . Attends Archivist Meetings: Not on file  . Marital Status: Not on file  Intimate Partner Violence:   . Fear of Current or Ex-Partner: Not on file  . Emotionally Abused: Not on file  . Physically Abused: Not on file  . Sexually Abused: Not on file      Review of Systems  Constitutional: Negative.  Negative for chills, fatigue and unexpected weight change.  HENT: Negative.  Negative for congestion, rhinorrhea, sneezing and sore throat.   Eyes: Negative for redness.  Respiratory: Negative.  Negative for cough, chest tightness and shortness of breath.   Cardiovascular: Negative.  Negative for chest pain and palpitations.  Gastrointestinal: Negative.  Negative for abdominal pain, constipation, diarrhea, nausea and vomiting.  Endocrine: Negative.   Genitourinary: Negative.  Negative for dysuria and frequency.  Musculoskeletal: Negative.  Negative for arthralgias, back pain, joint swelling and neck pain.  Skin: Negative.  Negative for rash.  Allergic/Immunologic: Negative.   Neurological: Negative.  Negative for tremors and numbness.  Hematological: Negative for adenopathy. Does not bruise/bleed easily.  Psychiatric/Behavioral:  Negative.  Negative for behavioral problems, sleep disturbance and suicidal ideas. The patient is not nervous/anxious.     Vital Signs: BP (!) 149/75   Pulse 78   Resp 16   Ht 6' (1.829 m)   Wt 231 lb (104.8 kg)   SpO2 94%   BMI 31.33 kg/m    Physical Exam Vitals signs and nursing note reviewed.  Constitutional:      General: He is not in acute distress.    Appearance: He is well-developed. He is not diaphoretic.  HENT:     Head: Normocephalic and atraumatic.     Mouth/Throat:     Pharynx: No oropharyngeal exudate.  Eyes:     Pupils: Pupils are equal, round, and reactive to light.  Neck:     Musculoskeletal: Normal range of motion and neck supple.     Thyroid: No thyromegaly.     Vascular: No JVD.     Trachea: No tracheal deviation.  Cardiovascular:     Rate and Rhythm: Normal rate and regular rhythm.     Heart sounds: Normal heart sounds. No murmur. No friction rub. No gallop.   Pulmonary:     Effort: Pulmonary effort is normal. No respiratory distress.     Breath sounds: Normal breath sounds. No wheezing or rales.  Chest:     Chest wall: No tenderness.  Abdominal:     Palpations: Abdomen is soft.     Tenderness: There is no abdominal tenderness. There is no guarding.  Musculoskeletal: Normal range of motion.  Lymphadenopathy:     Cervical: No cervical adenopathy.  Skin:    General: Skin is warm and dry.  Neurological:     Mental Status: He is alert and oriented to person, place, and time.     Cranial Nerves: No cranial nerve deficit.  Psychiatric:        Behavior: Behavior normal.        Thought Content: Thought content normal.  Judgment: Judgment normal.     Assessment/Plan: 1. Narcolepsy due to underlying condition without cataplexy Continue present management.  Refilled Controlled medications today. Reviewed risks and possible side effects associated with taking Stimulants. Combination of these drugs with other psychotropic medications could cause  dizziness and drowsiness. Pt needs to Monitor symptoms and exercise caution in driving and operating heavy machinery to avoid damages to oneself, to others and to the surroundings. Patient verbalized understanding in this matter. Dependence and abuse for these drugs will be monitored closely. A Controlled substance policy and procedure is on file which allows [facility] to order a urine drug screen test at any visit. Patient understands and agrees with the plan.. - POCT Urine Drug Screen - amphetamine-dextroamphetamine (ADDERALL) 20 MG tablet; Take 1 tablet (20 mg total) by mouth 3 (three) times daily as needed.  Dispense: 90 tablet; Refill: 0 - amphetamine-dextroamphetamine (ADDERALL) 20 MG tablet; Take 1 tablet (20 mg total) by mouth 3 (three) times daily.  Dispense: 90 tablet; Refill: 0  2. Essential hypertension Continue to medications as prescribed.   3. Depression, recurrent (HCC) Stable, continue celexa - citalopram (CELEXA) 20 MG tablet; Take 1 tablet (20 mg total) by mouth daily.  Dispense: 90 tablet; Refill: 0  4. High risk medication use - POCT Urine Drug Screen  General Counseling: Randy Mclean verbalizes understanding of the findings of todays visit and agrees with plan of treatment. I have discussed any further diagnostic evaluation that may be needed or ordered today. We also reviewed his medications today. he has been encouraged to call the office with any questions or concerns that should arise related to todays visit.    Orders Placed This Encounter  Procedures  . POCT Urine Drug Screen    Meds ordered this encounter  Medications  . citalopram (CELEXA) 20 MG tablet    Sig: Take 1 tablet (20 mg total) by mouth daily.    Dispense:  90 tablet    Refill:  0  . amphetamine-dextroamphetamine (ADDERALL) 20 MG tablet    Sig: Take 1 tablet (20 mg total) by mouth 3 (three) times daily as needed.    Dispense:  90 tablet    Refill:  0  . amphetamine-dextroamphetamine  (ADDERALL) 20 MG tablet    Sig: Take 1 tablet (20 mg total) by mouth 3 (three) times daily.    Dispense:  90 tablet    Refill:  0    Time spent: 25 Minutes   This patient was seen by Orson Gear AGNP-C in Collaboration with Dr Lavera Guise as a part of collaborative care agreement     Kendell Mclean AGNP-C Internal medicine

## 2019-02-11 ENCOUNTER — Other Ambulatory Visit: Payer: Self-pay | Admitting: Adult Health

## 2019-02-12 ENCOUNTER — Other Ambulatory Visit: Payer: Self-pay

## 2019-02-12 ENCOUNTER — Telehealth: Payer: Self-pay

## 2019-02-12 MED ORDER — MELOXICAM 7.5 MG PO TABS: 7.5000 mg | ORAL_TABLET | Freq: Two times a day (BID) | ORAL | 1 refills | Status: DC

## 2019-02-12 MED ORDER — LOSARTAN POTASSIUM 50 MG PO TABS: 50.0000 mg | ORAL_TABLET | Freq: Every day | ORAL | 0 refills | Status: DC

## 2019-02-14 ENCOUNTER — Other Ambulatory Visit: Payer: Self-pay | Admitting: Adult Health

## 2019-02-14 DIAGNOSIS — F410 Panic disorder [episodic paroxysmal anxiety] without agoraphobia: Secondary | ICD-10-CM

## 2019-02-14 MED ORDER — ALPRAZOLAM 0.5 MG PO TABS: 0.5000 mg | ORAL_TABLET | Freq: Three times a day (TID) | ORAL | 2 refills | Status: DC | PRN

## 2019-02-14 NOTE — Progress Notes (Signed)
Renewed Xanax RX for patient

## 2019-02-19 NOTE — Telephone Encounter (Signed)
Send med 

## 2019-03-16 ENCOUNTER — Other Ambulatory Visit: Payer: Self-pay

## 2019-03-16 DIAGNOSIS — J4521 Mild intermittent asthma with (acute) exacerbation: Secondary | ICD-10-CM

## 2019-03-16 DIAGNOSIS — IMO0001 Reserved for inherently not codable concepts without codable children: Secondary | ICD-10-CM

## 2019-03-16 MED ORDER — ALBUTEROL SULFATE HFA 108 (90 BASE) MCG/ACT IN AERS: 2.0000 | INHALATION_SPRAY | RESPIRATORY_TRACT | 3 refills | Status: DC | PRN

## 2019-03-24 ENCOUNTER — Other Ambulatory Visit: Payer: Self-pay | Admitting: Adult Health

## 2019-04-23 ENCOUNTER — Other Ambulatory Visit: Payer: Self-pay | Admitting: Adult Health

## 2019-04-23 DIAGNOSIS — G47429 Narcolepsy in conditions classified elsewhere without cataplexy: Secondary | ICD-10-CM

## 2019-04-23 DIAGNOSIS — M961 Postlaminectomy syndrome, not elsewhere classified: Secondary | ICD-10-CM | POA: Diagnosis not present

## 2019-04-23 DIAGNOSIS — Z79891 Long term (current) use of opiate analgesic: Secondary | ICD-10-CM | POA: Diagnosis not present

## 2019-04-23 DIAGNOSIS — M4722 Other spondylosis with radiculopathy, cervical region: Secondary | ICD-10-CM | POA: Diagnosis not present

## 2019-04-23 DIAGNOSIS — G894 Chronic pain syndrome: Secondary | ICD-10-CM | POA: Diagnosis not present

## 2019-04-24 ENCOUNTER — Other Ambulatory Visit: Payer: Self-pay | Admitting: Adult Health

## 2019-04-24 MED ORDER — AMPHETAMINE-DEXTROAMPHETAMINE 20 MG PO TABS: 20.0000 mg | ORAL_TABLET | Freq: Three times a day (TID) | ORAL | 0 refills | Status: DC

## 2019-04-24 NOTE — Progress Notes (Signed)
Refilled patients adderall, until next visit.

## 2019-04-26 ENCOUNTER — Telehealth: Payer: Self-pay

## 2019-04-26 NOTE — Telephone Encounter (Signed)
Confirmed appointment on 04/30/2019 and screened for covid. klh °

## 2019-04-27 ENCOUNTER — Other Ambulatory Visit: Payer: Self-pay | Admitting: Adult Health

## 2019-04-30 ENCOUNTER — Ambulatory Visit: Payer: BC Managed Care – PPO | Admitting: Adult Health

## 2019-04-30 ENCOUNTER — Encounter: Payer: Self-pay | Admitting: Adult Health

## 2019-04-30 ENCOUNTER — Other Ambulatory Visit: Payer: Self-pay

## 2019-04-30 VITALS — BP 142/72 | HR 78 | Temp 97.6°F | Resp 16 | Ht 72.0 in | Wt 219.0 lb

## 2019-04-30 DIAGNOSIS — R5383 Other fatigue: Secondary | ICD-10-CM | POA: Diagnosis not present

## 2019-04-30 DIAGNOSIS — G47429 Narcolepsy in conditions classified elsewhere without cataplexy: Secondary | ICD-10-CM | POA: Diagnosis not present

## 2019-04-30 DIAGNOSIS — E785 Hyperlipidemia, unspecified: Secondary | ICD-10-CM

## 2019-04-30 DIAGNOSIS — I10 Essential (primary) hypertension: Secondary | ICD-10-CM

## 2019-04-30 DIAGNOSIS — E291 Testicular hypofunction: Secondary | ICD-10-CM | POA: Diagnosis not present

## 2019-04-30 MED ORDER — AMPHETAMINE-DEXTROAMPHETAMINE 20 MG PO TABS: 20.0000 mg | ORAL_TABLET | Freq: Three times a day (TID) | ORAL | 0 refills | Status: DC

## 2019-04-30 NOTE — Progress Notes (Signed)
[facility] [place], [place] [address]  Internal MEDICINE  Office Visit Note  Patient Name: Randy Mclean Younger  [id number]  [id number]  Date of Service: 04/30/2019  Chief Complaint  Patient presents with  . Follow-up    light headed, fell down yesterday, neck hit the stair  . Gastroesophageal Reflux  . Hypertension  . Hyperlipidemia  . Fatigue    no energy    HPI Pt is here for follow up on HTN, HLD, and narcolepsy. He is complaining of increased fatigue lately.  He has a history of anemia, that he sees Hematology for, but he missed his last appt due to covid.   Pt reports yesterday he slipped while going down his garage stairs.  He denies any LOC.  He did hit is neck on the steps.  He surprisingly does not have much pain today.  He has no bruising at this time.   Current Medication: Outpatient Encounter Medications as of 04/30/2019  Medication Sig  . albuterol (VENTOLIN HFA) 108 (90 Base) MCG/ACT inhaler Inhale 2 puffs into the lungs every 4 (four) hours as needed for wheezing or shortness of breath.  . alfuzosin (UROXATRAL) 10 MG 24 hr tablet TAKE 1 TABLET BY MOUTH DAILY  . ALPRAZolam (XANAX) 0.5 MG tablet Take 1 tablet (0.5 mg total) by mouth 3 (three) times daily as needed. for anxiety  . amphetamine-dextroamphetamine (ADDERALL) 20 MG tablet Take 1 tablet (20 mg total) by mouth 3 (three) times daily.  Marland Kitchen atorvastatin (LIPITOR) 10 MG tablet Take 1 tablet (10 mg total) by mouth daily.  Marland Kitchen azelastine (ASTELIN) 0.1 % nasal spray USE 1 SPRAY NASALLY 2 TIMES A DAY  . buPROPion (WELLBUTRIN XL) 300 MG 24 hr tablet TAKE 1 TABLET BY MOUTH DAILY. GENERIC EQUIVALENT FOR WELLBUTRIL XL. DUE FOR OFFICE VISIT  . citalopram (CELEXA) 20 MG tablet Take 1 tablet (20 mg total) by mouth daily.  . clotrimazole-betamethasone (LOTRISONE) cream Apply 1 application topically 2 (two) times daily.  Marland Kitchen esomeprazole (NEXIUM) 40 MG capsule TK ONE C PO  BID UTD  .  l-methylfolate-B6-B12 (METANX) 3-35-2 MG TABS Take 1 tablet by mouth 2 (two) times daily.  Marland Kitchen losartan (COZAAR) 50 MG tablet Take 1 tablet (50 mg total) by mouth daily.  . meloxicam (MOBIC) 7.5 MG tablet Take 1 tablet (7.5 mg total) by mouth 2 (two) times daily.  . metoprolol succinate (TOPROL-XL) 25 MG 24 hr tablet TAKE 1 TABLET BY MOUTH DAILY GENERIC EQUIVALENT FOR TOPROL XL  . mometasone-formoterol (DULERA) 100-5 MCG/ACT AERO Inhale 2 puffs into the lungs every 12 (twelve) hours.  . Multiple Vitamin (MULTI-VITAMINS) TABS Take by mouth.  Gean Birchwood ER 200 MG TB12 200 mg bid  . oxyCODONE-acetaminophen (PERCOCET) 7.5-325 MG per tablet Take 1 tablet by mouth every 4 (four) hours as needed for pain.   . pregabalin (LYRICA) 50 MG capsule Take 100 mg by mouth 3 (three) times daily.   Marland Kitchen senna (SENOKOT) 8.6 MG tablet Take by mouth.  . SYRINGE-NEEDLE, DISP, 3 ML (MONOJECT 3CC SYR 22GX1") 22G X 1" 3 ML MISC Use as directed.  . testosterone cypionate (DEPOTESTOSTERONE CYPIONATE) 200 MG/ML injection INJECT 0.75 MLS (75MG  TOTAL) INTO THE MUSCLE EVERY 7 DAYS AS DIRECTED  . TIROSINT 200 MCG CAPS TAKE 1 CAPSULE BY MOUTH DAILY  . [DISCONTINUED] amphetamine-dextroamphetamine (ADDERALL) 20 MG tablet Take 1 tablet (20 mg total) by mouth 3 (three) times daily as needed.  . [DISCONTINUED] amphetamine-dextroamphetamine (ADDERALL) 20 MG tablet Take  1 tablet (20 mg total) by mouth 3 (three) times daily.  Derrill Memo ON 05/25/2019] amphetamine-dextroamphetamine (ADDERALL) 20 MG tablet Take 1 tablet (20 mg total) by mouth in the morning, at noon, and at bedtime.   No facility-administered encounter medications on file as of 04/30/2019.    Surgical History: Past Surgical History:  Procedure Laterality Date  . ANTERIOR CERVICAL DISCECTOMY    . APPENDECTOMY    . BACK SURGERY    . COLONOSCOPY  August 2015  . elbow surgery    . HEMORRHOID SURGERY    . LAPAROSCOPIC APPENDECTOMY N/A 12/04/2017   Procedure: APPENDECTOMY  LAPAROSCOPIC;  Surgeon: Vickie Epley, MD;  Location: [facility] ORS;  Service: General;  Laterality: N/A;  . LIPOMA EXCISION Right    leg  . 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 SPINAL CORD STIMULATOR IMPLANT    . UPPER GI ENDOSCOPY  August 2015    Medical History: Past Medical History:  Diagnosis Date  . Acute appendicitis 12/04/2017  . Allergy    takes allergy shots  . Anemia   . Arthritis   . Asthma   . Barrett esophagus   . Bronchitis   . Chronic pain   . Colon polyp   . Depression   . GERD (gastroesophageal reflux disease)   . Hemorrhoid   . Hyperlipidemia   . Hypertension   . Hypothyroid   . IBS (irritable bowel syndrome)   . Kidney stone   . MI (myocardial infarction) ([place])    between 2002 and 2006  . Migraine   . Pneumonia   . Seizures ([place])   . Sinus problem   . Spinal cord stimulator status     Family History: Family History  Problem Relation Age of Onset  . Hypertension Mother   . Lung disease Mother   . Heart attack Father   . Diabetes Father     Social History   Socioeconomic History  . Marital status: Married    Spouse name: Not on file  . Number of children: Not on file  . Years of education: Not on file  . Highest education level: Not on file  Occupational History  . Not on file  Tobacco Use  . Smoking status: Former Smoker    Packs/day: 1.00    Years: 30.00    Pack years: 30.00    Types: Cigarettes    Quit date: 02/22/2005    Years since quitting: 14.1  . Smokeless tobacco: Never Used  Substance and Sexual Activity  . Alcohol use: Yes    Alcohol/week: 0.0 standard drinks    Comment: occasionally  . Drug use: No    Comment: past  . Sexual activity: Yes  Other Topics Concern  . Not on file  Social History Narrative  . Not on file   Social Determinants of Health   Financial Resource Strain:   . Difficulty of Paying Living Expenses: Not on file  Food Insecurity:   . Worried About Charity fundraiser in the Last  Year: Not on file  . Ran Out of Food in the Last Year: Not on file  Transportation Needs:   . Lack of Transportation (Medical): Not on file  . Lack of Transportation (Non-Medical): Not on file  Physical Activity:   . Days of Exercise per Week: Not on file  . Minutes of Exercise per Session: Not on file  Stress:   . Feeling of Stress : Not on file  Social  Connections:   . Frequency of Communication with Friends and Family: Not on file  . Frequency of Social Gatherings with Friends and Family: Not on file  . Attends Religious Services: Not on file  . Active Member of Clubs or Organizations: Not on file  . Attends Archivist Meetings: Not on file  . Marital Status: Not on file  Intimate Partner Violence:   . Fear of Current or Ex-Partner: Not on file  . Emotionally Abused: Not on file  . Physically Abused: Not on file  . Sexually Abused: Not on file      Review of Systems  Constitutional: Negative.  Negative for chills, fatigue and unexpected weight change.  HENT: Negative.  Negative for congestion, rhinorrhea, sneezing and sore throat.   Eyes: Negative for redness.  Respiratory: Negative.  Negative for cough, chest tightness and shortness of breath.   Cardiovascular: Negative.  Negative for chest pain and palpitations.  Gastrointestinal: Negative.  Negative for abdominal pain, constipation, diarrhea, nausea and vomiting.  Endocrine: Negative.   Genitourinary: Negative.  Negative for dysuria and frequency.  Musculoskeletal: Negative.  Negative for arthralgias, back pain, joint swelling and neck pain.  Skin: Negative.  Negative for rash.  Allergic/Immunologic: Negative.   Neurological: Negative.  Negative for tremors and numbness.  Hematological: Negative for adenopathy. Does not bruise/bleed easily.  Psychiatric/Behavioral: Negative.  Negative for behavioral problems, sleep disturbance and suicidal ideas. The patient is not nervous/anxious.     Vital Signs: BP (!)  142/72   Pulse 78   Temp 97.6 F (36.4 C)   Resp 16   Ht 6' (1.829 m)   Wt 219 lb (99.3 kg)   SpO2 95%   BMI 29.70 kg/m    Physical Exam Vitals and nursing note reviewed.  Constitutional:      General: He is not in acute distress.    Appearance: He is well-developed. He is not diaphoretic.  HENT:     Head: Normocephalic and atraumatic.     Mouth/Throat:     Pharynx: No oropharyngeal exudate.  Eyes:     Pupils: Pupils are equal, round, and reactive to light.  Neck:     Thyroid: No thyromegaly.     Vascular: No JVD.     Trachea: No tracheal deviation.  Cardiovascular:     Rate and Rhythm: Normal rate and regular rhythm.     Heart sounds: Normal heart sounds. No murmur. No friction rub. No gallop.   Pulmonary:     Effort: Pulmonary effort is normal. No respiratory distress.     Breath sounds: Normal breath sounds. No wheezing or rales.  Chest:     Chest wall: No tenderness.  Abdominal:     Palpations: Abdomen is soft.     Tenderness: There is no abdominal tenderness. There is no guarding.  Musculoskeletal:        General: Normal range of motion.     Cervical back: Normal range of motion and neck supple.  Lymphadenopathy:     Cervical: No cervical adenopathy.  Skin:    General: Skin is warm and dry.  Neurological:     Mental Status: He is alert and oriented to person, place, and time.     Cranial Nerves: No cranial nerve deficit.  Psychiatric:        Behavior: Behavior normal.        Thought Content: Thought content normal.        Judgment: Judgment normal.    Assessment/Plan: 1. Other  fatigue Have labs drawn and follow up in 2 weeks.  - B12 and Folate Panel - Vitamin D (25 hydroxy) - Fe+TIBC+Fer  2. 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 Reviewed risks and possible side effects associated with taking Stimulants. Combination of these drugs with other psychotropic medications could cause dizziness and  drowsiness. Pt needs to Monitor symptoms and exercise caution in driving and operating heavy machinery to avoid damages to oneself, to others and to the surroundings. Patient verbalized understanding in this matter. Dependence and abuse for these drugs will be monitored closely. A Controlled substance policy and procedure is on file which allows [facility] to order a urine drug screen test at any visit. Patient understands and agrees with the plan.. - amphetamine-dextroamphetamine (ADDERALL) 20 MG tablet; Take 1 tablet (20 mg total) by mouth in the morning, at noon, and at bedtime.  Dispense: 90 tablet; Refill: 0  3. Hypogonadism, testicular Will check testosterone and prolactin - Testosterone,Free and Total - Prolactin  4. Essential hypertension Controlled, continue present mgmt.   5. Hyperlipidemia, unspecified hyperlipidemia type Stable, continue to take Lipitor as prescribed.   General Counseling: ion wiesner understanding of the findings of todays visit and agrees with plan of treatment. I have discussed any further diagnostic evaluation that may be needed or ordered today. We also reviewed his medications today. he has been encouraged to call the office with any questions or concerns that should arise related to todays visit.    Orders Placed This Encounter  Procedures  . B12 and Folate Panel  . Vitamin D (25 hydroxy)  . Fe+TIBC+Fer  . Testosterone,Free and Total  . Prolactin    Meds ordered this encounter  Medications  . amphetamine-dextroamphetamine (ADDERALL) 20 MG tablet    Sig: Take 1 tablet (20 mg total) by mouth in the morning, at noon, and at bedtime.    Dispense:  90 tablet    Refill:  0    Do not fill before 05/25/2019    Time spent: 30 Minutes   This patient was seen by Orson Gear AGNP-C in Collaboration with Dr Lavera Guise as a part of collaborative care agreement     Kendell Bane AGNP-C Internal medicine

## 2019-05-02 ENCOUNTER — Ambulatory Visit: Payer: BC Managed Care – PPO | Admitting: Adult Health

## 2019-05-05 ENCOUNTER — Emergency Department: Payer: BC Managed Care – PPO

## 2019-05-05 ENCOUNTER — Emergency Department
Admission: EM | Admit: 2019-05-05 | Discharge: 2019-05-05 | Disposition: A | Discharge status: Home / Self Care | Payer: BC Managed Care – PPO | Attending: Emergency Medicine | Admitting: Emergency Medicine

## 2019-05-05 ENCOUNTER — Other Ambulatory Visit: Payer: Self-pay

## 2019-05-05 ENCOUNTER — Encounter: Payer: Self-pay | Admitting: Emergency Medicine

## 2019-05-05 DIAGNOSIS — I10 Essential (primary) hypertension: Secondary | ICD-10-CM | POA: Insufficient documentation

## 2019-05-05 DIAGNOSIS — J449 Chronic obstructive pulmonary disease, unspecified: Secondary | ICD-10-CM | POA: Diagnosis not present

## 2019-05-05 DIAGNOSIS — E039 Hypothyroidism, unspecified: Secondary | ICD-10-CM | POA: Diagnosis not present

## 2019-05-05 DIAGNOSIS — Z87891 Personal history of nicotine dependence: Secondary | ICD-10-CM | POA: Insufficient documentation

## 2019-05-05 DIAGNOSIS — R079 Chest pain, unspecified: Secondary | ICD-10-CM | POA: Diagnosis not present

## 2019-05-05 DIAGNOSIS — Z79899 Other long term (current) drug therapy: Secondary | ICD-10-CM | POA: Diagnosis not present

## 2019-05-05 DIAGNOSIS — R0789 Other chest pain: Secondary | ICD-10-CM | POA: Diagnosis not present

## 2019-05-05 DIAGNOSIS — J45909 Unspecified asthma, uncomplicated: Secondary | ICD-10-CM | POA: Diagnosis not present

## 2019-05-05 LAB — TROPONIN I (HIGH SENSITIVITY)
Troponin I (High Sensitivity): 5 ng/L (ref <18)
Troponin I (High Sensitivity): 4 ng/L (ref <18)

## 2019-05-05 LAB — CBC
HCT: 33.1 % — ABNORMAL LOW (ref 39.0–52.0)
Hemoglobin: 9.7 g/dL — ABNORMAL LOW (ref 13.0–17.0)
MCH: 21.7 pg — ABNORMAL LOW (ref 26.0–34.0)
MCHC: 29.3 g/dL — ABNORMAL LOW (ref 30.0–36.0)
MCV: 74.2 fL — ABNORMAL LOW (ref 80.0–100.0)
Platelets: 272 10*3/uL (ref 150–400)
RBC: 4.46 MIL/uL (ref 4.22–5.81)
RDW: 17.6 % — ABNORMAL HIGH (ref 11.5–15.5)
WBC: 6.2 10*3/uL (ref 4.0–10.5)
nRBC: 0 % (ref 0.0–0.2)

## 2019-05-05 LAB — BASIC METABOLIC PANEL
Anion gap: 7 (ref 5–15)
BUN: 12 mg/dL (ref 6–20)
CO2: 28 mmol/L (ref 22–32)
Calcium: 8.4 mg/dL — ABNORMAL LOW (ref 8.9–10.3)
Chloride: 104 mmol/L (ref 98–111)
Creatinine, Ser: 0.99 mg/dL (ref 0.61–1.24)
GFR calc Af Amer: >60 mL/min (ref >60)
GFR calc non Af Amer: >60 mL/min (ref >60)
Glucose, Bld: 112 mg/dL — ABNORMAL HIGH (ref 70–99)
Potassium: 3.7 mmol/L (ref 3.5–5.1)
Sodium: 139 mmol/L (ref 135–145)

## 2019-05-05 MED ORDER — ASPIRIN 81 MG PO CHEW
162.0000 mg | CHEWABLE_TABLET | Freq: Once | ORAL | Status: AC
  Administered 2019-05-05: 162 mg via ORAL
  Filled 2019-05-05: qty 2

## 2019-05-05 MED ORDER — NITROGLYCERIN 0.4 MG SL SUBL
0.4000 mg | SUBLINGUAL_TABLET | Freq: Once | SUBLINGUAL | Status: AC
  Administered 2019-05-05: 0.4 mg via SUBLINGUAL
  Filled 2019-05-05: qty 1

## 2019-05-05 MED ORDER — SODIUM CHLORIDE 0.9% FLUSH
3.0000 mL | Freq: Once | INTRAVENOUS | Status: AC
  Administered 2019-05-05: 3 mL via INTRAVENOUS

## 2019-05-05 NOTE — ED Notes (Signed)
Pt presents to the ED for centralized chest heaviness that he experienced around 0300 this am. Pt states it woke him up out of his sleep and lasted till about 0430 and had another episode around 0830 this AM. Pt states during the episodes he was very diaphoretic. Pt currently have chest heaviness at this time but pt states its not as severe as earlier. Pt also states he has been lightheaded and weak the past couple of weeks and also cannot take a deep breath without a burning sensation in his chest. On assessment, pt A&Ox4 and NAD. Pt NSR on the monitor. Pt has a hx of MI in the past.

## 2019-05-05 NOTE — ED Provider Notes (Signed)
[facility] Emergency Department Provider Note   ____________________________________________    I have reviewed the triage vital signs and the nursing notes.   HISTORY  Chief Complaint Chest Pain     HPI Randy Mclean is a 59 y.o. male with history of hypertension, MI presents with complaints of chest pain.  Patient reports that he woke up at approximately 3:30 AM with pressure-like chest discomfort.  He reports that improved after approximately 1 hour.  Currently he overall feels well but still has some discomfort.  Denies shortness of breath.  No pleurisy.  Does report a history of an MI when he was 59 years old.  Denies fevers chills or cough.  Did take aspirin at home  Past Medical History:  Diagnosis Date  . Acute appendicitis 12/04/2017  . Allergy    takes allergy shots  . Anemia   . Arthritis   . Asthma   . Barrett esophagus   . Bronchitis   . Chronic pain   . Colon polyp   . Depression   . GERD (gastroesophageal reflux disease)   . Hemorrhoid   . Hyperlipidemia   . Hypertension   . Hypothyroid   . IBS (irritable bowel syndrome)   . Kidney stone   . MI (myocardial infarction) ([place])    between 2002 and 2006  . Migraine   . Pneumonia   . Seizures ([place])   . Sinus problem   . Spinal cord stimulator status     Patient Active Problem List   Diagnosis Date Noted  . Cervical disc disorder at C5-C6 level with myelopathy 08/30/2018  . Preoperative clearance 08/30/2018  . Generalized anxiety disorder with panic attacks 08/30/2018  . History of long-term treatment with high-risk medication 08/30/2018  . Narcolepsy due to underlying condition without cataplexy 05/23/2018  . Fever 04/05/2018  . Acute bronchitis with asthma 04/05/2018  . Cough 04/05/2018  . Moderate intermittent asthma with acute exacerbation 04/05/2018  . Cutaneous candidiasis 04/05/2018  . Nonintractable headache 04/05/2018  . Inflammatory polyarthritis  ([place]) 07/27/2017  . Other fatigue 07/27/2017  . Vitamin B12 deficiency 07/27/2017  . Impaired fasting glucose 07/27/2017  . Attention and concentration deficit 07/27/2017  . Iron deficiency anemia 09/17/2014  . Angiolipoma of skin 02/22/2014  . Neuroma of leg 12/27/2012  . Leg mass 11/23/2012  . Chest pain 05/01/2012  . Testicular hypofunction 12/22/2011  . COPD (chronic obstructive pulmonary disease) ([place]) 09/07/2011  . Essential hypertension 10/16/2008  . BACK PAIN WITH RADICULOPATHY 06/20/2007  . FATIGUE 10/31/2006  . BENIGN PROSTATIC HYPERTROPHY 09/22/2005  . PROSTATITIS, CHRONIC 09/22/2005  . HYPERGLYCEMIA 09/22/2005  . HYPERLIPIDEMIA 04/22/2004  . GERD 04/22/2004  . CARPAL TUNNEL SYNDROME, BILATERAL 11/23/2003  . LATERAL EPICONDYLITIS, BILATERAL 11/23/2003  . Anxiety state 09/23/1999  . DEPRESSION 02/23/1999  . Hypothyroidism 02/23/1991  . DISORDER, TOBACCO USE 02/22/1978    Past Surgical History:  Procedure Laterality Date  . ANTERIOR CERVICAL DISCECTOMY    . APPENDECTOMY    . BACK SURGERY    . COLONOSCOPY  August 2015  . elbow surgery    . HEMORRHOID SURGERY    . LAPAROSCOPIC APPENDECTOMY N/A 12/04/2017   Procedure: APPENDECTOMY LAPAROSCOPIC;  Surgeon: Vickie Epley, MD;  Location: [facility] ORS;  Service: General;  Laterality: N/A;  . LIPOMA EXCISION Right    leg  . 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 SPINAL CORD STIMULATOR IMPLANT    . UPPER GI ENDOSCOPY  August  2015    Prior to Admission medications   Medication Sig Start Date End Date Taking? Authorizing Provider  albuterol (VENTOLIN HFA) 108 (90 Base) MCG/ACT inhaler Inhale 2 puffs into the lungs every 4 (four) hours as needed for wheezing or shortness of breath. 03/16/19   Ronnell Freshwater, NP  alfuzosin (UROXATRAL) 10 MG 24 hr tablet TAKE 1 TABLET BY MOUTH DAILY 12/11/18   Kendell Bane, NP  ALPRAZolam Duanne Moron) 0.5 MG tablet Take 1 tablet (0.5 mg total) by mouth 3 (three) times daily as  needed. for anxiety 02/14/19   Kendell Bane, NP  amphetamine-dextroamphetamine (ADDERALL) 20 MG tablet Take 1 tablet (20 mg total) by mouth 3 (three) times daily. 04/24/19   Kendell Bane, NP  amphetamine-dextroamphetamine (ADDERALL) 20 MG tablet Take 1 tablet (20 mg total) by mouth in the morning, at noon, and at bedtime. 05/25/19   Kendell Bane, NP  atorvastatin (LIPITOR) 10 MG tablet Take 1 tablet (10 mg total) by mouth daily. 08/28/18   Ronnell Freshwater, NP  azelastine (ASTELIN) 0.1 % nasal spray USE 1 SPRAY NASALLY 2 TIMES A DAY 04/27/19   Scarboro, Audie Clear, NP  buPROPion (WELLBUTRIN XL) 300 MG 24 hr tablet TAKE 1 TABLET BY MOUTH DAILY. GENERIC EQUIVALENT FOR WELLBUTRIL XL. DUE FOR OFFICE VISIT 03/26/19   Kendell Bane, NP  citalopram (CELEXA) 20 MG tablet Take 1 tablet (20 mg total) by mouth daily. 01/30/19   Kendell Bane, NP  clotrimazole-betamethasone (LOTRISONE) cream Apply 1 application topically 2 (two) times daily. 03/28/18   Ronnell Freshwater, NP  esomeprazole (NEXIUM) 40 MG capsule TK ONE C PO  BID UTD 03/15/16   [provider]  l-methylfolate-B6-B12 (METANX) 3-35-2 MG TABS Take 1 tablet by mouth 2 (two) times daily.    [provider]  losartan (COZAAR) 50 MG tablet Take 1 tablet (50 mg total) by mouth daily. 02/12/19   Ronnell Freshwater, NP  meloxicam (MOBIC) 7.5 MG tablet Take 1 tablet (7.5 mg total) by mouth 2 (two) times daily. 02/12/19   Ronnell Freshwater, NP  metoprolol succinate (TOPROL-XL) 25 MG 24 hr tablet TAKE 1 TABLET BY MOUTH DAILY GENERIC EQUIVALENT FOR TOPROL XL 02/12/19   Scarboro, Audie Clear, NP  mometasone-formoterol (DULERA) 100-5 MCG/ACT AERO Inhale 2 puffs into the lungs every 12 (twelve) hours. 01/31/18   Kendell Bane, NP  Multiple Vitamin (MULTI-VITAMINS) TABS Take by mouth.    [provider]  NUCYNTA ER 200 MG TB12 200 mg bid 01/09/16   [provider]  oxyCODONE-acetaminophen (PERCOCET) 7.5-325 MG per tablet Take 1  tablet by mouth every 4 (four) hours as needed for pain.     [provider]  pregabalin (LYRICA) 50 MG capsule Take 100 mg by mouth 3 (three) times daily.     [provider]  senna (SENOKOT) 8.6 MG tablet Take by mouth.    [provider]  SYRINGE-NEEDLE, DISP, 3 ML (MONOJECT 3CC SYR 22GX1") 22G X 1" 3 ML MISC Use as directed. 02/17/15   [provider]  testosterone cypionate (DEPOTESTOSTERONE CYPIONATE) 200 MG/ML injection INJECT 0.75 MLS (75MG  TOTAL) INTO THE MUSCLE EVERY 7 DAYS AS DIRECTED 12/19/18   Ronnell Freshwater, NP  TIROSINT 200 MCG CAPS TAKE 1 CAPSULE BY MOUTH DAILY 12/12/18   Ronnell Freshwater, NP     Allergies Amoxicillin, Penicillin g, Gabapentin, Penicillins, and Adhesive [tape]  Family History  Problem Relation Age of Onset  . Hypertension  Mother   . Lung disease Mother   . Heart attack Father   . Diabetes Father     Social History Social History   Tobacco Use  . Smoking status: Former Smoker    Packs/day: 1.00    Years: 30.00    Pack years: 30.00    Types: Cigarettes    Quit date: 02/22/2005    Years since quitting: 14.2  . Smokeless tobacco: Never Used  Substance Use Topics  . Alcohol use: Yes    Alcohol/week: 0.0 standard drinks    Comment: occasionally  . Drug use: No    Comment: past    Review of Systems  Constitutional: No fever/chills Eyes: No visual changes.  ENT: No sore throat. Cardiovascular: As above Respiratory: Denies shortness of breath. Gastrointestinal: No abdominal pain.   Genitourinary: Negative for dysuria. Musculoskeletal: Negative for back pain. Skin: Negative for rash. Neurological: Negative for headaches    ____________________________________________   PHYSICAL EXAM:  VITAL SIGNS: ED Triage Vitals  Enc Vitals Group     BP 05/05/19 1129 133/60     Pulse Rate 05/05/19 1129 84     Resp 05/05/19 1129 16     Temp 05/05/19 1129 98.8 F (37.1 C)     Temp Source 05/05/19 1129 Oral       SpO2 05/05/19 1129 97 %     Weight 05/05/19 1127 98 kg (216 lb)     Height 05/05/19 1127 1.829 m (6')     Head Circumference --      Peak Flow --      Pain Score 05/05/19 1127 4     Pain Loc --      Pain Edu? --      Excl. in [place]? --     Constitutional: Alert and oriented.   Nose: No congestion/rhinnorhea. Mouth/Throat: Mucous membranes are moist.   Neck:  Painless ROM Cardiovascular: Normal rate, regular rhythm. Grossly normal heart sounds.  Good peripheral circulation. Respiratory: Normal respiratory effort.  No retractions. Lungs CTAB. Gastrointestinal: Soft and nontender. No distention.  No CVA tenderness.  Musculoskeletal: No lower extremity tenderness nor edema.  Warm and well perfused Neurologic:  Normal speech and language. No gross focal neurologic deficits are appreciated.  Skin:  Skin is warm, dry and intact. No rash noted. Psychiatric: Mood and affect are normal. Speech and behavior are normal.  ____________________________________________   LABS (all labs ordered are listed, but only abnormal results are displayed)  Labs Reviewed  BASIC METABOLIC PANEL - Abnormal; Notable for the following components:      Result Value   Glucose, Bld 112 (*)    Calcium 8.4 (*)    All other components within normal limits  CBC - Abnormal; Notable for the following components:   Hemoglobin 9.7 (*)    HCT 33.1 (*)    MCV 74.2 (*)    MCH 21.7 (*)    MCHC 29.3 (*)    RDW 17.6 (*)    All other components within normal limits  TROPONIN I (HIGH SENSITIVITY)  TROPONIN I (HIGH SENSITIVITY)   ____________________________________________  EKG  ED ECG REPORT I, Lavonia Drafts, the attending physician, personally viewed and interpreted this ECG.  Date: 05/05/2019  Rhythm: normal sinus rhythm QRS Axis: normal Intervals: normal ST/T Wave abnormalities: normal Narrative Interpretation: no evidence of acute  ischemia  ____________________________________________  RADIOLOGY  Chest x-ray unremarkable ____________________________________________   PROCEDURES  Procedure(s) performed:yes Patient being evaluated for chest pain and placed on cardiac monitor for  evaluation for possible arrhythmia .1-3 Lead EKG Interpretation Performed by: Lavonia Drafts, MD Authorized by: Lavonia Drafts, MD     Interpretation: normal     ECG rate:  77   ECG rate assessment: normal     Rhythm: sinus rhythm     Ectopy: none     Conduction: normal       Critical Care performed: No ____________________________________________   INITIAL IMPRESSION / ASSESSMENT AND PLAN / ED COURSE  Pertinent labs & imaging results that were available during my care of the patient were reviewed by me and considered in my medical decision making (see chart for details).  Patient overall well-appearing in no acute distress, EKG is reassuring, which makes ACS less likely however given his history troponins are pending.  We will give aspirin, nitro glycerin sublingual, obtain cardiac enzymes, labs, chest x-ray and monitor carefully.  ----------------------------------------- 2:48 PM on 05/05/2019 ----------------------------------------- Patient with normal delta troponin, and is asymptomatic at this time.  Discussed option for admission for observation or discharge home with close follow-up with cardiology, he is opted for close follow-up with cardiology.  He knows to return if any return of his symptoms      ____________________________________________   FINAL CLINICAL IMPRESSION(S) / ED DIAGNOSES  Final diagnoses:  Nonspecific chest pain        Note:  This document was prepared using Dragon voice recognition software and may include unintentional dictation errors.   Lavonia Drafts, MD 05/05/19 [phone]

## 2019-05-05 NOTE — ED Triage Notes (Signed)
Pt to ED via POV c/o chest pain around 0300. Pt states that the pain woke him from sleep. Pt reports that it lasted for approximately 2 hours. Pt states that he is still having some chest pain but not as severe as earlier. Pt also reports that for the past 2 weeks, when he takes a deep breathe in he gets burning sensation in his chest. Pt is in NAD at this time. Pt has hx/o MI when he was in his 30's.

## 2019-05-07 ENCOUNTER — Other Ambulatory Visit: Payer: Self-pay

## 2019-05-07 MED ORDER — LOSARTAN POTASSIUM 50 MG PO TABS: 50.0000 mg | ORAL_TABLET | Freq: Every day | ORAL | 1 refills | Status: DC

## 2019-05-08 DIAGNOSIS — R5383 Other fatigue: Secondary | ICD-10-CM | POA: Diagnosis not present

## 2019-05-10 ENCOUNTER — Telehealth: Payer: Self-pay

## 2019-05-10 NOTE — Telephone Encounter (Signed)
Confirmed appointment on 05/14/2019 and screened for covid. klh 

## 2019-05-11 LAB — PROLACTIN: Prolactin: 14.7 ng/mL (ref 4.0–15.2)

## 2019-05-11 LAB — TESTOSTERONE,FREE AND TOTAL
Testosterone, Free: 9.7 pg/mL (ref 7.2–24.0)
Testosterone: 635 ng/dL (ref 264–916)

## 2019-05-11 LAB — IRON,TIBC AND FERRITIN PANEL
Ferritin: 7 ng/mL — ABNORMAL LOW (ref 30–400)
Iron Saturation: 4 % — CL (ref 15–55)
Iron: 15 ug/dL — ABNORMAL LOW (ref 38–169)
Total Iron Binding Capacity: 406 ug/dL (ref 250–450)
UIBC: 391 ug/dL — ABNORMAL HIGH (ref 111–343)

## 2019-05-11 LAB — B12 AND FOLATE PANEL
Folate: 8.7 ng/mL (ref >3.0)
Vitamin B-12: 637 pg/mL (ref 232–1245)

## 2019-05-11 LAB — VITAMIN D 25 HYDROXY (VIT D DEFICIENCY, FRACTURES): Vit D, 25-Hydroxy: 17.9 ng/mL — ABNORMAL LOW (ref 30.0–100.0)

## 2019-05-14 ENCOUNTER — Other Ambulatory Visit: Payer: Self-pay

## 2019-05-14 ENCOUNTER — Encounter: Payer: Self-pay | Admitting: Adult Health

## 2019-05-14 ENCOUNTER — Ambulatory Visit: Payer: BC Managed Care – PPO | Admitting: Adult Health

## 2019-05-14 VITALS — BP 154/64 | HR 75 | Temp 97.4°F | Resp 16 | Ht 72.0 in | Wt 213.6 lb

## 2019-05-14 DIAGNOSIS — R011 Cardiac murmur, unspecified: Secondary | ICD-10-CM | POA: Diagnosis not present

## 2019-05-14 DIAGNOSIS — F410 Panic disorder [episodic paroxysmal anxiety] without agoraphobia: Secondary | ICD-10-CM

## 2019-05-14 DIAGNOSIS — F411 Generalized anxiety disorder: Secondary | ICD-10-CM

## 2019-05-14 DIAGNOSIS — F339 Major depressive disorder, recurrent, unspecified: Secondary | ICD-10-CM | POA: Diagnosis not present

## 2019-05-14 DIAGNOSIS — E1165 Type 2 diabetes mellitus with hyperglycemia: Secondary | ICD-10-CM

## 2019-05-14 DIAGNOSIS — E785 Hyperlipidemia, unspecified: Secondary | ICD-10-CM

## 2019-05-14 DIAGNOSIS — E559 Vitamin D deficiency, unspecified: Secondary | ICD-10-CM

## 2019-05-14 DIAGNOSIS — I10 Essential (primary) hypertension: Secondary | ICD-10-CM | POA: Diagnosis not present

## 2019-05-14 LAB — POCT GLYCOSYLATED HEMOGLOBIN (HGB A1C): Hemoglobin A1C: 5.3 % (ref 4.0–5.6)

## 2019-05-14 MED ORDER — ALPRAZOLAM 0.5 MG PO TABS: 0.5000 mg | ORAL_TABLET | Freq: Three times a day (TID) | ORAL | 2 refills | Status: DC | PRN

## 2019-05-14 MED ORDER — VITAMIN D (ERGOCALCIFEROL) 1.25 MG (50000 UNIT) PO CAPS: 50000.0000 [IU] | ORAL_CAPSULE | ORAL | 0 refills | Status: DC

## 2019-05-14 NOTE — Progress Notes (Signed)
[facility] [place],  [address]  Internal MEDICINE  Office Visit Note  Patient Name: Randy Mclean  [id number]  [id number]  Date of Service: 05/14/2019  Chief Complaint  Patient presents with  . Follow-up    labs   . Depression  . Gastroesophageal Reflux  . Hyperlipidemia  . Hypertension    HPI  PT is here for follow up on depression, GERD, HTN, HLD. Overall he is doing well.  His blood pressure is mostly controlled.  Denies Chest pain, Shortness of breath, palpitations, headache, or blurred vision. His lipid panel is WNL at last draw.  He denies any current symptoms. He reports he went to the ER on 05/05/19 after being woken up by chest pain.  He was worked up in the ED and nothing was found at that time.  He did hav a Heart murmur and was given follow up with cardiology.          Current Medication: Outpatient Encounter Medications as of 05/14/2019  Medication Sig  . albuterol (VENTOLIN HFA) 108 (90 Base) MCG/ACT inhaler Inhale 2 puffs into the lungs every 4 (four) hours as needed for wheezing or shortness of breath.  . alfuzosin (UROXATRAL) 10 MG 24 hr tablet TAKE 1 TABLET BY MOUTH DAILY  . ALPRAZolam (XANAX) 0.5 MG tablet Take 1 tablet (0.5 mg total) by mouth 3 (three) times daily as needed. for anxiety  . amphetamine-dextroamphetamine (ADDERALL) 20 MG tablet Take 1 tablet (20 mg total) by mouth 3 (three) times daily.  Derrill Memo ON 05/25/2019] amphetamine-dextroamphetamine (ADDERALL) 20 MG tablet Take 1 tablet (20 mg total) by mouth in the morning, at noon, and at bedtime.  Marland Kitchen atorvastatin (LIPITOR) 10 MG tablet Take 1 tablet (10 mg total) by mouth daily.  Marland Kitchen azelastine (ASTELIN) 0.1 % nasal spray USE 1 SPRAY NASALLY 2 TIMES A DAY  . buPROPion (WELLBUTRIN XL) 300 MG 24 hr tablet TAKE 1 TABLET BY MOUTH DAILY. GENERIC EQUIVALENT FOR WELLBUTRIL XL. DUE FOR OFFICE VISIT  . citalopram (CELEXA) 20 MG tablet Take 1 tablet (20 mg total) by mouth  daily.  . clotrimazole-betamethasone (LOTRISONE) cream Apply 1 application topically 2 (two) times daily.  Marland Kitchen esomeprazole (NEXIUM) 40 MG capsule TK ONE C PO  BID UTD  . l-methylfolate-B6-B12 (METANX) 3-35-2 MG TABS Take 1 tablet by mouth 2 (two) times daily.  Marland Kitchen losartan (COZAAR) 50 MG tablet Take 1 tablet (50 mg total) by mouth daily.  . meloxicam (MOBIC) 7.5 MG tablet Take 1 tablet (7.5 mg total) by mouth 2 (two) times daily.  . metoprolol succinate (TOPROL-XL) 25 MG 24 hr tablet TAKE 1 TABLET BY MOUTH DAILY GENERIC EQUIVALENT FOR TOPROL XL  . mometasone-formoterol (DULERA) 100-5 MCG/ACT AERO Inhale 2 puffs into the lungs every 12 (twelve) hours.  . Multiple Vitamin (MULTI-VITAMINS) TABS Take by mouth.  Gean Birchwood ER 200 MG TB12 200 mg bid  . oxyCODONE-acetaminophen (PERCOCET) 7.5-325 MG per tablet Take 1 tablet by mouth every 4 (four) hours as needed for pain.   . pregabalin (LYRICA) 50 MG capsule Take 100 mg by mouth 3 (three) times daily.   Marland Kitchen senna (SENOKOT) 8.6 MG tablet Take by mouth.  . SYRINGE-NEEDLE, DISP, 3 ML (MONOJECT 3CC SYR 22GX1") 22G X 1" 3 ML MISC Use as directed.  . testosterone cypionate (DEPOTESTOSTERONE CYPIONATE) 200 MG/ML injection INJECT 0.75 MLS (75MG  TOTAL) INTO THE MUSCLE EVERY 7 DAYS AS DIRECTED  . TIROSINT 200 MCG CAPS TAKE 1 CAPSULE BY  MOUTH DAILY  . [DISCONTINUED] ALPRAZolam (XANAX) 0.5 MG tablet Take 1 tablet (0.5 mg total) by mouth 3 (three) times daily as needed. for anxiety  . Vitamin D, Ergocalciferol, (DRISDOL) 1.25 MG (50000 UNIT) CAPS capsule Take 1 capsule (50,000 Units total) by mouth every 7 (seven) days.   No facility-administered encounter medications on file as of 05/14/2019.    Surgical History: Past Surgical History:  Procedure Laterality Date  . ANTERIOR CERVICAL DISCECTOMY    . APPENDECTOMY    . BACK SURGERY    . COLONOSCOPY  August 2015  . elbow surgery    . HEMORRHOID SURGERY    . LAPAROSCOPIC APPENDECTOMY N/A 12/04/2017   Procedure:  APPENDECTOMY LAPAROSCOPIC;  Surgeon: Vickie Epley, MD;  Location: [facility] ORS;  Service: General;  Laterality: N/A;  . LIPOMA EXCISION Right    leg  . 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 SPINAL CORD STIMULATOR IMPLANT    . UPPER GI ENDOSCOPY  August 2015    Medical History: Past Medical History:  Diagnosis Date  . Acute appendicitis 12/04/2017  . Allergy    takes allergy shots  . Anemia   . Arthritis   . Asthma   . Barrett esophagus   . Bronchitis   . Chronic pain   . Colon polyp   . Depression   . GERD (gastroesophageal reflux disease)   . Hemorrhoid   . Hyperlipidemia   . Hypertension   . Hypothyroid   . IBS (irritable bowel syndrome)   . Kidney stone   . MI (myocardial infarction) ([place])    between 2002 and 2006  . Migraine   . Pneumonia   . Seizures ([place])   . Sinus problem   . Spinal cord stimulator status     Family History: Family History  Problem Relation Age of Onset  . Hypertension Mother   . Lung disease Mother   . Heart attack Father   . Diabetes Father     Social History   Socioeconomic History  . Marital status: Married    Spouse name: Not on file  . Number of children: Not on file  . Years of education: Not on file  . Highest education level: Not on file  Occupational History  . Not on file  Tobacco Use  . Smoking status: Former Smoker    Packs/day: 1.00    Years: 30.00    Pack years: 30.00    Types: Cigarettes    Quit date: 02/22/2005    Years since quitting: 14.2  . Smokeless tobacco: Never Used  Substance and Sexual Activity  . Alcohol use: Yes    Alcohol/week: 0.0 standard drinks    Comment: occasionally  . Drug use: No    Comment: past  . Sexual activity: Yes  Other Topics Concern  . Not on file  Social History Narrative  . Not on file   Social Determinants of Health   Financial Resource Strain:   . Difficulty of Paying Living Expenses:   Food Insecurity:   . Worried About Charity fundraiser in the Last  Year:   . Arboriculturist in the Last Year:   Transportation Needs:   . Film/video editor (Medical):   Marland Kitchen Lack of Transportation (Non-Medical):   Physical Activity:   . Days of Exercise per Week:   . Minutes of Exercise per Session:   Stress:   . Feeling of Stress :   Social Connections:   .  Frequency of Communication with Friends and Family:   . Frequency of Social Gatherings with Friends and Family:   . Attends Religious Services:   . Active Member of Clubs or Organizations:   . Attends Archivist Meetings:   Marland Kitchen Marital Status:   Intimate Partner Violence:   . Fear of Current or Ex-Partner:   . Emotionally Abused:   Marland Kitchen Physically Abused:   . Sexually Abused:       Review of Systems  Constitutional: Negative.  Negative for chills, fatigue and unexpected weight change.  HENT: Negative.  Negative for congestion, rhinorrhea, sneezing and sore throat.   Eyes: Negative for redness.  Respiratory: Negative.  Negative for cough, chest tightness and shortness of breath.   Cardiovascular: Negative.  Negative for chest pain and palpitations.  Gastrointestinal: Negative.  Negative for abdominal pain, constipation, diarrhea, nausea and vomiting.  Endocrine: Negative.   Genitourinary: Negative.  Negative for dysuria and frequency.  Musculoskeletal: Negative.  Negative for arthralgias, back pain, joint swelling and neck pain.  Skin: Negative.  Negative for rash.  Allergic/Immunologic: Negative.   Neurological: Negative.  Negative for tremors and numbness.  Hematological: Negative for adenopathy. Does not bruise/bleed easily.  Psychiatric/Behavioral: Negative.  Negative for behavioral problems, sleep disturbance and suicidal ideas. The patient is not nervous/anxious.     Vital Signs: BP (!) 154/64   Pulse 75   Temp (!) 97.4 F (36.3 C)   Resp 16   Ht 6' (1.829 m)   Wt 213 lb 9.6 oz (96.9 kg)   SpO2 94%   BMI 28.97 kg/m    Physical Exam Vitals and nursing note  reviewed.  Constitutional:      General: He is not in acute distress.    Appearance: He is well-developed. He is not diaphoretic.  HENT:     Head: Normocephalic and atraumatic.     Mouth/Throat:     Pharynx: No oropharyngeal exudate.  Eyes:     Pupils: Pupils are equal, round, and reactive to light.  Neck:     Thyroid: No thyromegaly.     Vascular: No JVD.     Trachea: No tracheal deviation.  Cardiovascular:     Rate and Rhythm: Normal rate and regular rhythm.     Heart sounds: Normal heart sounds. No murmur. No friction rub. No gallop.   Pulmonary:     Effort: Pulmonary effort is normal. No respiratory distress.     Breath sounds: Normal breath sounds. No wheezing or rales.  Chest:     Chest wall: No tenderness.  Abdominal:     Palpations: Abdomen is soft.     Tenderness: There is no abdominal tenderness. There is no guarding.  Musculoskeletal:        General: Normal range of motion.     Cervical back: Normal range of motion and neck supple.  Lymphadenopathy:     Cervical: No cervical adenopathy.  Skin:    General: Skin is warm and dry.  Neurological:     Mental Status: He is alert and oriented to person, place, and time.     Cranial Nerves: No cranial nerve deficit.  Psychiatric:        Behavior: Behavior normal.        Thought Content: Thought content normal.        Judgment: Judgment normal.    Assessment/Plan:  1. Essential hypertension Continue current medications.   2. Heart murmur on physical examination Echo for evaluation. - ECHOCARDIOGRAM COMPLETE; Future  3. Vitamin D deficiency Take vit d as discussed.  - Vitamin D, Ergocalciferol, (DRISDOL) 1.25 MG (50000 UNIT) CAPS capsule; Take 1 capsule (50,000 Units total) by mouth every 7 (seven) days.  Dispense: 10 capsule; Refill: 0  4. Depression, recurrent ([place]) Stable, continue current medications.   5. Hyperlipidemia, unspecified hyperlipidemia type Last lipid panel WNL.  Continue to monitor, and  continue statin.  6. Uncontrolled type 2 diabetes mellitus with hyperglycemia (HCC) A1C was normal 5.3.  - POCT glycosylated hemoglobin (Hb A1C)   General Counseling: Randy Mclean verbalizes understanding of the findings of todays visit and agrees with plan of treatment. I have discussed any further diagnostic evaluation that may be needed or ordered today. We also reviewed his medications today. he has been encouraged to call the office with any questions or concerns that should arise related to todays visit.    Orders Placed This Encounter  Procedures  . POCT glycosylated hemoglobin (Hb A1C)  . ECHOCARDIOGRAM COMPLETE    Meds ordered this encounter  Medications  . Vitamin D, Ergocalciferol, (DRISDOL) 1.25 MG (50000 UNIT) CAPS capsule    Sig: Take 1 capsule (50,000 Units total) by mouth every 7 (seven) days.    Dispense:  10 capsule    Refill:  0  . ALPRAZolam (XANAX) 0.5 MG tablet    Sig: Take 1 tablet (0.5 mg total) by mouth 3 (three) times daily as needed. for anxiety    Dispense:  90 tablet    Refill:  2    Time spent: 25 Minutes   This patient was seen by Orson Gear AGNP-C in Collaboration with Dr Lavera Guise as a part of collaborative care agreement     Kendell Bane AGNP-C Internal medicine

## 2019-05-15 ENCOUNTER — Other Ambulatory Visit: Payer: Self-pay | Admitting: Adult Health

## 2019-05-15 DIAGNOSIS — F339 Major depressive disorder, recurrent, unspecified: Secondary | ICD-10-CM

## 2019-05-18 ENCOUNTER — Ambulatory Visit: Payer: BC Managed Care – PPO | Attending: Internal Medicine

## 2019-05-18 DIAGNOSIS — I208 Other forms of angina pectoris: Secondary | ICD-10-CM | POA: Insufficient documentation

## 2019-05-18 DIAGNOSIS — E782 Mixed hyperlipidemia: Secondary | ICD-10-CM | POA: Diagnosis not present

## 2019-05-18 DIAGNOSIS — I2089 Other forms of angina pectoris: Secondary | ICD-10-CM | POA: Insufficient documentation

## 2019-05-18 DIAGNOSIS — Z23 Encounter for immunization: Secondary | ICD-10-CM

## 2019-05-18 DIAGNOSIS — I6523 Occlusion and stenosis of bilateral carotid arteries: Secondary | ICD-10-CM | POA: Insufficient documentation

## 2019-05-18 DIAGNOSIS — I10 Essential (primary) hypertension: Secondary | ICD-10-CM | POA: Diagnosis not present

## 2019-05-18 NOTE — Progress Notes (Signed)
   Covid-19 Vaccination Clinic  Name:  Randy Mclean    MRN: [id number] DOB: Aug 06, 1960  05/18/2019  Mr. [name] was observed post Covid-19 immunization for 15 minutes without incident. He was provided with Vaccine Information Sheet and instruction to access the V-Safe system.   Mr. [name] was instructed to call 911 with any severe reactions post vaccine: Marland Kitchen Difficulty breathing  . Swelling of face and throat  . A fast heartbeat  . A bad rash all over body  . Dizziness and weakness   Immunizations Administered    Name Date Dose VIS Date Route   Pfizer COVID-19 Vaccine 05/18/2019  1:54 PM 0.3 mL 02/02/2019 Intramuscular   Manufacturer: [place]   Lot: [id number]   [place]: [id number]

## 2019-05-22 ENCOUNTER — Other Ambulatory Visit: Payer: Self-pay

## 2019-05-22 MED ORDER — DULERA 100-5 MCG/ACT IN AERO: INHALATION_SPRAY | RESPIRATORY_TRACT | 3 refills | Status: DC

## 2019-05-24 DIAGNOSIS — G5602 Carpal tunnel syndrome, left upper limb: Secondary | ICD-10-CM | POA: Diagnosis not present

## 2019-05-24 DIAGNOSIS — M1812 Unilateral primary osteoarthritis of first carpometacarpal joint, left hand: Secondary | ICD-10-CM | POA: Diagnosis not present

## 2019-05-30 ENCOUNTER — Telehealth: Payer: Self-pay

## 2019-05-30 NOTE — Telephone Encounter (Signed)
Called lmom informing patient of appointment on 06/01/2019. klh

## 2019-05-31 ENCOUNTER — Telehealth: Payer: Self-pay

## 2019-05-31 NOTE — Telephone Encounter (Signed)
Patient cancelled echo on 06/01/2019 cardiologist told to cancel having stress test done. klh

## 2019-06-01 ENCOUNTER — Other Ambulatory Visit: Payer: BC Managed Care – PPO

## 2019-06-07 ENCOUNTER — Telehealth: Payer: Self-pay

## 2019-06-07 NOTE — Telephone Encounter (Signed)
Called lmom informing patient of appointment on 06/11/2019.klh 

## 2019-06-11 ENCOUNTER — Ambulatory Visit (INDEPENDENT_AMBULATORY_CARE_PROVIDER_SITE_OTHER): Payer: BC Managed Care – PPO | Admitting: Adult Health

## 2019-06-11 ENCOUNTER — Encounter: Payer: Self-pay | Admitting: Adult Health

## 2019-06-11 ENCOUNTER — Other Ambulatory Visit: Payer: Self-pay

## 2019-06-11 VITALS — BP 120/80 | HR 77 | Temp 97.3°F | Resp 16 | Ht 72.0 in | Wt 211.0 lb

## 2019-06-11 DIAGNOSIS — G47429 Narcolepsy in conditions classified elsewhere without cataplexy: Secondary | ICD-10-CM

## 2019-06-11 DIAGNOSIS — D508 Other iron deficiency anemias: Secondary | ICD-10-CM

## 2019-06-11 DIAGNOSIS — E785 Hyperlipidemia, unspecified: Secondary | ICD-10-CM

## 2019-06-11 DIAGNOSIS — E559 Vitamin D deficiency, unspecified: Secondary | ICD-10-CM | POA: Diagnosis not present

## 2019-06-11 DIAGNOSIS — I6523 Occlusion and stenosis of bilateral carotid arteries: Secondary | ICD-10-CM | POA: Diagnosis not present

## 2019-06-11 DIAGNOSIS — I10 Essential (primary) hypertension: Secondary | ICD-10-CM | POA: Diagnosis not present

## 2019-06-11 DIAGNOSIS — F339 Major depressive disorder, recurrent, unspecified: Secondary | ICD-10-CM

## 2019-06-11 DIAGNOSIS — I208 Other forms of angina pectoris: Secondary | ICD-10-CM | POA: Diagnosis not present

## 2019-06-11 MED ORDER — AMPHETAMINE-DEXTROAMPHETAMINE 20 MG PO TABS: 20.0000 mg | ORAL_TABLET | Freq: Three times a day (TID) | ORAL | 0 refills | Status: DC

## 2019-06-11 NOTE — Progress Notes (Signed)
[facility] [place], [place] [address]  Internal MEDICINE  Office Visit Note  Patient Name: Randy Mclean Younger  [id number]  [id number]  Date of Service: 06/11/2019  Chief Complaint  Patient presents with  . Depression  . Hypertension  . Hyperlipidemia  . Anxiety    HPI  PT is here for follow up on Depression , HTN, HLD and anxiety.  Overall he is doing well. He reports he had a stress tess, Echo and carotid US today through cardiology.  He follows up in one week for those results.  He Denies Chest pain, Shortness of breath, palpitations, headache, or blurred vision. His BP today is 120/80.     Current Medication: Outpatient Encounter Medications as of 06/11/2019  Medication Sig  . albuterol (VENTOLIN HFA) 108 (90 Base) MCG/ACT inhaler Inhale 2 puffs into the lungs every 4 (four) hours as needed for wheezing or shortness of breath.  . alfuzosin (UROXATRAL) 10 MG 24 hr tablet TAKE 1 TABLET BY MOUTH DAILY  . ALPRAZolam (XANAX) 0.5 MG tablet Take 1 tablet (0.5 mg total) by mouth 3 (three) times daily as needed. for anxiety  . [START ON 06/24/2019] amphetamine-dextroamphetamine (ADDERALL) 20 MG tablet Take 1 tablet (20 mg total) by mouth in the morning, at noon, and at bedtime.  Derrill Memo ON 07/24/2019] amphetamine-dextroamphetamine (ADDERALL) 20 MG tablet Take 1 tablet (20 mg total) by mouth 3 (three) times daily.  Marland Kitchen atorvastatin (LIPITOR) 10 MG tablet Take 1 tablet (10 mg total) by mouth daily.  Marland Kitchen azelastine (ASTELIN) 0.1 % nasal spray USE 1 SPRAY NASALLY 2 TIMES A DAY  . buPROPion (WELLBUTRIN XL) 300 MG 24 hr tablet TAKE 1 TABLET BY MOUTH DAILY. GENERIC EQUIVALENT FOR WELLBUTRIL XL. DUE FOR OFFICE VISIT  . citalopram (CELEXA) 20 MG tablet TAKE 1 TABLET BY MOUTH DAILY  . clotrimazole-betamethasone (LOTRISONE) cream Apply 1 application topically 2 (two) times daily.  Marland Kitchen esomeprazole (NEXIUM) 40 MG capsule TK ONE C PO  BID UTD  . l-methylfolate-B6-B12 (METANX)  3-35-2 MG TABS Take 1 tablet by mouth 2 (two) times daily.  Marland Kitchen losartan (COZAAR) 50 MG tablet Take 1 tablet (50 mg total) by mouth daily.  . meloxicam (MOBIC) 7.5 MG tablet Take 1 tablet (7.5 mg total) by mouth 2 (two) times daily.  . metoprolol succinate (TOPROL-XL) 25 MG 24 hr tablet TAKE 1 TABLET BY MOUTH DAILY GENERIC EQUIVALENT FOR TOPROL XL  . mometasone-formoterol (DULERA) 100-5 MCG/ACT AERO INHALE 2 PUFFS BY MOUTH INTO THE LUNGS EVERY 12 HOURS  . Multiple Vitamin (MULTI-VITAMINS) TABS Take by mouth.  Gean Birchwood ER 200 MG TB12 200 mg bid  . oxyCODONE-acetaminophen (PERCOCET) 7.5-325 MG per tablet Take 1 tablet by mouth every 4 (four) hours as needed for pain.   . pregabalin (LYRICA) 50 MG capsule Take 100 mg by mouth 3 (three) times daily.   Marland Kitchen senna (SENOKOT) 8.6 MG tablet Take by mouth.  . SYRINGE-NEEDLE, DISP, 3 ML (MONOJECT 3CC SYR 22GX1") 22G X 1" 3 ML MISC Use as directed.  . testosterone cypionate (DEPOTESTOSTERONE CYPIONATE) 200 MG/ML injection INJECT 0.75 MLS (75MG  TOTAL) INTO THE MUSCLE EVERY 7 DAYS AS DIRECTED  . TIROSINT 200 MCG CAPS TAKE 1 CAPSULE BY MOUTH DAILY  . Vitamin D, Ergocalciferol, (DRISDOL) 1.25 MG (50000 UNIT) CAPS capsule Take 1 capsule (50,000 Units total) by mouth every 7 (seven) days.  . [DISCONTINUED] amphetamine-dextroamphetamine (ADDERALL) 20 MG tablet Take 1 tablet (20 mg total) by mouth 3 (three) times daily.  . [  DISCONTINUED] amphetamine-dextroamphetamine (ADDERALL) 20 MG tablet Take 1 tablet (20 mg total) by mouth in the morning, at noon, and at bedtime.   No facility-administered encounter medications on file as of 06/11/2019.    Surgical History: Past Surgical History:  Procedure Laterality Date  . ANTERIOR CERVICAL DISCECTOMY    . APPENDECTOMY    . BACK SURGERY    . COLONOSCOPY  August 2015  . elbow surgery    . HEMORRHOID SURGERY    . LAPAROSCOPIC APPENDECTOMY N/A 12/04/2017   Procedure: APPENDECTOMY LAPAROSCOPIC;  Surgeon: Vickie Epley,  MD;  Location: [facility] ORS;  Service: General;  Laterality: N/A;  . LIPOMA EXCISION Right    leg  . 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 SPINAL CORD STIMULATOR IMPLANT    . UPPER GI ENDOSCOPY  August 2015    Medical History: Past Medical History:  Diagnosis Date  . Acute appendicitis 12/04/2017  . Allergy    takes allergy shots  . Anemia   . Arthritis   . Asthma   . Barrett esophagus   . Bronchitis   . Chronic pain   . Colon polyp   . Depression   . GERD (gastroesophageal reflux disease)   . Hemorrhoid   . Hyperlipidemia   . Hypertension   . Hypothyroid   . IBS (irritable bowel syndrome)   . Kidney stone   . MI (myocardial infarction) ([place])    between 2002 and 2006  . Migraine   . Pneumonia   . Seizures ([place])   . Sinus problem   . Spinal cord stimulator status     Family History: Family History  Problem Relation Age of Onset  . Hypertension Mother   . Lung disease Mother   . Heart attack Father   . Diabetes Father     Social History   Socioeconomic History  . Marital status: Married    Spouse name: Not on file  . Number of children: Not on file  . Years of education: Not on file  . Highest education level: Not on file  Occupational History  . Not on file  Tobacco Use  . Smoking status: Former Smoker    Packs/day: 1.00    Years: 30.00    Pack years: 30.00    Types: Cigarettes    Quit date: 02/22/2005    Years since quitting: 14.3  . Smokeless tobacco: Never Used  Substance and Sexual Activity  . Alcohol use: Yes    Alcohol/week: 0.0 standard drinks    Comment: occasionally  . Drug use: No    Comment: past  . Sexual activity: Yes  Other Topics Concern  . Not on file  Social History Narrative  . Not on file   Social Determinants of Health   Financial Resource Strain:   . Difficulty of Paying Living Expenses:   Food Insecurity:   . Worried About Charity fundraiser in the Last Year:   . Arboriculturist in the Last Year:    Transportation Needs:   . Film/video editor (Medical):   Marland Kitchen Lack of Transportation (Non-Medical):   Physical Activity:   . Days of Exercise per Week:   . Minutes of Exercise per Session:   Stress:   . Feeling of Stress :   Social Connections:   . Frequency of Communication with Friends and Family:   . Frequency of Social Gatherings with Friends and Family:   . Attends Religious Services:   .  Active Member of Clubs or Organizations:   . Attends Archivist Meetings:   Marland Kitchen Marital Status:   Intimate Partner Violence:   . Fear of Current or Ex-Partner:   . Emotionally Abused:   Marland Kitchen Physically Abused:   . Sexually Abused:       Review of Systems  Constitutional: Negative.  Negative for chills, fatigue and unexpected weight change.  HENT: Negative.  Negative for congestion, rhinorrhea, sneezing and sore throat.   Eyes: Negative for redness.  Respiratory: Negative.  Negative for cough, chest tightness and shortness of breath.   Cardiovascular: Negative.  Negative for chest pain and palpitations.  Gastrointestinal: Negative.  Negative for abdominal pain, constipation, diarrhea, nausea and vomiting.  Endocrine: Negative.   Genitourinary: Negative.  Negative for dysuria and frequency.  Musculoskeletal: Negative.  Negative for arthralgias, back pain, joint swelling and neck pain.  Skin: Negative.  Negative for rash.  Allergic/Immunologic: Negative.   Neurological: Negative.  Negative for tremors and numbness.  Hematological: Negative for adenopathy. Does not bruise/bleed easily.  Psychiatric/Behavioral: Negative.  Negative for behavioral problems, sleep disturbance and suicidal ideas. The patient is not nervous/anxious.     Vital Signs: BP 120/80   Pulse 77   Temp (!) 97.3 F (36.3 C)   Resp 16   Ht 6' (1.829 m)   Wt 211 lb (95.7 kg)   SpO2 96%   BMI 28.62 kg/m    Physical Exam Vitals and nursing note reviewed.  Constitutional:      General: He is not in  acute distress.    Appearance: He is well-developed. He is not diaphoretic.  HENT:     Head: Normocephalic and atraumatic.     Mouth/Throat:     Pharynx: No oropharyngeal exudate.  Eyes:     Pupils: Pupils are equal, round, and reactive to light.  Neck:     Thyroid: No thyromegaly.     Vascular: No JVD.     Trachea: No tracheal deviation.  Cardiovascular:     Rate and Rhythm: Normal rate and regular rhythm.     Heart sounds: Normal heart sounds. No murmur. No friction rub. No gallop.   Pulmonary:     Effort: Pulmonary effort is normal. No respiratory distress.     Breath sounds: Normal breath sounds. No wheezing or rales.  Chest:     Chest wall: No tenderness.  Abdominal:     Palpations: Abdomen is soft.     Tenderness: There is no abdominal tenderness. There is no guarding.  Musculoskeletal:        General: Normal range of motion.     Cervical back: Normal range of motion and neck supple.  Lymphadenopathy:     Cervical: No cervical adenopathy.  Skin:    General: Skin is warm and dry.  Neurological:     Mental Status: He is alert and oriented to person, place, and time.     Cranial Nerves: No cranial nerve deficit.  Psychiatric:        Behavior: Behavior normal.        Thought Content: Thought content normal.        Judgment: Judgment normal.    Assessment/Plan: 1. 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 Reviewed risks and possible side effects associated with taking Stimulants. Combination of these drugs with other psychotropic medications could cause dizziness and drowsiness. Pt needs to Monitor symptoms and exercise caution in driving and operating heavy machinery to avoid damages to  oneself, to others and to the surroundings. Patient verbalized understanding in this matter. Dependence and abuse for these drugs will be monitored closely. A Controlled substance policy and procedure is on file which allows [facility] to order a urine drug screen test at any visit. Patient understands and agrees with the plan.. - amphetamine-dextroamphetamine (ADDERALL) 20 MG tablet; Take 1 tablet (20 mg total) by mouth in the morning, at noon, and at bedtime.  Dispense: 90 tablet; Refill: 0 - amphetamine-dextroamphetamine (ADDERALL) 20 MG tablet; Take 1 tablet (20 mg total) by mouth 3 (three) times daily.  Dispense: 90 tablet; Refill: 0  2. Vitamin D deficiency Continue Vit D as discussed.   3. Other iron deficiency anemia Referral to reestablish care with Dr. Grayland Ormond.  - Ambulatory referral to Hematology  4. Essential hypertension Controlled, continue current medications, and follow up with cardiology as discussed.   5. Depression, recurrent ([place]) Stable, continue present management.  6. Hyperlipidemia, unspecified hyperlipidemia type Continue to follow  General Counseling: Thomes Dinning understanding of the findings of todays visit and agrees with plan of treatment. I have discussed any further diagnostic evaluation that may be needed or ordered today. We also reviewed his medications today. he has been encouraged to call the office with any questions or concerns that should arise related to todays visit.    Orders Placed This Encounter  Procedures  . Ambulatory referral to Hematology    Meds ordered this encounter  Medications  . amphetamine-dextroamphetamine (ADDERALL) 20 MG tablet    Sig: Take 1 tablet (20 mg total) by mouth in the morning, at noon, and at bedtime.    Dispense:  90 tablet    Refill:  0    Do not fill before 06/24/2019  . amphetamine-dextroamphetamine (ADDERALL) 20 MG tablet    Sig: Take 1 tablet (20 mg total) by mouth 3 (three) times daily.    Dispense:  90 tablet    Refill:  0    Do not fill before 07/24/19    Time spent: 30 Minutes   This patient was seen by Orson Gear AGNP-C in Collaboration with Dr Lavera Guise as a part of collaborative care agreement      Kendell Bane AGNP-C Internal medicine

## 2019-06-12 ENCOUNTER — Ambulatory Visit: Payer: BC Managed Care – PPO | Attending: Internal Medicine

## 2019-06-12 DIAGNOSIS — Z23 Encounter for immunization: Secondary | ICD-10-CM

## 2019-06-12 NOTE — Progress Notes (Signed)
   Covid-19 Vaccination Clinic  Name:  Navar Thorstenson    MRN: [id number] DOB: 1960/04/30  06/12/2019  Mr. [name] was observed post Covid-19 immunization for 15 minutes without incident. He was provided with Vaccine Information Sheet and instruction to access the V-Safe system.   Mr. Kayhan Souffront was instructed to call 911 with any severe reactions post vaccine: Marland Kitchen Difficulty breathing  . Swelling of face and throat  . A fast heartbeat  . A bad rash all over body  . Dizziness and weakness   Immunizations Administered    Name Date Dose VIS Date Route   Pfizer COVID-19 Vaccine 06/12/2019  1:38 PM 0.3 mL 04/18/2018 Intramuscular   Manufacturer: [place]   Lot: [id number]   [place]: [id number]

## 2019-06-18 DIAGNOSIS — M961 Postlaminectomy syndrome, not elsewhere classified: Secondary | ICD-10-CM | POA: Diagnosis not present

## 2019-06-18 DIAGNOSIS — M4722 Other spondylosis with radiculopathy, cervical region: Secondary | ICD-10-CM | POA: Diagnosis not present

## 2019-06-18 DIAGNOSIS — I10 Essential (primary) hypertension: Secondary | ICD-10-CM | POA: Diagnosis not present

## 2019-06-18 DIAGNOSIS — G894 Chronic pain syndrome: Secondary | ICD-10-CM | POA: Diagnosis not present

## 2019-06-18 DIAGNOSIS — I208 Other forms of angina pectoris: Secondary | ICD-10-CM | POA: Diagnosis not present

## 2019-06-18 DIAGNOSIS — Z79891 Long term (current) use of opiate analgesic: Secondary | ICD-10-CM | POA: Diagnosis not present

## 2019-06-18 DIAGNOSIS — E782 Mixed hyperlipidemia: Secondary | ICD-10-CM | POA: Diagnosis not present

## 2019-07-04 ENCOUNTER — Encounter: Payer: Self-pay | Admitting: Oncology

## 2019-07-08 NOTE — Progress Notes (Signed)
[name]  [phone] [phone]  ID: Randy Mclean Younger OB: [birth year]  MR#: [id number]  [id number]  Patient Care Team: Lavera Guise, MD as PCP - General (Internal Medicine) Bary Castilla Forest Gleason, MD (General Surgery) Lavera Guise, MD (Internal Medicine) Lloyd Huger, MD as Consulting Physician (Oncology)  CHIEF COMPLAINT: Iron deficiency anemia.  INTERVAL HISTORY: Patient was last evaluated in clinic in July 2020.  He returns for further evaluation and consideration of additional therapy.  Recent laboratory work noted a declining hemoglobin and iron stores.  Patient also endorses increased weakness and fatigue.  He has no neurologic complaints.  He denies any recent fevers or illnesses.  He has no chest pain, shortness of breath, cough, or hemoptysis.  He denies any nausea, vomiting, constipation, or diarrhea.  He has no melena or hematochezia.  He has no urinary complaints.  Patient offers no further specific complaints today.  REVIEW OF SYSTEMS:   Review of Systems  Constitutional: Positive for malaise/fatigue. Negative for fever and weight loss.  Respiratory: Negative.  Negative for cough and shortness of breath.   Cardiovascular: Negative.  Negative for chest pain and leg swelling.  Gastrointestinal: Negative.  Negative for abdominal pain, blood in stool and melena.  Genitourinary: Negative.  Negative for hematuria.  Musculoskeletal: Negative.  Negative for back pain and neck pain.  Skin: Negative.  Negative for rash.  Neurological: Positive for weakness. Negative for dizziness, sensory change, focal weakness and headaches.  Psychiatric/Behavioral: Negative.  The patient is not nervous/anxious.     As per HPI. Otherwise, a complete review of systems is negative.  PAST MEDICAL HISTORY: Past Medical History:  Diagnosis Date  . Acute appendicitis 12/04/2017  . Allergy    takes allergy shots  . Anemia   . Arthritis   .  Asthma   . Barrett esophagus   . Bronchitis   . Chronic pain   . Colon polyp   . Depression   . GERD (gastroesophageal reflux disease)   . Hemorrhoid   . Hyperlipidemia   . Hypertension   . Hypothyroid   . IBS (irritable bowel syndrome)   . Kidney stone   . MI (myocardial infarction) ([place])    between 2002 and 2006  . Migraine   . Pneumonia   . Seizures ([place])   . Sinus problem   . Spinal cord stimulator status     PAST SURGICAL HISTORY: Past Surgical History:  Procedure Laterality Date  . ANTERIOR CERVICAL DISCECTOMY    . APPENDECTOMY    . BACK SURGERY    . COLONOSCOPY  August 2015  . elbow surgery    . HEMORRHOID SURGERY    . LAPAROSCOPIC APPENDECTOMY N/A 12/04/2017   Procedure: APPENDECTOMY LAPAROSCOPIC;  Surgeon: Vickie Epley, MD;  Location: [facility] ORS;  Service: General;  Laterality: N/A;  . LIPOMA EXCISION Right    leg  . 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 SPINAL CORD STIMULATOR IMPLANT    . UPPER GI ENDOSCOPY  August 2015    FAMILY HISTORY Family History  Problem Relation Age of Onset  . Hypertension Mother   . Lung disease Mother   . Heart attack Father   . Diabetes Father        ADVANCED DIRECTIVES:    HEALTH MAINTENANCE: Social History   Tobacco Use  . Smoking status: Former Smoker    Packs/day: 1.00    Years: 30.00    Pack years: 30.00  Types: Cigarettes    Quit date: 02/22/2005    Years since quitting: 14.3  . Smokeless tobacco: Never Used  Substance Use Topics  . Alcohol use: Yes    Alcohol/week: 0.0 standard drinks    Comment: occasionally  . Drug use: No    Comment: past     Colonoscopy:  PAP:  Bone density:  Lipid panel:  Allergies  Allergen Reactions  . Amoxicillin Anaphylaxis    REACTION: unspecified  . Penicillin G Anaphylaxis  . Gabapentin Other (See Comments)    REACTION: anaphylaxis  . Penicillins     REACTION: unspecified  . Adhesive [Tape] Rash    Band-aid    Current Outpatient Medications   Medication Sig Dispense Refill  . albuterol (VENTOLIN HFA) 108 (90 Base) MCG/ACT inhaler Inhale 2 puffs into the lungs every 4 (four) hours as needed for wheezing or shortness of breath. 8.5 g 3  . alfuzosin (UROXATRAL) 10 MG 24 hr tablet TAKE 1 TABLET BY MOUTH DAILY 90 tablet 3  . ALPRAZolam (XANAX) 0.5 MG tablet Take 1 tablet (0.5 mg total) by mouth 3 (three) times daily as needed. for anxiety 90 tablet 2  . [START ON 07/24/2019] amphetamine-dextroamphetamine (ADDERALL) 20 MG tablet Take 1 tablet (20 mg total) by mouth 3 (three) times daily. 90 tablet 0  . atorvastatin (LIPITOR) 10 MG tablet Take 1 tablet (10 mg total) by mouth daily. 90 tablet 3  . azelastine (ASTELIN) 0.1 % nasal spray USE 1 SPRAY NASALLY 2 TIMES A DAY 60 mL 3  . buPROPion (WELLBUTRIN XL) 300 MG 24 hr tablet TAKE 1 TABLET BY MOUTH DAILY. GENERIC EQUIVALENT FOR WELLBUTRIL XL. DUE FOR OFFICE VISIT 90 tablet 1  . celecoxib (CELEBREX) 200 MG capsule Take 200 mg by mouth 2 (two) times daily.    . citalopram (CELEXA) 20 MG tablet TAKE 1 TABLET BY MOUTH DAILY 90 tablet 1  . clotrimazole-betamethasone (LOTRISONE) cream Apply 1 application topically 2 (two) times daily. 45 g 1  . esomeprazole (NEXIUM) 40 MG capsule TK ONE C PO  BID UTD  0  . l-methylfolate-B6-B12 (METANX) 3-35-2 MG TABS Take 1 tablet by mouth 2 (two) times daily.    Marland Kitchen losartan (COZAAR) 50 MG tablet Take 1 tablet (50 mg total) by mouth daily. 90 tablet 1  . meloxicam (MOBIC) 7.5 MG tablet Take 1 tablet (7.5 mg total) by mouth 2 (two) times daily. 180 tablet 1  . methocarbamol (ROBAXIN) 750 MG tablet Take 750 mg by mouth 4 (four) times daily as needed.    . metoprolol succinate (TOPROL-XL) 25 MG 24 hr tablet TAKE 1 TABLET BY MOUTH DAILY GENERIC EQUIVALENT FOR TOPROL XL 90 tablet 1  . mometasone-formoterol (DULERA) 100-5 MCG/ACT AERO INHALE 2 PUFFS BY MOUTH INTO THE LUNGS EVERY 12 HOURS 39 g 3  . Multiple Vitamin (MULTI-VITAMINS) TABS Take by mouth.    Gean Birchwood ER 200 MG  TB12 200 mg bid  0  . oxyCODONE-acetaminophen (PERCOCET) 7.5-325 MG per tablet Take 1 tablet by mouth every 4 (four) hours as needed for pain.     . pregabalin (LYRICA) 50 MG capsule Take 100 mg by mouth 3 (three) times daily.     Marland Kitchen senna (SENOKOT) 8.6 MG tablet Take by mouth.    . SYRINGE-NEEDLE, DISP, 3 ML (MONOJECT 3CC SYR 22GX1") 22G X 1" 3 ML MISC Use as directed.    . testosterone cypionate (DEPOTESTOSTERONE CYPIONATE) 200 MG/ML injection INJECT 0.75 MLS (75MG  TOTAL) INTO THE MUSCLE EVERY 7  DAYS AS DIRECTED 10 mL 2  . TIROSINT 200 MCG CAPS TAKE 1 CAPSULE BY MOUTH DAILY 90 capsule 2  . Vitamin D, Ergocalciferol, (DRISDOL) 1.25 MG (50000 UNIT) CAPS capsule Take 1 capsule (50,000 Units total) by mouth every 7 (seven) days. 10 capsule 0   No current facility-administered medications for this visit.    OBJECTIVE: Vitals:   07/10/19 1105  BP: (!) 148/83  Pulse: 67  Resp: 20  Temp: 98 F (36.7 C)  SpO2: 98%     Body mass index is 29.15 kg/m.    ECOG FS:0 - Asymptomatic  General: Well-developed, well-nourished, no acute distress. Eyes: Pink conjunctiva, anicteric sclera. HEENT: Normocephalic, moist mucous membranes. Lungs: No audible wheezing or coughing. Heart: Regular rate and rhythm. Abdomen: Soft, nontender, no obvious distention. Musculoskeletal: No edema, cyanosis, or clubbing. Neuro: Alert, answering all questions appropriately. Cranial nerves grossly intact. Skin: No rashes or petechiae noted. Psych: Normal affect.   LAB RESULTS:  Lab Results  Component Value Date   NA 139 05/05/2019   K 3.7 05/05/2019   CL 104 05/05/2019   CO2 28 05/05/2019   GLUCOSE 112 (H) 05/05/2019   BUN 12 05/05/2019   CREATININE 0.99 05/05/2019   CALCIUM 8.4 (L) 05/05/2019   PROT 6.3 10/11/2018   ALBUMIN 4.2 10/11/2018   AST 23 10/11/2018   ALT 24 10/11/2018   ALKPHOS 96 10/11/2018   BILITOT 0.3 10/11/2018   GFRNONAA >60 05/05/2019   GFRAA >60 05/05/2019    Lab Results    Component Value Date   WBC 5.5 07/10/2019   NEUTROABS 3.0 07/10/2019   HGB 10.8 (L) 07/10/2019   HCT 35.5 (L) 07/10/2019   MCV 72.4 (L) 07/10/2019   PLT 211 07/10/2019   Lab Results  Component Value Date   IRON 22 (L) 07/10/2019   TIBC 400 07/10/2019   IRONPCTSAT 6 (L) 07/10/2019   Lab Results  Component Value Date   FERRITIN 4 (L) 07/10/2019     STUDIES: No results found.  ASSESSMENT: Iron deficiency anemia.  PLAN:    1.  Iron deficiency anemia: Patient's hemoglobin and iron stores have trended down and he is symptomatic.  Proceed with 510 mg IV Feraheme today.  Return to clinic in 1 week for second infusion and then in 3 months with repeat laboratory work, further evaluation, and consideration of additional treatment if needed.      2. Family history of colon cancer:  Patient gets routine colonoscopies given his family history of colon cancer, all of which been unrevealing.  His next colonoscopy is due within the next year.  His genetic testing is negative. 3.  Neck pain: Resolved.  Patient underwent cervical vertebrae fusion surgery in fall 2020.  I spent a total of 30 minutes reviewing chart data, face-to-face evaluation with the patient, counseling and coordination of care as detailed above.   Patient expressed understanding and was in agreement with this plan. He also understands that He can call clinic at any time with any questions, concerns, or complaints.    Lloyd Huger, MD   07/10/2019 12:27 PM

## 2019-07-10 ENCOUNTER — Inpatient Hospital Stay: Payer: BC Managed Care – PPO

## 2019-07-10 ENCOUNTER — Encounter: Payer: Self-pay | Admitting: Oncology

## 2019-07-10 ENCOUNTER — Inpatient Hospital Stay: Payer: BC Managed Care – PPO | Attending: Oncology | Admitting: Oncology

## 2019-07-10 ENCOUNTER — Other Ambulatory Visit: Payer: Self-pay

## 2019-07-10 VITALS — BP 130/71 | HR 64 | Temp 97.6°F | Resp 18

## 2019-07-10 VITALS — BP 148/83 | HR 67 | Temp 98.0°F | Resp 20 | Wt 214.9 lb

## 2019-07-10 DIAGNOSIS — Z87891 Personal history of nicotine dependence: Secondary | ICD-10-CM | POA: Diagnosis not present

## 2019-07-10 DIAGNOSIS — Z801 Family history of malignant neoplasm of trachea, bronchus and lung: Secondary | ICD-10-CM | POA: Insufficient documentation

## 2019-07-10 DIAGNOSIS — Z880 Allergy status to penicillin: Secondary | ICD-10-CM | POA: Diagnosis not present

## 2019-07-10 DIAGNOSIS — D509 Iron deficiency anemia, unspecified: Secondary | ICD-10-CM | POA: Diagnosis not present

## 2019-07-10 DIAGNOSIS — Z8249 Family history of ischemic heart disease and other diseases of the circulatory system: Secondary | ICD-10-CM | POA: Diagnosis not present

## 2019-07-10 DIAGNOSIS — Z8719 Personal history of other diseases of the digestive system: Secondary | ICD-10-CM | POA: Diagnosis not present

## 2019-07-10 DIAGNOSIS — Z833 Family history of diabetes mellitus: Secondary | ICD-10-CM | POA: Diagnosis not present

## 2019-07-10 DIAGNOSIS — Z87442 Personal history of urinary calculi: Secondary | ICD-10-CM | POA: Insufficient documentation

## 2019-07-10 DIAGNOSIS — M1990 Unspecified osteoarthritis, unspecified site: Secondary | ICD-10-CM | POA: Diagnosis not present

## 2019-07-10 DIAGNOSIS — R309 Painful micturition, unspecified: Secondary | ICD-10-CM

## 2019-07-10 DIAGNOSIS — I252 Old myocardial infarction: Secondary | ICD-10-CM | POA: Diagnosis not present

## 2019-07-10 DIAGNOSIS — D508 Other iron deficiency anemias: Secondary | ICD-10-CM

## 2019-07-10 DIAGNOSIS — R5383 Other fatigue: Secondary | ICD-10-CM | POA: Insufficient documentation

## 2019-07-10 DIAGNOSIS — R531 Weakness: Secondary | ICD-10-CM | POA: Diagnosis not present

## 2019-07-10 DIAGNOSIS — Z79899 Other long term (current) drug therapy: Secondary | ICD-10-CM | POA: Insufficient documentation

## 2019-07-10 DIAGNOSIS — Z888 Allergy status to other drugs, medicaments and biological substances status: Secondary | ICD-10-CM | POA: Diagnosis not present

## 2019-07-10 DIAGNOSIS — J45909 Unspecified asthma, uncomplicated: Secondary | ICD-10-CM | POA: Insufficient documentation

## 2019-07-10 DIAGNOSIS — I10 Essential (primary) hypertension: Secondary | ICD-10-CM | POA: Insufficient documentation

## 2019-07-10 LAB — CBC WITH DIFFERENTIAL/PLATELET
Abs Immature Granulocytes: 0.01 10*3/uL (ref 0.00–0.07)
Basophils Absolute: 0.1 10*3/uL (ref 0.0–0.1)
Basophils Relative: 1 %
Eosinophils Absolute: 0.2 10*3/uL (ref 0.0–0.5)
Eosinophils Relative: 4 %
HCT: 35.5 % — ABNORMAL LOW (ref 39.0–52.0)
Hemoglobin: 10.8 g/dL — ABNORMAL LOW (ref 13.0–17.0)
Immature Granulocytes: 0 %
Lymphocytes Relative: 26 %
Lymphs Abs: 1.4 10*3/uL (ref 0.7–4.0)
MCH: 22 pg — ABNORMAL LOW (ref 26.0–34.0)
MCHC: 30.4 g/dL (ref 30.0–36.0)
MCV: 72.4 fL — ABNORMAL LOW (ref 80.0–100.0)
Monocytes Absolute: 0.8 10*3/uL (ref 0.1–1.0)
Monocytes Relative: 14 %
Neutro Abs: 3 10*3/uL (ref 1.7–7.7)
Neutrophils Relative %: 55 %
Platelets: 211 10*3/uL (ref 150–400)
RBC: 4.9 MIL/uL (ref 4.22–5.81)
RDW: 18.5 % — ABNORMAL HIGH (ref 11.5–15.5)
WBC: 5.5 10*3/uL (ref 4.0–10.5)
nRBC: 0 % (ref 0.0–0.2)

## 2019-07-10 LAB — FERRITIN: Ferritin: 4 ng/mL — ABNORMAL LOW (ref 24–336)

## 2019-07-10 LAB — IRON AND TIBC
Iron: 22 ug/dL — ABNORMAL LOW (ref 45–182)
Saturation Ratios: 6 % — ABNORMAL LOW (ref 17.9–39.5)
TIBC: 400 ug/dL (ref 250–450)
UIBC: 378 ug/dL

## 2019-07-10 LAB — URINALYSIS, COMPLETE (UACMP) WITH MICROSCOPIC
Bacteria, UA: NONE SEEN
Bilirubin Urine: NEGATIVE
Glucose, UA: NEGATIVE mg/dL
Hgb urine dipstick: NEGATIVE
Ketones, ur: NEGATIVE mg/dL
Leukocytes,Ua: NEGATIVE
Nitrite: NEGATIVE
Protein, ur: NEGATIVE mg/dL
Specific Gravity, Urine: 1.005 (ref 1.005–1.030)
Squamous Epithelial / HPF: NONE SEEN (ref 0–5)
WBC, UA: NONE SEEN WBC/hpf (ref 0–5)
pH: 6 (ref 5.0–8.0)

## 2019-07-10 MED ORDER — SODIUM CHLORIDE 0.9 % IV SOLN
Freq: Once | INTRAVENOUS | Status: AC
  Filled 2019-07-10: qty 250

## 2019-07-10 MED ORDER — SODIUM CHLORIDE 0.9 % IV SOLN
510.0000 mg | Freq: Once | INTRAVENOUS | Status: AC
  Administered 2019-07-10: 510 mg via INTRAVENOUS
  Filled 2019-07-10: qty 510

## 2019-07-10 NOTE — Progress Notes (Signed)
Patient reports some mild burning when urinating, started a week ago. States he feels very exhausted and has frequent headaches.

## 2019-07-10 NOTE — Progress Notes (Signed)
Pt tolerated infusion well. Pt and VS stable at discharge.  

## 2019-07-11 LAB — URINE CULTURE: Culture: NO GROWTH

## 2019-07-17 ENCOUNTER — Other Ambulatory Visit: Payer: Self-pay

## 2019-07-17 ENCOUNTER — Inpatient Hospital Stay: Payer: BC Managed Care – PPO

## 2019-07-17 VITALS — BP 111/64 | HR 79 | Temp 97.6°F | Resp 18

## 2019-07-17 DIAGNOSIS — Z833 Family history of diabetes mellitus: Secondary | ICD-10-CM | POA: Diagnosis not present

## 2019-07-17 DIAGNOSIS — R5383 Other fatigue: Secondary | ICD-10-CM | POA: Diagnosis not present

## 2019-07-17 DIAGNOSIS — D508 Other iron deficiency anemias: Secondary | ICD-10-CM

## 2019-07-17 DIAGNOSIS — R531 Weakness: Secondary | ICD-10-CM | POA: Diagnosis not present

## 2019-07-17 DIAGNOSIS — J45909 Unspecified asthma, uncomplicated: Secondary | ICD-10-CM | POA: Diagnosis not present

## 2019-07-17 DIAGNOSIS — Z87442 Personal history of urinary calculi: Secondary | ICD-10-CM | POA: Diagnosis not present

## 2019-07-17 DIAGNOSIS — D509 Iron deficiency anemia, unspecified: Secondary | ICD-10-CM | POA: Diagnosis not present

## 2019-07-17 DIAGNOSIS — Z87891 Personal history of nicotine dependence: Secondary | ICD-10-CM | POA: Diagnosis not present

## 2019-07-17 DIAGNOSIS — M1990 Unspecified osteoarthritis, unspecified site: Secondary | ICD-10-CM | POA: Diagnosis not present

## 2019-07-17 DIAGNOSIS — Z801 Family history of malignant neoplasm of trachea, bronchus and lung: Secondary | ICD-10-CM | POA: Diagnosis not present

## 2019-07-17 DIAGNOSIS — I252 Old myocardial infarction: Secondary | ICD-10-CM | POA: Diagnosis not present

## 2019-07-17 DIAGNOSIS — Z79899 Other long term (current) drug therapy: Secondary | ICD-10-CM | POA: Diagnosis not present

## 2019-07-17 DIAGNOSIS — I10 Essential (primary) hypertension: Secondary | ICD-10-CM | POA: Diagnosis not present

## 2019-07-17 DIAGNOSIS — Z8249 Family history of ischemic heart disease and other diseases of the circulatory system: Secondary | ICD-10-CM | POA: Diagnosis not present

## 2019-07-17 DIAGNOSIS — Z8719 Personal history of other diseases of the digestive system: Secondary | ICD-10-CM | POA: Diagnosis not present

## 2019-07-17 DIAGNOSIS — Z880 Allergy status to penicillin: Secondary | ICD-10-CM | POA: Diagnosis not present

## 2019-07-17 DIAGNOSIS — Z888 Allergy status to other drugs, medicaments and biological substances status: Secondary | ICD-10-CM | POA: Diagnosis not present

## 2019-07-17 MED ORDER — SODIUM CHLORIDE 0.9 % IV SOLN
510.0000 mg | Freq: Once | INTRAVENOUS | Status: AC
  Administered 2019-07-17: 510 mg via INTRAVENOUS
  Filled 2019-07-17: qty 510

## 2019-07-17 MED ORDER — SODIUM CHLORIDE 0.9 % IV SOLN
Freq: Once | INTRAVENOUS | Status: AC
  Filled 2019-07-17: qty 250

## 2019-07-21 ENCOUNTER — Other Ambulatory Visit: Payer: Self-pay | Admitting: Adult Health

## 2019-07-21 DIAGNOSIS — E559 Vitamin D deficiency, unspecified: Secondary | ICD-10-CM

## 2019-07-28 ENCOUNTER — Other Ambulatory Visit: Payer: Self-pay | Admitting: Adult Health

## 2019-07-30 ENCOUNTER — Other Ambulatory Visit: Payer: Self-pay

## 2019-07-30 MED ORDER — ATORVASTATIN CALCIUM 10 MG PO TABS: 10.0000 mg | ORAL_TABLET | Freq: Every day | ORAL | 1 refills | Status: DC

## 2019-07-30 MED ORDER — MELOXICAM 7.5 MG PO TABS: 7.5000 mg | ORAL_TABLET | Freq: Two times a day (BID) | ORAL | 1 refills | Status: DC

## 2019-08-02 ENCOUNTER — Telehealth: Payer: Self-pay

## 2019-08-02 NOTE — Telephone Encounter (Signed)
Called lmom informing patient of appointment on 08/06/2019. klh

## 2019-08-06 ENCOUNTER — Encounter: Payer: Self-pay | Admitting: Adult Health

## 2019-08-06 ENCOUNTER — Ambulatory Visit: Payer: BC Managed Care – PPO | Admitting: Adult Health

## 2019-08-06 ENCOUNTER — Other Ambulatory Visit: Payer: Self-pay

## 2019-08-06 VITALS — BP 155/82 | HR 71 | Temp 97.8°F | Resp 16 | Ht 72.0 in | Wt 211.6 lb

## 2019-08-06 DIAGNOSIS — F339 Major depressive disorder, recurrent, unspecified: Secondary | ICD-10-CM | POA: Diagnosis not present

## 2019-08-06 DIAGNOSIS — F411 Generalized anxiety disorder: Secondary | ICD-10-CM

## 2019-08-06 DIAGNOSIS — M5440 Lumbago with sciatica, unspecified side: Secondary | ICD-10-CM

## 2019-08-06 DIAGNOSIS — E785 Hyperlipidemia, unspecified: Secondary | ICD-10-CM

## 2019-08-06 DIAGNOSIS — F410 Panic disorder [episodic paroxysmal anxiety] without agoraphobia: Secondary | ICD-10-CM

## 2019-08-06 DIAGNOSIS — G47429 Narcolepsy in conditions classified elsewhere without cataplexy: Secondary | ICD-10-CM

## 2019-08-06 DIAGNOSIS — I10 Essential (primary) hypertension: Secondary | ICD-10-CM | POA: Diagnosis not present

## 2019-08-06 MED ORDER — AMPHETAMINE-DEXTROAMPHETAMINE 20 MG PO TABS: 20.0000 mg | ORAL_TABLET | Freq: Three times a day (TID) | ORAL | 0 refills | Status: DC

## 2019-08-06 MED ORDER — TIZANIDINE HCL 2 MG PO TABS: 2.0000 mg | ORAL_TABLET | Freq: Every day | ORAL | 0 refills | Status: DC

## 2019-08-06 MED ORDER — ALPRAZOLAM 0.5 MG PO TABS: 0.5000 mg | ORAL_TABLET | Freq: Three times a day (TID) | ORAL | 2 refills | Status: DC | PRN

## 2019-08-06 NOTE — Progress Notes (Addendum)
[facility] [place], [place] [address]  Internal MEDICINE  Office Visit Note  Patient Name: Randy Mclean  [id number]  [id number]  Date of Service: 08/06/2019  Chief Complaint  Patient presents with  . Follow-up    right side pain all the way down to right leg  . Depression  . Gastroesophageal Reflux  . Hyperlipidemia  . Hypertension    HPI  Pt is here for follow up on HTN, narcolepsy, HLD, and anxiety/depression. Overall he is doing well.  He continues to use adderall for his narcolepsy.his blood pressure is typically controlled at home.  He is slightly elevated in office. Denies Chest pain, Shortness of breath, palpitations, headache, or blurred vision.       Current Medication: Outpatient Encounter Medications as of 08/06/2019  Medication Sig  . albuterol (VENTOLIN HFA) 108 (90 Base) MCG/ACT inhaler Inhale 2 puffs into the lungs every 4 (four) hours as needed for wheezing or shortness of breath.  . alfuzosin (UROXATRAL) 10 MG 24 hr tablet TAKE 1 TABLET BY MOUTH DAILY  . ALPRAZolam (XANAX) 0.5 MG tablet Take 1 tablet (0.5 mg total) by mouth 3 (three) times daily as needed. for anxiety  . amphetamine-dextroamphetamine (ADDERALL) 20 MG tablet Take 1 tablet (20 mg total) by mouth 3 (three) times daily.  Marland Kitchen atorvastatin (LIPITOR) 10 MG tablet Take 1 tablet (10 mg total) by mouth daily.  Marland Kitchen azelastine (ASTELIN) 0.1 % nasal spray USE 1 SPRAY NASALLY 2 TIMES A DAY  . buPROPion (WELLBUTRIN XL) 300 MG 24 hr tablet TAKE 1 TABLET BY MOUTH DAILY. GENERIC EQUIVALENT FOR WELLBUTRIL XL. DUE FOR OFFICE VISIT  . celecoxib (CELEBREX) 200 MG capsule Take 200 mg by mouth 2 (two) times daily.  . citalopram (CELEXA) 20 MG tablet TAKE 1 TABLET BY MOUTH DAILY  . clotrimazole-betamethasone (LOTRISONE) cream Apply 1 application topically 2 (two) times daily.  Marland Kitchen esomeprazole (NEXIUM) 40 MG capsule TK ONE C PO  BID UTD  . l-methylfolate-B6-B12 (METANX) 3-35-2 MG TABS Take  1 tablet by mouth 2 (two) times daily.  Marland Kitchen losartan (COZAAR) 50 MG tablet Take 1 tablet (50 mg total) by mouth daily.  . meloxicam (MOBIC) 7.5 MG tablet Take 1 tablet (7.5 mg total) by mouth 2 (two) times daily.  . methocarbamol (ROBAXIN) 750 MG tablet Take 750 mg by mouth 4 (four) times daily as needed.  . metoprolol succinate (TOPROL-XL) 25 MG 24 hr tablet TAKE 1 TABLET BY MOUTH DAILY. GENERIC EQUIVALENT FOR TOPROL XL  . mometasone-formoterol (DULERA) 100-5 MCG/ACT AERO INHALE 2 PUFFS BY MOUTH INTO THE LUNGS EVERY 12 HOURS  . Multiple Vitamin (MULTI-VITAMINS) TABS Take by mouth.  Gean Birchwood ER 200 MG TB12 200 mg bid  . oxyCODONE-acetaminophen (PERCOCET) 7.5-325 MG per tablet Take 1 tablet by mouth every 4 (four) hours as needed for pain.   . pregabalin (LYRICA) 50 MG capsule Take 100 mg by mouth 3 (three) times daily.   Marland Kitchen senna (SENOKOT) 8.6 MG tablet Take by mouth.  . SYRINGE-NEEDLE, DISP, 3 ML (MONOJECT 3CC SYR 22GX1") 22G X 1" 3 ML MISC Use as directed.  . testosterone cypionate (DEPOTESTOSTERONE CYPIONATE) 200 MG/ML injection INJECT 0.75 MLS (75MG  TOTAL) INTO THE MUSCLE EVERY 7 DAYS AS DIRECTED  . TIROSINT 200 MCG CAPS TAKE 1 CAPSULE BY MOUTH DAILY  . Vitamin D, Ergocalciferol, (DRISDOL) 1.25 MG (50000 UNIT) CAPS capsule TAKE 1 CAPSULE BY MOUTH EVERY 7 DAYS  . [DISCONTINUED] ALPRAZolam (XANAX) 0.5 MG tablet Take 1  tablet (0.5 mg total) by mouth 3 (three) times daily as needed. for anxiety  . [DISCONTINUED] amphetamine-dextroamphetamine (ADDERALL) 20 MG tablet Take 1 tablet (20 mg total) by mouth 3 (three) times daily.  Derrill Memo ON 09/23/2019] amphetamine-dextroamphetamine (ADDERALL) 20 MG tablet Take 1 tablet (20 mg total) by mouth in the morning, at noon, and at bedtime.   No facility-administered encounter medications on file as of 08/06/2019.    Surgical History: Past Surgical History:  Procedure Laterality Date  . ANTERIOR CERVICAL DISCECTOMY    . APPENDECTOMY    . BACK SURGERY    .  COLONOSCOPY  August 2015  . elbow surgery    . HEMORRHOID SURGERY    . LAPAROSCOPIC APPENDECTOMY N/A 12/04/2017   Procedure: APPENDECTOMY LAPAROSCOPIC;  Surgeon: Vickie Epley, MD;  Location: [facility] ORS;  Service: General;  Laterality: N/A;  . LIPOMA EXCISION Right    leg  . 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 SPINAL CORD STIMULATOR IMPLANT    . UPPER GI ENDOSCOPY  August 2015    Medical History: Past Medical History:  Diagnosis Date  . Acute appendicitis 12/04/2017  . Allergy    takes allergy shots  . Anemia   . Arthritis   . Asthma   . Barrett esophagus   . Bronchitis   . Chronic pain   . Colon polyp   . Depression   . GERD (gastroesophageal reflux disease)   . Hemorrhoid   . Hyperlipidemia   . Hypertension   . Hypothyroid   . IBS (irritable bowel syndrome)   . Kidney stone   . MI (myocardial infarction) ([place])    between 2002 and 2006  . Migraine   . Pneumonia   . Seizures ([place])   . Sinus problem   . Spinal cord stimulator status     Family History: Family History  Problem Relation Age of Onset  . Hypertension Mother   . Lung disease Mother   . Heart attack Father   . Diabetes Father     Social History   Socioeconomic History  . Marital status: Married    Spouse name: Not on file  . Number of children: Not on file  . Years of education: Not on file  . Highest education level: Not on file  Occupational History  . Not on file  Tobacco Use  . Smoking status: Former Smoker    Packs/day: 1.00    Years: 30.00    Pack years: 30.00    Types: Cigarettes    Quit date: 02/22/2005    Years since quitting: 14.4  . Smokeless tobacco: Never Used  Vaping Use  . Vaping Use: Never used  Substance and Sexual Activity  . Alcohol use: Yes    Alcohol/week: 0.0 standard drinks    Comment: occasionally  . Drug use: No    Comment: past  . Sexual activity: Yes  Other Topics Concern  . Not on file  Social History Narrative  . Not on file   Social  Determinants of Health   Financial Resource Strain:   . Difficulty of Paying Living Expenses:   Food Insecurity:   . Worried About Charity fundraiser in the Last Year:   . Arboriculturist in the Last Year:   Transportation Needs:   . Film/video editor (Medical):   Marland Kitchen Lack of Transportation (Non-Medical):   Physical Activity:   . Days of Exercise per Week:   . Minutes of  Exercise per Session:   Stress:   . Feeling of Stress :   Social Connections:   . Frequency of Communication with Friends and Family:   . Frequency of Social Gatherings with Friends and Family:   . Attends Religious Services:   . Active Member of Clubs or Organizations:   . Attends Archivist Meetings:   Marland Kitchen Marital Status:   Intimate Partner Violence:   . Fear of Current or Ex-Partner:   . Emotionally Abused:   Marland Kitchen Physically Abused:   . Sexually Abused:       Review of Systems  Constitutional: Negative.  Negative for chills, fatigue and unexpected weight change.  HENT: Negative.  Negative for congestion, rhinorrhea, sneezing and sore throat.   Eyes: Negative for redness.  Respiratory: Negative.  Negative for cough, chest tightness and shortness of breath.   Cardiovascular: Negative.  Negative for chest pain and palpitations.  Gastrointestinal: Negative.  Negative for abdominal pain, constipation, diarrhea, nausea and vomiting.  Endocrine: Negative.   Genitourinary: Negative.  Negative for dysuria and frequency.  Musculoskeletal: Negative.  Negative for arthralgias, back pain, joint swelling and neck pain.  Skin: Negative.  Negative for rash.  Allergic/Immunologic: Negative.   Neurological: Negative.  Negative for tremors and numbness.  Hematological: Negative for adenopathy. Does not bruise/bleed easily.  Psychiatric/Behavioral: Negative.  Negative for behavioral problems, sleep disturbance and suicidal ideas. The patient is not nervous/anxious.     Vital Signs: BP (!) 155/82   Pulse 71    Temp 97.8 F (36.6 C)   Resp 16   Ht 6' (1.829 m)   Wt 211 lb 9.6 oz (96 kg)   SpO2 96%   BMI 28.70 kg/m    Physical Exam Vitals and nursing note reviewed.  Constitutional:      General: He is not in acute distress.    Appearance: He is well-developed. He is not diaphoretic.  HENT:     Head: Normocephalic and atraumatic.     Mouth/Throat:     Pharynx: No oropharyngeal exudate.  Eyes:     Pupils: Pupils are equal, round, and reactive to light.  Neck:     Thyroid: No thyromegaly.     Vascular: No JVD.     Trachea: No tracheal deviation.  Cardiovascular:     Rate and Rhythm: Normal rate and regular rhythm.     Heart sounds: Normal heart sounds. No murmur heard.  No friction rub. No gallop.   Pulmonary:     Effort: Pulmonary effort is normal. No respiratory distress.     Breath sounds: Normal breath sounds. No wheezing or rales.  Chest:     Chest wall: No tenderness.  Abdominal:     Palpations: Abdomen is soft.     Tenderness: There is no abdominal tenderness. There is no guarding.  Musculoskeletal:        General: Normal range of motion.     Cervical back: Normal range of motion and neck supple.  Lymphadenopathy:     Cervical: No cervical adenopathy.  Skin:    General: Skin is warm and dry.  Neurological:     Mental Status: He is alert and oriented to person, place, and time.     Cranial Nerves: No cranial nerve deficit.  Psychiatric:        Behavior: Behavior normal.        Thought Content: Thought content normal.        Judgment: Judgment normal.     Assessment/Plan: 1.  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 Reviewed risks and possible side effects associated with taking Stimulants. Combination of these drugs with other psychotropic medications could cause dizziness and drowsiness. Pt needs to Monitor symptoms and exercise caution in driving and operating heavy machinery to avoid damages to oneself, to  others and to the surroundings. Patient verbalized understanding in this matter. Dependence and abuse for these drugs will be monitored closely. A Controlled substance policy and procedure is on file which allows [facility] to order a urine drug screen test at any visit. Patient understands and agrees with the plan.. - amphetamine-dextroamphetamine (ADDERALL) 20 MG tablet; Take 1 tablet (20 mg total) by mouth 3 (three) times daily.  Dispense: 90 tablet; Refill: 0  2. Generalized anxiety disorder with panic attacks Reviewed risks and possible side effects associated with taking opiates, benzodiazepines and other CNS depressants. Combination of these could cause dizziness and drowsiness. Advised patient not to drive or operate machinery when taking these medications, as patient's and other's life can be at risk and will have consequences. Patient verbalized understanding in this matter. Dependence and abuse for these drugs will be monitored closely. A Controlled substance policy and procedure is on file which allows [facility] to order a urine drug screen test at any visit. Patient understands and agrees with the plan - ALPRAZolam (XANAX) 0.5 MG tablet; Take 1 tablet (0.5 mg total) by mouth 3 (three) times daily as needed. for anxiety  Dispense: 90 tablet; Refill: 2  3. Depression, recurrent ([place]) Well controlled currently.  - ALPRAZolam (XANAX) 0.5 MG tablet; Take 1 tablet (0.5 mg total) by mouth 3 (three) times daily as needed. for anxiety  Dispense: 90 tablet; Refill: 2  4. Essential hypertension Slightly elevated, continue to monitor.   5. Hyperlipidemia, unspecified hyperlipidemia type Continue to follow.   General Counseling: jakhai fant understanding of the findings of todays visit and agrees with plan of treatment. I have discussed any further diagnostic evaluation that may be needed or ordered today. We also reviewed his medications today. he has been  encouraged to call the office with any questions or concerns that should arise related to todays visit.    No orders of the defined types were placed in this encounter.   Meds ordered this encounter  Medications  . ALPRAZolam (XANAX) 0.5 MG tablet    Sig: Take 1 tablet (0.5 mg total) by mouth 3 (three) times daily as needed. for anxiety    Dispense:  90 tablet    Refill:  2  . amphetamine-dextroamphetamine (ADDERALL) 20 MG tablet    Sig: Take 1 tablet (20 mg total) by mouth 3 (three) times daily.    Dispense:  90 tablet    Refill:  0    Do not fill before 08/23/19  . amphetamine-dextroamphetamine (ADDERALL) 20 MG tablet    Sig: Take 1 tablet (20 mg total) by mouth in the morning, at noon, and at bedtime.    Dispense:  90 tablet    Refill:  0    Do not fill before 09/23/2019    Time spent: 30 Minutes   This patient was seen by Orson Gear AGNP-C in Collaboration with Dr Lavera Guise as a part of collaborative care agreement     Kendell Bane AGNP-C Internal medicine

## 2019-08-09 MED ORDER — PREDNISONE 10 MG PO TABS: ORAL_TABLET | ORAL | 0 refills | Status: DC

## 2019-08-13 ENCOUNTER — Other Ambulatory Visit: Payer: Self-pay | Admitting: Adult Health

## 2019-08-13 DIAGNOSIS — M5440 Lumbago with sciatica, unspecified side: Secondary | ICD-10-CM

## 2019-08-16 DIAGNOSIS — M4722 Other spondylosis with radiculopathy, cervical region: Secondary | ICD-10-CM | POA: Diagnosis not present

## 2019-08-16 DIAGNOSIS — M961 Postlaminectomy syndrome, not elsewhere classified: Secondary | ICD-10-CM | POA: Diagnosis not present

## 2019-08-16 DIAGNOSIS — G894 Chronic pain syndrome: Secondary | ICD-10-CM | POA: Diagnosis not present

## 2019-08-16 DIAGNOSIS — Z79891 Long term (current) use of opiate analgesic: Secondary | ICD-10-CM | POA: Diagnosis not present

## 2019-08-22 ENCOUNTER — Encounter: Payer: Self-pay | Admitting: Adult Health

## 2019-08-22 ENCOUNTER — Ambulatory Visit
Admission: RE | Admit: 2019-08-22 | Discharge: 2019-08-22 | Disposition: A | Discharge status: Home / Self Care | Payer: BC Managed Care – PPO | Attending: Adult Health | Admitting: Adult Health

## 2019-08-22 ENCOUNTER — Other Ambulatory Visit: Payer: Self-pay

## 2019-08-22 ENCOUNTER — Ambulatory Visit: Payer: BC Managed Care – PPO | Admitting: Adult Health

## 2019-08-22 VITALS — Temp 98.9°F | Resp 16 | Ht 72.0 in | Wt 210.0 lb

## 2019-08-22 DIAGNOSIS — J988 Other specified respiratory disorders: Secondary | ICD-10-CM | POA: Diagnosis not present

## 2019-08-22 DIAGNOSIS — M25559 Pain in unspecified hip: Secondary | ICD-10-CM

## 2019-08-22 DIAGNOSIS — R05 Cough: Secondary | ICD-10-CM

## 2019-08-22 DIAGNOSIS — R059 Cough, unspecified: Secondary | ICD-10-CM

## 2019-08-22 MED ORDER — LEVOFLOXACIN 500 MG PO TABS: 500.0000 mg | ORAL_TABLET | Freq: Every day | ORAL | 0 refills | Status: DC

## 2019-08-22 MED ORDER — HYDROCOD POLST-CPM POLST ER 10-8 MG/5ML PO SUER: 5.0000 mL | Freq: Two times a day (BID) | ORAL | 0 refills | Status: DC | PRN

## 2019-08-22 NOTE — Progress Notes (Signed)
[facility] [place], [place] [address]  Internal MEDICINE  Telephone Visit  Patient Name: Randy Mclean  [id number]  [id number]  Date of Service: 08/22/2019  I connected with the patient at 1018 by telephone and verified the patients identity using two identifiers.   I discussed the limitations, risks, security and privacy concerns of performing an evaluation and management service by telephone and the availability of in person appointments. I also discussed with the patient that there may be a patient responsible charge related to the service.  The patient expressed understanding and agrees to proceed.    Chief Complaint  Patient presents with  . Telephone Assessment    upper resp inf  . Telephone Screen    xray hip/back  . Cough    HPI  Pt is seen via telephone.  He is complaining today of coughing for 4 days.  He has a low grade fever, sore throat, chest congestion.  He reports green colored productivity with cough.    Current Medication: Outpatient Encounter Medications as of 08/22/2019  Medication Sig  . albuterol (VENTOLIN HFA) 108 (90 Base) MCG/ACT inhaler Inhale 2 puffs into the lungs every 4 (four) hours as needed for wheezing or shortness of breath.  . alfuzosin (UROXATRAL) 10 MG 24 hr tablet TAKE 1 TABLET BY MOUTH DAILY  . ALPRAZolam (XANAX) 0.5 MG tablet Take 1 tablet (0.5 mg total) by mouth 3 (three) times daily as needed. for anxiety  . amphetamine-dextroamphetamine (ADDERALL) 20 MG tablet Take 1 tablet (20 mg total) by mouth 3 (three) times daily.  Derrill Memo ON 09/23/2019] amphetamine-dextroamphetamine (ADDERALL) 20 MG tablet Take 1 tablet (20 mg total) by mouth in the morning, at noon, and at bedtime.  Marland Kitchen atorvastatin (LIPITOR) 10 MG tablet Take 1 tablet (10 mg total) by mouth daily.  Marland Kitchen azelastine (ASTELIN) 0.1 % nasal spray USE 1 SPRAY NASALLY 2 TIMES A DAY  . buPROPion (WELLBUTRIN XL) 300 MG 24 hr tablet TAKE 1 TABLET BY MOUTH DAILY.  GENERIC EQUIVALENT FOR WELLBUTRIL XL. DUE FOR OFFICE VISIT  . celecoxib (CELEBREX) 200 MG capsule Take 200 mg by mouth 2 (two) times daily.  . citalopram (CELEXA) 20 MG tablet TAKE 1 TABLET BY MOUTH DAILY  . clotrimazole-betamethasone (LOTRISONE) cream Apply 1 application topically 2 (two) times daily.  Marland Kitchen esomeprazole (NEXIUM) 40 MG capsule TK ONE C PO  BID UTD  . l-methylfolate-B6-B12 (METANX) 3-35-2 MG TABS Take 1 tablet by mouth 2 (two) times daily.  Marland Kitchen losartan (COZAAR) 50 MG tablet Take 1 tablet (50 mg total) by mouth daily.  . meloxicam (MOBIC) 7.5 MG tablet Take 1 tablet (7.5 mg total) by mouth 2 (two) times daily.  . methocarbamol (ROBAXIN) 750 MG tablet Take 750 mg by mouth 4 (four) times daily as needed.  . metoprolol succinate (TOPROL-XL) 25 MG 24 hr tablet TAKE 1 TABLET BY MOUTH DAILY. GENERIC EQUIVALENT FOR TOPROL XL  . mometasone-formoterol (DULERA) 100-5 MCG/ACT AERO INHALE 2 PUFFS BY MOUTH INTO THE LUNGS EVERY 12 HOURS  . Multiple Vitamin (MULTI-VITAMINS) TABS Take by mouth.  Gean Birchwood ER 200 MG TB12 200 mg bid  . oxyCODONE-acetaminophen (PERCOCET) 7.5-325 MG per tablet Take 1 tablet by mouth every 4 (four) hours as needed for pain.   . predniSONE (DELTASONE) 10 MG tablet Use per dose pack  . pregabalin (LYRICA) 50 MG capsule Take 100 mg by mouth 3 (three) times daily.   Marland Kitchen senna (SENOKOT) 8.6 MG tablet Take by mouth.  Marland Kitchen  SYRINGE-NEEDLE, DISP, 3 ML (MONOJECT 3CC SYR 22GX1") 22G X 1" 3 ML MISC Use as directed.  . testosterone cypionate (DEPOTESTOSTERONE CYPIONATE) 200 MG/ML injection INJECT 0.75 MLS (75MG  TOTAL) INTO THE MUSCLE EVERY 7 DAYS AS DIRECTED  . TIROSINT 200 MCG CAPS TAKE 1 CAPSULE BY MOUTH DAILY  . tiZANidine (ZANAFLEX) 2 MG tablet TAKE 1 TABLET(2 MG) BY MOUTH AT BEDTIME  . Vitamin D, Ergocalciferol, (DRISDOL) 1.25 MG (50000 UNIT) CAPS capsule TAKE 1 CAPSULE BY MOUTH EVERY 7 DAYS  . chlorpheniramine-HYDROcodone (TUSSIONEX PENNKINETIC ER) 10-8 MG/5ML SUER Take 5 mLs by  mouth every 12 (twelve) hours as needed for cough.  Marland Kitchen levofloxacin (LEVAQUIN) 500 MG tablet Take 1 tablet (500 mg total) by mouth daily.   No facility-administered encounter medications on file as of 08/22/2019.    Surgical History: Past Surgical History:  Procedure Laterality Date  . ANTERIOR CERVICAL DISCECTOMY    . APPENDECTOMY    . BACK SURGERY    . COLONOSCOPY  August 2015  . elbow surgery    . HEMORRHOID SURGERY    . LAPAROSCOPIC APPENDECTOMY N/A 12/04/2017   Procedure: APPENDECTOMY LAPAROSCOPIC;  Surgeon: Vickie Epley, MD;  Location: [facility] ORS;  Service: General;  Laterality: N/A;  . LIPOMA EXCISION Right    leg  . 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 SPINAL CORD STIMULATOR IMPLANT    . UPPER GI ENDOSCOPY  August 2015    Medical History: Past Medical History:  Diagnosis Date  . Acute appendicitis 12/04/2017  . Allergy    takes allergy shots  . Anemia   . Arthritis   . Asthma   . Barrett esophagus   . Bronchitis   . Chronic pain   . Colon polyp   . Depression   . GERD (gastroesophageal reflux disease)   . Hemorrhoid   . Hyperlipidemia   . Hypertension   . Hypothyroid   . IBS (irritable bowel syndrome)   . Kidney stone   . MI (myocardial infarction) ([place])    between 2002 and 2006  . Migraine   . Pneumonia   . Seizures ([place])   . Sinus problem   . Spinal cord stimulator status     Family History: Family History  Problem Relation Age of Onset  . Hypertension Mother   . Lung disease Mother   . Heart attack Father   . Diabetes Father     Social History   Socioeconomic History  . Marital status: Married    Spouse name: Not on file  . Number of children: Not on file  . Years of education: Not on file  . Highest education level: Not on file  Occupational History  . Not on file  Tobacco Use  . Smoking status: Former Smoker    Packs/day: 1.00    Years: 30.00    Pack years: 30.00    Types: Cigarettes    Quit date: 02/22/2005     Years since quitting: 14.5  . Smokeless tobacco: Never Used  Vaping Use  . Vaping Use: Never used  Substance and Sexual Activity  . Alcohol use: Yes    Alcohol/week: 0.0 standard drinks    Comment: occasionally  . Drug use: No    Comment: past  . Sexual activity: Yes  Other Topics Concern  . Not on file  Social History Narrative  . Not on file   Social Determinants of Health   Financial Resource Strain:   . Difficulty of Paying Living  Expenses:   Food Insecurity:   . Worried About Charity fundraiser in the Last Year:   . Arboriculturist in the Last Year:   Transportation Needs:   . Film/video editor (Medical):   Marland Kitchen Lack of Transportation (Non-Medical):   Physical Activity:   . Days of Exercise per Week:   . Minutes of Exercise per Session:   Stress:   . Feeling of Stress :   Social Connections:   . Frequency of Communication with Friends and Family:   . Frequency of Social Gatherings with Friends and Family:   . Attends Religious Services:   . Active Member of Clubs or Organizations:   . Attends Archivist Meetings:   Marland Kitchen Marital Status:   Intimate Partner Violence:   . Fear of Current or Ex-Partner:   . Emotionally Abused:   Marland Kitchen Physically Abused:   . Sexually Abused:       Review of Systems  Constitutional: Negative.  Negative for chills, fatigue and unexpected weight change.  HENT: Negative.  Negative for congestion, rhinorrhea, sneezing and sore throat.   Eyes: Negative for redness.  Respiratory: Negative.  Negative for cough, chest tightness and shortness of breath.   Cardiovascular: Negative.  Negative for chest pain and palpitations.  Gastrointestinal: Negative.  Negative for abdominal pain, constipation, diarrhea, nausea and vomiting.  Endocrine: Negative.   Genitourinary: Negative.  Negative for dysuria and frequency.  Musculoskeletal: Negative.  Negative for arthralgias, back pain, joint swelling and neck pain.  Skin: Negative.  Negative  for rash.  Allergic/Immunologic: Negative.   Neurological: Negative.  Negative for tremors and numbness.  Hematological: Negative for adenopathy. Does not bruise/bleed easily.  Psychiatric/Behavioral: Negative.  Negative for behavioral problems, sleep disturbance and suicidal ideas. The patient is not nervous/anxious.     Vital Signs: Temp 98.9 F (37.2 C)   Resp 16   Ht 6' (1.829 m)   Wt 210 lb (95.3 kg)   BMI 28.48 kg/m    Observation/Objective:  Ill sounding,excessive coughing on phone.    Assessment/Plan: 1. Respiratory infection Advised patient to take entire course of antibiotics as prescribed with food. Pt should return to clinic in 7-10 days if symptoms fail to improve or new symptoms develop.  - levofloxacin (LEVAQUIN) 500 MG tablet; Take 1 tablet (500 mg total) by mouth daily.  Dispense: 7 tablet; Refill: 0  2. Cough Reviewed risks and possible side effects associated with taking opiates, benzodiazepines and other CNS depressants. Combination of these could cause dizziness and drowsiness. Advised patient not to drive or operate machinery when taking these medications, as patient's and other's life can be at risk and will have consequences. Patient verbalized understanding in this matter. Dependence and abuse for these drugs will be monitored closely. A Controlled substance policy and procedure is on file which allows [facility] to order a urine drug screen test at any visit. Patient understands and agrees with the plan - chlorpheniramine-HYDROcodone (TUSSIONEX PENNKINETIC ER) 10-8 MG/5ML SUER; Take 5 mLs by mouth every 12 (twelve) hours as needed for cough.  Dispense: 70 mL; Refill: 0  General Counseling: Myran verbalizes understanding of the findings of today's phone visit and agrees with plan of treatment. I have discussed any further diagnostic evaluation that may be needed or ordered today. We also reviewed his medications today. he has been encouraged to  call the office with any questions or concerns that should arise related to todays visit.    No orders  of the defined types were placed in this encounter.   Meds ordered this encounter  Medications  . chlorpheniramine-HYDROcodone (TUSSIONEX PENNKINETIC ER) 10-8 MG/5ML SUER    Sig: Take 5 mLs by mouth every 12 (twelve) hours as needed for cough.    Dispense:  70 mL    Refill:  0  . levofloxacin (LEVAQUIN) 500 MG tablet    Sig: Take 1 tablet (500 mg total) by mouth daily.    Dispense:  7 tablet    Refill:  0    Time spent: [place] AGNP-C Internal medicine

## 2019-08-23 ENCOUNTER — Ambulatory Visit
Admission: RE | Admit: 2019-08-23 | Discharge: 2019-08-23 | Disposition: A | Discharge status: Home / Self Care | Payer: BC Managed Care – PPO | Source: Ambulatory Visit | Attending: Adult Health | Admitting: Adult Health

## 2019-08-23 ENCOUNTER — Ambulatory Visit
Admission: RE | Admit: 2019-08-23 | Discharge: 2019-08-23 | Disposition: A | Discharge status: Home / Self Care | Payer: BC Managed Care – PPO | Attending: Adult Health | Admitting: Adult Health

## 2019-08-23 DIAGNOSIS — M2578 Osteophyte, vertebrae: Secondary | ICD-10-CM | POA: Diagnosis not present

## 2019-08-23 DIAGNOSIS — M25559 Pain in unspecified hip: Secondary | ICD-10-CM | POA: Insufficient documentation

## 2019-08-23 DIAGNOSIS — M5440 Lumbago with sciatica, unspecified side: Secondary | ICD-10-CM

## 2019-08-23 DIAGNOSIS — M25551 Pain in right hip: Secondary | ICD-10-CM | POA: Diagnosis not present

## 2019-08-23 DIAGNOSIS — M47816 Spondylosis without myelopathy or radiculopathy, lumbar region: Secondary | ICD-10-CM | POA: Diagnosis not present

## 2019-08-24 ENCOUNTER — Other Ambulatory Visit: Payer: Self-pay

## 2019-08-24 MED ORDER — LEVOTHYROXINE SODIUM 200 MCG PO CAPS: 1.0000 | ORAL_CAPSULE | Freq: Every day | ORAL | 2 refills | Status: DC

## 2019-08-25 ENCOUNTER — Other Ambulatory Visit: Payer: Self-pay | Admitting: Adult Health

## 2019-08-25 DIAGNOSIS — M5440 Lumbago with sciatica, unspecified side: Secondary | ICD-10-CM

## 2019-08-26 NOTE — Progress Notes (Signed)
Xray of LS spine reviewed, ot might need to see ortho

## 2019-08-28 ENCOUNTER — Other Ambulatory Visit: Payer: Self-pay | Admitting: Adult Health

## 2019-08-28 ENCOUNTER — Telehealth: Payer: Self-pay

## 2019-08-28 DIAGNOSIS — J988 Other specified respiratory disorders: Secondary | ICD-10-CM

## 2019-08-28 MED ORDER — LEVOFLOXACIN 500 MG PO TABS: 500.0000 mg | ORAL_TABLET | Freq: Every day | ORAL | 0 refills | Status: DC

## 2019-08-28 NOTE — Progress Notes (Signed)
Patient has been advised of x ray results, he will discuss with pain management physician and call us if he needs anything further. beth

## 2019-08-28 NOTE — Progress Notes (Signed)
He continues to have cough, sent 5 more days of levaquin.  Will need CXR if further symptoms at completion.

## 2019-08-28 NOTE — Telephone Encounter (Signed)
Patient has been advised of x ray results, he will discuss with pain management physician and call us if he needs anything further. beth

## 2019-08-29 ENCOUNTER — Other Ambulatory Visit: Payer: Self-pay

## 2019-08-29 MED ORDER — LEVOTHYROXINE SODIUM 200 MCG PO CAPS: 1.0000 | ORAL_CAPSULE | Freq: Every day | ORAL | 2 refills | Status: DC

## 2019-09-04 ENCOUNTER — Other Ambulatory Visit: Payer: Self-pay | Admitting: Internal Medicine

## 2019-09-04 DIAGNOSIS — M5440 Lumbago with sciatica, unspecified side: Secondary | ICD-10-CM

## 2019-09-05 ENCOUNTER — Other Ambulatory Visit: Payer: Self-pay

## 2019-09-05 DIAGNOSIS — E291 Testicular hypofunction: Secondary | ICD-10-CM

## 2019-09-11 ENCOUNTER — Other Ambulatory Visit: Payer: Self-pay

## 2019-09-11 ENCOUNTER — Other Ambulatory Visit: Payer: Self-pay | Admitting: Adult Health

## 2019-09-11 DIAGNOSIS — E291 Testicular hypofunction: Secondary | ICD-10-CM

## 2019-09-11 MED ORDER — TESTOSTERONE CYPIONATE 200 MG/ML IM SOLN: INTRAMUSCULAR | 2 refills | Status: DC

## 2019-09-11 MED ORDER — BUPROPION HCL ER (XL) 300 MG PO TB24: ORAL_TABLET | ORAL | 1 refills | Status: DC

## 2019-09-11 NOTE — Progress Notes (Signed)
Resent RX for testosterone.

## 2019-09-13 ENCOUNTER — Other Ambulatory Visit: Payer: Self-pay

## 2019-09-15 ENCOUNTER — Other Ambulatory Visit: Payer: Self-pay | Admitting: Internal Medicine

## 2019-09-15 DIAGNOSIS — M5440 Lumbago with sciatica, unspecified side: Secondary | ICD-10-CM

## 2019-09-27 ENCOUNTER — Telehealth: Payer: Self-pay

## 2019-09-27 NOTE — Telephone Encounter (Signed)
LMOM for office visit on 8/9

## 2019-10-01 ENCOUNTER — Ambulatory Visit: Payer: BC Managed Care – PPO | Admitting: Adult Health

## 2019-10-01 ENCOUNTER — Other Ambulatory Visit: Payer: Self-pay

## 2019-10-01 VITALS — BP 140/66 | HR 76 | Temp 97.7°F | Resp 16 | Ht 73.0 in | Wt 218.4 lb

## 2019-10-01 DIAGNOSIS — E559 Vitamin D deficiency, unspecified: Secondary | ICD-10-CM

## 2019-10-01 DIAGNOSIS — Z79899 Other long term (current) drug therapy: Secondary | ICD-10-CM

## 2019-10-01 DIAGNOSIS — E039 Hypothyroidism, unspecified: Secondary | ICD-10-CM

## 2019-10-01 DIAGNOSIS — I10 Essential (primary) hypertension: Secondary | ICD-10-CM

## 2019-10-01 DIAGNOSIS — Z125 Encounter for screening for malignant neoplasm of prostate: Secondary | ICD-10-CM

## 2019-10-01 DIAGNOSIS — G47429 Narcolepsy in conditions classified elsewhere without cataplexy: Secondary | ICD-10-CM

## 2019-10-01 DIAGNOSIS — E785 Hyperlipidemia, unspecified: Secondary | ICD-10-CM

## 2019-10-01 DIAGNOSIS — E291 Testicular hypofunction: Secondary | ICD-10-CM

## 2019-10-01 MED ORDER — AMPHETAMINE-DEXTROAMPHETAMINE 20 MG PO TABS: 20.0000 mg | ORAL_TABLET | Freq: Three times a day (TID) | ORAL | 0 refills | Status: DC

## 2019-10-01 NOTE — Progress Notes (Signed)
[facility] [place], [place] [address]  Internal MEDICINE  Office Visit Note  Patient Name: Randy Mclean  [id number]  [id number]  Date of Service: 10/01/2019  Chief Complaint  Patient presents with  . Follow-up    8 week follow-up, check on meds  . Depression  . Hyperlipidemia  . Hypotension  . Quality Metric Gaps    TDAP, HepC, colonoscopy endoscopy    HPI  Pt is here for follow up on medications.  He has a history of depression, ADD and HLD.  Overall he is at his baseline. Denies any current issues.  Is requesting med refills.    Current Medication: Outpatient Encounter Medications as of 10/01/2019  Medication Sig  . albuterol (VENTOLIN HFA) 108 (90 Base) MCG/ACT inhaler Inhale 2 puffs into the lungs every 4 (four) hours as needed for wheezing or shortness of breath.  . alfuzosin (UROXATRAL) 10 MG 24 hr tablet TAKE 1 TABLET BY MOUTH DAILY  . ALPRAZolam (XANAX) 0.5 MG tablet Take 1 tablet (0.5 mg total) by mouth 3 (three) times daily as needed. for anxiety  . amphetamine-dextroamphetamine (ADDERALL) 20 MG tablet Take 1 tablet (20 mg total) by mouth 3 (three) times daily.  Derrill Memo ON 11/01/2019] amphetamine-dextroamphetamine (ADDERALL) 20 MG tablet Take 1 tablet (20 mg total) by mouth in the morning, at noon, and at bedtime.  Marland Kitchen atorvastatin (LIPITOR) 10 MG tablet Take 1 tablet (10 mg total) by mouth daily.  Marland Kitchen azelastine (ASTELIN) 0.1 % nasal spray USE 1 SPRAY NASALLY 2 TIMES A DAY  . buPROPion (WELLBUTRIN XL) 300 MG 24 hr tablet TAKE 1 TABLET BY MOUTH DAILY, GENERIC EQUIVALENT FOR WELLBUTRIN XL. PATIENT DUE FOR OFFICE VISIT.  Marland Kitchen celecoxib (CELEBREX) 200 MG capsule Take 200 mg by mouth 2 (two) times daily.  . chlorpheniramine-HYDROcodone (TUSSIONEX PENNKINETIC ER) 10-8 MG/5ML SUER Take 5 mLs by mouth every 12 (twelve) hours as needed for cough.  . citalopram (CELEXA) 20 MG tablet TAKE 1 TABLET BY MOUTH DAILY  . clotrimazole-betamethasone  (LOTRISONE) cream Apply 1 application topically 2 (two) times daily.  Marland Kitchen esomeprazole (NEXIUM) 40 MG capsule TK ONE C PO  BID UTD  . l-methylfolate-B6-B12 (METANX) 3-35-2 MG TABS Take 1 tablet by mouth 2 (two) times daily.  Marland Kitchen levofloxacin (LEVAQUIN) 500 MG tablet Take 1 tablet (500 mg total) by mouth daily.  . Levothyroxine Sodium (TIROSINT) 200 MCG CAPS Take 1 capsule by mouth daily.  Marland Kitchen losartan (COZAAR) 50 MG tablet Take 1 tablet (50 mg total) by mouth daily.  . meloxicam (MOBIC) 7.5 MG tablet Take 1 tablet (7.5 mg total) by mouth 2 (two) times daily.  . methocarbamol (ROBAXIN) 750 MG tablet Take 750 mg by mouth 4 (four) times daily as needed.  . metoprolol succinate (TOPROL-XL) 25 MG 24 hr tablet TAKE 1 TABLET BY MOUTH DAILY. GENERIC EQUIVALENT FOR TOPROL XL  . mometasone-formoterol (DULERA) 100-5 MCG/ACT AERO INHALE 2 PUFFS BY MOUTH INTO THE LUNGS EVERY 12 HOURS  . Multiple Vitamin (MULTI-VITAMINS) TABS Take by mouth.  Gean Birchwood ER 200 MG TB12 200 mg bid  . oxyCODONE-acetaminophen (PERCOCET) 7.5-325 MG per tablet Take 1 tablet by mouth every 4 (four) hours as needed for pain.   . predniSONE (DELTASONE) 10 MG tablet Use per dose pack  . pregabalin (LYRICA) 50 MG capsule Take 100 mg by mouth 3 (three) times daily.   . SYRINGE-NEEDLE, DISP, 3 ML (MONOJECT 3CC SYR 22GX1") 22G X 1" 3 ML MISC Use as directed.  Marland Kitchen  testosterone cypionate (DEPOTESTOSTERONE CYPIONATE) 200 MG/ML injection INJECT 0.75 MLS (75MG  TOTAL) INTO THE MUSCLE EVERY 7 DAYS AS DIRECTED (Patient taking differently: INJECT 0.75 MLS (150MG  TOTAL) INTO THE MUSCLE EVERY 7 DAYS AS DIRECTED)  . tiZANidine (ZANAFLEX) 2 MG tablet TAKE 1 TABLET(2 MG) BY MOUTH AT BEDTIME  . Vitamin D, Ergocalciferol, (DRISDOL) 1.25 MG (50000 UNIT) CAPS capsule TAKE 1 CAPSULE BY MOUTH EVERY 7 DAYS  . [DISCONTINUED] amphetamine-dextroamphetamine (ADDERALL) 20 MG tablet Take 1 tablet (20 mg total) by mouth in the morning, at noon, and at bedtime.  . [DISCONTINUED]  senna (SENOKOT) 8.6 MG tablet Take by mouth.  Derrill Memo ON 11/30/2019] amphetamine-dextroamphetamine (ADDERALL) 20 MG tablet Take 1 tablet (20 mg total) by mouth in the morning, at noon, and at bedtime.  Marland Kitchen MOVANTIK 25 MG TABS tablet Take 25 mg by mouth daily.   No facility-administered encounter medications on file as of 10/01/2019.    Surgical History: Past Surgical History:  Procedure Laterality Date  . ANTERIOR CERVICAL DISCECTOMY    . APPENDECTOMY    . BACK SURGERY    . COLONOSCOPY  August 2015  . elbow surgery    . HEMORRHOID SURGERY    . LAPAROSCOPIC APPENDECTOMY N/A 12/04/2017   Procedure: APPENDECTOMY LAPAROSCOPIC;  Surgeon: Vickie Epley, MD;  Location: [facility] ORS;  Service: General;  Laterality: N/A;  . LIPOMA EXCISION Right    leg  . 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 SPINAL CORD STIMULATOR IMPLANT    . UPPER GI ENDOSCOPY  August 2015    Medical History: Past Medical History:  Diagnosis Date  . Acute appendicitis 12/04/2017  . Allergy    takes allergy shots  . Anemia   . Arthritis   . Asthma   . Barrett esophagus   . Bronchitis   . Chronic pain   . Colon polyp   . Depression   . GERD (gastroesophageal reflux disease)   . Hemorrhoid   . Hyperlipidemia   . Hypertension   . Hypothyroid   . IBS (irritable bowel syndrome)   . Kidney stone   . MI (myocardial infarction) ([place])    between 2002 and 2006  . Migraine   . Pneumonia   . Seizures ([place])   . Sinus problem   . Spinal cord stimulator status     Family History: Family History  Problem Relation Age of Onset  . Hypertension Mother   . Lung disease Mother   . Heart attack Father   . Diabetes Father     Social History   Socioeconomic History  . Marital status: Married    Spouse name: Not on file  . Number of children: Not on file  . Years of education: Not on file  . Highest education level: Not on file  Occupational History  . Not on file  Tobacco Use  . Smoking status:  Former Smoker    Packs/day: 1.00    Years: 30.00    Pack years: 30.00    Types: Cigarettes    Quit date: 02/22/2005    Years since quitting: 14.6  . Smokeless tobacco: Never Used  Vaping Use  . Vaping Use: Never used  Substance and Sexual Activity  . Alcohol use: Yes    Alcohol/week: 0.0 standard drinks    Comment: occasionally  . Drug use: No    Comment: past  . Sexual activity: Yes  Other Topics Concern  . Not on file  Social History Narrative  .  Not on file   Social Determinants of Health   Financial Resource Strain:   . Difficulty of Paying Living Expenses:   Food Insecurity:   . Worried About Charity fundraiser in the Last Year:   . Arboriculturist in the Last Year:   Transportation Needs:   . Film/video editor (Medical):   Marland Kitchen Lack of Transportation (Non-Medical):   Physical Activity:   . Days of Exercise per Week:   . Minutes of Exercise per Session:   Stress:   . Feeling of Stress :   Social Connections:   . Frequency of Communication with Friends and Family:   . Frequency of Social Gatherings with Friends and Family:   . Attends Religious Services:   . Active Member of Clubs or Organizations:   . Attends Archivist Meetings:   Marland Kitchen Marital Status:   Intimate Partner Violence:   . Fear of Current or Ex-Partner:   . Emotionally Abused:   Marland Kitchen Physically Abused:   . Sexually Abused:       Review of Systems  Constitutional: Negative.  Negative for chills, fatigue and unexpected weight change.  HENT: Negative.  Negative for congestion, rhinorrhea, sneezing and sore throat.   Eyes: Negative for redness.  Respiratory: Negative.  Negative for cough, chest tightness and shortness of breath.   Cardiovascular: Negative.  Negative for chest pain and palpitations.  Gastrointestinal: Negative.  Negative for abdominal pain, constipation, diarrhea, nausea and vomiting.  Endocrine: Negative.   Genitourinary: Negative.  Negative for dysuria and frequency.   Musculoskeletal: Negative.  Negative for arthralgias, back pain, joint swelling and neck pain.  Skin: Negative.  Negative for rash.  Allergic/Immunologic: Negative.   Neurological: Negative.  Negative for tremors and numbness.  Hematological: Negative for adenopathy. Does not bruise/bleed easily.  Psychiatric/Behavioral: Negative.  Negative for behavioral problems, sleep disturbance and suicidal ideas. The patient is not nervous/anxious.     Vital Signs: BP 140/66   Pulse 76   Temp 97.7 F (36.5 C)   Resp 16   Ht 6\' 1"  (1.854 m)   Wt 218 lb 6.4 oz (99.1 kg)   SpO2 92%   BMI 28.81 kg/m    Physical Exam Vitals and nursing note reviewed.  Constitutional:      General: He is not in acute distress.    Appearance: He is well-developed. He is not diaphoretic.  HENT:     Head: Normocephalic and atraumatic.     Mouth/Throat:     Pharynx: No oropharyngeal exudate.  Eyes:     Pupils: Pupils are equal, round, and reactive to light.  Neck:     Thyroid: No thyromegaly.     Vascular: No JVD.     Trachea: No tracheal deviation.  Cardiovascular:     Rate and Rhythm: Normal rate and regular rhythm.     Heart sounds: Normal heart sounds. No murmur heard.  No friction rub. No gallop.   Pulmonary:     Effort: Pulmonary effort is normal. No respiratory distress.     Breath sounds: Normal breath sounds. No wheezing or rales.  Chest:     Chest wall: No tenderness.  Abdominal:     Palpations: Abdomen is soft.     Tenderness: There is no abdominal tenderness. There is no guarding.  Musculoskeletal:        General: Normal range of motion.     Cervical back: Normal range of motion and neck supple.  Lymphadenopathy:  Cervical: No cervical adenopathy.  Skin:    General: Skin is warm and dry.  Neurological:     Mental Status: He is alert and oriented to person, place, and time.     Cranial Nerves: No cranial nerve deficit.  Psychiatric:        Behavior: Behavior normal.         Thought Content: Thought content normal.        Judgment: Judgment normal.    Assessment/Plan: 1. 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 Reviewed risks and possible side effects associated with taking Stimulants. Combination of these drugs with other psychotropic medications could cause dizziness and drowsiness. Pt needs to Monitor symptoms and exercise caution in driving and operating heavy machinery to avoid damages to oneself, to others and to the surroundings. Patient verbalized understanding in this matter. Dependence and abuse for these drugs will be monitored closely. A Controlled substance policy and procedure is on file which allows [facility] to order a urine drug screen test at any visit. Patient understands and agrees with the plan.. - amphetamine-dextroamphetamine (ADDERALL) 20 MG tablet; Take 1 tablet (20 mg total) by mouth in the morning, at noon, and at bedtime.  Dispense: 90 tablet; Refill: 0 - amphetamine-dextroamphetamine (ADDERALL) 20 MG tablet; Take 1 tablet (20 mg total) by mouth in the morning, at noon, and at bedtime.  Dispense: 90 tablet; Refill: 0  2. Acquired hypothyroidism Stable, continue synthroid as directed.  3. High risk medications (not anticoagulants) long-term use - CBC with Differential/Platelet - Lipid Panel With LDL/HDL Ratio - TSH - T4, free - Comprehensive metabolic panel - Testosterone,Free and Total  4. Vitamin D deficiency - Vitamin D (25 hydroxy)  5. Essential hypertension Stable, continue current management.   6. Hyperlipidemia, unspecified hyperlipidemia type Get lipid panel, and follow up with results   7. Screening for prostate cancer - PSA  8. Hypogonadism, testicular Recheck Testosterone level, follow up at physical next month.  General Counseling: zared knoth understanding of the findings of todays visit and agrees with plan of treatment. I have discussed  any further diagnostic evaluation that may be needed or ordered today. We also reviewed his medications today. he has been encouraged to call the office with any questions or concerns that should arise related to todays visit.    Orders Placed This Encounter  Procedures  . CBC with Differential/Platelet  . Lipid Panel With LDL/HDL Ratio  . TSH  . T4, free  . Comprehensive metabolic panel  . PSA  . Testosterone,Free and Total  . Vitamin D (25 hydroxy)    Meds ordered this encounter  Medications  . amphetamine-dextroamphetamine (ADDERALL) 20 MG tablet    Sig: Take 1 tablet (20 mg total) by mouth in the morning, at noon, and at bedtime.    Dispense:  90 tablet    Refill:  0    Do not fill before 11/01/2019  . amphetamine-dextroamphetamine (ADDERALL) 20 MG tablet    Sig: Take 1 tablet (20 mg total) by mouth in the morning, at noon, and at bedtime.    Dispense:  90 tablet    Refill:  0    Do not fill before 11/30/2019    Time spent: 30 Minutes   This patient was seen by Orson Gear AGNP-C in Collaboration with Dr Lavera Guise as a part of collaborative care agreement     Kendell Bane AGNP-C Internal medicine

## 2019-10-06 NOTE — Progress Notes (Signed)
[place]  [phone]) [phone] [phone]) [phone]  ID: Randy Mclean Younger OB: 07-16-1960  MR#: [id number]  JIR#:[id number]  Patient Care Team: Lavera Guise, MD as PCP - General (Internal Medicine) Bary Castilla Forest Gleason, MD (General Surgery) Lavera Guise, MD (Internal Medicine) Lloyd Huger, MD as Consulting Physician (Oncology)  CHIEF COMPLAINT: Iron deficiency anemia.  INTERVAL HISTORY: Patient returns to clinic today for repeat laboratory work, further evaluation, and consideration of additional IV Feraheme.  His weakness and fatigue have improved, but he does not feel back to his baseline.  He has no neurologic complaints.  He denies any recent fevers or illnesses.  He has no chest pain, shortness of breath, cough, or hemoptysis.  He denies any nausea, vomiting, constipation, or diarrhea.  He has no melena or hematochezia.  He has no urinary complaints.  Patient offers no further specific complaints today.  REVIEW OF SYSTEMS:   Review of Systems  Constitutional: Positive for malaise/fatigue. Negative for fever and weight loss.  Respiratory: Negative.  Negative for cough and shortness of breath.   Cardiovascular: Negative.  Negative for chest pain and leg swelling.  Gastrointestinal: Negative.  Negative for abdominal pain, blood in stool and melena.  Genitourinary: Negative.  Negative for hematuria.  Musculoskeletal: Negative.  Negative for back pain and neck pain.  Skin: Negative.  Negative for rash.  Neurological: Positive for weakness. Negative for dizziness, sensory change, focal weakness and headaches.  Psychiatric/Behavioral: Negative.  The patient is not nervous/anxious.     As per HPI. Otherwise, a complete review of systems is negative.  PAST MEDICAL HISTORY: Past Medical History:  Diagnosis Date  . Acute appendicitis 12/04/2017  . Allergy    takes allergy shots  . Anemia   . Arthritis   . Asthma   . Barrett esophagus   .  Bronchitis   . Chronic pain   . Colon polyp   . Depression   . GERD (gastroesophageal reflux disease)   . Hemorrhoid   . Hyperlipidemia   . Hypertension   . Hypothyroid   . IBS (irritable bowel syndrome)   . Kidney stone   . MI (myocardial infarction) ([place])    between 2002 and 2006  . Migraine   . Pneumonia   . Seizures ([place])   . Sinus problem   . Spinal cord stimulator status     PAST SURGICAL HISTORY: Past Surgical History:  Procedure Laterality Date  . ANTERIOR CERVICAL DISCECTOMY    . APPENDECTOMY    . BACK SURGERY    . COLONOSCOPY  August 2015  . elbow surgery    . HEMORRHOID SURGERY    . LAPAROSCOPIC APPENDECTOMY N/A 12/04/2017   Procedure: APPENDECTOMY LAPAROSCOPIC;  Surgeon: Vickie Epley, MD;  Location: [facility] ORS;  Service: General;  Laterality: N/A;  . LIPOMA EXCISION Right    leg  . 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 SPINAL CORD STIMULATOR IMPLANT    . UPPER GI ENDOSCOPY  August 2015    FAMILY HISTORY Family History  Problem Relation Age of Onset  . Hypertension Mother   . Lung disease Mother   . Heart attack Father   . Diabetes Father        ADVANCED DIRECTIVES:    HEALTH MAINTENANCE: Social History   Tobacco Use  . Smoking status: Former Smoker    Packs/day: 1.00    Years: 30.00    Pack years: 30.00    Types: Cigarettes  Quit date: 02/22/2005    Years since quitting: 14.6  . Smokeless tobacco: Never Used  Vaping Use  . Vaping Use: Never used  Substance Use Topics  . Alcohol use: Yes    Alcohol/week: 0.0 standard drinks    Comment: occasionally  . Drug use: No    Comment: past     Colonoscopy:  PAP:  Bone density:  Lipid panel:  Allergies  Allergen Reactions  . Amoxicillin Anaphylaxis    REACTION: unspecified  . Penicillin G Anaphylaxis  . Gabapentin Other (See Comments)    REACTION: anaphylaxis  . Penicillins     REACTION: unspecified  . Adhesive [Tape] Rash    Band-aid    Current Outpatient  Medications  Medication Sig Dispense Refill  . albuterol (VENTOLIN HFA) 108 (90 Base) MCG/ACT inhaler Inhale 2 puffs into the lungs every 4 (four) hours as needed for wheezing or shortness of breath. 8.5 g 3  . alfuzosin (UROXATRAL) 10 MG 24 hr tablet TAKE 1 TABLET BY MOUTH DAILY 90 tablet 3  . ALPRAZolam (XANAX) 0.5 MG tablet Take 1 tablet (0.5 mg total) by mouth 3 (three) times daily as needed. for anxiety 90 tablet 2  . amphetamine-dextroamphetamine (ADDERALL) 20 MG tablet Take 1 tablet (20 mg total) by mouth 3 (three) times daily. 90 tablet 0  . [START ON 11/01/2019] amphetamine-dextroamphetamine (ADDERALL) 20 MG tablet Take 1 tablet (20 mg total) by mouth in the morning, at noon, and at bedtime. 90 tablet 0  . [START ON 11/30/2019] amphetamine-dextroamphetamine (ADDERALL) 20 MG tablet Take 1 tablet (20 mg total) by mouth in the morning, at noon, and at bedtime. 90 tablet 0  . atorvastatin (LIPITOR) 10 MG tablet Take 1 tablet (10 mg total) by mouth daily. 90 tablet 1  . azelastine (ASTELIN) 0.1 % nasal spray USE 1 SPRAY NASALLY 2 TIMES A DAY 60 mL 3  . buPROPion (WELLBUTRIN XL) 300 MG 24 hr tablet TAKE 1 TABLET BY MOUTH DAILY, GENERIC EQUIVALENT FOR WELLBUTRIN XL. PATIENT DUE FOR OFFICE VISIT. 90 tablet 1  . celecoxib (CELEBREX) 200 MG capsule Take 200 mg by mouth 2 (two) times daily.    . chlorpheniramine-HYDROcodone (TUSSIONEX PENNKINETIC ER) 10-8 MG/5ML SUER Take 5 mLs by mouth every 12 (twelve) hours as needed for cough. 70 mL 0  . citalopram (CELEXA) 20 MG tablet TAKE 1 TABLET BY MOUTH DAILY 90 tablet 1  . clotrimazole-betamethasone (LOTRISONE) cream Apply 1 application topically 2 (two) times daily. 45 g 1  . esomeprazole (NEXIUM) 40 MG capsule TK ONE C PO  BID UTD  0  . l-methylfolate-B6-B12 (METANX) 3-35-2 MG TABS Take 1 tablet by mouth 2 (two) times daily.    Marland Kitchen levofloxacin (LEVAQUIN) 500 MG tablet Take 1 tablet (500 mg total) by mouth daily. 5 tablet 0  . Levothyroxine Sodium (TIROSINT)  200 MCG CAPS Take 1 capsule by mouth daily. 90 capsule 2  . losartan (COZAAR) 50 MG tablet Take 1 tablet (50 mg total) by mouth daily. 90 tablet 1  . meloxicam (MOBIC) 7.5 MG tablet Take 1 tablet (7.5 mg total) by mouth 2 (two) times daily. 180 tablet 1  . methocarbamol (ROBAXIN) 750 MG tablet Take 750 mg by mouth 4 (four) times daily as needed.    . metoprolol succinate (TOPROL-XL) 25 MG 24 hr tablet TAKE 1 TABLET BY MOUTH DAILY. GENERIC EQUIVALENT FOR TOPROL XL 90 tablet 1  . mometasone-formoterol (DULERA) 100-5 MCG/ACT AERO INHALE 2 PUFFS BY MOUTH INTO THE LUNGS EVERY 12 HOURS  39 g 3  . MOVANTIK 25 MG TABS tablet Take 25 mg by mouth daily.    . Multiple Vitamin (MULTI-VITAMINS) TABS Take by mouth.    Gean Birchwood ER 200 MG TB12 200 mg bid  0  . oxyCODONE-acetaminophen (PERCOCET) 7.5-325 MG per tablet Take 1 tablet by mouth every 4 (four) hours as needed for pain.     . predniSONE (DELTASONE) 10 MG tablet Use per dose pack 21 tablet 0  . pregabalin (LYRICA) 50 MG capsule Take 100 mg by mouth 3 (three) times daily.     . SYRINGE-NEEDLE, DISP, 3 ML (MONOJECT 3CC SYR 22GX1") 22G X 1" 3 ML MISC Use as directed.    . testosterone cypionate (DEPOTESTOSTERONE CYPIONATE) 200 MG/ML injection INJECT 0.75 MLS (75MG  TOTAL) INTO THE MUSCLE EVERY 7 DAYS AS DIRECTED (Patient taking differently: INJECT 0.75 MLS (150MG  TOTAL) INTO THE MUSCLE EVERY 7 DAYS AS DIRECTED) 10 mL 2  . tiZANidine (ZANAFLEX) 2 MG tablet TAKE 1 TABLET(2 MG) BY MOUTH AT BEDTIME 10 tablet 0  . Vitamin D, Ergocalciferol, (DRISDOL) 1.25 MG (50000 UNIT) CAPS capsule TAKE 1 CAPSULE BY MOUTH EVERY 7 DAYS 12 capsule 1   No current facility-administered medications for this visit.    OBJECTIVE: There were no vitals filed for this visit.   There is no height or weight on file to calculate BMI.    ECOG FS:0 - Asymptomatic  General: Well-developed, well-nourished, no acute distress. Eyes: Pink conjunctiva, anicteric sclera. HEENT: Normocephalic,  moist mucous membranes. Lungs: No audible wheezing or coughing. Heart: Regular rate and rhythm. Abdomen: Soft, nontender, no obvious distention. Musculoskeletal: No edema, cyanosis, or clubbing. Neuro: Alert, answering all questions appropriately. Cranial nerves grossly intact. Skin: No rashes or petechiae noted. Psych: Normal affect.   LAB RESULTS:  Lab Results  Component Value Date   NA 139 05/05/2019   K 3.7 05/05/2019   CL 104 05/05/2019   CO2 28 05/05/2019   GLUCOSE 112 (H) 05/05/2019   BUN 12 05/05/2019   CREATININE 0.99 05/05/2019   CALCIUM 8.4 (L) 05/05/2019   PROT 6.3 10/11/2018   ALBUMIN 4.2 10/11/2018   AST 23 10/11/2018   ALT 24 10/11/2018   ALKPHOS 96 10/11/2018   BILITOT 0.3 10/11/2018   GFRNONAA >60 05/05/2019   GFRAA >60 05/05/2019    Lab Results  Component Value Date   WBC 5.7 10/11/2019   NEUTROABS 2.7 10/11/2019   HGB 12.6 (L) 10/11/2019   HCT 39.2 10/11/2019   MCV 79.7 (L) 10/11/2019   PLT 220 10/11/2019   Lab Results  Component Value Date   IRON 30 (L) 10/11/2019   TIBC 395 10/11/2019   IRONPCTSAT 8 (L) 10/11/2019   Lab Results  Component Value Date   FERRITIN 8 (L) 10/11/2019     STUDIES: No results found.  ASSESSMENT: Iron deficiency anemia.  PLAN:    1.  Iron deficiency anemia: Patient's hemoglobin has significantly improved, but his iron stores remain decreased.  He is also mildly symptomatic.  Proceed with 510 mg of IV Feraheme today.  Return to clinic in 1 week for second infusion and then in 3 months for repeat laboratory work, further evaluation, and continuation of treatment if needed.     2. Family history of colon cancer:  Patient gets routine colonoscopies given his family history of colon cancer, all of which been unrevealing.  Patient reports his last colonoscopy was delayed secondary to Covid, but plan is to have one arranged in the next several  months.  His genetic testing is negative. 3.  Neck pain: Resolved.  Patient  underwent cervical vertebrae fusion surgery in fall 2020.  I spent a total of 30 minutes reviewing chart data, face-to-face evaluation with the patient, counseling and coordination of care as detailed above.   Patient expressed understanding and was in agreement with this plan. He also understands that He can call clinic at any time with any questions, concerns, or complaints.    Lloyd Huger, MD   10/12/2019 1:48 PM

## 2019-10-11 ENCOUNTER — Inpatient Hospital Stay: Payer: BC Managed Care – PPO | Attending: Oncology

## 2019-10-11 ENCOUNTER — Other Ambulatory Visit: Payer: Self-pay

## 2019-10-11 DIAGNOSIS — I252 Old myocardial infarction: Secondary | ICD-10-CM | POA: Diagnosis not present

## 2019-10-11 DIAGNOSIS — E039 Hypothyroidism, unspecified: Secondary | ICD-10-CM | POA: Insufficient documentation

## 2019-10-11 DIAGNOSIS — I10 Essential (primary) hypertension: Secondary | ICD-10-CM | POA: Insufficient documentation

## 2019-10-11 DIAGNOSIS — Z87891 Personal history of nicotine dependence: Secondary | ICD-10-CM | POA: Insufficient documentation

## 2019-10-11 DIAGNOSIS — Z8249 Family history of ischemic heart disease and other diseases of the circulatory system: Secondary | ICD-10-CM | POA: Diagnosis not present

## 2019-10-11 DIAGNOSIS — Z801 Family history of malignant neoplasm of trachea, bronchus and lung: Secondary | ICD-10-CM | POA: Diagnosis not present

## 2019-10-11 DIAGNOSIS — D509 Iron deficiency anemia, unspecified: Secondary | ICD-10-CM | POA: Insufficient documentation

## 2019-10-11 DIAGNOSIS — Z800 Family history of malignant neoplasm of digestive organs: Secondary | ICD-10-CM | POA: Insufficient documentation

## 2019-10-11 DIAGNOSIS — R531 Weakness: Secondary | ICD-10-CM | POA: Insufficient documentation

## 2019-10-11 DIAGNOSIS — Z833 Family history of diabetes mellitus: Secondary | ICD-10-CM | POA: Insufficient documentation

## 2019-10-11 DIAGNOSIS — Z79899 Other long term (current) drug therapy: Secondary | ICD-10-CM | POA: Insufficient documentation

## 2019-10-11 LAB — CBC WITH DIFFERENTIAL/PLATELET
Abs Immature Granulocytes: 0.02 10*3/uL (ref 0.00–0.07)
Basophils Absolute: 0.1 10*3/uL (ref 0.0–0.1)
Basophils Relative: 1 %
Eosinophils Absolute: 0.2 10*3/uL (ref 0.0–0.5)
Eosinophils Relative: 3 %
HCT: 39.2 % (ref 39.0–52.0)
Hemoglobin: 12.6 g/dL — ABNORMAL LOW (ref 13.0–17.0)
Immature Granulocytes: 0 %
Lymphocytes Relative: 34 %
Lymphs Abs: 1.9 10*3/uL (ref 0.7–4.0)
MCH: 25.6 pg — ABNORMAL LOW (ref 26.0–34.0)
MCHC: 32.1 g/dL (ref 30.0–36.0)
MCV: 79.7 fL — ABNORMAL LOW (ref 80.0–100.0)
Monocytes Absolute: 0.8 10*3/uL (ref 0.1–1.0)
Monocytes Relative: 14 %
Neutro Abs: 2.7 10*3/uL (ref 1.7–7.7)
Neutrophils Relative %: 48 %
Platelets: 220 10*3/uL (ref 150–400)
RBC: 4.92 MIL/uL (ref 4.22–5.81)
RDW: 17.7 % — ABNORMAL HIGH (ref 11.5–15.5)
WBC: 5.7 10*3/uL (ref 4.0–10.5)
nRBC: 0 % (ref 0.0–0.2)

## 2019-10-11 LAB — IRON AND TIBC
Iron: 30 ug/dL — ABNORMAL LOW (ref 45–182)
Saturation Ratios: 8 % — ABNORMAL LOW (ref 17.9–39.5)
TIBC: 395 ug/dL (ref 250–450)
UIBC: 365 ug/dL

## 2019-10-11 LAB — FERRITIN: Ferritin: 8 ng/mL — ABNORMAL LOW (ref 24–336)

## 2019-10-12 ENCOUNTER — Inpatient Hospital Stay: Payer: BC Managed Care – PPO

## 2019-10-12 ENCOUNTER — Inpatient Hospital Stay (HOSPITAL_BASED_OUTPATIENT_CLINIC_OR_DEPARTMENT_OTHER): Payer: BC Managed Care – PPO | Admitting: Oncology

## 2019-10-12 ENCOUNTER — Other Ambulatory Visit: Payer: BC Managed Care – PPO

## 2019-10-12 ENCOUNTER — Encounter: Payer: Self-pay | Admitting: Oncology

## 2019-10-12 VITALS — BP 148/80 | HR 65 | Temp 97.3°F

## 2019-10-12 DIAGNOSIS — I10 Essential (primary) hypertension: Secondary | ICD-10-CM | POA: Diagnosis not present

## 2019-10-12 DIAGNOSIS — Z8249 Family history of ischemic heart disease and other diseases of the circulatory system: Secondary | ICD-10-CM | POA: Diagnosis not present

## 2019-10-12 DIAGNOSIS — Z801 Family history of malignant neoplasm of trachea, bronchus and lung: Secondary | ICD-10-CM | POA: Diagnosis not present

## 2019-10-12 DIAGNOSIS — Z800 Family history of malignant neoplasm of digestive organs: Secondary | ICD-10-CM | POA: Diagnosis not present

## 2019-10-12 DIAGNOSIS — D508 Other iron deficiency anemias: Secondary | ICD-10-CM

## 2019-10-12 DIAGNOSIS — R531 Weakness: Secondary | ICD-10-CM | POA: Diagnosis not present

## 2019-10-12 DIAGNOSIS — Z833 Family history of diabetes mellitus: Secondary | ICD-10-CM | POA: Diagnosis not present

## 2019-10-12 DIAGNOSIS — Z87891 Personal history of nicotine dependence: Secondary | ICD-10-CM | POA: Diagnosis not present

## 2019-10-12 DIAGNOSIS — I252 Old myocardial infarction: Secondary | ICD-10-CM | POA: Diagnosis not present

## 2019-10-12 DIAGNOSIS — E039 Hypothyroidism, unspecified: Secondary | ICD-10-CM | POA: Diagnosis not present

## 2019-10-12 DIAGNOSIS — Z79899 Other long term (current) drug therapy: Secondary | ICD-10-CM | POA: Diagnosis not present

## 2019-10-12 DIAGNOSIS — D509 Iron deficiency anemia, unspecified: Secondary | ICD-10-CM | POA: Diagnosis not present

## 2019-10-12 MED ORDER — SODIUM CHLORIDE 0.9 % IV SOLN
510.0000 mg | Freq: Once | INTRAVENOUS | Status: AC
  Administered 2019-10-12: 510 mg via INTRAVENOUS
  Filled 2019-10-12: qty 510

## 2019-10-12 MED ORDER — SODIUM CHLORIDE 0.9 % IV SOLN
Freq: Once | INTRAVENOUS | Status: AC
  Filled 2019-10-12: qty 250

## 2019-10-12 NOTE — Progress Notes (Signed)
Patient states he is in pain today with his back. He rates pain at 5. He also reports he feels very very tired. He states he is having some trouble with his urine output , dealing with starting and stopping. States has an appointment on 9/1 for physical with primary and will discuss there. No other questions or concerns at this time .

## 2019-10-19 ENCOUNTER — Inpatient Hospital Stay: Payer: BC Managed Care – PPO

## 2019-10-19 ENCOUNTER — Other Ambulatory Visit: Payer: Self-pay

## 2019-10-19 VITALS — BP 130/78 | HR 70 | Temp 97.6°F | Resp 18

## 2019-10-19 DIAGNOSIS — R531 Weakness: Secondary | ICD-10-CM | POA: Diagnosis not present

## 2019-10-19 DIAGNOSIS — E039 Hypothyroidism, unspecified: Secondary | ICD-10-CM | POA: Diagnosis not present

## 2019-10-19 DIAGNOSIS — Z87891 Personal history of nicotine dependence: Secondary | ICD-10-CM | POA: Diagnosis not present

## 2019-10-19 DIAGNOSIS — Z833 Family history of diabetes mellitus: Secondary | ICD-10-CM | POA: Diagnosis not present

## 2019-10-19 DIAGNOSIS — I10 Essential (primary) hypertension: Secondary | ICD-10-CM | POA: Diagnosis not present

## 2019-10-19 DIAGNOSIS — D509 Iron deficiency anemia, unspecified: Secondary | ICD-10-CM | POA: Diagnosis not present

## 2019-10-19 DIAGNOSIS — D508 Other iron deficiency anemias: Secondary | ICD-10-CM

## 2019-10-19 DIAGNOSIS — I252 Old myocardial infarction: Secondary | ICD-10-CM | POA: Diagnosis not present

## 2019-10-19 DIAGNOSIS — Z8249 Family history of ischemic heart disease and other diseases of the circulatory system: Secondary | ICD-10-CM | POA: Diagnosis not present

## 2019-10-19 DIAGNOSIS — Z79899 Other long term (current) drug therapy: Secondary | ICD-10-CM | POA: Diagnosis not present

## 2019-10-19 DIAGNOSIS — Z801 Family history of malignant neoplasm of trachea, bronchus and lung: Secondary | ICD-10-CM | POA: Diagnosis not present

## 2019-10-19 DIAGNOSIS — Z800 Family history of malignant neoplasm of digestive organs: Secondary | ICD-10-CM | POA: Diagnosis not present

## 2019-10-19 MED ORDER — SODIUM CHLORIDE 0.9 % IV SOLN
510.0000 mg | Freq: Once | INTRAVENOUS | Status: AC
  Administered 2019-10-19: 510 mg via INTRAVENOUS
  Filled 2019-10-19: qty 510

## 2019-10-19 MED ORDER — SODIUM CHLORIDE 0.9 % IV SOLN
Freq: Once | INTRAVENOUS | Status: AC
  Filled 2019-10-19: qty 250

## 2019-10-25 ENCOUNTER — Encounter: Payer: BC Managed Care – PPO | Admitting: Hospice and Palliative Medicine

## 2019-11-01 ENCOUNTER — Other Ambulatory Visit: Payer: Self-pay | Admitting: Adult Health

## 2019-11-01 DIAGNOSIS — Z79899 Other long term (current) drug therapy: Secondary | ICD-10-CM

## 2019-11-01 NOTE — Progress Notes (Signed)
Physical labs reordered for patient.  [facility] error.

## 2019-11-12 ENCOUNTER — Encounter: Payer: BC Managed Care – PPO | Admitting: Adult Health

## 2019-11-15 DIAGNOSIS — Z79891 Long term (current) use of opiate analgesic: Secondary | ICD-10-CM | POA: Diagnosis not present

## 2019-11-15 DIAGNOSIS — G894 Chronic pain syndrome: Secondary | ICD-10-CM | POA: Diagnosis not present

## 2019-11-15 DIAGNOSIS — M961 Postlaminectomy syndrome, not elsewhere classified: Secondary | ICD-10-CM | POA: Diagnosis not present

## 2019-11-15 DIAGNOSIS — M4722 Other spondylosis with radiculopathy, cervical region: Secondary | ICD-10-CM | POA: Diagnosis not present

## 2019-11-19 ENCOUNTER — Other Ambulatory Visit: Payer: Self-pay

## 2019-11-19 ENCOUNTER — Encounter: Payer: Self-pay | Admitting: Adult Health

## 2019-11-19 ENCOUNTER — Ambulatory Visit (INDEPENDENT_AMBULATORY_CARE_PROVIDER_SITE_OTHER): Payer: BC Managed Care – PPO | Admitting: Adult Health

## 2019-11-19 VITALS — BP 134/68 | HR 74 | Temp 98.0°F | Resp 16 | Ht 72.0 in | Wt 216.6 lb

## 2019-11-19 DIAGNOSIS — R300 Dysuria: Secondary | ICD-10-CM | POA: Diagnosis not present

## 2019-11-19 DIAGNOSIS — F339 Major depressive disorder, recurrent, unspecified: Secondary | ICD-10-CM

## 2019-11-19 DIAGNOSIS — Z0001 Encounter for general adult medical examination with abnormal findings: Secondary | ICD-10-CM

## 2019-11-19 DIAGNOSIS — F410 Panic disorder [episodic paroxysmal anxiety] without agoraphobia: Secondary | ICD-10-CM

## 2019-11-19 DIAGNOSIS — G47429 Narcolepsy in conditions classified elsewhere without cataplexy: Secondary | ICD-10-CM | POA: Diagnosis not present

## 2019-11-19 DIAGNOSIS — E559 Vitamin D deficiency, unspecified: Secondary | ICD-10-CM

## 2019-11-19 DIAGNOSIS — E039 Hypothyroidism, unspecified: Secondary | ICD-10-CM

## 2019-11-19 DIAGNOSIS — F411 Generalized anxiety disorder: Secondary | ICD-10-CM

## 2019-11-19 DIAGNOSIS — E291 Testicular hypofunction: Secondary | ICD-10-CM

## 2019-11-19 DIAGNOSIS — Z23 Encounter for immunization: Secondary | ICD-10-CM

## 2019-11-19 MED ORDER — AMPHETAMINE-DEXTROAMPHETAMINE 20 MG PO TABS: 20.0000 mg | ORAL_TABLET | Freq: Three times a day (TID) | ORAL | 0 refills | Status: DC

## 2019-11-19 MED ORDER — ALPRAZOLAM 0.5 MG PO TABS: 0.5000 mg | ORAL_TABLET | Freq: Three times a day (TID) | ORAL | 1 refills | Status: DC | PRN

## 2019-11-19 NOTE — Progress Notes (Signed)
[facility] [place], [place] [address]  Internal MEDICINE  Office Visit Note  Patient Name: Randy Mclean Younger  [id number]  [id number]  Date of Service: 11/29/2019  Chief Complaint  Patient presents with  . Annual Exam    bp  . Depression  . Hyperlipidemia  . Hypertension  . Quality Metric Gaps    HepC, TDAP      HPI Pt is here for routine health maintenance examination.  He is a well appearing 59 yo male.  He has a history that includes HLD, HTN, depression, narcolepsy, anxiety and hypogonadism. He reports he has been in his usual state of health.  Denies any issues with his blood pressure or depression.   Current Medication: Outpatient Encounter Medications as of 11/19/2019  Medication Sig  . albuterol (VENTOLIN HFA) 108 (90 Base) MCG/ACT inhaler Inhale 2 puffs into the lungs every 4 (four) hours as needed for wheezing or shortness of breath.  . alfuzosin (UROXATRAL) 10 MG 24 hr tablet TAKE 1 TABLET BY MOUTH DAILY  . ALPRAZolam (XANAX) 0.5 MG tablet Take 1 tablet (0.5 mg total) by mouth 3 (three) times daily as needed. for anxiety  . [START ON 11/30/2019] amphetamine-dextroamphetamine (ADDERALL) 20 MG tablet Take 1 tablet (20 mg total) by mouth in the morning, at noon, and at bedtime.  Derrill Memo ON 12/31/2019] amphetamine-dextroamphetamine (ADDERALL) 20 MG tablet Take 1 tablet (20 mg total) by mouth in the morning, at noon, and at bedtime.  Marland Kitchen atorvastatin (LIPITOR) 10 MG tablet Take 1 tablet (10 mg total) by mouth daily.  Marland Kitchen azelastine (ASTELIN) 0.1 % nasal spray USE 1 SPRAY NASALLY 2 TIMES A DAY  . buPROPion (WELLBUTRIN XL) 300 MG 24 hr tablet TAKE 1 TABLET BY MOUTH DAILY, GENERIC EQUIVALENT FOR WELLBUTRIN XL. PATIENT DUE FOR OFFICE VISIT.  Marland Kitchen celecoxib (CELEBREX) 200 MG capsule Take 200 mg by mouth 2 (two) times daily.  . chlorpheniramine-HYDROcodone (TUSSIONEX PENNKINETIC ER) 10-8 MG/5ML SUER Take 5 mLs by mouth every 12 (twelve) hours as needed for  cough.  . citalopram (CELEXA) 20 MG tablet TAKE 1 TABLET BY MOUTH DAILY  . clotrimazole-betamethasone (LOTRISONE) cream Apply 1 application topically 2 (two) times daily.  Marland Kitchen esomeprazole (NEXIUM) 40 MG capsule TK ONE C PO  BID UTD  . l-methylfolate-B6-B12 (METANX) 3-35-2 MG TABS Take 1 tablet by mouth 2 (two) times daily.  Marland Kitchen levofloxacin (LEVAQUIN) 500 MG tablet Take 1 tablet (500 mg total) by mouth daily.  Marland Kitchen losartan (COZAAR) 50 MG tablet Take 1 tablet (50 mg total) by mouth daily.  . meloxicam (MOBIC) 7.5 MG tablet Take 1 tablet (7.5 mg total) by mouth 2 (two) times daily.  . methocarbamol (ROBAXIN) 750 MG tablet Take 750 mg by mouth 4 (four) times daily as needed.  . metoprolol succinate (TOPROL-XL) 25 MG 24 hr tablet TAKE 1 TABLET BY MOUTH DAILY. GENERIC EQUIVALENT FOR TOPROL XL  . mometasone-formoterol (DULERA) 100-5 MCG/ACT AERO INHALE 2 PUFFS BY MOUTH INTO THE LUNGS EVERY 12 HOURS  . MOVANTIK 25 MG TABS tablet Take 25 mg by mouth daily.  . Multiple Vitamin (MULTI-VITAMINS) TABS Take by mouth.  Gean Birchwood ER 200 MG TB12 200 mg bid  . oxyCODONE-acetaminophen (PERCOCET) 7.5-325 MG per tablet Take 1 tablet by mouth every 4 (four) hours as needed for pain.   . predniSONE (DELTASONE) 10 MG tablet Use per dose pack  . pregabalin (LYRICA) 50 MG capsule Take 100 mg by mouth 3 (three) times daily.   Marland Kitchen  SYRINGE-NEEDLE, DISP, 3 ML (MONOJECT 3CC SYR 22GX1") 22G X 1" 3 ML MISC Use as directed.  . testosterone cypionate (DEPOTESTOSTERONE CYPIONATE) 200 MG/ML injection INJECT 0.75 MLS (75MG  TOTAL) INTO THE MUSCLE EVERY 7 DAYS AS DIRECTED (Patient taking differently: INJECT 0.75 MLS (150MG  TOTAL) INTO THE MUSCLE EVERY 7 DAYS AS DIRECTED)  . tiZANidine (ZANAFLEX) 2 MG tablet TAKE 1 TABLET(2 MG) BY MOUTH AT BEDTIME  . Vitamin D, Ergocalciferol, (DRISDOL) 1.25 MG (50000 UNIT) CAPS capsule TAKE 1 CAPSULE BY MOUTH EVERY 7 DAYS  . [DISCONTINUED] ALPRAZolam (XANAX) 0.5 MG tablet Take 1 tablet (0.5 mg total) by  mouth 3 (three) times daily as needed. for anxiety  . [DISCONTINUED] amphetamine-dextroamphetamine (ADDERALL) 20 MG tablet Take 1 tablet (20 mg total) by mouth 3 (three) times daily.  . [DISCONTINUED] amphetamine-dextroamphetamine (ADDERALL) 20 MG tablet Take 1 tablet (20 mg total) by mouth in the morning, at noon, and at bedtime.  . [DISCONTINUED] Levothyroxine Sodium (TIROSINT) 200 MCG CAPS Take 1 capsule by mouth daily.  Derrill Memo ON 01/30/2020] amphetamine-dextroamphetamine (ADDERALL) 20 MG tablet Take 1 tablet (20 mg total) by mouth 3 (three) times daily.   No facility-administered encounter medications on file as of 11/19/2019.    Surgical History: Past Surgical History:  Procedure Laterality Date  . ANTERIOR CERVICAL DISCECTOMY    . APPENDECTOMY    . BACK SURGERY    . COLONOSCOPY  August 2015  . elbow surgery    . HEMORRHOID SURGERY    . LAPAROSCOPIC APPENDECTOMY N/A 12/04/2017   Procedure: APPENDECTOMY LAPAROSCOPIC;  Surgeon: Vickie Epley, MD;  Location: [facility] ORS;  Service: General;  Laterality: N/A;  . LIPOMA EXCISION Right    leg  . 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 SPINAL CORD STIMULATOR IMPLANT    . UPPER GI ENDOSCOPY  August 2015    Medical History: Past Medical History:  Diagnosis Date  . Acute appendicitis 12/04/2017  . Allergy    takes allergy shots  . Anemia   . Arthritis   . Asthma   . Barrett esophagus   . Bronchitis   . Chronic pain   . Colon polyp   . Depression   . GERD (gastroesophageal reflux disease)   . Hemorrhoid   . Hyperlipidemia   . Hypertension   . Hypothyroid   . IBS (irritable bowel syndrome)   . Kidney stone   . MI (myocardial infarction) ([place])    between 2002 and 2006  . Migraine   . Pneumonia   . Seizures ([place])   . Sinus problem   . Spinal cord stimulator status     Family History: Family History  Problem Relation Age of Onset  . Hypertension Mother   . Lung disease Mother   . Heart attack Father   .  Diabetes Father       Review of Systems  Constitutional: Negative.  Negative for chills, fatigue and unexpected weight change.  HENT: Negative.  Negative for congestion, rhinorrhea, sneezing and sore throat.   Eyes: Negative for redness.  Respiratory: Negative.  Negative for cough, chest tightness and shortness of breath.   Cardiovascular: Negative.  Negative for chest pain and palpitations.  Gastrointestinal: Negative.  Negative for abdominal pain, constipation, diarrhea, nausea and vomiting.  Endocrine: Negative.   Genitourinary: Negative.  Negative for dysuria and frequency.  Musculoskeletal: Negative.  Negative for arthralgias, back pain, joint swelling and neck pain.  Skin: Negative.  Negative for rash.  Allergic/Immunologic: Negative.  Neurological: Negative.  Negative for tremors and numbness.  Hematological: Negative for adenopathy. Does not bruise/bleed easily.  Psychiatric/Behavioral: Negative.  Negative for behavioral problems, sleep disturbance and suicidal ideas. The patient is not nervous/anxious.      Vital Signs: BP 134/68   Pulse 74   Temp 98 F (36.7 C)   Resp 16   Ht 6' (1.829 m)   Wt 216 lb 9.6 oz (98.2 kg)   SpO2 96%   BMI 29.38 kg/m    Physical Exam Vitals and nursing note reviewed.  Constitutional:      General: He is not in acute distress.    Appearance: He is well-developed. He is not diaphoretic.  HENT:     Head: Normocephalic and atraumatic.     Mouth/Throat:     Pharynx: No oropharyngeal exudate.  Eyes:     Pupils: Pupils are equal, round, and reactive to light.  Neck:     Thyroid: No thyromegaly.     Vascular: No JVD.     Trachea: No tracheal deviation.  Cardiovascular:     Rate and Rhythm: Normal rate and regular rhythm.     Heart sounds: Normal heart sounds. No murmur heard.  No friction rub. No gallop.   Pulmonary:     Effort: Pulmonary effort is normal. No respiratory distress.     Breath sounds: Normal breath sounds. No  wheezing or rales.  Chest:     Chest wall: No tenderness.  Abdominal:     Palpations: Abdomen is soft.     Tenderness: There is no abdominal tenderness. There is no guarding.  Musculoskeletal:        General: Normal range of motion.     Cervical back: Normal range of motion and neck supple.  Lymphadenopathy:     Cervical: No cervical adenopathy.  Skin:    General: Skin is warm and dry.  Neurological:     Mental Status: He is alert and oriented to person, place, and time.     Cranial Nerves: No cranial nerve deficit.  Psychiatric:        Behavior: Behavior normal.        Thought Content: Thought content normal.        Judgment: Judgment normal.      LABS: Recent Results (from the past 2160 hour(s))  Ferritin     Status: Abnormal   Collection Time: 10/11/19  2:07 PM  Result Value Ref Range   Ferritin 8 (L) 24 - 336 ng/mL    Comment: Performed at [facility], [address] [place], [place] [address]  CBC with Differential/Platelet     Status: Abnormal   Collection Time: 10/11/19  2:07 PM  Result Value Ref Range   WBC 5.7 4.0 - 10.5 K/uL   RBC 4.92 4.22 - 5.81 MIL/uL   Hemoglobin 12.6 (L) 13.0 - 17.0 g/dL   HCT 39.2 39 - 52 %   MCV 79.7 (L) 80.0 - 100.0 fL   MCH 25.6 (L) 26.0 - 34.0 pg   MCHC 32.1 30.0 - 36.0 g/dL   RDW 17.7 (H) 11.5 - 15.5 %   Platelets 220 150 - 400 K/uL   nRBC 0.0 0.0 - 0.2 %   Neutrophils Relative % 48 %   Neutro Abs 2.7 1.7 - 7.7 K/uL   Lymphocytes Relative 34 %   Lymphs Abs 1.9 0.7 - 4.0 K/uL   Monocytes Relative 14 %   Monocytes Absolute 0.8 0.1 - 1.0 K/uL   Eosinophils  Relative 3 %   Eosinophils Absolute 0.2 0 - 0 K/uL   Basophils Relative 1 %   Basophils Absolute 0.1 0 - 0 K/uL   Immature Granulocytes 0 %   Abs Immature Granulocytes 0.02 0.00 - 0.07 K/uL    Comment: Performed at [facility], [place], [place] [address]  Iron and TIBC     Status: Abnormal   Collection Time: 10/11/19  2:07 PM  Result  Value Ref Range   Iron 30 (L) 45 - 182 ug/dL   TIBC 395 250 - 450 ug/dL   Saturation Ratios 8 (L) 17.9 - 39.5 %   UIBC 365 ug/dL    Comment: Performed at [facility], [address] [place], [place] [address]  UA/M w/rflx Culture, Routine     Status: Abnormal   Collection Time: 11/19/19 11:47 AM   Specimen: Urine   Urine  Result Value Ref Range   Specific Gravity, UA      <=1.005 (A) 1.005 - 1.030   pH, UA 6.0 5.0 - 7.5   Color, UA Yellow Yellow   Appearance Ur Clear Clear   Leukocytes,UA Negative Negative   Protein,UA Negative Negative/Trace   Glucose, UA Negative Negative   Ketones, UA Negative Negative   RBC, UA Negative Negative   Bilirubin, UA Negative Negative   Urobilinogen, Ur 0.2 0.2 - 1.0 mg/dL   Nitrite, UA Negative Negative   Microscopic Examination Comment     Comment: Microscopic follows if indicated.   Microscopic Examination See below:     Comment: Microscopic was indicated and was performed.   Urinalysis Reflex Comment     Comment: This specimen will not reflex to a Urine Culture.  Microscopic Examination     Status: None   Collection Time: 11/19/19 11:47 AM   Urine  Result Value Ref Range   WBC, UA None seen 0 - 5 /hpf   RBC None seen 0 - 2 /hpf   Epithelial Cells (non renal) None seen 0 - 10 /hpf   Casts None seen None seen /lpf   Bacteria, UA None seen None seen/Few  TSH + free T4     Status: Abnormal   Collection Time: 11/21/19  7:22 AM  Result Value Ref Range   TSH 0.012 (L) 0.450 - 4.500 uIU/mL   Free T4 1.33 0.82 - 1.77 ng/dL  Vitamin D (25 hydroxy)     Status: None   Collection Time: 11/21/19  7:22 AM  Result Value Ref Range   Vit D, 25-Hydroxy 38.0 30.0 - 100.0 ng/mL    Comment: Vitamin D deficiency has been defined by the Institute of Medicine and an Endocrine Society practice guideline as a level of serum 25-OH vitamin D less than 20 ng/mL (1,2). The Endocrine Society went on to further define vitamin D insufficiency as a  level between 21 and 29 ng/mL (2). 1. IOM (Institute of Medicine). 2010. Dietary reference    intakes for calcium and D. [place]: The    [employer]. 2. Holick MF, Binkley , Bischoff-Ferrari HA, et al.    Evaluation, treatment, and prevention of vitamin D    deficiency: an Endocrine Society clinical practice    guideline. JCEM. 2011 Jul; 96(7):1911-30.   PSA     Status: None   Collection Time: 11/21/19  7:22 AM  Result Value Ref Range   Prostate Specific Ag, Serum 2.1 0.0 - 4.0 ng/mL    Comment: Roche ECLIA methodology. According to  the American Urological Association, Serum PSA should decrease and remain at undetectable levels after radical prostatectomy. The AUA defines biochemical recurrence as an initial PSA value 0.2 ng/mL or greater followed by a subsequent confirmatory PSA value 0.2 ng/mL or greater. Values obtained with different assay methods or kits cannot be used interchangeably. Results cannot be interpreted as absolute evidence of the presence or absence of malignant disease.   Testosterone,Free and Total     Status: None   Collection Time: 11/21/19  7:22 AM  Result Value Ref Range   Testosterone 575 264 - 916 ng/dL    Comment: Adult male reference interval is based on a population of healthy nonobese males (BMI <30) between 67 and 82 years old. [place], [place] [phone]. PMID: 68127517.    Testosterone, Free 8.4 7.2 - 24.0 pg/mL  Prolactin     Status: None   Collection Time: 11/21/19  7:22 AM  Result Value Ref Range   Prolactin 10.0 4.0 - 15.2 ng/mL  POCT Urine Drug Screen     Status: Abnormal   Collection Time: 11/26/19  4:52 PM  Result Value Ref Range   POC Methamphetamine UR None Detected None Detected   POC Opiate Ur None Detected None Detected   POC Barbiturate UR None Detected None Detected   POC Amphetamine UR Positive (A) None Detected   POC Oxycodone UR Positive (A) None Detected   POC Cocaine UR None Detected None  Detected   POC Ecstasy UR None Detected None Detected   POC TRICYCLICS UR None Detected None Detected   POC PHENCYCLIDINE UR None Detected None Detected   POC Marijuana UR None Detected None Detected   POC Methadone UR None Detected None Detected   POC BENZODIAZEPINES UR Positive (A) None Detected   URINE TEMPERATURE     POC DRUG SCREEN OXIDANTS URINE     POC SPECIFIC GRAVITY URINE     POC PH URINE     Methylenedioxyamphetamine None Detected None Detected    Assessment/Plan: 1. Encounter for general adult medical examination with abnormal findings Up to date on PHM  2. 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 Reviewed risks and possible side effects associated with taking Stimulants. Combination of these drugs with other psychotropic medications could cause dizziness and drowsiness. Pt needs to Monitor symptoms and exercise caution in driving and operating heavy machinery to avoid damages to oneself, to others and to the surroundings. Patient verbalized understanding in this matter. Dependence and abuse for these drugs will be monitored closely. A Controlled substance policy and procedure is on file which allows [facility] to order a urine drug screen test at any visit. Patient understands and agrees with the plan.. - amphetamine-dextroamphetamine (ADDERALL) 20 MG tablet; Take 1 tablet (20 mg total) by mouth in the morning, at noon, and at bedtime.  Dispense: 90 tablet; Refill: 0 - amphetamine-dextroamphetamine (ADDERALL) 20 MG tablet; Take 1 tablet (20 mg total) by mouth 3 (three) times daily.  Dispense: 90 tablet; Refill: 0  3. Depression, recurrent ([place]) No recent issues or complaints.  Continue to monitor.   4. Generalized anxiety disorder with panic attacks Reviewed risks and possible side effects associated with taking opiates, benzodiazepines and other CNS depressants. Combination of these could cause dizziness and  drowsiness. Advised patient not to drive or operate machinery when taking these medications, as patient's and other's life can be at risk and will have consequences. Patient verbalized understanding in this matter. Dependence and abuse  for these drugs will be monitored closely. A Controlled substance policy and procedure is on file which allows [facility] to order a urine drug screen test at any visit. Patient understands and agrees with the plan - ALPRAZolam (XANAX) 0.5 MG tablet; Take 1 tablet (0.5 mg total) by mouth 3 (three) times daily as needed. for anxiety  Dispense: 90 tablet; Refill: 1  5. Hypogonadism, testicular - PSA - Testosterone,Free and Total - Prolactin  6. Acquired hypothyroidism - TSH + free T4  7. Vitamin D deficiency - Vitamin D (25 hydroxy)  8. Flu vaccine need - Flu Vaccine MDCK QUAD PF  9. Dysuria - UA/M w/rflx Culture, Routine  General Counseling: Dyshaun verbalizes understanding of the findings of todays visit and agrees with plan of treatment. I have discussed any further diagnostic evaluation that may be needed or ordered today. We also reviewed his medications today. he has been encouraged to call the office with any questions or concerns that should arise related to todays visit.   Orders Placed This Encounter  Procedures  . Microscopic Examination  . Flu Vaccine MDCK QUAD PF  . UA/M w/rflx Culture, Routine  . TSH + free T4  . Vitamin D (25 hydroxy)  . PSA  . Testosterone,Free and Total  . Prolactin    Meds ordered this encounter  Medications  . amphetamine-dextroamphetamine (ADDERALL) 20 MG tablet    Sig: Take 1 tablet (20 mg total) by mouth in the morning, at noon, and at bedtime.    Dispense:  90 tablet    Refill:  0    Do not fill before 12/31/2019  . amphetamine-dextroamphetamine (ADDERALL) 20 MG tablet    Sig: Take 1 tablet (20 mg total) by mouth 3 (three) times daily.    Dispense:  90 tablet    Refill:  0    Do not fill  before 01/30/2020  . ALPRAZolam (XANAX) 0.5 MG tablet    Sig: Take 1 tablet (0.5 mg total) by mouth 3 (three) times daily as needed. for anxiety    Dispense:  90 tablet    Refill:  1    Time spent: 30  Minutes   This patient was seen by Orson Gear AGNP-C in Collaboration with Dr Lavera Guise as a part of collaborative care agreement    Kendell Bane AGNP-C Internal Medicine

## 2019-11-20 LAB — MICROSCOPIC EXAMINATION
Bacteria, UA: NONE SEEN
Casts: NONE SEEN /lpf
Epithelial Cells (non renal): NONE SEEN /hpf (ref 0–10)
RBC, Urine: NONE SEEN /hpf (ref 0–2)
WBC, UA: NONE SEEN /hpf (ref 0–5)

## 2019-11-20 LAB — UA/M W/RFLX CULTURE, ROUTINE
Bilirubin, UA: NEGATIVE
Glucose, UA: NEGATIVE
Ketones, UA: NEGATIVE
Leukocytes,UA: NEGATIVE
Nitrite, UA: NEGATIVE
Protein,UA: NEGATIVE
RBC, UA: NEGATIVE
Specific Gravity, UA: <=1.005 — AB (ref 1.005–1.030)
Urobilinogen, Ur: 0.2 mg/dL (ref 0.2–1.0)
pH, UA: 6 (ref 5.0–7.5)

## 2019-11-21 DIAGNOSIS — R002 Palpitations: Secondary | ICD-10-CM | POA: Diagnosis not present

## 2019-11-21 DIAGNOSIS — I6523 Occlusion and stenosis of bilateral carotid arteries: Secondary | ICD-10-CM | POA: Diagnosis not present

## 2019-11-21 DIAGNOSIS — E559 Vitamin D deficiency, unspecified: Secondary | ICD-10-CM | POA: Diagnosis not present

## 2019-11-21 DIAGNOSIS — E782 Mixed hyperlipidemia: Secondary | ICD-10-CM | POA: Diagnosis not present

## 2019-11-21 DIAGNOSIS — E039 Hypothyroidism, unspecified: Secondary | ICD-10-CM | POA: Diagnosis not present

## 2019-11-21 DIAGNOSIS — E291 Testicular hypofunction: Secondary | ICD-10-CM | POA: Diagnosis not present

## 2019-11-21 DIAGNOSIS — I10 Essential (primary) hypertension: Secondary | ICD-10-CM | POA: Diagnosis not present

## 2019-11-21 DIAGNOSIS — I208 Other forms of angina pectoris: Secondary | ICD-10-CM | POA: Diagnosis not present

## 2019-11-26 ENCOUNTER — Other Ambulatory Visit: Payer: Self-pay

## 2019-11-26 ENCOUNTER — Ambulatory Visit: Payer: BC Managed Care – PPO | Admitting: Adult Health

## 2019-11-26 ENCOUNTER — Ambulatory Visit (INDEPENDENT_AMBULATORY_CARE_PROVIDER_SITE_OTHER): Payer: BC Managed Care – PPO | Admitting: Adult Health

## 2019-11-26 ENCOUNTER — Encounter: Payer: Self-pay | Admitting: Adult Health

## 2019-11-26 VITALS — BP 110/64 | HR 77 | Temp 98.2°F | Resp 16 | Ht 72.0 in | Wt 216.4 lb

## 2019-11-26 DIAGNOSIS — E039 Hypothyroidism, unspecified: Secondary | ICD-10-CM | POA: Diagnosis not present

## 2019-11-26 DIAGNOSIS — Z79899 Other long term (current) drug therapy: Secondary | ICD-10-CM | POA: Diagnosis not present

## 2019-11-26 LAB — POCT URINE DRUG SCREEN
Methylenedioxyamphetamine: NOT DETECTED
POC Amphetamine UR: POSITIVE — AB
POC BENZODIAZEPINES UR: POSITIVE — AB
POC Barbiturate UR: NOT DETECTED
POC Cocaine UR: NOT DETECTED
POC Ecstasy UR: NOT DETECTED
POC Marijuana UR: NOT DETECTED
POC Methadone UR: NOT DETECTED
POC Methamphetamine UR: NOT DETECTED
POC Opiate Ur: NOT DETECTED
POC Oxycodone UR: POSITIVE — AB
POC PHENCYCLIDINE UR: NOT DETECTED
POC TRICYCLICS UR: NOT DETECTED

## 2019-11-26 LAB — VITAMIN D 25 HYDROXY (VIT D DEFICIENCY, FRACTURES): Vit D, 25-Hydroxy: 38 ng/mL (ref 30.0–100.0)

## 2019-11-26 LAB — TESTOSTERONE,FREE AND TOTAL
Testosterone, Free: 8.4 pg/mL (ref 7.2–24.0)
Testosterone: 575 ng/dL (ref 264–916)

## 2019-11-26 LAB — TSH+FREE T4
Free T4: 1.33 ng/dL (ref 0.82–1.77)
TSH: 0.012 u[IU]/mL — ABNORMAL LOW (ref 0.450–4.500)

## 2019-11-26 LAB — PROLACTIN: Prolactin: 10 ng/mL (ref 4.0–15.2)

## 2019-11-26 LAB — PSA: Prostate Specific Ag, Serum: 2.1 ng/mL (ref 0.0–4.0)

## 2019-11-26 MED ORDER — LEVOTHYROXINE SODIUM 150 MCG PO TABS
150.0000 ug | ORAL_TABLET | Freq: Every day | ORAL | 1 refills | Status: DC
Start: 2019-11-26 — End: 2020-01-07

## 2019-11-26 NOTE — Progress Notes (Signed)
[facility] [place], [place] [address]  Internal MEDICINE  Office Visit Note  Patient Name: Randy Mclean Younger  [id number]  [id number]  Date of Service: 11/26/2019  Chief Complaint  Patient presents with  . Follow-up    review labs  . Depression  . Hypertension  . Hyperlipidemia  . Quality Metric Gaps    HepC, TDAP    HPI  Pt is here for follow up on labs.  His testosterone, prolactin and psa are WNL.  His TSH remains decreased, and his Free T4 is normal at this time.    Current Medication: Outpatient Encounter Medications as of 11/26/2019  Medication Sig  . albuterol (VENTOLIN HFA) 108 (90 Base) MCG/ACT inhaler Inhale 2 puffs into the lungs every 4 (four) hours as needed for wheezing or shortness of breath.  . alfuzosin (UROXATRAL) 10 MG 24 hr tablet TAKE 1 TABLET BY MOUTH DAILY  . ALPRAZolam (XANAX) 0.5 MG tablet Take 1 tablet (0.5 mg total) by mouth 3 (three) times daily as needed. for anxiety  . [START ON 11/30/2019] amphetamine-dextroamphetamine (ADDERALL) 20 MG tablet Take 1 tablet (20 mg total) by mouth in the morning, at noon, and at bedtime.  Derrill Memo ON 12/31/2019] amphetamine-dextroamphetamine (ADDERALL) 20 MG tablet Take 1 tablet (20 mg total) by mouth in the morning, at noon, and at bedtime.  Derrill Memo ON 01/30/2020] amphetamine-dextroamphetamine (ADDERALL) 20 MG tablet Take 1 tablet (20 mg total) by mouth 3 (three) times daily.  Marland Kitchen atorvastatin (LIPITOR) 10 MG tablet Take 1 tablet (10 mg total) by mouth daily.  Marland Kitchen azelastine (ASTELIN) 0.1 % nasal spray USE 1 SPRAY NASALLY 2 TIMES A DAY  . buPROPion (WELLBUTRIN XL) 300 MG 24 hr tablet TAKE 1 TABLET BY MOUTH DAILY, GENERIC EQUIVALENT FOR WELLBUTRIN XL. PATIENT DUE FOR OFFICE VISIT.  Marland Kitchen celecoxib (CELEBREX) 200 MG capsule Take 200 mg by mouth 2 (two) times daily.  . chlorpheniramine-HYDROcodone (TUSSIONEX PENNKINETIC ER) 10-8 MG/5ML SUER Take 5 mLs by mouth every 12 (twelve) hours as needed for  cough.  . citalopram (CELEXA) 20 MG tablet TAKE 1 TABLET BY MOUTH DAILY  . clotrimazole-betamethasone (LOTRISONE) cream Apply 1 application topically 2 (two) times daily.  Marland Kitchen esomeprazole (NEXIUM) 40 MG capsule TK ONE C PO  BID UTD  . l-methylfolate-B6-B12 (METANX) 3-35-2 MG TABS Take 1 tablet by mouth 2 (two) times daily.  Marland Kitchen levofloxacin (LEVAQUIN) 500 MG tablet Take 1 tablet (500 mg total) by mouth daily.  . Levothyroxine Sodium (TIROSINT) 200 MCG CAPS Take 1 capsule by mouth daily.  Marland Kitchen losartan (COZAAR) 50 MG tablet Take 1 tablet (50 mg total) by mouth daily.  . meloxicam (MOBIC) 7.5 MG tablet Take 1 tablet (7.5 mg total) by mouth 2 (two) times daily.  . methocarbamol (ROBAXIN) 750 MG tablet Take 750 mg by mouth 4 (four) times daily as needed.  . metoprolol succinate (TOPROL-XL) 25 MG 24 hr tablet TAKE 1 TABLET BY MOUTH DAILY. GENERIC EQUIVALENT FOR TOPROL XL  . mometasone-formoterol (DULERA) 100-5 MCG/ACT AERO INHALE 2 PUFFS BY MOUTH INTO THE LUNGS EVERY 12 HOURS  . MOVANTIK 25 MG TABS tablet Take 25 mg by mouth daily.  . Multiple Vitamin (MULTI-VITAMINS) TABS Take by mouth.  Gean Birchwood ER 200 MG TB12 200 mg bid  . oxyCODONE-acetaminophen (PERCOCET) 7.5-325 MG per tablet Take 1 tablet by mouth every 4 (four) hours as needed for pain.   . predniSONE (DELTASONE) 10 MG tablet Use per dose pack  . pregabalin (LYRICA)  50 MG capsule Take 100 mg by mouth 3 (three) times daily.   . SYRINGE-NEEDLE, DISP, 3 ML (MONOJECT 3CC SYR 22GX1") 22G X 1" 3 ML MISC Use as directed.  . testosterone cypionate (DEPOTESTOSTERONE CYPIONATE) 200 MG/ML injection INJECT 0.75 MLS (75MG  TOTAL) INTO THE MUSCLE EVERY 7 DAYS AS DIRECTED (Patient taking differently: INJECT 0.75 MLS (150MG  TOTAL) INTO THE MUSCLE EVERY 7 DAYS AS DIRECTED)  . tiZANidine (ZANAFLEX) 2 MG tablet TAKE 1 TABLET(2 MG) BY MOUTH AT BEDTIME  . Vitamin D, Ergocalciferol, (DRISDOL) 1.25 MG (50000 UNIT) CAPS capsule TAKE 1 CAPSULE BY MOUTH EVERY 7 DAYS   No  facility-administered encounter medications on file as of 11/26/2019.    Surgical History: Past Surgical History:  Procedure Laterality Date  . ANTERIOR CERVICAL DISCECTOMY    . APPENDECTOMY    . BACK SURGERY    . COLONOSCOPY  August 2015  . elbow surgery    . HEMORRHOID SURGERY    . LAPAROSCOPIC APPENDECTOMY N/A 12/04/2017   Procedure: APPENDECTOMY LAPAROSCOPIC;  Surgeon: Vickie Epley, MD;  Location: [facility] ORS;  Service: General;  Laterality: N/A;  . LIPOMA EXCISION Right    leg  . 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 SPINAL CORD STIMULATOR IMPLANT    . UPPER GI ENDOSCOPY  August 2015    Medical History: Past Medical History:  Diagnosis Date  . Acute appendicitis 12/04/2017  . Allergy    takes allergy shots  . Anemia   . Arthritis   . Asthma   . Barrett esophagus   . Bronchitis   . Chronic pain   . Colon polyp   . Depression   . GERD (gastroesophageal reflux disease)   . Hemorrhoid   . Hyperlipidemia   . Hypertension   . Hypothyroid   . IBS (irritable bowel syndrome)   . Kidney stone   . MI (myocardial infarction) ([place])    between 2002 and 2006  . Migraine   . Pneumonia   . Seizures ([place])   . Sinus problem   . Spinal cord stimulator status     Family History: Family History  Problem Relation Age of Onset  . Hypertension Mother   . Lung disease Mother   . Heart attack Father   . Diabetes Father     Social History   Socioeconomic History  . Marital status: Married    Spouse name: Not on file  . Number of children: Not on file  . Years of education: Not on file  . Highest education level: Not on file  Occupational History  . Not on file  Tobacco Use  . Smoking status: Former Smoker    Packs/day: 1.00    Years: 30.00    Pack years: 30.00    Types: Cigarettes    Quit date: 02/22/2005    Years since quitting: 14.7  . Smokeless tobacco: Never Used  Vaping Use  . Vaping Use: Never used  Substance and Sexual Activity  . Alcohol  use: Yes    Alcohol/week: 0.0 standard drinks    Comment: occasionally  . Drug use: No    Comment: past  . Sexual activity: Yes  Other Topics Concern  . Not on file  Social History Narrative  . Not on file   Social Determinants of Health   Financial Resource Strain:   . Difficulty of Paying Living Expenses: Not on file  Food Insecurity:   . Worried About Charity fundraiser in the Last  Year: Not on file  . Ran Out of Food in the Last Year: Not on file  Transportation Needs:   . Lack of Transportation (Medical): Not on file  . Lack of Transportation (Non-Medical): Not on file  Physical Activity:   . Days of Exercise per Week: Not on file  . Minutes of Exercise per Session: Not on file  Stress:   . Feeling of Stress : Not on file  Social Connections:   . Frequency of Communication with Friends and Family: Not on file  . Frequency of Social Gatherings with Friends and Family: Not on file  . Attends Religious Services: Not on file  . Active Member of Clubs or Organizations: Not on file  . Attends Archivist Meetings: Not on file  . Marital Status: Not on file  Intimate Partner Violence:   . Fear of Current or Ex-Partner: Not on file  . Emotionally Abused: Not on file  . Physically Abused: Not on file  . Sexually Abused: Not on file      Review of Systems  Constitutional: Negative.  Negative for chills, fatigue and unexpected weight change.  HENT: Negative.  Negative for congestion, rhinorrhea, sneezing and sore throat.   Eyes: Negative for redness.  Respiratory: Negative.  Negative for cough, chest tightness and shortness of breath.   Cardiovascular: Negative.  Negative for chest pain and palpitations.  Gastrointestinal: Negative.  Negative for abdominal pain, constipation, diarrhea, nausea and vomiting.  Endocrine: Negative.   Genitourinary: Negative.  Negative for dysuria and frequency.  Musculoskeletal: Negative.  Negative for arthralgias, back pain, joint  swelling and neck pain.  Skin: Negative.  Negative for rash.  Allergic/Immunologic: Negative.   Neurological: Negative.  Negative for tremors and numbness.  Hematological: Negative for adenopathy. Does not bruise/bleed easily.  Psychiatric/Behavioral: Negative.  Negative for behavioral problems, sleep disturbance and suicidal ideas. The patient is not nervous/anxious.     Vital Signs: BP 110/64   Pulse 77   Temp 98.2 F (36.8 C)   Resp 16   Ht 6' (1.829 m)   Wt 216 lb 6.4 oz (98.2 kg)   SpO2 97%   BMI 29.35 kg/m    Physical Exam Vitals and nursing note reviewed.  Constitutional:      General: He is not in acute distress.    Appearance: He is well-developed. He is not diaphoretic.  HENT:     Head: Normocephalic and atraumatic.     Mouth/Throat:     Pharynx: No oropharyngeal exudate.  Eyes:     Pupils: Pupils are equal, round, and reactive to light.  Neck:     Thyroid: No thyromegaly.     Vascular: No JVD.     Trachea: No tracheal deviation.  Cardiovascular:     Rate and Rhythm: Normal rate and regular rhythm.     Heart sounds: Normal heart sounds. No murmur heard.  No friction rub. No gallop.   Pulmonary:     Effort: Pulmonary effort is normal. No respiratory distress.     Breath sounds: Normal breath sounds. No wheezing or rales.  Chest:     Chest wall: No tenderness.  Abdominal:     Palpations: Abdomen is soft.     Tenderness: There is no abdominal tenderness. There is no guarding.  Musculoskeletal:        General: Normal range of motion.     Cervical back: Normal range of motion and neck supple.  Lymphadenopathy:     Cervical: No cervical  adenopathy.  Skin:    General: Skin is warm and dry.  Neurological:     Mental Status: He is alert and oriented to person, place, and time.     Cranial Nerves: No cranial nerve deficit.  Psychiatric:        Behavior: Behavior normal.        Thought Content: Thought content normal.        Judgment: Judgment normal.       Assessment/Plan: 1. Acquired hypothyroidism Decreased TSH, decreased levothyroxine to 121mcg at this visit.  Will recheck level in 6-8 weeks.   2. Encounter for long-term (current) use of high-risk medication - POCT Urine Drug Screen  General Counseling: Dodd verbalizes understanding of the findings of todays visit and agrees with plan of treatment. I have discussed any further diagnostic evaluation that may be needed or ordered today. We also reviewed his medications today. he has been encouraged to call the office with any questions or concerns that should arise related to todays visit.    Orders Placed This Encounter  Procedures  . POCT Urine Drug Screen    No orders of the defined types were placed in this encounter.   Time spent: 30 Minutes   This patient was seen by Orson Gear AGNP-C in Collaboration with Dr Lavera Guise as a part of collaborative care agreement     Kendell Bane AGNP-C Internal medicine

## 2019-11-27 ENCOUNTER — Encounter: Payer: Self-pay | Admitting: Internal Medicine

## 2019-12-06 DIAGNOSIS — H35033 Hypertensive retinopathy, bilateral: Secondary | ICD-10-CM | POA: Diagnosis not present

## 2019-12-14 DIAGNOSIS — E782 Mixed hyperlipidemia: Secondary | ICD-10-CM | POA: Diagnosis not present

## 2019-12-14 DIAGNOSIS — I6523 Occlusion and stenosis of bilateral carotid arteries: Secondary | ICD-10-CM | POA: Diagnosis not present

## 2019-12-14 DIAGNOSIS — I208 Other forms of angina pectoris: Secondary | ICD-10-CM | POA: Diagnosis not present

## 2019-12-14 DIAGNOSIS — I10 Essential (primary) hypertension: Secondary | ICD-10-CM | POA: Diagnosis not present

## 2020-01-03 ENCOUNTER — Other Ambulatory Visit: Payer: Self-pay | Admitting: Oncology

## 2020-01-03 ENCOUNTER — Telehealth: Payer: Self-pay

## 2020-01-06 NOTE — Progress Notes (Signed)
[place]  Telephone:([phone]) [phone] Fax:(336) [phone]  ID: RYSON BACHA Younger OB: 28-Dec-1960  MR#: [id number]  DDU#:[id number]  Patient Care Team: Lavera Guise, MD as PCP - General (Internal Medicine) Bary Castilla Forest Gleason, MD (General Surgery) Lavera Guise, MD (Internal Medicine) Lloyd Huger, MD as Consulting Physician (Oncology)  CHIEF COMPLAINT: Iron deficiency anemia.  INTERVAL HISTORY: Patient returns to clinic today for repeat laboratory work, further evaluation, and continuation of IV Feraheme.  He has noticed some mild increased weakness and fatigue, but otherwise feels well.  He has no neurologic complaints.  He denies any recent fevers or illnesses.  He has no chest pain, shortness of breath, cough, or hemoptysis.  He denies any nausea, vomiting, constipation, or diarrhea.  He has no melena or hematochezia.  He has no urinary complaints.  Patient offers no further specific complaints today.  REVIEW OF SYSTEMS:   Review of Systems  Constitutional: Positive for malaise/fatigue. Negative for fever and weight loss.  Respiratory: Negative.  Negative for cough and shortness of breath.   Cardiovascular: Negative.  Negative for chest pain and leg swelling.  Gastrointestinal: Negative.  Negative for abdominal pain, blood in stool and melena.  Genitourinary: Negative.  Negative for hematuria.  Musculoskeletal: Negative.  Negative for back pain and neck pain.  Skin: Negative.  Negative for rash.  Neurological: Positive for weakness. Negative for dizziness, sensory change, focal weakness and headaches.  Psychiatric/Behavioral: Negative.  The patient is not nervous/anxious.     As per HPI. Otherwise, a complete review of systems is negative.  PAST MEDICAL HISTORY: Past Medical History:  Diagnosis Date  . Acute appendicitis 12/04/2017  . Allergy    takes allergy shots  . Anemia   . Arthritis   . Asthma   . Barrett esophagus   . Bronchitis   .  Chronic pain   . Colon polyp   . Depression   . GERD (gastroesophageal reflux disease)   . Hemorrhoid   . Hyperlipidemia   . Hypertension   . Hypothyroid   . IBS (irritable bowel syndrome)   . Kidney stone   . MI (myocardial infarction) ([place])    between 2002 and 2006  . Migraine   . Pneumonia   . Seizures ([place])   . Sinus problem   . Spinal cord stimulator status     PAST SURGICAL HISTORY: Past Surgical History:  Procedure Laterality Date  . ANTERIOR CERVICAL DISCECTOMY    . APPENDECTOMY    . BACK SURGERY    . COLONOSCOPY  August 2015  . elbow surgery    . HEMORRHOID SURGERY    . LAPAROSCOPIC APPENDECTOMY N/A 12/04/2017   Procedure: APPENDECTOMY LAPAROSCOPIC;  Surgeon: Vickie Epley, MD;  Location: [facility] ORS;  Service: General;  Laterality: N/A;  . LIPOMA EXCISION Right    leg  . 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 SPINAL CORD STIMULATOR IMPLANT    . UPPER GI ENDOSCOPY  August 2015    FAMILY HISTORY Family History  Problem Relation Age of Onset  . Hypertension Mother   . Lung disease Mother   . Heart attack Father   . Diabetes Father        ADVANCED DIRECTIVES:    HEALTH MAINTENANCE: Social History   Tobacco Use  . Smoking status: Former Smoker    Packs/day: 1.00    Years: 30.00    Pack years: 30.00    Types: Cigarettes    Quit date:  02/22/2005    Years since quitting: 14.8  . Smokeless tobacco: Never Used  Vaping Use  . Vaping Use: Never used  Substance Use Topics  . Alcohol use: Yes    Alcohol/week: 0.0 standard drinks    Comment: occasionally  . Drug use: No    Comment: past     Colonoscopy:  PAP:  Bone density:  Lipid panel:  Allergies  Allergen Reactions  . Amoxicillin Anaphylaxis    REACTION: unspecified  . Penicillin G Anaphylaxis  . Gabapentin Other (See Comments)    REACTION: anaphylaxis  . Penicillins     REACTION: unspecified  . Adhesive [Tape] Rash    Band-aid    Current Outpatient Medications   Medication Sig Dispense Refill  . albuterol (VENTOLIN HFA) 108 (90 Base) MCG/ACT inhaler Inhale 2 puffs into the lungs every 4 (four) hours as needed for wheezing or shortness of breath. 8.5 g 3  . alfuzosin (UROXATRAL) 10 MG 24 hr tablet TAKE 1 TABLET BY MOUTH DAILY 90 tablet 3  . ALPRAZolam (XANAX) 0.5 MG tablet Take 1 tablet (0.5 mg total) by mouth 3 (three) times daily as needed. for anxiety 90 tablet 1  . amLODipine (NORVASC) 5 MG tablet Take 10 mg by mouth daily.    Marland Kitchen amphetamine-dextroamphetamine (ADDERALL) 20 MG tablet Take 1 tablet (20 mg total) by mouth in the morning, at noon, and at bedtime. 90 tablet 0  . amphetamine-dextroamphetamine (ADDERALL) 20 MG tablet Take 1 tablet (20 mg total) by mouth in the morning, at noon, and at bedtime. 90 tablet 0  . [START ON 01/30/2020] amphetamine-dextroamphetamine (ADDERALL) 20 MG tablet Take 1 tablet (20 mg total) by mouth 3 (three) times daily. 90 tablet 0  . atorvastatin (LIPITOR) 10 MG tablet Take 1 tablet (10 mg total) by mouth daily. 90 tablet 1  . azelastine (ASTELIN) 0.1 % nasal spray USE 1 SPRAY NASALLY 2 TIMES A DAY 60 mL 3  . buPROPion (WELLBUTRIN XL) 300 MG 24 hr tablet TAKE 1 TABLET BY MOUTH DAILY, GENERIC EQUIVALENT FOR WELLBUTRIN XL. PATIENT DUE FOR OFFICE VISIT. 90 tablet 1  . citalopram (CELEXA) 20 MG tablet TAKE 1 TABLET BY MOUTH DAILY 90 tablet 1  . esomeprazole (NEXIUM) 40 MG capsule TK ONE C PO  BID UTD  0  . l-methylfolate-B6-B12 (METANX) 3-35-2 MG TABS Take 1 tablet by mouth 2 (two) times daily.    Marland Kitchen levothyroxine (SYNTHROID) 150 MCG tablet Take 1 tablet (150 mcg total) by mouth daily before breakfast. 30 tablet 1  . losartan (COZAAR) 50 MG tablet Take 1 tablet (50 mg total) by mouth daily. (Patient taking differently: Take 50 mg by mouth in the morning and at bedtime. ) 90 tablet 1  . meloxicam (MOBIC) 7.5 MG tablet Take 1 tablet (7.5 mg total) by mouth 2 (two) times daily. 180 tablet 1  . methocarbamol (ROBAXIN) 750 MG  tablet Take 750 mg by mouth 4 (four) times daily as needed.    . metoprolol succinate (TOPROL-XL) 25 MG 24 hr tablet TAKE 1 TABLET BY MOUTH DAILY. GENERIC EQUIVALENT FOR TOPROL XL 90 tablet 1  . mometasone-formoterol (DULERA) 100-5 MCG/ACT AERO INHALE 2 PUFFS BY MOUTH INTO THE LUNGS EVERY 12 HOURS 39 g 3  . MOVANTIK 25 MG TABS tablet Take 25 mg by mouth daily.    . Multiple Vitamin (MULTI-VITAMINS) TABS Take by mouth.    Gean Birchwood ER 200 MG TB12 200 mg bid  0  . oxyCODONE-acetaminophen (PERCOCET) 7.5-325 MG per tablet  Take 1 tablet by mouth every 4 (four) hours as needed for pain.     . pregabalin (LYRICA) 50 MG capsule Take 100 mg by mouth 3 (three) times daily.     . SYRINGE-NEEDLE, DISP, 3 ML (MONOJECT 3CC SYR 22GX1") 22G X 1" 3 ML MISC Use as directed.    . testosterone cypionate (DEPOTESTOSTERONE CYPIONATE) 200 MG/ML injection INJECT 0.75 MLS (75MG  TOTAL) INTO THE MUSCLE EVERY 7 DAYS AS DIRECTED (Patient taking differently: INJECT 0.75 MLS (150MG  TOTAL) INTO THE MUSCLE EVERY 7 DAYS AS DIRECTED) 10 mL 2  . tiZANidine (ZANAFLEX) 2 MG tablet TAKE 1 TABLET(2 MG) BY MOUTH AT BEDTIME (Patient taking differently: 4 mg. ) 10 tablet 0  . Vitamin D, Ergocalciferol, (DRISDOL) 1.25 MG (50000 UNIT) CAPS capsule TAKE 1 CAPSULE BY MOUTH EVERY 7 DAYS 12 capsule 1  . celecoxib (CELEBREX) 200 MG capsule Take 200 mg by mouth 2 (two) times daily.    . chlorpheniramine-HYDROcodone (TUSSIONEX PENNKINETIC ER) 10-8 MG/5ML SUER Take 5 mLs by mouth every 12 (twelve) hours as needed for cough. 70 mL 0  . clotrimazole-betamethasone (LOTRISONE) cream Apply 1 application topically 2 (two) times daily. 45 g 1  . levofloxacin (LEVAQUIN) 500 MG tablet Take 1 tablet (500 mg total) by mouth daily. 5 tablet 0  . predniSONE (DELTASONE) 10 MG tablet Use per dose pack 21 tablet 0   No current facility-administered medications for this visit.    OBJECTIVE: Vitals:   01/11/20 1314  BP: (!) 152/78  Pulse: 72  Resp: 20  Temp:  97.8 F (36.6 C)  SpO2: 97%     Body mass index is 28.54 kg/m.    ECOG FS:0 - Asymptomatic   General: Well-developed, well-nourished, no acute distress. Eyes: Pink conjunctiva, anicteric sclera. HEENT: Normocephalic, moist mucous membranes. Lungs: No audible wheezing or coughing. Heart: Regular rate and rhythm. Abdomen: Soft, nontender, no obvious distention. Musculoskeletal: No edema, cyanosis, or clubbing. Neuro: Alert, answering all questions appropriately. Cranial nerves grossly intact. Skin: No rashes or petechiae noted. Psych: Normal affect.   LAB RESULTS:  Lab Results  Component Value Date   NA 139 05/05/2019   K 3.7 05/05/2019   CL 104 05/05/2019   CO2 28 05/05/2019   GLUCOSE 112 (H) 05/05/2019   BUN 12 05/05/2019   CREATININE 0.99 05/05/2019   CALCIUM 8.4 (L) 05/05/2019   PROT 6.3 10/11/2018   ALBUMIN 4.2 10/11/2018   AST 23 10/11/2018   ALT 24 10/11/2018   ALKPHOS 96 10/11/2018   BILITOT 0.3 10/11/2018   GFRNONAA >60 05/05/2019   GFRAA >60 05/05/2019    Lab Results  Component Value Date   WBC 8.9 01/10/2020   NEUTROABS 5.8 01/10/2020   HGB 12.3 (L) 01/10/2020   HCT 38.6 (L) 01/10/2020   MCV 87.1 01/10/2020   PLT 253 01/10/2020   Lab Results  Component Value Date   IRON 52 01/10/2020   TIBC 393 01/10/2020   IRONPCTSAT 13 (L) 01/10/2020   Lab Results  Component Value Date   FERRITIN 12 (L) 01/10/2020     STUDIES: No results found.  ASSESSMENT: Iron deficiency anemia.  PLAN:    1.  Iron deficiency anemia: Patient's hemoglobin has trended down slightly and his iron stores are reduced.  Proceed with 510 mg IV Feraheme today.  He does not require second infusion.  Return to clinic in 3 months with repeat laboratory work, further evaluation, and continuation of treatment if needed.  2. Family history of colon cancer:  Patient gets routine colonoscopies given his family history of colon cancer, all of which been unrevealing.  Patient reports his  last colonoscopy was delayed secondary to Covid, but plan is to have one arranged in the next several months.  His genetic testing is negative. 3.  Neck pain: Resolved.  Patient underwent cervical vertebrae fusion surgery in fall 2020.  I spent a total of 30 minutes reviewing chart data, face-to-face evaluation with the patient, counseling and coordination of care as detailed above.    Patient expressed understanding and was in agreement with this plan. He also understands that He can call clinic at any time with any questions, concerns, or complaints.    Lloyd Huger, MD   01/13/2020 9:37 AM

## 2020-01-07 ENCOUNTER — Ambulatory Visit: Payer: BC Managed Care – PPO | Admitting: Hospice and Palliative Medicine

## 2020-01-07 ENCOUNTER — Other Ambulatory Visit: Payer: Self-pay

## 2020-01-07 MED ORDER — LEVOTHYROXINE SODIUM 150 MCG PO TABS: 150.0000 ug | ORAL_TABLET | Freq: Every day | ORAL | 1 refills | Status: DC

## 2020-01-10 ENCOUNTER — Inpatient Hospital Stay: Payer: BC Managed Care – PPO | Attending: Oncology

## 2020-01-10 DIAGNOSIS — Z79899 Other long term (current) drug therapy: Secondary | ICD-10-CM | POA: Diagnosis not present

## 2020-01-10 DIAGNOSIS — M1990 Unspecified osteoarthritis, unspecified site: Secondary | ICD-10-CM | POA: Diagnosis not present

## 2020-01-10 DIAGNOSIS — Z888 Allergy status to other drugs, medicaments and biological substances status: Secondary | ICD-10-CM | POA: Diagnosis not present

## 2020-01-10 DIAGNOSIS — E039 Hypothyroidism, unspecified: Secondary | ICD-10-CM | POA: Diagnosis not present

## 2020-01-10 DIAGNOSIS — I10 Essential (primary) hypertension: Secondary | ICD-10-CM | POA: Diagnosis not present

## 2020-01-10 DIAGNOSIS — Z7952 Long term (current) use of systemic steroids: Secondary | ICD-10-CM | POA: Insufficient documentation

## 2020-01-10 DIAGNOSIS — D509 Iron deficiency anemia, unspecified: Secondary | ICD-10-CM | POA: Insufficient documentation

## 2020-01-10 DIAGNOSIS — Z8249 Family history of ischemic heart disease and other diseases of the circulatory system: Secondary | ICD-10-CM | POA: Insufficient documentation

## 2020-01-10 DIAGNOSIS — Z800 Family history of malignant neoplasm of digestive organs: Secondary | ICD-10-CM | POA: Insufficient documentation

## 2020-01-10 DIAGNOSIS — Z87442 Personal history of urinary calculi: Secondary | ICD-10-CM | POA: Insufficient documentation

## 2020-01-10 DIAGNOSIS — Z87891 Personal history of nicotine dependence: Secondary | ICD-10-CM | POA: Diagnosis not present

## 2020-01-10 DIAGNOSIS — Z833 Family history of diabetes mellitus: Secondary | ICD-10-CM | POA: Insufficient documentation

## 2020-01-10 DIAGNOSIS — R5383 Other fatigue: Secondary | ICD-10-CM | POA: Diagnosis not present

## 2020-01-10 DIAGNOSIS — Z880 Allergy status to penicillin: Secondary | ICD-10-CM | POA: Diagnosis not present

## 2020-01-10 DIAGNOSIS — Z836 Family history of other diseases of the respiratory system: Secondary | ICD-10-CM | POA: Diagnosis not present

## 2020-01-10 DIAGNOSIS — J45909 Unspecified asthma, uncomplicated: Secondary | ICD-10-CM | POA: Insufficient documentation

## 2020-01-10 DIAGNOSIS — Z8719 Personal history of other diseases of the digestive system: Secondary | ICD-10-CM | POA: Insufficient documentation

## 2020-01-10 DIAGNOSIS — R531 Weakness: Secondary | ICD-10-CM | POA: Insufficient documentation

## 2020-01-10 DIAGNOSIS — I252 Old myocardial infarction: Secondary | ICD-10-CM | POA: Diagnosis not present

## 2020-01-10 DIAGNOSIS — Z9049 Acquired absence of other specified parts of digestive tract: Secondary | ICD-10-CM | POA: Insufficient documentation

## 2020-01-10 LAB — CBC WITH DIFFERENTIAL/PLATELET
Abs Immature Granulocytes: 0.02 10*3/uL (ref 0.00–0.07)
Basophils Absolute: 0.1 10*3/uL (ref 0.0–0.1)
Basophils Relative: 1 %
Eosinophils Absolute: 0.2 10*3/uL (ref 0.0–0.5)
Eosinophils Relative: 2 %
HCT: 38.6 % — ABNORMAL LOW (ref 39.0–52.0)
Hemoglobin: 12.3 g/dL — ABNORMAL LOW (ref 13.0–17.0)
Immature Granulocytes: 0 %
Lymphocytes Relative: 22 %
Lymphs Abs: 2 10*3/uL (ref 0.7–4.0)
MCH: 27.8 pg (ref 26.0–34.0)
MCHC: 31.9 g/dL (ref 30.0–36.0)
MCV: 87.1 fL (ref 80.0–100.0)
Monocytes Absolute: 0.9 10*3/uL (ref 0.1–1.0)
Monocytes Relative: 10 %
Neutro Abs: 5.8 10*3/uL (ref 1.7–7.7)
Neutrophils Relative %: 65 %
Platelets: 253 10*3/uL (ref 150–400)
RBC: 4.43 MIL/uL (ref 4.22–5.81)
RDW: 15.2 % (ref 11.5–15.5)
WBC: 8.9 10*3/uL (ref 4.0–10.5)
nRBC: 0 % (ref 0.0–0.2)

## 2020-01-10 LAB — IRON AND TIBC
Iron: 52 ug/dL (ref 45–182)
Saturation Ratios: 13 % — ABNORMAL LOW (ref 17.9–39.5)
TIBC: 393 ug/dL (ref 250–450)
UIBC: 341 ug/dL

## 2020-01-10 LAB — FERRITIN: Ferritin: 12 ng/mL — ABNORMAL LOW (ref 24–336)

## 2020-01-11 ENCOUNTER — Encounter: Payer: Self-pay | Admitting: Oncology

## 2020-01-11 ENCOUNTER — Inpatient Hospital Stay (HOSPITAL_BASED_OUTPATIENT_CLINIC_OR_DEPARTMENT_OTHER): Payer: BC Managed Care – PPO | Admitting: Oncology

## 2020-01-11 ENCOUNTER — Inpatient Hospital Stay: Payer: BC Managed Care – PPO

## 2020-01-11 VITALS — BP 152/78 | HR 72 | Temp 97.8°F | Resp 20 | Wt 210.4 lb

## 2020-01-11 VITALS — BP 118/72 | HR 66

## 2020-01-11 DIAGNOSIS — M1990 Unspecified osteoarthritis, unspecified site: Secondary | ICD-10-CM | POA: Diagnosis not present

## 2020-01-11 DIAGNOSIS — Z9049 Acquired absence of other specified parts of digestive tract: Secondary | ICD-10-CM | POA: Diagnosis not present

## 2020-01-11 DIAGNOSIS — D509 Iron deficiency anemia, unspecified: Secondary | ICD-10-CM

## 2020-01-11 DIAGNOSIS — Z7952 Long term (current) use of systemic steroids: Secondary | ICD-10-CM | POA: Diagnosis not present

## 2020-01-11 DIAGNOSIS — D508 Other iron deficiency anemias: Secondary | ICD-10-CM

## 2020-01-11 DIAGNOSIS — J45909 Unspecified asthma, uncomplicated: Secondary | ICD-10-CM | POA: Diagnosis not present

## 2020-01-11 DIAGNOSIS — Z888 Allergy status to other drugs, medicaments and biological substances status: Secondary | ICD-10-CM | POA: Diagnosis not present

## 2020-01-11 DIAGNOSIS — Z8719 Personal history of other diseases of the digestive system: Secondary | ICD-10-CM | POA: Diagnosis not present

## 2020-01-11 DIAGNOSIS — I10 Essential (primary) hypertension: Secondary | ICD-10-CM | POA: Diagnosis not present

## 2020-01-11 DIAGNOSIS — E039 Hypothyroidism, unspecified: Secondary | ICD-10-CM | POA: Diagnosis not present

## 2020-01-11 DIAGNOSIS — I252 Old myocardial infarction: Secondary | ICD-10-CM | POA: Diagnosis not present

## 2020-01-11 DIAGNOSIS — Z87891 Personal history of nicotine dependence: Secondary | ICD-10-CM | POA: Diagnosis not present

## 2020-01-11 DIAGNOSIS — R531 Weakness: Secondary | ICD-10-CM | POA: Diagnosis not present

## 2020-01-11 DIAGNOSIS — Z79899 Other long term (current) drug therapy: Secondary | ICD-10-CM | POA: Diagnosis not present

## 2020-01-11 DIAGNOSIS — Z880 Allergy status to penicillin: Secondary | ICD-10-CM | POA: Diagnosis not present

## 2020-01-11 DIAGNOSIS — R5383 Other fatigue: Secondary | ICD-10-CM | POA: Diagnosis not present

## 2020-01-11 DIAGNOSIS — Z87442 Personal history of urinary calculi: Secondary | ICD-10-CM | POA: Diagnosis not present

## 2020-01-11 MED ORDER — SODIUM CHLORIDE 0.9 % IV SOLN
Freq: Once | INTRAVENOUS | Status: AC
  Filled 2020-01-11: qty 250

## 2020-01-11 MED ORDER — SODIUM CHLORIDE 0.9 % IV SOLN: 200.0000 mg | Freq: Once | INTRAVENOUS | Status: DC

## 2020-01-11 MED ORDER — IRON SUCROSE 20 MG/ML IV SOLN
200.0000 mg | Freq: Once | INTRAVENOUS | Status: AC
  Administered 2020-01-11: 200 mg via INTRAVENOUS
  Filled 2020-01-11: qty 10

## 2020-01-11 NOTE — Progress Notes (Signed)
Pt tolerated Venofer infusion well with no complications. VSS. Pt stable for discharge. Pt discharged home.   Kameren Pargas [employer]

## 2020-01-15 DIAGNOSIS — I10 Essential (primary) hypertension: Secondary | ICD-10-CM | POA: Diagnosis not present

## 2020-01-22 ENCOUNTER — Other Ambulatory Visit: Payer: Self-pay | Admitting: Adult Health

## 2020-01-22 DIAGNOSIS — N401 Enlarged prostate with lower urinary tract symptoms: Secondary | ICD-10-CM

## 2020-01-29 DIAGNOSIS — D500 Iron deficiency anemia secondary to blood loss (chronic): Secondary | ICD-10-CM | POA: Diagnosis not present

## 2020-01-29 DIAGNOSIS — Z86010 Personal history of colonic polyps: Secondary | ICD-10-CM | POA: Diagnosis not present

## 2020-01-29 DIAGNOSIS — Z8719 Personal history of other diseases of the digestive system: Secondary | ICD-10-CM | POA: Diagnosis not present

## 2020-01-29 DIAGNOSIS — K625 Hemorrhage of anus and rectum: Secondary | ICD-10-CM | POA: Diagnosis not present

## 2020-01-30 ENCOUNTER — Encounter: Payer: Self-pay | Admitting: Nurse Practitioner

## 2020-01-30 ENCOUNTER — Ambulatory Visit: Payer: BC Managed Care – PPO | Admitting: Nurse Practitioner

## 2020-01-30 ENCOUNTER — Other Ambulatory Visit: Payer: Self-pay

## 2020-01-30 VITALS — BP 119/72 | HR 80 | Temp 97.7°F | Resp 16 | Ht 72.0 in | Wt 216.2 lb

## 2020-01-30 DIAGNOSIS — I10 Essential (primary) hypertension: Secondary | ICD-10-CM

## 2020-01-30 DIAGNOSIS — E039 Hypothyroidism, unspecified: Secondary | ICD-10-CM | POA: Diagnosis not present

## 2020-01-30 DIAGNOSIS — G47429 Narcolepsy in conditions classified elsewhere without cataplexy: Secondary | ICD-10-CM

## 2020-01-30 DIAGNOSIS — E785 Hyperlipidemia, unspecified: Secondary | ICD-10-CM

## 2020-01-30 DIAGNOSIS — F411 Generalized anxiety disorder: Secondary | ICD-10-CM

## 2020-01-30 DIAGNOSIS — F410 Panic disorder [episodic paroxysmal anxiety] without agoraphobia: Secondary | ICD-10-CM

## 2020-01-30 MED ORDER — AMPHETAMINE-DEXTROAMPHETAMINE 20 MG PO TABS: 20.0000 mg | ORAL_TABLET | Freq: Three times a day (TID) | ORAL | 0 refills | Status: DC

## 2020-01-30 MED ORDER — ALPRAZOLAM 0.5 MG PO TABS: 0.5000 mg | ORAL_TABLET | Freq: Three times a day (TID) | ORAL | 1 refills | Status: DC | PRN

## 2020-01-30 NOTE — Progress Notes (Signed)
[facility] [place], [place] [address]  Internal MEDICINE  Office Visit Note  Patient Name: Randy Mclean  [id number]  [id number]  Date of Service: 03/02/2020  Chief Complaint  Patient presents with  . Follow-up    refill request  . Anemia  . Asthma  . Depression  . Gastroesophageal Reflux  . Hyperlipidemia  . Hypertension  . policy update form    received    The patient is here for routine follow up visit.  -at most recent visit, adderall dosing was decreased to 20mg  three times daily. Has done well with this dosing. Does require the TID dosing due to diagnosis of narcolepsy, sleep apnea, and adult ADD. He tolerates the dosing well and denies negative side effects associated with it.  -well managed blood pressure -increased fatigue at times. Mild, chronic anemia -well managed hypothyroid.  -anxiety and depression well managed. He takes citalopram and bupropion every day. Takes alprazolam p to three times daily if needed. Dosing recently decreased from 1mg  to 0.5mg  and he has done well with this dosing change also.       Current Medication: Outpatient Encounter Medications as of 01/30/2020  Medication Sig  . albuterol (VENTOLIN HFA) 108 (90 Base) MCG/ACT inhaler Inhale 2 puffs into the lungs every 4 (four) hours as needed for wheezing or shortness of breath.  . alfuzosin (UROXATRAL) 10 MG 24 hr tablet TAKE 1 TABLET BY MOUTH DAILY  . ALPRAZolam (XANAX) 0.5 MG tablet Take 1 tablet (0.5 mg total) by mouth 3 (three) times daily as needed. for anxiety  . amLODipine (NORVASC) 5 MG tablet Take 10 mg by mouth daily.  Marland Kitchen amphetamine-dextroamphetamine (ADDERALL) 20 MG tablet Take 1 tablet (20 mg total) by mouth 3 (three) times daily.  Marland Kitchen amphetamine-dextroamphetamine (ADDERALL) 20 MG tablet Take 1 tablet (20 mg total) by mouth in the morning, at noon, and at bedtime.  Marland Kitchen amphetamine-dextroamphetamine (ADDERALL) 20 MG tablet Take 1 tablet (20 mg total) by  mouth in the morning, at noon, and at bedtime.  Marland Kitchen atorvastatin (LIPITOR) 10 MG tablet Take 1 tablet (10 mg total) by mouth daily.  Marland Kitchen azelastine (ASTELIN) 0.1 % nasal spray USE 1 SPRAY NASALLY 2 TIMES A DAY  . buPROPion (WELLBUTRIN XL) 300 MG 24 hr tablet TAKE 1 TABLET BY MOUTH DAILY, GENERIC EQUIVALENT FOR WELLBUTRIN XL. PATIENT DUE FOR OFFICE VISIT.  Marland Kitchen citalopram (CELEXA) 20 MG tablet TAKE 1 TABLET BY MOUTH DAILY  . esomeprazole (NEXIUM) 40 MG capsule TK ONE C PO  BID UTD  . l-methylfolate-B6-B12 (METANX) 3-35-2 MG TABS Take 1 tablet by mouth 2 (two) times daily.  Marland Kitchen levothyroxine (SYNTHROID) 150 MCG tablet Take 1 tablet (150 mcg total) by mouth daily before breakfast.  . losartan (COZAAR) 50 MG tablet Take 1 tablet (50 mg total) by mouth daily. (Patient taking differently: Take 50 mg by mouth in the morning and at bedtime. )  . meloxicam (MOBIC) 7.5 MG tablet Take 1 tablet (7.5 mg total) by mouth 2 (two) times daily.  . methocarbamol (ROBAXIN) 750 MG tablet Take 750 mg by mouth 4 (four) times daily as needed.  . metoprolol succinate (TOPROL-XL) 25 MG 24 hr tablet TAKE 1 TABLET BY MOUTH DAILY. GENERIC EQUIVALENT FOR TOPROL XL  . mometasone-formoterol (DULERA) 100-5 MCG/ACT AERO INHALE 2 PUFFS BY MOUTH INTO THE LUNGS EVERY 12 HOURS  . MOVANTIK 25 MG TABS tablet Take 25 mg by mouth daily.  . Multiple Vitamin (MULTI-VITAMINS) TABS Take by mouth.  Marland Kitchen  NUCYNTA ER 200 MG TB12 200 mg bid  . oxyCODONE-acetaminophen (PERCOCET) 7.5-325 MG per tablet Take 1 tablet by mouth every 4 (four) hours as needed for pain.   . pregabalin (LYRICA) 50 MG capsule Take 100 mg by mouth 3 (three) times daily.   . SYRINGE-NEEDLE, DISP, 3 ML (MONOJECT 3CC SYR 22GX1") 22G X 1" 3 ML MISC Use as directed.  . testosterone cypionate (DEPOTESTOSTERONE CYPIONATE) 200 MG/ML injection INJECT 0.75 MLS (75MG  TOTAL) INTO THE MUSCLE EVERY 7 DAYS AS DIRECTED (Patient taking differently: INJECT 0.75 MLS (150MG  TOTAL) INTO THE MUSCLE EVERY 7  DAYS AS DIRECTED)  . tiZANidine (ZANAFLEX) 2 MG tablet TAKE 1 TABLET(2 MG) BY MOUTH AT BEDTIME (Patient taking differently: 4 mg. )  . Vitamin D, Ergocalciferol, (DRISDOL) 1.25 MG (50000 UNIT) CAPS capsule TAKE 1 CAPSULE BY MOUTH EVERY 7 DAYS  . [DISCONTINUED] ALPRAZolam (XANAX) 0.5 MG tablet Take 1 tablet (0.5 mg total) by mouth 3 (three) times daily as needed. for anxiety  . [DISCONTINUED] amphetamine-dextroamphetamine (ADDERALL) 20 MG tablet Take 1 tablet (20 mg total) by mouth in the morning, at noon, and at bedtime.  . [DISCONTINUED] amphetamine-dextroamphetamine (ADDERALL) 20 MG tablet Take 1 tablet (20 mg total) by mouth in the morning, at noon, and at bedtime.  . celecoxib (CELEBREX) 200 MG capsule Take 200 mg by mouth 2 (two) times daily.  . chlorpheniramine-HYDROcodone (TUSSIONEX PENNKINETIC ER) 10-8 MG/5ML SUER Take 5 mLs by mouth every 12 (twelve) hours as needed for cough.  . clotrimazole-betamethasone (LOTRISONE) cream Apply 1 application topically 2 (two) times daily.  Marland Kitchen levofloxacin (LEVAQUIN) 500 MG tablet Take 1 tablet (500 mg total) by mouth daily.  . predniSONE (DELTASONE) 10 MG tablet Use per dose pack   No facility-administered encounter medications on file as of 01/30/2020.    Surgical History: Past Surgical History:  Procedure Laterality Date  . ANTERIOR CERVICAL DISCECTOMY    . APPENDECTOMY    . BACK SURGERY    . COLONOSCOPY  August 2015  . elbow surgery    . HEMORRHOID SURGERY    . LAPAROSCOPIC APPENDECTOMY N/A 12/04/2017   Procedure: APPENDECTOMY LAPAROSCOPIC;  Surgeon: Vickie Epley, MD;  Location: [facility] ORS;  Service: General;  Laterality: N/A;  . LIPOMA EXCISION Right    leg  . 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 SPINAL CORD STIMULATOR IMPLANT    . UPPER GI ENDOSCOPY  August 2015    Medical History: Past Medical History:  Diagnosis Date  . Acute appendicitis 12/04/2017  . Allergy    takes allergy shots  . Anemia   . Arthritis   .  Asthma   . Barrett esophagus   . Bronchitis   . Chronic pain   . Colon polyp   . Depression   . GERD (gastroesophageal reflux disease)   . Hemorrhoid   . Hyperlipidemia   . Hypertension   . Hypothyroid   . IBS (irritable bowel syndrome)   . Kidney stone   . MI (myocardial infarction) ([place])    between 2002 and 2006  . Migraine   . Pneumonia   . Seizures ([place])   . Sinus problem   . Spinal cord stimulator status     Family History: Family History  Problem Relation Age of Onset  . Hypertension Mother   . Lung disease Mother   . Heart attack Father   . Diabetes Father     Social History   Socioeconomic History  . Marital status: Married  Spouse name: Not on file  . Number of children: Not on file  . Years of education: Not on file  . Highest education level: Not on file  Occupational History  . Not on file  Tobacco Use  . Smoking status: Former Smoker    Packs/day: 1.00    Years: 30.00    Pack years: 30.00    Types: Cigarettes    Quit date: 02/22/2005    Years since quitting: 15.0  . Smokeless tobacco: Never Used  Vaping Use  . Vaping Use: Never used  Substance and Sexual Activity  . Alcohol use: Yes    Alcohol/week: 0.0 standard drinks    Comment: occasionally  . Drug use: No    Comment: past  . Sexual activity: Yes  Other Topics Concern  . Not on file  Social History Narrative  . Not on file   Social Determinants of Health   Financial Resource Strain: Not on file  Food Insecurity: Not on file  Transportation Needs: Not on file  Physical Activity: Not on file  Stress: Not on file  Social Connections: Not on file  Intimate Partner Violence: Not on file      Review of Systems  Constitutional: Positive for fatigue. Negative for activity change, appetite change, chills and unexpected weight change.  HENT: Positive for postnasal drip. Negative for congestion, rhinorrhea, sneezing and sore throat.   Respiratory: Negative for cough, chest  tightness, shortness of breath and wheezing.   Cardiovascular: Negative for chest pain and palpitations.  Gastrointestinal: Negative for abdominal pain, constipation, diarrhea, nausea and vomiting.  Endocrine: Negative for cold intolerance, heat intolerance, polydipsia and polyuria.       Thyroid panel well managed.   Musculoskeletal: Negative for arthralgias, back pain, joint swelling and neck pain.       Patient does have chronic pain and sees a pain management provider.   Skin: Negative for rash.  Allergic/Immunologic: Positive for environmental allergies.  Neurological: Negative for dizziness, tremors, numbness and headaches.  Hematological: Negative for adenopathy. Does not bruise/bleed easily.  Psychiatric/Behavioral: Positive for decreased concentration. Negative for behavioral problems (Depression), sleep disturbance and suicidal ideas. The patient is nervous/anxious.     Today's Vitals   01/30/20 1208  BP: 119/72  Pulse: 80  Resp: 16  Temp: 97.7 F (36.5 C)  SpO2: 98%  Weight: 216 lb 3.2 oz (98.1 kg)  Height: 6' (1.829 m)   Body mass index is 29.32 kg/m.  Physical Exam Vitals and nursing note reviewed.  Constitutional:      General: He is not in acute distress.    Appearance: Normal appearance. He is well-developed and well-nourished. He is not diaphoretic.  HENT:     Head: Normocephalic and atraumatic.     Nose: Nose normal.     Mouth/Throat:     Mouth: Oropharynx is clear and moist.     Pharynx: No oropharyngeal exudate.  Eyes:     Extraocular Movements: EOM normal.     Pupils: Pupils are equal, round, and reactive to light.  Neck:     Thyroid: No thyromegaly.     Vascular: No carotid bruit or JVD.     Trachea: No tracheal deviation.  Cardiovascular:     Rate and Rhythm: Normal rate and regular rhythm.     Heart sounds: Normal heart sounds. No murmur heard. No friction rub. No gallop.   Pulmonary:     Effort: Pulmonary effort is normal. No respiratory  distress.  Breath sounds: Normal breath sounds. No wheezing or rales.  Chest:     Chest wall: No tenderness.  Abdominal:     Palpations: Abdomen is soft.  Musculoskeletal:        General: Normal range of motion.     Cervical back: Normal range of motion and neck supple.  Lymphadenopathy:     Cervical: No cervical adenopathy.  Skin:    General: Skin is warm and dry.  Neurological:     Mental Status: He is alert and oriented to person, place, and time.     Cranial Nerves: No cranial nerve deficit.  Psychiatric:        Mood and Affect: Mood and affect and mood normal.        Behavior: Behavior normal.        Thought Content: Thought content normal.        Judgment: Judgment normal.    Assessment/Plan: 1. Essential hypertension Stable. Continue bp medication as prescribed   2. Acquired hypothyroidism Continue levothyroxine as prescribed   3. Hyperlipidemia, unspecified hyperlipidemia type Continue atorvastatin as prescribed   4. Generalized anxiety disorder with panic attacks Continue citalopram and bupropion daily. May continue alprazolam 0.5mg  up to three times daily as needed for acte anxiety. New prescription sent to his pharmacy today.  - ALPRAZolam (XANAX) 0.5 MG tablet; Take 1 tablet (0.5 mg total) by mouth 3 (three) times daily as needed. for anxiety  Dispense: 90 tablet; Refill: 1  5. Narcolepsy due to underlying condition without cataplexy Doing well with reduced dose adderall. May continue to take adderall 20mg  up to three times daily. Two 30 day prescriptions were sent to his pharmacy. Dates are 01/30/2020 and 02/28/2020 - amphetamine-dextroamphetamine (ADDERALL) 20 MG tablet; Take 1 tablet (20 mg total) by mouth in the morning, at noon, and at bedtime.  Dispense: 90 tablet; Refill: 0 - amphetamine-dextroamphetamine (ADDERALL) 20 MG tablet; Take 1 tablet (20 mg total) by mouth in the morning, at noon, and at bedtime.  Dispense: 90 tablet; Refill: 0  General  Counseling: Arlyn verbalizes understanding of the findings of todays visit and agrees with plan of treatment. I have discussed any further diagnostic evaluation that may be needed or ordered today. We also reviewed his medications today. he has been encouraged to call the office with any questions or concerns that should arise related to todays visit.   Refilled Controlled medications today. Reviewed risks and possible side effects associated with taking Stimulants. Combination of these drugs with other psychotropic medications could cause dizziness and drowsiness. Pt needs to Monitor symptoms and exercise caution in driving and operating heavy machinery to avoid damages to oneself, to others and to the surroundings. Patient verbalized understanding in this matter. Dependence and abuse for these drugs will be monitored closely. A Controlled substance policy and procedure is on file which allows [facility] to order a urine drug screen test at any visit. Patient understands and agrees with the plan..  This patient was seen by Leretha Pol FNP Collaboration with Dr Lavera Guise as a part of collaborative care agreement  Meds ordered this encounter  Medications  . ALPRAZolam (XANAX) 0.5 MG tablet    Sig: Take 1 tablet (0.5 mg total) by mouth 3 (three) times daily as needed. for anxiety    Dispense:  90 tablet    Refill:  1    Order Specific Question:   Supervising Provider    Answer:   Lavera Guise [[id number]]  . amphetamine-dextroamphetamine (  ADDERALL) 20 MG tablet    Sig: Take 1 tablet (20 mg total) by mouth in the morning, at noon, and at bedtime.    Dispense:  90 tablet    Refill:  0    Do not fill before 03/28/2020    Order Specific Question:   Supervising Provider    Answer:   Lavera Guise [[id number]]  . amphetamine-dextroamphetamine (ADDERALL) 20 MG tablet    Sig: Take 1 tablet (20 mg total) by mouth in the morning, at noon, and at bedtime.    Dispense:  90 tablet    Refill:  0     Do not fill before 02/28/2020    Order Specific Question:   Supervising Provider    Answer:   Lavera Guise [[id number]]    Total time spent: 30 Minutes   Time spent includes review of chart, medications, test results, and follow up plan with the patient.      Dr Lavera Guise Internal medicine

## 2020-02-01 DIAGNOSIS — Z03818 Encounter for observation for suspected exposure to other biological agents ruled out: Secondary | ICD-10-CM | POA: Diagnosis not present

## 2020-02-01 DIAGNOSIS — Z20822 Contact with and (suspected) exposure to covid-19: Secondary | ICD-10-CM | POA: Diagnosis not present

## 2020-02-07 NOTE — Telephone Encounter (Signed)
Labs were ordered and completed.

## 2020-02-25 DIAGNOSIS — E782 Mixed hyperlipidemia: Secondary | ICD-10-CM | POA: Diagnosis not present

## 2020-02-25 DIAGNOSIS — I6523 Occlusion and stenosis of bilateral carotid arteries: Secondary | ICD-10-CM | POA: Diagnosis not present

## 2020-02-25 DIAGNOSIS — R002 Palpitations: Secondary | ICD-10-CM | POA: Diagnosis not present

## 2020-02-25 DIAGNOSIS — I10 Essential (primary) hypertension: Secondary | ICD-10-CM | POA: Diagnosis not present

## 2020-02-28 ENCOUNTER — Other Ambulatory Visit: Payer: Self-pay | Admitting: Adult Health

## 2020-02-28 DIAGNOSIS — E559 Vitamin D deficiency, unspecified: Secondary | ICD-10-CM

## 2020-03-03 DIAGNOSIS — G894 Chronic pain syndrome: Secondary | ICD-10-CM | POA: Diagnosis not present

## 2020-03-03 DIAGNOSIS — M961 Postlaminectomy syndrome, not elsewhere classified: Secondary | ICD-10-CM | POA: Diagnosis not present

## 2020-03-03 DIAGNOSIS — M4722 Other spondylosis with radiculopathy, cervical region: Secondary | ICD-10-CM | POA: Diagnosis not present

## 2020-03-03 DIAGNOSIS — Z79891 Long term (current) use of opiate analgesic: Secondary | ICD-10-CM | POA: Diagnosis not present

## 2020-03-18 DIAGNOSIS — Z01818 Encounter for other preprocedural examination: Secondary | ICD-10-CM | POA: Diagnosis not present

## 2020-03-21 DIAGNOSIS — K2100 Gastro-esophageal reflux disease with esophagitis, without bleeding: Secondary | ICD-10-CM | POA: Diagnosis not present

## 2020-03-21 DIAGNOSIS — K2270 Barrett's esophagus without dysplasia: Secondary | ICD-10-CM | POA: Diagnosis not present

## 2020-03-21 DIAGNOSIS — K219 Gastro-esophageal reflux disease without esophagitis: Secondary | ICD-10-CM | POA: Diagnosis not present

## 2020-03-21 DIAGNOSIS — D500 Iron deficiency anemia secondary to blood loss (chronic): Secondary | ICD-10-CM | POA: Diagnosis not present

## 2020-03-21 DIAGNOSIS — Z86010 Personal history of colonic polyps: Secondary | ICD-10-CM | POA: Diagnosis not present

## 2020-03-21 DIAGNOSIS — D509 Iron deficiency anemia, unspecified: Secondary | ICD-10-CM | POA: Diagnosis not present

## 2020-03-21 DIAGNOSIS — K641 Second degree hemorrhoids: Secondary | ICD-10-CM | POA: Diagnosis not present

## 2020-03-25 DIAGNOSIS — K625 Hemorrhage of anus and rectum: Secondary | ICD-10-CM | POA: Diagnosis not present

## 2020-03-25 DIAGNOSIS — D500 Iron deficiency anemia secondary to blood loss (chronic): Secondary | ICD-10-CM | POA: Diagnosis not present

## 2020-03-31 ENCOUNTER — Other Ambulatory Visit: Payer: Self-pay | Admitting: Hospice and Palliative Medicine

## 2020-03-31 DIAGNOSIS — F411 Generalized anxiety disorder: Secondary | ICD-10-CM

## 2020-03-31 DIAGNOSIS — F410 Panic disorder [episodic paroxysmal anxiety] without agoraphobia: Secondary | ICD-10-CM

## 2020-03-31 MED ORDER — ALPRAZOLAM 0.5 MG PO TABS: 0.5000 mg | ORAL_TABLET | Freq: Three times a day (TID) | ORAL | 0 refills | Status: DC | PRN

## 2020-04-03 ENCOUNTER — Other Ambulatory Visit: Payer: Self-pay

## 2020-04-03 ENCOUNTER — Telehealth: Payer: Self-pay

## 2020-04-03 NOTE — Telephone Encounter (Signed)
lmom about which dose he taking levothyroxine

## 2020-04-04 ENCOUNTER — Other Ambulatory Visit: Payer: Self-pay | Admitting: Hospice and Palliative Medicine

## 2020-04-04 ENCOUNTER — Other Ambulatory Visit: Payer: Self-pay

## 2020-04-04 MED ORDER — LEVOTHYROXINE SODIUM 150 MCG PO TABS
150.0000 ug | ORAL_TABLET | Freq: Every day | ORAL | 0 refills | Status: DC
Start: 2020-04-04 — End: 2020-04-04

## 2020-04-04 MED ORDER — LEVOTHYROXINE SODIUM 150 MCG PO TABS
150.0000 ug | ORAL_TABLET | Freq: Every day | ORAL | 1 refills | Status: DC
Start: 2020-04-04 — End: 2020-10-08

## 2020-04-10 ENCOUNTER — Inpatient Hospital Stay: Payer: BC Managed Care – PPO | Attending: Oncology

## 2020-04-10 DIAGNOSIS — Z833 Family history of diabetes mellitus: Secondary | ICD-10-CM | POA: Insufficient documentation

## 2020-04-10 DIAGNOSIS — Z87891 Personal history of nicotine dependence: Secondary | ICD-10-CM | POA: Insufficient documentation

## 2020-04-10 DIAGNOSIS — E785 Hyperlipidemia, unspecified: Secondary | ICD-10-CM | POA: Diagnosis not present

## 2020-04-10 DIAGNOSIS — Z800 Family history of malignant neoplasm of digestive organs: Secondary | ICD-10-CM | POA: Diagnosis not present

## 2020-04-10 DIAGNOSIS — Z79899 Other long term (current) drug therapy: Secondary | ICD-10-CM | POA: Diagnosis not present

## 2020-04-10 DIAGNOSIS — I10 Essential (primary) hypertension: Secondary | ICD-10-CM | POA: Insufficient documentation

## 2020-04-10 DIAGNOSIS — Z8249 Family history of ischemic heart disease and other diseases of the circulatory system: Secondary | ICD-10-CM | POA: Diagnosis not present

## 2020-04-10 DIAGNOSIS — Z836 Family history of other diseases of the respiratory system: Secondary | ICD-10-CM | POA: Insufficient documentation

## 2020-04-10 DIAGNOSIS — R5383 Other fatigue: Secondary | ICD-10-CM | POA: Insufficient documentation

## 2020-04-10 DIAGNOSIS — R531 Weakness: Secondary | ICD-10-CM | POA: Diagnosis not present

## 2020-04-10 DIAGNOSIS — D509 Iron deficiency anemia, unspecified: Secondary | ICD-10-CM | POA: Diagnosis not present

## 2020-04-10 LAB — IRON AND TIBC
Iron: 16 ug/dL — ABNORMAL LOW (ref 45–182)
Saturation Ratios: 3 % — ABNORMAL LOW (ref 17.9–39.5)
TIBC: 507 ug/dL — ABNORMAL HIGH (ref 250–450)
UIBC: 491 ug/dL

## 2020-04-10 LAB — CBC WITH DIFFERENTIAL/PLATELET
Abs Immature Granulocytes: 0.02 10*3/uL (ref 0.00–0.07)
Basophils Absolute: 0.1 10*3/uL (ref 0.0–0.1)
Basophils Relative: 1 %
Eosinophils Absolute: 0.1 10*3/uL (ref 0.0–0.5)
Eosinophils Relative: 2 %
HCT: 37.8 % — ABNORMAL LOW (ref 39.0–52.0)
Hemoglobin: 11.8 g/dL — ABNORMAL LOW (ref 13.0–17.0)
Immature Granulocytes: 0 %
Lymphocytes Relative: 28 %
Lymphs Abs: 2.2 10*3/uL (ref 0.7–4.0)
MCH: 25.1 pg — ABNORMAL LOW (ref 26.0–34.0)
MCHC: 31.2 g/dL (ref 30.0–36.0)
MCV: 80.3 fL (ref 80.0–100.0)
Monocytes Absolute: 1 10*3/uL (ref 0.1–1.0)
Monocytes Relative: 12 %
Neutro Abs: 4.6 10*3/uL (ref 1.7–7.7)
Neutrophils Relative %: 57 %
Platelets: 311 10*3/uL (ref 150–400)
RBC: 4.71 MIL/uL (ref 4.22–5.81)
RDW: 14.5 % (ref 11.5–15.5)
WBC: 8 10*3/uL (ref 4.0–10.5)
nRBC: 0 % (ref 0.0–0.2)

## 2020-04-10 LAB — FERRITIN: Ferritin: 5 ng/mL — ABNORMAL LOW (ref 24–336)

## 2020-04-11 ENCOUNTER — Inpatient Hospital Stay (HOSPITAL_BASED_OUTPATIENT_CLINIC_OR_DEPARTMENT_OTHER): Payer: BC Managed Care – PPO | Admitting: Oncology

## 2020-04-11 ENCOUNTER — Inpatient Hospital Stay: Payer: BC Managed Care – PPO

## 2020-04-11 VITALS — BP 138/68 | HR 78 | Resp 18

## 2020-04-11 VITALS — BP 138/78 | HR 75 | Temp 99.2°F | Resp 16 | Wt 222.5 lb

## 2020-04-11 DIAGNOSIS — Z836 Family history of other diseases of the respiratory system: Secondary | ICD-10-CM | POA: Diagnosis not present

## 2020-04-11 DIAGNOSIS — Z79899 Other long term (current) drug therapy: Secondary | ICD-10-CM | POA: Diagnosis not present

## 2020-04-11 DIAGNOSIS — D508 Other iron deficiency anemias: Secondary | ICD-10-CM

## 2020-04-11 DIAGNOSIS — D509 Iron deficiency anemia, unspecified: Secondary | ICD-10-CM

## 2020-04-11 DIAGNOSIS — I10 Essential (primary) hypertension: Secondary | ICD-10-CM | POA: Diagnosis not present

## 2020-04-11 DIAGNOSIS — Z87891 Personal history of nicotine dependence: Secondary | ICD-10-CM | POA: Diagnosis not present

## 2020-04-11 DIAGNOSIS — Z8249 Family history of ischemic heart disease and other diseases of the circulatory system: Secondary | ICD-10-CM | POA: Diagnosis not present

## 2020-04-11 DIAGNOSIS — E785 Hyperlipidemia, unspecified: Secondary | ICD-10-CM | POA: Diagnosis not present

## 2020-04-11 DIAGNOSIS — Z800 Family history of malignant neoplasm of digestive organs: Secondary | ICD-10-CM | POA: Diagnosis not present

## 2020-04-11 DIAGNOSIS — Z833 Family history of diabetes mellitus: Secondary | ICD-10-CM | POA: Diagnosis not present

## 2020-04-11 DIAGNOSIS — R531 Weakness: Secondary | ICD-10-CM | POA: Diagnosis not present

## 2020-04-11 DIAGNOSIS — R5383 Other fatigue: Secondary | ICD-10-CM | POA: Diagnosis not present

## 2020-04-11 MED ORDER — IRON SUCROSE 20 MG/ML IV SOLN
200.0000 mg | Freq: Once | INTRAVENOUS | Status: AC
  Administered 2020-04-11: 200 mg via INTRAVENOUS
  Filled 2020-04-11: qty 10

## 2020-04-11 MED ORDER — SODIUM CHLORIDE 0.9 % IV SOLN
Freq: Once | INTRAVENOUS | Status: AC
Start: 2020-04-11 — End: 2020-04-11
  Filled 2020-04-11: qty 250

## 2020-04-11 MED ORDER — SODIUM CHLORIDE 0.9 % IV SOLN: 200.0000 mg | Freq: Once | INTRAVENOUS | Status: DC

## 2020-04-11 NOTE — Progress Notes (Signed)
Pt received prescribed treatment in clinic, pt stable at d/c. 

## 2020-04-11 NOTE — Progress Notes (Signed)
Randy Mclean  [phone] [phone]) [phone]  ID: Randy Mclean OB: Apr 04, 1960  MR#: [id number]  RSW#:[id number]  Patient Care Team: Lavera Guise, MD as PCP - General (Internal Medicine) Bary Castilla Forest Gleason, MD (General Surgery) Lavera Guise, MD (Internal Medicine) Lloyd Huger, MD as Consulting Physician (Oncology)  CHIEF COMPLAINT: Iron deficiency anemia.  INTERVAL HISTORY: Patient returns to clinic today for repeat laboratory work, further evaluation, and continuation of IV Feraheme. He was last seen in clinic on 01/11/20.   He last received 1 dose of IV venofer on 01/10/21.   Had colonoscopy/egd with Dr. Alice Reichert last month with no evidence for his anemia.  He is scheduled to have a capsule study with GI next week.  He states he is feeling tired and rundown.  He is weak and fatigued easily.  Has cravings for red meat.  Has occasional hemorrhoidal bleeding but none recently. He has no neurologic complaints.  He denies any recent fevers or illnesses.  He has no chest pain, shortness of breath, cough, or hemoptysis.  He denies any nausea, vomiting, constipation, or diarrhea.  He has no melena or hematochezia.  He has no urinary complaints.  Patient offers no further specific complaints today.  REVIEW OF SYSTEMS:   Review of Systems  Constitutional: Positive for malaise/fatigue. Negative for fever and weight loss.  Respiratory: Negative.  Negative for cough and shortness of breath.   Cardiovascular: Negative.  Negative for chest pain and leg swelling.  Gastrointestinal: Negative.  Negative for abdominal pain, blood in stool and melena.  Genitourinary: Negative.  Negative for hematuria.  Musculoskeletal: Negative.  Negative for back pain and neck pain.  Skin: Negative.  Negative for rash.  Neurological: Positive for weakness. Negative for dizziness, sensory change, focal weakness and headaches.  Psychiatric/Behavioral: Negative.  The patient  is not nervous/anxious.     As per HPI. Otherwise, a complete review of systems is negative.  PAST MEDICAL HISTORY: Past Medical History:  Diagnosis Date  . Acute appendicitis 12/04/2017  . Allergy    takes allergy shots  . Anemia   . Arthritis   . Asthma   . Barrett esophagus   . Bronchitis   . Chronic pain   . Colon polyp   . Depression   . GERD (gastroesophageal reflux disease)   . Hemorrhoid   . Hyperlipidemia   . Hypertension   . Hypothyroid   . IBS (irritable bowel syndrome)   . Kidney stone   . MI (myocardial infarction) ([place])    between 2002 and 2006  . Migraine   . Pneumonia   . Seizures ([place])   . Sinus problem   . Spinal cord stimulator status     PAST SURGICAL HISTORY: Past Surgical History:  Procedure Laterality Date  . ANTERIOR CERVICAL DISCECTOMY    . APPENDECTOMY    . BACK SURGERY    . COLONOSCOPY  August 2015  . elbow surgery    . HEMORRHOID SURGERY    . LAPAROSCOPIC APPENDECTOMY N/A 12/04/2017   Procedure: APPENDECTOMY LAPAROSCOPIC;  Surgeon: Vickie Epley, MD;  Location: [facility] ORS;  Service: General;  Laterality: N/A;  . LIPOMA EXCISION Right    leg  . 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 SPINAL CORD STIMULATOR IMPLANT    . UPPER GI ENDOSCOPY  August 2015    FAMILY HISTORY Family History  Problem Relation Age of Onset  . Hypertension Mother   . Lung disease Mother   .  Heart attack Father   . Diabetes Father        ADVANCED DIRECTIVES:    HEALTH MAINTENANCE: Social History   Tobacco Use  . Smoking status: Former Smoker    Packs/day: 1.00    Years: 30.00    Pack years: 30.00    Types: Cigarettes    Quit date: 02/22/2005    Years since quitting: 15.1  . Smokeless tobacco: Never Used  Vaping Use  . Vaping Use: Never used  Substance Use Topics  . Alcohol use: Yes    Alcohol/week: 0.0 standard drinks    Comment: occasionally  . Drug use: No    Comment: past     Colonoscopy:  PAP:  Bone density:  Lipid  panel:  Allergies  Allergen Reactions  . Amoxicillin Anaphylaxis    REACTION: unspecified  . Penicillin G Anaphylaxis  . Gabapentin Other (See Comments)    REACTION: anaphylaxis  . Penicillins     REACTION: unspecified  . Adhesive [Tape] Rash    Band-aid    Current Outpatient Medications  Medication Sig Dispense Refill  . albuterol (VENTOLIN HFA) 108 (90 Base) MCG/ACT inhaler Inhale 2 puffs into the lungs every 4 (four) hours as needed for wheezing or shortness of breath. 8.5 g 3  . alfuzosin (UROXATRAL) 10 MG 24 hr tablet TAKE 1 TABLET BY MOUTH DAILY 90 tablet 3  . ALPRAZolam (XANAX) 0.5 MG tablet Take 1 tablet (0.5 mg total) by mouth 3 (three) times daily as needed. for anxiety 90 tablet 0  . amLODipine (NORVASC) 5 MG tablet Take 10 mg by mouth daily.    Marland Kitchen amphetamine-dextroamphetamine (ADDERALL) 20 MG tablet Take 1 tablet (20 mg total) by mouth 3 (three) times daily. 90 tablet 0  . amphetamine-dextroamphetamine (ADDERALL) 20 MG tablet Take 1 tablet (20 mg total) by mouth in the morning, at noon, and at bedtime. 90 tablet 0  . amphetamine-dextroamphetamine (ADDERALL) 20 MG tablet Take 1 tablet (20 mg total) by mouth in the morning, at noon, and at bedtime. 90 tablet 0  . atorvastatin (LIPITOR) 10 MG tablet Take 1 tablet (10 mg total) by mouth daily. 90 tablet 1  . azelastine (ASTELIN) 0.1 % nasal spray USE 1 SPRAY NASALLY 2 TIMES A DAY 60 mL 3  . buPROPion (WELLBUTRIN XL) 300 MG 24 hr tablet TAKE 1 TABLET BY MOUTH DAILY, GENERIC EQUIVALENT FOR WELLBUTRIN XL. PATIENT DUE FOR OFFICE VISIT. 90 tablet 1  . citalopram (CELEXA) 20 MG tablet TAKE 1 TABLET BY MOUTH DAILY 90 tablet 1  . esomeprazole (NEXIUM) 40 MG capsule TK ONE C PO  BID UTD  0  . l-methylfolate-B6-B12 (METANX) 3-35-2 MG TABS Take 1 tablet by mouth 2 (two) times daily.    Marland Kitchen levothyroxine (SYNTHROID) 150 MCG tablet Take 1 tablet (150 mcg total) by mouth daily before breakfast. 90 tablet 1  . losartan (COZAAR) 50 MG tablet  Take 1 tablet (50 mg total) by mouth daily. (Patient taking differently: Take 50 mg by mouth in the morning and at bedtime. ) 90 tablet 1  . meloxicam (MOBIC) 7.5 MG tablet Take 1 tablet (7.5 mg total) by mouth 2 (two) times daily. 180 tablet 1  . methocarbamol (ROBAXIN) 750 MG tablet Take 750 mg by mouth 4 (four) times daily as needed.    . metoprolol succinate (TOPROL-XL) 25 MG 24 hr tablet TAKE 1 TABLET BY MOUTH DAILY. GENERIC EQUIVALENT FOR TOPROL XL 90 tablet 1  . mometasone-formoterol (DULERA) 100-5 MCG/ACT AERO INHALE 2 PUFFS  BY MOUTH INTO THE LUNGS EVERY 12 HOURS 39 g 3  . MOVANTIK 25 MG TABS tablet Take 25 mg by mouth daily.    . Multiple Vitamin (MULTI-VITAMINS) TABS Take by mouth.    Gean Birchwood ER 200 MG TB12 200 mg bid  0  . oxyCODONE-acetaminophen (PERCOCET) 7.5-325 MG per tablet Take 1 tablet by mouth every 4 (four) hours as needed for pain.     . pregabalin (LYRICA) 50 MG capsule Take 100 mg by mouth 3 (three) times daily.     . SYRINGE-NEEDLE, DISP, 3 ML (MONOJECT 3CC SYR 22GX1") 22G X 1" 3 ML MISC Use as directed.    . testosterone cypionate (DEPOTESTOSTERONE CYPIONATE) 200 MG/ML injection INJECT 0.75 MLS (75MG  TOTAL) INTO THE MUSCLE EVERY 7 DAYS AS DIRECTED (Patient taking differently: INJECT 0.75 MLS (150MG  TOTAL) INTO THE MUSCLE EVERY 7 DAYS AS DIRECTED) 10 mL 2  . tiZANidine (ZANAFLEX) 2 MG tablet TAKE 1 TABLET(2 MG) BY MOUTH AT BEDTIME (Patient taking differently: 4 mg. ) 10 tablet 0  . Vitamin D, Ergocalciferol, (DRISDOL) 1.25 MG (50000 UNIT) CAPS capsule TAKE 1 CAPSULE BY MOUTH EVERY 7 DAYS 12 capsule 1   No current facility-administered medications for this visit.    OBJECTIVE: There were no vitals filed for this visit.   There is no height or weight on file to calculate BMI.    ECOG FS:0 - Asymptomatic   Physical Exam Constitutional:      General: Vital signs are normal.     Appearance: Normal appearance.  HENT:     Head: Normocephalic and atraumatic.  Eyes:      Pupils: Pupils are equal, round, and reactive to light.  Cardiovascular:     Rate and Rhythm: Normal rate and regular rhythm.     Heart sounds: Normal heart sounds. No murmur heard.   Pulmonary:     Effort: Pulmonary effort is normal.     Breath sounds: Normal breath sounds. No wheezing.  Abdominal:     General: Bowel sounds are normal. There is no distension.     Palpations: Abdomen is soft.     Tenderness: There is no abdominal tenderness.  Musculoskeletal:        General: No edema. Normal range of motion.     Cervical back: Normal range of motion.  Skin:    General: Skin is warm and dry.     Findings: No rash.  Neurological:     Mental Status: He is alert and oriented to person, place, and time.  Psychiatric:        Judgment: Judgment normal.      LAB RESULTS:  Lab Results  Component Value Date   NA 139 05/05/2019   K 3.7 05/05/2019   CL 104 05/05/2019   CO2 28 05/05/2019   GLUCOSE 112 (H) 05/05/2019   BUN 12 05/05/2019   CREATININE 0.99 05/05/2019   CALCIUM 8.4 (L) 05/05/2019   PROT 6.3 10/11/2018   ALBUMIN 4.2 10/11/2018   AST 23 10/11/2018   ALT 24 10/11/2018   ALKPHOS 96 10/11/2018   BILITOT 0.3 10/11/2018   GFRNONAA >60 05/05/2019   GFRAA >60 05/05/2019    Lab Results  Component Value Date   WBC 8.0 04/10/2020   NEUTROABS 4.6 04/10/2020   HGB 11.8 (L) 04/10/2020   HCT 37.8 (L) 04/10/2020   MCV 80.3 04/10/2020   PLT 311 04/10/2020   Lab Results  Component Value Date   IRON 16 (L) 04/10/2020   TIBC  507 (H) 04/10/2020   IRONPCTSAT 3 (L) 04/10/2020   Lab Results  Component Value Date   FERRITIN 5 (L) 04/10/2020     STUDIES: No results found.  ASSESSMENT: Iron deficiency anemia.  PLAN:    1.  Iron deficiency anemia:  -Patient has longstanding history of anemia. -He recently had colonoscopy/egd completed which did not show evidence of bleeding. -Has history of internal hemorrhoids status post banding that occasionally bleed.  Suffers  from constipation. -Unable to tolerate oral iron tablets secondary to worsening constipation. -He has been receiving IV Feraheme for several years now.  Most recently switched to IV Venofer secondary to insurance.  -Most recent Venofer was given on 01/11/2020. -Labs from today show a hemoglobin of 11.8, ferritin of 5 and saturation ratio of 3%. -Proceed with IV Venofer x5 doses.  2. Family history of colon cancer:  -Patient gets routine colonoscopies given his family history of colon cancer, all of which been unrevealing. -Patient reports his last colonoscopy was delayed secondary to Covid, but plan is to have one arranged in the next several months.  -His genetic testing is negative.  Disposition: -Proceed with IV Venofer today x5 doses given at least 1 day apart. -RTC in 3 months for repeat labs (ferritin, iron, CBC), MD assessment and possible iron.  Greater than 50% was spent in counseling and coordination of care with this patient including but not limited to discussion of the relevant topics above (See A&P) including, but not limited to diagnosis and management of acute and chronic medical conditions.   Patient expressed understanding and was in agreement with this plan. He also understands that He can call clinic at any time with any questions, concerns, or complaints.    Jacquelin Hawking, NP   04/11/2020 12:52 PM

## 2020-04-14 ENCOUNTER — Inpatient Hospital Stay: Payer: BC Managed Care – PPO

## 2020-04-14 VITALS — BP 126/70 | HR 81 | Temp 98.0°F | Resp 18

## 2020-04-14 DIAGNOSIS — Z800 Family history of malignant neoplasm of digestive organs: Secondary | ICD-10-CM | POA: Diagnosis not present

## 2020-04-14 DIAGNOSIS — Z79899 Other long term (current) drug therapy: Secondary | ICD-10-CM | POA: Diagnosis not present

## 2020-04-14 DIAGNOSIS — D508 Other iron deficiency anemias: Secondary | ICD-10-CM

## 2020-04-14 DIAGNOSIS — Z833 Family history of diabetes mellitus: Secondary | ICD-10-CM | POA: Diagnosis not present

## 2020-04-14 DIAGNOSIS — R531 Weakness: Secondary | ICD-10-CM | POA: Diagnosis not present

## 2020-04-14 DIAGNOSIS — Z87891 Personal history of nicotine dependence: Secondary | ICD-10-CM | POA: Diagnosis not present

## 2020-04-14 DIAGNOSIS — R5383 Other fatigue: Secondary | ICD-10-CM | POA: Diagnosis not present

## 2020-04-14 DIAGNOSIS — Z836 Family history of other diseases of the respiratory system: Secondary | ICD-10-CM | POA: Diagnosis not present

## 2020-04-14 DIAGNOSIS — D509 Iron deficiency anemia, unspecified: Secondary | ICD-10-CM | POA: Diagnosis not present

## 2020-04-14 DIAGNOSIS — Z8249 Family history of ischemic heart disease and other diseases of the circulatory system: Secondary | ICD-10-CM | POA: Diagnosis not present

## 2020-04-14 DIAGNOSIS — E785 Hyperlipidemia, unspecified: Secondary | ICD-10-CM | POA: Diagnosis not present

## 2020-04-14 DIAGNOSIS — I10 Essential (primary) hypertension: Secondary | ICD-10-CM | POA: Diagnosis not present

## 2020-04-14 MED ORDER — SODIUM CHLORIDE 0.9 % IV SOLN
Freq: Once | INTRAVENOUS | Status: AC
  Filled 2020-04-14: qty 250

## 2020-04-14 MED ORDER — SODIUM CHLORIDE 0.9 % IV SOLN: 200.0000 mg | Freq: Once | INTRAVENOUS | Status: DC

## 2020-04-14 MED ORDER — IRON SUCROSE 20 MG/ML IV SOLN
200.0000 mg | Freq: Once | INTRAVENOUS | Status: AC
  Administered 2020-04-14: 200 mg via INTRAVENOUS
  Filled 2020-04-14: qty 10

## 2020-04-14 NOTE — Progress Notes (Signed)
Pt tolerated venofer infusion well today with no problems or complaints. Pt left infusion suite stable and ambulatory.  

## 2020-04-16 ENCOUNTER — Inpatient Hospital Stay: Payer: BC Managed Care – PPO

## 2020-04-16 ENCOUNTER — Other Ambulatory Visit: Payer: Self-pay

## 2020-04-16 VITALS — BP 135/91 | HR 72 | Temp 99.0°F | Resp 16

## 2020-04-16 DIAGNOSIS — D508 Other iron deficiency anemias: Secondary | ICD-10-CM

## 2020-04-16 DIAGNOSIS — Z87891 Personal history of nicotine dependence: Secondary | ICD-10-CM | POA: Diagnosis not present

## 2020-04-16 DIAGNOSIS — R531 Weakness: Secondary | ICD-10-CM | POA: Diagnosis not present

## 2020-04-16 DIAGNOSIS — Z800 Family history of malignant neoplasm of digestive organs: Secondary | ICD-10-CM | POA: Diagnosis not present

## 2020-04-16 DIAGNOSIS — Z836 Family history of other diseases of the respiratory system: Secondary | ICD-10-CM | POA: Diagnosis not present

## 2020-04-16 DIAGNOSIS — Z79899 Other long term (current) drug therapy: Secondary | ICD-10-CM | POA: Diagnosis not present

## 2020-04-16 DIAGNOSIS — I10 Essential (primary) hypertension: Secondary | ICD-10-CM | POA: Diagnosis not present

## 2020-04-16 DIAGNOSIS — Z8249 Family history of ischemic heart disease and other diseases of the circulatory system: Secondary | ICD-10-CM | POA: Diagnosis not present

## 2020-04-16 DIAGNOSIS — R5383 Other fatigue: Secondary | ICD-10-CM | POA: Diagnosis not present

## 2020-04-16 DIAGNOSIS — D509 Iron deficiency anemia, unspecified: Secondary | ICD-10-CM | POA: Diagnosis not present

## 2020-04-16 DIAGNOSIS — Z833 Family history of diabetes mellitus: Secondary | ICD-10-CM | POA: Diagnosis not present

## 2020-04-16 DIAGNOSIS — E785 Hyperlipidemia, unspecified: Secondary | ICD-10-CM | POA: Diagnosis not present

## 2020-04-16 MED ORDER — SODIUM CHLORIDE 0.9 % IV SOLN
Freq: Once | INTRAVENOUS | Status: AC
  Filled 2020-04-16: qty 250

## 2020-04-16 MED ORDER — IRON SUCROSE 20 MG/ML IV SOLN
200.0000 mg | Freq: Once | INTRAVENOUS | Status: AC
  Administered 2020-04-16: 200 mg via INTRAVENOUS
  Filled 2020-04-16: qty 10

## 2020-04-16 MED ORDER — SODIUM CHLORIDE 0.9 % IV SOLN: 200.0000 mg | Freq: Once | INTRAVENOUS | Status: DC

## 2020-04-20 ENCOUNTER — Other Ambulatory Visit: Payer: Self-pay | Admitting: Adult Health

## 2020-04-20 DIAGNOSIS — J988 Other specified respiratory disorders: Secondary | ICD-10-CM

## 2020-04-21 ENCOUNTER — Inpatient Hospital Stay: Payer: BC Managed Care – PPO

## 2020-04-21 ENCOUNTER — Other Ambulatory Visit: Payer: Self-pay

## 2020-04-21 VITALS — BP 145/72 | HR 67 | Temp 98.8°F | Resp 20

## 2020-04-21 DIAGNOSIS — I10 Essential (primary) hypertension: Secondary | ICD-10-CM | POA: Diagnosis not present

## 2020-04-21 DIAGNOSIS — R5383 Other fatigue: Secondary | ICD-10-CM | POA: Diagnosis not present

## 2020-04-21 DIAGNOSIS — D508 Other iron deficiency anemias: Secondary | ICD-10-CM

## 2020-04-21 DIAGNOSIS — Z833 Family history of diabetes mellitus: Secondary | ICD-10-CM | POA: Diagnosis not present

## 2020-04-21 DIAGNOSIS — Z8249 Family history of ischemic heart disease and other diseases of the circulatory system: Secondary | ICD-10-CM | POA: Diagnosis not present

## 2020-04-21 DIAGNOSIS — Z87891 Personal history of nicotine dependence: Secondary | ICD-10-CM | POA: Diagnosis not present

## 2020-04-21 DIAGNOSIS — Z836 Family history of other diseases of the respiratory system: Secondary | ICD-10-CM | POA: Diagnosis not present

## 2020-04-21 DIAGNOSIS — E785 Hyperlipidemia, unspecified: Secondary | ICD-10-CM | POA: Diagnosis not present

## 2020-04-21 DIAGNOSIS — Z79899 Other long term (current) drug therapy: Secondary | ICD-10-CM | POA: Diagnosis not present

## 2020-04-21 DIAGNOSIS — R531 Weakness: Secondary | ICD-10-CM | POA: Diagnosis not present

## 2020-04-21 DIAGNOSIS — Z800 Family history of malignant neoplasm of digestive organs: Secondary | ICD-10-CM | POA: Diagnosis not present

## 2020-04-21 DIAGNOSIS — D509 Iron deficiency anemia, unspecified: Secondary | ICD-10-CM | POA: Diagnosis not present

## 2020-04-21 MED ORDER — SODIUM CHLORIDE 0.9 % IV SOLN: 200.0000 mg | Freq: Once | INTRAVENOUS | Status: DC

## 2020-04-21 MED ORDER — SODIUM CHLORIDE 0.9 % IV SOLN
Freq: Once | INTRAVENOUS | Status: AC
  Filled 2020-04-21: qty 250

## 2020-04-21 MED ORDER — IRON SUCROSE 20 MG/ML IV SOLN
200.0000 mg | Freq: Once | INTRAVENOUS | Status: AC
  Administered 2020-04-21: 200 mg via INTRAVENOUS
  Filled 2020-04-21: qty 10

## 2020-04-24 ENCOUNTER — Inpatient Hospital Stay: Payer: BC Managed Care – PPO | Attending: Oncology

## 2020-04-24 ENCOUNTER — Other Ambulatory Visit: Payer: Self-pay | Admitting: Adult Health

## 2020-04-24 VITALS — BP 134/70 | HR 71 | Temp 99.7°F | Resp 16

## 2020-04-24 DIAGNOSIS — Z87891 Personal history of nicotine dependence: Secondary | ICD-10-CM | POA: Insufficient documentation

## 2020-04-24 DIAGNOSIS — R531 Weakness: Secondary | ICD-10-CM | POA: Diagnosis not present

## 2020-04-24 DIAGNOSIS — D508 Other iron deficiency anemias: Secondary | ICD-10-CM

## 2020-04-24 DIAGNOSIS — Z79899 Other long term (current) drug therapy: Secondary | ICD-10-CM | POA: Diagnosis not present

## 2020-04-24 DIAGNOSIS — R5383 Other fatigue: Secondary | ICD-10-CM | POA: Insufficient documentation

## 2020-04-24 DIAGNOSIS — Z800 Family history of malignant neoplasm of digestive organs: Secondary | ICD-10-CM | POA: Diagnosis not present

## 2020-04-24 DIAGNOSIS — Z801 Family history of malignant neoplasm of trachea, bronchus and lung: Secondary | ICD-10-CM | POA: Insufficient documentation

## 2020-04-24 DIAGNOSIS — Z8249 Family history of ischemic heart disease and other diseases of the circulatory system: Secondary | ICD-10-CM | POA: Diagnosis not present

## 2020-04-24 DIAGNOSIS — D509 Iron deficiency anemia, unspecified: Secondary | ICD-10-CM | POA: Insufficient documentation

## 2020-04-24 DIAGNOSIS — Z833 Family history of diabetes mellitus: Secondary | ICD-10-CM | POA: Diagnosis not present

## 2020-04-24 DIAGNOSIS — J988 Other specified respiratory disorders: Secondary | ICD-10-CM

## 2020-04-24 MED ORDER — SODIUM CHLORIDE 0.9 % IV SOLN
Freq: Once | INTRAVENOUS | Status: AC
  Filled 2020-04-24: qty 250

## 2020-04-24 MED ORDER — SODIUM CHLORIDE 0.9 % IV SOLN: 200.0000 mg | Freq: Once | INTRAVENOUS | Status: DC

## 2020-04-24 MED ORDER — IRON SUCROSE 20 MG/ML IV SOLN
200.0000 mg | Freq: Once | INTRAVENOUS | Status: AC
  Administered 2020-04-24: 200 mg via INTRAVENOUS
  Filled 2020-04-24: qty 10

## 2020-04-29 ENCOUNTER — Ambulatory Visit (INDEPENDENT_AMBULATORY_CARE_PROVIDER_SITE_OTHER): Payer: BC Managed Care – PPO | Admitting: Hospice and Palliative Medicine

## 2020-04-29 ENCOUNTER — Other Ambulatory Visit: Payer: Self-pay | Admitting: Nurse Practitioner

## 2020-04-29 ENCOUNTER — Other Ambulatory Visit: Payer: Self-pay | Admitting: Hospice and Palliative Medicine

## 2020-04-29 ENCOUNTER — Encounter: Payer: Self-pay | Admitting: Hospice and Palliative Medicine

## 2020-04-29 VITALS — BP 140/84 | HR 80 | Temp 97.9°F | Resp 16 | Ht 72.0 in | Wt 219.4 lb

## 2020-04-29 DIAGNOSIS — F410 Panic disorder [episodic paroxysmal anxiety] without agoraphobia: Secondary | ICD-10-CM

## 2020-04-29 DIAGNOSIS — G47429 Narcolepsy in conditions classified elsewhere without cataplexy: Secondary | ICD-10-CM | POA: Diagnosis not present

## 2020-04-29 DIAGNOSIS — E039 Hypothyroidism, unspecified: Secondary | ICD-10-CM | POA: Diagnosis not present

## 2020-04-29 DIAGNOSIS — I10 Essential (primary) hypertension: Secondary | ICD-10-CM

## 2020-04-29 DIAGNOSIS — F411 Generalized anxiety disorder: Secondary | ICD-10-CM

## 2020-04-29 DIAGNOSIS — R5383 Other fatigue: Secondary | ICD-10-CM

## 2020-04-29 DIAGNOSIS — Z79899 Other long term (current) drug therapy: Secondary | ICD-10-CM | POA: Diagnosis not present

## 2020-04-29 DIAGNOSIS — Z125 Encounter for screening for malignant neoplasm of prostate: Secondary | ICD-10-CM

## 2020-04-29 DIAGNOSIS — D509 Iron deficiency anemia, unspecified: Secondary | ICD-10-CM

## 2020-04-29 LAB — POCT URINE DRUG SCREEN
Methylenedioxyamphetamine: NOT DETECTED
POC Amphetamine UR: POSITIVE — AB
POC BENZODIAZEPINES UR: POSITIVE — AB
POC Barbiturate UR: NOT DETECTED
POC Cocaine UR: NOT DETECTED
POC Ecstasy UR: NOT DETECTED
POC Marijuana UR: NOT DETECTED
POC Methadone UR: NOT DETECTED
POC Methamphetamine UR: NOT DETECTED
POC Opiate Ur: NOT DETECTED
POC Oxycodone UR: POSITIVE — AB
POC PHENCYCLIDINE UR: NOT DETECTED
POC TRICYCLICS UR: POSITIVE — AB

## 2020-04-29 MED ORDER — ALPRAZOLAM 0.5 MG PO TABS: 0.5000 mg | ORAL_TABLET | Freq: Three times a day (TID) | ORAL | 1 refills | Status: DC | PRN

## 2020-04-29 MED ORDER — AMPHETAMINE-DEXTROAMPHETAMINE 20 MG PO TABS: 20.0000 mg | ORAL_TABLET | Freq: Three times a day (TID) | ORAL | 0 refills | Status: DC

## 2020-04-29 MED ORDER — AMPHETAMINE-DEXTROAMPHETAMINE 20 MG PO TABS
20.0000 mg | ORAL_TABLET | Freq: Three times a day (TID) | ORAL | 0 refills | Status: DC
Start: 2020-04-29 — End: 2020-07-18

## 2020-04-29 NOTE — Progress Notes (Signed)
[facility] [place], [place] [address]  Internal MEDICINE  Office Visit Note  Patient Name: Randy Mclean Younger  [id number]  [id number]  Date of Service: 04/30/2020  Chief Complaint  Patient presents with  . Hypertension    3 month f-up  . Hypothyroidism  . Hyperlipidemia  . Medication Refill    HPI Patient is here for routine follow-up Since last visit has had X5 IV iron infusions for ferritin levels of 5--almost lost his job due to his inability to stay awake or focused which prompted him to be seen by his hematologist--known history of iron deficiency anemia History of depression, anxiety, hypogonadism and narcolepsy Well maintained on Adderall, alprazolam, Wellbutrin, Celexa and testosterone replacement  History of chronic back pain with failed spinal surgeries, followed by pain management, spinal stimulator in place  Due to routine labs and monitoring for high risk medication use   Current Medication: Outpatient Encounter Medications as of 04/29/2020  Medication Sig  . albuterol (VENTOLIN HFA) 108 (90 Base) MCG/ACT inhaler Inhale 2 puffs into the lungs every 4 (four) hours as needed for wheezing or shortness of breath.  . alfuzosin (UROXATRAL) 10 MG 24 hr tablet TAKE 1 TABLET BY MOUTH DAILY  . amLODipine (NORVASC) 5 MG tablet Take 10 mg by mouth daily.  Marland Kitchen amphetamine-dextroamphetamine (ADDERALL) 20 MG tablet Take 1 tablet (20 mg total) by mouth 3 (three) times daily.  Marland Kitchen atorvastatin (LIPITOR) 10 MG tablet Take 1 tablet (10 mg total) by mouth daily.  Marland Kitchen azelastine (ASTELIN) 0.1 % nasal spray USE 1 SPRAY NASALLY 2 TIMES A DAY  . buPROPion (WELLBUTRIN XL) 300 MG 24 hr tablet TAKE 1 TABLET BY MOUTH DAILY, GENERIC EQUIVALENT FOR WELLBUTRIN XL. PATIENT DUE FOR OFFICE VISIT.  Marland Kitchen citalopram (CELEXA) 20 MG tablet TAKE 1 TABLET BY MOUTH DAILY  . esomeprazole (NEXIUM) 40 MG capsule TK ONE C PO  BID UTD  . hydrochlorothiazide (HYDRODIURIL) 12.5 MG tablet Take  by mouth.  Marland Kitchen l-methylfolate-B6-B12 (METANX) 3-35-2 MG TABS Take 1 tablet by mouth 2 (two) times daily.  Marland Kitchen levothyroxine (SYNTHROID) 150 MCG tablet Take 1 tablet (150 mcg total) by mouth daily before breakfast.  . losartan (COZAAR) 50 MG tablet Take 1 tablet (50 mg total) by mouth daily. (Patient taking differently: Take 50 mg by mouth in the morning and at bedtime.)  . meloxicam (MOBIC) 7.5 MG tablet Take 1 tablet (7.5 mg total) by mouth 2 (two) times daily.  . methocarbamol (ROBAXIN) 750 MG tablet Take 750 mg by mouth 4 (four) times daily as needed.  . metoprolol succinate (TOPROL-XL) 25 MG 24 hr tablet TAKE 1 TABLET BY MOUTH DAILY. GENERIC EQUIVALENT FOR TOPROL XL  . mometasone-formoterol (DULERA) 100-5 MCG/ACT AERO INHALE 2 PUFFS BY MOUTH INTO THE LUNGS EVERY 12 HOURS  . MOVANTIK 25 MG TABS tablet Take 25 mg by mouth daily.  . Multiple Vitamin (MULTI-VITAMINS) TABS Take by mouth.  Gean Birchwood ER 200 MG TB12 200 mg bid  . oxyCODONE-acetaminophen (PERCOCET) 7.5-325 MG per tablet Take 1 tablet by mouth every 4 (four) hours as needed for pain.   . pregabalin (LYRICA) 100 MG capsule Take 100 mg by mouth every 8 (eight) hours.  . pregabalin (LYRICA) 50 MG capsule Take 100 mg by mouth 3 (three) times daily.   . SYRINGE-NEEDLE, DISP, 3 ML 22G X 1" 3 ML MISC Use as directed.  . testosterone cypionate (DEPOTESTOSTERONE CYPIONATE) 200 MG/ML injection INJECT 0.75 MLS (75MG  TOTAL) INTO THE MUSCLE EVERY  7 DAYS AS DIRECTED (Patient taking differently: INJECT 0.75 MLS (150MG  TOTAL) INTO THE MUSCLE EVERY 7 DAYS AS DIRECTED)  . tiZANidine (ZANAFLEX) 2 MG tablet TAKE 1 TABLET(2 MG) BY MOUTH AT BEDTIME (Patient taking differently: 4 mg.)  . Vitamin D, Ergocalciferol, (DRISDOL) 1.25 MG (50000 UNIT) CAPS capsule TAKE 1 CAPSULE BY MOUTH EVERY 7 DAYS  . [DISCONTINUED] ALPRAZolam (XANAX) 0.5 MG tablet Take 1 tablet (0.5 mg total) by mouth 3 (three) times daily as needed. for anxiety  . [DISCONTINUED]  amphetamine-dextroamphetamine (ADDERALL) 20 MG tablet Take 1 tablet (20 mg total) by mouth 3 (three) times daily.  . [DISCONTINUED] amphetamine-dextroamphetamine (ADDERALL) 20 MG tablet Take 1 tablet (20 mg total) by mouth in the morning, at noon, and at bedtime.  . [DISCONTINUED] amphetamine-dextroamphetamine (ADDERALL) 20 MG tablet Take 1 tablet (20 mg total) by mouth in the morning, at noon, and at bedtime.  . ALPRAZolam (XANAX) 0.5 MG tablet Take 1 tablet (0.5 mg total) by mouth 3 (three) times daily as needed. for anxiety  . amphetamine-dextroamphetamine (ADDERALL) 20 MG tablet Take 1 tablet (20 mg total) by mouth 3 (three) times daily.   No facility-administered encounter medications on file as of 04/29/2020.    Surgical History: Past Surgical History:  Procedure Laterality Date  . ANTERIOR CERVICAL DISCECTOMY    . APPENDECTOMY    . BACK SURGERY    . COLONOSCOPY  August 2015  . elbow surgery    . HEMORRHOID SURGERY    . LAPAROSCOPIC APPENDECTOMY N/A 12/04/2017   Procedure: APPENDECTOMY LAPAROSCOPIC;  Surgeon: Vickie Epley, MD;  Location: [facility] ORS;  Service: General;  Laterality: N/A;  . LIPOMA EXCISION Right    leg  . 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 SPINAL CORD STIMULATOR IMPLANT    . UPPER GI ENDOSCOPY  August 2015    Medical History: Past Medical History:  Diagnosis Date  . Acute appendicitis 12/04/2017  . Allergy    takes allergy shots  . Anemia   . Arthritis   . Asthma   . Barrett esophagus   . Bronchitis   . Chronic pain   . Colon polyp   . Depression   . GERD (gastroesophageal reflux disease)   . Hemorrhoid   . Hyperlipidemia   . Hypertension   . Hypothyroid   . IBS (irritable bowel syndrome)   . Kidney stone   . MI (myocardial infarction) ([place])    between 2002 and 2006  . Migraine   . Pneumonia   . Seizures ([place])   . Sinus problem   . Spinal cord stimulator status     Family History: Family History  Problem Relation Age of  Onset  . Hypertension Mother   . Lung disease Mother   . Heart attack Father   . Diabetes Father     Social History   Socioeconomic History  . Marital status: Married    Spouse name: Not on file  . Number of children: Not on file  . Years of education: Not on file  . Highest education level: Not on file  Occupational History  . Not on file  Tobacco Use  . Smoking status: Former Smoker    Packs/day: 1.00    Years: 30.00    Pack years: 30.00    Types: Cigarettes    Quit date: 02/22/2005    Years since quitting: 15.1  . Smokeless tobacco: Never Used  Vaping Use  . Vaping Use: Never used  Substance and  Sexual Activity  . Alcohol use: Yes    Alcohol/week: 0.0 standard drinks    Comment: occasionally  . Drug use: No    Comment: past  . Sexual activity: Yes  Other Topics Concern  . Not on file  Social History Narrative  . Not on file   Social Determinants of Health   Financial Resource Strain: Not on file  Food Insecurity: Not on file  Transportation Needs: Not on file  Physical Activity: Not on file  Stress: Not on file  Social Connections: Not on file  Intimate Partner Violence: Not on file      Review of Systems  Constitutional: Negative for chills, fatigue and unexpected weight change.  HENT: Negative for congestion, postnasal drip, rhinorrhea, sneezing and sore throat.   Eyes: Negative for redness.  Respiratory: Negative for cough, chest tightness and shortness of breath.   Cardiovascular: Negative for chest pain and palpitations.  Gastrointestinal: Negative for abdominal pain, constipation, diarrhea, nausea and vomiting.  Genitourinary: Negative for dysuria and frequency.  Musculoskeletal: Negative for arthralgias, back pain, joint swelling and neck pain.  Skin: Negative for rash.  Neurological: Negative for tremors and numbness.  Hematological: Negative for adenopathy. Does not bruise/bleed easily.  Psychiatric/Behavioral: Negative for behavioral  problems (Depression), sleep disturbance and suicidal ideas. The patient is not nervous/anxious.     Vital Signs: BP 140/84   Pulse 80   Temp 97.9 F (36.6 C)   Resp 16   Ht 6' (1.829 m)   Wt 219 lb 6.4 oz (99.5 kg)   SpO2 97%   BMI 29.76 kg/m    Physical Exam Vitals reviewed.  Constitutional:      Appearance: Normal appearance. He is normal weight.  Cardiovascular:     Rate and Rhythm: Normal rate and regular rhythm.     Pulses: Normal pulses.     Heart sounds: Normal heart sounds.  Pulmonary:     Effort: Pulmonary effort is normal.     Breath sounds: Normal breath sounds.  Abdominal:     General: Abdomen is flat.     Palpations: Abdomen is soft.  Musculoskeletal:        General: Normal range of motion.     Cervical back: Normal range of motion.  Skin:    General: Skin is warm.  Neurological:     General: No focal deficit present.     Mental Status: He is alert and oriented to person, place, and time. Mental status is at baseline.  Psychiatric:        Mood and Affect: Mood normal.        Behavior: Behavior normal.        Thought Content: Thought content normal.        Judgment: Judgment normal.    Assessment/Plan: 1. Essential hypertension BP and HR well controlled, continue to monitor  2. Acquired hypothyroidism Review labs and adjust therapy as indicated - TSH + free T4  3. Generalized anxiety disorder with panic attacks Continue with current dose of alprazolam as well as other therapies May benefit from psychiatry [place] Controlled Substance Database was reviewed by me for overdose risk score (ORS) Reviewed risks and possible side effects associated with taking opiates, benzodiazepines and other CNS depressants. Combination of these could cause dizziness and drowsiness. Advised patient not to drive or operate machinery when taking these medications, as patient's and other's life can be at risk and will have consequences. Patient verbalized understanding in  this matter. Dependence and abuse for  these drugs will be monitored closely. A Controlled substance policy and procedure is on file which allows [facility] to order a urine drug screen test at any visit. Patient understands and agrees with the plan - ALPRAZolam (XANAX) 0.5 MG tablet; Take 1 tablet (0.5 mg total) by mouth 3 (three) times daily as needed. for anxiety  Dispense: 90 tablet; Refill: 1  4. Narcolepsy due to underlying condition without cataplexy Continue with current dosing Will need echocardiogram due to long standing use of stimulant therapy - amphetamine-dextroamphetamine (ADDERALL) 20 MG tablet; Take 1 tablet (20 mg total) by mouth 3 (three) times daily.  Dispense: 90 tablet; Refill: 0 - amphetamine-dextroamphetamine (ADDERALL) 20 MG tablet; Take 1 tablet (20 mg total) by mouth 3 (three) times daily.  Dispense: 90 tablet; Refill: 0  5. Screening for prostate cancer - PSA, total and free  6. Iron deficiency anemia, unspecified iron deficiency anemia type Followed by hematology Underwent upper and lower endoscopy which were normal, awaiting capsule study for possible underlying cause of acute on chronic anemia  7. Encounter for long-term (current) use of high-risk medication - POCT Urine Drug Screen  8. Other fatigue - TSH + free T4 - Comprehensive Metabolic Panel (CMET) - CBC w/Diff/Platelet - Lipid Panel With LDL/HDL Ratio - PSA, total and free - Vitamin B12 - Folate - Iron and TIBC - Ferritin  General Counseling: Lenell verbalizes understanding of the findings of todays visit and agrees with plan of treatment. I have discussed any further diagnostic evaluation that may be needed or ordered today. We also reviewed his medications today. he has been encouraged to call the office with any questions or concerns that should arise related to todays visit.    Orders Placed This Encounter  Procedures  . TSH + free T4  . Comprehensive Metabolic Panel (CMET)   . CBC w/Diff/Platelet  . Lipid Panel With LDL/HDL Ratio  . PSA, total and free  . Vitamin B12  . Folate  . Iron and TIBC  . Ferritin  . POCT Urine Drug Screen    Meds ordered this encounter  Medications  . ALPRAZolam (XANAX) 0.5 MG tablet    Sig: Take 1 tablet (0.5 mg total) by mouth 3 (three) times daily as needed. for anxiety    Dispense:  90 tablet    Refill:  1  . amphetamine-dextroamphetamine (ADDERALL) 20 MG tablet    Sig: Take 1 tablet (20 mg total) by mouth 3 (three) times daily.    Dispense:  90 tablet    Refill:  0  . amphetamine-dextroamphetamine (ADDERALL) 20 MG tablet    Sig: Take 1 tablet (20 mg total) by mouth 3 (three) times daily.    Dispense:  90 tablet    Refill:  0    Time spent: 30 Minutes Time spent includes review of chart, medications, test results and follow-up plan with the patient.  This patient was seen by Theodoro Grist AGNP-C in Collaboration with Dr Lavera Guise as a part of collaborative care agreement     Tanna Furry. [place] Whedbee AGNP-C Internal medicine

## 2020-04-30 ENCOUNTER — Other Ambulatory Visit: Payer: Self-pay | Admitting: Nurse Practitioner

## 2020-04-30 ENCOUNTER — Encounter: Payer: Self-pay | Admitting: Hospice and Palliative Medicine

## 2020-04-30 NOTE — Telephone Encounter (Signed)
Please check.

## 2020-05-29 DIAGNOSIS — M961 Postlaminectomy syndrome, not elsewhere classified: Secondary | ICD-10-CM | POA: Diagnosis not present

## 2020-05-29 DIAGNOSIS — G894 Chronic pain syndrome: Secondary | ICD-10-CM | POA: Diagnosis not present

## 2020-05-29 DIAGNOSIS — Z79891 Long term (current) use of opiate analgesic: Secondary | ICD-10-CM | POA: Diagnosis not present

## 2020-05-29 DIAGNOSIS — M4722 Other spondylosis with radiculopathy, cervical region: Secondary | ICD-10-CM | POA: Diagnosis not present

## 2020-06-20 ENCOUNTER — Other Ambulatory Visit: Payer: Self-pay | Admitting: Hospice and Palliative Medicine

## 2020-06-20 DIAGNOSIS — F410 Panic disorder [episodic paroxysmal anxiety] without agoraphobia: Secondary | ICD-10-CM

## 2020-06-20 DIAGNOSIS — E291 Testicular hypofunction: Secondary | ICD-10-CM

## 2020-06-20 MED ORDER — ALPRAZOLAM 0.5 MG PO TABS: 0.5000 mg | ORAL_TABLET | Freq: Three times a day (TID) | ORAL | 0 refills | Status: DC | PRN

## 2020-06-20 MED ORDER — TESTOSTERONE CYPIONATE 200 MG/ML IM SOLN
INTRAMUSCULAR | 0 refills | Status: DC
Start: 2020-06-20 — End: 2020-09-17

## 2020-06-23 DIAGNOSIS — D529 Folate deficiency anemia, unspecified: Secondary | ICD-10-CM | POA: Diagnosis not present

## 2020-06-23 DIAGNOSIS — E538 Deficiency of other specified B group vitamins: Secondary | ICD-10-CM | POA: Diagnosis not present

## 2020-06-23 DIAGNOSIS — R5383 Other fatigue: Secondary | ICD-10-CM | POA: Diagnosis not present

## 2020-06-24 LAB — CBC WITH DIFFERENTIAL/PLATELET
Basophils Absolute: 0.1 10*3/uL (ref 0.0–0.2)
Basos: 1 %
EOS (ABSOLUTE): 0.1 10*3/uL (ref 0.0–0.4)
Eos: 2 %
Hematocrit: 41.3 % (ref 37.5–51.0)
Hemoglobin: 13.1 g/dL (ref 13.0–17.7)
Immature Grans (Abs): 0 10*3/uL (ref 0.0–0.1)
Immature Granulocytes: 0 %
Lymphocytes Absolute: 1.6 10*3/uL (ref 0.7–3.1)
Lymphs: 22 %
MCH: 25.7 pg — ABNORMAL LOW (ref 26.6–33.0)
MCHC: 31.7 g/dL (ref 31.5–35.7)
MCV: 81 fL (ref 79–97)
Monocytes Absolute: 0.7 10*3/uL (ref 0.1–0.9)
Monocytes: 10 %
Neutrophils Absolute: 4.6 10*3/uL (ref 1.4–7.0)
Neutrophils: 65 %
Platelets: 272 10*3/uL (ref 150–450)
RBC: 5.1 x10E6/uL (ref 4.14–5.80)
RDW: 16.9 % — ABNORMAL HIGH (ref 11.6–15.4)
WBC: 7.2 10*3/uL (ref 3.4–10.8)

## 2020-06-24 LAB — LIPID PANEL WITH LDL/HDL RATIO
Cholesterol, Total: 148 mg/dL (ref 100–199)
HDL: 46 mg/dL (ref >39)
LDL Chol Calc (NIH): 80 mg/dL (ref 0–99)
LDL/HDL Ratio: 1.7 ratio (ref 0.0–3.6)
Triglycerides: 125 mg/dL (ref 0–149)
VLDL Cholesterol Cal: 22 mg/dL (ref 5–40)

## 2020-06-24 LAB — COMPREHENSIVE METABOLIC PANEL
ALT: 22 IU/L (ref 0–44)
AST: 27 IU/L (ref 0–40)
Albumin/Globulin Ratio: 2.1 (ref 1.2–2.2)
Albumin: 4.4 g/dL (ref 3.8–4.9)
Alkaline Phosphatase: 81 IU/L (ref 44–121)
BUN/Creatinine Ratio: 6 — ABNORMAL LOW (ref 9–20)
BUN: 7 mg/dL (ref 6–24)
Bilirubin Total: 0.2 mg/dL (ref 0.0–1.2)
CO2: 24 mmol/L (ref 20–29)
Calcium: 8.8 mg/dL (ref 8.7–10.2)
Chloride: 103 mmol/L (ref 96–106)
Creatinine, Ser: 1.26 mg/dL (ref 0.76–1.27)
Globulin, Total: 2.1 g/dL (ref 1.5–4.5)
Glucose: 92 mg/dL (ref 65–99)
Potassium: 3.7 mmol/L (ref 3.5–5.2)
Sodium: 143 mmol/L (ref 134–144)
Total Protein: 6.5 g/dL (ref 6.0–8.5)
eGFR: 67 mL/min/{1.73_m2} (ref >59)

## 2020-06-24 LAB — PSA, TOTAL AND FREE
PSA, Free Pct: 17 %
PSA, Free: 0.34 ng/mL
Prostate Specific Ag, Serum: 2 ng/mL (ref 0.0–4.0)

## 2020-06-24 LAB — TSH+FREE T4
Free T4: 0.92 ng/dL (ref 0.82–1.77)
TSH: 1.5 u[IU]/mL (ref 0.450–4.500)

## 2020-06-24 LAB — IRON AND TIBC
Iron Saturation: 10 % — ABNORMAL LOW (ref 15–55)
Iron: 36 ug/dL — ABNORMAL LOW (ref 38–169)
Total Iron Binding Capacity: 374 ug/dL (ref 250–450)
UIBC: 338 ug/dL (ref 111–343)

## 2020-06-24 LAB — FOLATE: Folate: 20 ng/mL (ref >3.0)

## 2020-06-24 LAB — FERRITIN: Ferritin: 23 ng/mL — ABNORMAL LOW (ref 30–400)

## 2020-06-24 LAB — VITAMIN B12: Vitamin B-12: 489 pg/mL (ref 232–1245)

## 2020-06-24 NOTE — Progress Notes (Signed)
Labs reviewed, will be discussed at upcoming visit.

## 2020-06-30 ENCOUNTER — Encounter: Payer: Self-pay | Admitting: Nurse Practitioner

## 2020-06-30 ENCOUNTER — Ambulatory Visit: Payer: BC Managed Care – PPO | Admitting: Nurse Practitioner

## 2020-06-30 ENCOUNTER — Other Ambulatory Visit: Payer: Self-pay

## 2020-06-30 VITALS — BP 138/80 | HR 70 | Temp 97.6°F | Resp 16 | Ht 72.0 in | Wt 212.8 lb

## 2020-06-30 DIAGNOSIS — Z79899 Other long term (current) drug therapy: Secondary | ICD-10-CM | POA: Diagnosis not present

## 2020-06-30 DIAGNOSIS — Z712 Person consulting for explanation of examination or test findings: Secondary | ICD-10-CM

## 2020-06-30 DIAGNOSIS — D509 Iron deficiency anemia, unspecified: Secondary | ICD-10-CM | POA: Diagnosis not present

## 2020-06-30 NOTE — Progress Notes (Signed)
Established Patient Office Visit  Subjective:  Patient ID: Randy Mclean, male    DOB: [birth year]  Age: 60 y.o. MRN: [id number]  CC:  Chief Complaint  Patient presents with  . Follow-up  . Hyperlipidemia  . Hypertension  . Gastroesophageal Reflux  . Depression  . Asthma  . Anemia    HPI Randy Mclean presents for a follow up visit to review lab results.  -no current concerns or issues at this time.  -history of anemia, iron/anemia profile slightly low. Pt followed by hem/onc specialist.  Annual physical exam due in September this year.  Past Medical History:  Diagnosis Date  . Acute appendicitis 12/04/2017  . Allergy    takes allergy shots  . Anemia   . Arthritis   . Asthma   . Barrett esophagus   . Bronchitis   . Chronic pain   . Colon polyp   . Depression   . GERD (gastroesophageal reflux disease)   . Hemorrhoid   . Hyperlipidemia   . Hypertension   . Hypothyroid   . IBS (irritable bowel syndrome)   . Kidney stone   . MI (myocardial infarction) ([place])    between 2002 and 2006  . Migraine   . Pneumonia   . Seizures ([place])   . Sinus problem   . Spinal cord stimulator status     Past Surgical History:  Procedure Laterality Date  . ANTERIOR CERVICAL DISCECTOMY    . APPENDECTOMY    . BACK SURGERY    . COLONOSCOPY  August 2015  . elbow surgery    . HEMORRHOID SURGERY    . LAPAROSCOPIC APPENDECTOMY N/A 12/04/2017   Procedure: APPENDECTOMY LAPAROSCOPIC;  Surgeon: Vickie Epley, MD;  Location: [facility] ORS;  Service: General;  Laterality: N/A;  . LIPOMA EXCISION Right    leg  . 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 SPINAL CORD STIMULATOR IMPLANT    . UPPER GI ENDOSCOPY  August 2015    Family History  Problem Relation Age of Onset  . Hypertension Mother   . Lung disease Mother   . Heart attack Father   . Diabetes Father     Social History   Socioeconomic History  . Marital status: Married    Spouse name: Not on file   . Number of children: Not on file  . Years of education: Not on file  . Highest education level: Not on file  Occupational History  . Not on file  Tobacco Use  . Smoking status: Former Smoker    Packs/day: 1.00    Years: 30.00    Pack years: 30.00    Types: Cigarettes    Quit date: 02/22/2005    Years since quitting: 15.3  . Smokeless tobacco: Never Used  Vaping Use  . Vaping Use: Never used  Substance and Sexual Activity  . Alcohol use: Yes    Alcohol/week: 0.0 standard drinks    Comment: occasionally  . Drug use: No    Comment: past  . Sexual activity: Yes  Other Topics Concern  . Not on file  Social History Narrative  . Not on file   Social Determinants of Health   Financial Resource Strain: Not on file  Food Insecurity: Not on file  Transportation Needs: Not on file  Physical Activity: Not on file  Stress: Not on file  Social Connections: Not on file  Intimate Partner Violence: Not on file    Outpatient Medications Prior  to Visit  Medication Sig Dispense Refill  . albuterol (VENTOLIN HFA) 108 (90 Base) MCG/ACT inhaler Inhale 2 puffs into the lungs every 4 (four) hours as needed for wheezing or shortness of breath. 8.5 g 3  . alfuzosin (UROXATRAL) 10 MG 24 hr tablet TAKE 1 TABLET BY MOUTH DAILY 90 tablet 3  . ALPRAZolam (XANAX) 0.5 MG tablet Take 1 tablet (0.5 mg total) by mouth 3 (three) times daily as needed. for anxiety 90 tablet 0  . amLODipine (NORVASC) 5 MG tablet Take 10 mg by mouth daily.    Marland Kitchen amphetamine-dextroamphetamine (ADDERALL) 20 MG tablet Take 1 tablet (20 mg total) by mouth 3 (three) times daily. 90 tablet 0  . amphetamine-dextroamphetamine (ADDERALL) 20 MG tablet Take 1 tablet (20 mg total) by mouth 3 (three) times daily. 90 tablet 0  . atorvastatin (LIPITOR) 10 MG tablet Take 1 tablet (10 mg total) by mouth daily. 90 tablet 1  . azelastine (ASTELIN) 0.1 % nasal spray USE 1 SPRAY NASALLY 2 TIMES A DAY 60 mL 3  . buPROPion (WELLBUTRIN XL) 300 MG 24  hr tablet TAKE 1 TABLET BY MOUTH DAILY, GENERIC EQUIVALENT FOR WELLBUTRIN XL. PATIENT DUE FOR OFFICE VISIT. 90 tablet 1  . citalopram (CELEXA) 20 MG tablet TAKE 1 TABLET BY MOUTH DAILY 90 tablet 1  . esomeprazole (NEXIUM) 40 MG capsule TK ONE C PO  BID UTD  0  . hydrochlorothiazide (HYDRODIURIL) 12.5 MG tablet Take by mouth.    Marland Kitchen l-methylfolate-B6-B12 (METANX) 3-35-2 MG TABS Take 1 tablet by mouth 2 (two) times daily.    Marland Kitchen levothyroxine (SYNTHROID) 150 MCG tablet Take 1 tablet (150 mcg total) by mouth daily before breakfast. 90 tablet 1  . losartan (COZAAR) 50 MG tablet Take 1 tablet (50 mg total) by mouth daily. (Patient taking differently: Take 50 mg by mouth in the morning and at bedtime.) 90 tablet 1  . meloxicam (MOBIC) 7.5 MG tablet Take 1 tablet (7.5 mg total) by mouth 2 (two) times daily. 180 tablet 1  . methocarbamol (ROBAXIN) 750 MG tablet Take 750 mg by mouth 4 (four) times daily as needed.    . metoprolol succinate (TOPROL-XL) 25 MG 24 hr tablet TAKE 1 TABLET BY MOUTH DAILY. GENERIC EQUIVALENT FOR TOPROL XL 90 tablet 1  . mometasone-formoterol (DULERA) 100-5 MCG/ACT AERO INHALE 2 PUFFS BY MOUTH INTO THE LUNGS EVERY 12 HOURS 39 g 3  . MOVANTIK 25 MG TABS tablet Take 25 mg by mouth daily.    . Multiple Vitamin (MULTI-VITAMINS) TABS Take by mouth.    Gean Birchwood ER 200 MG TB12 200 mg bid  0  . oxyCODONE-acetaminophen (PERCOCET) 7.5-325 MG per tablet Take 1 tablet by mouth every 4 (four) hours as needed for pain.     . pregabalin (LYRICA) 100 MG capsule Take 100 mg by mouth every 8 (eight) hours.    . pregabalin (LYRICA) 50 MG capsule Take 100 mg by mouth 3 (three) times daily.     . SYRINGE-NEEDLE, DISP, 3 ML 22G X 1" 3 ML MISC Use as directed.    . testosterone cypionate (DEPOTESTOSTERONE CYPIONATE) 200 MG/ML injection INJECT 0.75 MLS (150MG TOTAL) INTO THE MUSCLE EVERY 7 DAYS AS DIRECTED 10 mL 0  . tiZANidine (ZANAFLEX) 2 MG tablet TAKE 1 TABLET(2 MG) BY MOUTH AT BEDTIME (Patient taking  differently: 4 mg.) 10 tablet 0  . Vitamin D, Ergocalciferol, (DRISDOL) 1.25 MG (50000 UNIT) CAPS capsule TAKE 1 CAPSULE BY MOUTH EVERY 7 DAYS 12 capsule 1  No facility-administered medications prior to visit.    Allergies  Allergen Reactions  . Amoxicillin Anaphylaxis    REACTION: unspecified  . Penicillin G Anaphylaxis  . Gabapentin Other (See Comments)    REACTION: anaphylaxis  . Penicillins     REACTION: unspecified  . Adhesive [Tape] Rash    Band-aid    ROS Review of Systems  Constitutional: Negative.   HENT: Negative.   Eyes: Negative.   Respiratory: Negative.   Cardiovascular: Negative.   Gastrointestinal: Negative.   Skin: Negative.   Neurological: Negative.   Hematological: Negative.       Objective:    Physical Exam Constitutional:      Appearance: Normal appearance. He is normal weight.  HENT:     Head: Normocephalic and atraumatic.  Cardiovascular:     Rate and Rhythm: Normal rate and regular rhythm.     Pulses: Normal pulses.     Heart sounds: Normal heart sounds.  Pulmonary:     Effort: Pulmonary effort is normal.     Breath sounds: Normal breath sounds.  Skin:    General: Skin is warm and dry.     Capillary Refill: Capillary refill takes less than 2 seconds.  Neurological:     Mental Status: He is alert and oriented to person, place, and time.     BP 138/80   Pulse 70   Temp 97.6 F (36.4 C)   Resp 16   Ht 6' (1.829 m)   Wt 212 lb 12.8 oz (96.5 kg)   SpO2 97%   BMI 28.86 kg/m  Wt Readings from Last 3 Encounters:  06/30/20 212 lb 12.8 oz (96.5 kg)  04/29/20 219 lb 6.4 oz (99.5 kg)  04/11/20 222 lb 8 oz (100.9 kg)     Health Maintenance Due  Topic Date Due  . Hepatitis C Screening  Never done  . TETANUS/TDAP  02/23/2015    There are no preventive care reminders to display for this patient.  Lab Results  Component Value Date   TSH 1.500 06/23/2020   Lab Results  Component Value Date   WBC 7.2 06/23/2020   HGB 13.1  06/23/2020   HCT 41.3 06/23/2020   MCV 81 06/23/2020   PLT 272 06/23/2020   Lab Results  Component Value Date   NA 143 06/23/2020   K 3.7 06/23/2020   CO2 24 06/23/2020   GLUCOSE 92 06/23/2020   BUN 7 06/23/2020   CREATININE 1.26 06/23/2020   BILITOT 0.2 06/23/2020   ALKPHOS 81 06/23/2020   AST 27 06/23/2020   ALT 22 06/23/2020   PROT 6.5 06/23/2020   ALBUMIN 4.4 06/23/2020   CALCIUM 8.8 06/23/2020   ANIONGAP 7 05/05/2019   EGFR 67 06/23/2020   Lab Results  Component Value Date   CHOL 148 06/23/2020   Lab Results  Component Value Date   HDL 46 06/23/2020   Lab Results  Component Value Date   LDLCALC 80 06/23/2020   Lab Results  Component Value Date   TRIG 125 06/23/2020   Lab Results  Component Value Date   CHOLHDL 4 04/17/2009   Lab Results  Component Value Date   HGBA1C 5.3 05/14/2019      Assessment & Plan:   Problem List Items Addressed This Visit   None   Visit Diagnoses    Encounter for long-term (current) use of high-risk medication       Encounter to discuss test results  No orders of the defined types were placed in this encounter.  1. Encounter for long-term (current) use of high-risk medication Last UDS 04/29/20 +for amphetamine, tricyclics, opioids and benzos. This is consistent with his prescribed medications.  2. Encounter to discuss test results Lab results reviewed with pt. Lipid panel and CBC nl, TIBC and iron saturation slightly low. Iron deficiency anemia is managed by hem/onc specialist. PSA nl, b12 and folate and cmp nl. Results discussed with patient, he verbally acknowldged understanding of this lab results and did not have any other concerns or questions when asked. Follow up in September for annual physical exam.     3. Iron deficiency anemia, unspecified iron deficiency anemia type Improving and stable    Follow-up: Return in about 4 months (around 10/23/2020) for annual exam.    Clayborn Bigness, MD

## 2020-07-05 NOTE — Progress Notes (Signed)
[place]  [phone] [phone]  ID: DEMECO DUCKSWORTH Younger OB: 08/31/1960  MR#: [id number]  GEX#:[id number]  Patient Care Team: Lavera Guise, MD as PCP - General (Internal Medicine) Bary Castilla Forest Gleason, MD (General Surgery) Lavera Guise, MD (Internal Medicine) Lloyd Huger, MD as Consulting Physician (Oncology)  CHIEF COMPLAINT: Iron deficiency anemia.  INTERVAL HISTORY: Patient returns to clinic today for repeat laboratory work, further evaluation, and consideration of IV Venofer.  Despite improvement of his hemoglobin with treatment several months ago, patient still feels weak and fatigued.  He otherwise feels well.  He has no neurologic complaints.  He denies any recent fevers or illnesses.  He has no chest pain, shortness of breath, cough, or hemoptysis.  He denies any nausea, vomiting, constipation, or diarrhea.  He has no melena or hematochezia.  He has no urinary complaints.  Patient offers no further specific complaints today.  REVIEW OF SYSTEMS:   Review of Systems  Constitutional: Positive for malaise/fatigue. Negative for fever and weight loss.  Respiratory: Negative.  Negative for cough and shortness of breath.   Cardiovascular: Negative.  Negative for chest pain and leg swelling.  Gastrointestinal: Negative.  Negative for abdominal pain, blood in stool and melena.  Genitourinary: Negative.  Negative for hematuria.  Musculoskeletal: Negative.  Negative for back pain and neck pain.  Skin: Negative.  Negative for rash.  Neurological: Positive for weakness. Negative for dizziness, sensory change, focal weakness and headaches.  Psychiatric/Behavioral: Negative.  The patient is not nervous/anxious.     As per HPI. Otherwise, a complete review of systems is negative.  PAST MEDICAL HISTORY: Past Medical History:  Diagnosis Date  . Acute appendicitis 12/04/2017  . Allergy    takes allergy shots  . Anemia   . Arthritis   .  Asthma   . Barrett esophagus   . Bronchitis   . Chronic pain   . Colon polyp   . Depression   . GERD (gastroesophageal reflux disease)   . Hemorrhoid   . Hyperlipidemia   . Hypertension   . Hypothyroid   . IBS (irritable bowel syndrome)   . Kidney stone   . MI (myocardial infarction) ([place])    between 2002 and 2006  . Migraine   . Pneumonia   . Seizures ([place])   . Sinus problem   . Spinal cord stimulator status     PAST SURGICAL HISTORY: Past Surgical History:  Procedure Laterality Date  . ANTERIOR CERVICAL DISCECTOMY    . APPENDECTOMY    . BACK SURGERY    . COLONOSCOPY  August 2015  . elbow surgery    . HEMORRHOID SURGERY    . LAPAROSCOPIC APPENDECTOMY N/A 12/04/2017   Procedure: APPENDECTOMY LAPAROSCOPIC;  Surgeon: Vickie Epley, MD;  Location: [facility] ORS;  Service: General;  Laterality: N/A;  . LIPOMA EXCISION Right    leg  . 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 SPINAL CORD STIMULATOR IMPLANT    . UPPER GI ENDOSCOPY  August 2015    FAMILY HISTORY Family History  Problem Relation Age of Onset  . Hypertension Mother   . Lung disease Mother   . Heart attack Father   . Diabetes Father        ADVANCED DIRECTIVES:    HEALTH MAINTENANCE: Social History   Tobacco Use  . Smoking status: Former Smoker    Packs/day: 1.00    Years: 30.00    Pack years: 30.00  Types: Cigarettes    Quit date: 02/22/2005    Years since quitting: 15.3  . Smokeless tobacco: Never Used  Vaping Use  . Vaping Use: Never used  Substance Use Topics  . Alcohol use: Yes    Alcohol/week: 0.0 standard drinks    Comment: occasionally  . Drug use: No    Comment: past     Colonoscopy:  PAP:  Bone density:  Lipid panel:  Allergies  Allergen Reactions  . Amoxicillin Anaphylaxis    REACTION: unspecified  . Penicillin G Anaphylaxis  . Gabapentin Other (See Comments)    REACTION: anaphylaxis  . Penicillins     REACTION: unspecified  . Adhesive [Tape] Rash     Band-aid    Current Outpatient Medications  Medication Sig Dispense Refill  . albuterol (VENTOLIN HFA) 108 (90 Base) MCG/ACT inhaler Inhale 2 puffs into the lungs every 4 (four) hours as needed for wheezing or shortness of breath. 8.5 g 3  . ALPRAZolam (XANAX) 0.5 MG tablet Take 1 tablet (0.5 mg total) by mouth 3 (three) times daily as needed. for anxiety 90 tablet 0  . amLODipine (NORVASC) 5 MG tablet Take 10 mg by mouth daily.    Marland Kitchen amphetamine-dextroamphetamine (ADDERALL) 20 MG tablet Take 1 tablet (20 mg total) by mouth 3 (three) times daily. 90 tablet 0  . atorvastatin (LIPITOR) 10 MG tablet Take 1 tablet (10 mg total) by mouth daily. 90 tablet 1  . buPROPion (WELLBUTRIN XL) 300 MG 24 hr tablet TAKE 1 TABLET BY MOUTH DAILY, GENERIC EQUIVALENT FOR WELLBUTRIN XL. PATIENT DUE FOR OFFICE VISIT. 90 tablet 1  . esomeprazole (NEXIUM) 40 MG capsule TK ONE C PO  BID UTD  0  . hydrochlorothiazide (HYDRODIURIL) 12.5 MG tablet Take by mouth.    Marland Kitchen l-methylfolate-B6-B12 (METANX) 3-35-2 MG TABS Take 1 tablet by mouth 2 (two) times daily.    Marland Kitchen levothyroxine (SYNTHROID) 150 MCG tablet Take 1 tablet (150 mcg total) by mouth daily before breakfast. 90 tablet 1  . losartan (COZAAR) 50 MG tablet Take 1 tablet (50 mg total) by mouth daily. (Patient taking differently: Take 50 mg by mouth in the morning and at bedtime.) 90 tablet 1  . methocarbamol (ROBAXIN) 750 MG tablet Take 750 mg by mouth 4 (four) times daily as needed.    . metoprolol succinate (TOPROL-XL) 25 MG 24 hr tablet TAKE 1 TABLET BY MOUTH DAILY. GENERIC EQUIVALENT FOR TOPROL XL 90 tablet 1  . mometasone-formoterol (DULERA) 100-5 MCG/ACT AERO INHALE 2 PUFFS BY MOUTH INTO THE LUNGS EVERY 12 HOURS 39 g 3  . MOVANTIK 25 MG TABS tablet Take 25 mg by mouth daily.    . Multiple Vitamin (MULTI-VITAMINS) TABS Take by mouth.    Gean Birchwood ER 200 MG TB12 200 mg bid  0  . oxyCODONE-acetaminophen (PERCOCET) 7.5-325 MG per tablet Take 1 tablet by mouth every 4  (four) hours as needed for pain.     . pregabalin (LYRICA) 100 MG capsule Take 100 mg by mouth every 8 (eight) hours.    . SYRINGE-NEEDLE, DISP, 3 ML 22G X 1" 3 ML MISC Use as directed.    . testosterone cypionate (DEPOTESTOSTERONE CYPIONATE) 200 MG/ML injection INJECT 0.75 MLS (150MG  TOTAL) INTO THE MUSCLE EVERY 7 DAYS AS DIRECTED 10 mL 0  . alfuzosin (UROXATRAL) 10 MG 24 hr tablet TAKE 1 TABLET BY MOUTH DAILY 90 tablet 3  . amphetamine-dextroamphetamine (ADDERALL) 20 MG tablet Take 1 tablet (20 mg total) by mouth 3 (three) times  daily. 90 tablet 0  . azelastine (ASTELIN) 0.1 % nasal spray USE 1 SPRAY NASALLY 2 TIMES A DAY 60 mL 3  . citalopram (CELEXA) 20 MG tablet TAKE 1 TABLET BY MOUTH DAILY 90 tablet 1  . meloxicam (MOBIC) 7.5 MG tablet Take 1 tablet (7.5 mg total) by mouth 2 (two) times daily. 180 tablet 1  . pregabalin (LYRICA) 50 MG capsule Take 100 mg by mouth 3 (three) times daily.     Marland Kitchen tiZANidine (ZANAFLEX) 2 MG tablet TAKE 1 TABLET(2 MG) BY MOUTH AT BEDTIME (Patient taking differently: 4 mg.) 10 tablet 0  . Vitamin D, Ergocalciferol, (DRISDOL) 1.25 MG (50000 UNIT) CAPS capsule TAKE 1 CAPSULE BY MOUTH EVERY 7 DAYS 12 capsule 1   No current facility-administered medications for this visit.    OBJECTIVE: Vitals:   07/10/20 1415  BP: (!) 143/90  Pulse: 71  Temp: 98.9 F (37.2 C)  SpO2: 97%     Body mass index is 29.76 kg/m.    ECOG FS:0 - Asymptomatic  General: Well-developed, well-nourished, no acute distress. Eyes: Pink conjunctiva, anicteric sclera. HEENT: Normocephalic, moist mucous membranes. Lungs: No audible wheezing or coughing. Heart: Regular rate and rhythm. Abdomen: Soft, nontender, no obvious distention. Musculoskeletal: No edema, cyanosis, or clubbing. Neuro: Alert, answering all questions appropriately. Cranial nerves grossly intact. Skin: No rashes or petechiae noted. Psych: Normal affect.  LAB RESULTS:  Lab Results  Component Value Date   NA 143  06/23/2020   K 3.7 06/23/2020   CL 103 06/23/2020   CO2 24 06/23/2020   GLUCOSE 92 06/23/2020   BUN 7 06/23/2020   CREATININE 1.26 06/23/2020   CALCIUM 8.8 06/23/2020   PROT 6.5 06/23/2020   ALBUMIN 4.4 06/23/2020   AST 27 06/23/2020   ALT 22 06/23/2020   ALKPHOS 81 06/23/2020   BILITOT 0.2 06/23/2020   GFRNONAA >60 05/05/2019   GFRAA >60 05/05/2019    Lab Results  Component Value Date   WBC 6.1 07/09/2020   NEUTROABS 3.5 07/09/2020   HGB 13.1 07/09/2020   HCT 41.2 07/09/2020   MCV 82.2 07/09/2020   PLT 205 07/09/2020   Lab Results  Component Value Date   IRON 38 (L) 07/09/2020   TIBC 428 07/09/2020   IRONPCTSAT 9 (L) 07/09/2020   Lab Results  Component Value Date   FERRITIN 8 (L) 07/09/2020     STUDIES: No results found.  ASSESSMENT: Iron deficiency anemia.  PLAN:    1.  Iron deficiency anemia: Patient's hemoglobin is now within normal limits at 13.1, but his iron stores remain significantly reduced and he is mildly symptomatic.  For insurance purposes, Shirlean Kelly was discontinued and patient will now receive IV Venofer.  Proceed with treatment today.  Return to clinic 2 times next week for additional infusions.  Patient will then return to clinic in 4 months with repeat laboratory work, further evaluation, and continuation of treatment if needed.  2. Family history of colon cancer:  Patient gets routine colonoscopies given his family history of colon cancer, all of which been unrevealing.  Patient reports his last colonoscopy was delayed secondary to Covid.  He has not rescheduled one at this point.  His genetic testing is negative. 3.  Neck pain: Patient underwent cervical vertebrae fusion surgery in fall 2020.  I spent a total of 30 minutes reviewing chart data, face-to-face evaluation with the patient, counseling and coordination of care as detailed above.   Patient expressed understanding and was in agreement with this plan.  He also understands that He can  call clinic at any time with any questions, concerns, or complaints.    Lloyd Huger, MD   07/10/2020 3:01 PM

## 2020-07-09 ENCOUNTER — Inpatient Hospital Stay: Payer: BC Managed Care – PPO | Attending: Oncology

## 2020-07-09 DIAGNOSIS — Z833 Family history of diabetes mellitus: Secondary | ICD-10-CM | POA: Diagnosis not present

## 2020-07-09 DIAGNOSIS — Z8249 Family history of ischemic heart disease and other diseases of the circulatory system: Secondary | ICD-10-CM | POA: Diagnosis not present

## 2020-07-09 DIAGNOSIS — R531 Weakness: Secondary | ICD-10-CM | POA: Diagnosis not present

## 2020-07-09 DIAGNOSIS — D509 Iron deficiency anemia, unspecified: Secondary | ICD-10-CM | POA: Diagnosis not present

## 2020-07-09 DIAGNOSIS — Z800 Family history of malignant neoplasm of digestive organs: Secondary | ICD-10-CM | POA: Diagnosis not present

## 2020-07-09 DIAGNOSIS — Z801 Family history of malignant neoplasm of trachea, bronchus and lung: Secondary | ICD-10-CM | POA: Insufficient documentation

## 2020-07-09 DIAGNOSIS — M542 Cervicalgia: Secondary | ICD-10-CM | POA: Diagnosis not present

## 2020-07-09 DIAGNOSIS — I10 Essential (primary) hypertension: Secondary | ICD-10-CM | POA: Insufficient documentation

## 2020-07-09 DIAGNOSIS — R5383 Other fatigue: Secondary | ICD-10-CM | POA: Insufficient documentation

## 2020-07-09 DIAGNOSIS — Z87891 Personal history of nicotine dependence: Secondary | ICD-10-CM | POA: Insufficient documentation

## 2020-07-09 DIAGNOSIS — Z79899 Other long term (current) drug therapy: Secondary | ICD-10-CM | POA: Diagnosis not present

## 2020-07-09 LAB — CBC WITH DIFFERENTIAL/PLATELET
Abs Immature Granulocytes: 0.01 10*3/uL (ref 0.00–0.07)
Basophils Absolute: 0.1 10*3/uL (ref 0.0–0.1)
Basophils Relative: 1 %
Eosinophils Absolute: 0.1 10*3/uL (ref 0.0–0.5)
Eosinophils Relative: 2 %
HCT: 41.2 % (ref 39.0–52.0)
Hemoglobin: 13.1 g/dL (ref 13.0–17.0)
Immature Granulocytes: 0 %
Lymphocytes Relative: 29 %
Lymphs Abs: 1.8 10*3/uL (ref 0.7–4.0)
MCH: 26.1 pg (ref 26.0–34.0)
MCHC: 31.8 g/dL (ref 30.0–36.0)
MCV: 82.2 fL (ref 80.0–100.0)
Monocytes Absolute: 0.5 10*3/uL (ref 0.1–1.0)
Monocytes Relative: 9 %
Neutro Abs: 3.5 10*3/uL (ref 1.7–7.7)
Neutrophils Relative %: 59 %
Platelets: 205 10*3/uL (ref 150–400)
RBC: 5.01 MIL/uL (ref 4.22–5.81)
RDW: 16.5 % — ABNORMAL HIGH (ref 11.5–15.5)
WBC: 6.1 10*3/uL (ref 4.0–10.5)
nRBC: 0 % (ref 0.0–0.2)

## 2020-07-09 LAB — IRON AND TIBC
Iron: 38 ug/dL — ABNORMAL LOW (ref 45–182)
Saturation Ratios: 9 % — ABNORMAL LOW (ref 17.9–39.5)
TIBC: 428 ug/dL (ref 250–450)
UIBC: 390 ug/dL

## 2020-07-09 LAB — FERRITIN: Ferritin: 8 ng/mL — ABNORMAL LOW (ref 24–336)

## 2020-07-10 ENCOUNTER — Inpatient Hospital Stay (HOSPITAL_BASED_OUTPATIENT_CLINIC_OR_DEPARTMENT_OTHER): Payer: BC Managed Care – PPO | Admitting: Oncology

## 2020-07-10 ENCOUNTER — Encounter: Payer: Self-pay | Admitting: Oncology

## 2020-07-10 ENCOUNTER — Inpatient Hospital Stay: Payer: BC Managed Care – PPO

## 2020-07-10 ENCOUNTER — Other Ambulatory Visit: Payer: Self-pay

## 2020-07-10 VITALS — BP 137/82 | HR 69 | Temp 98.4°F | Resp 17

## 2020-07-10 VITALS — BP 143/90 | HR 71 | Temp 98.9°F | Ht 72.0 in | Wt 219.4 lb

## 2020-07-10 DIAGNOSIS — R531 Weakness: Secondary | ICD-10-CM | POA: Diagnosis not present

## 2020-07-10 DIAGNOSIS — Z801 Family history of malignant neoplasm of trachea, bronchus and lung: Secondary | ICD-10-CM | POA: Diagnosis not present

## 2020-07-10 DIAGNOSIS — Z800 Family history of malignant neoplasm of digestive organs: Secondary | ICD-10-CM | POA: Diagnosis not present

## 2020-07-10 DIAGNOSIS — Z833 Family history of diabetes mellitus: Secondary | ICD-10-CM | POA: Diagnosis not present

## 2020-07-10 DIAGNOSIS — R5383 Other fatigue: Secondary | ICD-10-CM | POA: Diagnosis not present

## 2020-07-10 DIAGNOSIS — Z8249 Family history of ischemic heart disease and other diseases of the circulatory system: Secondary | ICD-10-CM | POA: Diagnosis not present

## 2020-07-10 DIAGNOSIS — D508 Other iron deficiency anemias: Secondary | ICD-10-CM

## 2020-07-10 DIAGNOSIS — M542 Cervicalgia: Secondary | ICD-10-CM | POA: Diagnosis not present

## 2020-07-10 DIAGNOSIS — D509 Iron deficiency anemia, unspecified: Secondary | ICD-10-CM | POA: Diagnosis not present

## 2020-07-10 DIAGNOSIS — Z87891 Personal history of nicotine dependence: Secondary | ICD-10-CM | POA: Diagnosis not present

## 2020-07-10 DIAGNOSIS — I10 Essential (primary) hypertension: Secondary | ICD-10-CM | POA: Diagnosis not present

## 2020-07-10 DIAGNOSIS — Z79899 Other long term (current) drug therapy: Secondary | ICD-10-CM | POA: Diagnosis not present

## 2020-07-10 MED ORDER — IRON SUCROSE 20 MG/ML IV SOLN
200.0000 mg | Freq: Once | INTRAVENOUS | Status: AC
Start: 2020-07-10 — End: 2020-07-10
  Administered 2020-07-10: 200 mg via INTRAVENOUS
  Filled 2020-07-10: qty 10

## 2020-07-10 MED ORDER — SODIUM CHLORIDE 0.9 % IV SOLN: 200.0000 mg | Freq: Once | INTRAVENOUS | Status: DC

## 2020-07-10 MED ORDER — SODIUM CHLORIDE 0.9 % IV SOLN
Freq: Once | INTRAVENOUS | Status: AC
  Filled 2020-07-10: qty 250

## 2020-07-10 NOTE — Patient Instructions (Signed)

## 2020-07-12 ENCOUNTER — Other Ambulatory Visit: Payer: Self-pay | Admitting: Adult Health

## 2020-07-12 DIAGNOSIS — F339 Major depressive disorder, recurrent, unspecified: Secondary | ICD-10-CM

## 2020-07-13 ENCOUNTER — Other Ambulatory Visit: Payer: Self-pay | Admitting: Adult Health

## 2020-07-14 ENCOUNTER — Inpatient Hospital Stay: Payer: BC Managed Care – PPO

## 2020-07-14 ENCOUNTER — Other Ambulatory Visit: Payer: Self-pay | Admitting: Adult Health

## 2020-07-15 ENCOUNTER — Other Ambulatory Visit: Payer: Self-pay

## 2020-07-15 ENCOUNTER — Inpatient Hospital Stay: Payer: BC Managed Care – PPO

## 2020-07-17 ENCOUNTER — Inpatient Hospital Stay: Payer: BC Managed Care – PPO

## 2020-07-17 ENCOUNTER — Other Ambulatory Visit: Payer: Self-pay

## 2020-07-17 VITALS — BP 166/88 | HR 72 | Temp 97.3°F

## 2020-07-17 DIAGNOSIS — R5383 Other fatigue: Secondary | ICD-10-CM | POA: Diagnosis not present

## 2020-07-17 DIAGNOSIS — D509 Iron deficiency anemia, unspecified: Secondary | ICD-10-CM | POA: Diagnosis not present

## 2020-07-17 DIAGNOSIS — Z833 Family history of diabetes mellitus: Secondary | ICD-10-CM | POA: Diagnosis not present

## 2020-07-17 DIAGNOSIS — I10 Essential (primary) hypertension: Secondary | ICD-10-CM | POA: Diagnosis not present

## 2020-07-17 DIAGNOSIS — R531 Weakness: Secondary | ICD-10-CM | POA: Diagnosis not present

## 2020-07-17 DIAGNOSIS — Z8249 Family history of ischemic heart disease and other diseases of the circulatory system: Secondary | ICD-10-CM | POA: Diagnosis not present

## 2020-07-17 DIAGNOSIS — Z79899 Other long term (current) drug therapy: Secondary | ICD-10-CM | POA: Diagnosis not present

## 2020-07-17 DIAGNOSIS — Z800 Family history of malignant neoplasm of digestive organs: Secondary | ICD-10-CM | POA: Diagnosis not present

## 2020-07-17 DIAGNOSIS — Z87891 Personal history of nicotine dependence: Secondary | ICD-10-CM | POA: Diagnosis not present

## 2020-07-17 DIAGNOSIS — Z801 Family history of malignant neoplasm of trachea, bronchus and lung: Secondary | ICD-10-CM | POA: Diagnosis not present

## 2020-07-17 DIAGNOSIS — M542 Cervicalgia: Secondary | ICD-10-CM | POA: Diagnosis not present

## 2020-07-17 DIAGNOSIS — D508 Other iron deficiency anemias: Secondary | ICD-10-CM

## 2020-07-17 MED ORDER — SODIUM CHLORIDE 0.9 % IV SOLN
Freq: Once | INTRAVENOUS | Status: AC
Start: 2020-07-17 — End: 2020-07-17
  Filled 2020-07-17: qty 250

## 2020-07-17 MED ORDER — SODIUM CHLORIDE 0.9 % IV SOLN: 200.0000 mg | Freq: Once | INTRAVENOUS | Status: DC

## 2020-07-17 MED ORDER — IRON SUCROSE 20 MG/ML IV SOLN
200.0000 mg | Freq: Once | INTRAVENOUS | Status: AC
Start: 2020-07-17 — End: 2020-07-17
  Administered 2020-07-17: 200 mg via INTRAVENOUS
  Filled 2020-07-17: qty 10

## 2020-07-17 NOTE — Patient Instructions (Signed)

## 2020-07-18 ENCOUNTER — Other Ambulatory Visit: Payer: Self-pay

## 2020-07-18 DIAGNOSIS — F339 Major depressive disorder, recurrent, unspecified: Secondary | ICD-10-CM

## 2020-07-18 MED ORDER — CITALOPRAM HYDROBROMIDE 20 MG PO TABS: 20.0000 mg | ORAL_TABLET | Freq: Every day | ORAL | 1 refills | Status: DC

## 2020-07-23 DIAGNOSIS — I6523 Occlusion and stenosis of bilateral carotid arteries: Secondary | ICD-10-CM | POA: Diagnosis not present

## 2020-07-23 DIAGNOSIS — E782 Mixed hyperlipidemia: Secondary | ICD-10-CM | POA: Diagnosis not present

## 2020-07-23 DIAGNOSIS — I10 Essential (primary) hypertension: Secondary | ICD-10-CM | POA: Diagnosis not present

## 2020-07-24 ENCOUNTER — Inpatient Hospital Stay: Payer: BC Managed Care – PPO

## 2020-07-24 DIAGNOSIS — G894 Chronic pain syndrome: Secondary | ICD-10-CM | POA: Diagnosis not present

## 2020-07-24 DIAGNOSIS — M961 Postlaminectomy syndrome, not elsewhere classified: Secondary | ICD-10-CM | POA: Diagnosis not present

## 2020-07-24 DIAGNOSIS — Z79891 Long term (current) use of opiate analgesic: Secondary | ICD-10-CM | POA: Diagnosis not present

## 2020-07-24 DIAGNOSIS — M4722 Other spondylosis with radiculopathy, cervical region: Secondary | ICD-10-CM | POA: Diagnosis not present

## 2020-07-25 ENCOUNTER — Inpatient Hospital Stay: Payer: BC Managed Care – PPO | Attending: Oncology

## 2020-07-25 VITALS — BP 128/78 | HR 67 | Temp 96.7°F | Resp 18

## 2020-07-25 DIAGNOSIS — D509 Iron deficiency anemia, unspecified: Secondary | ICD-10-CM

## 2020-07-25 DIAGNOSIS — D508 Other iron deficiency anemias: Secondary | ICD-10-CM

## 2020-07-25 MED ORDER — IRON SUCROSE 20 MG/ML IV SOLN
200.0000 mg | Freq: Once | INTRAVENOUS | Status: AC
  Administered 2020-07-25: 200 mg via INTRAVENOUS
  Filled 2020-07-25: qty 10

## 2020-07-25 MED ORDER — SODIUM CHLORIDE 0.9 % IV SOLN: 200.0000 mg | Freq: Once | INTRAVENOUS | Status: DC

## 2020-07-25 MED ORDER — SODIUM CHLORIDE 0.9 % IV SOLN
INTRAVENOUS | Status: DC
  Filled 2020-07-25: qty 250

## 2020-07-25 NOTE — Patient Instructions (Signed)
[facility]  Discharge Instructions: Thank you for choosing [facility] to provide your oncology and hematology care.  If you have a lab appointment with the [facility], please go directly to the [facility] and check in at the registration area.  Wear comfortable clothing and clothing appropriate for easy access to any Portacath or PICC line.   We strive to give you quality time with your provider. You may need to reschedule your appointment if you arrive late (15 or more minutes).  Arriving late affects you and other patients whose appointments are after yours.  Also, if you miss three or more appointments without notifying the office, you may be dismissed from the clinic at the provider's discretion.      For prescription refill requests, have your pharmacy contact our office and allow 72 hours for refills to be completed.    Today you received the followingVenofer   To help prevent nausea and vomiting after your treatment, we encourage you to take your nausea medication as directed.  BELOW ARE SYMPTOMS THAT SHOULD BE REPORTED IMMEDIATELY: . *FEVER GREATER THAN 100.4 F (38 C) OR HIGHER . *CHILLS OR SWEATING . *NAUSEA AND VOMITING THAT IS NOT CONTROLLED WITH YOUR NAUSEA MEDICATION . *UNUSUAL SHORTNESS OF BREATH . *UNUSUAL BRUISING OR BLEEDING . *URINARY PROBLEMS (pain or burning when urinating, or frequent urination) . *BOWEL PROBLEMS (unusual diarrhea, constipation, pain near the anus) . TENDERNESS IN MOUTH AND THROAT WITH OR WITHOUT PRESENCE OF ULCERS (sore throat, sores in mouth, or a toothache) . UNUSUAL RASH, SWELLING OR PAIN  . UNUSUAL VAGINAL DISCHARGE OR ITCHING   Items with * indicate a potential emergency and should be followed up as soon as possible or go to the Emergency Department if any problems should occur.  Please show the CHEMOTHERAPY ALERT CARD or IMMUNOTHERAPY ALERT CARD at check-in to the Emergency Department  and triage nurse.  Should you have questions after your visit or need to cancel or reschedule your appointment, please contact [facility]  [phone] and follow the prompts.  Office hours are 8:00 a.m. to 4:30 p.m. Monday - Friday. Please note that voicemails left after 4:00 p.m. may not be returned until the following business day.  We are closed weekends and major holidays. You have access to a nurse at all times for urgent questions. Please call the main number to the clinic [phone] and follow the prompts.  For any non-urgent questions, you may also contact your provider using MyChart. We now offer e-Visits for anyone 40 and older to request care online for non-urgent symptoms. For details visit [email or web address].   Also download the MyChart app! Go to the app store, search "MyChart", open the app, select [facility], and log in with your MyChart username and password.  Due to Covid, a mask is required upon entering the hospital/clinic. If you do not have a mask, one will be given to you upon arrival. For doctor visits, patients may have 1 support person aged 67 or older with them. For treatment visits, patients cannot have anyone with them due to current Covid guidelines and our immunocompromised population.  Iron Sucrose injection What is this medicine? IRON SUCROSE (AHY ern SOO krohs) is an iron complex. Iron is used to make healthy red blood cells, which carry oxygen and nutrients throughout the body. This medicine is used to treat iron deficiency anemia in people with chronic kidney disease. This medicine may be used  for other purposes; ask your health care provider or pharmacist if you have questions. COMMON BRAND NAME(S): Venofer What should I tell my health care provider before I take this medicine? They need to know if you have any of these conditions:  anemia not caused by low iron levels  heart disease  high levels of iron in the  blood  kidney disease  liver disease  an unusual or allergic reaction to iron, other medicines, foods, dyes, or preservatives  pregnant or trying to get pregnant  breast-feeding How should I use this medicine? This medicine is for infusion into a vein. It is given by a health care professional in a hospital or clinic setting. Talk to your pediatrician regarding the use of this medicine in children. While this drug may be prescribed for children as young as 2 years for selected conditions, precautions do apply. Overdosage: If you think you have taken too much of this medicine contact a poison control center or emergency room at once. NOTE: This medicine is only for you. Do not share this medicine with others. What if I miss a dose? It is important not to miss your dose. Call your doctor or health care professional if you are unable to keep an appointment. What may interact with this medicine? Do not take this medicine with any of the following medications:  deferoxamine  dimercaprol  other iron products This medicine may also interact with the following medications:  chloramphenicol  deferasirox This list may not describe all possible interactions. Give your health care provider a list of all the medicines, herbs, non-prescription drugs, or dietary supplements you use. Also tell them if you smoke, drink alcohol, or use illegal drugs. Some items may interact with your medicine. What should I watch for while using this medicine? Visit your doctor or healthcare professional regularly. Tell your doctor or healthcare professional if your symptoms do not start to get better or if they get worse. You may need blood work done while you are taking this medicine. You may need to follow a special diet. Talk to your doctor. Foods that contain iron include: whole grains/cereals, dried fruits, beans, or peas, leafy green vegetables, and organ meats (liver, kidney). What side effects may I notice  from receiving this medicine? Side effects that you should report to your doctor or health care professional as soon as possible:  allergic reactions like skin rash, itching or hives, swelling of the face, lips, or tongue  breathing problems  changes in blood pressure  cough  fast, irregular heartbeat  feeling faint or lightheaded, falls  fever or chills  flushing, sweating, or hot feelings  joint or muscle aches/pains  seizures  swelling of the ankles or feet  unusually weak or tired Side effects that usually do not require medical attention (report to your doctor or health care professional if they continue or are bothersome):  diarrhea  feeling achy  headache  irritation at site where injected  nausea, vomiting  stomach upset  tiredness This list may not describe all possible side effects. Call your doctor for medical advice about side effects. You may report side effects to FDA at 1-800-FDA-1088. Where should I keep my medicine? This drug is given in a hospital or clinic and will not be stored at home. NOTE: This sheet is a summary. It may not cover all possible information. If you have questions about this medicine, talk to your doctor, pharmacist, or health care provider.  2021 Elsevier/Gold Standard (2010-11-19 17:14:35)

## 2020-08-02 IMAGING — CR DG HIP (WITH OR WITHOUT PELVIS) 2-3V*R*
1 series · 4 of 4 positions shown · non-contrast
Comparison: None.

CLINICAL DATA: Acute right hip pain.  No known injury.

EXAM:
DG HIP (WITH OR WITHOUT PELVIS) 2-3V RIGHT

[Series 1: dg hip unilat w or w/o pelvis 2-3 views  · non-contrast · 0.14mm/px · 4 of 4 slices shown]
[im 1/4]
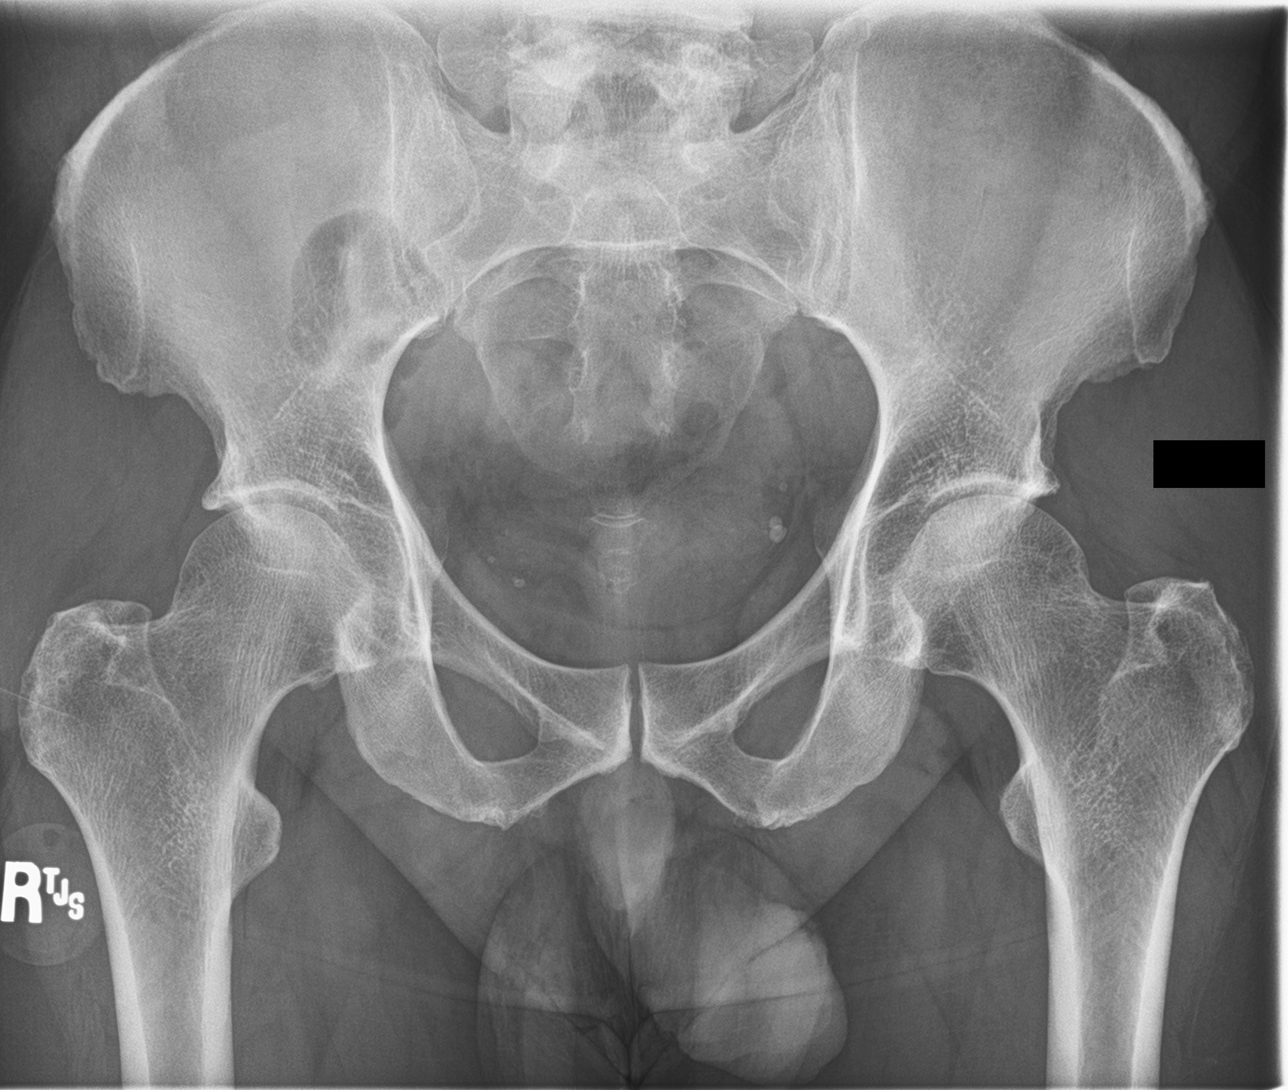
[im 2/4]
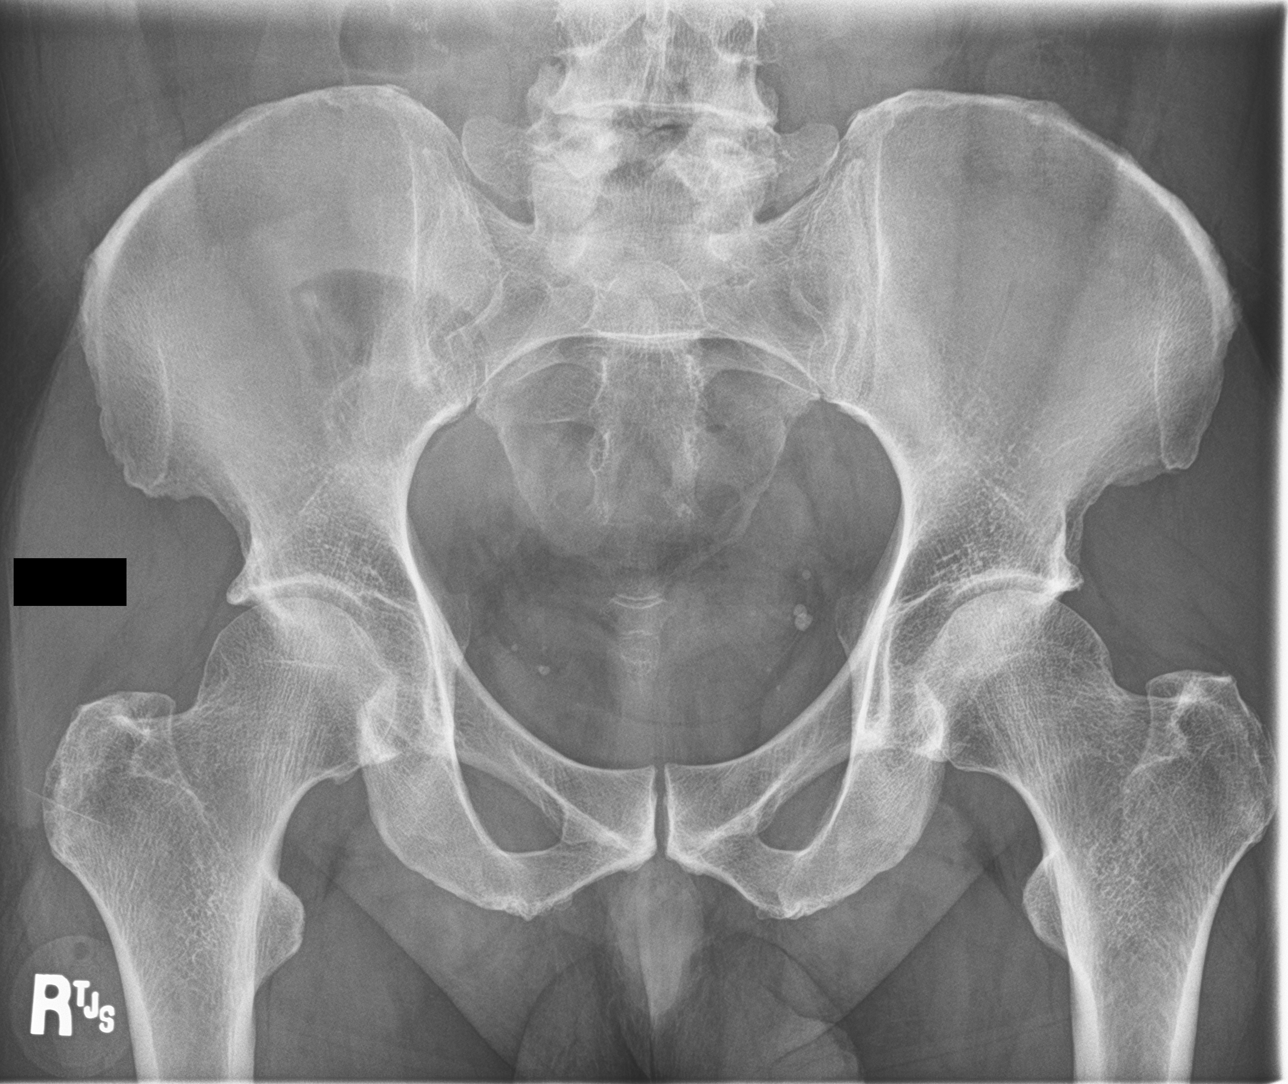
[im 3/4]
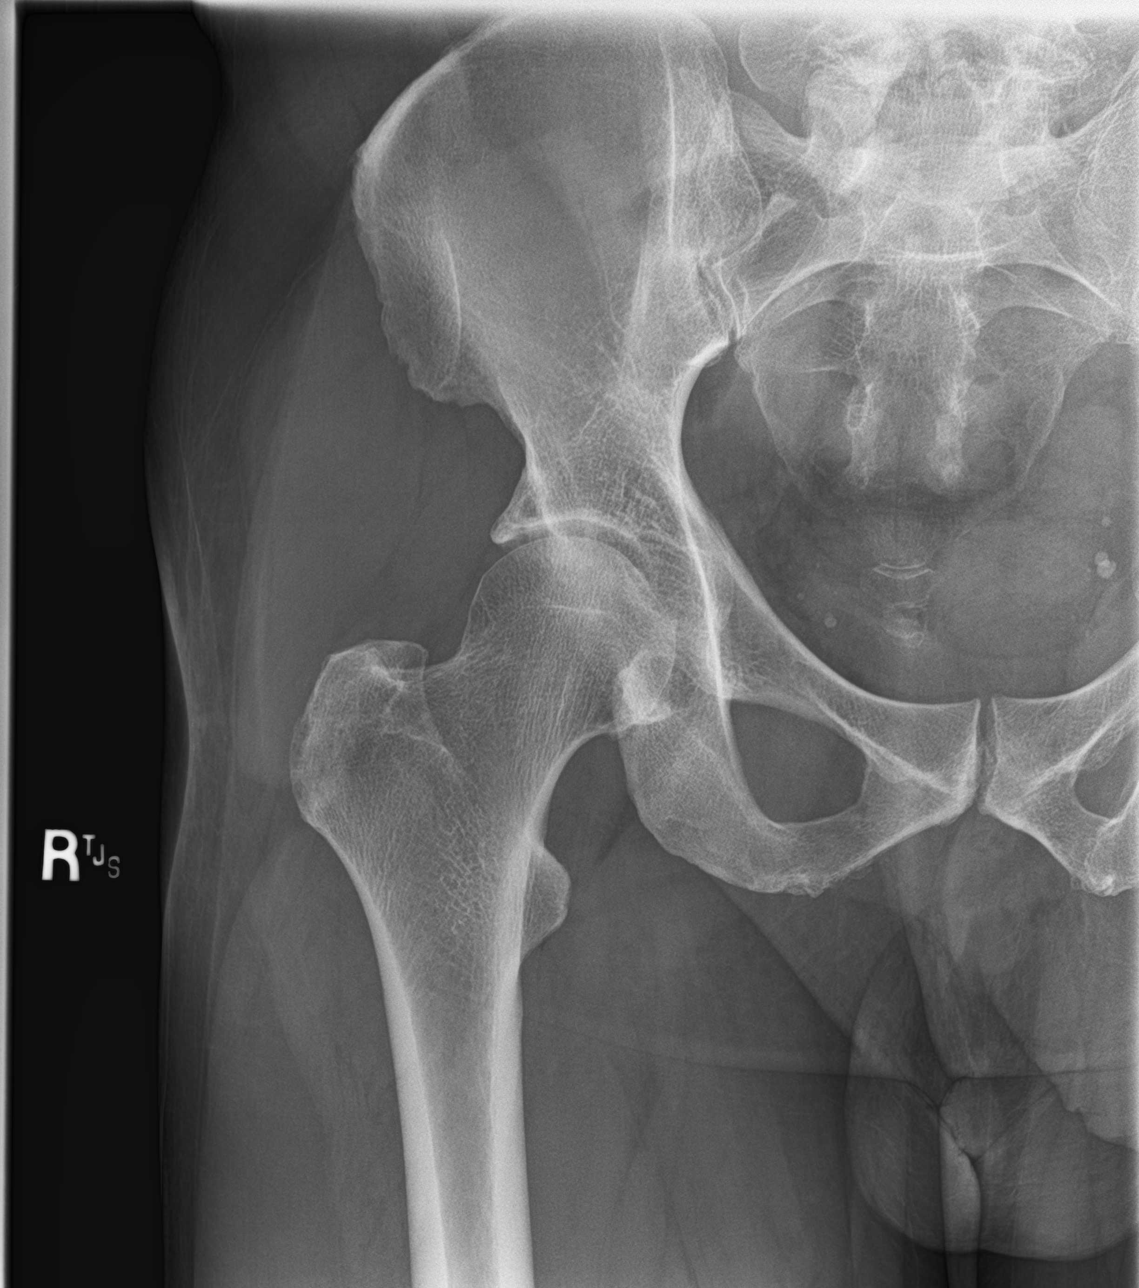
[im 4/4]
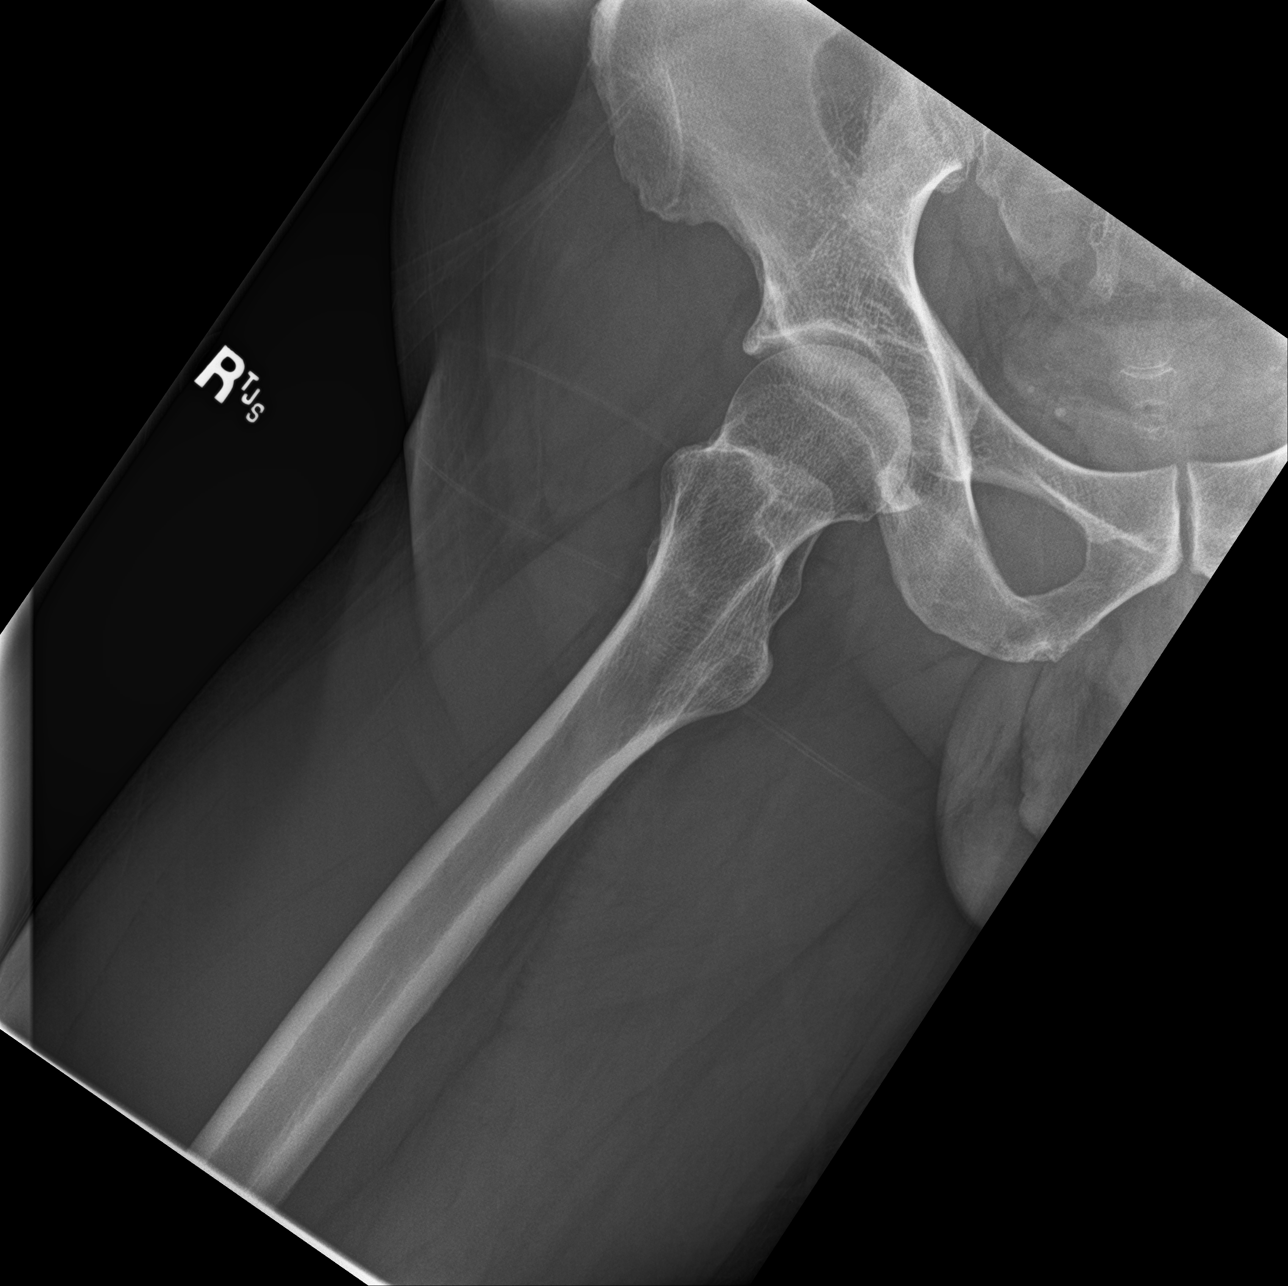

[4 of 4 positions shown; findings below may reference images not displayed]

FINDINGS: There is no evidence of hip fracture or dislocation. There is no
evidence of arthropathy or other focal bone abnormality.
IMPRESSION: Negative.

## 2020-08-02 IMAGING — CR DG LUMBAR SPINE COMPLETE 4+V
1 series · 5 of 5 positions shown · non-contrast
Comparison: None.

CLINICAL DATA: Low back and right leg pain without known injury.

EXAM:
LUMBAR SPINE - COMPLETE 4+ VIEW

[Series 1: dg lumbar spine complete 4 +v · 0.14mm/px · 5 of 5 slices shown]
[im 1/5]
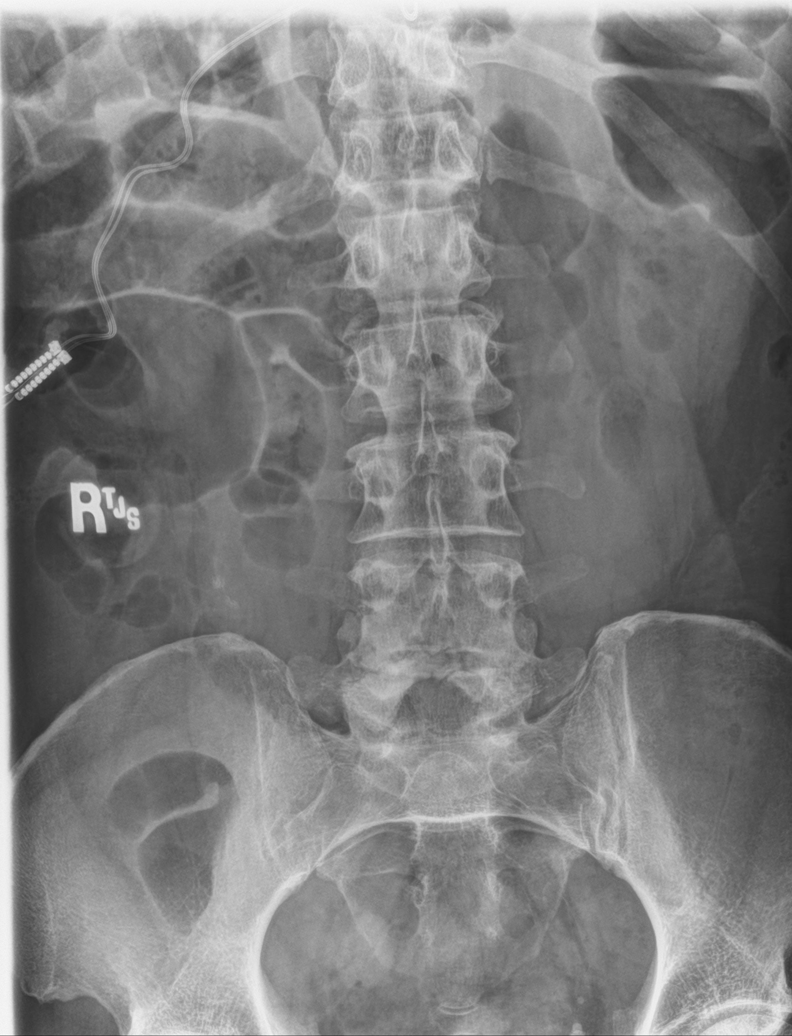
[im 2/5]
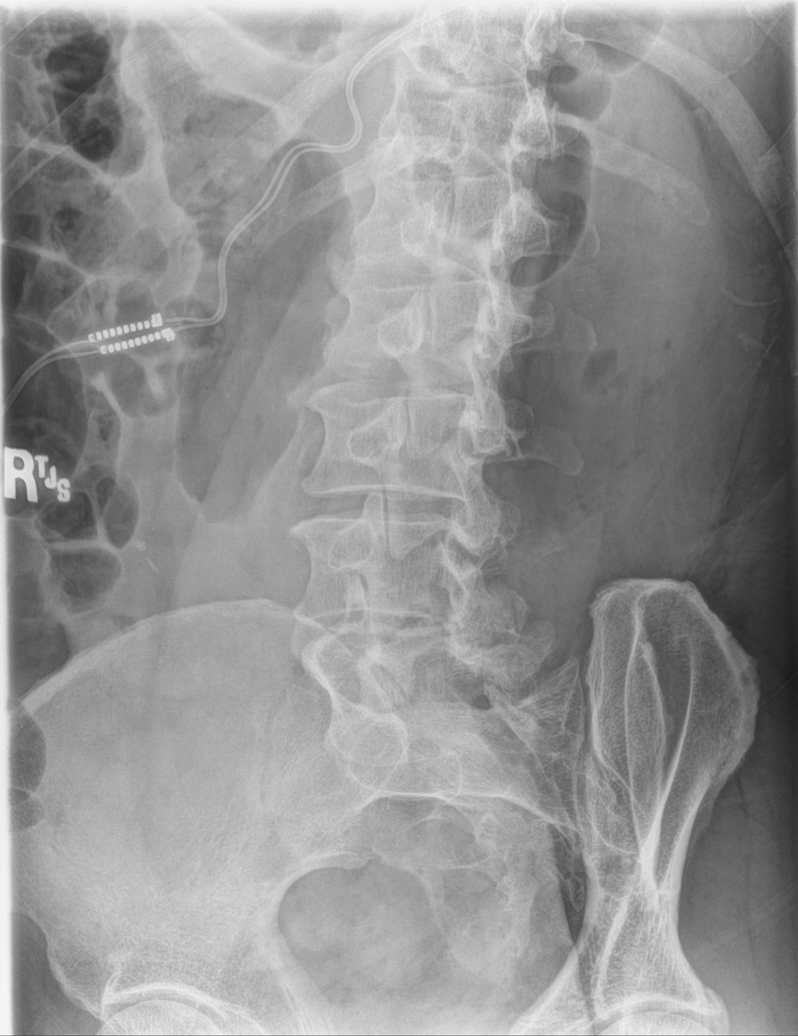
[im 3/5]
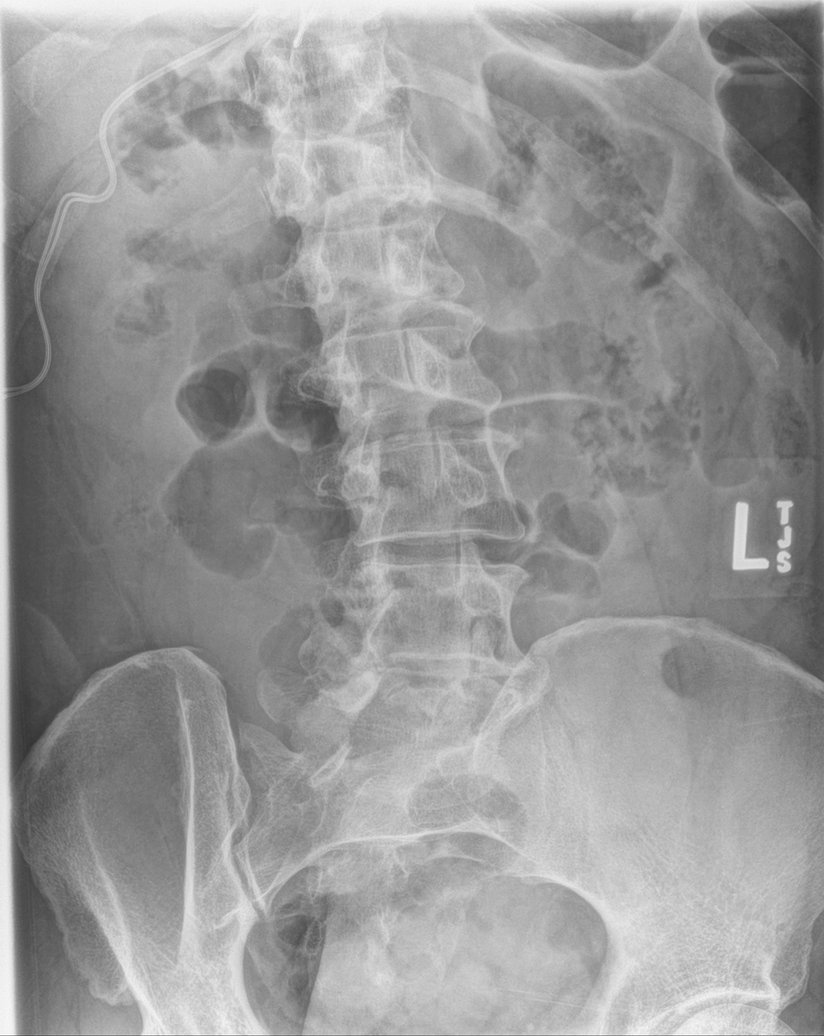
[im 4/5]
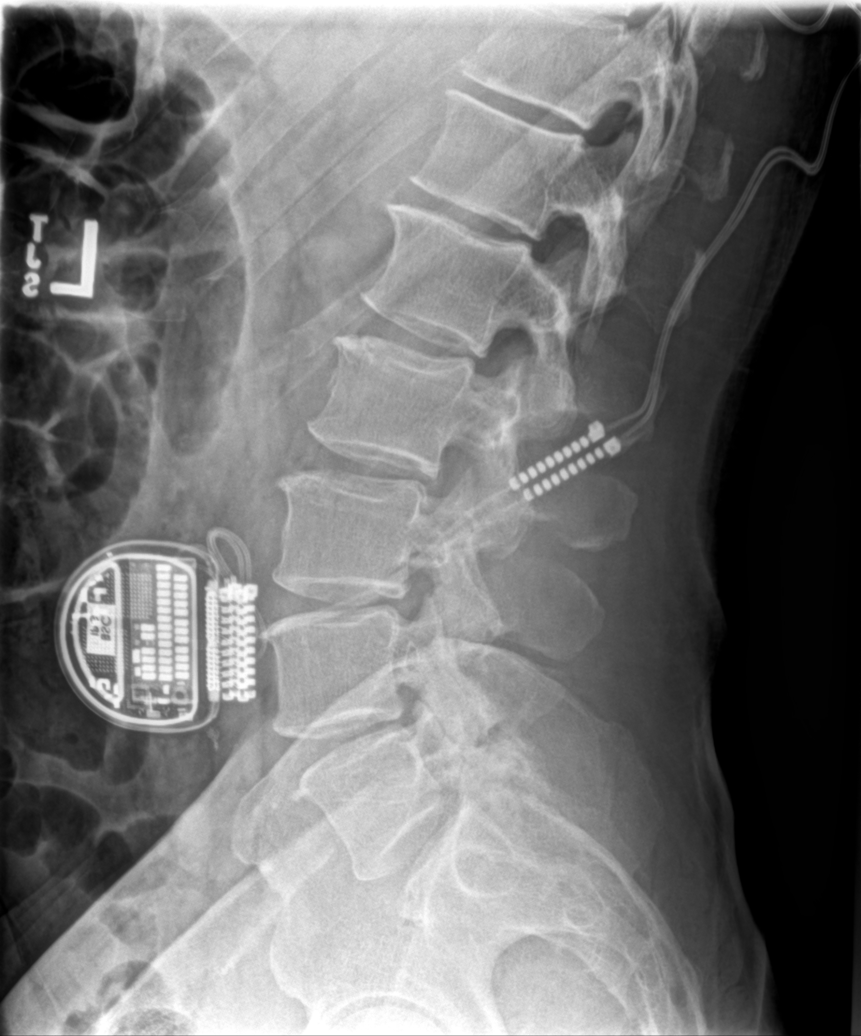
[im 5/5]
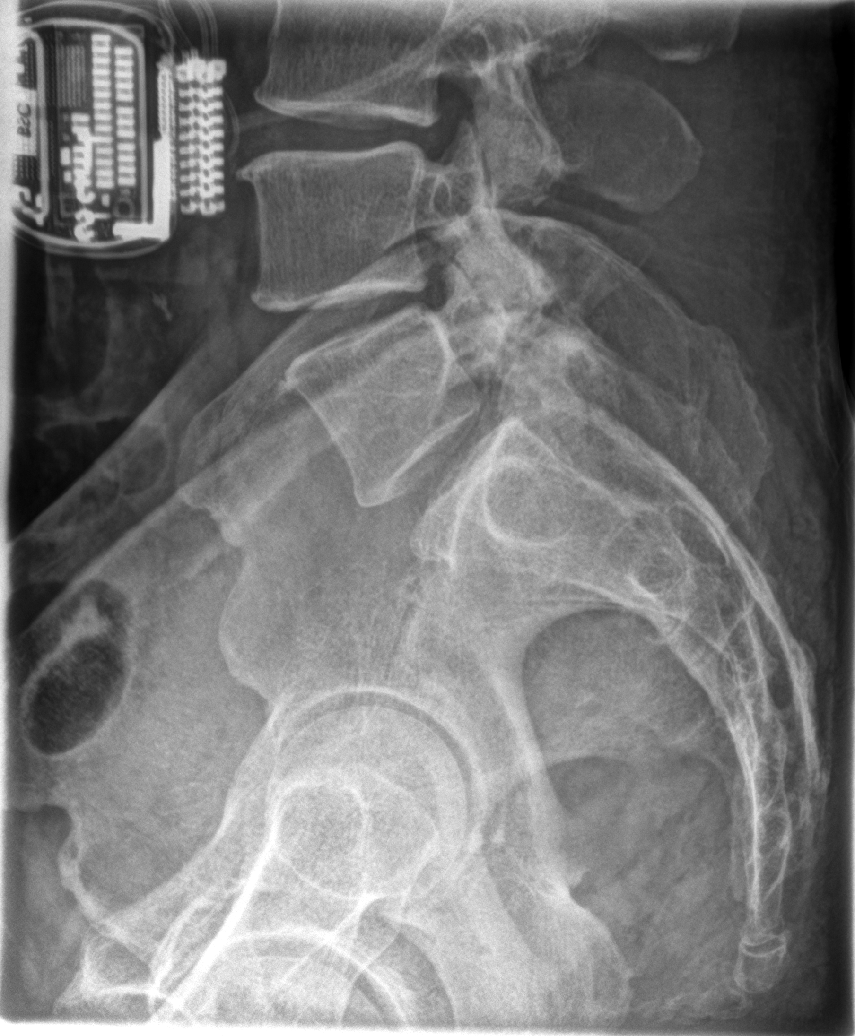

[5 of 5 positions shown; findings below may reference images not displayed]

FINDINGS: No fracture or significant spondylolisthesis is noted. Mild anterior
osteophyte formation is noted at L1-2, L2-3, L3-4 and L4-5. Disc
spaces appear to be well maintained.
IMPRESSION: Mild multilevel degenerative changes are noted. No acute abnormality
seen in the lumbar spine.

## 2020-08-30 ENCOUNTER — Other Ambulatory Visit: Payer: Self-pay | Admitting: Adult Health

## 2020-09-01 ENCOUNTER — Other Ambulatory Visit: Payer: Self-pay | Admitting: Nurse Practitioner

## 2020-09-01 ENCOUNTER — Other Ambulatory Visit: Payer: Self-pay | Admitting: Adult Health

## 2020-09-02 ENCOUNTER — Other Ambulatory Visit: Payer: Self-pay | Admitting: Adult Health

## 2020-09-02 DIAGNOSIS — M5416 Radiculopathy, lumbar region: Secondary | ICD-10-CM | POA: Diagnosis not present

## 2020-09-03 ENCOUNTER — Telehealth: Payer: Self-pay

## 2020-09-03 ENCOUNTER — Other Ambulatory Visit: Payer: Self-pay

## 2020-09-03 MED ORDER — ATORVASTATIN CALCIUM 10 MG PO TABS: 10.0000 mg | ORAL_TABLET | Freq: Every day | ORAL | 1 refills | Status: DC

## 2020-09-04 ENCOUNTER — Other Ambulatory Visit: Payer: Self-pay | Admitting: Internal Medicine

## 2020-09-04 ENCOUNTER — Other Ambulatory Visit: Payer: Self-pay | Admitting: Nurse Practitioner

## 2020-09-04 ENCOUNTER — Other Ambulatory Visit: Payer: Self-pay

## 2020-09-04 DIAGNOSIS — F410 Panic disorder [episodic paroxysmal anxiety] without agoraphobia: Secondary | ICD-10-CM

## 2020-09-04 DIAGNOSIS — G47429 Narcolepsy in conditions classified elsewhere without cataplexy: Secondary | ICD-10-CM

## 2020-09-04 MED ORDER — ALPRAZOLAM 0.5 MG PO TABS
0.5000 mg | ORAL_TABLET | Freq: Three times a day (TID) | ORAL | 0 refills | Status: DC | PRN
Start: 2020-09-04 — End: 2020-09-04

## 2020-09-04 MED ORDER — MELOXICAM 7.5 MG PO TABS: 7.5000 mg | ORAL_TABLET | Freq: Two times a day (BID) | ORAL | 1 refills | Status: DC

## 2020-09-04 MED ORDER — ALPRAZOLAM 0.5 MG PO TABS: 0.5000 mg | ORAL_TABLET | Freq: Three times a day (TID) | ORAL | 0 refills | Status: DC | PRN

## 2020-09-04 MED ORDER — AMPHETAMINE-DEXTROAMPHETAMINE 20 MG PO TABS: 20.0000 mg | ORAL_TABLET | Freq: Three times a day (TID) | ORAL | 0 refills | Status: DC

## 2020-09-04 MED ORDER — BUPROPION HCL ER (XL) 300 MG PO TB24: ORAL_TABLET | ORAL | 1 refills | Status: DC

## 2020-09-04 MED ORDER — DULERA 100-5 MCG/ACT IN AERO: INHALATION_SPRAY | RESPIRATORY_TRACT | 3 refills | Status: DC

## 2020-09-04 NOTE — Telephone Encounter (Signed)
Prescriptions sent to pharmacy

## 2020-09-05 ENCOUNTER — Other Ambulatory Visit: Payer: Self-pay | Admitting: Student

## 2020-09-05 DIAGNOSIS — M5416 Radiculopathy, lumbar region: Secondary | ICD-10-CM

## 2020-09-08 ENCOUNTER — Telehealth: Payer: Self-pay

## 2020-09-08 ENCOUNTER — Other Ambulatory Visit: Payer: Self-pay

## 2020-09-08 NOTE — Progress Notes (Signed)
Phone call to patient to verify medication list and allergies for myelogram procedure. Pt instructed to hold Adderall, Wellbutrin, Celexa, and Nucynta for 48hrs prior to myelogram appointment time and 24 hours after appointment. Pt reports "It wont be a problem at all to stop all 4 medications". Pt also instructed to have a driver the day of the procedure, the procedure would take around 2 hours, and discharge instructions discussed. Pt verbalized understanding.  Pt reports he has a spinal cord stimulator but he knows it is "CT safe" as he states "I have had CT scans before without turning it off".

## 2020-09-12 ENCOUNTER — Encounter: Payer: Self-pay | Admitting: Nurse Practitioner

## 2020-09-16 ENCOUNTER — Other Ambulatory Visit: Payer: Self-pay | Admitting: Internal Medicine

## 2020-09-16 ENCOUNTER — Other Ambulatory Visit: Payer: Self-pay

## 2020-09-16 ENCOUNTER — Ambulatory Visit
Admission: RE | Admit: 2020-09-16 | Discharge: 2020-09-16 | Disposition: A | Discharge status: Home / Self Care | Payer: BC Managed Care – PPO | Source: Ambulatory Visit | Attending: Student | Admitting: Student

## 2020-09-16 DIAGNOSIS — M4316 Spondylolisthesis, lumbar region: Secondary | ICD-10-CM | POA: Diagnosis not present

## 2020-09-16 DIAGNOSIS — M5416 Radiculopathy, lumbar region: Secondary | ICD-10-CM

## 2020-09-16 DIAGNOSIS — M48061 Spinal stenosis, lumbar region without neurogenic claudication: Secondary | ICD-10-CM | POA: Diagnosis not present

## 2020-09-16 DIAGNOSIS — E291 Testicular hypofunction: Secondary | ICD-10-CM

## 2020-09-16 MED ORDER — DIAZEPAM 5 MG PO TABS
5.0000 mg | ORAL_TABLET | Freq: Once | ORAL | Status: AC
  Administered 2020-09-16: 5 mg via ORAL

## 2020-09-16 MED ORDER — IOPAMIDOL (ISOVUE-M 200) INJECTION 41%
18.0000 mL | Freq: Once | INTRAMUSCULAR | Status: AC
  Administered 2020-09-16: 18 mL via INTRATHECAL

## 2020-09-16 MED ORDER — ONDANSETRON HCL 4 MG/2ML IJ SOLN
4.0000 mg | Freq: Once | INTRAMUSCULAR | Status: AC
  Administered 2020-09-16: 4 mg via INTRAMUSCULAR

## 2020-09-16 MED ORDER — MEPERIDINE HCL 50 MG/ML IJ SOLN
50.0000 mg | Freq: Once | INTRAMUSCULAR | Status: AC
  Administered 2020-09-16: 50 mg via INTRAMUSCULAR

## 2020-09-16 NOTE — Discharge Instr - Other Orders (Addendum)
11:20: patient reports 10/10 pain, see MAR  11:31 patient reports relief from medication

## 2020-09-16 NOTE — Progress Notes (Signed)
Patient turned off SCS with remote brought from home.

## 2020-09-16 NOTE — Discharge Instructions (Addendum)
Myelogram Discharge Instructions  Go home and rest quietly as needed. You may resume normal activities; however, do not exert yourself strongly or do any heavy lifting today and tomorrow.   DO NOT drive today.    You may resume your normal diet and medications unless otherwise indicated. Drink lots of extra fluids today and tomorrow.   The incidence of headache, nausea, or vomiting is about 5% (one in 20 patients).  If you develop a headache, lie flat for 24 hours and drink plenty of fluids until the headache goes away.  Caffeinated beverages may be helpful. If when you get up you still have a headache when standing, go back to bed and force fluids for another 24 hours.   If you develop severe nausea and vomiting or a headache that does not go away with the flat bedrest after 48 hours, please call [phone].   Call your physician for a follow-up appointment.  The results of your myelogram will be sent directly to your physician by the following day.  If you have any questions or if complications develop after you arrive home, please call [phone].  Discharge instructions have been explained to the patient.  The patient, or the person responsible for the patient, fully understands these instructions.   Thank you for visiting our office today.    YOU MAY RESUME YOUR ADDERALL, WELLBUTRIN, CELEXA, AND NUCYNTA ANYTIME AFTER PROCEDURE TODAY.

## 2020-09-17 NOTE — Telephone Encounter (Signed)
Refills sent to pharmacy. 

## 2020-09-18 DIAGNOSIS — Z4542 Encounter for adjustment and management of neuropacemaker (brain) (peripheral nerve) (spinal cord): Secondary | ICD-10-CM | POA: Insufficient documentation

## 2020-09-18 DIAGNOSIS — Z6827 Body mass index (BMI) 27.0-27.9, adult: Secondary | ICD-10-CM | POA: Diagnosis not present

## 2020-09-18 DIAGNOSIS — M48062 Spinal stenosis, lumbar region with neurogenic claudication: Secondary | ICD-10-CM | POA: Diagnosis not present

## 2020-09-18 DIAGNOSIS — M5416 Radiculopathy, lumbar region: Secondary | ICD-10-CM | POA: Diagnosis not present

## 2020-09-22 DIAGNOSIS — M1812 Unilateral primary osteoarthritis of first carpometacarpal joint, left hand: Secondary | ICD-10-CM | POA: Diagnosis not present

## 2020-09-24 DIAGNOSIS — M48062 Spinal stenosis, lumbar region with neurogenic claudication: Secondary | ICD-10-CM | POA: Diagnosis not present

## 2020-09-29 ENCOUNTER — Ambulatory Visit: Payer: BC Managed Care – PPO | Admitting: Nurse Practitioner

## 2020-10-08 ENCOUNTER — Encounter: Payer: Self-pay | Admitting: Nurse Practitioner

## 2020-10-08 ENCOUNTER — Ambulatory Visit: Payer: BC Managed Care – PPO | Admitting: Nurse Practitioner

## 2020-10-08 ENCOUNTER — Other Ambulatory Visit: Payer: Self-pay

## 2020-10-08 VITALS — BP 120/72 | HR 70 | Temp 97.8°F | Resp 16 | Ht 72.0 in | Wt 210.2 lb

## 2020-10-08 DIAGNOSIS — G47429 Narcolepsy in conditions classified elsewhere without cataplexy: Secondary | ICD-10-CM

## 2020-10-08 DIAGNOSIS — Z79899 Other long term (current) drug therapy: Secondary | ICD-10-CM | POA: Diagnosis not present

## 2020-10-08 DIAGNOSIS — I10 Essential (primary) hypertension: Secondary | ICD-10-CM

## 2020-10-08 DIAGNOSIS — Z23 Encounter for immunization: Secondary | ICD-10-CM

## 2020-10-08 DIAGNOSIS — F410 Panic disorder [episodic paroxysmal anxiety] without agoraphobia: Secondary | ICD-10-CM

## 2020-10-08 DIAGNOSIS — E039 Hypothyroidism, unspecified: Secondary | ICD-10-CM

## 2020-10-08 DIAGNOSIS — F411 Generalized anxiety disorder: Secondary | ICD-10-CM

## 2020-10-08 DIAGNOSIS — N401 Enlarged prostate with lower urinary tract symptoms: Secondary | ICD-10-CM

## 2020-10-08 MED ORDER — TETANUS-DIPHTH-ACELL PERTUSSIS 5-2.5-18.5 LF-MCG/0.5 IM SUSP
0.5000 mL | Freq: Once | INTRAMUSCULAR | 0 refills | Status: AC
Start: 2020-10-08 — End: 2020-10-08

## 2020-10-08 MED ORDER — PNEUMOCOCCAL 20-VAL CONJ VACC 0.5 ML IM SUSY
0.5000 mL | PREFILLED_SYRINGE | INTRAMUSCULAR | 0 refills | Status: AC
Start: 2020-10-08 — End: 2020-10-08

## 2020-10-08 MED ORDER — ALPRAZOLAM 0.5 MG PO TABS: 0.5000 mg | ORAL_TABLET | Freq: Three times a day (TID) | ORAL | 0 refills | Status: DC | PRN

## 2020-10-08 MED ORDER — LEVOTHYROXINE SODIUM 150 MCG PO TABS: 150.0000 ug | ORAL_TABLET | Freq: Every day | ORAL | 1 refills | Status: DC

## 2020-10-08 MED ORDER — ALFUZOSIN HCL ER 10 MG PO TB24: 10.0000 mg | ORAL_TABLET | Freq: Every day | ORAL | 1 refills | Status: DC

## 2020-10-08 MED ORDER — AMPHETAMINE-DEXTROAMPHETAMINE 20 MG PO TABS: 20.0000 mg | ORAL_TABLET | Freq: Three times a day (TID) | ORAL | 0 refills | Status: DC

## 2020-10-08 MED ORDER — ZOSTER VAC RECOMB ADJUVANTED 50 MCG/0.5ML IM SUSR
0.5000 mL | Freq: Once | INTRAMUSCULAR | 0 refills | Status: AC
Start: 2020-10-08 — End: 2020-10-08

## 2020-10-08 NOTE — Progress Notes (Signed)
[facility] [place], [place] [address]  Internal MEDICINE  Office Visit Note  Patient Name: Randy Mclean  [id number]  [id number]  Date of Service: 10/08/2020  Chief Complaint  Patient presents with   Follow-up    refills   Depression   Gastroesophageal Reflux   Hyperlipidemia   Hypertension   Asthma   Anemia    HPI Randy Mclean presents for a follow up visit for medication refills. His blood pressure is well controlled. He denies any pain. He has no concerns or questions today. He is scheduled for his annual physical exam in September next month. He is due for recommended vaccinations which will be ordered today. He is due for screening colonoscopy in 2025.     Current Medication: Outpatient Encounter Medications as of 10/08/2020  Medication Sig   albuterol (VENTOLIN HFA) 108 (90 Base) MCG/ACT inhaler Inhale 2 puffs into the lungs every 4 (four) hours as needed for wheezing or shortness of breath.   alfuzosin (UROXATRAL) 10 MG 24 hr tablet Take 1 tablet (10 mg total) by mouth daily with breakfast.   amLODipine (NORVASC) 5 MG tablet Take 10 mg by mouth daily.   atorvastatin (LIPITOR) 10 MG tablet Take 1 tablet (10 mg total) by mouth daily.   buPROPion (WELLBUTRIN XL) 300 MG 24 hr tablet TAKE 1 TABLET BY MOUTH DAILY, GENERIC EQUIVALENT FOR WELLBUTRIN XL. PATIENT DUE FOR OFFICE VISIT.   citalopram (CELEXA) 20 MG tablet Take 1 tablet (20 mg total) by mouth daily.   esomeprazole (NEXIUM) 40 MG capsule TK ONE C PO  BID UTD   hydrochlorothiazide (HYDRODIURIL) 12.5 MG tablet Take by mouth.   l-methylfolate-B6-B12 (METANX) 3-35-2 MG TABS Take 1 tablet by mouth 2 (two) times daily.   meloxicam (MOBIC) 7.5 MG tablet Take 1 tablet (7.5 mg total) by mouth 2 (two) times daily.   methocarbamol (ROBAXIN) 750 MG tablet Take 750 mg by mouth 4 (four) times daily as needed.   mometasone-formoterol (DULERA) 100-5 MCG/ACT AERO INHALE 2 PUFFS BY MOUTH INTO THE LUNGS  EVERY 12 HOURS   MOVANTIK 25 MG TABS tablet Take 25 mg by mouth daily.   Multiple Vitamin (MULTI-VITAMINS) TABS Take by mouth.   NUCYNTA ER 200 MG TB12 200 mg bid   oxyCODONE-acetaminophen (PERCOCET) 7.5-325 MG per tablet Take 1 tablet by mouth every 4 (four) hours as needed for pain.    pregabalin (LYRICA) 100 MG capsule Take 100 mg by mouth every 8 (eight) hours.   pregabalin (LYRICA) 50 MG capsule Take 100 mg by mouth 3 (three) times daily.    SYRINGE-NEEDLE, DISP, 3 ML 22G X 1" 3 ML MISC Use as directed.   testosterone cypionate (DEPOTESTOSTERONE CYPIONATE) 200 MG/ML injection INJECT 0.75 MLS INTO THE MUSCLE EVERY 7 DAYS AS DIRECTED   tiZANidine (ZANAFLEX) 4 MG tablet Take 4 mg by mouth every 6 (six) hours as needed for muscle spasms. Pt takes one tablet at night   valsartan (DIOVAN) 160 MG tablet Take 160 mg by mouth daily.   [DISCONTINUED] ALPRAZolam (XANAX) 0.5 MG tablet Take 1 tablet (0.5 mg total) by mouth 3 (three) times daily as needed. for anxiety   [DISCONTINUED] amphetamine-dextroamphetamine (ADDERALL) 20 MG tablet Take 1 tablet (20 mg total) by mouth 3 (three) times daily.   [DISCONTINUED] levothyroxine (SYNTHROID) 150 MCG tablet Take 1 tablet (150 mcg total) by mouth daily before breakfast.   [DISCONTINUED] metoprolol succinate (TOPROL-XL) 25 MG 24 hr tablet TAKE 1 TABLET BY MOUTH DAILY.  GENERIC EQUIVALENT FOR TOPROL XL   [DISCONTINUED] pneumococcal 20-Val Conj Vacc (PREVNAR 20) 0.5 ML injection Inject 0.5 mLs into the muscle tomorrow at 10 am.   [DISCONTINUED] Tdap (BOOSTRIX) 5-2.5-18.5 LF-MCG/0.5 injection Inject 0.5 mLs into the muscle once.   [DISCONTINUED] Zoster Vaccine Adjuvanted [facility]) injection Inject 0.5 mLs into the muscle once.   alfuzosin (UROXATRAL) 10 MG 24 hr tablet TAKE 1 TABLET BY MOUTH DAILY   ALPRAZolam (XANAX) 0.5 MG tablet Take 1 tablet (0.5 mg total) by mouth 3 (three) times daily as needed. for anxiety   amphetamine-dextroamphetamine (ADDERALL) 20 MG  tablet Take 1 tablet (20 mg total) by mouth 3 (three) times daily.   [START ON 11/05/2020] amphetamine-dextroamphetamine (ADDERALL) 20 MG tablet Take 1 tablet (20 mg total) by mouth 3 (three) times daily.   [START ON 12/03/2020] amphetamine-dextroamphetamine (ADDERALL) 20 MG tablet Take 1 tablet (20 mg total) by mouth 3 (three) times daily.   levothyroxine (SYNTHROID) 150 MCG tablet Take 1 tablet (150 mcg total) by mouth daily before breakfast.   [EXPIRED] pneumococcal 20-Val Conj Vacc (PREVNAR 20) 0.5 ML injection Inject 0.5 mLs into the muscle tomorrow at 10 am for 1 dose.   [EXPIRED] Tdap (BOOSTRIX) 5-2.5-18.5 LF-MCG/0.5 injection Inject 0.5 mLs into the muscle once for 1 dose.   Vitamin D, Ergocalciferol, (DRISDOL) 1.25 MG (50000 UNIT) CAPS capsule TAKE 1 CAPSULE BY MOUTH EVERY 7 DAYS   [EXPIRED] Zoster Vaccine Adjuvanted [facility]) injection Inject 0.5 mLs into the muscle once for 1 dose.   [DISCONTINUED] losartan (COZAAR) 50 MG tablet Take 1 tablet (50 mg total) by mouth daily. (Patient not taking: Reported on 09/08/2020)   No facility-administered encounter medications on file as of 10/08/2020.    Surgical History: Past Surgical History:  Procedure Laterality Date   ANTERIOR CERVICAL DISCECTOMY     APPENDECTOMY     BACK SURGERY     COLONOSCOPY  August 2015   elbow surgery     HEMORRHOID SURGERY     LAPAROSCOPIC APPENDECTOMY N/A 12/04/2017   Procedure: APPENDECTOMY LAPAROSCOPIC;  Surgeon: Vickie Epley, MD;  Location: [facility] ORS;  Service: General;  Laterality: N/A;   LIPOMA EXCISION Right    leg   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     SPINAL CORD STIMULATOR IMPLANT     UPPER GI ENDOSCOPY  August 2015    Medical History: Past Medical History:  Diagnosis Date   Acute appendicitis 12/04/2017   Allergy    takes allergy shots   Anemia    Arthritis    Asthma    Barrett esophagus    Bronchitis    Chronic pain    Colon polyp    Depression    GERD (gastroesophageal  reflux disease)    Hemorrhoid    Hyperlipidemia    Hypertension    Hypothyroid    IBS (irritable bowel syndrome)    Kidney stone    MI (myocardial infarction) ([place])    between 2002 and 2006   Migraine    Pneumonia    Seizures ([place])    Sinus problem    Spinal cord stimulator status     Family History: Family History  Problem Relation Age of Onset   Hypertension Mother    Lung disease Mother    Heart attack Father    Diabetes Father     Social History   Socioeconomic History   Marital status: Married    Spouse name: Not on file   Number of children: Not on  file   Years of education: Not on file   Highest education level: Not on file  Occupational History   Not on file  Tobacco Use   Smoking status: Former    Packs/day: 1.00    Years: 30.00    Pack years: 30.00    Types: Cigarettes    Quit date: 02/22/2005    Years since quitting: 15.6   Smokeless tobacco: Never  Vaping Use   Vaping Use: Never used  Substance and Sexual Activity   Alcohol use: Yes    Alcohol/week: 0.0 standard drinks    Comment: occasionally   Drug use: No    Comment: past   Sexual activity: Yes  Other Topics Concern   Not on file  Social History Narrative   Not on file   Social Determinants of Health   Financial Resource Strain: Not on file  Food Insecurity: Not on file  Transportation Needs: Not on file  Physical Activity: Not on file  Stress: Not on file  Social Connections: Not on file  Intimate Partner Violence: Not on file      Review of Systems  Constitutional:  Negative for chills, fatigue and unexpected weight change.  HENT:  Negative for congestion, rhinorrhea, sneezing and sore throat.   Eyes:  Negative for redness.  Respiratory:  Negative for cough, chest tightness and shortness of breath.   Cardiovascular:  Negative for chest pain and palpitations.  Gastrointestinal:  Negative for abdominal pain, constipation, diarrhea, nausea and vomiting.  Genitourinary:   Negative for dysuria and frequency.  Musculoskeletal:  Negative for arthralgias, back pain, joint swelling and neck pain.  Skin:  Negative for rash.  Neurological: Negative.  Negative for tremors and numbness.  Hematological:  Negative for adenopathy. Does not bruise/bleed easily.  Psychiatric/Behavioral:  Negative for behavioral problems (Depression), sleep disturbance and suicidal ideas. The patient is not nervous/anxious.    Vital Signs: BP 120/72   Pulse 70   Temp 97.8 F (36.6 C)   Resp 16   Ht 6' (1.829 m)   Wt 210 lb 3.2 oz (95.3 kg)   SpO2 98%   BMI 28.51 kg/m    Physical Exam Vitals reviewed.  Constitutional:      General: He is not in acute distress.    Appearance: Normal appearance. He is not ill-appearing.  HENT:     Head: Normocephalic and atraumatic.  Eyes:     Extraocular Movements: Extraocular movements intact.     Pupils: Pupils are equal, round, and reactive to light.  Cardiovascular:     Rate and Rhythm: Normal rate and regular rhythm.  Pulmonary:     Effort: Pulmonary effort is normal. No respiratory distress.     Breath sounds: Normal breath sounds.  Skin:    General: Skin is warm and dry.  Neurological:     Mental Status: He is alert and oriented to person, place, and time.     Sensory: No sensory deficit.     Motor: No weakness.     Gait: Gait normal.  Psychiatric:        Mood and Affect: Mood normal.        Behavior: Behavior normal.    Assessment/Plan: 1. Essential hypertension Stable, continue medication as prescribed  2. Acquired hypothyroidism Stable, refill ordered - levothyroxine (SYNTHROID) 150 MCG tablet; Take 1 tablet (150 mcg total) by mouth daily before breakfast.  Dispense: 90 tablet; Refill: 1  3. Benign prostatic hyperplasia with lower urinary tract symptoms, symptom details unspecified  Stable with current medication, refill ordered.  - alfuzosin (UROXATRAL) 10 MG 24 hr tablet; Take 1 tablet (10 mg total) by mouth daily  with breakfast.  Dispense: 90 tablet; Refill: 1  4. Narcolepsy due to underlying condition without cataplexy Was originally on modafinil but his insurance will not cover it, adderall is working well but not as well as modafinil per patient report, refills ordered.  - amphetamine-dextroamphetamine (ADDERALL) 20 MG tablet; Take 1 tablet (20 mg total) by mouth 3 (three) times daily.  Dispense: 90 tablet; Refill: 0 - amphetamine-dextroamphetamine (ADDERALL) 20 MG tablet; Take 1 tablet (20 mg total) by mouth 3 (three) times daily.  Dispense: 90 tablet; Refill: 0 - amphetamine-dextroamphetamine (ADDERALL) 20 MG tablet; Take 1 tablet (20 mg total) by mouth 3 (three) times daily.  Dispense: 90 tablet; Refill: 0  5. Generalized anxiety disorder with panic attacks Stable with current medications, refill ordered - ALPRAZolam (XANAX) 0.5 MG tablet; Take 1 tablet (0.5 mg total) by mouth 3 (three) times daily as needed. for anxiety  Dispense: 90 tablet; Refill: 0  6. Encounter for long-term (current) use of high-risk medication Positive for amphetamine and oxycodone which is consistent with prescriptions, he gets oxycodone through pain management.  - POCT Urine Drug Screen  7. Need for vaccination - Zoster Vaccine Adjuvanted [facility]) injection; Inject 0.5 mLs into the muscle once for 1 dose.  Dispense: 0.5 mL; Refill: 0 - Tdap (BOOSTRIX) 5-2.5-18.5 LF-MCG/0.5 injection; Inject 0.5 mLs into the muscle once for 1 dose.  Dispense: 0.5 mL; Refill: 0 - pneumococcal 20-Val Conj Vacc (PREVNAR 20) 0.5 ML injection; Inject 0.5 mLs into the muscle tomorrow at 10 am for 1 dose.  Dispense: 0.5 mL; Refill: 0   General Counseling: Randy Mclean verbalizes understanding of the findings of todays visit and agrees with plan of treatment. I have discussed any further diagnostic evaluation that may be needed or ordered today. We also reviewed his medications today. he has been encouraged to call the office with any questions or  concerns that should arise related to todays visit.    Orders Placed This Encounter  Procedures   POCT Urine Drug Screen    Meds ordered this encounter  Medications   Zoster Vaccine Adjuvanted [facility]) injection    Sig: Inject 0.5 mLs into the muscle once for 1 dose.    Dispense:  0.5 mL    Refill:  0   Tdap (BOOSTRIX) 5-2.5-18.5 LF-MCG/0.5 injection    Sig: Inject 0.5 mLs into the muscle once for 1 dose.    Dispense:  0.5 mL    Refill:  0   pneumococcal 20-Val Conj Vacc (PREVNAR 20) 0.5 ML injection    Sig: Inject 0.5 mLs into the muscle tomorrow at 10 am for 1 dose.    Dispense:  0.5 mL    Refill:  0   ALPRAZolam (XANAX) 0.5 MG tablet    Sig: Take 1 tablet (0.5 mg total) by mouth 3 (three) times daily as needed. for anxiety    Dispense:  90 tablet    Refill:  0   amphetamine-dextroamphetamine (ADDERALL) 20 MG tablet    Sig: Take 1 tablet (20 mg total) by mouth 3 (three) times daily.    Dispense:  90 tablet    Refill:  0    Fill for October.   levothyroxine (SYNTHROID) 150 MCG tablet    Sig: Take 1 tablet (150 mcg total) by mouth daily before breakfast.    Dispense:  90 tablet  Refill:  1   alfuzosin (UROXATRAL) 10 MG 24 hr tablet    Sig: Take 1 tablet (10 mg total) by mouth daily with breakfast.    Dispense:  90 tablet    Refill:  1   amphetamine-dextroamphetamine (ADDERALL) 20 MG tablet    Sig: Take 1 tablet (20 mg total) by mouth 3 (three) times daily.    Dispense:  90 tablet    Refill:  0    Fill for september   amphetamine-dextroamphetamine (ADDERALL) 20 MG tablet    Sig: Take 1 tablet (20 mg total) by mouth 3 (three) times daily.    Dispense:  90 tablet    Refill:  0    Return for previously scheduled, CPE, Rangel Echeverri PCP in september.   Total time spent:30 Minutes Time spent includes review of chart, medications, test results, and follow up plan with the patient.   [place] Controlled Substance Database was reviewed by me.  This patient was seen by Jonetta Osgood, FNP-C in collaboration with Dr. Clayborn Bigness as a part of collaborative care agreement.   Alvah Lagrow R. Valetta Fuller, MSN, FNP-C Internal medicine

## 2020-10-10 LAB — POCT URINE DRUG SCREEN
Methylenedioxyamphetamine: NOT DETECTED
POC Amphetamine UR: POSITIVE — AB
POC BENZODIAZEPINES UR: NOT DETECTED
POC Barbiturate UR: NOT DETECTED
POC Cocaine UR: NOT DETECTED
POC Ecstasy UR: NOT DETECTED
POC Marijuana UR: NOT DETECTED
POC Methadone UR: NOT DETECTED
POC Methamphetamine UR: NOT DETECTED
POC Opiate Ur: NOT DETECTED
POC Oxycodone UR: POSITIVE — AB
POC PHENCYCLIDINE UR: NOT DETECTED
POC TRICYCLICS UR: NOT DETECTED

## 2020-10-11 ENCOUNTER — Other Ambulatory Visit: Payer: Self-pay | Admitting: Internal Medicine

## 2020-10-16 DIAGNOSIS — Z79891 Long term (current) use of opiate analgesic: Secondary | ICD-10-CM | POA: Diagnosis not present

## 2020-10-16 DIAGNOSIS — M961 Postlaminectomy syndrome, not elsewhere classified: Secondary | ICD-10-CM | POA: Diagnosis not present

## 2020-10-16 DIAGNOSIS — G894 Chronic pain syndrome: Secondary | ICD-10-CM | POA: Diagnosis not present

## 2020-10-16 DIAGNOSIS — M4722 Other spondylosis with radiculopathy, cervical region: Secondary | ICD-10-CM | POA: Diagnosis not present

## 2020-11-04 ENCOUNTER — Encounter: Payer: Self-pay | Admitting: Nurse Practitioner

## 2020-11-04 ENCOUNTER — Ambulatory Visit (INDEPENDENT_AMBULATORY_CARE_PROVIDER_SITE_OTHER): Payer: BC Managed Care – PPO | Admitting: Nurse Practitioner

## 2020-11-04 ENCOUNTER — Other Ambulatory Visit: Payer: Self-pay

## 2020-11-04 VITALS — BP 128/70 | HR 90 | Temp 98.5°F | Resp 16 | Ht 72.0 in | Wt 210.4 lb

## 2020-11-04 DIAGNOSIS — G47429 Narcolepsy in conditions classified elsewhere without cataplexy: Secondary | ICD-10-CM | POA: Diagnosis not present

## 2020-11-04 DIAGNOSIS — F410 Panic disorder [episodic paroxysmal anxiety] without agoraphobia: Secondary | ICD-10-CM

## 2020-11-04 DIAGNOSIS — I10 Essential (primary) hypertension: Secondary | ICD-10-CM

## 2020-11-04 DIAGNOSIS — F411 Generalized anxiety disorder: Secondary | ICD-10-CM

## 2020-11-04 DIAGNOSIS — D509 Iron deficiency anemia, unspecified: Secondary | ICD-10-CM

## 2020-11-04 DIAGNOSIS — Z0001 Encounter for general adult medical examination with abnormal findings: Secondary | ICD-10-CM | POA: Diagnosis not present

## 2020-11-04 DIAGNOSIS — E039 Hypothyroidism, unspecified: Secondary | ICD-10-CM | POA: Diagnosis not present

## 2020-11-04 DIAGNOSIS — R300 Dysuria: Secondary | ICD-10-CM

## 2020-11-04 NOTE — Progress Notes (Signed)
[facility] [address] [place], [place] [address]  Internal MEDICINE  Office Visit Note  Patient Name: Randy Mclean  [id number]  [id number]  Date of Service: 11/04/2020  Chief Complaint  Patient presents with   Annual Exam    Spot on back of left arm, discuss meds   Depression   Gastroesophageal Reflux   Hyperlipidemia   Hypertension   Anemia   Asthma   Quality Metric Gaps    Has gotten 1st shingrix, and flu, will be getting pneumovax and second shingrix and tdap     HPI Randy Mclean presents for an annual well visit and physical exam. he has a history of hypertension, narcolepsy, hypothyroidism, GERD, COPD, asthma, angina pectoris, bilateral carotid stenosis, carpal tunnel syndrome, chronic back pain, inflammatory polyarthritis, BPH, anxiety and depression. He recently received the first dose of the shingles vaccine and the Prevnar 20 vaccine at his pharmacy. He has received 3 doses of the COVID vaccine. He lives at home with his husband. He works full time. He had labs done a few months ago and will have labs drawn this Friday when he sees his hematologist, next Thursday for work, and then again near the ned of the month for pre-op testing. He has chronic back pain and is scheduled for back surgery on 12/04/20. He is then scheduled for surgery on his left thumb on November 23rd.  -He is not due for routine screening colonoscopy until 2025. He has had his PSA level tested and it is wnl.  -Followed by hematology for anemia. -He has no new pains. He has no other questions or concerns.   Current Medication: Outpatient Encounter Medications as of 11/04/2020  Medication Sig   albuterol (VENTOLIN HFA) 108 (90 Base) MCG/ACT inhaler Inhale 2 puffs into the lungs every 4 (four) hours as needed for wheezing or shortness of breath.   alfuzosin (UROXATRAL) 10 MG 24 hr tablet TAKE 1 TABLET BY MOUTH DAILY   alfuzosin (UROXATRAL) 10 MG 24 hr tablet Take 1 tablet (10 mg total) by  mouth daily with breakfast.   ALPRAZolam (XANAX) 0.5 MG tablet Take 1 tablet (0.5 mg total) by mouth 3 (three) times daily as needed. for anxiety   amLODipine (NORVASC) 5 MG tablet Take 10 mg by mouth daily.   amphetamine-dextroamphetamine (ADDERALL) 20 MG tablet Take 1 tablet (20 mg total) by mouth 3 (three) times daily.   [START ON 11/05/2020] amphetamine-dextroamphetamine (ADDERALL) 20 MG tablet Take 1 tablet (20 mg total) by mouth 3 (three) times daily.   [START ON 12/03/2020] amphetamine-dextroamphetamine (ADDERALL) 20 MG tablet Take 1 tablet (20 mg total) by mouth 3 (three) times daily.   atorvastatin (LIPITOR) 10 MG tablet Take 1 tablet (10 mg total) by mouth daily.   buPROPion (WELLBUTRIN XL) 300 MG 24 hr tablet TAKE 1 TABLET BY MOUTH DAILY, GENERIC EQUIVALENT FOR WELLBUTRIN XL. PATIENT DUE FOR OFFICE VISIT.   citalopram (CELEXA) 20 MG tablet Take 1 tablet (20 mg total) by mouth daily.   esomeprazole (NEXIUM) 40 MG capsule TK ONE C PO  BID UTD   hydrochlorothiazide (HYDRODIURIL) 12.5 MG tablet Take by mouth.   l-methylfolate-B6-B12 (METANX) 3-35-2 MG TABS Take 1 tablet by mouth 2 (two) times daily.   levothyroxine (SYNTHROID) 150 MCG tablet Take 1 tablet (150 mcg total) by mouth daily before breakfast.   meloxicam (MOBIC) 7.5 MG tablet Take 1 tablet (7.5 mg total) by mouth 2 (two) times daily.   methocarbamol (ROBAXIN) 750 MG tablet Take 750  mg by mouth 4 (four) times daily as needed.   mometasone-formoterol (DULERA) 100-5 MCG/ACT AERO INHALE 2 PUFFS BY MOUTH INTO THE LUNGS EVERY 12 HOURS   MOVANTIK 25 MG TABS tablet Take 25 mg by mouth daily.   Multiple Vitamin (MULTI-VITAMINS) TABS Take by mouth.   NUCYNTA ER 200 MG TB12 200 mg bid   oxyCODONE-acetaminophen (PERCOCET) 7.5-325 MG per tablet Take 1 tablet by mouth every 4 (four) hours as needed for pain.    pregabalin (LYRICA) 100 MG capsule Take 100 mg by mouth every 8 (eight) hours.   pregabalin (LYRICA) 50 MG capsule Take 100 mg by  mouth 3 (three) times daily.    SYRINGE-NEEDLE, DISP, 3 ML 22G X 1" 3 ML MISC Use as directed.   testosterone cypionate (DEPOTESTOSTERONE CYPIONATE) 200 MG/ML injection INJECT 0.75 MLS INTO THE MUSCLE EVERY 7 DAYS AS DIRECTED   tiZANidine (ZANAFLEX) 4 MG tablet Take 4 mg by mouth every 6 (six) hours as needed for muscle spasms. Pt takes one tablet at night   valsartan (DIOVAN) 160 MG tablet Take 160 mg by mouth daily.   Vitamin D, Ergocalciferol, (DRISDOL) 1.25 MG (50000 UNIT) CAPS capsule TAKE 1 CAPSULE BY MOUTH EVERY 7 DAYS   [DISCONTINUED] ALPRAZolam (XANAX) 0.5 MG tablet Take 1 tablet (0.5 mg total) by mouth 3 (three) times daily as needed. for anxiety   [DISCONTINUED] amphetamine-dextroamphetamine (ADDERALL) 20 MG tablet Take 1 tablet (20 mg total) by mouth 3 (three) times daily.   [DISCONTINUED] amphetamine-dextroamphetamine (ADDERALL) 20 MG tablet Take 1 tablet (20 mg total) by mouth in the morning, at noon, and at bedtime.   [DISCONTINUED] amphetamine-dextroamphetamine (ADDERALL) 20 MG tablet Take 1 tablet (20 mg total) by mouth in the morning, at noon, and at bedtime.   [DISCONTINUED] atorvastatin (LIPITOR) 10 MG tablet Take 1 tablet (10 mg total) by mouth daily.   [DISCONTINUED] azelastine (ASTELIN) 0.1 % nasal spray USE 1 SPRAY NASALLY 2 TIMES A DAY   [DISCONTINUED] buPROPion (WELLBUTRIN XL) 300 MG 24 hr tablet TAKE 1 TABLET BY MOUTH DAILY, GENERIC EQUIVALENT FOR WELLBUTRIN XL. PATIENT DUE FOR OFFICE VISIT.   [DISCONTINUED] celecoxib (CELEBREX) 200 MG capsule Take 200 mg by mouth 2 (two) times daily.   [DISCONTINUED] chlorpheniramine-HYDROcodone (TUSSIONEX PENNKINETIC ER) 10-8 MG/5ML SUER Take 5 mLs by mouth every 12 (twelve) hours as needed for cough.   [DISCONTINUED] citalopram (CELEXA) 20 MG tablet TAKE 1 TABLET BY MOUTH DAILY   [DISCONTINUED] clotrimazole-betamethasone (LOTRISONE) cream Apply 1 application topically 2 (two) times daily.   [DISCONTINUED] levofloxacin (LEVAQUIN) 500 MG  tablet Take 1 tablet (500 mg total) by mouth daily.   [DISCONTINUED] Levothyroxine Sodium (TIROSINT) 200 MCG CAPS Take 1 capsule by mouth daily.   [DISCONTINUED] losartan (COZAAR) 50 MG tablet Take 1 tablet (50 mg total) by mouth daily. (Patient not taking: Reported on 09/08/2020)   [DISCONTINUED] meloxicam (MOBIC) 7.5 MG tablet Take 1 tablet (7.5 mg total) by mouth 2 (two) times daily.   [DISCONTINUED] metoprolol succinate (TOPROL-XL) 25 MG 24 hr tablet TAKE 1 TABLET BY MOUTH DAILY. GENERIC EQUIVALENT FOR TOPROL XL   [DISCONTINUED] mometasone-formoterol (DULERA) 100-5 MCG/ACT AERO INHALE 2 PUFFS BY MOUTH INTO THE LUNGS EVERY 12 HOURS   [DISCONTINUED] predniSONE (DELTASONE) 10 MG tablet Use per dose pack   [DISCONTINUED] testosterone cypionate (DEPOTESTOSTERONE CYPIONATE) 200 MG/ML injection INJECT 0.75 MLS ('75MG'$  TOTAL) INTO THE MUSCLE EVERY 7 DAYS AS DIRECTED (Patient taking differently: INJECT 0.75 MLS ('150MG'$  TOTAL) INTO THE MUSCLE EVERY 7 DAYS AS DIRECTED)   [  DISCONTINUED] tiZANidine (ZANAFLEX) 2 MG tablet TAKE 1 TABLET(2 MG) BY MOUTH AT BEDTIME (Patient taking differently: 4 mg.)   No facility-administered encounter medications on file as of 11/04/2020.    Surgical History: Past Surgical History:  Procedure Laterality Date   ANTERIOR CERVICAL DISCECTOMY     APPENDECTOMY     BACK SURGERY     COLONOSCOPY  August 2015   elbow surgery     HEMORRHOID SURGERY     LAPAROSCOPIC APPENDECTOMY N/A 12/04/2017   Procedure: APPENDECTOMY LAPAROSCOPIC;  Surgeon: Vickie Epley, MD;  Location: [facility] ORS;  Service: General;  Laterality: N/A;   LIPOMA EXCISION Right    leg   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     SPINAL CORD STIMULATOR IMPLANT     UPPER GI ENDOSCOPY  August 2015    Medical History: Past Medical History:  Diagnosis Date   Acute appendicitis 12/04/2017   Allergy    takes allergy shots   Anemia    Arthritis    Asthma    Barrett esophagus    Bronchitis    Chronic pain     Colon polyp    Depression    GERD (gastroesophageal reflux disease)    Hemorrhoid    Hyperlipidemia    Hypertension    Hypothyroid    IBS (irritable bowel syndrome)    Kidney stone    MI (myocardial infarction) ([place])    between 2002 and 2006   Migraine    Pneumonia    Seizures ([place])    Sinus problem    Spinal cord stimulator status     Family History: Family History  Problem Relation Age of Onset   Hypertension Mother    Lung disease Mother    Heart attack Father    Diabetes Father     Social History   Socioeconomic History   Marital status: Married    Spouse name: Not on file   Number of children: Not on file   Years of education: Not on file   Highest education level: Not on file  Occupational History   Not on file  Tobacco Use   Smoking status: Former    Packs/day: 1.00    Years: 30.00    Pack years: 30.00    Types: Cigarettes    Quit date: 02/22/2005    Years since quitting: 15.7   Smokeless tobacco: Never  Vaping Use   Vaping Use: Never used  Substance and Sexual Activity   Alcohol use: Yes    Alcohol/week: 0.0 standard drinks    Comment: occasionally   Drug use: No    Comment: past   Sexual activity: Yes  Other Topics Concern   Not on file  Social History Narrative   Not on file   Social Determinants of Health   Financial Resource Strain: Not on file  Food Insecurity: Not on file  Transportation Needs: Not on file  Physical Activity: Not on file  Stress: Not on file  Social Connections: Not on file  Intimate Partner Violence: Not on file      Review of Systems  Constitutional:  Negative for activity change, appetite change, chills, fatigue, fever and unexpected weight change.  HENT: Negative.  Negative for congestion, ear pain, rhinorrhea, sore throat and trouble swallowing.   Eyes: Negative.   Respiratory: Negative.  Negative for cough, chest tightness, shortness of breath and wheezing.   Cardiovascular: Negative.  Negative for  chest pain.  Gastrointestinal: Negative.  Negative for abdominal pain,  blood in stool, constipation, diarrhea, nausea and vomiting.  Endocrine: Negative.   Genitourinary: Negative.  Negative for difficulty urinating, dysuria, frequency, hematuria and urgency.  Musculoskeletal: Negative.  Negative for arthralgias, back pain, joint swelling, myalgias and neck pain.  Skin: Negative.  Negative for rash and wound.  Allergic/Immunologic: Negative.  Negative for immunocompromised state.  Neurological: Negative.  Negative for dizziness, seizures, numbness and headaches.  Hematological: Negative.   Psychiatric/Behavioral:  Positive for depression. Negative for behavioral problems, self-injury and suicidal ideas. The patient is not nervous/anxious.    Vital Signs: BP 128/70   Pulse 90   Temp 98.5 F (36.9 C)   Resp 16   Ht 6' (1.829 m)   Wt 210 lb 6.4 oz (95.4 kg)   SpO2 98%   BMI 28.54 kg/m    Physical Exam Vitals reviewed.  Constitutional:      General: He is awake. He is not in acute distress.    Appearance: Normal appearance. He is well-developed, well-groomed and overweight. He is not ill-appearing or diaphoretic.  HENT:     Head: Normocephalic and atraumatic.     Right Ear: Tympanic membrane, ear canal and external ear normal.     Left Ear: Tympanic membrane, ear canal and external ear normal.     Nose: Nose normal. No congestion or rhinorrhea.     Mouth/Throat:     Lips: Pink.     Mouth: Mucous membranes are moist.     Pharynx: Oropharynx is clear. Uvula midline. No oropharyngeal exudate or posterior oropharyngeal erythema.  Eyes:     General: Lids are normal. Vision grossly intact. Gaze aligned appropriately. No scleral icterus.       Right eye: No discharge.        Left eye: No discharge.     Extraocular Movements: Extraocular movements intact.     Conjunctiva/sclera: Conjunctivae normal.     Pupils: Pupils are equal, round, and reactive to light.     Funduscopic exam:     Right eye: Red reflex present.        Left eye: Red reflex present. Neck:     Thyroid: No thyromegaly.     Vascular: No carotid bruit or JVD.     Trachea: Trachea and phonation normal. No tracheal deviation.  Cardiovascular:     Rate and Rhythm: Normal rate and regular rhythm.     Pulses:          Carotid pulses are 3+ on the right side and 3+ on the left side.      Radial pulses are 2+ on the right side and 2+ on the left side.       Dorsalis pedis pulses are 2+ on the right side and 2+ on the left side.       Posterior tibial pulses are 2+ on the right side and 2+ on the left side.     Heart sounds: Normal heart sounds and S1 normal. No murmur heard.   No friction rub. No gallop.  Pulmonary:     Effort: Pulmonary effort is normal. No accessory muscle usage or respiratory distress.     Breath sounds: Normal breath sounds and air entry. No stridor. No wheezing or rales.  Chest:     Chest wall: No tenderness.  Abdominal:     General: Bowel sounds are normal. There is no distension.     Palpations: Abdomen is soft. There is no shifting dullness, fluid wave, mass or pulsatile mass.  Tenderness: There is no abdominal tenderness. There is no guarding or rebound.  Musculoskeletal:        General: No tenderness or deformity. Normal range of motion.     Cervical back: Normal range of motion and neck supple.     Right lower leg: No edema.     Left lower leg: No edema.  Lymphadenopathy:     Cervical: No cervical adenopathy.  Skin:    General: Skin is warm and dry.     Capillary Refill: Capillary refill takes less than 2 seconds.     Coloration: Skin is not pale.     Findings: No erythema or rash.  Neurological:     Mental Status: He is alert and oriented to person, place, and time.     Cranial Nerves: No cranial nerve deficit.     Motor: No abnormal muscle tone.     Coordination: Coordination normal.     Gait: Gait normal.     Deep Tendon Reflexes: Reflexes are normal and  symmetric.  Psychiatric:        Mood and Affect: Mood and affect normal.        Behavior: Behavior normal. Behavior is cooperative.        Thought Content: Thought content normal.        Judgment: Judgment normal.     Assessment/Plan: 1. Encounter for general adult medical examination with abnormal findings Age-appropriate preventive screenings and vaccinations discussed, annual physical exam completed. Routine labs for health maintenance done a few months ago. No labs will be ordered. He is seeing hematology on Friday and will have labs drawn. He will have labs drawn for work next Thursday and then his pre-op testing day for upcoming back surgery is near the end of the month and he will have another set of labs drawn then. PHM updated. Due for colonoscopy in 2025.He has received the Prevnar 20 vaccine and the first dose of the shingles vaccine.   2. Essential hypertension Stable, taking valsartan and amlodipine.  3. Acquired hypothyroidism Taking levothyroxine 150 mcg daily. He will have thyroid level checked with his labs next and medication dose will be adjusted if necessary.   4. Narcolepsy due to underlying condition without cataplexy Was originally taking modafinil but his insurance will not cover it. He is managing ok with adderall 20 mg 3 times daily. ORS 480.  5. Iron deficiency anemia, unspecified iron deficiency anemia type Followed by hematology, next office visit is 11/07/20  5. Generalized anxiety disorder with panic attacks Stable with current medication.   6. Dysuria Routine urinalysis done.  - UA/M w/rflx Culture, Routine    General Counseling: Ramonte verbalizes understanding of the findings of todays visit and agrees with plan of treatment. I have discussed any further diagnostic evaluation that may be needed or ordered today. We also reviewed his medications today. he has been encouraged to call the office with any questions or concerns that should arise related to  todays visit.    Orders Placed This Encounter  Procedures   UA/M w/rflx Culture, Routine    No orders of the defined types were placed in this encounter.   Return in about 3 months (around 02/03/2021) for F/U, med refill, Lynwood Kubisiak PCP.   Total time spent:30 Minutes Time spent includes review of chart, medications, test results, and follow up plan with the patient.   [place] Controlled Substance Database was reviewed by me.  This patient was seen by Jonetta Osgood, FNP-C in collaboration with Dr.  Clayborn Bigness as a part of collaborative care agreement.  Gricel Copen R. Valetta Fuller, MSN, FNP-C Internal medicine

## 2020-11-05 ENCOUNTER — Encounter: Payer: Self-pay | Admitting: Oncology

## 2020-11-05 LAB — UA/M W/RFLX CULTURE, ROUTINE
Bilirubin, UA: NEGATIVE
Glucose, UA: NEGATIVE
Ketones, UA: NEGATIVE
Leukocytes,UA: NEGATIVE
Nitrite, UA: NEGATIVE
Protein,UA: NEGATIVE
RBC, UA: NEGATIVE
Specific Gravity, UA: 1.017 (ref 1.005–1.030)
Urobilinogen, Ur: 0.2 mg/dL (ref 0.2–1.0)
pH, UA: 5.5 (ref 5.0–7.5)

## 2020-11-05 LAB — MICROSCOPIC EXAMINATION
Bacteria, UA: NONE SEEN
Casts: NONE SEEN /lpf
Epithelial Cells (non renal): NONE SEEN /hpf (ref 0–10)
RBC, Urine: NONE SEEN /hpf (ref 0–2)
WBC, UA: NONE SEEN /hpf (ref 0–5)

## 2020-11-07 ENCOUNTER — Encounter: Payer: Self-pay | Admitting: Oncology

## 2020-11-07 ENCOUNTER — Inpatient Hospital Stay: Payer: BC Managed Care – PPO | Attending: Oncology

## 2020-11-07 DIAGNOSIS — R5383 Other fatigue: Secondary | ICD-10-CM | POA: Diagnosis not present

## 2020-11-07 DIAGNOSIS — F419 Anxiety disorder, unspecified: Secondary | ICD-10-CM | POA: Diagnosis not present

## 2020-11-07 DIAGNOSIS — J45909 Unspecified asthma, uncomplicated: Secondary | ICD-10-CM | POA: Diagnosis not present

## 2020-11-07 DIAGNOSIS — M5416 Radiculopathy, lumbar region: Secondary | ICD-10-CM | POA: Insufficient documentation

## 2020-11-07 DIAGNOSIS — G5600 Carpal tunnel syndrome, unspecified upper limb: Secondary | ICD-10-CM | POA: Insufficient documentation

## 2020-11-07 DIAGNOSIS — I10 Essential (primary) hypertension: Secondary | ICD-10-CM | POA: Insufficient documentation

## 2020-11-07 DIAGNOSIS — Z836 Family history of other diseases of the respiratory system: Secondary | ICD-10-CM | POA: Insufficient documentation

## 2020-11-07 DIAGNOSIS — E039 Hypothyroidism, unspecified: Secondary | ICD-10-CM | POA: Diagnosis not present

## 2020-11-07 DIAGNOSIS — M2550 Pain in unspecified joint: Secondary | ICD-10-CM | POA: Insufficient documentation

## 2020-11-07 DIAGNOSIS — K219 Gastro-esophageal reflux disease without esophagitis: Secondary | ICD-10-CM | POA: Diagnosis not present

## 2020-11-07 DIAGNOSIS — D509 Iron deficiency anemia, unspecified: Secondary | ICD-10-CM | POA: Insufficient documentation

## 2020-11-07 DIAGNOSIS — J449 Chronic obstructive pulmonary disease, unspecified: Secondary | ICD-10-CM | POA: Diagnosis not present

## 2020-11-07 DIAGNOSIS — Z87891 Personal history of nicotine dependence: Secondary | ICD-10-CM | POA: Insufficient documentation

## 2020-11-07 DIAGNOSIS — Z79899 Other long term (current) drug therapy: Secondary | ICD-10-CM | POA: Insufficient documentation

## 2020-11-07 DIAGNOSIS — M791 Myalgia, unspecified site: Secondary | ICD-10-CM | POA: Insufficient documentation

## 2020-11-07 DIAGNOSIS — Z8249 Family history of ischemic heart disease and other diseases of the circulatory system: Secondary | ICD-10-CM | POA: Diagnosis not present

## 2020-11-07 DIAGNOSIS — M549 Dorsalgia, unspecified: Secondary | ICD-10-CM | POA: Insufficient documentation

## 2020-11-07 DIAGNOSIS — K921 Melena: Secondary | ICD-10-CM | POA: Diagnosis not present

## 2020-11-07 DIAGNOSIS — Z833 Family history of diabetes mellitus: Secondary | ICD-10-CM | POA: Insufficient documentation

## 2020-11-07 DIAGNOSIS — M48061 Spinal stenosis, lumbar region without neurogenic claudication: Secondary | ICD-10-CM | POA: Diagnosis not present

## 2020-11-07 LAB — CBC WITH DIFFERENTIAL/PLATELET
Abs Immature Granulocytes: 0.01 10*3/uL (ref 0.00–0.07)
Basophils Absolute: 0.1 10*3/uL (ref 0.0–0.1)
Basophils Relative: 1 %
Eosinophils Absolute: 0.1 10*3/uL (ref 0.0–0.5)
Eosinophils Relative: 2 %
HCT: 35 % — ABNORMAL LOW (ref 39.0–52.0)
Hemoglobin: 11 g/dL — ABNORMAL LOW (ref 13.0–17.0)
Immature Granulocytes: 0 %
Lymphocytes Relative: 25 %
Lymphs Abs: 1.6 10*3/uL (ref 0.7–4.0)
MCH: 25.6 pg — ABNORMAL LOW (ref 26.0–34.0)
MCHC: 31.4 g/dL (ref 30.0–36.0)
MCV: 81.6 fL (ref 80.0–100.0)
Monocytes Absolute: 0.6 10*3/uL (ref 0.1–1.0)
Monocytes Relative: 10 %
Neutro Abs: 4 10*3/uL (ref 1.7–7.7)
Neutrophils Relative %: 62 %
Platelets: 246 10*3/uL (ref 150–400)
RBC: 4.29 MIL/uL (ref 4.22–5.81)
RDW: 15 % (ref 11.5–15.5)
WBC: 6.4 10*3/uL (ref 4.0–10.5)
nRBC: 0 % (ref 0.0–0.2)

## 2020-11-07 LAB — IRON AND TIBC
Iron: 28 ug/dL — ABNORMAL LOW (ref 45–182)
Saturation Ratios: 6 % — ABNORMAL LOW (ref 17.9–39.5)
TIBC: 442 ug/dL (ref 250–450)
UIBC: 414 ug/dL

## 2020-11-07 LAB — FERRITIN: Ferritin: 6 ng/mL — ABNORMAL LOW (ref 24–336)

## 2020-11-10 ENCOUNTER — Inpatient Hospital Stay: Payer: BC Managed Care – PPO

## 2020-11-10 ENCOUNTER — Inpatient Hospital Stay (HOSPITAL_BASED_OUTPATIENT_CLINIC_OR_DEPARTMENT_OTHER): Payer: BC Managed Care – PPO | Admitting: Oncology

## 2020-11-10 VITALS — BP 141/95 | HR 84 | Temp 98.2°F | Resp 18 | Wt 209.8 lb

## 2020-11-10 DIAGNOSIS — K219 Gastro-esophageal reflux disease without esophagitis: Secondary | ICD-10-CM | POA: Diagnosis not present

## 2020-11-10 DIAGNOSIS — R5383 Other fatigue: Secondary | ICD-10-CM | POA: Diagnosis not present

## 2020-11-10 DIAGNOSIS — M48061 Spinal stenosis, lumbar region without neurogenic claudication: Secondary | ICD-10-CM | POA: Diagnosis not present

## 2020-11-10 DIAGNOSIS — D509 Iron deficiency anemia, unspecified: Secondary | ICD-10-CM

## 2020-11-10 DIAGNOSIS — F419 Anxiety disorder, unspecified: Secondary | ICD-10-CM | POA: Diagnosis not present

## 2020-11-10 DIAGNOSIS — M2550 Pain in unspecified joint: Secondary | ICD-10-CM | POA: Diagnosis not present

## 2020-11-10 DIAGNOSIS — M5416 Radiculopathy, lumbar region: Secondary | ICD-10-CM | POA: Diagnosis not present

## 2020-11-10 DIAGNOSIS — K921 Melena: Secondary | ICD-10-CM | POA: Diagnosis not present

## 2020-11-10 DIAGNOSIS — J45909 Unspecified asthma, uncomplicated: Secondary | ICD-10-CM | POA: Diagnosis not present

## 2020-11-10 DIAGNOSIS — Z79899 Other long term (current) drug therapy: Secondary | ICD-10-CM | POA: Diagnosis not present

## 2020-11-10 DIAGNOSIS — I10 Essential (primary) hypertension: Secondary | ICD-10-CM | POA: Diagnosis not present

## 2020-11-10 DIAGNOSIS — M791 Myalgia, unspecified site: Secondary | ICD-10-CM | POA: Diagnosis not present

## 2020-11-10 DIAGNOSIS — E039 Hypothyroidism, unspecified: Secondary | ICD-10-CM | POA: Diagnosis not present

## 2020-11-10 DIAGNOSIS — M549 Dorsalgia, unspecified: Secondary | ICD-10-CM | POA: Diagnosis not present

## 2020-11-10 DIAGNOSIS — J449 Chronic obstructive pulmonary disease, unspecified: Secondary | ICD-10-CM | POA: Diagnosis not present

## 2020-11-10 DIAGNOSIS — G5600 Carpal tunnel syndrome, unspecified upper limb: Secondary | ICD-10-CM | POA: Diagnosis not present

## 2020-11-10 DIAGNOSIS — D508 Other iron deficiency anemias: Secondary | ICD-10-CM

## 2020-11-10 MED ORDER — IRON SUCROSE 20 MG/ML IV SOLN
200.0000 mg | Freq: Once | INTRAVENOUS | Status: AC
  Administered 2020-11-10: 200 mg via INTRAVENOUS
  Filled 2020-11-10: qty 10

## 2020-11-10 MED ORDER — SODIUM CHLORIDE 0.9 % IV SOLN: 200.0000 mg | Freq: Once | INTRAVENOUS | Status: DC

## 2020-11-10 MED ORDER — SODIUM CHLORIDE 0.9 % IV SOLN
INTRAVENOUS | Status: DC
  Filled 2020-11-10: qty 250

## 2020-11-10 NOTE — Progress Notes (Signed)
Pt has no concerns at this time. 

## 2020-11-10 NOTE — Progress Notes (Signed)
[place]  [phone]) [phone] [phone]  ID: Randy Mclean Younger OB: 1960-11-11  MR#: [id number]  [id number]  Patient Care Team: Randy Osgood, NP as PCP - General (Nurse Practitioner) Randy Bellow, MD (General Surgery) Randy Guise, MD (Internal Medicine) Randy Huger, MD as Consulting Physician (Oncology)  CHIEF COMPLAINT: Iron deficiency anemia.  HPI:  [name] is a 60 year old male with past medical history significant for hypertension, COPD, GERD, asthma, hypothyroidism, back pain with radiculopathy, carpal tunnel syndrome, anxiety who is followed by Dr. Grayland Mclean for iron deficiency anemia.  He receives intermittent IV Venofer last on 07/25/2020.  Interval history- He returns today to discuss lab work and possible IV Venofer.  Reports he started to feel more tired a little over a month ago.  States he can tell when his iron is low.  Reports he will be on FMLA over the holidays for lumbar radiculopathy and L4-L5 stenosis.  This is scheduled for 12/04/2020.  Reports an approximate 1 episode per month of blood in his diarrhea with associated abdominal pain.  He was supposed to have a capsule study but this was postponed secondary to [place].  He has had this problem for years.  REVIEW OF SYSTEMS:   Review of Systems  Constitutional:  Positive for malaise/fatigue. Negative for chills, fever and weight loss.  HENT:  Negative for congestion, ear pain and tinnitus.   Eyes: Negative.  Negative for blurred vision and double vision.  Respiratory: Negative.  Negative for cough, sputum production and shortness of breath.   Cardiovascular: Negative.  Negative for chest pain, palpitations and leg swelling.  Gastrointestinal:  Positive for blood in stool and diarrhea. Negative for abdominal pain, constipation, nausea and vomiting.  Genitourinary:  Negative for dysuria, frequency and urgency.  Musculoskeletal:  Positive for back pain, joint  pain and myalgias. Negative for falls.  Skin: Negative.  Negative for rash.  Neurological: Negative.  Negative for weakness and headaches.  Endo/Heme/Allergies: Negative.  Does not bruise/bleed easily.  Psychiatric/Behavioral: Negative.  Negative for depression. The patient is not nervous/anxious and does not have insomnia.    As per HPI. Otherwise, a complete review of systems is negative.  PAST MEDICAL HISTORY: Past Medical History:  Diagnosis Date   Acute appendicitis 12/04/2017   Allergy    takes allergy shots   Anemia    Arthritis    Asthma    Barrett esophagus    Bronchitis    Chronic pain    Colon polyp    Depression    GERD (gastroesophageal reflux disease)    Hemorrhoid    Hyperlipidemia    Hypertension    Hypothyroid    IBS (irritable bowel syndrome)    Kidney stone    MI (myocardial infarction) ([place])    between 2002 and 2006   Migraine    Pneumonia    Seizures ([place])    Sinus problem    Spinal cord stimulator status     PAST SURGICAL HISTORY: Past Surgical History:  Procedure Laterality Date   ANTERIOR CERVICAL DISCECTOMY     APPENDECTOMY     BACK SURGERY     COLONOSCOPY  August 2015   elbow surgery     HEMORRHOID SURGERY     LAPAROSCOPIC APPENDECTOMY N/A 12/04/2017   Procedure: APPENDECTOMY LAPAROSCOPIC;  Surgeon: Randy Epley, MD;  Location: [facility] ORS;  Service: General;  Laterality: N/A;   LIPOMA EXCISION Right    leg   multiple leg surgery to remove  angiolipoma     SPINAL CORD STIMULATOR IMPLANT     UPPER GI ENDOSCOPY  August 2015    FAMILY HISTORY Family History  Problem Relation Age of Onset   Hypertension Mother    Lung disease Mother    Heart attack Father    Diabetes Father        ADVANCED DIRECTIVES:    HEALTH MAINTENANCE: Social History   Tobacco Use   Smoking status: Former    Packs/day: 1.00    Years: 30.00    Pack years: 30.00    Types: Cigarettes    Quit date: 02/22/2005    Years since quitting: 15.7    Smokeless tobacco: Never  Vaping Use   Vaping Use: Never used  Substance Use Topics   Alcohol use: Yes    Alcohol/week: 0.0 standard drinks    Comment: occasionally   Drug use: No    Comment: past     Colonoscopy:  PAP:  Bone density:  Lipid panel:  Allergies  Allergen Reactions   Amoxicillin Anaphylaxis    REACTION: unspecified   Penicillin G Anaphylaxis   Gabapentin Other (See Comments)    REACTION: anaphylaxis   Penicillins     REACTION: unspecified    Current Outpatient Medications  Medication Sig Dispense Refill   albuterol (VENTOLIN HFA) 108 (90 Base) MCG/ACT inhaler Inhale 2 puffs into the lungs every 4 (four) hours as needed for wheezing or shortness of breath. 8.5 g 3   alfuzosin (UROXATRAL) 10 MG 24 hr tablet TAKE 1 TABLET BY MOUTH DAILY 90 tablet 3   alfuzosin (UROXATRAL) 10 MG 24 hr tablet Take 1 tablet (10 mg total) by mouth daily with breakfast. 90 tablet 1   ALPRAZolam (XANAX) 0.5 MG tablet Take 1 tablet (0.5 mg total) by mouth 3 (three) times daily as needed. for anxiety 90 tablet 0   amLODipine (NORVASC) 5 MG tablet Take 10 mg by mouth daily.     amphetamine-dextroamphetamine (ADDERALL) 20 MG tablet Take 1 tablet (20 mg total) by mouth 3 (three) times daily. 90 tablet 0   amphetamine-dextroamphetamine (ADDERALL) 20 MG tablet Take 1 tablet (20 mg total) by mouth 3 (three) times daily. 90 tablet 0   [START ON 12/03/2020] amphetamine-dextroamphetamine (ADDERALL) 20 MG tablet Take 1 tablet (20 mg total) by mouth 3 (three) times daily. 90 tablet 0   atorvastatin (LIPITOR) 10 MG tablet Take 1 tablet (10 mg total) by mouth daily. 90 tablet 1   buPROPion (WELLBUTRIN XL) 300 MG 24 hr tablet TAKE 1 TABLET BY MOUTH DAILY, GENERIC EQUIVALENT FOR WELLBUTRIN XL. PATIENT DUE FOR OFFICE VISIT. 90 tablet 1   citalopram (CELEXA) 20 MG tablet Take 1 tablet (20 mg total) by mouth daily. 90 tablet 1   esomeprazole (NEXIUM) 40 MG capsule TK ONE C PO  BID UTD  0    hydrochlorothiazide (HYDRODIURIL) 12.5 MG tablet Take by mouth.     l-methylfolate-B6-B12 (METANX) 3-35-2 MG TABS Take 1 tablet by mouth 2 (two) times daily.     levothyroxine (SYNTHROID) 150 MCG tablet Take 1 tablet (150 mcg total) by mouth daily before breakfast. 90 tablet 1   meloxicam (MOBIC) 7.5 MG tablet Take 1 tablet (7.5 mg total) by mouth 2 (two) times daily. 180 tablet 1   methocarbamol (ROBAXIN) 750 MG tablet Take 750 mg by mouth 4 (four) times daily as needed.     mometasone-formoterol (DULERA) 100-5 MCG/ACT AERO INHALE 2 PUFFS BY MOUTH INTO THE LUNGS EVERY 12 HOURS 39 g  3   MOVANTIK 25 MG TABS tablet Take 25 mg by mouth daily.     Multiple Vitamin (MULTI-VITAMINS) TABS Take by mouth.     NUCYNTA ER 200 MG TB12 200 mg bid  0   oxyCODONE-acetaminophen (PERCOCET) 7.5-325 MG per tablet Take 1 tablet by mouth every 4 (four) hours as needed for pain.      pregabalin (LYRICA) 100 MG capsule Take 100 mg by mouth every 8 (eight) hours.     pregabalin (LYRICA) 50 MG capsule Take 100 mg by mouth 3 (three) times daily.      SYRINGE-NEEDLE, DISP, 3 ML 22G X 1" 3 ML MISC Use as directed.     testosterone cypionate (DEPOTESTOSTERONE CYPIONATE) 200 MG/ML injection INJECT 0.75 MLS INTO THE MUSCLE EVERY 7 DAYS AS DIRECTED 10 mL 2   tiZANidine (ZANAFLEX) 4 MG tablet Take 4 mg by mouth every 6 (six) hours as needed for muscle spasms. Pt takes one tablet at night     valsartan (DIOVAN) 160 MG tablet Take 160 mg by mouth daily.     Vitamin D, Ergocalciferol, (DRISDOL) 1.25 MG (50000 UNIT) CAPS capsule TAKE 1 CAPSULE BY MOUTH EVERY 7 DAYS 12 capsule 1   No current facility-administered medications for this visit.    OBJECTIVE: Vitals:   11/10/20 1310  BP: (!) 141/95  Pulse: 84  Resp: 18  Temp: 98.2 F (36.8 C)  SpO2: 97%     Body mass index is 28.45 kg/m.    ECOG FS:0 - Asymptomatic  Physical Exam Constitutional:      Appearance: Normal appearance.  HENT:     Head: Normocephalic and  atraumatic.  Eyes:     Pupils: Pupils are equal, round, and reactive to light.  Cardiovascular:     Rate and Rhythm: Normal rate and regular rhythm.     Heart sounds: Normal heart sounds. No murmur heard. Pulmonary:     Effort: Pulmonary effort is normal.     Breath sounds: Normal breath sounds. No wheezing.  Abdominal:     General: Bowel sounds are normal. There is no distension.     Palpations: Abdomen is soft.     Tenderness: There is no abdominal tenderness.  Musculoskeletal:        General: Normal range of motion.     Cervical back: Normal range of motion.  Skin:    General: Skin is warm and dry.     Findings: No rash.  Neurological:     Mental Status: He is alert and oriented to person, place, and time.  Psychiatric:        Judgment: Judgment normal.    LAB RESULTS:  Lab Results  Component Value Date   NA 143 06/23/2020   K 3.7 06/23/2020   CL 103 06/23/2020   CO2 24 06/23/2020   GLUCOSE 92 06/23/2020   BUN 7 06/23/2020   CREATININE 1.26 06/23/2020   CALCIUM 8.8 06/23/2020   PROT 6.5 06/23/2020   ALBUMIN 4.4 06/23/2020   AST 27 06/23/2020   ALT 22 06/23/2020   ALKPHOS 81 06/23/2020   BILITOT 0.2 06/23/2020   GFRNONAA >60 05/05/2019   GFRAA >60 05/05/2019    Lab Results  Component Value Date   WBC 6.4 11/07/2020   NEUTROABS 4.0 11/07/2020   HGB 11.0 (L) 11/07/2020   HCT 35.0 (L) 11/07/2020   MCV 81.6 11/07/2020   PLT 246 11/07/2020   Lab Results  Component Value Date   IRON 28 (L) 11/07/2020  TIBC 442 11/07/2020   IRONPCTSAT 6 (L) 11/07/2020   Lab Results  Component Value Date   FERRITIN 6 (L) 11/07/2020     STUDIES: No results found.  ASSESSMENT: Iron deficiency anemia.  PLAN:    1.  Iron deficiency anemia:  Work-up in the past revealed a normal colonoscopy and EGD.  He was supposed to have a capsule study but due to COVID this was postponed.  Patient reports occasional bloody diarrhea with associated abdominal pain.  This happens  generally occurs once a month.  Labs from today show hemoglobin of 11, ferritin of 6 and saturation of 6%.  Recommend 5 doses of IV Venofer.  2.  Lumbar radiculopathy: He is scheduled to have L4/L5 decompression with Dr. Lacinda Axon ([facility]) on 12/04/2020.  3.  Bloody diarrhea: Previously negative EGD and colonoscopy he never had a capsule study secondary to [place].  Recommended reestablishing with GI.  Disposition-proceed with IV Venofer today and 4 additional doses.  RTC in 3 months with repeat labs (CBC, ferritin and iron panel), assessment and possible IV Venofer  I spent 25 minutes dedicated to the care of this patient (face-to-face and non-face-to-face) on the date of the encounter to include what is described in the assessment and plan.  Patient expressed understanding and was in agreement with this plan. He also understands that He can call clinic at any time with any questions, concerns, or complaints.    Jacquelin Hawking, NP   11/10/2020 1:31 PM

## 2020-11-11 ENCOUNTER — Encounter: Payer: Self-pay | Admitting: Oncology

## 2020-11-17 ENCOUNTER — Inpatient Hospital Stay: Payer: BC Managed Care – PPO

## 2020-11-17 VITALS — BP 131/74 | HR 79 | Temp 97.2°F | Resp 18

## 2020-11-17 DIAGNOSIS — K219 Gastro-esophageal reflux disease without esophagitis: Secondary | ICD-10-CM | POA: Diagnosis not present

## 2020-11-17 DIAGNOSIS — D509 Iron deficiency anemia, unspecified: Secondary | ICD-10-CM | POA: Diagnosis not present

## 2020-11-17 DIAGNOSIS — G5600 Carpal tunnel syndrome, unspecified upper limb: Secondary | ICD-10-CM | POA: Diagnosis not present

## 2020-11-17 DIAGNOSIS — R5383 Other fatigue: Secondary | ICD-10-CM | POA: Diagnosis not present

## 2020-11-17 DIAGNOSIS — M5416 Radiculopathy, lumbar region: Secondary | ICD-10-CM | POA: Diagnosis not present

## 2020-11-17 DIAGNOSIS — I10 Essential (primary) hypertension: Secondary | ICD-10-CM | POA: Diagnosis not present

## 2020-11-17 DIAGNOSIS — M2550 Pain in unspecified joint: Secondary | ICD-10-CM | POA: Diagnosis not present

## 2020-11-17 DIAGNOSIS — Z79899 Other long term (current) drug therapy: Secondary | ICD-10-CM | POA: Diagnosis not present

## 2020-11-17 DIAGNOSIS — D508 Other iron deficiency anemias: Secondary | ICD-10-CM

## 2020-11-17 DIAGNOSIS — M48061 Spinal stenosis, lumbar region without neurogenic claudication: Secondary | ICD-10-CM | POA: Diagnosis not present

## 2020-11-17 DIAGNOSIS — E039 Hypothyroidism, unspecified: Secondary | ICD-10-CM | POA: Diagnosis not present

## 2020-11-17 DIAGNOSIS — F419 Anxiety disorder, unspecified: Secondary | ICD-10-CM | POA: Diagnosis not present

## 2020-11-17 DIAGNOSIS — M791 Myalgia, unspecified site: Secondary | ICD-10-CM | POA: Diagnosis not present

## 2020-11-17 DIAGNOSIS — J449 Chronic obstructive pulmonary disease, unspecified: Secondary | ICD-10-CM | POA: Diagnosis not present

## 2020-11-17 DIAGNOSIS — J45909 Unspecified asthma, uncomplicated: Secondary | ICD-10-CM | POA: Diagnosis not present

## 2020-11-17 DIAGNOSIS — M549 Dorsalgia, unspecified: Secondary | ICD-10-CM | POA: Diagnosis not present

## 2020-11-17 DIAGNOSIS — K921 Melena: Secondary | ICD-10-CM | POA: Diagnosis not present

## 2020-11-17 MED ORDER — SODIUM CHLORIDE 0.9 % IV SOLN
Freq: Once | INTRAVENOUS | Status: AC
Start: 2020-11-17 — End: 2020-11-17
  Filled 2020-11-17: qty 250

## 2020-11-17 MED ORDER — IRON SUCROSE 20 MG/ML IV SOLN
200.0000 mg | Freq: Once | INTRAVENOUS | Status: AC
  Administered 2020-11-17: 200 mg via INTRAVENOUS
  Filled 2020-11-17: qty 10

## 2020-11-17 MED ORDER — SODIUM CHLORIDE 0.9 % IV SOLN: 200.0000 mg | Freq: Once | INTRAVENOUS | Status: DC

## 2020-11-17 NOTE — Patient Instructions (Signed)

## 2020-11-18 ENCOUNTER — Other Ambulatory Visit: Payer: Self-pay | Admitting: Internal Medicine

## 2020-11-18 DIAGNOSIS — E039 Hypothyroidism, unspecified: Secondary | ICD-10-CM

## 2020-11-20 DIAGNOSIS — M48061 Spinal stenosis, lumbar region without neurogenic claudication: Secondary | ICD-10-CM | POA: Insufficient documentation

## 2020-11-21 DIAGNOSIS — E039 Hypothyroidism, unspecified: Secondary | ICD-10-CM | POA: Diagnosis not present

## 2020-11-21 DIAGNOSIS — E063 Autoimmune thyroiditis: Secondary | ICD-10-CM | POA: Diagnosis not present

## 2020-11-21 DIAGNOSIS — D509 Iron deficiency anemia, unspecified: Secondary | ICD-10-CM | POA: Diagnosis not present

## 2020-11-21 DIAGNOSIS — E782 Mixed hyperlipidemia: Secondary | ICD-10-CM | POA: Diagnosis not present

## 2020-11-21 DIAGNOSIS — I252 Old myocardial infarction: Secondary | ICD-10-CM | POA: Diagnosis not present

## 2020-11-21 DIAGNOSIS — M79604 Pain in right leg: Secondary | ICD-10-CM | POA: Diagnosis not present

## 2020-11-21 DIAGNOSIS — Z01818 Encounter for other preprocedural examination: Secondary | ICD-10-CM | POA: Diagnosis not present

## 2020-11-21 DIAGNOSIS — I10 Essential (primary) hypertension: Secondary | ICD-10-CM | POA: Diagnosis not present

## 2020-11-21 DIAGNOSIS — N183 Chronic kidney disease, stage 3 unspecified: Secondary | ICD-10-CM | POA: Insufficient documentation

## 2020-11-21 DIAGNOSIS — I6523 Occlusion and stenosis of bilateral carotid arteries: Secondary | ICD-10-CM | POA: Diagnosis not present

## 2020-11-21 DIAGNOSIS — N138 Other obstructive and reflux uropathy: Secondary | ICD-10-CM | POA: Diagnosis not present

## 2020-11-21 DIAGNOSIS — R7303 Prediabetes: Secondary | ICD-10-CM | POA: Diagnosis not present

## 2020-11-21 DIAGNOSIS — N401 Enlarged prostate with lower urinary tract symptoms: Secondary | ICD-10-CM | POA: Diagnosis not present

## 2020-11-21 DIAGNOSIS — F411 Generalized anxiety disorder: Secondary | ICD-10-CM | POA: Diagnosis not present

## 2020-11-21 DIAGNOSIS — M48061 Spinal stenosis, lumbar region without neurogenic claudication: Secondary | ICD-10-CM | POA: Diagnosis not present

## 2020-11-21 DIAGNOSIS — R002 Palpitations: Secondary | ICD-10-CM | POA: Diagnosis not present

## 2020-11-21 DIAGNOSIS — G8929 Other chronic pain: Secondary | ICD-10-CM | POA: Diagnosis not present

## 2020-11-24 ENCOUNTER — Other Ambulatory Visit: Payer: Self-pay

## 2020-11-24 ENCOUNTER — Inpatient Hospital Stay: Payer: BC Managed Care – PPO | Attending: Oncology

## 2020-11-24 VITALS — BP 124/60 | HR 79 | Temp 97.0°F | Resp 19

## 2020-11-24 DIAGNOSIS — M791 Myalgia, unspecified site: Secondary | ICD-10-CM | POA: Insufficient documentation

## 2020-11-24 DIAGNOSIS — J449 Chronic obstructive pulmonary disease, unspecified: Secondary | ICD-10-CM | POA: Insufficient documentation

## 2020-11-24 DIAGNOSIS — G5600 Carpal tunnel syndrome, unspecified upper limb: Secondary | ICD-10-CM | POA: Insufficient documentation

## 2020-11-24 DIAGNOSIS — K921 Melena: Secondary | ICD-10-CM | POA: Insufficient documentation

## 2020-11-24 DIAGNOSIS — K219 Gastro-esophageal reflux disease without esophagitis: Secondary | ICD-10-CM | POA: Diagnosis not present

## 2020-11-24 DIAGNOSIS — Z79899 Other long term (current) drug therapy: Secondary | ICD-10-CM | POA: Diagnosis not present

## 2020-11-24 DIAGNOSIS — E039 Hypothyroidism, unspecified: Secondary | ICD-10-CM | POA: Insufficient documentation

## 2020-11-24 DIAGNOSIS — M549 Dorsalgia, unspecified: Secondary | ICD-10-CM | POA: Diagnosis not present

## 2020-11-24 DIAGNOSIS — R197 Diarrhea, unspecified: Secondary | ICD-10-CM | POA: Diagnosis not present

## 2020-11-24 DIAGNOSIS — M2550 Pain in unspecified joint: Secondary | ICD-10-CM | POA: Insufficient documentation

## 2020-11-24 DIAGNOSIS — J45909 Unspecified asthma, uncomplicated: Secondary | ICD-10-CM | POA: Diagnosis not present

## 2020-11-24 DIAGNOSIS — I10 Essential (primary) hypertension: Secondary | ICD-10-CM | POA: Insufficient documentation

## 2020-11-24 DIAGNOSIS — Z87891 Personal history of nicotine dependence: Secondary | ICD-10-CM | POA: Diagnosis not present

## 2020-11-24 DIAGNOSIS — D509 Iron deficiency anemia, unspecified: Secondary | ICD-10-CM | POA: Diagnosis not present

## 2020-11-24 DIAGNOSIS — D508 Other iron deficiency anemias: Secondary | ICD-10-CM

## 2020-11-24 MED ORDER — SODIUM CHLORIDE 0.9 % IV SOLN: 200.0000 mg | Freq: Once | INTRAVENOUS | Status: DC

## 2020-11-24 MED ORDER — SODIUM CHLORIDE 0.9% FLUSH
10.0000 mL | Freq: Once | INTRAVENOUS | Status: AC
  Administered 2020-11-24: 10 mL via INTRAVENOUS
  Filled 2020-11-24: qty 10

## 2020-11-24 MED ORDER — IRON SUCROSE 20 MG/ML IV SOLN
200.0000 mg | Freq: Once | INTRAVENOUS | Status: AC
  Administered 2020-11-24: 200 mg via INTRAVENOUS
  Filled 2020-11-24: qty 10

## 2020-11-24 MED ORDER — SODIUM CHLORIDE 0.9 % IV SOLN
Freq: Once | INTRAVENOUS | Status: AC
  Filled 2020-11-24: qty 250

## 2020-11-24 NOTE — Patient Instructions (Signed)

## 2020-11-27 ENCOUNTER — Other Ambulatory Visit: Payer: Self-pay | Admitting: Adult Health

## 2020-12-01 ENCOUNTER — Inpatient Hospital Stay: Payer: BC Managed Care – PPO

## 2020-12-01 ENCOUNTER — Other Ambulatory Visit: Payer: Self-pay

## 2020-12-01 VITALS — BP 118/76 | HR 71 | Temp 97.0°F | Resp 17

## 2020-12-01 DIAGNOSIS — Z79899 Other long term (current) drug therapy: Secondary | ICD-10-CM | POA: Diagnosis not present

## 2020-12-01 DIAGNOSIS — E039 Hypothyroidism, unspecified: Secondary | ICD-10-CM | POA: Diagnosis not present

## 2020-12-01 DIAGNOSIS — K219 Gastro-esophageal reflux disease without esophagitis: Secondary | ICD-10-CM | POA: Diagnosis not present

## 2020-12-01 DIAGNOSIS — M2550 Pain in unspecified joint: Secondary | ICD-10-CM | POA: Diagnosis not present

## 2020-12-01 DIAGNOSIS — Z87891 Personal history of nicotine dependence: Secondary | ICD-10-CM | POA: Diagnosis not present

## 2020-12-01 DIAGNOSIS — D508 Other iron deficiency anemias: Secondary | ICD-10-CM

## 2020-12-01 DIAGNOSIS — R197 Diarrhea, unspecified: Secondary | ICD-10-CM | POA: Diagnosis not present

## 2020-12-01 DIAGNOSIS — M791 Myalgia, unspecified site: Secondary | ICD-10-CM | POA: Diagnosis not present

## 2020-12-01 DIAGNOSIS — J449 Chronic obstructive pulmonary disease, unspecified: Secondary | ICD-10-CM | POA: Diagnosis not present

## 2020-12-01 DIAGNOSIS — J45909 Unspecified asthma, uncomplicated: Secondary | ICD-10-CM | POA: Diagnosis not present

## 2020-12-01 DIAGNOSIS — G5600 Carpal tunnel syndrome, unspecified upper limb: Secondary | ICD-10-CM | POA: Diagnosis not present

## 2020-12-01 DIAGNOSIS — M549 Dorsalgia, unspecified: Secondary | ICD-10-CM | POA: Diagnosis not present

## 2020-12-01 DIAGNOSIS — D509 Iron deficiency anemia, unspecified: Secondary | ICD-10-CM | POA: Diagnosis not present

## 2020-12-01 DIAGNOSIS — K921 Melena: Secondary | ICD-10-CM | POA: Diagnosis not present

## 2020-12-01 DIAGNOSIS — I10 Essential (primary) hypertension: Secondary | ICD-10-CM | POA: Diagnosis not present

## 2020-12-01 MED ORDER — SODIUM CHLORIDE 0.9 % IV SOLN: 200.0000 mg | Freq: Once | INTRAVENOUS | Status: DC

## 2020-12-01 MED ORDER — SODIUM CHLORIDE 0.9 % IV SOLN
Freq: Once | INTRAVENOUS | Status: AC
  Filled 2020-12-01: qty 250

## 2020-12-01 MED ORDER — IRON SUCROSE 20 MG/ML IV SOLN
200.0000 mg | Freq: Once | INTRAVENOUS | Status: AC
Start: 2020-12-01 — End: 2020-12-01
  Administered 2020-12-01: 200 mg via INTRAVENOUS
  Filled 2020-12-01: qty 10

## 2020-12-01 NOTE — Patient Instructions (Signed)

## 2020-12-02 DIAGNOSIS — E782 Mixed hyperlipidemia: Secondary | ICD-10-CM | POA: Diagnosis not present

## 2020-12-02 DIAGNOSIS — Z01818 Encounter for other preprocedural examination: Secondary | ICD-10-CM | POA: Diagnosis not present

## 2020-12-02 DIAGNOSIS — Z20822 Contact with and (suspected) exposure to covid-19: Secondary | ICD-10-CM | POA: Diagnosis not present

## 2020-12-02 DIAGNOSIS — I10 Essential (primary) hypertension: Secondary | ICD-10-CM | POA: Diagnosis not present

## 2020-12-02 DIAGNOSIS — I252 Old myocardial infarction: Secondary | ICD-10-CM | POA: Diagnosis not present

## 2020-12-02 DIAGNOSIS — R7303 Prediabetes: Secondary | ICD-10-CM | POA: Diagnosis not present

## 2020-12-03 ENCOUNTER — Inpatient Hospital Stay: Payer: BC Managed Care – PPO

## 2020-12-03 ENCOUNTER — Other Ambulatory Visit: Payer: Self-pay

## 2020-12-03 VITALS — BP 143/73 | HR 75 | Temp 97.3°F | Resp 18

## 2020-12-03 DIAGNOSIS — M2550 Pain in unspecified joint: Secondary | ICD-10-CM | POA: Diagnosis not present

## 2020-12-03 DIAGNOSIS — E039 Hypothyroidism, unspecified: Secondary | ICD-10-CM | POA: Diagnosis not present

## 2020-12-03 DIAGNOSIS — M791 Myalgia, unspecified site: Secondary | ICD-10-CM | POA: Diagnosis not present

## 2020-12-03 DIAGNOSIS — J449 Chronic obstructive pulmonary disease, unspecified: Secondary | ICD-10-CM | POA: Diagnosis not present

## 2020-12-03 DIAGNOSIS — G5600 Carpal tunnel syndrome, unspecified upper limb: Secondary | ICD-10-CM | POA: Diagnosis not present

## 2020-12-03 DIAGNOSIS — M549 Dorsalgia, unspecified: Secondary | ICD-10-CM | POA: Diagnosis not present

## 2020-12-03 DIAGNOSIS — K921 Melena: Secondary | ICD-10-CM | POA: Diagnosis not present

## 2020-12-03 DIAGNOSIS — Z87891 Personal history of nicotine dependence: Secondary | ICD-10-CM | POA: Diagnosis not present

## 2020-12-03 DIAGNOSIS — R197 Diarrhea, unspecified: Secondary | ICD-10-CM | POA: Diagnosis not present

## 2020-12-03 DIAGNOSIS — Z79899 Other long term (current) drug therapy: Secondary | ICD-10-CM | POA: Diagnosis not present

## 2020-12-03 DIAGNOSIS — I10 Essential (primary) hypertension: Secondary | ICD-10-CM | POA: Diagnosis not present

## 2020-12-03 DIAGNOSIS — D509 Iron deficiency anemia, unspecified: Secondary | ICD-10-CM | POA: Diagnosis not present

## 2020-12-03 DIAGNOSIS — K219 Gastro-esophageal reflux disease without esophagitis: Secondary | ICD-10-CM | POA: Diagnosis not present

## 2020-12-03 DIAGNOSIS — J45909 Unspecified asthma, uncomplicated: Secondary | ICD-10-CM | POA: Diagnosis not present

## 2020-12-03 DIAGNOSIS — D508 Other iron deficiency anemias: Secondary | ICD-10-CM

## 2020-12-03 MED ORDER — IRON SUCROSE 20 MG/ML IV SOLN
200.0000 mg | Freq: Once | INTRAVENOUS | Status: AC
  Administered 2020-12-03: 200 mg via INTRAVENOUS
  Filled 2020-12-03: qty 10

## 2020-12-03 MED ORDER — SODIUM CHLORIDE 0.9 % IV SOLN: 200.0000 mg | Freq: Once | INTRAVENOUS | Status: DC

## 2020-12-03 NOTE — Patient Instructions (Signed)
[facility] [place]  Discharge Instructions: Thank you for choosing [facility] to provide your oncology and hematology care.  If you have a lab appointment with the Cancer Center, please go directly to the Cancer Center and check in at the registration area.  Wear comfortable clothing and clothing appropriate for easy access to any Portacath or PICC line.   We strive to give you quality time with your provider. You may need to reschedule your appointment if you arrive late (15 or more minutes).  Arriving late affects you and other patients whose appointments are after yours.  Also, if you miss three or more appointments without notifying the office, you may be dismissed from the clinic at the provider's discretion.      For prescription refill requests, have your pharmacy contact our office and allow 72 hours for refills to be completed.    Today you received the following chemotherapy and/or immunotherapy agents VENOFER      To help prevent nausea and vomiting after your treatment, we encourage you to take your nausea medication as directed.  BELOW ARE SYMPTOMS THAT SHOULD BE REPORTED IMMEDIATELY: *FEVER GREATER THAN 100.4 F (38 C) OR HIGHER *CHILLS OR SWEATING *NAUSEA AND VOMITING THAT IS NOT CONTROLLED WITH YOUR NAUSEA MEDICATION *UNUSUAL SHORTNESS OF BREATH *UNUSUAL BRUISING OR BLEEDING *URINARY PROBLEMS (pain or burning when urinating, or frequent urination) *BOWEL PROBLEMS (unusual diarrhea, constipation, pain near the anus) TENDERNESS IN MOUTH AND THROAT WITH OR WITHOUT PRESENCE OF ULCERS (sore throat, sores in mouth, or a toothache) UNUSUAL RASH, SWELLING OR PAIN  UNUSUAL VAGINAL DISCHARGE OR ITCHING   Items with * indicate a potential emergency and should be followed up as soon as possible or go to the Emergency Department if any problems should occur.  Please show the CHEMOTHERAPY ALERT CARD or IMMUNOTHERAPY ALERT CARD at check-in to  the Emergency Department and triage nurse.  Should you have questions after your visit or need to cancel or reschedule your appointment, please contact [facility]  [phone] and follow the prompts.  Office hours are 8:00 a.m. to 4:30 p.m. Monday - Friday. Please note that voicemails left after 4:00 p.m. may not be returned until the following business day.  We are closed weekends and major holidays. You have access to a nurse at all times for urgent questions. Please call the main number to the clinic [phone] and follow the prompts.  For any non-urgent questions, you may also contact your provider using MyChart. We now offer e-Visits for anyone 18 and older to request care online for non-urgent symptoms. For details visit [email or web address].   Also download the MyChart app! Go to the app store, search "MyChart", open the app, select [facility], and log in with your MyChart username and password.  Due to Covid, a mask is required upon entering the hospital/clinic. If you do not have a mask, one will be given to you upon arrival. For doctor visits, patients may have 1 support person aged 18 or older with them. For treatment visits, patients cannot have anyone with them due to current Covid guidelines and our immunocompromised population.   Iron Sucrose Injection What is this medication? IRON SUCROSE (EYE ern SOO krose) treats low levels of iron (iron deficiency anemia) in people with kidney disease. Iron is a mineral that plays an important role in making red blood cells, which carry oxygen from your lungs to the rest of your body. This medicine may   be used for other purposes; ask your health care provider or pharmacist if you have questions. COMMON BRAND NAME(S): Venofer What should I tell my care team before I take this medication? They need to know if you have any of these conditions: Anemia not caused by low iron levels Heart disease High levels  of iron in the blood Kidney disease Liver disease An unusual or allergic reaction to iron, other medications, foods, dyes, or preservatives Pregnant or trying to get pregnant Breast-feeding How should I use this medication? This medication is for infusion into a vein. It is given in a hospital or clinic setting. Talk to your care team about the use of this medication in children. While this medication may be prescribed for children as young as 2 years for selected conditions, precautions do apply. Overdosage: If you think you have taken too much of this medicine contact a poison control center or emergency room at once. NOTE: This medicine is only for you. Do not share this medicine with others. What if I miss a dose? It is important not to miss your dose. Call your care team if you are unable to keep an appointment. What may interact with this medication? Do not take this medication with any of the following: Deferoxamine Dimercaprol Other iron products This medication may also interact with the following: Chloramphenicol Deferasirox This list may not describe all possible interactions. Give your health care provider a list of all the medicines, herbs, non-prescription drugs, or dietary supplements you use. Also tell them if you smoke, drink alcohol, or use illegal drugs. Some items may interact with your medicine. What should I watch for while using this medication? Visit your care team regularly. Tell your care team if your symptoms do not start to get better or if they get worse. You may need blood work done while you are taking this medication. You may need to follow a special diet. Talk to your care team. Foods that contain iron include: whole grains/cereals, dried fruits, beans, or peas, leafy green vegetables, and organ meats (liver, kidney). What side effects may I notice from receiving this medication? Side effects that you should report to your care team as soon as  possible: Allergic reactions-skin rash, itching, hives, swelling of the face, lips, tongue, or throat Low blood pressure-dizziness, feeling faint or lightheaded, blurry vision Shortness of breath Side effects that usually do not require medical attention (report to your care team if they continue or are bothersome): Flushing Headache Joint pain Muscle pain Nausea Pain, redness, or irritation at injection site This list may not describe all possible side effects. Call your doctor for medical advice about side effects. You may report side effects to FDA at 1-800-FDA-1088. Where should I keep my medication? This medication is given in a hospital or clinic and will not be stored at home. NOTE: This sheet is a summary. It may not cover all possible information. If you have questions about this medicine, talk to your doctor, pharmacist, or health care provider.  2022 Elsevier/Gold Standard (2020-05-06 12:52:06)  

## 2020-12-04 DIAGNOSIS — J449 Chronic obstructive pulmonary disease, unspecified: Secondary | ICD-10-CM | POA: Diagnosis not present

## 2020-12-04 DIAGNOSIS — Z880 Allergy status to penicillin: Secondary | ICD-10-CM | POA: Diagnosis not present

## 2020-12-04 DIAGNOSIS — T85193A Other mechanical complication of implanted electronic neurostimulator, generator, initial encounter: Secondary | ICD-10-CM | POA: Diagnosis not present

## 2020-12-04 DIAGNOSIS — Y831 Surgical operation with implant of artificial internal device as the cause of abnormal reaction of the patient, or of later complication, without mention of misadventure at the time of the procedure: Secondary | ICD-10-CM | POA: Diagnosis not present

## 2020-12-04 DIAGNOSIS — I252 Old myocardial infarction: Secondary | ICD-10-CM | POA: Diagnosis not present

## 2020-12-04 DIAGNOSIS — M48061 Spinal stenosis, lumbar region without neurogenic claudication: Secondary | ICD-10-CM | POA: Diagnosis not present

## 2020-12-04 DIAGNOSIS — I10 Essential (primary) hypertension: Secondary | ICD-10-CM | POA: Diagnosis not present

## 2020-12-04 DIAGNOSIS — D510 Vitamin B12 deficiency anemia due to intrinsic factor deficiency: Secondary | ICD-10-CM | POA: Diagnosis not present

## 2020-12-04 DIAGNOSIS — M48062 Spinal stenosis, lumbar region with neurogenic claudication: Secondary | ICD-10-CM | POA: Diagnosis not present

## 2020-12-04 DIAGNOSIS — G894 Chronic pain syndrome: Secondary | ICD-10-CM | POA: Diagnosis not present

## 2020-12-04 DIAGNOSIS — E039 Hypothyroidism, unspecified: Secondary | ICD-10-CM | POA: Diagnosis not present

## 2020-12-04 DIAGNOSIS — Z9689 Presence of other specified functional implants: Secondary | ICD-10-CM | POA: Diagnosis not present

## 2020-12-04 DIAGNOSIS — Z87891 Personal history of nicotine dependence: Secondary | ICD-10-CM | POA: Diagnosis not present

## 2020-12-04 HISTORY — PX: BACK SURGERY: SHX140

## 2020-12-08 ENCOUNTER — Ambulatory Visit: Payer: BC Managed Care – PPO

## 2020-12-12 ENCOUNTER — Other Ambulatory Visit: Payer: Self-pay | Admitting: Nurse Practitioner

## 2020-12-23 DIAGNOSIS — J449 Chronic obstructive pulmonary disease, unspecified: Secondary | ICD-10-CM | POA: Diagnosis not present

## 2020-12-23 DIAGNOSIS — D509 Iron deficiency anemia, unspecified: Secondary | ICD-10-CM | POA: Diagnosis not present

## 2020-12-23 DIAGNOSIS — E039 Hypothyroidism, unspecified: Secondary | ICD-10-CM | POA: Diagnosis not present

## 2020-12-23 DIAGNOSIS — N183 Chronic kidney disease, stage 3 unspecified: Secondary | ICD-10-CM | POA: Diagnosis not present

## 2020-12-23 DIAGNOSIS — I10 Essential (primary) hypertension: Secondary | ICD-10-CM | POA: Diagnosis not present

## 2020-12-23 DIAGNOSIS — I252 Old myocardial infarction: Secondary | ICD-10-CM | POA: Diagnosis not present

## 2020-12-23 DIAGNOSIS — Z01818 Encounter for other preprocedural examination: Secondary | ICD-10-CM | POA: Diagnosis not present

## 2020-12-23 DIAGNOSIS — Z9689 Presence of other specified functional implants: Secondary | ICD-10-CM | POA: Diagnosis not present

## 2021-01-14 ENCOUNTER — Other Ambulatory Visit: Payer: Self-pay | Admitting: Internal Medicine

## 2021-01-14 DIAGNOSIS — F410 Panic disorder [episodic paroxysmal anxiety] without agoraphobia: Secondary | ICD-10-CM

## 2021-01-14 DIAGNOSIS — M1812 Unilateral primary osteoarthritis of first carpometacarpal joint, left hand: Secondary | ICD-10-CM | POA: Diagnosis not present

## 2021-01-14 DIAGNOSIS — F411 Generalized anxiety disorder: Secondary | ICD-10-CM

## 2021-01-14 DIAGNOSIS — N183 Chronic kidney disease, stage 3 unspecified: Secondary | ICD-10-CM | POA: Diagnosis not present

## 2021-01-14 DIAGNOSIS — I129 Hypertensive chronic kidney disease with stage 1 through stage 4 chronic kidney disease, or unspecified chronic kidney disease: Secondary | ICD-10-CM | POA: Diagnosis not present

## 2021-01-14 HISTORY — PX: WRIST SURGERY: SHX841

## 2021-01-15 NOTE — Telephone Encounter (Signed)
Med sent.

## 2021-01-18 ENCOUNTER — Encounter: Payer: Self-pay | Admitting: Oncology

## 2021-01-27 ENCOUNTER — Inpatient Hospital Stay: Payer: BC Managed Care – PPO | Attending: Oncology

## 2021-01-27 ENCOUNTER — Other Ambulatory Visit: Payer: Self-pay

## 2021-01-27 DIAGNOSIS — D509 Iron deficiency anemia, unspecified: Secondary | ICD-10-CM | POA: Insufficient documentation

## 2021-01-27 LAB — CBC WITH DIFFERENTIAL/PLATELET
Abs Immature Granulocytes: 0.03 10*3/uL (ref 0.00–0.07)
Basophils Absolute: 0.1 10*3/uL (ref 0.0–0.1)
Basophils Relative: 1 %
Eosinophils Absolute: 0.1 10*3/uL (ref 0.0–0.5)
Eosinophils Relative: 2 %
HCT: 41.2 % (ref 39.0–52.0)
Hemoglobin: 13.3 g/dL (ref 13.0–17.0)
Immature Granulocytes: 0 %
Lymphocytes Relative: 23 %
Lymphs Abs: 1.7 10*3/uL (ref 0.7–4.0)
MCH: 27.5 pg (ref 26.0–34.0)
MCHC: 32.3 g/dL (ref 30.0–36.0)
MCV: 85.3 fL (ref 80.0–100.0)
Monocytes Absolute: 0.6 10*3/uL (ref 0.1–1.0)
Monocytes Relative: 8 %
Neutro Abs: 4.8 10*3/uL (ref 1.7–7.7)
Neutrophils Relative %: 66 %
Platelets: 272 10*3/uL (ref 150–400)
RBC: 4.83 MIL/uL (ref 4.22–5.81)
RDW: 16 % — ABNORMAL HIGH (ref 11.5–15.5)
WBC: 7.3 10*3/uL (ref 4.0–10.5)
nRBC: 0 % (ref 0.0–0.2)

## 2021-01-27 LAB — IRON AND TIBC
Iron: 73 ug/dL (ref 45–182)
Saturation Ratios: 23 % (ref 17.9–39.5)
TIBC: 315 ug/dL (ref 250–450)
UIBC: 242 ug/dL

## 2021-01-27 LAB — FERRITIN: Ferritin: 56 ng/mL (ref 24–336)

## 2021-01-29 DIAGNOSIS — Z9889 Other specified postprocedural states: Secondary | ICD-10-CM | POA: Diagnosis not present

## 2021-01-29 DIAGNOSIS — Z4789 Encounter for other orthopedic aftercare: Secondary | ICD-10-CM | POA: Diagnosis not present

## 2021-01-29 DIAGNOSIS — M1812 Unilateral primary osteoarthritis of first carpometacarpal joint, left hand: Secondary | ICD-10-CM | POA: Diagnosis not present

## 2021-01-30 ENCOUNTER — Encounter: Payer: Self-pay | Admitting: Nurse Practitioner

## 2021-01-30 ENCOUNTER — Inpatient Hospital Stay (HOSPITAL_BASED_OUTPATIENT_CLINIC_OR_DEPARTMENT_OTHER): Payer: BC Managed Care – PPO | Admitting: Nurse Practitioner

## 2021-02-02 ENCOUNTER — Telehealth: Payer: Self-pay

## 2021-02-02 NOTE — Telephone Encounter (Signed)
Left vm to confirm 02/03/21 appointment-[name]

## 2021-02-03 ENCOUNTER — Other Ambulatory Visit: Payer: Self-pay

## 2021-02-03 ENCOUNTER — Encounter: Payer: Self-pay | Admitting: Nurse Practitioner

## 2021-02-03 ENCOUNTER — Ambulatory Visit: Payer: BC Managed Care – PPO | Admitting: Nurse Practitioner

## 2021-02-03 VITALS — BP 99/55 | HR 92 | Temp 98.7°F | Resp 16 | Ht 72.0 in | Wt 207.0 lb

## 2021-02-03 DIAGNOSIS — J0110 Acute frontal sinusitis, unspecified: Secondary | ICD-10-CM | POA: Diagnosis not present

## 2021-02-03 DIAGNOSIS — R051 Acute cough: Secondary | ICD-10-CM | POA: Diagnosis not present

## 2021-02-03 DIAGNOSIS — G47429 Narcolepsy in conditions classified elsewhere without cataplexy: Secondary | ICD-10-CM

## 2021-02-03 DIAGNOSIS — F410 Panic disorder [episodic paroxysmal anxiety] without agoraphobia: Secondary | ICD-10-CM

## 2021-02-03 DIAGNOSIS — F411 Generalized anxiety disorder: Secondary | ICD-10-CM

## 2021-02-03 MED ORDER — AMPHETAMINE-DEXTROAMPHETAMINE 20 MG PO TABS: 20.0000 mg | ORAL_TABLET | Freq: Three times a day (TID) | ORAL | 0 refills | Status: DC

## 2021-02-03 MED ORDER — ALPRAZOLAM 0.5 MG PO TABS: 0.5000 mg | ORAL_TABLET | Freq: Three times a day (TID) | ORAL | 2 refills | Status: DC | PRN

## 2021-02-03 MED ORDER — BENZONATATE 100 MG PO CAPS: 100.0000 mg | ORAL_CAPSULE | Freq: Two times a day (BID) | ORAL | 0 refills | Status: DC | PRN

## 2021-02-03 MED ORDER — DOXYCYCLINE HYCLATE 100 MG PO TABS: 100.0000 mg | ORAL_TABLET | Freq: Two times a day (BID) | ORAL | 0 refills | Status: DC

## 2021-02-03 NOTE — Progress Notes (Signed)
[facility] [place], [address] [address]  Internal MEDICINE  Office Visit Note  Patient Name: Randy Mclean  [id number]  [id number]  Date of Service: 02/03/2021  Chief Complaint  Patient presents with   Follow-up    When pt lays down pt will cough up phlegm, neg covid test 5 days ago   Medication Refill    HPI Randy Mclean presents for a follow up visit for medication refills of adderall and alprazolam. He is also experiencing symptoms of a sinus infection that are persisting despite OTC medications for symptomrelief with dayquil and nyquil. He had a negative home covid test. He has a cough with sputum production, runny nose, nasal congestion, and post nasal drip.     Current Medication: Outpatient Encounter Medications as of 02/03/2021  Medication Sig   albuterol (VENTOLIN HFA) 108 (90 Base) MCG/ACT inhaler Inhale 2 puffs into the lungs every 4 (four) hours as needed for wheezing or shortness of breath.   alfuzosin (UROXATRAL) 10 MG 24 hr tablet TAKE 1 TABLET BY MOUTH DAILY   alfuzosin (UROXATRAL) 10 MG 24 hr tablet Take 1 tablet (10 mg total) by mouth daily with breakfast.   amLODipine (NORVASC) 5 MG tablet Take 10 mg by mouth daily.   amphetamine-dextroamphetamine (ADDERALL) 20 MG tablet Take 1 tablet (20 mg total) by mouth 3 (three) times daily.   amphetamine-dextroamphetamine (ADDERALL) 20 MG tablet Take 1 tablet (20 mg total) by mouth 3 (three) times daily.   benzonatate (TESSALON) 100 MG capsule Take 1 capsule (100 mg total) by mouth 2 (two) times daily as needed for cough.   citalopram (CELEXA) 20 MG tablet Take 1 tablet (20 mg total) by mouth daily.   doxycycline (VIBRA-TABS) 100 MG tablet Take 1 tablet (100 mg total) by mouth 2 (two) times daily. With food   esomeprazole (NEXIUM) 40 MG capsule TK ONE C PO  BID UTD   l-methylfolate-B6-B12 (METANX) 3-35-2 MG TABS Take 1 tablet by mouth 2 (two) times daily.   levothyroxine (SYNTHROID) 150 MCG  tablet TAKE 1 TABLET BY MOUTH DAILY BEFORE BREAKFAST   meloxicam (MOBIC) 7.5 MG tablet Take 1 tablet (7.5 mg total) by mouth 2 (two) times daily.   methocarbamol (ROBAXIN) 750 MG tablet Take 750 mg by mouth 4 (four) times daily as needed.   mometasone-formoterol (DULERA) 100-5 MCG/ACT AERO INHALE 2 PUFFS BY MOUTH INTO THE LUNGS EVERY 12 HOURS   MOVANTIK 25 MG TABS tablet Take 25 mg by mouth daily.   Multiple Vitamin (MULTI-VITAMINS) TABS Take by mouth.   NUCYNTA ER 200 MG TB12 200 mg bid   oxyCODONE-acetaminophen (PERCOCET) 7.5-325 MG per tablet Take 1 tablet by mouth every 4 (four) hours as needed for pain.    pregabalin (LYRICA) 100 MG capsule Take 100 mg by mouth every 8 (eight) hours.   pregabalin (LYRICA) 50 MG capsule Take 100 mg by mouth 3 (three) times daily.    SYRINGE-NEEDLE, DISP, 3 ML 22G X 1" 3 ML MISC Use as directed.   testosterone cypionate (DEPOTESTOSTERONE CYPIONATE) 200 MG/ML injection INJECT 0.75 MLS INTO THE MUSCLE EVERY 7 DAYS AS DIRECTED   tiZANidine (ZANAFLEX) 4 MG tablet Take 4 mg by mouth every 6 (six) hours as needed for muscle spasms. Pt takes one tablet at night   valsartan (DIOVAN) 160 MG tablet Take 160 mg by mouth daily.   [DISCONTINUED] ALPRAZolam (XANAX) 0.5 MG tablet TAKE 1 TABLET(0.5 MG) BY MOUTH THREE TIMES DAILY AS NEEDED FOR ANXIETY   [  DISCONTINUED] amphetamine-dextroamphetamine (ADDERALL) 20 MG tablet Take 1 tablet (20 mg total) by mouth 3 (three) times daily.   [DISCONTINUED] atorvastatin (LIPITOR) 10 MG tablet Take 1 tablet (10 mg total) by mouth daily.   [DISCONTINUED] buPROPion (WELLBUTRIN XL) 300 MG 24 hr tablet TAKE 1 TABLET BY MOUTH DAILY, GENERIC EQUIVALENT FOR WELLBUTRIN XL. PATIENT DUE FOR OFFICE VISIT.   ALPRAZolam (XANAX) 0.5 MG tablet Take 1 tablet (0.5 mg total) by mouth 3 (three) times daily as needed for anxiety.   amphetamine-dextroamphetamine (ADDERALL) 20 MG tablet Take 1 tablet (20 mg total) by mouth 3 (three) times daily.    amphetamine-dextroamphetamine (ADDERALL) 20 MG tablet Take 1 tablet (20 mg total) by mouth 3 (three) times daily.   [START ON 03/31/2021] amphetamine-dextroamphetamine (ADDERALL) 20 MG tablet Take 1 tablet (20 mg total) by mouth 3 (three) times daily.   hydrochlorothiazide (HYDRODIURIL) 12.5 MG tablet Take by mouth.   Vitamin D, Ergocalciferol, (DRISDOL) 1.25 MG (50000 UNIT) CAPS capsule TAKE 1 CAPSULE BY MOUTH EVERY 7 DAYS   No facility-administered encounter medications on file as of 02/03/2021.    Surgical History: Past Surgical History:  Procedure Laterality Date   ANTERIOR CERVICAL DISCECTOMY     APPENDECTOMY     BACK SURGERY     BACK SURGERY  12/04/2020   COLONOSCOPY  09/2013   elbow surgery     HEMORRHOID SURGERY     LAPAROSCOPIC APPENDECTOMY N/A 12/04/2017   Procedure: APPENDECTOMY LAPAROSCOPIC;  Surgeon: Vickie Epley, MD;  Location: [facility] ORS;  Service: General;  Laterality: N/A;   LIPOMA EXCISION Right    leg   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     SPINAL CORD STIMULATOR IMPLANT     UPPER GI ENDOSCOPY  09/2013   WRIST SURGERY Left 01/14/2021    Medical History: Past Medical History:  Diagnosis Date   Acute appendicitis 12/04/2017   Allergy    takes allergy shots   Anemia    Arthritis    Asthma    Barrett esophagus    Bronchitis    Chronic pain    Colon polyp    Depression    GERD (gastroesophageal reflux disease)    Hemorrhoid    Hyperlipidemia    Hypertension    Hypothyroid    IBS (irritable bowel syndrome)    Kidney stone    MI (myocardial infarction) ([place])    between 2002 and 2006   Migraine    Pneumonia    Seizures ([place])    Sinus problem    Spinal cord stimulator status     Family History: Family History  Problem Relation Age of Onset   Hypertension Mother    Lung disease Mother    Heart attack Father    Diabetes Father     Social History   Socioeconomic History   Marital status: Married    Spouse name: Not on file    Number of children: Not on file   Years of education: Not on file   Highest education level: Not on file  Occupational History   Not on file  Tobacco Use   Smoking status: Former    Packs/day: 1.00    Years: 30.00    Pack years: 30.00    Types: Cigarettes    Quit date: 02/22/2005    Years since quitting: 16.0   Smokeless tobacco: Never  Vaping Use   Vaping Use: Never used  Substance and Sexual Activity   Alcohol use: Yes    Alcohol/week:  0.0 standard drinks    Comment: occasionally   Drug use: No    Comment: past   Sexual activity: Yes  Other Topics Concern   Not on file  Social History Narrative   Not on file   Social Determinants of Health   Financial Resource Strain: Not on file  Food Insecurity: Not on file  Transportation Needs: Not on file  Physical Activity: Not on file  Stress: Not on file  Social Connections: Not on file  Intimate Partner Violence: Not on file      Review of Systems  Constitutional:  Negative for diaphoresis and fever.  HENT:  Positive for congestion, postnasal drip, rhinorrhea, sinus pressure, sinus pain and sore throat. Negative for ear pain.   Respiratory:  Positive for cough. Negative for chest tightness, shortness of breath and wheezing.   Cardiovascular:  Negative for chest pain and palpitations.  Gastrointestinal:  Positive for nausea. Negative for abdominal pain, constipation, diarrhea and vomiting.  Musculoskeletal: Negative.  Negative for myalgias.  Skin: Negative.  Negative for rash.  Neurological:  Positive for headaches. Negative for dizziness and light-headedness.   Vital Signs: BP (!) 99/55    Pulse 92    Temp 98.7 F (37.1 C)    Resp 16    Ht 6' (1.829 m)    Wt 207 lb (93.9 kg)    SpO2 97%    BMI 28.07 kg/m    Physical Exam Vitals reviewed.  Constitutional:      Appearance: Normal appearance. He is normal weight.  HENT:     Head: Normocephalic and atraumatic.     Right Ear: Tympanic membrane, ear canal and external  ear normal.     Left Ear: Tympanic membrane, ear canal and external ear normal.     Nose: Congestion and rhinorrhea present.     Mouth/Throat:     Mouth: Mucous membranes are moist.     Pharynx: Posterior oropharyngeal erythema present. No oropharyngeal exudate.  Eyes:     Pupils: Pupils are equal, round, and reactive to light.  Cardiovascular:     Rate and Rhythm: Normal rate and regular rhythm.     Pulses: Normal pulses.     Heart sounds: Normal heart sounds. No murmur heard. Pulmonary:     Effort: Pulmonary effort is normal. No respiratory distress.     Breath sounds: Normal breath sounds. No wheezing.  Lymphadenopathy:     Cervical: No cervical adenopathy.  Neurological:     Mental Status: He is alert and oriented to person, place, and time.  Psychiatric:        Mood and Affect: Mood normal.        Behavior: Behavior normal.       Assessment/Plan: 1. Acute non-recurrent frontal sinusitis Empiric antibiotic treatment prescribed. Call the clinic if no improvement after 3 days or more.  - doxycycline (VIBRA-TABS) 100 MG tablet; Take 1 tablet (100 mg total) by mouth 2 (two) times daily. With food  Dispense: 20 tablet; Refill: 0  2. Acute cough Medication prescribed for symptomatic relief of cough - benzonatate (TESSALON) 100 MG capsule; Take 1 capsule (100 mg total) by mouth 2 (two) times daily as needed for cough.  Dispense: 30 capsule; Refill: 0  3. Narcolepsy due to underlying condition without cataplexy Refills x 3 months ordered, follow up in 3 months. Stable on current dose, UDS at next office visit.  - amphetamine-dextroamphetamine (ADDERALL) 20 MG tablet; Take 1 tablet (20 mg total) by mouth 3 (three)  times daily.  Dispense: 90 tablet; Refill: 0 - amphetamine-dextroamphetamine (ADDERALL) 20 MG tablet; Take 1 tablet (20 mg total) by mouth 3 (three) times daily.  Dispense: 90 tablet; Refill: 0 - amphetamine-dextroamphetamine (ADDERALL) 20 MG tablet; Take 1 tablet (20 mg  total) by mouth 3 (three) times daily.  Dispense: 90 tablet; Refill: 0  4. Generalized anxiety disorder with panic attacks Refills x3 months ordered, follow up in 3 months.  - ALPRAZolam (XANAX) 0.5 MG tablet; Take 1 tablet (0.5 mg total) by mouth 3 (three) times daily as needed for anxiety.  Dispense: 90 tablet; Refill: 2   General Counseling: Randy Mclean verbalizes understanding of the findings of todays visit and agrees with plan of treatment. I have discussed any further diagnostic evaluation that may be needed or ordered today. We also reviewed his medications today. he has been encouraged to call the office with any questions or concerns that should arise related to todays visit.    No orders of the defined types were placed in this encounter.   Meds ordered this encounter  Medications   amphetamine-dextroamphetamine (ADDERALL) 20 MG tablet    Sig: Take 1 tablet (20 mg total) by mouth 3 (three) times daily.    Dispense:  90 tablet    Refill:  0    Fill for december   ALPRAZolam (XANAX) 0.5 MG tablet    Sig: Take 1 tablet (0.5 mg total) by mouth 3 (three) times daily as needed for anxiety.    Dispense:  90 tablet    Refill:  2   amphetamine-dextroamphetamine (ADDERALL) 20 MG tablet    Sig: Take 1 tablet (20 mg total) by mouth 3 (three) times daily.    Dispense:  90 tablet    Refill:  0    Fill for january   amphetamine-dextroamphetamine (ADDERALL) 20 MG tablet    Sig: Take 1 tablet (20 mg total) by mouth 3 (three) times daily.    Dispense:  90 tablet    Refill:  0    Fill for february   doxycycline (VIBRA-TABS) 100 MG tablet    Sig: Take 1 tablet (100 mg total) by mouth 2 (two) times daily. With food    Dispense:  20 tablet    Refill:  0   benzonatate (TESSALON) 100 MG capsule    Sig: Take 1 capsule (100 mg total) by mouth 2 (two) times daily as needed for cough.    Dispense:  30 capsule    Refill:  0    Return in about 3 months (around 05/04/2021) for F/U, med refill,  Narcisa Ganesh PCP.   Total time spent:30 Minutes Time spent includes review of chart, medications, test results, and follow up plan with the patient.   [place] Controlled Substance Database was reviewed by me.  This patient was seen by Jonetta Osgood, FNP-C in collaboration with Dr. Clayborn Bigness as a part of collaborative care agreement.   [place] R. Valetta Fuller, MSN, FNP-C Internal medicine

## 2021-02-09 ENCOUNTER — Ambulatory Visit: Payer: BC Managed Care – PPO | Admitting: Oncology

## 2021-02-09 ENCOUNTER — Other Ambulatory Visit: Payer: BC Managed Care – PPO

## 2021-02-09 ENCOUNTER — Ambulatory Visit: Payer: BC Managed Care – PPO

## 2021-02-10 ENCOUNTER — Inpatient Hospital Stay (HOSPITAL_BASED_OUTPATIENT_CLINIC_OR_DEPARTMENT_OTHER): Payer: BC Managed Care – PPO | Admitting: Nurse Practitioner

## 2021-02-10 DIAGNOSIS — D500 Iron deficiency anemia secondary to blood loss (chronic): Secondary | ICD-10-CM

## 2021-02-10 NOTE — Progress Notes (Signed)
[place]  Telephone:([phone] [phone]  Virtual Visit Progress Note  I connected with Randy Mclean on 02/10/21 at  2:30 PM EST by video enabled telemedicine visit and verified that I am speaking with the correct person using two identifiers.   I discussed the limitations, risks, security and privacy concerns of performing an evaluation and management service by telemedicine and the availability of in-person appointments. I also discussed with the patient that there may be a patient responsible charge related to this service. The patient expressed understanding and agreed to proceed.   Other persons participating in the visit and their role in the encounter: none   Patients location: home  Providers location: clinic   Chief Complaint: IDA f/u  ID: Randy Mclean OB: October 12, 1960  MR#: [id number]  YKZ#:[id number]  Patient Care Team: Jonetta Osgood, NP as PCP - General (Nurse Practitioner) Robert Bellow, MD (General Surgery) Lavera Guise, MD (Internal Medicine) Lloyd Huger, MD as Consulting Physician (Oncology)  CHIEF COMPLAINT: Iron deficiency anemia.  HPI:  [name] is a 60 year old male with past medical history significant for hypertension, COPD, GERD, asthma, hypothyroidism, back pain with radiculopathy, carpal tunnel syndrome, anxiety who is followed by Dr. Grayland Ormond for iron deficiency anemia.  He receives intermittent IV Venofer last on 07/25/2020.  Interval history-patient is a 60 year old male who agrees to telemedicine visit for discussion of lab work and follow-up for history of iron deficiency anemia.  She continues to report fatigue.  He has had 2 surgeries over the last few months.  Denies recent bloody diarrhea.  Otherwise denies specific complaints.Denies any neurologic complaints. Denies recent fevers or illnesses. Denies any easy bleeding or bruising. No pica or restless leg. Reports good appetite and  denies weight loss. Denies chest pain. Denies any nausea, vomiting, constipation, or diarrhea. Denies urinary complaints. Patient offers no further specific complaints today.  REVIEW OF SYSTEMS:   Review of Systems  Constitutional:  Positive for malaise/fatigue. Negative for chills, diaphoresis, fever and weight loss.  HENT:  Negative for congestion, ear discharge, ear pain, nosebleeds, sinus pain and sore throat.   Eyes:  Negative for pain and redness.  Respiratory:  Negative for cough, hemoptysis, sputum production, shortness of breath, wheezing and stridor.   Cardiovascular:  Negative for chest pain, palpitations and leg swelling.  Gastrointestinal:  Negative for abdominal pain, constipation, diarrhea, nausea and vomiting.  Musculoskeletal:  Positive for back pain and joint pain. Negative for falls and myalgias.  Skin:  Negative for rash.  Neurological:  Negative for dizziness, loss of consciousness, weakness and headaches.  Psychiatric/Behavioral:  Negative for depression. The patient is not nervous/anxious and does not have insomnia.   All other systems reviewed and are negative.  As per HPI. Otherwise, a complete review of systems is negative.  PAST MEDICAL HISTORY: Past Medical History:  Diagnosis Date   Acute appendicitis 12/04/2017   Allergy    takes allergy shots   Anemia    Arthritis    Asthma    Barrett esophagus    Bronchitis    Chronic pain    Colon polyp    Depression    GERD (gastroesophageal reflux disease)    Hemorrhoid    Hyperlipidemia    Hypertension    Hypothyroid    IBS (irritable bowel syndrome)    Kidney stone    MI (myocardial infarction) ([place])    between 2002 and 2006   Migraine    Pneumonia  Seizures ([place])    Sinus problem    Spinal cord stimulator status     PAST SURGICAL HISTORY: Past Surgical History:  Procedure Laterality Date   ANTERIOR CERVICAL DISCECTOMY     APPENDECTOMY     BACK SURGERY     BACK SURGERY  12/04/2020    COLONOSCOPY  09/2013   elbow surgery     HEMORRHOID SURGERY     LAPAROSCOPIC APPENDECTOMY N/A 12/04/2017   Procedure: APPENDECTOMY LAPAROSCOPIC;  Surgeon: Vickie Epley, MD;  Location: [facility] ORS;  Service: General;  Laterality: N/A;   LIPOMA EXCISION Right    leg   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     SPINAL CORD STIMULATOR IMPLANT     UPPER GI ENDOSCOPY  09/2013   WRIST SURGERY Left 01/14/2021    FAMILY HISTORY Family History  Problem Relation Age of Onset   Hypertension Mother    Lung disease Mother    Heart attack Father    Diabetes Father        ADVANCED DIRECTIVES:    HEALTH MAINTENANCE: Social History   Tobacco Use   Smoking status: Former    Packs/day: 1.00    Years: 30.00    Pack years: 30.00    Types: Cigarettes    Quit date: 02/22/2005    Years since quitting: 15.9   Smokeless tobacco: Never  Vaping Use   Vaping Use: Never used  Substance Use Topics   Alcohol use: Yes    Alcohol/week: 0.0 standard drinks    Comment: occasionally   Drug use: No    Comment: past     Colonoscopy:  PAP:  Bone density:  Lipid panel:  Allergies  Allergen Reactions   Amoxicillin Anaphylaxis    REACTION: unspecified   Penicillin G Anaphylaxis   Gabapentin Other (See Comments)    REACTION: anaphylaxis   Penicillins     REACTION: unspecified    Current Outpatient Medications  Medication Sig Dispense Refill   albuterol (VENTOLIN HFA) 108 (90 Base) MCG/ACT inhaler Inhale 2 puffs into the lungs every 4 (four) hours as needed for wheezing or shortness of breath. 8.5 g 3   alfuzosin (UROXATRAL) 10 MG 24 hr tablet TAKE 1 TABLET BY MOUTH DAILY 90 tablet 3   alfuzosin (UROXATRAL) 10 MG 24 hr tablet Take 1 tablet (10 mg total) by mouth daily with breakfast. 90 tablet 1   ALPRAZolam (XANAX) 0.5 MG tablet Take 1 tablet (0.5 mg total) by mouth 3 (three) times daily as needed for anxiety. 90 tablet 2   amLODipine (NORVASC) 5 MG tablet Take 10 mg by mouth daily.      amphetamine-dextroamphetamine (ADDERALL) 20 MG tablet Take 1 tablet (20 mg total) by mouth 3 (three) times daily. 90 tablet 0   amphetamine-dextroamphetamine (ADDERALL) 20 MG tablet Take 1 tablet (20 mg total) by mouth 3 (three) times daily. 90 tablet 0   amphetamine-dextroamphetamine (ADDERALL) 20 MG tablet Take 1 tablet (20 mg total) by mouth 3 (three) times daily. 90 tablet 0   [START ON 03/03/2021] amphetamine-dextroamphetamine (ADDERALL) 20 MG tablet Take 1 tablet (20 mg total) by mouth 3 (three) times daily. 90 tablet 0   [START ON 03/31/2021] amphetamine-dextroamphetamine (ADDERALL) 20 MG tablet Take 1 tablet (20 mg total) by mouth 3 (three) times daily. 90 tablet 0   atorvastatin (LIPITOR) 10 MG tablet Take 1 tablet (10 mg total) by mouth daily. 90 tablet 1   benzonatate (TESSALON) 100 MG capsule Take 1 capsule (100 mg total)  by mouth 2 (two) times daily as needed for cough. 30 capsule 0   buPROPion (WELLBUTRIN XL) 300 MG 24 hr tablet TAKE 1 TABLET BY MOUTH DAILY, GENERIC EQUIVALENT FOR WELLBUTRIN XL. PATIENT DUE FOR OFFICE VISIT. 90 tablet 1   citalopram (CELEXA) 20 MG tablet Take 1 tablet (20 mg total) by mouth daily. 90 tablet 1   doxycycline (VIBRA-TABS) 100 MG tablet Take 1 tablet (100 mg total) by mouth 2 (two) times daily. With food 20 tablet 0   esomeprazole (NEXIUM) 40 MG capsule TK ONE C PO  BID UTD  0   l-methylfolate-B6-B12 (METANX) 3-35-2 MG TABS Take 1 tablet by mouth 2 (two) times daily.     levothyroxine (SYNTHROID) 150 MCG tablet TAKE 1 TABLET BY MOUTH DAILY BEFORE BREAKFAST 90 tablet 1   meloxicam (MOBIC) 7.5 MG tablet Take 1 tablet (7.5 mg total) by mouth 2 (two) times daily. 180 tablet 1   methocarbamol (ROBAXIN) 750 MG tablet Take 750 mg by mouth 4 (four) times daily as needed.     mometasone-formoterol (DULERA) 100-5 MCG/ACT AERO INHALE 2 PUFFS BY MOUTH INTO THE LUNGS EVERY 12 HOURS 39 g 3   MOVANTIK 25 MG TABS tablet Take 25 mg by mouth daily.     Multiple Vitamin  (MULTI-VITAMINS) TABS Take by mouth.     NUCYNTA ER 200 MG TB12 200 mg bid  0   oxyCODONE-acetaminophen (PERCOCET) 7.5-325 MG per tablet Take 1 tablet by mouth every 4 (four) hours as needed for pain.      pregabalin (LYRICA) 100 MG capsule Take 100 mg by mouth every 8 (eight) hours.     pregabalin (LYRICA) 50 MG capsule Take 100 mg by mouth 3 (three) times daily.      SYRINGE-NEEDLE, DISP, 3 ML 22G X 1" 3 ML MISC Use as directed.     testosterone cypionate (DEPOTESTOSTERONE CYPIONATE) 200 MG/ML injection INJECT 0.75 MLS INTO THE MUSCLE EVERY 7 DAYS AS DIRECTED 10 mL 2   tiZANidine (ZANAFLEX) 4 MG tablet Take 4 mg by mouth every 6 (six) hours as needed for muscle spasms. Pt takes one tablet at night     valsartan (DIOVAN) 160 MG tablet Take 160 mg by mouth daily.     hydrochlorothiazide (HYDRODIURIL) 12.5 MG tablet Take by mouth.     Vitamin D, Ergocalciferol, (DRISDOL) 1.25 MG (50000 UNIT) CAPS capsule TAKE 1 CAPSULE BY MOUTH EVERY 7 DAYS 12 capsule 1   No current facility-administered medications for this visit.    OBJECTIVE: There were no vitals filed for this visit.    There is no height or weight on file to calculate BMI.    ECOG FS:0 - Asymptomatic  Physical Exam Vitals reviewed.  Constitutional:      Appearance: He is not ill-appearing.  Pulmonary:     Effort: No respiratory distress.  Skin:    Coloration: Skin is not pale.  Neurological:     Mental Status: He is alert and oriented to person, place, and time.  Psychiatric:        Mood and Affect: Mood normal.        Behavior: Behavior normal.    LAB RESULTS:  Lab Results  Component Value Date   NA 143 06/23/2020   K 3.7 06/23/2020   CL 103 06/23/2020   CO2 24 06/23/2020   GLUCOSE 92 06/23/2020   BUN 7 06/23/2020   CREATININE 1.26 06/23/2020   CALCIUM 8.8 06/23/2020   PROT 6.5 06/23/2020   ALBUMIN  4.4 06/23/2020   AST 27 06/23/2020   ALT 22 06/23/2020   ALKPHOS 81 06/23/2020   BILITOT 0.2 06/23/2020    GFRNONAA >60 05/05/2019   GFRAA >60 05/05/2019   Lab Results  Component Value Date   WBC 7.3 01/27/2021   NEUTROABS 4.8 01/27/2021   HGB 13.3 01/27/2021   HCT 41.2 01/27/2021   MCV 85.3 01/27/2021   PLT 272 01/27/2021   Lab Results  Component Value Date   IRON 73 01/27/2021   TIBC 315 01/27/2021   IRONPCTSAT 23 01/27/2021   Lab Results  Component Value Date   FERRITIN 56 01/27/2021    STUDIES: No results found.  ASSESSMENT: Iron deficiency anemia.  PLAN:    1.  Iron deficiency anemia: s/p normal colonoscopy and egd. Has not had capsule study d/t previous covid. See below. I've encouraged him to follow up with GI. Iron stores have been replaced. Hmb 13.3, no microcytosis. Ferritin 56, iron sat 23%.   2.  Lumbar radiculopathy: s/p surgery at L4-L5 with Dr. Lacinda Axon ([place])  3.  Bloody diarrhea: follow up with GI.   4. Carpal tunnel - s/p post operative  Disposition-  3 mo- lab (cbc, cmp, ferritin, iron studies) Day to week later see [place] virtually - [place]  I discussed the assessment and treatment plan with the patient. The patient was provided an opportunity to ask questions and all were answered. The patient agreed with the plan and demonstrated an understanding of the instructions.   The patient was advised to call back or seek an in-person evaluation if the symptoms worsen or if the condition fails to improve as anticipated.   I spent 18 minutes face-to-face video visit time dedicated to the care of this patient on the date of this encounter to include pre-visit review of hematology notes and flowsheet data, face-to-face time with the patient, and post visit ordering of testing/documentation.    Verlon Au, NP   02/10/2021

## 2021-02-10 NOTE — Progress Notes (Signed)
Pt confirmed availability for mychart visit as scheduled. Pt c/o fatigue. No other concerns mentioned at this time.

## 2021-02-14 ENCOUNTER — Other Ambulatory Visit: Payer: Self-pay | Admitting: Nurse Practitioner

## 2021-02-14 ENCOUNTER — Other Ambulatory Visit: Payer: Self-pay | Admitting: Internal Medicine

## 2021-02-26 DIAGNOSIS — W458XXD Other foreign body or object entering through skin, subsequent encounter: Secondary | ICD-10-CM | POA: Diagnosis not present

## 2021-02-26 DIAGNOSIS — M1812 Unilateral primary osteoarthritis of first carpometacarpal joint, left hand: Secondary | ICD-10-CM | POA: Diagnosis not present

## 2021-02-26 DIAGNOSIS — S61211A Laceration without foreign body of left index finger without damage to nail, initial encounter: Secondary | ICD-10-CM | POA: Insufficient documentation

## 2021-02-26 DIAGNOSIS — Z9889 Other specified postprocedural states: Secondary | ICD-10-CM | POA: Diagnosis not present

## 2021-02-26 DIAGNOSIS — S61211D Laceration without foreign body of left index finger without damage to nail, subsequent encounter: Secondary | ICD-10-CM | POA: Diagnosis not present

## 2021-03-05 ENCOUNTER — Encounter: Payer: Self-pay | Admitting: Oncology

## 2021-03-05 NOTE — Progress Notes (Signed)
Appt cancelled by patient

## 2021-03-08 ENCOUNTER — Encounter: Payer: Self-pay | Admitting: Nurse Practitioner

## 2021-03-16 ENCOUNTER — Other Ambulatory Visit: Payer: Self-pay | Admitting: Nurse Practitioner

## 2021-03-16 DIAGNOSIS — J0110 Acute frontal sinusitis, unspecified: Secondary | ICD-10-CM

## 2021-03-16 DIAGNOSIS — R051 Acute cough: Secondary | ICD-10-CM

## 2021-03-17 NOTE — Telephone Encounter (Signed)
meds sent

## 2021-03-23 DIAGNOSIS — M5417 Radiculopathy, lumbosacral region: Secondary | ICD-10-CM | POA: Diagnosis not present

## 2021-03-23 DIAGNOSIS — G894 Chronic pain syndrome: Secondary | ICD-10-CM | POA: Diagnosis not present

## 2021-03-23 DIAGNOSIS — Z79891 Long term (current) use of opiate analgesic: Secondary | ICD-10-CM | POA: Diagnosis not present

## 2021-03-23 DIAGNOSIS — M961 Postlaminectomy syndrome, not elsewhere classified: Secondary | ICD-10-CM | POA: Diagnosis not present

## 2021-04-02 ENCOUNTER — Other Ambulatory Visit: Payer: Self-pay | Admitting: Nurse Practitioner

## 2021-04-02 DIAGNOSIS — R051 Acute cough: Secondary | ICD-10-CM

## 2021-04-03 ENCOUNTER — Telehealth: Payer: Self-pay | Admitting: Oncology

## 2021-04-03 ENCOUNTER — Other Ambulatory Visit: Payer: Self-pay | Admitting: Nurse Practitioner

## 2021-04-03 DIAGNOSIS — E291 Testicular hypofunction: Secondary | ICD-10-CM

## 2021-04-03 NOTE — Telephone Encounter (Signed)
Pt called and stated that he has been feeling weak and dizzy, Thinks he has a low iron. He would like to be seen. I stated I would have to speak with the clinical staff before scheduling anything. Please advise on what you would like scheduled .

## 2021-04-03 NOTE — Telephone Encounter (Signed)
Returned patient call. Per Dr. Grayland Ormond he can be scheduled for labs. If iron is low, we will schedule for venofer. If labs are reassuring, pt informed to follow up with PCP.  Can one of you get him scheduled for lab encounter.

## 2021-04-03 NOTE — Telephone Encounter (Signed)
Spoke to Pt, he is aware of his lab appt.

## 2021-04-03 NOTE — Telephone Encounter (Signed)
Med sent to pharmacy.

## 2021-04-04 ENCOUNTER — Other Ambulatory Visit: Payer: Self-pay | Admitting: Nurse Practitioner

## 2021-04-04 DIAGNOSIS — N401 Enlarged prostate with lower urinary tract symptoms: Secondary | ICD-10-CM

## 2021-04-07 ENCOUNTER — Other Ambulatory Visit: Payer: Self-pay

## 2021-04-07 ENCOUNTER — Other Ambulatory Visit: Payer: Self-pay | Admitting: Emergency Medicine

## 2021-04-07 ENCOUNTER — Inpatient Hospital Stay: Payer: BC Managed Care – PPO | Attending: Oncology

## 2021-04-07 DIAGNOSIS — D500 Iron deficiency anemia secondary to blood loss (chronic): Secondary | ICD-10-CM

## 2021-04-07 DIAGNOSIS — Z79899 Other long term (current) drug therapy: Secondary | ICD-10-CM | POA: Insufficient documentation

## 2021-04-07 DIAGNOSIS — D509 Iron deficiency anemia, unspecified: Secondary | ICD-10-CM | POA: Insufficient documentation

## 2021-04-07 LAB — CBC WITH DIFFERENTIAL/PLATELET
Abs Immature Granulocytes: 0.01 10*3/uL (ref 0.00–0.07)
Basophils Absolute: 0.1 10*3/uL (ref 0.0–0.1)
Basophils Relative: 2 %
Eosinophils Absolute: 0.3 10*3/uL (ref 0.0–0.5)
Eosinophils Relative: 5 %
HCT: 27.1 % — ABNORMAL LOW (ref 39.0–52.0)
Hemoglobin: 8.5 g/dL — ABNORMAL LOW (ref 13.0–17.0)
Immature Granulocytes: 0 %
Lymphocytes Relative: 21 %
Lymphs Abs: 1.3 10*3/uL (ref 0.7–4.0)
MCH: 25.8 pg — ABNORMAL LOW (ref 26.0–34.0)
MCHC: 31.4 g/dL (ref 30.0–36.0)
MCV: 82.4 fL (ref 80.0–100.0)
Monocytes Absolute: 0.6 10*3/uL (ref 0.1–1.0)
Monocytes Relative: 10 %
Neutro Abs: 4 10*3/uL (ref 1.7–7.7)
Neutrophils Relative %: 62 %
Platelets: 314 10*3/uL (ref 150–400)
RBC: 3.29 MIL/uL — ABNORMAL LOW (ref 4.22–5.81)
RDW: 13.3 % (ref 11.5–15.5)
WBC: 6.3 10*3/uL (ref 4.0–10.5)
nRBC: 0 % (ref 0.0–0.2)

## 2021-04-07 LAB — IRON AND TIBC
Iron: 14 ug/dL — ABNORMAL LOW (ref 45–182)
Saturation Ratios: 3 % — ABNORMAL LOW (ref 17.9–39.5)
TIBC: 428 ug/dL (ref 250–450)
UIBC: 414 ug/dL

## 2021-04-07 LAB — FERRITIN: Ferritin: 5 ng/mL — ABNORMAL LOW (ref 24–336)

## 2021-04-10 ENCOUNTER — Other Ambulatory Visit: Payer: Self-pay

## 2021-04-10 ENCOUNTER — Inpatient Hospital Stay: Payer: BC Managed Care – PPO

## 2021-04-10 VITALS — BP 135/74 | HR 74 | Temp 97.6°F | Resp 18

## 2021-04-10 DIAGNOSIS — Z79899 Other long term (current) drug therapy: Secondary | ICD-10-CM | POA: Diagnosis not present

## 2021-04-10 DIAGNOSIS — D508 Other iron deficiency anemias: Secondary | ICD-10-CM

## 2021-04-10 DIAGNOSIS — D509 Iron deficiency anemia, unspecified: Secondary | ICD-10-CM | POA: Diagnosis not present

## 2021-04-10 MED ORDER — SODIUM CHLORIDE 0.9 % IV SOLN: 200.0000 mg | Freq: Once | INTRAVENOUS | Status: DC

## 2021-04-10 MED ORDER — IRON SUCROSE 20 MG/ML IV SOLN
200.0000 mg | Freq: Once | INTRAVENOUS | Status: AC
  Administered 2021-04-10: 200 mg via INTRAVENOUS
  Filled 2021-04-10: qty 10

## 2021-04-10 MED ORDER — SODIUM CHLORIDE 0.9 % IV SOLN
Freq: Once | INTRAVENOUS | Status: AC
  Filled 2021-04-10: qty 250

## 2021-04-10 NOTE — Patient Instructions (Signed)
[facility]   ?Discharge Instructions: ?Thank you for choosing [facility] to provide your oncology and hematology care.  ?If you have a lab appointment with the Cancer Center, please go directly to the Cancer Center and check in at the registration area. ?  ?We strive to give you quality time with your provider. You may need to reschedule your appointment if you arrive late (15 or more minutes).  Arriving late affects you and other patients whose appointments are after yours.  Also, if you miss three or more appointments without notifying the office, you may be dismissed from the clinic at the provider?s discretion.    ?  ?For prescription refill requests, have your pharmacy contact our office and allow 72 hours for refills to be completed.   ? ?Today you received the following: Venofer.    ?  ?BELOW ARE SYMPTOMS THAT SHOULD BE REPORTED IMMEDIATELY: ?*FEVER GREATER THAN 100.4 F (38 ?C) OR HIGHER ?*CHILLS OR SWEATING ?*NAUSEA AND VOMITING THAT IS NOT CONTROLLED WITH YOUR NAUSEA MEDICATION ?*UNUSUAL SHORTNESS OF BREATH ?*UNUSUAL BRUISING OR BLEEDING ?*URINARY PROBLEMS (pain or burning when urinating, or frequent urination) ?*BOWEL PROBLEMS (unusual diarrhea, constipation, pain near the anus) ?TENDERNESS IN MOUTH AND THROAT WITH OR WITHOUT PRESENCE OF ULCERS (sore throat, sores in mouth, or a toothache) ?UNUSUAL RASH, SWELLING OR PAIN  ?UNUSUAL VAGINAL DISCHARGE OR ITCHING  ? ?Items with * indicate a potential emergency and should be followed up as soon as possible or go to the Emergency Department if any problems should occur. ? ?Should you have questions after your visit or need to cancel or reschedule your appointment, please contact [facility]  Dept: [phone]  and follow the prompts.  Office hours are 8:00 a.m. to 4:30 p.m. Monday - Friday. Please note that voicemails left after 4:00 p.m. may not be returned until the following  business day.  We are closed weekends and major holidays. You have access to a nurse at all times for urgent questions. Please call the main number to the clinic Dept: [phone] and follow the prompts. ? ?For any non-urgent questions, you may also contact your provider using MyChart. We now offer e-Visits for anyone 18 and older to request care online for non-urgent symptoms. For details visit [email or web address]. ?  ?Also download the MyChart app! Go to the app store, search "MyChart", open the app, select [facility], and log in with your MyChart username and password. ? ?Due to Covid, a mask is required upon entering the hospital/clinic. If you do not have a mask, one will be given to you upon arrival. For doctor visits, patients may have 1 support person aged 18 or older with them. For treatment visits, patients cannot have anyone with them due to current Covid guidelines and our immunocompromised population.  ?

## 2021-04-13 ENCOUNTER — Other Ambulatory Visit: Payer: Self-pay

## 2021-04-13 ENCOUNTER — Inpatient Hospital Stay: Payer: BC Managed Care – PPO

## 2021-04-13 VITALS — BP 122/67 | HR 73 | Temp 96.7°F | Resp 16

## 2021-04-13 DIAGNOSIS — Z79899 Other long term (current) drug therapy: Secondary | ICD-10-CM | POA: Diagnosis not present

## 2021-04-13 DIAGNOSIS — D508 Other iron deficiency anemias: Secondary | ICD-10-CM

## 2021-04-13 DIAGNOSIS — D509 Iron deficiency anemia, unspecified: Secondary | ICD-10-CM | POA: Diagnosis not present

## 2021-04-13 MED ORDER — IRON SUCROSE 20 MG/ML IV SOLN
200.0000 mg | Freq: Once | INTRAVENOUS | Status: AC
  Administered 2021-04-13: 200 mg via INTRAVENOUS
  Filled 2021-04-13: qty 10

## 2021-04-13 MED ORDER — SODIUM CHLORIDE 0.9 % IV SOLN
INTRAVENOUS | Status: DC
  Filled 2021-04-13: qty 250

## 2021-04-13 MED ORDER — SODIUM CHLORIDE 0.9 % IV SOLN: 200.0000 mg | Freq: Once | INTRAVENOUS | Status: DC

## 2021-04-15 ENCOUNTER — Other Ambulatory Visit: Payer: Self-pay

## 2021-04-15 ENCOUNTER — Inpatient Hospital Stay: Payer: BC Managed Care – PPO

## 2021-04-15 VITALS — BP 125/65 | HR 74 | Temp 97.4°F

## 2021-04-15 DIAGNOSIS — D509 Iron deficiency anemia, unspecified: Secondary | ICD-10-CM | POA: Diagnosis not present

## 2021-04-15 DIAGNOSIS — Z79899 Other long term (current) drug therapy: Secondary | ICD-10-CM | POA: Diagnosis not present

## 2021-04-15 DIAGNOSIS — D508 Other iron deficiency anemias: Secondary | ICD-10-CM

## 2021-04-15 MED ORDER — SODIUM CHLORIDE 0.9 % IV SOLN: 200.0000 mg | Freq: Once | INTRAVENOUS | Status: DC

## 2021-04-15 MED ORDER — IRON SUCROSE 20 MG/ML IV SOLN
200.0000 mg | Freq: Once | INTRAVENOUS | Status: AC
  Administered 2021-04-15: 200 mg via INTRAVENOUS
  Filled 2021-04-15: qty 10

## 2021-04-15 MED ORDER — SODIUM CHLORIDE 0.9 % IV SOLN
INTRAVENOUS | Status: DC | PRN
  Filled 2021-04-15: qty 250

## 2021-04-17 ENCOUNTER — Inpatient Hospital Stay: Payer: BC Managed Care – PPO

## 2021-04-17 ENCOUNTER — Other Ambulatory Visit: Payer: Self-pay

## 2021-04-17 VITALS — BP 144/67 | HR 73

## 2021-04-17 DIAGNOSIS — D508 Other iron deficiency anemias: Secondary | ICD-10-CM

## 2021-04-17 DIAGNOSIS — Z79899 Other long term (current) drug therapy: Secondary | ICD-10-CM | POA: Diagnosis not present

## 2021-04-17 DIAGNOSIS — D509 Iron deficiency anemia, unspecified: Secondary | ICD-10-CM | POA: Diagnosis not present

## 2021-04-17 MED ORDER — SODIUM CHLORIDE 0.9 % IV SOLN
INTRAVENOUS | Status: DC
  Filled 2021-04-17: qty 250

## 2021-04-17 MED ORDER — IRON SUCROSE 20 MG/ML IV SOLN
200.0000 mg | Freq: Once | INTRAVENOUS | Status: AC
  Administered 2021-04-17: 200 mg via INTRAVENOUS
  Filled 2021-04-17 (×2): qty 5

## 2021-04-17 MED ORDER — SODIUM CHLORIDE 0.9 % IV SOLN: 200.0000 mg | Freq: Once | INTRAVENOUS | Status: DC

## 2021-04-20 ENCOUNTER — Inpatient Hospital Stay: Payer: BC Managed Care – PPO

## 2021-04-24 ENCOUNTER — Telehealth: Payer: Self-pay | Admitting: *Deleted

## 2021-04-24 NOTE — Telephone Encounter (Signed)
Tiffany/Nikki/Brooke- FYI ?

## 2021-04-24 NOTE — Telephone Encounter (Signed)
Thanks Heather H ?

## 2021-04-24 NOTE — Telephone Encounter (Signed)
Patient left vm on scheduling phone. Pt initially inquiring if he should r/s his IV iron treatment that he missed on 2/27. When I called patient back, he said that Colette already called him back and informed patient to keep his apt as schedule.  ? ?Patient still wanted to talk to me about his next iron infusion. He had decided to change this apt on 3/21 to a mychart visit as he may or may not need IV iron that afternoon. I arranged this for the patient. He thanked me for assisting him today. ?

## 2021-05-01 ENCOUNTER — Other Ambulatory Visit: Payer: Self-pay | Admitting: Nurse Practitioner

## 2021-05-01 DIAGNOSIS — E039 Hypothyroidism, unspecified: Secondary | ICD-10-CM

## 2021-05-05 ENCOUNTER — Ambulatory Visit (INDEPENDENT_AMBULATORY_CARE_PROVIDER_SITE_OTHER): Payer: BC Managed Care – PPO | Admitting: Nurse Practitioner

## 2021-05-05 ENCOUNTER — Other Ambulatory Visit: Payer: Self-pay

## 2021-05-05 ENCOUNTER — Encounter: Payer: Self-pay | Admitting: Nurse Practitioner

## 2021-05-05 VITALS — BP 120/53 | HR 80 | Temp 98.8°F | Resp 16 | Ht 72.0 in | Wt 212.4 lb

## 2021-05-05 DIAGNOSIS — G47429 Narcolepsy in conditions classified elsewhere without cataplexy: Secondary | ICD-10-CM | POA: Diagnosis not present

## 2021-05-05 DIAGNOSIS — R051 Acute cough: Secondary | ICD-10-CM | POA: Diagnosis not present

## 2021-05-05 DIAGNOSIS — F410 Panic disorder [episodic paroxysmal anxiety] without agoraphobia: Secondary | ICD-10-CM

## 2021-05-05 DIAGNOSIS — F411 Generalized anxiety disorder: Secondary | ICD-10-CM | POA: Diagnosis not present

## 2021-05-05 DIAGNOSIS — Z23 Encounter for immunization: Secondary | ICD-10-CM

## 2021-05-05 DIAGNOSIS — J0110 Acute frontal sinusitis, unspecified: Secondary | ICD-10-CM | POA: Diagnosis not present

## 2021-05-05 MED ORDER — AMPHETAMINE-DEXTROAMPHETAMINE 20 MG PO TABS: 20.0000 mg | ORAL_TABLET | Freq: Three times a day (TID) | ORAL | 0 refills | Status: DC

## 2021-05-05 MED ORDER — AMPHETAMINE-DEXTROAMPHETAMINE 20 MG PO TABS
20.0000 mg | ORAL_TABLET | Freq: Three times a day (TID) | ORAL | 0 refills | Status: DC
Start: 2021-05-05 — End: 2021-08-04

## 2021-05-05 MED ORDER — AZITHROMYCIN 250 MG PO TABS: ORAL_TABLET | ORAL | 0 refills | Status: AC

## 2021-05-05 MED ORDER — BENZONATATE 100 MG PO CAPS: ORAL_CAPSULE | ORAL | 0 refills | Status: DC

## 2021-05-05 MED ORDER — TETANUS-DIPHTH-ACELL PERTUSSIS 5-2.5-18.5 LF-MCG/0.5 IM SUSP
0.5000 mL | Freq: Once | INTRAMUSCULAR | 0 refills | Status: AC
Start: 2021-05-05 — End: 2021-05-05

## 2021-05-05 MED ORDER — ALPRAZOLAM 0.5 MG PO TABS: 0.5000 mg | ORAL_TABLET | Freq: Three times a day (TID) | ORAL | 2 refills | Status: DC | PRN

## 2021-05-05 NOTE — Progress Notes (Signed)
[facility] ?[address] ?[place], [place] [address] ? ?Internal MEDICINE  ?Office Visit Note ? ?Patient Name: Randy Mclean Younger ? [id number]  ?[id number] ? ?Date of Service: 05/05/2021 ? ?Chief Complaint  ?Patient presents with  ? Follow-up  ? Depression  ? Gastroesophageal Reflux  ? Hyperlipidemia  ? Hypertension  ? Asthma  ? Anemia  ? ? ?HPI ?Randy Mclean presents for follow-up visit for hyperlipidemia, hypertension, depression, narcolepsy and insomnia.  Patient reports that his sister was recently diagnosed with small cell lung cancer and initially her death was imminent.  He states that his sister was told that she without treatment she may only have as much as 3 months to live but with treatment she may live up to a year.  Either way, the patient reports that he is processing and dealing with knowing that his sister has a terminal disease.  He states that he has not talked to his sister in a few years so this situation is unfortunate but has brought the family together again.  ?The patient is due for refills of alprazolam and Adderall and is also due for a tetanus booster. ?He has also been experiencing symptoms of a sinus infection including nasal congestion, sinus drainage, sinus pressure, headache, cough that is worse at night, intermittent shortness of breath, runny nose, intermittent wheezing. ? ? ?Current Medication: ?Outpatient Encounter Medications as of 05/05/2021  ?Medication Sig  ? albuterol (VENTOLIN HFA) 108 (90 Base) MCG/ACT inhaler Inhale 2 puffs into the lungs every 4 (four) hours as needed for wheezing or shortness of breath.  ? alfuzosin (UROXATRAL) 10 MG 24 hr tablet TAKE 1 TABLET(10 MG) BY MOUTH DAILY WITH BREAKFAST  ? amLODipine (NORVASC) 5 MG tablet Take 10 mg by mouth daily.  ? atorvastatin (LIPITOR) 10 MG tablet TAKE 1 TABLET BY MOUTH DAILY  ? azithromycin (ZITHROMAX) 250 MG tablet Take 2 tablets on day 1, then 1 tablet daily on days 2 through 5  ? buPROPion (WELLBUTRIN XL) 300  MG 24 hr tablet TAKE 1 TABLET BY MOUTH DAILY GENERIC EQUIVALENT FOR WELLBUTRIN XL. DUE FOR OFFICE VISIT.  ? citalopram (CELEXA) 20 MG tablet Take 1 tablet (20 mg total) by mouth daily.  ? esomeprazole (NEXIUM) 40 MG capsule TK ONE C PO  BID UTD  ? l-methylfolate-B6-B12 (METANX) 3-35-2 MG TABS Take 1 tablet by mouth 2 (two) times daily.  ? levothyroxine (SYNTHROID) 150 MCG tablet TAKE 1 TABLET BY MOUTH DAILY BEFORE BREAKFAST  ? meloxicam (MOBIC) 7.5 MG tablet TAKE 1 TABLET BY MOUTH 2 TIMES DAILY  ? methocarbamol (ROBAXIN) 750 MG tablet Take 750 mg by mouth 4 (four) times daily as needed.  ? mometasone-formoterol (DULERA) 100-5 MCG/ACT AERO INHALE 2 PUFFS BY MOUTH INTO THE LUNGS EVERY 12 HOURS  ? MOVANTIK 25 MG TABS tablet Take 25 mg by mouth daily.  ? Multiple Vitamin (MULTI-VITAMINS) TABS Take by mouth.  ? oxyCODONE-acetaminophen (PERCOCET) 7.5-325 MG per tablet Take 1 tablet by mouth every 4 (four) hours as needed for pain.   ? pregabalin (LYRICA) 100 MG capsule Take 100 mg by mouth every 8 (eight) hours.  ? pregabalin (LYRICA) 50 MG capsule Take 100 mg by mouth 3 (three) times daily.   ? SYRINGE-NEEDLE, DISP, 3 ML 22G X 1" 3 ML MISC Use as directed.  ? testosterone cypionate (DEPOTESTOSTERONE CYPIONATE) 200 MG/ML injection INJECT 0.75 MLS INTO THE MUSCLE EVERY 7 DAYS AS DIRECTED  ? tiZANidine (ZANAFLEX) 4 MG tablet Take 4 mg by mouth every  6 (six) hours as needed for muscle spasms. Pt takes one tablet at night  ? valsartan (DIOVAN) 160 MG tablet Take 160 mg by mouth daily.  ? [DISCONTINUED] alfuzosin (UROXATRAL) 10 MG 24 hr tablet TAKE 1 TABLET BY MOUTH DAILY  ? [DISCONTINUED] ALPRAZolam (XANAX) 0.5 MG tablet Take 1 tablet (0.5 mg total) by mouth 3 (three) times daily as needed for anxiety.  ? [DISCONTINUED] amphetamine-dextroamphetamine (ADDERALL) 20 MG tablet Take 1 tablet (20 mg total) by mouth 3 (three) times daily.  ? [DISCONTINUED] amphetamine-dextroamphetamine (ADDERALL) 20 MG tablet Take 1 tablet (20 mg  total) by mouth 3 (three) times daily.  ? [DISCONTINUED] amphetamine-dextroamphetamine (ADDERALL) 20 MG tablet Take 1 tablet (20 mg total) by mouth 3 (three) times daily.  ? [DISCONTINUED] amphetamine-dextroamphetamine (ADDERALL) 20 MG tablet Take 1 tablet (20 mg total) by mouth 3 (three) times daily.  ? [DISCONTINUED] amphetamine-dextroamphetamine (ADDERALL) 20 MG tablet Take 1 tablet (20 mg total) by mouth 3 (three) times daily.  ? [DISCONTINUED] benzonatate (TESSALON) 100 MG capsule TAKE 1 CAPSULE(100 MG) BY MOUTH TWICE DAILY AS NEEDED FOR COUGH  ? [DISCONTINUED] doxycycline (VIBRA-TABS) 100 MG tablet TAKE 1 TABLET(100 MG) BY MOUTH TWICE DAILY WITH FOOD  ? [DISCONTINUED] Tdap (BOOSTRIX) 5-2.5-18.5 LF-MCG/0.5 injection Inject 0.5 mLs into the muscle once.  ? ALPRAZolam (XANAX) 0.5 MG tablet Take 1 tablet (0.5 mg total) by mouth 3 (three) times daily as needed for anxiety.  ? amphetamine-dextroamphetamine (ADDERALL) 20 MG tablet Take 1 tablet (20 mg total) by mouth 3 (three) times daily.  ? [START ON 06/02/2021] amphetamine-dextroamphetamine (ADDERALL) 20 MG tablet Take 1 tablet (20 mg total) by mouth 3 (three) times daily.  ? [START ON 06/30/2021] amphetamine-dextroamphetamine (ADDERALL) 20 MG tablet Take 1 tablet (20 mg total) by mouth 3 (three) times daily.  ? benzonatate (TESSALON) 100 MG capsule TAKE 1 CAPSULE(100 MG) BY MOUTH TWICE DAILY AS NEEDED FOR COUGH  ? hydrochlorothiazide (HYDRODIURIL) 12.5 MG tablet Take by mouth.  ? Tdap (BOOSTRIX) 5-2.5-18.5 LF-MCG/0.5 injection Inject 0.5 mLs into the muscle once for 1 dose.  ? [DISCONTINUED] NUCYNTA ER 200 MG TB12 200 mg bid  ? ?No facility-administered encounter medications on file as of 05/05/2021.  ? ? ?Surgical History: ?Past Surgical History:  ?Procedure Laterality Date  ? ANTERIOR CERVICAL DISCECTOMY    ? APPENDECTOMY    ? BACK SURGERY    ? BACK SURGERY  12/04/2020  ? COLONOSCOPY  09/2013  ? elbow surgery    ? HEMORRHOID SURGERY    ? LAPAROSCOPIC APPENDECTOMY  N/A 12/04/2017  ? Procedure: APPENDECTOMY LAPAROSCOPIC;  Surgeon: Vickie Epley, MD;  Location: [facility] ORS;  Service: General;  Laterality: N/A;  ? LIPOMA EXCISION Right   ? leg  ? 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    ? SPINAL CORD STIMULATOR IMPLANT    ? UPPER GI ENDOSCOPY  09/2013  ? WRIST SURGERY Left 01/14/2021  ? ? ?Medical History: ?Past Medical History:  ?Diagnosis Date  ? Acute appendicitis 12/04/2017  ? Allergy   ? takes allergy shots  ? Anemia   ? Arthritis   ? Asthma   ? Barrett esophagus   ? Bronchitis   ? Chronic pain   ? Colon polyp   ? Depression   ? GERD (gastroesophageal reflux disease)   ? Hemorrhoid   ? Hyperlipidemia   ? Hypertension   ? Hypothyroid   ? IBS (irritable bowel syndrome)   ? Kidney stone   ? MI (myocardial infarction) ([place])   ?  between 2002 and 2006  ? Migraine   ? Pneumonia   ? Seizures ([place])   ? Sinus problem   ? Spinal cord stimulator status   ? ? ?Family History: ?Family History  ?Problem Relation Age of Onset  ? Hypertension Mother   ? Lung disease Mother   ? Heart attack Father   ? Diabetes Father   ? Liver cancer Sister   ? Lung cancer Sister   ? ? ?Social History  ? ?Socioeconomic History  ? Marital status: Married  ?  Spouse name: Not on file  ? Number of children: Not on file  ? Years of education: Not on file  ? Highest education level: Not on file  ?Occupational History  ? Not on file  ?Tobacco Use  ? Smoking status: Former  ?  Packs/day: 1.00  ?  Years: 30.00  ?  Pack years: 30.00  ?  Types: Cigarettes  ?  Quit date: 02/22/2005  ?  Years since quitting: 16.2  ? Smokeless tobacco: Never  ?Vaping Use  ? Vaping Use: Never used  ?Substance and Sexual Activity  ? Alcohol use: Yes  ?  Alcohol/week: 0.0 standard drinks  ?  Comment: occasionally  ? Drug use: No  ?  Comment: past  ? Sexual activity: Yes  ?Other Topics Concern  ? Not on file  ?Social History Narrative  ? Not on file  ? ?Social Determinants of Health  ? ?Financial Resource Strain: Not on file  ?Food  Insecurity: Not on file  ?Transportation Needs: Not on file  ?Physical Activity: Not on file  ?Stress: Not on file  ?Social Connections: Not on file  ?Intimate Partner Violence: Not on file  ? ? ? ? ?Review of

## 2021-05-06 ENCOUNTER — Encounter: Payer: Self-pay | Admitting: Nurse Practitioner

## 2021-05-10 NOTE — Progress Notes (Signed)
?[facility]  ?[phone] [phone] ? ?ID: Randy Mclean OB: [birth year]  MR#: [id number]  HWE#:[id number] ? ?Patient Care Team: ?Jonetta Osgood, NP as PCP - General (Nurse Practitioner) ?Robert Bellow, MD (General Surgery) ?Lavera Guise, MD (Internal Medicine) ?Lloyd Huger, MD as Consulting Physician (Oncology) ? ?I connected with [name] on 05/14/21 at  9:30 AM EDT by video enabled telemedicine visit and verified that I am speaking with the correct person using two identifiers.  ? ?I discussed the limitations, risks, security and privacy concerns of performing an evaluation and management service by telemedicine and the availability of in-person appointments. I also discussed with the patient that there may be a patient responsible charge related to this service. The patient expressed understanding and agreed to proceed.  ? ?Other persons participating in the visit and their role in the encounter: Patient, MD. ? ?Patient?s location: Home. ?Provider?s location: Clinic. ? ?CHIEF COMPLAINT: Iron deficiency anemia. ? ?INTERVAL HISTORY: Patient agreed to video assisted telemedicine visit today, but this was transitioned to telephone after experiencing technical difficulties.  He continues to have chronic weakness and fatigue, but otherwise feels well. He has no neurologic complaints.  He denies any recent fevers or illnesses.  He has no chest pain, shortness of breath, cough, or hemoptysis.  He denies any nausea, vomiting, constipation, or diarrhea.  He has no melena or hematochezia.  He has no urinary complaints.  Patient offers no further specific complaints today. ? ?REVIEW OF SYSTEMS:   ?Review of Systems  ?Constitutional:  Positive for malaise/fatigue. Negative for fever and weight loss.  ?Respiratory: Negative.  Negative for cough and shortness of breath.   ?Cardiovascular: Negative.  Negative for chest pain and leg swelling.   ?Gastrointestinal: Negative.  Negative for abdominal pain, blood in stool and melena.  ?Genitourinary: Negative.  Negative for hematuria.  ?Musculoskeletal: Negative.  Negative for back pain and neck pain.  ?Skin: Negative.  Negative for rash.  ?Neurological:  Positive for weakness. Negative for dizziness, sensory change, focal weakness and headaches.  ?Psychiatric/Behavioral: Negative.  The patient is not nervous/anxious.   ? ?As per HPI. Otherwise, a complete review of systems is negative. ? ?PAST MEDICAL HISTORY: ?Past Medical History:  ?Diagnosis Date  ? Acute appendicitis 12/04/2017  ? Allergy   ? takes allergy shots  ? Anemia   ? Arthritis   ? Asthma   ? Barrett esophagus   ? Bronchitis   ? Chronic pain   ? Colon polyp   ? Depression   ? GERD (gastroesophageal reflux disease)   ? Hemorrhoid   ? Hyperlipidemia   ? Hypertension   ? Hypothyroid   ? IBS (irritable bowel syndrome)   ? Kidney stone   ? MI (myocardial infarction) ([place])   ? between 2002 and 2006  ? Migraine   ? Pneumonia   ? Seizures ([place])   ? Sinus problem   ? Spinal cord stimulator status   ? ? ?PAST SURGICAL HISTORY: ?Past Surgical History:  ?Procedure Laterality Date  ? ANTERIOR CERVICAL DISCECTOMY    ? APPENDECTOMY    ? BACK SURGERY    ? BACK SURGERY  12/04/2020  ? COLONOSCOPY  09/2013  ? elbow surgery    ? HEMORRHOID SURGERY    ? LAPAROSCOPIC APPENDECTOMY N/A 12/04/2017  ? Procedure: APPENDECTOMY LAPAROSCOPIC;  Surgeon: Vickie Epley, MD;  Location: [facility] ORS;  Service: General;  Laterality: N/A;  ? LIPOMA EXCISION Right   ?  leg  ? 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    ? SPINAL CORD STIMULATOR IMPLANT    ? UPPER GI ENDOSCOPY  09/2013  ? WRIST SURGERY Left 01/14/2021  ? ? ?FAMILY HISTORY ?Family History  ?Problem Relation Age of Onset  ? Hypertension Mother   ? Lung disease Mother   ? Heart attack Father   ? Diabetes Father   ? Liver cancer Sister   ? Lung cancer Sister   ? ? ?  ? ADVANCED DIRECTIVES:  ? ? ?HEALTH MAINTENANCE: ?Social  History  ? ?Tobacco Use  ? Smoking status: Former  ?  Packs/day: 1.00  ?  Years: 30.00  ?  Pack years: 30.00  ?  Types: Cigarettes  ?  Quit date: 02/22/2005  ?  Years since quitting: 16.2  ? Smokeless tobacco: Never  ?Vaping Use  ? Vaping Use: Never used  ?Substance Use Topics  ? Alcohol use: Yes  ?  Alcohol/week: 0.0 standard drinks  ?  Comment: occasionally  ? Drug use: No  ?  Comment: past  ? ? ? Colonoscopy: ? PAP: ? Bone density: ? Lipid panel: ? ?Allergies  ?Allergen Reactions  ? Amoxicillin Anaphylaxis  ?  REACTION: unspecified  ? Penicillin G Anaphylaxis  ? Gabapentin Other (See Comments)  ?  REACTION: anaphylaxis  ? Penicillins   ?  REACTION: unspecified  ? ? ?Current Outpatient Medications  ?Medication Sig Dispense Refill  ? albuterol (VENTOLIN HFA) 108 (90 Base) MCG/ACT inhaler Inhale 2 puffs into the lungs every 4 (four) hours as needed for wheezing or shortness of breath. 8.5 g 3  ? alfuzosin (UROXATRAL) 10 MG 24 hr tablet TAKE 1 TABLET(10 MG) BY MOUTH DAILY WITH BREAKFAST 90 tablet 1  ? ALPRAZolam (XANAX) 0.5 MG tablet Take 1 tablet (0.5 mg total) by mouth 3 (three) times daily as needed for anxiety. 90 tablet 2  ? amLODipine (NORVASC) 5 MG tablet Take 10 mg by mouth daily.    ? amphetamine-dextroamphetamine (ADDERALL) 20 MG tablet Take 1 tablet (20 mg total) by mouth 3 (three) times daily. 90 tablet 0  ? [START ON 06/02/2021] amphetamine-dextroamphetamine (ADDERALL) 20 MG tablet Take 1 tablet (20 mg total) by mouth 3 (three) times daily. 90 tablet 0  ? [START ON 06/30/2021] amphetamine-dextroamphetamine (ADDERALL) 20 MG tablet Take 1 tablet (20 mg total) by mouth 3 (three) times daily. 90 tablet 0  ? atorvastatin (LIPITOR) 10 MG tablet TAKE 1 TABLET BY MOUTH DAILY 90 tablet 1  ? benzonatate (TESSALON) 100 MG capsule TAKE 1 CAPSULE(100 MG) BY MOUTH TWICE DAILY AS NEEDED FOR COUGH 30 capsule 0  ? buPROPion (WELLBUTRIN XL) 300 MG 24 hr tablet TAKE 1 TABLET BY MOUTH DAILY GENERIC EQUIVALENT FOR WELLBUTRIN XL.  DUE FOR OFFICE VISIT. 90 tablet 1  ? citalopram (CELEXA) 20 MG tablet Take 1 tablet (20 mg total) by mouth daily. 90 tablet 1  ? esomeprazole (NEXIUM) 40 MG capsule TK ONE C PO  BID UTD  0  ? hydrochlorothiazide (HYDRODIURIL) 12.5 MG tablet Take by mouth.    ? l-methylfolate-B6-B12 (METANX) 3-35-2 MG TABS Take 1 tablet by mouth 2 (two) times daily.    ? levothyroxine (SYNTHROID) 150 MCG tablet TAKE 1 TABLET BY MOUTH DAILY BEFORE BREAKFAST 90 tablet 1  ? magnesium gluconate (MAGONATE) 500 MG tablet Take 500 mg by mouth daily.    ? meloxicam (MOBIC) 7.5 MG tablet TAKE 1 TABLET BY MOUTH 2 TIMES DAILY 180 tablet 1  ? methocarbamol (ROBAXIN) 750  MG tablet Take 750 mg by mouth 4 (four) times daily as needed.    ? mometasone-formoterol (DULERA) 100-5 MCG/ACT AERO INHALE 2 PUFFS BY MOUTH INTO THE LUNGS EVERY 12 HOURS 39 g 3  ? Multiple Vitamin (MULTI-VITAMINS) TABS Take by mouth.    ? oxyCODONE-acetaminophen (PERCOCET) 7.5-325 MG per tablet Take 1 tablet by mouth every 4 (four) hours as needed for pain.     ? pregabalin (LYRICA) 100 MG capsule Take 100 mg by mouth every 8 (eight) hours.    ? SYRINGE-NEEDLE, DISP, 3 ML 22G X 1" 3 ML MISC Use as directed.    ? testosterone cypionate (DEPOTESTOSTERONE CYPIONATE) 200 MG/ML injection INJECT 0.75 MLS INTO THE MUSCLE EVERY 7 DAYS AS DIRECTED 10 mL 1  ? tiZANidine (ZANAFLEX) 4 MG tablet Take 4 mg by mouth every 6 (six) hours as needed for muscle spasms. Pt takes one tablet at night    ? valsartan (DIOVAN) 160 MG tablet Take 160 mg by mouth daily.    ? MOVANTIK 25 MG TABS tablet Take 25 mg by mouth daily. (Patient not taking: Reported on 05/12/2021)    ? pregabalin (LYRICA) 50 MG capsule Take 100 mg by mouth 3 (three) times daily.     ? ?No current facility-administered medications for this visit.  ? ? ?OBJECTIVE: ?There were no vitals filed for this visit. ?   There is no height or weight on file to calculate BMI.    ECOG FS:0 - Asymptomatic ? ?General: Well-developed,  well-nourished, no acute distress. ?HEENT: Normocephalic. ?Neuro: Alert, answering all questions appropriately. Cranial nerves grossly intact. ?Psych: Normal affect. ? ? ?LAB RESULTS: ? ?Lab Results  ?Component Value Da

## 2021-05-11 ENCOUNTER — Inpatient Hospital Stay: Payer: BC Managed Care – PPO | Attending: Nurse Practitioner

## 2021-05-11 ENCOUNTER — Other Ambulatory Visit: Payer: Self-pay

## 2021-05-11 DIAGNOSIS — Z800 Family history of malignant neoplasm of digestive organs: Secondary | ICD-10-CM | POA: Diagnosis not present

## 2021-05-11 DIAGNOSIS — E039 Hypothyroidism, unspecified: Secondary | ICD-10-CM | POA: Insufficient documentation

## 2021-05-11 DIAGNOSIS — Z833 Family history of diabetes mellitus: Secondary | ICD-10-CM | POA: Insufficient documentation

## 2021-05-11 DIAGNOSIS — Z836 Family history of other diseases of the respiratory system: Secondary | ICD-10-CM | POA: Insufficient documentation

## 2021-05-11 DIAGNOSIS — D500 Iron deficiency anemia secondary to blood loss (chronic): Secondary | ICD-10-CM

## 2021-05-11 DIAGNOSIS — D509 Iron deficiency anemia, unspecified: Secondary | ICD-10-CM | POA: Diagnosis not present

## 2021-05-11 DIAGNOSIS — Z801 Family history of malignant neoplasm of trachea, bronchus and lung: Secondary | ICD-10-CM | POA: Insufficient documentation

## 2021-05-11 DIAGNOSIS — Z87891 Personal history of nicotine dependence: Secondary | ICD-10-CM | POA: Diagnosis not present

## 2021-05-11 DIAGNOSIS — R531 Weakness: Secondary | ICD-10-CM | POA: Diagnosis not present

## 2021-05-11 DIAGNOSIS — R5383 Other fatigue: Secondary | ICD-10-CM | POA: Insufficient documentation

## 2021-05-11 DIAGNOSIS — I10 Essential (primary) hypertension: Secondary | ICD-10-CM | POA: Insufficient documentation

## 2021-05-11 DIAGNOSIS — Z8249 Family history of ischemic heart disease and other diseases of the circulatory system: Secondary | ICD-10-CM | POA: Diagnosis not present

## 2021-05-11 DIAGNOSIS — Z79899 Other long term (current) drug therapy: Secondary | ICD-10-CM | POA: Diagnosis not present

## 2021-05-11 DIAGNOSIS — I252 Old myocardial infarction: Secondary | ICD-10-CM | POA: Insufficient documentation

## 2021-05-11 LAB — COMPREHENSIVE METABOLIC PANEL
ALT: 19 U/L (ref 0–44)
AST: 25 U/L (ref 15–41)
Albumin: 4.3 g/dL (ref 3.5–5.0)
Alkaline Phosphatase: 58 U/L (ref 38–126)
Anion gap: 10 (ref 5–15)
BUN: 14 mg/dL (ref 6–20)
CO2: 28 mmol/L (ref 22–32)
Calcium: 8.7 mg/dL — ABNORMAL LOW (ref 8.9–10.3)
Chloride: 98 mmol/L (ref 98–111)
Creatinine, Ser: 1.59 mg/dL — ABNORMAL HIGH (ref 0.61–1.24)
GFR, Estimated: 50 mL/min — ABNORMAL LOW (ref >60)
Glucose, Bld: 95 mg/dL (ref 70–99)
Potassium: 3.3 mmol/L — ABNORMAL LOW (ref 3.5–5.1)
Sodium: 136 mmol/L (ref 135–145)
Total Bilirubin: 0.4 mg/dL (ref 0.3–1.2)
Total Protein: 7.3 g/dL (ref 6.5–8.1)

## 2021-05-11 LAB — CBC WITH DIFFERENTIAL/PLATELET
Abs Immature Granulocytes: 0.04 10*3/uL (ref 0.00–0.07)
Basophils Absolute: 0.1 10*3/uL (ref 0.0–0.1)
Basophils Relative: 1 %
Eosinophils Absolute: 0.2 10*3/uL (ref 0.0–0.5)
Eosinophils Relative: 2 %
HCT: 35.7 % — ABNORMAL LOW (ref 39.0–52.0)
Hemoglobin: 10.6 g/dL — ABNORMAL LOW (ref 13.0–17.0)
Immature Granulocytes: 0 %
Lymphocytes Relative: 13 %
Lymphs Abs: 1.2 10*3/uL (ref 0.7–4.0)
MCH: 23.7 pg — ABNORMAL LOW (ref 26.0–34.0)
MCHC: 29.7 g/dL — ABNORMAL LOW (ref 30.0–36.0)
MCV: 79.9 fL — ABNORMAL LOW (ref 80.0–100.0)
Monocytes Absolute: 0.8 10*3/uL (ref 0.1–1.0)
Monocytes Relative: 9 %
Neutro Abs: 7.2 10*3/uL (ref 1.7–7.7)
Neutrophils Relative %: 75 %
Platelets: 269 10*3/uL (ref 150–400)
RBC: 4.47 MIL/uL (ref 4.22–5.81)
RDW: 16.5 % — ABNORMAL HIGH (ref 11.5–15.5)
WBC: 9.6 10*3/uL (ref 4.0–10.5)
nRBC: 0 % (ref 0.0–0.2)

## 2021-05-11 LAB — IRON AND TIBC
Iron: 37 ug/dL — ABNORMAL LOW (ref 45–182)
Saturation Ratios: 8 % — ABNORMAL LOW (ref 17.9–39.5)
TIBC: 466 ug/dL — ABNORMAL HIGH (ref 250–450)
UIBC: 429 ug/dL

## 2021-05-11 LAB — FERRITIN: Ferritin: 20 ng/mL — ABNORMAL LOW (ref 24–336)

## 2021-05-12 ENCOUNTER — Encounter: Payer: Self-pay | Admitting: Oncology

## 2021-05-12 ENCOUNTER — Inpatient Hospital Stay: Payer: BC Managed Care – PPO

## 2021-05-12 ENCOUNTER — Inpatient Hospital Stay (HOSPITAL_BASED_OUTPATIENT_CLINIC_OR_DEPARTMENT_OTHER): Payer: BC Managed Care – PPO | Admitting: Oncology

## 2021-05-12 VITALS — BP 128/78 | HR 80 | Temp 97.7°F | Resp 16

## 2021-05-12 DIAGNOSIS — Z833 Family history of diabetes mellitus: Secondary | ICD-10-CM | POA: Diagnosis not present

## 2021-05-12 DIAGNOSIS — R5383 Other fatigue: Secondary | ICD-10-CM | POA: Diagnosis not present

## 2021-05-12 DIAGNOSIS — Z79899 Other long term (current) drug therapy: Secondary | ICD-10-CM | POA: Diagnosis not present

## 2021-05-12 DIAGNOSIS — Z8249 Family history of ischemic heart disease and other diseases of the circulatory system: Secondary | ICD-10-CM | POA: Diagnosis not present

## 2021-05-12 DIAGNOSIS — D508 Other iron deficiency anemias: Secondary | ICD-10-CM

## 2021-05-12 DIAGNOSIS — Z800 Family history of malignant neoplasm of digestive organs: Secondary | ICD-10-CM | POA: Diagnosis not present

## 2021-05-12 DIAGNOSIS — I252 Old myocardial infarction: Secondary | ICD-10-CM | POA: Diagnosis not present

## 2021-05-12 DIAGNOSIS — Z836 Family history of other diseases of the respiratory system: Secondary | ICD-10-CM | POA: Diagnosis not present

## 2021-05-12 DIAGNOSIS — D509 Iron deficiency anemia, unspecified: Secondary | ICD-10-CM | POA: Diagnosis not present

## 2021-05-12 DIAGNOSIS — Z87891 Personal history of nicotine dependence: Secondary | ICD-10-CM | POA: Diagnosis not present

## 2021-05-12 DIAGNOSIS — E039 Hypothyroidism, unspecified: Secondary | ICD-10-CM | POA: Diagnosis not present

## 2021-05-12 DIAGNOSIS — R531 Weakness: Secondary | ICD-10-CM | POA: Diagnosis not present

## 2021-05-12 DIAGNOSIS — Z801 Family history of malignant neoplasm of trachea, bronchus and lung: Secondary | ICD-10-CM | POA: Diagnosis not present

## 2021-05-12 DIAGNOSIS — I10 Essential (primary) hypertension: Secondary | ICD-10-CM | POA: Diagnosis not present

## 2021-05-12 MED ORDER — SODIUM CHLORIDE 0.9 % IV SOLN
INTRAVENOUS | Status: DC | PRN
  Filled 2021-05-12: qty 250

## 2021-05-12 MED ORDER — SODIUM CHLORIDE 0.9 % IV SOLN: 200.0000 mg | Freq: Once | INTRAVENOUS | Status: DC

## 2021-05-12 MED ORDER — IRON SUCROSE 20 MG/ML IV SOLN
200.0000 mg | Freq: Once | INTRAVENOUS | Status: AC
  Administered 2021-05-12: 200 mg via INTRAVENOUS
  Filled 2021-05-12: qty 10

## 2021-05-12 NOTE — Progress Notes (Signed)
States he is having dizziness and nausea when standing. States it started a week after tx. No concentration. Mouth is very tender. Lips have been swelling. Noticed over last couple of months has noticed blurry vision.  ?

## 2021-05-13 ENCOUNTER — Telehealth: Payer: BC Managed Care – PPO | Admitting: Oncology

## 2021-05-14 ENCOUNTER — Encounter: Payer: Self-pay | Admitting: Oncology

## 2021-05-15 ENCOUNTER — Inpatient Hospital Stay: Payer: BC Managed Care – PPO

## 2021-05-15 ENCOUNTER — Other Ambulatory Visit: Payer: Self-pay

## 2021-05-15 VITALS — BP 150/80 | HR 73 | Temp 97.5°F

## 2021-05-15 DIAGNOSIS — Z801 Family history of malignant neoplasm of trachea, bronchus and lung: Secondary | ICD-10-CM | POA: Diagnosis not present

## 2021-05-15 DIAGNOSIS — Z800 Family history of malignant neoplasm of digestive organs: Secondary | ICD-10-CM | POA: Diagnosis not present

## 2021-05-15 DIAGNOSIS — R5383 Other fatigue: Secondary | ICD-10-CM | POA: Diagnosis not present

## 2021-05-15 DIAGNOSIS — I252 Old myocardial infarction: Secondary | ICD-10-CM | POA: Diagnosis not present

## 2021-05-15 DIAGNOSIS — Z833 Family history of diabetes mellitus: Secondary | ICD-10-CM | POA: Diagnosis not present

## 2021-05-15 DIAGNOSIS — D508 Other iron deficiency anemias: Secondary | ICD-10-CM

## 2021-05-15 DIAGNOSIS — D509 Iron deficiency anemia, unspecified: Secondary | ICD-10-CM | POA: Diagnosis not present

## 2021-05-15 DIAGNOSIS — Z79899 Other long term (current) drug therapy: Secondary | ICD-10-CM | POA: Diagnosis not present

## 2021-05-15 DIAGNOSIS — Z836 Family history of other diseases of the respiratory system: Secondary | ICD-10-CM | POA: Diagnosis not present

## 2021-05-15 DIAGNOSIS — R531 Weakness: Secondary | ICD-10-CM | POA: Diagnosis not present

## 2021-05-15 DIAGNOSIS — I10 Essential (primary) hypertension: Secondary | ICD-10-CM | POA: Diagnosis not present

## 2021-05-15 DIAGNOSIS — Z8249 Family history of ischemic heart disease and other diseases of the circulatory system: Secondary | ICD-10-CM | POA: Diagnosis not present

## 2021-05-15 DIAGNOSIS — Z87891 Personal history of nicotine dependence: Secondary | ICD-10-CM | POA: Diagnosis not present

## 2021-05-15 DIAGNOSIS — E039 Hypothyroidism, unspecified: Secondary | ICD-10-CM | POA: Diagnosis not present

## 2021-05-15 MED ORDER — IRON SUCROSE 20 MG/ML IV SOLN
200.0000 mg | Freq: Once | INTRAVENOUS | Status: AC
  Administered 2021-05-15: 200 mg via INTRAVENOUS
  Filled 2021-05-15: qty 10

## 2021-05-15 MED ORDER — SODIUM CHLORIDE 0.9 % IV SOLN: 200.0000 mg | Freq: Once | INTRAVENOUS | Status: DC

## 2021-05-15 MED ORDER — SODIUM CHLORIDE 0.9 % IV SOLN
Freq: Once | INTRAVENOUS | Status: AC
  Filled 2021-05-15: qty 250

## 2021-05-15 NOTE — Patient Instructions (Signed)

## 2021-05-18 ENCOUNTER — Other Ambulatory Visit: Payer: Self-pay

## 2021-05-18 ENCOUNTER — Inpatient Hospital Stay: Payer: BC Managed Care – PPO

## 2021-05-18 VITALS — BP 139/66 | HR 65 | Temp 98.8°F

## 2021-05-18 DIAGNOSIS — Z800 Family history of malignant neoplasm of digestive organs: Secondary | ICD-10-CM | POA: Diagnosis not present

## 2021-05-18 DIAGNOSIS — E039 Hypothyroidism, unspecified: Secondary | ICD-10-CM | POA: Diagnosis not present

## 2021-05-18 DIAGNOSIS — I252 Old myocardial infarction: Secondary | ICD-10-CM | POA: Diagnosis not present

## 2021-05-18 DIAGNOSIS — Z8249 Family history of ischemic heart disease and other diseases of the circulatory system: Secondary | ICD-10-CM | POA: Diagnosis not present

## 2021-05-18 DIAGNOSIS — Z87891 Personal history of nicotine dependence: Secondary | ICD-10-CM | POA: Diagnosis not present

## 2021-05-18 DIAGNOSIS — R531 Weakness: Secondary | ICD-10-CM | POA: Diagnosis not present

## 2021-05-18 DIAGNOSIS — D509 Iron deficiency anemia, unspecified: Secondary | ICD-10-CM | POA: Diagnosis not present

## 2021-05-18 DIAGNOSIS — Z79899 Other long term (current) drug therapy: Secondary | ICD-10-CM | POA: Diagnosis not present

## 2021-05-18 DIAGNOSIS — Z833 Family history of diabetes mellitus: Secondary | ICD-10-CM | POA: Diagnosis not present

## 2021-05-18 DIAGNOSIS — Z801 Family history of malignant neoplasm of trachea, bronchus and lung: Secondary | ICD-10-CM | POA: Diagnosis not present

## 2021-05-18 DIAGNOSIS — I10 Essential (primary) hypertension: Secondary | ICD-10-CM | POA: Diagnosis not present

## 2021-05-18 DIAGNOSIS — D508 Other iron deficiency anemias: Secondary | ICD-10-CM

## 2021-05-18 DIAGNOSIS — Z836 Family history of other diseases of the respiratory system: Secondary | ICD-10-CM | POA: Diagnosis not present

## 2021-05-18 DIAGNOSIS — R5383 Other fatigue: Secondary | ICD-10-CM | POA: Diagnosis not present

## 2021-05-18 MED ORDER — IRON SUCROSE 20 MG/ML IV SOLN
200.0000 mg | Freq: Once | INTRAVENOUS | Status: AC
  Administered 2021-05-18: 200 mg via INTRAVENOUS
  Filled 2021-05-18: qty 10

## 2021-05-18 MED ORDER — SODIUM CHLORIDE 0.9 % IV SOLN: 200.0000 mg | Freq: Once | INTRAVENOUS | Status: DC

## 2021-05-18 NOTE — Patient Instructions (Signed)
[facility] AT [place]  Discharge Instructions: ?Thank you for choosing [facility] to provide your oncology and hematology care.  ?If you have a lab appointment with the [facility], please go directly to the [facility] and check in at the registration area. ? ?Wear comfortable clothing and clothing appropriate for easy access to any Portacath or PICC line.  ? ?We strive to give you quality time with your provider. You may need to reschedule your appointment if you arrive late (15 or more minutes).  Arriving late affects you and other patients whose appointments are after yours.  Also, if you miss three or more appointments without notifying the office, you may be dismissed from the clinic at the provider?s discretion.    ?  ?For prescription refill requests, have your pharmacy contact our office and allow 72 hours for refills to be completed.   ? ?Today you received the following chemotherapy and/or immunotherapy agents venofer    ?  ?To help prevent nausea and vomiting after your treatment, we encourage you to take your nausea medication as directed. ? ?BELOW ARE SYMPTOMS THAT SHOULD BE REPORTED IMMEDIATELY: ?*FEVER GREATER THAN 100.4 F (38 ?C) OR HIGHER ?*CHILLS OR SWEATING ?*NAUSEA AND VOMITING THAT IS NOT CONTROLLED WITH YOUR NAUSEA MEDICATION ?*UNUSUAL SHORTNESS OF BREATH ?*UNUSUAL BRUISING OR BLEEDING ?*URINARY PROBLEMS (pain or burning when urinating, or frequent urination) ?*BOWEL PROBLEMS (unusual diarrhea, constipation, pain near the anus) ?TENDERNESS IN MOUTH AND THROAT WITH OR WITHOUT PRESENCE OF ULCERS (sore throat, sores in mouth, or a toothache) ?UNUSUAL RASH, SWELLING OR PAIN  ?UNUSUAL VAGINAL DISCHARGE OR ITCHING  ? ?Items with * indicate a potential emergency and should be followed up as soon as possible or go to the Emergency Department if any problems should occur. ? ?Please show the CHEMOTHERAPY ALERT CARD or IMMUNOTHERAPY ALERT CARD at check-in to the  Emergency Department and triage nurse. ? ?Should you have questions after your visit or need to cancel or reschedule your appointment, please contact [facility] [place] AT [place]  [phone] and follow the prompts.  Office hours are 8:00 a.m. to 4:30 p.m. Monday - Friday. Please note that voicemails left after 4:00 p.m. may not be returned until the following business day.  We are closed weekends and major holidays. You have access to a nurse at all times for urgent questions. Please call the main number to the clinic [phone] and follow the prompts. ? ?For any non-urgent questions, you may also contact your provider using MyChart. We now offer e-Visits for anyone 44 and older to request care online for non-urgent symptoms. For details visit [email or web address]. ?  ?Also download the MyChart app! Go to the app store, search "MyChart", open the app, select [facility], and log in with your MyChart username and password. ? ?Due to Covid, a mask is required upon entering the hospital/clinic. If you do not have a mask, one will be given to you upon arrival. For doctor visits, patients may have 1 support person aged 50 or older with them. For treatment visits, patients cannot have anyone with them due to current Covid guidelines and our immunocompromised population.  ? ?Iron Sucrose Injection ?What is this medication? ?IRON SUCROSE (EYE ern SOO krose) treats low levels of iron (iron deficiency anemia) in people with kidney disease. Iron is a mineral that plays an important role in making red blood cells, which carry oxygen from your lungs to the rest of your body. ?This medicine may  be used for other purposes; ask your health care provider or pharmacist if you have questions. ?COMMON BRAND NAME(S): Venofer ?What should I tell my care team before I take this medication? ?They need to know if you have any of these conditions: ?Anemia not caused by low iron levels ?Heart disease ?High levels of  iron in the blood ?Kidney disease ?Liver disease ?An unusual or allergic reaction to iron, other medications, foods, dyes, or preservatives ?Pregnant or trying to get pregnant ?Breast-feeding ?How should I use this medication? ?This medication is for infusion into a vein. It is given in a hospital or clinic setting. ?Talk to your care team about the use of this medication in children. While this medication may be prescribed for children as young as 2 years for selected conditions, precautions do apply. ?Overdosage: If you think you have taken too much of this medicine contact a poison control center or emergency room at once. ?NOTE: This medicine is only for you. Do not share this medicine with others. ?What if I miss a dose? ?It is important not to miss your dose. Call your care team if you are unable to keep an appointment. ?What may interact with this medication? ?Do not take this medication with any of the following: ?Deferoxamine ?Dimercaprol ?Other iron products ?This medication may also interact with the following: ?Chloramphenicol ?Deferasirox ?This list may not describe all possible interactions. Give your health care provider a list of all the medicines, herbs, non-prescription drugs, or dietary supplements you use. Also tell them if you smoke, drink alcohol, or use illegal drugs. Some items may interact with your medicine. ?What should I watch for while using this medication? ?Visit your care team regularly. Tell your care team if your symptoms do not start to get better or if they get worse. You may need blood work done while you are taking this medication. ?You may need to follow a special diet. Talk to your care team. Foods that contain iron include: whole grains/cereals, dried fruits, beans, or peas, leafy green vegetables, and organ meats (liver, kidney). ?What side effects may I notice from receiving this medication? ?Side effects that you should report to your care team as soon as  possible: ?Allergic reactions--skin rash, itching, hives, swelling of the face, lips, tongue, or throat ?Low blood pressure--dizziness, feeling faint or lightheaded, blurry vision ?Shortness of breath ?Side effects that usually do not require medical attention (report to your care team if they continue or are bothersome): ?Flushing ?Headache ?Joint pain ?Muscle pain ?Nausea ?Pain, redness, or irritation at injection site ?This list may not describe all possible side effects. Call your doctor for medical advice about side effects. You may report side effects to FDA at 1-800-FDA-1088. ?Where should I keep my medication? ?This medication is given in a hospital or clinic and will not be stored at home. ?NOTE: This sheet is a summary. It may not cover all possible information. If you have questions about this medicine, talk to your doctor, pharmacist, or health care provider. ?? 2022 Elsevier/Gold Standard (2020-07-04 00:00:00) ? ?

## 2021-05-21 ENCOUNTER — Inpatient Hospital Stay: Payer: BC Managed Care – PPO

## 2021-05-21 VITALS — BP 128/64 | HR 67 | Temp 98.2°F | Resp 20

## 2021-05-21 DIAGNOSIS — Z800 Family history of malignant neoplasm of digestive organs: Secondary | ICD-10-CM | POA: Diagnosis not present

## 2021-05-21 DIAGNOSIS — R5383 Other fatigue: Secondary | ICD-10-CM | POA: Diagnosis not present

## 2021-05-21 DIAGNOSIS — Z833 Family history of diabetes mellitus: Secondary | ICD-10-CM | POA: Diagnosis not present

## 2021-05-21 DIAGNOSIS — R531 Weakness: Secondary | ICD-10-CM | POA: Diagnosis not present

## 2021-05-21 DIAGNOSIS — I252 Old myocardial infarction: Secondary | ICD-10-CM | POA: Diagnosis not present

## 2021-05-21 DIAGNOSIS — Z8249 Family history of ischemic heart disease and other diseases of the circulatory system: Secondary | ICD-10-CM | POA: Diagnosis not present

## 2021-05-21 DIAGNOSIS — D509 Iron deficiency anemia, unspecified: Secondary | ICD-10-CM | POA: Diagnosis not present

## 2021-05-21 DIAGNOSIS — D508 Other iron deficiency anemias: Secondary | ICD-10-CM

## 2021-05-21 DIAGNOSIS — E039 Hypothyroidism, unspecified: Secondary | ICD-10-CM | POA: Diagnosis not present

## 2021-05-21 DIAGNOSIS — Z79899 Other long term (current) drug therapy: Secondary | ICD-10-CM | POA: Diagnosis not present

## 2021-05-21 DIAGNOSIS — Z87891 Personal history of nicotine dependence: Secondary | ICD-10-CM | POA: Diagnosis not present

## 2021-05-21 DIAGNOSIS — I10 Essential (primary) hypertension: Secondary | ICD-10-CM | POA: Diagnosis not present

## 2021-05-21 DIAGNOSIS — Z836 Family history of other diseases of the respiratory system: Secondary | ICD-10-CM | POA: Diagnosis not present

## 2021-05-21 DIAGNOSIS — Z801 Family history of malignant neoplasm of trachea, bronchus and lung: Secondary | ICD-10-CM | POA: Diagnosis not present

## 2021-05-21 MED ORDER — SODIUM CHLORIDE 0.9 % IV SOLN: 200.0000 mg | Freq: Once | INTRAVENOUS | Status: DC

## 2021-05-21 MED ORDER — IRON SUCROSE 20 MG/ML IV SOLN
200.0000 mg | Freq: Once | INTRAVENOUS | Status: AC
  Administered 2021-05-21: 200 mg via INTRAVENOUS

## 2021-05-21 MED ORDER — SODIUM CHLORIDE 0.9 % IV SOLN
INTRAVENOUS | Status: DC
  Filled 2021-05-21: qty 250

## 2021-06-05 ENCOUNTER — Telehealth (INDEPENDENT_AMBULATORY_CARE_PROVIDER_SITE_OTHER): Payer: BC Managed Care – PPO | Admitting: Physician Assistant

## 2021-06-05 ENCOUNTER — Encounter: Payer: Self-pay | Admitting: Physician Assistant

## 2021-06-05 DIAGNOSIS — J209 Acute bronchitis, unspecified: Secondary | ICD-10-CM

## 2021-06-05 DIAGNOSIS — R051 Acute cough: Secondary | ICD-10-CM

## 2021-06-05 DIAGNOSIS — J45909 Unspecified asthma, uncomplicated: Secondary | ICD-10-CM

## 2021-06-05 MED ORDER — DOXYCYCLINE HYCLATE 100 MG PO TABS: 100.0000 mg | ORAL_TABLET | Freq: Two times a day (BID) | ORAL | 0 refills | Status: DC

## 2021-06-05 MED ORDER — BENZONATATE 100 MG PO CAPS: ORAL_CAPSULE | ORAL | 0 refills | Status: DC

## 2021-06-05 NOTE — Progress Notes (Signed)
[facility] ?[address] ?[place], [place] [address] ? ?Internal MEDICINE  ?Telephone Visit ? ?Patient Name: Randy Mclean Younger ? [id number]  ?[id number] ? ?Date of Service: 06/05/2021 ? ?I connected with the patient at 12:15 by telephone and verified the patients identity using two identifiers.   ?I discussed the limitations, risks, security and privacy concerns of performing an evaluation and management service by telephone and the availability of in person appointments. I also discussed with the patient that there may be a patient responsible charge related to the service.  The patient expressed understanding and agrees to proceed.   ? ?Chief Complaint  ?Patient presents with  ? Telephone Assessment  ?  [phone] for phone call  ? Telephone Screen  ? Cough  ?  Gets bronchitis often - has not tested for covid - also states that Tessalon pearls have not helped cough at all  ? Nasal Congestion  ? ? ?HPI ?Pt is here for virtual sick visit ?-Coughing for several weeks, but now also has head congestion, sinus drainage, and sore throat.  ?-Will get bronchitis this time of year. Has been using inhaler but not helping much. Not having wheezing of SOB, mainly bad cough. ?-Has been taking benadryl, nasacort spray, and cough drops. Also had a few tessalon pearls left that he has been trying but not completely helping now and is about to run out. ?-he was treated for a sinus infection about a month ago with a zpak and reports he did feel better after that but then the cough came back with these additional symptoms now. ? ?Current Medication: ?Outpatient Encounter Medications as of 06/05/2021  ?Medication Sig  ? albuterol (VENTOLIN HFA) 108 (90 Base) MCG/ACT inhaler Inhale 2 puffs into the lungs every 4 (four) hours as needed for wheezing or shortness of breath.  ? alfuzosin (UROXATRAL) 10 MG 24 hr tablet TAKE 1 TABLET(10 MG) BY MOUTH DAILY WITH BREAKFAST  ? ALPRAZolam (XANAX) 0.5 MG tablet Take 1 tablet (0.5  mg total) by mouth 3 (three) times daily as needed for anxiety.  ? amLODipine (NORVASC) 5 MG tablet Take 10 mg by mouth daily.  ? amphetamine-dextroamphetamine (ADDERALL) 20 MG tablet Take 1 tablet (20 mg total) by mouth 3 (three) times daily.  ? amphetamine-dextroamphetamine (ADDERALL) 20 MG tablet Take 1 tablet (20 mg total) by mouth 3 (three) times daily.  ? [START ON 06/30/2021] amphetamine-dextroamphetamine (ADDERALL) 20 MG tablet Take 1 tablet (20 mg total) by mouth 3 (three) times daily.  ? atorvastatin (LIPITOR) 10 MG tablet TAKE 1 TABLET BY MOUTH DAILY  ? buPROPion (WELLBUTRIN XL) 300 MG 24 hr tablet TAKE 1 TABLET BY MOUTH DAILY GENERIC EQUIVALENT FOR WELLBUTRIN XL. DUE FOR OFFICE VISIT.  ? citalopram (CELEXA) 20 MG tablet Take 1 tablet (20 mg total) by mouth daily.  ? doxycycline (VIBRA-TABS) 100 MG tablet Take 1 tablet (100 mg total) by mouth 2 (two) times daily.  ? esomeprazole (NEXIUM) 40 MG capsule TK ONE C PO  BID UTD  ? l-methylfolate-B6-B12 (METANX) 3-35-2 MG TABS Take 1 tablet by mouth 2 (two) times daily.  ? levothyroxine (SYNTHROID) 150 MCG tablet TAKE 1 TABLET BY MOUTH DAILY BEFORE BREAKFAST  ? magnesium gluconate (MAGONATE) 500 MG tablet Take 500 mg by mouth daily.  ? meloxicam (MOBIC) 7.5 MG tablet TAKE 1 TABLET BY MOUTH 2 TIMES DAILY  ? methocarbamol (ROBAXIN) 750 MG tablet Take 750 mg by mouth 4 (four) times daily as needed.  ? mometasone-formoterol (DULERA) 100-5 MCG/ACT  AERO INHALE 2 PUFFS BY MOUTH INTO THE LUNGS EVERY 12 HOURS  ? MOVANTIK 25 MG TABS tablet Take 25 mg by mouth daily.  ? Multiple Vitamin (MULTI-VITAMINS) TABS Take by mouth.  ? oxyCODONE-acetaminophen (PERCOCET) 7.5-325 MG per tablet Take 1 tablet by mouth every 4 (four) hours as needed for pain.   ? pregabalin (LYRICA) 100 MG capsule Take 100 mg by mouth every 8 (eight) hours.  ? SYRINGE-NEEDLE, DISP, 3 ML 22G X 1" 3 ML MISC Use as directed.  ? testosterone cypionate (DEPOTESTOSTERONE CYPIONATE) 200 MG/ML injection INJECT  0.75 MLS INTO THE MUSCLE EVERY 7 DAYS AS DIRECTED  ? tiZANidine (ZANAFLEX) 4 MG tablet Take 4 mg by mouth every 6 (six) hours as needed for muscle spasms. Pt takes one tablet at night  ? valsartan (DIOVAN) 160 MG tablet Take 160 mg by mouth daily.  ? [DISCONTINUED] benzonatate (TESSALON) 100 MG capsule TAKE 1 CAPSULE(100 MG) BY MOUTH TWICE DAILY AS NEEDED FOR COUGH  ? benzonatate (TESSALON) 100 MG capsule TAKE 1 CAPSULE(100 MG) BY MOUTH TWICE DAILY AS NEEDED FOR COUGH  ? hydrochlorothiazide (HYDRODIURIL) 12.5 MG tablet Take by mouth.  ? pregabalin (LYRICA) 50 MG capsule Take 100 mg by mouth 3 (three) times daily.   ? ?No facility-administered encounter medications on file as of 06/05/2021.  ? ? ?Surgical History: ?Past Surgical History:  ?Procedure Laterality Date  ? ANTERIOR CERVICAL DISCECTOMY    ? APPENDECTOMY    ? BACK SURGERY    ? BACK SURGERY  12/04/2020  ? COLONOSCOPY  09/2013  ? elbow surgery    ? HEMORRHOID SURGERY    ? LAPAROSCOPIC APPENDECTOMY N/A 12/04/2017  ? Procedure: APPENDECTOMY LAPAROSCOPIC;  Surgeon: Vickie Epley, MD;  Location: [facility] ORS;  Service: General;  Laterality: N/A;  ? LIPOMA EXCISION Right   ? leg  ? 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    ? SPINAL CORD STIMULATOR IMPLANT    ? UPPER GI ENDOSCOPY  09/2013  ? WRIST SURGERY Left 01/14/2021  ? ? ?Medical History: ?Past Medical History:  ?Diagnosis Date  ? Acute appendicitis 12/04/2017  ? Allergy   ? takes allergy shots  ? Anemia   ? Arthritis   ? Asthma   ? Barrett esophagus   ? Bronchitis   ? Chronic pain   ? Colon polyp   ? Depression   ? GERD (gastroesophageal reflux disease)   ? Hemorrhoid   ? Hyperlipidemia   ? Hypertension   ? Hypothyroid   ? IBS (irritable bowel syndrome)   ? Kidney stone   ? MI (myocardial infarction) ([place])   ? between 2002 and 2006  ? Migraine   ? Pneumonia   ? Seizures ([place])   ? Sinus problem   ? Spinal cord stimulator status   ? ? ?Family History: ?Family History  ?Problem Relation Age of Onset  ?  Hypertension Mother   ? Lung disease Mother   ? Heart attack Father   ? Diabetes Father   ? Liver cancer Sister   ? Lung cancer Sister   ? ? ?Social History  ? ?Socioeconomic History  ? Marital status: Married  ?  Spouse name: Not on file  ? Number of children: Not on file  ? Years of education: Not on file  ? Highest education level: Not on file  ?Occupational History  ? Not on file  ?Tobacco Use  ? Smoking status: Former  ?  Packs/day: 1.00  ?  Years: 30.00  ?  Pack years: 30.00  ?  Types: Cigarettes  ?  Quit date: 02/22/2005  ?  Years since quitting: 16.2  ? Smokeless tobacco: Never  ?Vaping Use  ? Vaping Use: Never used  ?Substance and Sexual Activity  ? Alcohol use: Yes  ?  Alcohol/week: 0.0 standard drinks  ?  Comment: occasionally  ? Drug use: No  ?  Comment: past  ? Sexual activity: Yes  ?Other Topics Concern  ? Not on file  ?Social History Narrative  ? Not on file  ? ?Social Determinants of Health  ? ?Financial Resource Strain: Not on file  ?Food Insecurity: Not on file  ?Transportation Needs: Not on file  ?Physical Activity: Not on file  ?Stress: Not on file  ?Social Connections: Not on file  ?Intimate Partner Violence: Not on file  ? ? ? ? ?Review of Systems  ?Constitutional:  Negative for fatigue and fever.  ?HENT:  Positive for congestion, postnasal drip, sinus pressure and sore throat. Negative for mouth sores.   ?Respiratory:  Positive for cough. Negative for shortness of breath and wheezing.   ?Cardiovascular:  Negative for chest pain.  ?Genitourinary:  Negative for flank pain.  ?Psychiatric/Behavioral: Negative.    ? ?Vital Signs: ?There were no vitals taken for this visit. ? ? ?Observation/Objective: ? ?Pt is able to carry out conversation ? ? ?Assessment/Plan: ?1. Acute bronchitis with asthma ?Will start doxycycline and continue nasal spray and inhalers as prescribed and as indicated. Will rest and stay well hydrated and may try mucinex for congestion as well. ?- doxycycline (VIBRA-TABS) 100 MG  tablet; Take 1 tablet (100 mg total) by mouth 2 (two) times daily.  Dispense: 20 tablet; Refill: 0 ? ?2. Acute cough ?Start doxycycline and continue tessalon pearls as needed for cough.  ?- doxycycline (VIBRA-TABS)

## 2021-06-16 DIAGNOSIS — M5417 Radiculopathy, lumbosacral region: Secondary | ICD-10-CM | POA: Diagnosis not present

## 2021-06-16 DIAGNOSIS — Z79891 Long term (current) use of opiate analgesic: Secondary | ICD-10-CM | POA: Diagnosis not present

## 2021-06-16 DIAGNOSIS — M961 Postlaminectomy syndrome, not elsewhere classified: Secondary | ICD-10-CM | POA: Diagnosis not present

## 2021-06-16 DIAGNOSIS — G894 Chronic pain syndrome: Secondary | ICD-10-CM | POA: Diagnosis not present

## 2021-06-19 ENCOUNTER — Ambulatory Visit
Admission: RE | Admit: 2021-06-19 | Discharge: 2021-06-19 | Disposition: A | Discharge status: Home / Self Care | Payer: BC Managed Care – PPO | Source: Ambulatory Visit | Attending: Nurse Practitioner | Admitting: Nurse Practitioner

## 2021-06-19 ENCOUNTER — Telehealth: Payer: BC Managed Care – PPO | Admitting: Nurse Practitioner

## 2021-06-19 ENCOUNTER — Encounter: Payer: Self-pay | Admitting: Nurse Practitioner

## 2021-06-19 ENCOUNTER — Ambulatory Visit
Admission: RE | Admit: 2021-06-19 | Discharge: 2021-06-19 | Disposition: A | Discharge status: Home / Self Care | Payer: BC Managed Care – PPO | Attending: Nurse Practitioner | Admitting: Nurse Practitioner

## 2021-06-19 VITALS — BP 119/68 | Ht 72.0 in | Wt 207.0 lb

## 2021-06-19 DIAGNOSIS — J45909 Unspecified asthma, uncomplicated: Secondary | ICD-10-CM

## 2021-06-19 DIAGNOSIS — J209 Acute bronchitis, unspecified: Secondary | ICD-10-CM

## 2021-06-19 DIAGNOSIS — J0111 Acute recurrent frontal sinusitis: Secondary | ICD-10-CM

## 2021-06-19 DIAGNOSIS — R3589 Other polyuria: Secondary | ICD-10-CM

## 2021-06-19 DIAGNOSIS — R059 Cough, unspecified: Secondary | ICD-10-CM | POA: Diagnosis not present

## 2021-06-19 DIAGNOSIS — D509 Iron deficiency anemia, unspecified: Secondary | ICD-10-CM

## 2021-06-19 DIAGNOSIS — R053 Chronic cough: Secondary | ICD-10-CM

## 2021-06-19 DIAGNOSIS — E039 Hypothyroidism, unspecified: Secondary | ICD-10-CM

## 2021-06-19 MED ORDER — LEVOFLOXACIN 750 MG PO TABS: 750.0000 mg | ORAL_TABLET | Freq: Every day | ORAL | 0 refills | Status: AC

## 2021-06-19 NOTE — Progress Notes (Signed)
[facility] ?[address] ?[place], [place] [address] ? ?Internal MEDICINE  ?Telephone Visit ? ?Patient Name: Randy Mclean Younger ? [id number]  ?[id number] ? ?Date of Service: 06/19/2021 ? ?I connected with the patient at 10:40 AM by telephone and verified the patients identity using two identifiers.   ?I discussed the limitations, risks, security and privacy concerns of performing an evaluation and management service by telephone and the availability of in person appointments. I also discussed with the patient that there may be a patient responsible charge related to the service.  The patient expressed understanding and agrees to proceed.   ? ?Chief Complaint  ?Patient presents with  ? Telephone Assessment  ?  [id number]  ? Telephone Screen  ?  Like to check for Diabetic   ? Sinusitis  ? Cough  ? Wheezing  ? ? ?HPI ?Brandn presents for a telehealth virtual visit for persistent cough for approximately 3 months.  He has had sinusitis and has been treated multiple times.  He had 2 courses of doxycycline and a Z-Pak.  He is allergic to amoxicillin and penicillin.  He continues to have the cough with additional symptoms to include wheezing and chest tightness as well as nasal congestion continued sinus pressure and sore throat.  He is worried he could be developing pneumonia and would like to have a chest x-ray done.  He is going on a church retreat tomorrow and wants to ensure he is not contagious. ?Patient has also been dealing with exceptional amount of stress related to his health and the health of his family members as well as a recent death in the family.  He has felt fatigued and drained and just not himself.  He also reports that he has been having to urinate much more frequently than usual but does not have typical signs of a UTI.  He also reports that he has a strong family history of diabetes.  He is wanting to be tested for diabetes.  He also has a history of iron deficiency anemia and sees  hematology routinely. ? ? ?Current Medication: ?Outpatient Encounter Medications as of 06/19/2021  ?Medication Sig  ? albuterol (VENTOLIN HFA) 108 (90 Base) MCG/ACT inhaler Inhale 2 puffs into the lungs every 4 (four) hours as needed for wheezing or shortness of breath.  ? alfuzosin (UROXATRAL) 10 MG 24 hr tablet TAKE 1 TABLET(10 MG) BY MOUTH DAILY WITH BREAKFAST  ? ALPRAZolam (XANAX) 0.5 MG tablet Take 1 tablet (0.5 mg total) by mouth 3 (three) times daily as needed for anxiety.  ? amLODipine (NORVASC) 5 MG tablet Take 10 mg by mouth daily.  ? amphetamine-dextroamphetamine (ADDERALL) 20 MG tablet Take 1 tablet (20 mg total) by mouth 3 (three) times daily.  ? amphetamine-dextroamphetamine (ADDERALL) 20 MG tablet Take 1 tablet (20 mg total) by mouth 3 (three) times daily.  ? [START ON 06/30/2021] amphetamine-dextroamphetamine (ADDERALL) 20 MG tablet Take 1 tablet (20 mg total) by mouth 3 (three) times daily.  ? atorvastatin (LIPITOR) 10 MG tablet TAKE 1 TABLET BY MOUTH DAILY  ? benzonatate (TESSALON) 100 MG capsule TAKE 1 CAPSULE(100 MG) BY MOUTH TWICE DAILY AS NEEDED FOR COUGH  ? buPROPion (WELLBUTRIN XL) 300 MG 24 hr tablet TAKE 1 TABLET BY MOUTH DAILY GENERIC EQUIVALENT FOR WELLBUTRIN XL. DUE FOR OFFICE VISIT.  ? citalopram (CELEXA) 20 MG tablet Take 1 tablet (20 mg total) by mouth daily.  ? esomeprazole (NEXIUM) 40 MG capsule TK ONE C PO  BID UTD  ?  l-methylfolate-B6-B12 (METANX) 3-35-2 MG TABS Take 1 tablet by mouth 2 (two) times daily.  ? levofloxacin (LEVAQUIN) 750 MG tablet Take 1 tablet (750 mg total) by mouth daily for 7 days.  ? levothyroxine (SYNTHROID) 150 MCG tablet TAKE 1 TABLET BY MOUTH DAILY BEFORE BREAKFAST  ? magnesium gluconate (MAGONATE) 500 MG tablet Take 500 mg by mouth daily.  ? meloxicam (MOBIC) 7.5 MG tablet TAKE 1 TABLET BY MOUTH 2 TIMES DAILY  ? methocarbamol (ROBAXIN) 750 MG tablet Take 750 mg by mouth 4 (four) times daily as needed.  ? mometasone-formoterol (DULERA) 100-5 MCG/ACT AERO  INHALE 2 PUFFS BY MOUTH INTO THE LUNGS EVERY 12 HOURS  ? MOVANTIK 25 MG TABS tablet Take 25 mg by mouth daily.  ? Multiple Vitamin (MULTI-VITAMINS) TABS Take by mouth.  ? oxyCODONE-acetaminophen (PERCOCET) 7.5-325 MG per tablet Take 1 tablet by mouth every 4 (four) hours as needed for pain.   ? pregabalin (LYRICA) 100 MG capsule Take 100 mg by mouth every 8 (eight) hours.  ? SYRINGE-NEEDLE, DISP, 3 ML 22G X 1" 3 ML MISC Use as directed.  ? testosterone cypionate (DEPOTESTOSTERONE CYPIONATE) 200 MG/ML injection INJECT 0.75 MLS INTO THE MUSCLE EVERY 7 DAYS AS DIRECTED  ? tiZANidine (ZANAFLEX) 4 MG tablet Take 4 mg by mouth every 6 (six) hours as needed for muscle spasms. Pt takes one tablet at night  ? valsartan (DIOVAN) 160 MG tablet Take 160 mg by mouth daily.  ? [DISCONTINUED] doxycycline (VIBRA-TABS) 100 MG tablet Take 1 tablet (100 mg total) by mouth 2 (two) times daily.  ? hydrochlorothiazide (HYDRODIURIL) 12.5 MG tablet Take by mouth.  ? [DISCONTINUED] pregabalin (LYRICA) 50 MG capsule Take 100 mg by mouth 3 (three) times daily.   ? ?No facility-administered encounter medications on file as of 06/19/2021.  ? ? ?Surgical History: ?Past Surgical History:  ?Procedure Laterality Date  ? ANTERIOR CERVICAL DISCECTOMY    ? APPENDECTOMY    ? BACK SURGERY    ? BACK SURGERY  12/04/2020  ? COLONOSCOPY  09/2013  ? elbow surgery    ? HEMORRHOID SURGERY    ? LAPAROSCOPIC APPENDECTOMY N/A 12/04/2017  ? Procedure: APPENDECTOMY LAPAROSCOPIC;  Surgeon: Vickie Epley, MD;  Location: [facility] ORS;  Service: General;  Laterality: N/A;  ? LIPOMA EXCISION Right   ? leg  ? 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    ? SPINAL CORD STIMULATOR IMPLANT    ? UPPER GI ENDOSCOPY  09/2013  ? WRIST SURGERY Left 01/14/2021  ? ? ?Medical History: ?Past Medical History:  ?Diagnosis Date  ? Acute appendicitis 12/04/2017  ? Allergy   ? takes allergy shots  ? Anemia   ? Arthritis   ? Asthma   ? Barrett esophagus   ? Bronchitis   ? Chronic pain   ?  Colon polyp   ? Depression   ? GERD (gastroesophageal reflux disease)   ? Hemorrhoid   ? Hyperlipidemia   ? Hypertension   ? Hypothyroid   ? IBS (irritable bowel syndrome)   ? Kidney stone   ? MI (myocardial infarction) ([place])   ? between 2002 and 2006  ? Migraine   ? Pneumonia   ? Seizures ([place])   ? Sinus problem   ? Spinal cord stimulator status   ? ? ?Family History: ?Family History  ?Problem Relation Age of Onset  ? Hypertension Mother   ? Lung disease Mother   ? Heart attack Father   ? Diabetes Father   ? Liver  cancer Sister   ? Lung cancer Sister   ? ? ?Social History  ? ?Socioeconomic History  ? Marital status: Married  ?  Spouse name: Not on file  ? Number of children: Not on file  ? Years of education: Not on file  ? Highest education level: Not on file  ?Occupational History  ? Not on file  ?Tobacco Use  ? Smoking status: Former  ?  Packs/day: 1.00  ?  Years: 30.00  ?  Pack years: 30.00  ?  Types: Cigarettes  ?  Quit date: 02/22/2005  ?  Years since quitting: 16.3  ? Smokeless tobacco: Never  ?Vaping Use  ? Vaping Use: Never used  ?Substance and Sexual Activity  ? Alcohol use: Yes  ?  Alcohol/week: 0.0 standard drinks  ?  Comment: occasionally  ? Drug use: No  ?  Comment: past  ? Sexual activity: Yes  ?Other Topics Concern  ? Not on file  ?Social History Narrative  ? Not on file  ? ?Social Determinants of Health  ? ?Financial Resource Strain: Not on file  ?Food Insecurity: Not on file  ?Transportation Needs: Not on file  ?Physical Activity: Not on file  ?Stress: Not on file  ?Social Connections: Not on file  ?Intimate Partner Violence: Not on file  ? ? ? ? ?Review of Systems  ?Constitutional:  Negative for fatigue and fever.  ?HENT:  Positive for congestion, postnasal drip, sinus pressure and sore throat. Negative for mouth sores.   ?Respiratory:  Positive for cough and wheezing. Negative for shortness of breath.   ?Cardiovascular:  Negative for chest pain.  ?Genitourinary:  Negative for flank pain.   ?Psychiatric/Behavioral: Negative.    ? ?Vital Signs: ?BP 119/68   Ht 6' (1.829 m)   Wt 207 lb (93.9 kg)   BMI 28.07 kg/m?  ? ? ?Observation/Objective: ?He is alert and oriented and engages in conversation appropriatel

## 2021-06-22 ENCOUNTER — Telehealth: Payer: Self-pay

## 2021-06-22 NOTE — Progress Notes (Signed)
Please call patient and let him know that there are no signs of pneumonia, pulmonary edema or any other acute abnormalities that could be causing his cough.  He does have mild chronic peribronchial thickening which is consistent with his chronic lung issues.  It would be a good idea to do a pulmonary function test to see if his breathing has worsened and that worsening asthma or COPD could be the reason behind this persistent cough.  If he is agreeable, let me know and I will order the PFT.

## 2021-06-22 NOTE — Telephone Encounter (Signed)
-----   Message from Jonetta Osgood, NP sent at 06/22/2021  8:21 AM EDT ----- ?Please call patient and let him know that there are no signs of pneumonia, pulmonary edema or any other acute abnormalities that could be causing his cough.  He does have mild chronic peribronchial thickening which is consistent with his chronic lung issues.  It would be a good idea to do a pulmonary function test to see if his breathing has worsened and that worsening asthma or COPD could be the reason behind this persistent cough.  If he is agreeable, let me know and I will order the PFT. ?

## 2021-06-23 DIAGNOSIS — E039 Hypothyroidism, unspecified: Secondary | ICD-10-CM | POA: Diagnosis not present

## 2021-06-23 DIAGNOSIS — R3589 Other polyuria: Secondary | ICD-10-CM | POA: Diagnosis not present

## 2021-06-23 DIAGNOSIS — D509 Iron deficiency anemia, unspecified: Secondary | ICD-10-CM | POA: Diagnosis not present

## 2021-06-24 LAB — TSH+FREE T4
Free T4: 1.19 ng/dL (ref 0.82–1.77)
TSH: 1.48 u[IU]/mL (ref 0.450–4.500)

## 2021-06-24 LAB — CBC WITH DIFFERENTIAL/PLATELET
Basophils Absolute: 0.1 10*3/uL (ref 0.0–0.2)
Basos: 1 %
EOS (ABSOLUTE): 0.2 10*3/uL (ref 0.0–0.4)
Eos: 2 %
Hematocrit: 36.2 % — ABNORMAL LOW (ref 37.5–51.0)
Hemoglobin: 10.9 g/dL — ABNORMAL LOW (ref 13.0–17.7)
Immature Grans (Abs): 0 10*3/uL (ref 0.0–0.1)
Immature Granulocytes: 0 %
Lymphocytes Absolute: 1.4 10*3/uL (ref 0.7–3.1)
Lymphs: 19 %
MCH: 24.2 pg — ABNORMAL LOW (ref 26.6–33.0)
MCHC: 30.1 g/dL — ABNORMAL LOW (ref 31.5–35.7)
MCV: 80 fL (ref 79–97)
Monocytes Absolute: 0.7 10*3/uL (ref 0.1–0.9)
Monocytes: 9 %
Neutrophils Absolute: 5.3 10*3/uL (ref 1.4–7.0)
Neutrophils: 69 %
Platelets: 316 10*3/uL (ref 150–450)
RBC: 4.51 x10E6/uL (ref 4.14–5.80)
RDW: 17.3 % — ABNORMAL HIGH (ref 11.6–15.4)
WBC: 7.6 10*3/uL (ref 3.4–10.8)

## 2021-06-24 LAB — CMP14+EGFR
ALT: 15 IU/L (ref 0–44)
AST: 16 IU/L (ref 0–40)
Albumin/Globulin Ratio: 2.1 (ref 1.2–2.2)
Albumin: 4.4 g/dL (ref 3.8–4.9)
Alkaline Phosphatase: 78 IU/L (ref 44–121)
BUN/Creatinine Ratio: 14 (ref 9–20)
BUN: 16 mg/dL (ref 6–24)
Bilirubin Total: <0.2 mg/dL (ref 0.0–1.2)
CO2: 28 mmol/L (ref 20–29)
Calcium: 9 mg/dL (ref 8.7–10.2)
Chloride: 102 mmol/L (ref 96–106)
Creatinine, Ser: 1.18 mg/dL (ref 0.76–1.27)
Globulin, Total: 2.1 g/dL (ref 1.5–4.5)
Glucose: 96 mg/dL (ref 70–99)
Potassium: 3.9 mmol/L (ref 3.5–5.2)
Sodium: 143 mmol/L (ref 134–144)
Total Protein: 6.5 g/dL (ref 6.0–8.5)
eGFR: 72 mL/min/{1.73_m2} (ref >59)

## 2021-06-24 LAB — HGB A1C W/O EAG: Hgb A1c MFr Bld: 5 % (ref 4.8–5.6)

## 2021-07-09 ENCOUNTER — Encounter: Payer: Self-pay | Admitting: Nurse Practitioner

## 2021-07-09 ENCOUNTER — Ambulatory Visit (INDEPENDENT_AMBULATORY_CARE_PROVIDER_SITE_OTHER): Payer: BC Managed Care – PPO | Admitting: Nurse Practitioner

## 2021-07-09 VITALS — BP 140/76 | HR 82 | Temp 98.1°F | Resp 16 | Ht 72.0 in | Wt 207.0 lb

## 2021-07-09 DIAGNOSIS — E559 Vitamin D deficiency, unspecified: Secondary | ICD-10-CM | POA: Diagnosis not present

## 2021-07-09 DIAGNOSIS — E538 Deficiency of other specified B group vitamins: Secondary | ICD-10-CM | POA: Diagnosis not present

## 2021-07-09 DIAGNOSIS — R5383 Other fatigue: Secondary | ICD-10-CM

## 2021-07-09 DIAGNOSIS — R053 Chronic cough: Secondary | ICD-10-CM

## 2021-07-09 NOTE — Progress Notes (Signed)
[facility] [place], [place] [address]  Internal MEDICINE  Office Visit Note  Patient Name: Randy Mclean  [id number]  [id number]  Date of Service: 07/09/2021  Chief Complaint  Patient presents with   Follow-up   Fatigue    HPI Randy Mclean presents for follow-up visit for fatigue and recent upper respiratory infection.  Patient is also lost 5 pounds since his previous office visit.  He is worried that his vitamin B12 or vitamin D may be low and wants to get them checked.  Patient reports that the cough that he was having previously is starting to improve.   Current Medication: Outpatient Encounter Medications as of 07/09/2021  Medication Sig   albuterol (VENTOLIN HFA) 108 (90 Base) MCG/ACT inhaler Inhale 2 puffs into the lungs every 4 (four) hours as needed for wheezing or shortness of breath.   alfuzosin (UROXATRAL) 10 MG 24 hr tablet TAKE 1 TABLET(10 MG) BY MOUTH DAILY WITH BREAKFAST   amLODipine (NORVASC) 5 MG tablet Take 10 mg by mouth daily.   amphetamine-dextroamphetamine (ADDERALL) 20 MG tablet Take 1 tablet (20 mg total) by mouth 3 (three) times daily.   amphetamine-dextroamphetamine (ADDERALL) 20 MG tablet Take 1 tablet (20 mg total) by mouth 3 (three) times daily.   esomeprazole (NEXIUM) 40 MG capsule TK ONE C PO  BID UTD   levothyroxine (SYNTHROID) 150 MCG tablet TAKE 1 TABLET BY MOUTH DAILY BEFORE BREAKFAST   magnesium gluconate (MAGONATE) 500 MG tablet Take 500 mg by mouth daily.   meloxicam (MOBIC) 7.5 MG tablet TAKE 1 TABLET BY MOUTH 2 TIMES DAILY   methocarbamol (ROBAXIN) 750 MG tablet Take 750 mg by mouth 4 (four) times daily as needed.   Multiple Vitamin (MULTI-VITAMINS) TABS Take by mouth.   pregabalin (LYRICA) 100 MG capsule Take 100 mg by mouth every 8 (eight) hours.   SYRINGE-NEEDLE, DISP, 3 ML 22G X 1" 3 ML MISC Use as directed.   testosterone cypionate (DEPOTESTOSTERONE CYPIONATE) 200 MG/ML injection INJECT 0.75 MLS INTO THE  MUSCLE EVERY 7 DAYS AS DIRECTED   tiZANidine (ZANAFLEX) 4 MG tablet Take 4 mg by mouth every 6 (six) hours as needed for muscle spasms. Pt takes one tablet at night   valsartan (DIOVAN) 160 MG tablet Take 160 mg by mouth daily.   [DISCONTINUED] ALPRAZolam (XANAX) 0.5 MG tablet Take 1 tablet (0.5 mg total) by mouth 3 (three) times daily as needed for anxiety.   [DISCONTINUED] amphetamine-dextroamphetamine (ADDERALL) 20 MG tablet Take 1 tablet (20 mg total) by mouth 3 (three) times daily.   [DISCONTINUED] atorvastatin (LIPITOR) 10 MG tablet TAKE 1 TABLET BY MOUTH DAILY   [DISCONTINUED] buPROPion (WELLBUTRIN XL) 300 MG 24 hr tablet TAKE 1 TABLET BY MOUTH DAILY GENERIC EQUIVALENT FOR WELLBUTRIN XL. DUE FOR OFFICE VISIT.   [DISCONTINUED] citalopram (CELEXA) 20 MG tablet Take 1 tablet (20 mg total) by mouth daily.   [DISCONTINUED] mometasone-formoterol (DULERA) 100-5 MCG/ACT AERO INHALE 2 PUFFS BY MOUTH INTO THE LUNGS EVERY 12 HOURS   hydrochlorothiazide (HYDRODIURIL) 12.5 MG tablet Take by mouth.   [DISCONTINUED] benzonatate (TESSALON) 100 MG capsule TAKE 1 CAPSULE(100 MG) BY MOUTH TWICE DAILY AS NEEDED FOR COUGH (Patient not taking: Reported on 07/09/2021)   [DISCONTINUED] l-methylfolate-B6-B12 (METANX) 3-35-2 MG TABS Take 1 tablet by mouth 2 (two) times daily. (Patient not taking: Reported on 07/09/2021)   [DISCONTINUED] MOVANTIK 25 MG TABS tablet Take 25 mg by mouth daily. (Patient not taking: Reported on 07/09/2021)   [DISCONTINUED] oxyCODONE-acetaminophen (PERCOCET)  7.5-325 MG per tablet Take 1 tablet by mouth every 4 (four) hours as needed for pain.  (Patient not taking: Reported on 07/09/2021)   No facility-administered encounter medications on file as of 07/09/2021.    Surgical History: Past Surgical History:  Procedure Laterality Date   ANTERIOR CERVICAL DISCECTOMY     APPENDECTOMY     BACK SURGERY     BACK SURGERY  12/04/2020   COLONOSCOPY  09/2013   elbow surgery     HEMORRHOID SURGERY      LAPAROSCOPIC APPENDECTOMY N/A 12/04/2017   Procedure: APPENDECTOMY LAPAROSCOPIC;  Surgeon: Randy Epley, MD;  Location: [facility] ORS;  Service: General;  Laterality: N/A;   LIPOMA EXCISION Right    leg   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     SPINAL CORD STIMULATOR IMPLANT     UPPER GI ENDOSCOPY  09/2013   WRIST SURGERY Left 01/14/2021    Medical History: Past Medical History:  Diagnosis Date   Acute appendicitis 12/04/2017   Allergy    takes allergy shots   Anemia    Arthritis    Asthma    Barrett esophagus    Bronchitis    Chronic pain    Colon polyp    Depression    GERD (gastroesophageal reflux disease)    Hemorrhoid    Hyperlipidemia    Hypertension    Hypothyroid    IBS (irritable bowel syndrome)    Kidney stone    MI (myocardial infarction) ([place])    between 2002 and 2006   Migraine    Pneumonia    Seizures ([place])    Sinus problem    Spinal cord stimulator status     Family History: Family History  Problem Relation Age of Onset   Hypertension Mother    Lung disease Mother    Heart attack Father    Diabetes Father    Liver cancer Sister    Lung cancer Sister    Colon cancer Sister    Uterine cancer Sister     Social History   Socioeconomic History   Marital status: Married    Spouse name: Not on file   Number of children: Not on file   Years of education: Not on file   Highest education level: Not on file  Occupational History   Not on file  Tobacco Use   Smoking status: Former    Packs/day: 1.00    Years: 30.00    Total pack years: 30.00    Types: Cigarettes    Quit date: 02/22/2005    Years since quitting: 16.5   Smokeless tobacco: Never  Vaping Use   Vaping Use: Never used  Substance and Sexual Activity   Alcohol use: Yes    Alcohol/week: 0.0 standard drinks of alcohol    Comment: occasionally   Drug use: No    Comment: past   Sexual activity: Yes  Other Topics Concern   Not on file  Social History Narrative   Not on  file   Social Determinants of Health   Financial Resource Strain: Not on file  Food Insecurity: Not on file  Transportation Needs: Not on file  Physical Activity: Not on file  Stress: Not on file  Social Connections: Not on file  Intimate Partner Violence: Not on file      Review of Systems  Constitutional:  Positive for fatigue. Negative for chills and unexpected weight change.  HENT:  Negative for congestion, rhinorrhea, sneezing and sore throat.  Respiratory:  Positive for cough (Improving). Negative for chest tightness, shortness of breath and wheezing.   Cardiovascular: Negative.  Negative for chest pain and palpitations.  Gastrointestinal:  Negative for abdominal pain, constipation, diarrhea, nausea and vomiting.  Musculoskeletal:  Positive for arthralgias. Negative for back pain, joint swelling and neck pain.  Neurological:  Positive for dizziness. Negative for tremors and numbness.  Hematological:  Negative for adenopathy. Does not bruise/bleed easily.  Psychiatric/Behavioral:  Positive for decreased concentration and sleep disturbance. Negative for behavioral problems (Depression), self-injury and suicidal ideas. The patient is nervous/anxious.     Vital Signs: BP 140/76   Pulse 82   Temp 98.1 F (36.7 C)   Resp 16   Ht 6' (1.829 m)   Wt 207 lb (93.9 kg)   SpO2 98%   BMI 28.07 kg/m    Physical Exam Vitals reviewed.  Constitutional:      General: He is not in acute distress.    Appearance: Normal appearance. He is not ill-appearing.  HENT:     Head: Normocephalic and atraumatic.  Eyes:     Pupils: Pupils are equal, round, and reactive to light.  Cardiovascular:     Rate and Rhythm: Normal rate and regular rhythm.  Pulmonary:     Effort: Pulmonary effort is normal. No respiratory distress.  Neurological:     Mental Status: He is alert and oriented to person, place, and time.  Psychiatric:        Mood and Affect: Mood normal.        Behavior: Behavior  normal.        Assessment/Plan: 1. Other fatigue Patient has been feeling fatigued and wants his vitamin levels checked to see if they are low and that is the reason why he is feeling so tired.  He also has narcolepsy and is not on the best choice medication for keeping him awake during the day modafinil always worked better but his insurance was not covering it so he is on Adderall.  2. Vitamin D deficiency Vitamin D level ordered - Vitamin D (25 hydroxy)  3. B12 deficiency B12 and folate level ordered - B12 and Folate Panel  4. Persistent cough for 3 weeks or longer Per patient report the persistent cough he has been having is finally starting to improve and resolve   General Counseling: Sherif verbalizes understanding of the findings of todays visit and agrees with plan of treatment. I have discussed any further diagnostic evaluation that may be needed or ordered today. We also reviewed his medications today. he has been encouraged to call the office with any questions or concerns that should arise related to todays visit.    Orders Placed This Encounter  Procedures   B12 and Folate Panel   Vitamin D (25 hydroxy)    No orders of the defined types were placed in this encounter.   Return in about 4 weeks (around 08/06/2021) for F/U, Labs, Lyrah Bradt PCP.   Total time spent:30 Minutes Time spent includes review of chart, medications, test results, and follow up plan with the patient.   [place] Controlled Substance Database was reviewed by me.  This patient was seen by Jonetta Osgood, FNP-C in collaboration with Dr. Clayborn Bigness as a part of collaborative care agreement.   Zariah Jost R. Valetta Fuller, MSN, FNP-C Internal medicine

## 2021-07-15 ENCOUNTER — Encounter: Payer: Self-pay | Admitting: Oncology

## 2021-07-26 ENCOUNTER — Other Ambulatory Visit: Payer: Self-pay | Admitting: Internal Medicine

## 2021-07-26 DIAGNOSIS — F339 Major depressive disorder, recurrent, unspecified: Secondary | ICD-10-CM

## 2021-07-31 ENCOUNTER — Other Ambulatory Visit: Payer: Self-pay | Admitting: Nurse Practitioner

## 2021-08-04 ENCOUNTER — Encounter: Payer: Self-pay | Admitting: Nurse Practitioner

## 2021-08-04 ENCOUNTER — Ambulatory Visit: Payer: BC Managed Care – PPO | Admitting: Nurse Practitioner

## 2021-08-04 VITALS — BP 137/63 | HR 73 | Temp 98.9°F | Resp 16 | Ht 72.0 in | Wt 214.6 lb

## 2021-08-04 DIAGNOSIS — R5383 Other fatigue: Secondary | ICD-10-CM | POA: Diagnosis not present

## 2021-08-04 DIAGNOSIS — G47429 Narcolepsy in conditions classified elsewhere without cataplexy: Secondary | ICD-10-CM

## 2021-08-04 DIAGNOSIS — F411 Generalized anxiety disorder: Secondary | ICD-10-CM

## 2021-08-04 DIAGNOSIS — F410 Panic disorder [episodic paroxysmal anxiety] without agoraphobia: Secondary | ICD-10-CM

## 2021-08-04 DIAGNOSIS — E559 Vitamin D deficiency, unspecified: Secondary | ICD-10-CM

## 2021-08-04 DIAGNOSIS — Z79899 Other long term (current) drug therapy: Secondary | ICD-10-CM

## 2021-08-04 DIAGNOSIS — E538 Deficiency of other specified B group vitamins: Secondary | ICD-10-CM

## 2021-08-04 MED ORDER — AMPHETAMINE-DEXTROAMPHETAMINE 20 MG PO TABS: 20.0000 mg | ORAL_TABLET | Freq: Three times a day (TID) | ORAL | 0 refills | Status: DC

## 2021-08-04 MED ORDER — ALPRAZOLAM 0.5 MG PO TABS: 0.5000 mg | ORAL_TABLET | Freq: Three times a day (TID) | ORAL | 2 refills | Status: DC | PRN

## 2021-08-04 MED ORDER — MODAFINIL 200 MG PO TABS: 200.0000 mg | ORAL_TABLET | Freq: Every day | ORAL | 0 refills | Status: DC

## 2021-08-04 NOTE — Progress Notes (Signed)
[facility] [place], [place] [address]  Internal MEDICINE  Office Visit Note  Patient Name: Randy Mclean  [id number]  [id number]  Date of Service: 08/04/2021  Chief Complaint  Patient presents with   Follow-up   Depression   Gastroesophageal Reflux   Hypertension   Hyperlipidemia   Asthma   Anemia   Medication Refill    HPI Randy Mclean presents for follow-up visit for anemia, narcolepsy, hypertension depression and anxiety.  At a previous office visit he had some labs ordered for checking his B12, vitamin D and folate levels and was reminded today to have his levels checked. He was first diagnosed with narcolepsy, modafinil was a medication that worked the best to keep him awake during the day but in the past his insurance stopped covering it and he had stated that they still would not cover it.  Over time the insurance formulary change and his current insurance formulary says that modafinil is a preferred medication.  He has been using Adderall to help keep him awake during the day but he reports that it does not work as well as modafinil. His blood pressure is well controlled with current medications. He does need a refill of alprazolam as well.    Current Medication: Outpatient Encounter Medications as of 08/04/2021  Medication Sig   albuterol (VENTOLIN HFA) 108 (90 Base) MCG/ACT inhaler Inhale 2 puffs into the lungs every 4 (four) hours as needed for wheezing or shortness of breath.   alfuzosin (UROXATRAL) 10 MG 24 hr tablet TAKE 1 TABLET(10 MG) BY MOUTH DAILY WITH BREAKFAST   amLODipine (NORVASC) 5 MG tablet Take 10 mg by mouth daily.   amphetamine-dextroamphetamine (ADDERALL) 20 MG tablet Take 1 tablet (20 mg total) by mouth 3 (three) times daily.   amphetamine-dextroamphetamine (ADDERALL) 20 MG tablet Take 1 tablet (20 mg total) by mouth 3 (three) times daily.   atorvastatin (LIPITOR) 10 MG tablet TAKE 1 TABLET BY MOUTH DAILY   buPROPion  (WELLBUTRIN XL) 300 MG 24 hr tablet TAKE 1 TABLET BY MOUTH DAILY, GENERIC EQUIVALENT FOR WELLBUTRIN XL. DUE FOR OFFICE VISIT.   citalopram (CELEXA) 20 MG tablet TAKE 1 TABLET BY MOUTH DAILY   esomeprazole (NEXIUM) 40 MG capsule TK ONE C PO  BID UTD   levothyroxine (SYNTHROID) 150 MCG tablet TAKE 1 TABLET BY MOUTH DAILY BEFORE BREAKFAST   magnesium gluconate (MAGONATE) 500 MG tablet Take 500 mg by mouth daily.   meloxicam (MOBIC) 7.5 MG tablet TAKE 1 TABLET BY MOUTH 2 TIMES DAILY   methocarbamol (ROBAXIN) 750 MG tablet Take 750 mg by mouth 4 (four) times daily as needed.   modafinil (PROVIGIL) 200 MG tablet Take 1 tablet (200 mg total) by mouth daily.   Multiple Vitamin (MULTI-VITAMINS) TABS Take by mouth.   pregabalin (LYRICA) 100 MG capsule Take 100 mg by mouth every 8 (eight) hours.   SYRINGE-NEEDLE, DISP, 3 ML 22G X 1" 3 ML MISC Use as directed.   testosterone cypionate (DEPOTESTOSTERONE CYPIONATE) 200 MG/ML injection INJECT 0.75 MLS INTO THE MUSCLE EVERY 7 DAYS AS DIRECTED   tiZANidine (ZANAFLEX) 4 MG tablet Take 4 mg by mouth every 6 (six) hours as needed for muscle spasms. Pt takes one tablet at night   valsartan (DIOVAN) 160 MG tablet Take 160 mg by mouth daily.   [DISCONTINUED] ALPRAZolam (XANAX) 0.5 MG tablet Take 1 tablet (0.5 mg total) by mouth 3 (three) times daily as needed for anxiety.   [DISCONTINUED] amphetamine-dextroamphetamine (ADDERALL) 20  MG tablet Take 1 tablet (20 mg total) by mouth 3 (three) times daily.   [DISCONTINUED] mometasone-formoterol (DULERA) 100-5 MCG/ACT AERO INHALE 2 PUFFS BY MOUTH INTO THE LUNGS EVERY 12 HOURS   ALPRAZolam (XANAX) 0.5 MG tablet Take 1 tablet (0.5 mg total) by mouth 3 (three) times daily as needed for anxiety.   amphetamine-dextroamphetamine (ADDERALL) 20 MG tablet Take 1 tablet (20 mg total) by mouth 3 (three) times daily.   hydrochlorothiazide (HYDRODIURIL) 12.5 MG tablet Take by mouth.   No facility-administered encounter medications on  file as of 08/04/2021.    Surgical History: Past Surgical History:  Procedure Laterality Date   ANTERIOR CERVICAL DISCECTOMY     APPENDECTOMY     BACK SURGERY     BACK SURGERY  12/04/2020   COLONOSCOPY  09/2013   elbow surgery     HEMORRHOID SURGERY     LAPAROSCOPIC APPENDECTOMY N/A 12/04/2017   Procedure: APPENDECTOMY LAPAROSCOPIC;  Surgeon: Vickie Epley, MD;  Location: [facility] ORS;  Service: General;  Laterality: N/A;   LIPOMA EXCISION Right    leg   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     SPINAL CORD STIMULATOR IMPLANT     UPPER GI ENDOSCOPY  09/2013   WRIST SURGERY Left 01/14/2021    Medical History: Past Medical History:  Diagnosis Date   Acute appendicitis 12/04/2017   Allergy    takes allergy shots   Anemia    Arthritis    Asthma    Barrett esophagus    Bronchitis    Chronic pain    Colon polyp    Depression    GERD (gastroesophageal reflux disease)    Hemorrhoid    Hyperlipidemia    Hypertension    Hypothyroid    IBS (irritable bowel syndrome)    Kidney stone    MI (myocardial infarction) ([place])    between 2002 and 2006   Migraine    Pneumonia    Seizures ([place])    Sinus problem    Spinal cord stimulator status     Family History: Family History  Problem Relation Age of Onset   Hypertension Mother    Lung disease Mother    Heart attack Father    Diabetes Father    Liver cancer Sister    Lung cancer Sister    Colon cancer Sister    Uterine cancer Sister     Social History   Socioeconomic History   Marital status: Married    Spouse name: Not on file   Number of children: Not on file   Years of education: Not on file   Highest education level: Not on file  Occupational History   Not on file  Tobacco Use   Smoking status: Former    Packs/day: 1.00    Years: 30.00    Total pack years: 30.00    Types: Cigarettes    Quit date: 02/22/2005    Years since quitting: 16.6   Smokeless tobacco: Never  Vaping Use   Vaping Use: Never  used  Substance and Sexual Activity   Alcohol use: Yes    Alcohol/week: 0.0 standard drinks of alcohol    Comment: occasionally   Drug use: No    Comment: past   Sexual activity: Yes  Other Topics Concern   Not on file  Social History Narrative   Not on file   Social Determinants of Health   Financial Resource Strain: Not on file  Food Insecurity: Not on file  Transportation  Needs: Not on file  Physical Activity: Not on file  Stress: Not on file  Social Connections: Not on file  Intimate Partner Violence: Not on file      Review of Systems  Constitutional:  Positive for fatigue. Negative for chills and unexpected weight change.  HENT:  Negative for congestion, rhinorrhea, sneezing and sore throat.   Respiratory:  Negative for cough, chest tightness, shortness of breath and wheezing.   Cardiovascular: Negative.  Negative for chest pain and palpitations.  Gastrointestinal:  Negative for abdominal pain, constipation, diarrhea, nausea and vomiting.  Musculoskeletal:  Positive for arthralgias. Negative for back pain, joint swelling and neck pain.  Skin: Negative.   Neurological:  Negative for dizziness, tremors and numbness.  Hematological:  Negative for adenopathy. Does not bruise/bleed easily.  Psychiatric/Behavioral:  Positive for decreased concentration and sleep disturbance. Negative for behavioral problems (Depression), self-injury and suicidal ideas. The patient is nervous/anxious.     Vital Signs: BP 137/63   Pulse 73   Temp 98.9 F (37.2 C)   Resp 16   Ht 6' (1.829 m)   Wt 214 lb 9.6 oz (97.3 kg)   SpO2 98%   BMI 29.10 kg/m    Physical Exam Vitals reviewed.  Constitutional:      General: He is not in acute distress.    Appearance: Normal appearance. He is not ill-appearing.  HENT:     Head: Normocephalic and atraumatic.  Eyes:     Pupils: Pupils are equal, round, and reactive to light.  Cardiovascular:     Rate and Rhythm: Normal rate and regular  rhythm.     Heart sounds: Normal heart sounds. No murmur heard. Pulmonary:     Effort: Pulmonary effort is normal. No respiratory distress.     Breath sounds: Normal breath sounds. No wheezing.  Neurological:     Mental Status: He is alert and oriented to person, place, and time.  Psychiatric:        Mood and Affect: Mood normal.        Behavior: Behavior normal.        Assessment/Plan: 1. Narcolepsy due to underlying condition without cataplexy 1 additional fill of Adderall prescribed while the patient is waiting for his modafinil prescription to be approved by his insurance.  Once the modafinil is approved and the patient is able to fill the prescription, he will take 1 tablet by mouth daily. - modafinil (PROVIGIL) 200 MG tablet; Take 1 tablet (200 mg total) by mouth daily.  Dispense: 90 tablet; Refill: 0 - amphetamine-dextroamphetamine (ADDERALL) 20 MG tablet; Take 1 tablet (20 mg total) by mouth 3 (three) times daily.  Dispense: 60 tablet; Refill: 0  2. Other fatigue The fatigue is most likely multifactorial and the lack of adequate medication to keep him awake during the day is a contributing factor along with his narcolepsy.  We are still waiting to see what his vitamin levels are to see if a vitamin deficiency is also contributing and the patient was reminded to have his labs drawn.  He sees a hematologist for chronic anemia with iron deficiency  3. Generalized anxiety disorder with panic attacks Stable with current medications and takes alprazolam up to 3 times daily as needed for increased anxiety and panic attacks. - ALPRAZolam (XANAX) 0.5 MG tablet; Take 1 tablet (0.5 mg total) by mouth 3 (three) times daily as needed for anxiety.  Dispense: 90 tablet; Refill: 2  4. Vitamin D deficiency Reminded patient to have labs drawn, has  fluctuating vitamin D levels prior.  5. B12 deficiency Reminded patient to have labs drawn to evaluate his B12 and folate levels to assess for any  deficiencies that could contribute to fatigue.   General Counseling: Randy Mclean understanding of the findings of todays visit and agrees with plan of treatment. I have discussed any further diagnostic evaluation that may be needed or ordered today. We also reviewed his medications today. he has been encouraged to call the office with any questions or concerns that should arise related to todays visit.    No orders of the defined types were placed in this encounter.   Meds ordered this encounter  Medications   modafinil (PROVIGIL) 200 MG tablet    Sig: Take 1 tablet (200 mg total) by mouth daily.    Dispense:  90 tablet    Refill:  0   amphetamine-dextroamphetamine (ADDERALL) 20 MG tablet    Sig: Take 1 tablet (20 mg total) by mouth 3 (three) times daily.    Dispense:  60 tablet    Refill:  0    Fill today in addition to modafanil, will use adderall while waiting for PA to go through   ALPRAZolam Duanne Moron) 0.5 MG tablet    Sig: Take 1 tablet (0.5 mg total) by mouth 3 (three) times daily as needed for anxiety.    Dispense:  90 tablet    Refill:  2    Return in about 3 months (around 11/04/2021) for F/U, med refill, Randy Mclean PCP.   Total time spent:30 Minutes Time spent includes review of chart, medications, test results, and follow up plan with the patient.   [place]andy Mclean Controlled Substance Database was reviewed by me.  This patient was seen by Randy Osgood, FNP-C in collaboration with Dr. Clayborn Bigness as a part of collaborative care agreement.   Randy Vandervort R. Valetta Fuller, MSN, FNP-C Internal medicine

## 2021-08-09 ENCOUNTER — Telehealth: Payer: Self-pay

## 2021-08-09 NOTE — Telephone Encounter (Signed)
PA for MODAFINIL 200 mg was sent 08/09/21 @ 11:50 pm and came back Approved valid from 08/09/21 to 08/08/22

## 2021-08-10 ENCOUNTER — Other Ambulatory Visit: Payer: Self-pay

## 2021-08-10 ENCOUNTER — Inpatient Hospital Stay: Payer: BC Managed Care – PPO | Attending: Nurse Practitioner

## 2021-08-10 DIAGNOSIS — E039 Hypothyroidism, unspecified: Secondary | ICD-10-CM | POA: Insufficient documentation

## 2021-08-10 DIAGNOSIS — D508 Other iron deficiency anemias: Secondary | ICD-10-CM

## 2021-08-10 DIAGNOSIS — Z888 Allergy status to other drugs, medicaments and biological substances status: Secondary | ICD-10-CM | POA: Insufficient documentation

## 2021-08-10 DIAGNOSIS — Z79890 Hormone replacement therapy (postmenopausal): Secondary | ICD-10-CM | POA: Insufficient documentation

## 2021-08-10 DIAGNOSIS — J45909 Unspecified asthma, uncomplicated: Secondary | ICD-10-CM | POA: Diagnosis not present

## 2021-08-10 DIAGNOSIS — Z836 Family history of other diseases of the respiratory system: Secondary | ICD-10-CM | POA: Diagnosis not present

## 2021-08-10 DIAGNOSIS — Z87891 Personal history of nicotine dependence: Secondary | ICD-10-CM | POA: Diagnosis not present

## 2021-08-10 DIAGNOSIS — Z8249 Family history of ischemic heart disease and other diseases of the circulatory system: Secondary | ICD-10-CM | POA: Insufficient documentation

## 2021-08-10 DIAGNOSIS — Z800 Family history of malignant neoplasm of digestive organs: Secondary | ICD-10-CM | POA: Diagnosis not present

## 2021-08-10 DIAGNOSIS — E559 Vitamin D deficiency, unspecified: Secondary | ICD-10-CM

## 2021-08-10 DIAGNOSIS — Z801 Family history of malignant neoplasm of trachea, bronchus and lung: Secondary | ICD-10-CM | POA: Diagnosis not present

## 2021-08-10 DIAGNOSIS — I252 Old myocardial infarction: Secondary | ICD-10-CM | POA: Insufficient documentation

## 2021-08-10 DIAGNOSIS — I10 Essential (primary) hypertension: Secondary | ICD-10-CM | POA: Diagnosis not present

## 2021-08-10 DIAGNOSIS — R7989 Other specified abnormal findings of blood chemistry: Secondary | ICD-10-CM | POA: Insufficient documentation

## 2021-08-10 DIAGNOSIS — Z9049 Acquired absence of other specified parts of digestive tract: Secondary | ICD-10-CM | POA: Insufficient documentation

## 2021-08-10 DIAGNOSIS — Z880 Allergy status to penicillin: Secondary | ICD-10-CM | POA: Diagnosis not present

## 2021-08-10 DIAGNOSIS — D509 Iron deficiency anemia, unspecified: Secondary | ICD-10-CM | POA: Diagnosis not present

## 2021-08-10 DIAGNOSIS — Z8049 Family history of malignant neoplasm of other genital organs: Secondary | ICD-10-CM | POA: Insufficient documentation

## 2021-08-10 DIAGNOSIS — Z79899 Other long term (current) drug therapy: Secondary | ICD-10-CM | POA: Diagnosis not present

## 2021-08-10 DIAGNOSIS — Z833 Family history of diabetes mellitus: Secondary | ICD-10-CM | POA: Diagnosis not present

## 2021-08-10 DIAGNOSIS — Z8719 Personal history of other diseases of the digestive system: Secondary | ICD-10-CM | POA: Insufficient documentation

## 2021-08-10 LAB — CBC WITH DIFFERENTIAL/PLATELET
Abs Immature Granulocytes: 0.01 10*3/uL (ref 0.00–0.07)
Basophils Absolute: 0.1 10*3/uL (ref 0.0–0.1)
Basophils Relative: 1 %
Eosinophils Absolute: 0.2 10*3/uL (ref 0.0–0.5)
Eosinophils Relative: 2 %
HCT: 36.4 % — ABNORMAL LOW (ref 39.0–52.0)
Hemoglobin: 11.1 g/dL — ABNORMAL LOW (ref 13.0–17.0)
Immature Granulocytes: 0 %
Lymphocytes Relative: 22 %
Lymphs Abs: 1.5 10*3/uL (ref 0.7–4.0)
MCH: 22.5 pg — ABNORMAL LOW (ref 26.0–34.0)
MCHC: 30.5 g/dL (ref 30.0–36.0)
MCV: 73.7 fL — ABNORMAL LOW (ref 80.0–100.0)
Monocytes Absolute: 0.8 10*3/uL (ref 0.1–1.0)
Monocytes Relative: 12 %
Neutro Abs: 4.2 10*3/uL (ref 1.7–7.7)
Neutrophils Relative %: 63 %
Platelets: 308 10*3/uL (ref 150–400)
RBC: 4.94 MIL/uL (ref 4.22–5.81)
RDW: 18 % — ABNORMAL HIGH (ref 11.5–15.5)
WBC: 6.8 10*3/uL (ref 4.0–10.5)
nRBC: 0 % (ref 0.0–0.2)

## 2021-08-10 LAB — COMPREHENSIVE METABOLIC PANEL
ALT: 21 U/L (ref 0–44)
AST: 24 U/L (ref 15–41)
Albumin: 4.2 g/dL (ref 3.5–5.0)
Alkaline Phosphatase: 64 U/L (ref 38–126)
Anion gap: 8 (ref 5–15)
BUN: 15 mg/dL (ref 6–20)
CO2: 32 mmol/L (ref 22–32)
Calcium: 8.9 mg/dL (ref 8.9–10.3)
Chloride: 99 mmol/L (ref 98–111)
Creatinine, Ser: 1.91 mg/dL — ABNORMAL HIGH (ref 0.61–1.24)
GFR, Estimated: 40 mL/min — ABNORMAL LOW (ref >60)
Glucose, Bld: 110 mg/dL — ABNORMAL HIGH (ref 70–99)
Potassium: 3.6 mmol/L (ref 3.5–5.1)
Sodium: 139 mmol/L (ref 135–145)
Total Bilirubin: 0.6 mg/dL (ref 0.3–1.2)
Total Protein: 7.2 g/dL (ref 6.5–8.1)

## 2021-08-10 LAB — IRON AND TIBC
Iron: 41 ug/dL — ABNORMAL LOW (ref 45–182)
Saturation Ratios: 9 % — ABNORMAL LOW (ref 17.9–39.5)
TIBC: 480 ug/dL — ABNORMAL HIGH (ref 250–450)
UIBC: 439 ug/dL

## 2021-08-10 LAB — FERRITIN: Ferritin: 5 ng/mL — ABNORMAL LOW (ref 24–336)

## 2021-08-10 LAB — FOLATE: Folate: 28 ng/mL (ref >5.9)

## 2021-08-10 LAB — VITAMIN D 25 HYDROXY (VIT D DEFICIENCY, FRACTURES): Vit D, 25-Hydroxy: 35.52 ng/mL (ref 30–100)

## 2021-08-10 LAB — VITAMIN B12: Vitamin B-12: 375 pg/mL (ref 180–914)

## 2021-08-11 ENCOUNTER — Inpatient Hospital Stay (HOSPITAL_BASED_OUTPATIENT_CLINIC_OR_DEPARTMENT_OTHER): Payer: BC Managed Care – PPO | Admitting: Medical Oncology

## 2021-08-11 DIAGNOSIS — D509 Iron deficiency anemia, unspecified: Secondary | ICD-10-CM | POA: Diagnosis not present

## 2021-08-11 DIAGNOSIS — E039 Hypothyroidism, unspecified: Secondary | ICD-10-CM | POA: Diagnosis not present

## 2021-08-11 DIAGNOSIS — Z79899 Other long term (current) drug therapy: Secondary | ICD-10-CM | POA: Diagnosis not present

## 2021-08-11 DIAGNOSIS — Z9049 Acquired absence of other specified parts of digestive tract: Secondary | ICD-10-CM | POA: Diagnosis not present

## 2021-08-11 DIAGNOSIS — R7989 Other specified abnormal findings of blood chemistry: Secondary | ICD-10-CM

## 2021-08-11 DIAGNOSIS — D500 Iron deficiency anemia secondary to blood loss (chronic): Secondary | ICD-10-CM | POA: Diagnosis not present

## 2021-08-11 DIAGNOSIS — E559 Vitamin D deficiency, unspecified: Secondary | ICD-10-CM | POA: Diagnosis not present

## 2021-08-11 DIAGNOSIS — J45909 Unspecified asthma, uncomplicated: Secondary | ICD-10-CM | POA: Diagnosis not present

## 2021-08-11 DIAGNOSIS — Z8249 Family history of ischemic heart disease and other diseases of the circulatory system: Secondary | ICD-10-CM | POA: Diagnosis not present

## 2021-08-11 DIAGNOSIS — Z801 Family history of malignant neoplasm of trachea, bronchus and lung: Secondary | ICD-10-CM | POA: Diagnosis not present

## 2021-08-11 DIAGNOSIS — Z888 Allergy status to other drugs, medicaments and biological substances status: Secondary | ICD-10-CM | POA: Diagnosis not present

## 2021-08-11 DIAGNOSIS — Z8719 Personal history of other diseases of the digestive system: Secondary | ICD-10-CM | POA: Diagnosis not present

## 2021-08-11 DIAGNOSIS — I252 Old myocardial infarction: Secondary | ICD-10-CM | POA: Diagnosis not present

## 2021-08-11 DIAGNOSIS — I10 Essential (primary) hypertension: Secondary | ICD-10-CM | POA: Diagnosis not present

## 2021-08-11 DIAGNOSIS — Z880 Allergy status to penicillin: Secondary | ICD-10-CM | POA: Diagnosis not present

## 2021-08-11 DIAGNOSIS — Z87891 Personal history of nicotine dependence: Secondary | ICD-10-CM | POA: Diagnosis not present

## 2021-08-11 DIAGNOSIS — Z79890 Hormone replacement therapy (postmenopausal): Secondary | ICD-10-CM | POA: Diagnosis not present

## 2021-08-11 NOTE — Progress Notes (Signed)
Virtual Visit Progress Note  Mr. Rica Mote are scheduled for a virtual visit with your provider today.    Just as we do with appointments in the office, we must obtain your consent to participate.  Your consent will be active for this visit and any virtual visit you may have with one of our providers in the next 365 days.    If you have a MyChart account, I can also send a copy of this consent to you electronically.  All virtual visits are billed to your insurance company just like a traditional visit in the office.  As this is a virtual visit, video technology does not allow for your provider to perform a traditional examination.  This may limit your provider's ability to fully assess your condition.  If your provider identifies any concerns that need to be evaluated in person or the need to arrange testing such as labs, EKG, etc, we will make arrangements to do so.    Although advances in technology are sophisticated, we cannot ensure that it will always work on either your end or our end.  If the connection with a video visit is poor, we may have to switch to a telephone visit.  With either a video or telephone visit, we are not always able to ensure that we have a secure connection.   I need to obtain your verbal consent now.   Are you willing to proceed with your visit today?   ZELMA MAZARIEGO Younger has provided verbal consent on 08/11/2021 for a virtual visit (video or telephone).   Hughie Closs, PA-C 08/11/2021  2:52 PM    I connected with [place] Lions Younger on 08/11/21 at  2:30 PM EDT by video enabled telemedicine visit and verified that I am speaking with the correct person using two identifiers.   I discussed the limitations, risks, security and privacy concerns of performing an evaluation and management service by telemedicine and the availability of in-person appointments. I also discussed with the patient that there may be a patient responsible charge related to  this service. The patient expressed understanding and agreed to proceed.   Other persons participating in the visit and their role in the encounter: None   Patient's location: Home  Provider's location: Clinic   Chief Complaint: Anemia    Patient Care Team: Jonetta Osgood, NP as PCP - General (Nurse Practitioner) Robert Bellow, MD (General Surgery) Lavera Guise, MD (Internal Medicine) Lloyd Huger, MD as Consulting Physician (Oncology)   Name of the patient: Randy Mclean  [id number]  1960-06-14   Date of visit: 08/11/21  History of Presenting Illness- Patient has been followed for IDA for the past few years. Currently he is receiving Venofer PRN. Last series was in Feb 2023. He does not take an oral iron supplement due to constipation. Diet is high in iron. Tolerating the IV iron well. He reports that after his IV iron treatments he has rectal bleeding or bright red blood . No constipation- coffee ground like diarrhea which he sees every other day. Last colonoscopy/endoscopy was in in January 2022 which did not show signs of bleeding. No excessive fatigue or SOB.   Recent labs show elevated creatinine level. He denies vitamin C supplements. He does drink diet soda often during the day but also stays hydrated with water. He does not use NSAIDs currently but has extensive use in the past for chronic back pain. BP is well controlled. No recent  kidney stones. Urinating well.   Review of systems- ROS   Allergies  Allergen Reactions   Amoxicillin Anaphylaxis    REACTION: unspecified   Penicillin G Anaphylaxis   Gabapentin Other (See Comments)    REACTION: anaphylaxis   Penicillins     REACTION: unspecified    Past Medical History:  Diagnosis Date   Acute appendicitis 12/04/2017   Allergy    takes allergy shots   Anemia    Arthritis    Asthma    Barrett esophagus    Bronchitis    Chronic pain    Colon polyp    Depression    GERD (gastroesophageal  reflux disease)    Hemorrhoid    Hyperlipidemia    Hypertension    Hypothyroid    IBS (irritable bowel syndrome)    Kidney stone    MI (myocardial infarction) ([place])    between 2002 and 2006   Migraine    Pneumonia    Seizures ([place])    Sinus problem    Spinal cord stimulator status     Past Surgical History:  Procedure Laterality Date   ANTERIOR CERVICAL DISCECTOMY     APPENDECTOMY     BACK SURGERY     BACK SURGERY  12/04/2020   COLONOSCOPY  09/2013   elbow surgery     HEMORRHOID SURGERY     LAPAROSCOPIC APPENDECTOMY N/A 12/04/2017   Procedure: APPENDECTOMY LAPAROSCOPIC;  Surgeon: Vickie Epley, MD;  Location: [facility] ORS;  Service: General;  Laterality: N/A;   LIPOMA EXCISION Right    leg   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     SPINAL CORD STIMULATOR IMPLANT     UPPER GI ENDOSCOPY  09/2013   WRIST SURGERY Left 01/14/2021    Social History   Socioeconomic History   Marital status: Married    Spouse name: Not on file   Number of children: Not on file   Years of education: Not on file   Highest education level: Not on file  Occupational History   Not on file  Tobacco Use   Smoking status: Former    Packs/day: 1.00    Years: 30.00    Total pack years: 30.00    Types: Cigarettes    Quit date: 02/22/2005    Years since quitting: 16.4   Smokeless tobacco: Never  Vaping Use   Vaping Use: Never used  Substance and Sexual Activity   Alcohol use: Yes    Alcohol/week: 0.0 standard drinks of alcohol    Comment: occasionally   Drug use: No    Comment: past   Sexual activity: Yes  Other Topics Concern   Not on file  Social History Narrative   Not on file   Social Determinants of Health   Financial Resource Strain: Not on file  Food Insecurity: Not on file  Transportation Needs: Not on file  Physical Activity: Not on file  Stress: Not on file  Social Connections: Not on file  Intimate Partner Violence: Not on file    Immunization History   Administered Date(s) Administered   Influenza Inj Mdck Quad Pf 11/19/2019   Influenza, Quadrivalent, Recombinant, Inj, Pf 10/28/2020   Influenza,inj,Quad PF,6+ Mos 12/05/2017   Influenza-Unspecified 12/24/2014   PFIZER Comirnaty(Gray Top)Covid-19 Tri-Sucrose Vaccine 03/07/2020   PFIZER(Purple Top)SARS-COV-2 Vaccination 05/18/2019, 06/12/2019, 01/03/2020   Pneumococcal Polysaccharide-23 02/22/2005   Td 02/22/2005   Zoster Recombinat (Shingrix) 10/28/2020    Family History  Problem Relation Age of Onset   Hypertension Mother  Lung disease Mother    Heart attack Father    Diabetes Father    Liver cancer Sister    Lung cancer Sister    Colon cancer Sister    Uterine cancer Sister      Current Outpatient Medications:    albuterol (VENTOLIN HFA) 108 (90 Base) MCG/ACT inhaler, Inhale 2 puffs into the lungs every 4 (four) hours as needed for wheezing or shortness of breath., Disp: 8.5 g, Rfl: 3   alfuzosin (UROXATRAL) 10 MG 24 hr tablet, TAKE 1 TABLET(10 MG) BY MOUTH DAILY WITH BREAKFAST, Disp: 90 tablet, Rfl: 1   ALPRAZolam (XANAX) 0.5 MG tablet, Take 1 tablet (0.5 mg total) by mouth 3 (three) times daily as needed for anxiety., Disp: 90 tablet, Rfl: 2   amLODipine (NORVASC) 5 MG tablet, Take 10 mg by mouth daily., Disp: , Rfl:    amphetamine-dextroamphetamine (ADDERALL) 20 MG tablet, Take 1 tablet (20 mg total) by mouth 3 (three) times daily., Disp: 90 tablet, Rfl: 0   amphetamine-dextroamphetamine (ADDERALL) 20 MG tablet, Take 1 tablet (20 mg total) by mouth 3 (three) times daily., Disp: 90 tablet, Rfl: 0   amphetamine-dextroamphetamine (ADDERALL) 20 MG tablet, Take 1 tablet (20 mg total) by mouth 3 (three) times daily., Disp: 60 tablet, Rfl: 0   atorvastatin (LIPITOR) 10 MG tablet, TAKE 1 TABLET BY MOUTH DAILY, Disp: 90 tablet, Rfl: 1   buPROPion (WELLBUTRIN XL) 300 MG 24 hr tablet, TAKE 1 TABLET BY MOUTH DAILY, GENERIC EQUIVALENT FOR WELLBUTRIN XL. DUE FOR OFFICE VISIT., Disp: 90  tablet, Rfl: 1   citalopram (CELEXA) 20 MG tablet, TAKE 1 TABLET BY MOUTH DAILY, Disp: 90 tablet, Rfl: 1   esomeprazole (NEXIUM) 40 MG capsule, TK ONE C PO  BID UTD, Disp: , Rfl: 0   hydrochlorothiazide (HYDRODIURIL) 12.5 MG tablet, Take by mouth., Disp: , Rfl:    levothyroxine (SYNTHROID) 150 MCG tablet, TAKE 1 TABLET BY MOUTH DAILY BEFORE BREAKFAST, Disp: 90 tablet, Rfl: 1   magnesium gluconate (MAGONATE) 500 MG tablet, Take 500 mg by mouth daily., Disp: , Rfl:    meloxicam (MOBIC) 7.5 MG tablet, TAKE 1 TABLET BY MOUTH 2 TIMES DAILY, Disp: 180 tablet, Rfl: 1   methocarbamol (ROBAXIN) 750 MG tablet, Take 750 mg by mouth 4 (four) times daily as needed., Disp: , Rfl:    modafinil (PROVIGIL) 200 MG tablet, Take 1 tablet (200 mg total) by mouth daily., Disp: 90 tablet, Rfl: 0   mometasone-formoterol (DULERA) 100-5 MCG/ACT AERO, INHALE 2 PUFFS BY MOUTH INTO THE LUNGS EVERY 12 HOURS, Disp: 39 g, Rfl: 3   Multiple Vitamin (MULTI-VITAMINS) TABS, Take by mouth., Disp: , Rfl:    pregabalin (LYRICA) 100 MG capsule, Take 100 mg by mouth every 8 (eight) hours., Disp: , Rfl:    SYRINGE-NEEDLE, DISP, 3 ML 22G X 1" 3 ML MISC, Use as directed., Disp: , Rfl:    testosterone cypionate (DEPOTESTOSTERONE CYPIONATE) 200 MG/ML injection, INJECT 0.75 MLS INTO THE MUSCLE EVERY 7 DAYS AS DIRECTED, Disp: 10 mL, Rfl: 1   tiZANidine (ZANAFLEX) 4 MG tablet, Take 4 mg by mouth every 6 (six) hours as needed for muscle spasms. Pt takes one tablet at night, Disp: , Rfl:    valsartan (DIOVAN) 160 MG tablet, Take 160 mg by mouth daily., Disp: , Rfl:   Physical exam: Exam limited due to telemedicine Physical Exam    Assessment and plan- Patient is a 61 y.o. male    Visit Diagnosis 1. Iron deficiency anemia due  to chronic blood loss   2. Elevated serum creatinine   3. Vitamin D deficiency    Acute on chronic.  Sounds as if patient may have a mild chronic bleed with the labs supporting this suspicion.  I discussed with  patient who agrees.  He will schedule a follow-up ASAP with his gastroenterologist as he likely will need an endoscopy and colonoscopy.  We will set him up for IV Venofer which she has tolerated well in the past.  Plan for weekly x5 of IV Venofer.  We discussed red flag signs and symptoms.  He will plan to follow-up 3 months after last Venofer or sooner as needed. 2.  New.  Patient's greatest risk factor seems to be his past use of NSAIDs which has not  current any longer as well as high level of soda intake.  We discussed significantly  reducing his soda intake to help with his creatinine level as well as staying hydrated  with water.  I have placed a referral to nephrology as well for patient as he has had  higher than expected for his age creatinine levels went to more significant elevations  of creatinine over the past year.  I would like to have his PCP follow up on this finding  in 1 to 2 weeks with lab draw.  Discussed red flags.  For now he will avoid NSAIDs,  vitamin C supplementation, and continue good BP control  3.  Chronic and lower end of normal result.   Patient expressed understanding and was in agreement with this plan. He also arrives for wrap-up understands that He can call clinic at any time with any questions, concerns, or complaints.   I discussed the assessment and treatment plan with the patient. The patient was provided an opportunity to ask questions and all were answered. The patient agreed with the plan and demonstrated an understanding of the instructions.   The patient was advised to call back or seek an in-person evaluation if the symptoms worsen or if the condition fails to improve as anticipated.   I spent 25 minutes face-to-face video visit time dedicated to the care of this patient on the date of this encounter to include pre-visit review of recent labs, past lab trends, past colonoscopy reports, face-to-face time with the patient, and post visit ordering of  testing/documentation.    Thank you for allowing me to participate in the care of this very pleasant patient.   [place] [facility]

## 2021-08-11 NOTE — Progress Notes (Signed)
Called pt for rooming prior to mychart visit. He states that he is concerned as to why he continues to be anemic and is curious to the underlying cause of this. He is also concerned about his elevated creatinine.

## 2021-08-14 ENCOUNTER — Other Ambulatory Visit: Payer: Self-pay | Admitting: Nurse Practitioner

## 2021-08-28 ENCOUNTER — Inpatient Hospital Stay: Payer: BC Managed Care – PPO | Attending: Nurse Practitioner

## 2021-08-28 VITALS — BP 133/77 | HR 85 | Temp 97.2°F | Resp 16

## 2021-08-28 DIAGNOSIS — Z79899 Other long term (current) drug therapy: Secondary | ICD-10-CM | POA: Diagnosis not present

## 2021-08-28 DIAGNOSIS — D508 Other iron deficiency anemias: Secondary | ICD-10-CM | POA: Diagnosis not present

## 2021-08-28 MED ORDER — SODIUM CHLORIDE 0.9 % IV SOLN
Freq: Once | INTRAVENOUS | Status: AC
  Filled 2021-08-28: qty 250

## 2021-08-28 MED ORDER — SODIUM CHLORIDE 0.9 % IV SOLN
200.0000 mg | Freq: Once | INTRAVENOUS | Status: AC
  Administered 2021-08-28: 200 mg via INTRAVENOUS
  Filled 2021-08-28: qty 10

## 2021-08-31 ENCOUNTER — Encounter: Payer: Self-pay | Admitting: Nurse Practitioner

## 2021-08-31 ENCOUNTER — Telehealth: Payer: Self-pay

## 2021-08-31 NOTE — Telephone Encounter (Signed)
LMOM to let pt know that I did get his MODAFINIL PA approved and that he can check with pharmacy to have it filled.  Asked pt to call me back

## 2021-09-04 ENCOUNTER — Inpatient Hospital Stay: Payer: BC Managed Care – PPO

## 2021-09-04 VITALS — BP 135/75 | HR 81 | Temp 98.8°F | Resp 18

## 2021-09-04 DIAGNOSIS — D508 Other iron deficiency anemias: Secondary | ICD-10-CM

## 2021-09-04 DIAGNOSIS — Z79899 Other long term (current) drug therapy: Secondary | ICD-10-CM | POA: Diagnosis not present

## 2021-09-04 MED ORDER — SODIUM CHLORIDE 0.9 % IV SOLN
Freq: Once | INTRAVENOUS | Status: AC
  Filled 2021-09-04: qty 250

## 2021-09-04 MED ORDER — SODIUM CHLORIDE 0.9 % IV SOLN
200.0000 mg | Freq: Once | INTRAVENOUS | Status: AC
  Administered 2021-09-04: 200 mg via INTRAVENOUS
  Filled 2021-09-04: qty 200

## 2021-09-04 NOTE — Patient Instructions (Signed)

## 2021-09-07 ENCOUNTER — Inpatient Hospital Stay: Payer: BC Managed Care – PPO

## 2021-09-07 VITALS — BP 121/64 | HR 69 | Temp 97.6°F | Resp 16

## 2021-09-07 DIAGNOSIS — D508 Other iron deficiency anemias: Secondary | ICD-10-CM | POA: Diagnosis not present

## 2021-09-07 DIAGNOSIS — Z79899 Other long term (current) drug therapy: Secondary | ICD-10-CM | POA: Diagnosis not present

## 2021-09-07 MED ORDER — SODIUM CHLORIDE 0.9 % IV SOLN
200.0000 mg | Freq: Once | INTRAVENOUS | Status: AC
  Administered 2021-09-07: 200 mg via INTRAVENOUS
  Filled 2021-09-07: qty 200
  Filled 2021-09-07: qty 10

## 2021-09-07 MED ORDER — SODIUM CHLORIDE 0.9 % IV SOLN
INTRAVENOUS | Status: DC
  Filled 2021-09-07: qty 250

## 2021-09-07 NOTE — Patient Instructions (Signed)

## 2021-09-08 MED FILL — Iron Sucrose Inj 20 MG/ML (Fe Equiv): INTRAVENOUS | Qty: 10 | Status: AC

## 2021-09-09 ENCOUNTER — Inpatient Hospital Stay: Payer: BC Managed Care – PPO

## 2021-09-09 VITALS — BP 131/76 | HR 70 | Temp 97.6°F | Resp 16

## 2021-09-09 DIAGNOSIS — D508 Other iron deficiency anemias: Secondary | ICD-10-CM | POA: Diagnosis not present

## 2021-09-09 DIAGNOSIS — Z79899 Other long term (current) drug therapy: Secondary | ICD-10-CM | POA: Diagnosis not present

## 2021-09-09 MED ORDER — SODIUM CHLORIDE 0.9 % IV SOLN
INTRAVENOUS | Status: DC
  Filled 2021-09-09: qty 250

## 2021-09-09 MED ORDER — SODIUM CHLORIDE 0.9 % IV SOLN
200.0000 mg | Freq: Once | INTRAVENOUS | Status: AC
  Administered 2021-09-09: 200 mg via INTRAVENOUS
  Filled 2021-09-09: qty 200

## 2021-09-10 MED FILL — Iron Sucrose Inj 20 MG/ML (Fe Equiv): INTRAVENOUS | Qty: 10 | Status: AC

## 2021-09-11 ENCOUNTER — Inpatient Hospital Stay: Payer: BC Managed Care – PPO

## 2021-09-11 VITALS — BP 148/68 | HR 76 | Temp 97.2°F | Resp 17

## 2021-09-11 DIAGNOSIS — Z79899 Other long term (current) drug therapy: Secondary | ICD-10-CM | POA: Diagnosis not present

## 2021-09-11 DIAGNOSIS — D508 Other iron deficiency anemias: Secondary | ICD-10-CM | POA: Diagnosis not present

## 2021-09-11 MED ORDER — SODIUM CHLORIDE 0.9 % IV SOLN
200.0000 mg | Freq: Once | INTRAVENOUS | Status: AC
  Administered 2021-09-11: 200 mg via INTRAVENOUS
  Filled 2021-09-11: qty 200

## 2021-09-11 MED ORDER — SODIUM CHLORIDE 0.9 % IV SOLN
Freq: Once | INTRAVENOUS | Status: AC
  Filled 2021-09-11: qty 250

## 2021-09-14 DIAGNOSIS — G894 Chronic pain syndrome: Secondary | ICD-10-CM | POA: Diagnosis not present

## 2021-09-14 DIAGNOSIS — Z79891 Long term (current) use of opiate analgesic: Secondary | ICD-10-CM | POA: Diagnosis not present

## 2021-09-14 DIAGNOSIS — M47818 Spondylosis without myelopathy or radiculopathy, sacral and sacrococcygeal region: Secondary | ICD-10-CM | POA: Diagnosis not present

## 2021-09-14 DIAGNOSIS — M961 Postlaminectomy syndrome, not elsewhere classified: Secondary | ICD-10-CM | POA: Diagnosis not present

## 2021-09-25 ENCOUNTER — Other Ambulatory Visit: Payer: Self-pay | Admitting: Nurse Practitioner

## 2021-09-25 ENCOUNTER — Telehealth: Payer: Self-pay

## 2021-09-25 DIAGNOSIS — E291 Testicular hypofunction: Secondary | ICD-10-CM

## 2021-09-27 ENCOUNTER — Encounter: Payer: Self-pay | Admitting: Nurse Practitioner

## 2021-09-28 ENCOUNTER — Telehealth: Payer: Self-pay

## 2021-09-28 NOTE — Telephone Encounter (Signed)
Lvm notifying patient that 09/29/21 video appt was moved up to 9:00-[name]

## 2021-09-29 ENCOUNTER — Encounter: Payer: Self-pay | Admitting: Internal Medicine

## 2021-09-29 ENCOUNTER — Telehealth (INDEPENDENT_AMBULATORY_CARE_PROVIDER_SITE_OTHER): Payer: BC Managed Care – PPO | Admitting: Internal Medicine

## 2021-09-29 VITALS — Ht 72.0 in | Wt 207.0 lb

## 2021-09-29 DIAGNOSIS — D649 Anemia, unspecified: Secondary | ICD-10-CM | POA: Diagnosis not present

## 2021-09-29 DIAGNOSIS — J0100 Acute maxillary sinusitis, unspecified: Secondary | ICD-10-CM | POA: Diagnosis not present

## 2021-09-29 DIAGNOSIS — E291 Testicular hypofunction: Secondary | ICD-10-CM | POA: Diagnosis not present

## 2021-09-29 MED ORDER — TESTOSTERONE CYPIONATE 200 MG/ML IM SOLN: INTRAMUSCULAR | 1 refills | Status: DC

## 2021-09-29 MED ORDER — LEVOFLOXACIN 500 MG PO TABS: 500.0000 mg | ORAL_TABLET | Freq: Every day | ORAL | 0 refills | Status: DC

## 2021-09-29 NOTE — Progress Notes (Signed)
[facility] [place], [place] [address]  Internal MEDICINE  Office Visit Note  Patient Name: Randy Mclean Younger  [id number]  [id number]  Date of Service: 09/29/2021  Chief Complaint  Patient presents with   Telephone Assessment    [id number]   Telephone Screen   Sinusitis    Covid test negative    Headache   Cough    Green mucus    Fever    Last night 100   Medication Refill    Testosterone     HPI  Pt is connected via video visit 1.  Patient has not been feeling well for few days with sinus congestion pressure and cheek pain last night he noticed that he was also feverish with a temperature of 100.0.  COVID test was done at home which is negative patient has been taking over-the-counter medications with no relief. 2.  Patient also has been having anemia and has been getting iron infusion however hemoglobin is not maintained And drops quickly 3.  Patient is on chronic hormone replacement therapy due to low testosterone level   Current Medication: Outpatient Encounter Medications as of 09/29/2021  Medication Sig   albuterol (VENTOLIN HFA) 108 (90 Base) MCG/ACT inhaler Inhale 2 puffs into the lungs every 4 (four) hours as needed for wheezing or shortness of breath.   alfuzosin (UROXATRAL) 10 MG 24 hr tablet TAKE 1 TABLET(10 MG) BY MOUTH DAILY WITH BREAKFAST   ALPRAZolam (XANAX) 0.5 MG tablet Take 1 tablet (0.5 mg total) by mouth 3 (three) times daily as needed for anxiety.   amLODipine (NORVASC) 5 MG tablet Take 10 mg by mouth daily.   amphetamine-dextroamphetamine (ADDERALL) 20 MG tablet Take 1 tablet (20 mg total) by mouth 3 (three) times daily.   amphetamine-dextroamphetamine (ADDERALL) 20 MG tablet Take 1 tablet (20 mg total) by mouth 3 (three) times daily.   amphetamine-dextroamphetamine (ADDERALL) 20 MG tablet Take 1 tablet (20 mg total) by mouth 3 (three) times daily.   atorvastatin (LIPITOR) 10 MG tablet TAKE 1 TABLET BY MOUTH DAILY    buPROPion (WELLBUTRIN XL) 300 MG 24 hr tablet TAKE 1 TABLET BY MOUTH DAILY, GENERIC EQUIVALENT FOR WELLBUTRIN XL. DUE FOR OFFICE VISIT.   citalopram (CELEXA) 20 MG tablet TAKE 1 TABLET BY MOUTH DAILY   DULERA 100-5 MCG/ACT AERO INHALE 2 PUFFS BY MOUTH INTO THE LUNGS EVERY 12 HOURS   esomeprazole (NEXIUM) 40 MG capsule TK ONE C PO  BID UTD   levofloxacin (LEVAQUIN) 500 MG tablet Take 1 tablet (500 mg total) by mouth daily.   levothyroxine (SYNTHROID) 150 MCG tablet TAKE 1 TABLET BY MOUTH DAILY BEFORE BREAKFAST   magnesium gluconate (MAGONATE) 500 MG tablet Take 500 mg by mouth daily.   meloxicam (MOBIC) 7.5 MG tablet TAKE 1 TABLET BY MOUTH 2 TIMES DAILY   methocarbamol (ROBAXIN) 750 MG tablet Take 750 mg by mouth 4 (four) times daily as needed.   modafinil (PROVIGIL) 200 MG tablet Take 1 tablet (200 mg total) by mouth daily.   Multiple Vitamin (MULTI-VITAMINS) TABS Take by mouth.   pregabalin (LYRICA) 100 MG capsule Take 100 mg by mouth every 8 (eight) hours.   SYRINGE-NEEDLE, DISP, 3 ML 22G X 1" 3 ML MISC Use as directed.   testosterone cypionate (DEPOTESTOSTERONE CYPIONATE) 200 MG/ML injection INJECT 0.75 MLS INTO THE MUSCLE EVERY 7 DAYS AS DIRECTED   tiZANidine (ZANAFLEX) 4 MG tablet Take 4 mg by mouth every 6 (six) hours as needed for muscle spasms.  Pt takes one tablet at night   valsartan (DIOVAN) 160 MG tablet Take 160 mg by mouth daily.   hydrochlorothiazide (HYDRODIURIL) 12.5 MG tablet Take by mouth.   No facility-administered encounter medications on file as of 09/29/2021.    Surgical History: Past Surgical History:  Procedure Laterality Date   ANTERIOR CERVICAL DISCECTOMY     APPENDECTOMY     BACK SURGERY     BACK SURGERY  12/04/2020   COLONOSCOPY  09/2013   elbow surgery     HEMORRHOID SURGERY     LAPAROSCOPIC APPENDECTOMY N/A 12/04/2017   Procedure: APPENDECTOMY LAPAROSCOPIC;  Surgeon: Vickie Epley, MD;  Location: [facility] ORS;  Service: General;  Laterality: N/A;   LIPOMA  EXCISION Right    leg   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     SPINAL CORD STIMULATOR IMPLANT     UPPER GI ENDOSCOPY  09/2013   WRIST SURGERY Left 01/14/2021    Medical History: Past Medical History:  Diagnosis Date   Acute appendicitis 12/04/2017   Allergy    takes allergy shots   Anemia    Arthritis    Asthma    Barrett esophagus    Bronchitis    Chronic pain    Colon polyp    Depression    GERD (gastroesophageal reflux disease)    Hemorrhoid    Hyperlipidemia    Hypertension    Hypothyroid    IBS (irritable bowel syndrome)    Kidney stone    MI (myocardial infarction) ([place])    between 2002 and 2006   Migraine    Pneumonia    Seizures ([place])    Sinus problem    Spinal cord stimulator status     Family History: Family History  Problem Relation Age of Onset   Hypertension Mother    Lung disease Mother    Heart attack Father    Diabetes Father    Liver cancer Sister    Lung cancer Sister    Colon cancer Sister    Uterine cancer Sister     Social History   Socioeconomic History   Marital status: Married    Spouse name: Not on file   Number of children: Not on file   Years of education: Not on file   Highest education level: Not on file  Occupational History   Not on file  Tobacco Use   Smoking status: Former    Packs/day: 1.00    Years: 30.00    Total pack years: 30.00    Types: Cigarettes    Quit date: 02/22/2005    Years since quitting: 16.6   Smokeless tobacco: Never  Vaping Use   Vaping Use: Never used  Substance and Sexual Activity   Alcohol use: Yes    Alcohol/week: 0.0 standard drinks of alcohol    Comment: occasionally   Drug use: No    Comment: past   Sexual activity: Yes  Other Topics Concern   Not on file  Social History Narrative   Not on file   Social Determinants of Health   Financial Resource Strain: Not on file  Food Insecurity: Not on file  Transportation Needs: Not on file  Physical Activity: Not on file   Stress: Not on file  Social Connections: Not on file  Intimate Partner Violence: Not on file      Review of Systems  Constitutional:  Negative for fatigue and fever.  HENT:  Positive for sinus pressure. Negative for congestion, mouth sores  and postnasal drip.   Respiratory:  Positive for cough. Negative for choking.   Cardiovascular:  Negative for chest pain.  Genitourinary:  Negative for flank pain.  Psychiatric/Behavioral: Negative.      Vital Signs: Ht 6' (1.829 m)   Wt 207 lb (93.9 kg)   BMI 28.07 kg/m    Physical Exam  Pt looks uncomfortable but NAD, coughing   Assessment/Plan: 1. Acute non-recurrent maxillary sinusitis We will start Levaquin 500 mg once a day patient is active to keep an eye on his symptoms and notify if symptoms worse  2. Hypogonadism, testicular Refills given for estrogen replacement therapy however this is a higher dose we will reevaluate - testosterone cypionate (DEPOTESTOSTERONE CYPIONATE) 200 MG/ML injection; Inject 0.75 ml every week  Dispense: 10 mL; Refill: 1  3. Anemia, unspecified type Chronic anemia not responsive to iron infusions.  Might need further work-up, erythropoietin level will be checked on next visit  General Counseling: Keilon verbalizes understanding of the findings of todays visit and agrees with plan of treatment. I have discussed any further diagnostic evaluation that may be needed or ordered today. We also reviewed his medications today. he has been encouraged to call the office with any questions or concerns that should arise related to todays visit.    No orders of the defined types were placed in this encounter.   Meds ordered this encounter  Medications   levofloxacin (LEVAQUIN) 500 MG tablet    Sig: Take 1 tablet (500 mg total) by mouth daily.    Dispense:  10 tablet    Refill:  0    Total time spent:20 Minutes Time spent includes review of chart, medications, test results, and follow up plan with the  patient.   [place] Controlled Substance Database was reviewed by me.   Dr Lavera Guise Internal medicine

## 2021-09-29 NOTE — Telephone Encounter (Signed)
Spoke with phar and pt its right dose

## 2021-10-01 NOTE — Telephone Encounter (Signed)
error 

## 2021-10-02 ENCOUNTER — Telehealth: Payer: Self-pay

## 2021-10-02 NOTE — Telephone Encounter (Signed)
PA sent for Testosterone 200 mg/ml solu 10/02/21 @ 238 pm

## 2021-10-03 DIAGNOSIS — Z03818 Encounter for observation for suspected exposure to other biological agents ruled out: Secondary | ICD-10-CM | POA: Diagnosis not present

## 2021-10-03 DIAGNOSIS — M25512 Pain in left shoulder: Secondary | ICD-10-CM | POA: Diagnosis not present

## 2021-10-03 DIAGNOSIS — M542 Cervicalgia: Secondary | ICD-10-CM | POA: Diagnosis not present

## 2021-10-03 DIAGNOSIS — J019 Acute sinusitis, unspecified: Secondary | ICD-10-CM | POA: Diagnosis not present

## 2021-10-03 DIAGNOSIS — R051 Acute cough: Secondary | ICD-10-CM | POA: Diagnosis not present

## 2021-10-03 DIAGNOSIS — R300 Dysuria: Secondary | ICD-10-CM | POA: Diagnosis not present

## 2021-10-08 ENCOUNTER — Other Ambulatory Visit: Payer: Self-pay

## 2021-10-08 NOTE — Telephone Encounter (Signed)
Resent PA for TESTOSTERONE CYPIONATE 200 mg/ml 09/1721 @ 1203 pm

## 2021-10-11 ENCOUNTER — Other Ambulatory Visit: Payer: Self-pay | Admitting: Nurse Practitioner

## 2021-10-14 ENCOUNTER — Other Ambulatory Visit: Payer: Self-pay | Admitting: Nurse Practitioner

## 2021-10-14 DIAGNOSIS — N401 Enlarged prostate with lower urinary tract symptoms: Secondary | ICD-10-CM

## 2021-10-16 ENCOUNTER — Other Ambulatory Visit: Payer: Self-pay | Admitting: Nurse Practitioner

## 2021-10-16 ENCOUNTER — Telehealth: Payer: Self-pay

## 2021-10-16 DIAGNOSIS — E291 Testicular hypofunction: Secondary | ICD-10-CM

## 2021-10-16 DIAGNOSIS — E039 Hypothyroidism, unspecified: Secondary | ICD-10-CM

## 2021-10-16 MED ORDER — TESTOSTERONE CYPIONATE 200 MG/ML IM SOLN: INTRAMUSCULAR | 1 refills | Status: DC

## 2021-10-16 NOTE — Telephone Encounter (Signed)
Spoke to pt and I called [facility] and gave rx over the phone to Amy.  Advised pt that rx was called in and should be ready today

## 2021-10-21 ENCOUNTER — Encounter: Payer: BC Managed Care – PPO | Admitting: Nurse Practitioner

## 2021-10-30 ENCOUNTER — Telehealth: Payer: Self-pay | Admitting: Nurse Practitioner

## 2021-10-30 NOTE — Telephone Encounter (Signed)
Left vm and sent mychart message to confirm 11/05/21 appointment-[name]

## 2021-11-02 ENCOUNTER — Ambulatory Visit: Payer: BC Managed Care – PPO | Admitting: Nurse Practitioner

## 2021-11-04 ENCOUNTER — Telehealth: Payer: Self-pay | Admitting: Nurse Practitioner

## 2021-11-04 NOTE — Telephone Encounter (Signed)
Cancelled 11/05/21 cpe per patient's request. Lvm to r/s-[name]

## 2021-11-05 ENCOUNTER — Encounter: Payer: BC Managed Care – PPO | Admitting: Nurse Practitioner

## 2021-11-10 ENCOUNTER — Encounter: Payer: BC Managed Care – PPO | Admitting: Nurse Practitioner

## 2021-11-10 ENCOUNTER — Other Ambulatory Visit: Payer: Self-pay

## 2021-11-10 DIAGNOSIS — D508 Other iron deficiency anemias: Secondary | ICD-10-CM

## 2021-11-11 ENCOUNTER — Inpatient Hospital Stay: Payer: BC Managed Care – PPO | Attending: Nurse Practitioner

## 2021-11-11 DIAGNOSIS — J45909 Unspecified asthma, uncomplicated: Secondary | ICD-10-CM | POA: Diagnosis not present

## 2021-11-11 DIAGNOSIS — Z833 Family history of diabetes mellitus: Secondary | ICD-10-CM | POA: Insufficient documentation

## 2021-11-11 DIAGNOSIS — I252 Old myocardial infarction: Secondary | ICD-10-CM | POA: Diagnosis not present

## 2021-11-11 DIAGNOSIS — Z8249 Family history of ischemic heart disease and other diseases of the circulatory system: Secondary | ICD-10-CM | POA: Insufficient documentation

## 2021-11-11 DIAGNOSIS — Z8049 Family history of malignant neoplasm of other genital organs: Secondary | ICD-10-CM | POA: Insufficient documentation

## 2021-11-11 DIAGNOSIS — Z800 Family history of malignant neoplasm of digestive organs: Secondary | ICD-10-CM | POA: Insufficient documentation

## 2021-11-11 DIAGNOSIS — D509 Iron deficiency anemia, unspecified: Secondary | ICD-10-CM | POA: Insufficient documentation

## 2021-11-11 DIAGNOSIS — Z888 Allergy status to other drugs, medicaments and biological substances status: Secondary | ICD-10-CM | POA: Diagnosis not present

## 2021-11-11 DIAGNOSIS — R5383 Other fatigue: Secondary | ICD-10-CM | POA: Insufficient documentation

## 2021-11-11 DIAGNOSIS — D508 Other iron deficiency anemias: Secondary | ICD-10-CM | POA: Diagnosis present

## 2021-11-11 DIAGNOSIS — Z880 Allergy status to penicillin: Secondary | ICD-10-CM | POA: Diagnosis not present

## 2021-11-11 DIAGNOSIS — Z79899 Other long term (current) drug therapy: Secondary | ICD-10-CM | POA: Diagnosis not present

## 2021-11-11 DIAGNOSIS — Z801 Family history of malignant neoplasm of trachea, bronchus and lung: Secondary | ICD-10-CM | POA: Insufficient documentation

## 2021-11-11 DIAGNOSIS — E039 Hypothyroidism, unspecified: Secondary | ICD-10-CM | POA: Diagnosis not present

## 2021-11-11 DIAGNOSIS — Z79890 Hormone replacement therapy (postmenopausal): Secondary | ICD-10-CM | POA: Diagnosis not present

## 2021-11-11 DIAGNOSIS — I10 Essential (primary) hypertension: Secondary | ICD-10-CM | POA: Diagnosis not present

## 2021-11-11 DIAGNOSIS — Z8719 Personal history of other diseases of the digestive system: Secondary | ICD-10-CM | POA: Diagnosis not present

## 2021-11-11 DIAGNOSIS — Z9049 Acquired absence of other specified parts of digestive tract: Secondary | ICD-10-CM | POA: Diagnosis not present

## 2021-11-11 DIAGNOSIS — Z87891 Personal history of nicotine dependence: Secondary | ICD-10-CM | POA: Diagnosis not present

## 2021-11-11 LAB — COMPREHENSIVE METABOLIC PANEL
ALT: 26 U/L (ref 0–44)
AST: 23 U/L (ref 15–41)
Albumin: 4.3 g/dL (ref 3.5–5.0)
Alkaline Phosphatase: 96 U/L (ref 38–126)
Anion gap: 7 (ref 5–15)
BUN: 13 mg/dL (ref 6–20)
CO2: 31 mmol/L (ref 22–32)
Calcium: 9.6 mg/dL (ref 8.9–10.3)
Chloride: 101 mmol/L (ref 98–111)
Creatinine, Ser: 1.16 mg/dL (ref 0.61–1.24)
GFR, Estimated: >60 mL/min (ref >60)
Glucose, Bld: 90 mg/dL (ref 70–99)
Potassium: 3.7 mmol/L (ref 3.5–5.1)
Sodium: 139 mmol/L (ref 135–145)
Total Bilirubin: 0.4 mg/dL (ref 0.3–1.2)
Total Protein: 7.8 g/dL (ref 6.5–8.1)

## 2021-11-11 LAB — CBC WITH DIFFERENTIAL/PLATELET
Abs Immature Granulocytes: 0.03 10*3/uL (ref 0.00–0.07)
Basophils Absolute: 0.1 10*3/uL (ref 0.0–0.1)
Basophils Relative: 1 %
Eosinophils Absolute: 0.1 10*3/uL (ref 0.0–0.5)
Eosinophils Relative: 1 %
HCT: 42 % (ref 39.0–52.0)
Hemoglobin: 13.5 g/dL (ref 13.0–17.0)
Immature Granulocytes: 0 %
Lymphocytes Relative: 22 %
Lymphs Abs: 1.6 10*3/uL (ref 0.7–4.0)
MCH: 25.3 pg — ABNORMAL LOW (ref 26.0–34.0)
MCHC: 32.1 g/dL (ref 30.0–36.0)
MCV: 78.8 fL — ABNORMAL LOW (ref 80.0–100.0)
Monocytes Absolute: 0.6 10*3/uL (ref 0.1–1.0)
Monocytes Relative: 9 %
Neutro Abs: 4.9 10*3/uL (ref 1.7–7.7)
Neutrophils Relative %: 67 %
Platelets: 298 10*3/uL (ref 150–400)
RBC: 5.33 MIL/uL (ref 4.22–5.81)
RDW: 20.8 % — ABNORMAL HIGH (ref 11.5–15.5)
WBC: 7.4 10*3/uL (ref 4.0–10.5)
nRBC: 0 % (ref 0.0–0.2)

## 2021-11-11 LAB — FERRITIN: Ferritin: 30 ng/mL (ref 24–336)

## 2021-11-11 LAB — IRON AND TIBC
Iron: 53 ug/dL (ref 45–182)
Saturation Ratios: 15 % — ABNORMAL LOW (ref 17.9–39.5)
TIBC: 357 ug/dL (ref 250–450)
UIBC: 304 ug/dL

## 2021-11-11 LAB — VITAMIN B12: Vitamin B-12: 384 pg/mL (ref 180–914)

## 2021-11-12 ENCOUNTER — Encounter: Payer: Self-pay | Admitting: Oncology

## 2021-11-12 ENCOUNTER — Inpatient Hospital Stay (HOSPITAL_BASED_OUTPATIENT_CLINIC_OR_DEPARTMENT_OTHER): Payer: BC Managed Care – PPO | Admitting: Oncology

## 2021-11-12 VITALS — BP 140/86 | HR 75 | Temp 97.2°F | Wt 206.0 lb

## 2021-11-12 DIAGNOSIS — R5383 Other fatigue: Secondary | ICD-10-CM | POA: Diagnosis not present

## 2021-11-12 DIAGNOSIS — I252 Old myocardial infarction: Secondary | ICD-10-CM | POA: Diagnosis not present

## 2021-11-12 DIAGNOSIS — Z880 Allergy status to penicillin: Secondary | ICD-10-CM | POA: Diagnosis not present

## 2021-11-12 DIAGNOSIS — D509 Iron deficiency anemia, unspecified: Secondary | ICD-10-CM

## 2021-11-12 DIAGNOSIS — Z8249 Family history of ischemic heart disease and other diseases of the circulatory system: Secondary | ICD-10-CM | POA: Diagnosis not present

## 2021-11-12 DIAGNOSIS — I10 Essential (primary) hypertension: Secondary | ICD-10-CM | POA: Diagnosis not present

## 2021-11-12 DIAGNOSIS — Z9049 Acquired absence of other specified parts of digestive tract: Secondary | ICD-10-CM | POA: Diagnosis not present

## 2021-11-12 DIAGNOSIS — Z888 Allergy status to other drugs, medicaments and biological substances status: Secondary | ICD-10-CM | POA: Diagnosis not present

## 2021-11-12 DIAGNOSIS — Z801 Family history of malignant neoplasm of trachea, bronchus and lung: Secondary | ICD-10-CM | POA: Diagnosis not present

## 2021-11-12 DIAGNOSIS — Z79899 Other long term (current) drug therapy: Secondary | ICD-10-CM | POA: Diagnosis not present

## 2021-11-12 DIAGNOSIS — Z8719 Personal history of other diseases of the digestive system: Secondary | ICD-10-CM | POA: Diagnosis not present

## 2021-11-12 DIAGNOSIS — J45909 Unspecified asthma, uncomplicated: Secondary | ICD-10-CM | POA: Diagnosis not present

## 2021-11-12 DIAGNOSIS — Z79890 Hormone replacement therapy (postmenopausal): Secondary | ICD-10-CM | POA: Diagnosis not present

## 2021-11-12 DIAGNOSIS — Z87891 Personal history of nicotine dependence: Secondary | ICD-10-CM | POA: Diagnosis not present

## 2021-11-12 DIAGNOSIS — E039 Hypothyroidism, unspecified: Secondary | ICD-10-CM | POA: Diagnosis not present

## 2021-11-12 DIAGNOSIS — Z833 Family history of diabetes mellitus: Secondary | ICD-10-CM | POA: Diagnosis not present

## 2021-11-12 NOTE — Addendum Note (Signed)
Addended by: Delice Bison E on: 11/12/2021 04:00 PM   Modules accepted: Orders

## 2021-11-12 NOTE — Progress Notes (Signed)
[place]  [phone]) [phone] Fax:(336) [phone]  ID: Randy Mclean Younger OB: October 31, 1960  MR#: [id number]  POE#:[id number]  Patient Care Team: Jonetta Osgood, NP as PCP - General (Nurse Practitioner) Robert Bellow, MD (General Surgery) Lavera Guise, MD (Internal Medicine) Lloyd Huger, MD as Consulting Physician (Oncology)   CHIEF COMPLAINT: Iron deficiency anemia.  INTERVAL HISTORY: Patient returns to clinic today for repeat laboratory work, further evaluation, and consideration of additional Venofer.  He currently feels well and is asymptomatic, although admits to a mild creasing fatigue over the past 1 to 2 weeks.  He otherwise feels well. He has no neurologic complaints.  He denies any recent fevers or illnesses.  He has no chest pain, shortness of breath, cough, or hemoptysis.  He denies any nausea, vomiting, constipation, or diarrhea.  He has no melena or hematochezia.  He has no urinary complaints.  Offers no further specific complaints today.  REVIEW OF SYSTEMS:   Review of Systems  Constitutional:  Positive for malaise/fatigue. Negative for fever and weight loss.  Respiratory: Negative.  Negative for cough and shortness of breath.   Cardiovascular: Negative.  Negative for chest pain and leg swelling.  Gastrointestinal: Negative.  Negative for abdominal pain, blood in stool and melena.  Genitourinary: Negative.  Negative for hematuria.  Musculoskeletal: Negative.  Negative for back pain and neck pain.  Skin: Negative.  Negative for rash.  Neurological: Negative.  Negative for dizziness, sensory change, focal weakness, weakness and headaches.  Psychiatric/Behavioral: Negative.  The patient is not nervous/anxious.     As per HPI. Otherwise, a complete review of systems is negative.  PAST MEDICAL HISTORY: Past Medical History:  Diagnosis Date   Acute appendicitis 12/04/2017   Allergy    takes allergy shots   Anemia    Arthritis     Asthma    Barrett esophagus    Bronchitis    Chronic pain    Colon polyp    Depression    GERD (gastroesophageal reflux disease)    Hemorrhoid    Hyperlipidemia    Hypertension    Hypothyroid    IBS (irritable bowel syndrome)    Kidney stone    MI (myocardial infarction) ([place])    between 2002 and 2006   Migraine    Pneumonia    Seizures ([place])    Sinus problem    Spinal cord stimulator status     PAST SURGICAL HISTORY: Past Surgical History:  Procedure Laterality Date   ANTERIOR CERVICAL DISCECTOMY     APPENDECTOMY     BACK SURGERY     BACK SURGERY  12/04/2020   COLONOSCOPY  09/2013   elbow surgery     HEMORRHOID SURGERY     LAPAROSCOPIC APPENDECTOMY N/A 12/04/2017   Procedure: APPENDECTOMY LAPAROSCOPIC;  Surgeon: Vickie Epley, MD;  Location: [facility] ORS;  Service: General;  Laterality: N/A;   LIPOMA EXCISION Right    leg   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     SPINAL CORD STIMULATOR IMPLANT     UPPER GI ENDOSCOPY  09/2013   WRIST SURGERY Left 01/14/2021    FAMILY HISTORY Family History  Problem Relation Age of Onset   Hypertension Mother    Lung disease Mother    Heart attack Father    Diabetes Father    Liver cancer Sister    Lung cancer Sister    Colon cancer Sister    Uterine cancer Sister  ADVANCED DIRECTIVES:    HEALTH MAINTENANCE: Social History   Tobacco Use   Smoking status: Former    Packs/day: 1.00    Years: 30.00    Total pack years: 30.00    Types: Cigarettes    Quit date: 02/22/2005    Years since quitting: 16.7   Smokeless tobacco: Never  Vaping Use   Vaping Use: Never used  Substance Use Topics   Alcohol use: Yes    Alcohol/week: 0.0 standard drinks of alcohol    Comment: occasionally   Drug use: No    Comment: past     Colonoscopy:  PAP:  Bone density:  Lipid panel:  Allergies  Allergen Reactions   Amoxicillin Anaphylaxis    REACTION: unspecified   Penicillin G Anaphylaxis   Gabapentin Other  (See Comments)    REACTION: anaphylaxis   Penicillins     REACTION: unspecified    Current Outpatient Medications  Medication Sig Dispense Refill   albuterol (VENTOLIN HFA) 108 (90 Base) MCG/ACT inhaler Inhale 2 puffs into the lungs every 4 (four) hours as needed for wheezing or shortness of breath. 8.5 g 3   alfuzosin (UROXATRAL) 10 MG 24 hr tablet TAKE 1 TABLET(10 MG) BY MOUTH DAILY WITH BREAKFAST 90 tablet 1   ALPRAZolam (XANAX) 0.5 MG tablet Take 1 tablet (0.5 mg total) by mouth 3 (three) times daily as needed for anxiety. 90 tablet 2   amLODipine (NORVASC) 5 MG tablet Take 10 mg by mouth daily.     atorvastatin (LIPITOR) 10 MG tablet TAKE 1 TABLET BY MOUTH DAILY 90 tablet 1   buPROPion (WELLBUTRIN XL) 300 MG 24 hr tablet TAKE 1 TABLET BY MOUTH DAILY, GENERIC EQUIVALENT FOR WELLBUTRIN XL. DUE FOR OFFICE VISIT. 90 tablet 1   citalopram (CELEXA) 20 MG tablet TAKE 1 TABLET BY MOUTH DAILY 90 tablet 1   DULERA 100-5 MCG/ACT AERO INHALE 2 PUFFS BY MOUTH INTO THE LUNGS EVERY 12 HOURS 39 g 3   esomeprazole (NEXIUM) 40 MG capsule TK ONE C PO  BID UTD  0   levofloxacin (LEVAQUIN) 500 MG tablet Take 1 tablet (500 mg total) by mouth daily. 10 tablet 0   levothyroxine (SYNTHROID) 150 MCG tablet TAKE 1 TABLET BY MOUTH DAILY BEFORE BREAKFAST 90 tablet 1   magnesium gluconate (MAGONATE) 500 MG tablet Take 500 mg by mouth daily.     methocarbamol (ROBAXIN) 750 MG tablet Take 750 mg by mouth 4 (four) times daily as needed.     modafinil (PROVIGIL) 200 MG tablet Take 1 tablet (200 mg total) by mouth daily. 90 tablet 0   Multiple Vitamin (MULTI-VITAMINS) TABS Take by mouth.     pregabalin (LYRICA) 100 MG capsule Take 100 mg by mouth every 8 (eight) hours.     SYRINGE-NEEDLE, DISP, 3 ML 22G X 1" 3 ML MISC Use as directed.     testosterone cypionate (DEPOTESTOSTERONE CYPIONATE) 200 MG/ML injection Inject 0.75 ml every week 10 mL 1   tiZANidine (ZANAFLEX) 4 MG tablet Take 4 mg by mouth every 6 (six) hours as  needed for muscle spasms. Pt takes one tablet at night     valsartan (DIOVAN) 160 MG tablet Take 160 mg by mouth daily.     amphetamine-dextroamphetamine (ADDERALL) 20 MG tablet Take 1 tablet (20 mg total) by mouth 3 (three) times daily. 90 tablet 0   amphetamine-dextroamphetamine (ADDERALL) 20 MG tablet Take 1 tablet (20 mg total) by mouth 3 (three) times daily. 90 tablet 0   amphetamine-dextroamphetamine (  ADDERALL) 20 MG tablet Take 1 tablet (20 mg total) by mouth 3 (three) times daily. 60 tablet 0   hydrochlorothiazide (HYDRODIURIL) 12.5 MG tablet Take by mouth.     meloxicam (MOBIC) 7.5 MG tablet TAKE 1 TABLET BY MOUTH TWICE DAILY 180 tablet 1   No current facility-administered medications for this visit.    OBJECTIVE: Vitals:   11/12/21 1335  BP: (!) 140/86  Pulse: 75  Temp: (!) 97.2 F (36.2 C)      Body mass index is 27.94 kg/m.    ECOG FS:0 - Asymptomatic  General: Well-developed, well-nourished, no acute distress. Eyes: Pink conjunctiva, anicteric sclera. HEENT: Normocephalic, moist mucous membranes. Lungs: No audible wheezing or coughing. Heart: Regular rate and rhythm. Abdomen: Soft, nontender, no obvious distention. Musculoskeletal: No edema, cyanosis, or clubbing. Neuro: Alert, answering all questions appropriately. Cranial nerves grossly intact. Skin: No rashes or petechiae noted. Psych: Normal affect.  LAB RESULTS:  Lab Results  Component Value Date   NA 139 11/11/2021   K 3.7 11/11/2021   CL 101 11/11/2021   CO2 31 11/11/2021   GLUCOSE 90 11/11/2021   BUN 13 11/11/2021   CREATININE 1.16 11/11/2021   CALCIUM 9.6 11/11/2021   PROT 7.8 11/11/2021   ALBUMIN 4.3 11/11/2021   AST 23 11/11/2021   ALT 26 11/11/2021   ALKPHOS 96 11/11/2021   BILITOT 0.4 11/11/2021   GFRNONAA >60 11/11/2021   GFRAA >60 05/05/2019    Lab Results  Component Value Date   WBC 7.4 11/11/2021   NEUTROABS 4.9 11/11/2021   HGB 13.5 11/11/2021   HCT 42.0 11/11/2021   MCV 78.8  (L) 11/11/2021   PLT 298 11/11/2021   Lab Results  Component Value Date   IRON 53 11/11/2021   TIBC 357 11/11/2021   IRONPCTSAT 15 (L) 11/11/2021   Lab Results  Component Value Date   FERRITIN 30 11/11/2021     STUDIES: No results found.  ASSESSMENT: Iron deficiency anemia.  PLAN:    1.  Iron deficiency anemia: Patient's hemoglobin and iron stores are now within normal limits except for mildly decreased iron saturation level of 15%.  Return to clinic tomorrow for 1 infusion of IV Venofer.  Patient will then return to clinic in 4 months with repeat laboratory and video assisted telemedicine visit.  2. Family history of colon cancer:  Patient gets routine colonoscopies given his family history of colon cancer, all of which been unrevealing.  Patient reports his last colonoscopy was delayed secondary to Covid.  He has not rescheduled one at this point.  His genetic testing is negative. 3.  Neck pain: Resolved.  Patient underwent cervical vertebrae fusion surgery in fall 2020.  I spent a total of 30 minutes reviewing chart data, face-to-face evaluation with the patient, counseling and coordination of care as detailed above.    Patient expressed understanding and was in agreement with this plan. He also understands that He can call clinic at any time with any questions, concerns, or complaints.    Lloyd Huger, MD   11/12/2021 2:38 PM

## 2021-11-13 ENCOUNTER — Inpatient Hospital Stay: Payer: BC Managed Care – PPO

## 2021-11-23 ENCOUNTER — Inpatient Hospital Stay: Payer: BC Managed Care – PPO | Attending: Nurse Practitioner

## 2021-11-23 VITALS — BP 136/74 | HR 69 | Temp 96.3°F | Resp 18

## 2021-11-23 DIAGNOSIS — I10 Essential (primary) hypertension: Secondary | ICD-10-CM | POA: Diagnosis not present

## 2021-11-23 DIAGNOSIS — Z79899 Other long term (current) drug therapy: Secondary | ICD-10-CM | POA: Diagnosis not present

## 2021-11-23 DIAGNOSIS — D508 Other iron deficiency anemias: Secondary | ICD-10-CM | POA: Insufficient documentation

## 2021-11-23 DIAGNOSIS — E039 Hypothyroidism, unspecified: Secondary | ICD-10-CM | POA: Diagnosis not present

## 2021-11-23 MED ORDER — SODIUM CHLORIDE 0.9 % IV SOLN
INTRAVENOUS | Status: DC
  Filled 2021-11-23: qty 250

## 2021-11-23 MED ORDER — SODIUM CHLORIDE 0.9 % IV SOLN
200.0000 mg | Freq: Once | INTRAVENOUS | Status: AC
  Administered 2021-11-23: 200 mg via INTRAVENOUS
  Filled 2021-11-23: qty 200

## 2021-11-23 NOTE — Patient Instructions (Signed)
[facility]  Discharge Instructions: Thank you for choosing [facility] to provide your oncology and hematology care.  If you have a lab appointment with the Cancer Center, please go directly to the Cancer Center and check in at the registration area.  Wear comfortable clothing and clothing appropriate for easy access to any Portacath or PICC line.   We strive to give you quality time with your provider. You may need to reschedule your appointment if you arrive late (15 or more minutes).  Arriving late affects you and other patients whose appointments are after yours.  Also, if you miss three or more appointments without notifying the office, you may be dismissed from the clinic at the provider's discretion.      For prescription refill requests, have your pharmacy contact our office and allow 72 hours for refills to be completed.    Today you received the following chemotherapy and/or immunotherapy agents Venofer.      To help prevent nausea and vomiting after your treatment, we encourage you to take your nausea medication as directed.  BELOW ARE SYMPTOMS THAT SHOULD BE REPORTED IMMEDIATELY: *FEVER GREATER THAN 100.4 F (38 C) OR HIGHER *CHILLS OR SWEATING *NAUSEA AND VOMITING THAT IS NOT CONTROLLED WITH YOUR NAUSEA MEDICATION *UNUSUAL SHORTNESS OF BREATH *UNUSUAL BRUISING OR BLEEDING *URINARY PROBLEMS (pain or burning when urinating, or frequent urination) *BOWEL PROBLEMS (unusual diarrhea, constipation, pain near the anus) TENDERNESS IN MOUTH AND THROAT WITH OR WITHOUT PRESENCE OF ULCERS (sore throat, sores in mouth, or a toothache) UNUSUAL RASH, SWELLING OR PAIN  UNUSUAL VAGINAL DISCHARGE OR ITCHING   Items with * indicate a potential emergency and should be followed up as soon as possible or go to the Emergency Department if any problems should occur.  Please show the CHEMOTHERAPY ALERT CARD or IMMUNOTHERAPY ALERT CARD at check-in to  the Emergency Department and triage nurse.  Should you have questions after your visit or need to cancel or reschedule your appointment, please contact [facility]  [phone] and follow the prompts.  Office hours are 8:00 a.m. to 4:30 p.m. Monday - Friday. Please note that voicemails left after 4:00 p.m. may not be returned until the following business day.  We are closed weekends and major holidays. You have access to a nurse at all times for urgent questions. Please call the main number to the clinic [phone] and follow the prompts.  For any non-urgent questions, you may also contact your provider using MyChart. We now offer e-Visits for anyone 18 and older to request care online for non-urgent symptoms. For details visit [email or web address].   Also download the MyChart app! Go to the app store, search "MyChart", open the app, select [facility], and log in with your MyChart username and password.  Masks are optional in the cancer centers. If you would like for your care team to wear a mask while they are taking care of you, please let them know. For doctor visits, patients may have with them one support person who is at least 59 years old. At this time, visitors are not allowed in the infusion area.   

## 2021-11-23 NOTE — Telephone Encounter (Signed)
Pt needs awv

## 2021-11-27 ENCOUNTER — Telehealth: Payer: Self-pay | Admitting: Nurse Practitioner

## 2021-11-27 NOTE — Telephone Encounter (Signed)
Lvm to reschedule missed cpe-[name]

## 2021-12-07 ENCOUNTER — Telehealth: Payer: Self-pay

## 2021-12-09 ENCOUNTER — Other Ambulatory Visit: Payer: Self-pay | Admitting: Nurse Practitioner

## 2021-12-09 ENCOUNTER — Telehealth: Payer: Self-pay

## 2021-12-09 DIAGNOSIS — G47429 Narcolepsy in conditions classified elsewhere without cataplexy: Secondary | ICD-10-CM

## 2021-12-09 MED ORDER — MODAFINIL 200 MG PO TABS: 200.0000 mg | ORAL_TABLET | Freq: Every day | ORAL | 0 refills | Status: DC

## 2021-12-09 NOTE — Telephone Encounter (Signed)
Done

## 2021-12-09 NOTE — Telephone Encounter (Signed)
Lmom that we send med °

## 2021-12-16 ENCOUNTER — Other Ambulatory Visit: Payer: Self-pay | Admitting: Nurse Practitioner

## 2021-12-17 NOTE — Telephone Encounter (Signed)
Patient needs to keep his appointment in November.

## 2022-01-01 ENCOUNTER — Telehealth: Payer: Self-pay | Admitting: Nurse Practitioner

## 2022-01-01 NOTE — Telephone Encounter (Signed)
Left vm and sent mychart message to confirm 01/07/22 appointment-[name]

## 2022-01-04 DIAGNOSIS — M961 Postlaminectomy syndrome, not elsewhere classified: Secondary | ICD-10-CM | POA: Diagnosis not present

## 2022-01-04 DIAGNOSIS — Z79891 Long term (current) use of opiate analgesic: Secondary | ICD-10-CM | POA: Diagnosis not present

## 2022-01-04 DIAGNOSIS — M47818 Spondylosis without myelopathy or radiculopathy, sacral and sacrococcygeal region: Secondary | ICD-10-CM | POA: Diagnosis not present

## 2022-01-04 DIAGNOSIS — G894 Chronic pain syndrome: Secondary | ICD-10-CM | POA: Diagnosis not present

## 2022-01-07 ENCOUNTER — Encounter: Payer: Self-pay | Admitting: Nurse Practitioner

## 2022-01-07 ENCOUNTER — Ambulatory Visit (INDEPENDENT_AMBULATORY_CARE_PROVIDER_SITE_OTHER): Payer: BC Managed Care – PPO | Admitting: Nurse Practitioner

## 2022-01-07 VITALS — BP 153/80 | HR 97 | Temp 97.8°F | Resp 16 | Ht 72.0 in | Wt 218.6 lb

## 2022-01-07 DIAGNOSIS — Z0001 Encounter for general adult medical examination with abnormal findings: Secondary | ICD-10-CM | POA: Diagnosis not present

## 2022-01-07 DIAGNOSIS — E559 Vitamin D deficiency, unspecified: Secondary | ICD-10-CM

## 2022-01-07 DIAGNOSIS — F339 Major depressive disorder, recurrent, unspecified: Secondary | ICD-10-CM

## 2022-01-07 DIAGNOSIS — E039 Hypothyroidism, unspecified: Secondary | ICD-10-CM

## 2022-01-07 DIAGNOSIS — E782 Mixed hyperlipidemia: Secondary | ICD-10-CM

## 2022-01-07 DIAGNOSIS — Z23 Encounter for immunization: Secondary | ICD-10-CM

## 2022-01-07 DIAGNOSIS — F410 Panic disorder [episodic paroxysmal anxiety] without agoraphobia: Secondary | ICD-10-CM

## 2022-01-07 DIAGNOSIS — Z760 Encounter for issue of repeat prescription: Secondary | ICD-10-CM

## 2022-01-07 DIAGNOSIS — G47429 Narcolepsy in conditions classified elsewhere without cataplexy: Secondary | ICD-10-CM

## 2022-01-07 MED ORDER — BUPROPION HCL ER (XL) 300 MG PO TB24: ORAL_TABLET | ORAL | 1 refills | Status: DC

## 2022-01-07 MED ORDER — MODAFINIL 200 MG PO TABS: 200.0000 mg | ORAL_TABLET | Freq: Every day | ORAL | 0 refills | Status: DC

## 2022-01-07 MED ORDER — AMLODIPINE BESYLATE 5 MG PO TABS: 10.0000 mg | ORAL_TABLET | Freq: Every day | ORAL | 1 refills | Status: DC

## 2022-01-07 MED ORDER — DULERA 100-5 MCG/ACT IN AERO: INHALATION_SPRAY | RESPIRATORY_TRACT | 3 refills | Status: DC

## 2022-01-07 MED ORDER — VALSARTAN 160 MG PO TABS: 160.0000 mg | ORAL_TABLET | Freq: Every day | ORAL | 1 refills | Status: DC

## 2022-01-07 MED ORDER — CITALOPRAM HYDROBROMIDE 20 MG PO TABS: 20.0000 mg | ORAL_TABLET | Freq: Every day | ORAL | 1 refills | Status: DC

## 2022-01-07 MED ORDER — ALPRAZOLAM 0.5 MG PO TABS: 0.5000 mg | ORAL_TABLET | Freq: Three times a day (TID) | ORAL | 2 refills | Status: DC | PRN

## 2022-01-07 MED ORDER — TETANUS-DIPHTH-ACELL PERTUSSIS 5-2.5-18.5 LF-MCG/0.5 IM SUSP: 0.5000 mL | Freq: Once | INTRAMUSCULAR | 0 refills | Status: AC

## 2022-01-07 MED ORDER — HYDROCHLOROTHIAZIDE 12.5 MG PO TABS: 12.5000 mg | ORAL_TABLET | Freq: Every day | ORAL | 1 refills | Status: DC

## 2022-01-07 NOTE — Progress Notes (Signed)
[facility] [place], [place] [address]  Internal MEDICINE  Office Visit Note  Patient Name: Randy Mclean  [id number]  [id number]  Date of Service: 01/07/2022  Chief Complaint  Patient presents with   Annual Exam   Depression   Gastroesophageal Reflux   Hypertension   Hyperlipidemia    HPI Randy Mclean presents for an annual well visit and physical exam.  Well-appearing 61 year old male with  hypertension, narcolepsy, hypothyroidism, GERD, COPD, asthma, angina pectoris, bilateral carotid stenosis, carpal tunnel syndrome, chronic back pain, inflammatory polyarthritis, BPH, anxiety and depression. lives at home with his husband and works full time.  --not due for routine screening colonoscopy until 2025. Last PSA level is normal.  -Followed by hematology for anemia. -He has no new pains. He has no other questions or concerns.  ---feeling more depressed but is processing and managing appropriately . This is related to his sister who has small cell lung cancer and she has been getting sicker, having seizures and a stroke.  --due for routine labs     Current Medication: Outpatient Encounter Medications as of 01/07/2022  Medication Sig   albuterol (VENTOLIN HFA) 108 (90 Base) MCG/ACT inhaler Inhale 2 puffs into the lungs every 4 (four) hours as needed for wheezing or shortness of breath.   alfuzosin (UROXATRAL) 10 MG 24 hr tablet TAKE 1 TABLET(10 MG) BY MOUTH DAILY WITH BREAKFAST   atorvastatin (LIPITOR) 10 MG tablet TAKE 1 TABLET BY MOUTH DAILY   esomeprazole (NEXIUM) 40 MG capsule TK ONE C PO  BID UTD   levothyroxine (SYNTHROID) 150 MCG tablet TAKE 1 TABLET BY MOUTH DAILY BEFORE BREAKFAST   magnesium gluconate (MAGONATE) 500 MG tablet Take 500 mg by mouth daily.   methocarbamol (ROBAXIN) 750 MG tablet Take 750 mg by mouth 4 (four) times daily as needed.   Multiple Vitamin (MULTI-VITAMINS) TABS Take by mouth.   pregabalin (LYRICA) 100 MG capsule Take  100 mg by mouth every 8 (eight) hours.   SYRINGE-NEEDLE, DISP, 3 ML 22G X 1" 3 ML MISC Use as directed.   testosterone cypionate (DEPOTESTOSTERONE CYPIONATE) 200 MG/ML injection Inject 0.75 ml every week   tiZANidine (ZANAFLEX) 4 MG tablet Take 4 mg by mouth every 6 (six) hours as needed for muscle spasms. Pt takes one tablet at night   [DISCONTINUED] ALPRAZolam (XANAX) 0.5 MG tablet Take 1 tablet (0.5 mg total) by mouth 3 (three) times daily as needed for anxiety.   [DISCONTINUED] amLODipine (NORVASC) 5 MG tablet Take 10 mg by mouth daily.   [DISCONTINUED] buPROPion (WELLBUTRIN XL) 300 MG 24 hr tablet TAKE 1 TABLET BY MOUTH DAILY, GENERIC EQUIVALENT FOR WELLBUTRIN XL. DUE FOR OFFICE VISIT.   [DISCONTINUED] citalopram (CELEXA) 20 MG tablet TAKE 1 TABLET BY MOUTH DAILY   [DISCONTINUED] DULERA 100-5 MCG/ACT AERO INHALE 2 PUFFS BY MOUTH INTO THE LUNGS EVERY 12 HOURS   [DISCONTINUED] levofloxacin (LEVAQUIN) 500 MG tablet Take 1 tablet (500 mg total) by mouth daily.   [DISCONTINUED] modafinil (PROVIGIL) 200 MG tablet Take 1 tablet (200 mg total) by mouth daily.   [DISCONTINUED] Tdap (BOOSTRIX) 5-2.5-18.5 LF-MCG/0.5 injection Inject 0.5 mLs into the muscle once.   [DISCONTINUED] valsartan (DIOVAN) 160 MG tablet Take 160 mg by mouth daily.   ALPRAZolam (XANAX) 0.5 MG tablet Take 1 tablet (0.5 mg total) by mouth 3 (three) times daily as needed for anxiety.   amLODipine (NORVASC) 5 MG tablet Take 2 tablets (10 mg total) by mouth daily.  buPROPion (WELLBUTRIN XL) 300 MG 24 hr tablet TAKE 1 TABLET BY MOUTH DAILY, GENERIC EQUIVALENT FOR WELLBUTRIN XL. DUE FOR OFFICE VISIT.   citalopram (CELEXA) 20 MG tablet Take 1 tablet (20 mg total) by mouth daily.   hydrochlorothiazide (HYDRODIURIL) 12.5 MG tablet Take 1 tablet (12.5 mg total) by mouth daily.   modafinil (PROVIGIL) 200 MG tablet Take 1 tablet (200 mg total) by mouth daily.   mometasone-formoterol (DULERA) 100-5 MCG/ACT AERO INHALE 2 PUFFS BY MOUTH INTO  THE LUNGS EVERY 12 HOURS   Tdap (BOOSTRIX) 5-2.5-18.5 LF-MCG/0.5 injection Inject 0.5 mLs into the muscle once for 1 dose.   valsartan (DIOVAN) 160 MG tablet Take 1 tablet (160 mg total) by mouth daily.   [DISCONTINUED] amphetamine-dextroamphetamine (ADDERALL) 20 MG tablet Take 1 tablet (20 mg total) by mouth 3 (three) times daily.   [DISCONTINUED] amphetamine-dextroamphetamine (ADDERALL) 20 MG tablet Take 1 tablet (20 mg total) by mouth 3 (three) times daily.   [DISCONTINUED] amphetamine-dextroamphetamine (ADDERALL) 20 MG tablet Take 1 tablet (20 mg total) by mouth 3 (three) times daily.   [DISCONTINUED] hydrochlorothiazide (HYDRODIURIL) 12.5 MG tablet Take by mouth.   [DISCONTINUED] meloxicam (MOBIC) 7.5 MG tablet TAKE 1 TABLET BY MOUTH TWICE DAILY   No facility-administered encounter medications on file as of 01/07/2022.    Surgical History: Past Surgical History:  Procedure Laterality Date   ANTERIOR CERVICAL DISCECTOMY     APPENDECTOMY     BACK SURGERY     BACK SURGERY  12/04/2020   COLONOSCOPY  09/2013   elbow surgery     HEMORRHOID SURGERY     LAPAROSCOPIC APPENDECTOMY N/A 12/04/2017   Procedure: APPENDECTOMY LAPAROSCOPIC;  Surgeon: Vickie Epley, MD;  Location: [facility] ORS;  Service: General;  Laterality: N/A;   LIPOMA EXCISION Right    leg   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     SPINAL CORD STIMULATOR IMPLANT     UPPER GI ENDOSCOPY  09/2013   WRIST SURGERY Left 01/14/2021    Medical History: Past Medical History:  Diagnosis Date   Acute appendicitis 12/04/2017   Allergy    takes allergy shots   Anemia    Arthritis    Asthma    Barrett esophagus    Bronchitis    Chronic pain    Colon polyp    Depression    GERD (gastroesophageal reflux disease)    Hemorrhoid    Hyperlipidemia    Hypertension    Hypothyroid    IBS (irritable bowel syndrome)    Kidney stone    MI (myocardial infarction) ([place])    between 2002 and 2006   Migraine    Pneumonia     Seizures ([place])    Sinus problem    Spinal cord stimulator status     Family History: Family History  Problem Relation Age of Onset   Hypertension Mother    Lung disease Mother    Heart attack Father    Diabetes Father    Liver cancer Sister    Lung cancer Sister    Colon cancer Sister    Uterine cancer Sister     Social History   Socioeconomic History   Marital status: Married    Spouse name: Not on file   Number of children: Not on file   Years of education: Not on file   Highest education level: Not on file  Occupational History   Not on file  Tobacco Use   Smoking status: Former    Packs/day: 1.00  Years: 30.00    Total pack years: 30.00    Types: Cigarettes    Quit date: 02/22/2005    Years since quitting: 16.8   Smokeless tobacco: Never  Vaping Use   Vaping Use: Never used  Substance and Sexual Activity   Alcohol use: Yes    Alcohol/week: 0.0 standard drinks of alcohol    Comment: occasionally   Drug use: No    Comment: past   Sexual activity: Yes  Other Topics Concern   Not on file  Social History Narrative   Not on file   Social Determinants of Health   Financial Resource Strain: Not on file  Food Insecurity: Not on file  Transportation Needs: Not on file  Physical Activity: Not on file  Stress: Not on file  Social Connections: Not on file  Intimate Partner Violence: Not on file      Review of Systems  Constitutional:  Negative for activity change, appetite change, chills, fatigue, fever and unexpected weight change.  HENT: Negative.  Negative for congestion, ear pain, rhinorrhea, sore throat and trouble swallowing.   Eyes: Negative.   Respiratory: Negative.  Negative for cough, chest tightness, shortness of breath and wheezing.   Cardiovascular: Negative.  Negative for chest pain.  Gastrointestinal: Negative.  Negative for abdominal pain, blood in stool, constipation, diarrhea, nausea and vomiting.  Endocrine: Negative.   Genitourinary:  Negative.  Negative for difficulty urinating, dysuria, frequency, hematuria and urgency.  Musculoskeletal: Negative.  Negative for arthralgias, back pain, joint swelling, myalgias and neck pain.  Skin: Negative.  Negative for rash and wound.  Allergic/Immunologic: Negative.  Negative for immunocompromised state.  Neurological: Negative.  Negative for dizziness, seizures, numbness and headaches.  Hematological: Negative.   Psychiatric/Behavioral:  Negative for behavioral problems, self-injury and suicidal ideas. The patient is not nervous/anxious.     Vital Signs: BP (!) 153/80   Pulse 97   Temp 97.8 F (36.6 C)   Resp 16   Ht 6' (1.829 m)   Wt 218 lb 9.6 oz (99.2 kg)   SpO2 95%   BMI 29.65 kg/m    Physical Exam Vitals reviewed.  Constitutional:      General: He is awake. He is not in acute distress.    Appearance: Normal appearance. He is well-developed, well-groomed and overweight. He is not ill-appearing or diaphoretic.  HENT:     Head: Normocephalic and atraumatic.     Right Ear: Tympanic membrane, ear canal and external ear normal.     Left Ear: Tympanic membrane, ear canal and external ear normal.     Nose: Nose normal. No congestion or rhinorrhea.     Mouth/Throat:     Lips: Pink.     Mouth: Mucous membranes are moist.     Pharynx: Oropharynx is clear. Uvula midline. No oropharyngeal exudate or posterior oropharyngeal erythema.  Eyes:     General: Lids are normal. Vision grossly intact. Gaze aligned appropriately. No scleral icterus.       Right eye: No discharge.        Left eye: No discharge.     Extraocular Movements: Extraocular movements intact.     Conjunctiva/sclera: Conjunctivae normal.     Pupils: Pupils are equal, round, and reactive to light.     Funduscopic exam:    Right eye: Red reflex present.        Left eye: Red reflex present. Neck:     Thyroid: No thyromegaly.     Vascular: No carotid bruit or  JVD.     Trachea: Trachea and phonation normal. No  tracheal deviation.  Cardiovascular:     Rate and Rhythm: Normal rate and regular rhythm.     Pulses:          Carotid pulses are 3+ on the right side and 3+ on the left side.      Radial pulses are 2+ on the right side and 2+ on the left side.       Dorsalis pedis pulses are 2+ on the right side and 2+ on the left side.       Posterior tibial pulses are 2+ on the right side and 2+ on the left side.     Heart sounds: Normal heart sounds and S1 normal. No murmur heard.    No friction rub. No gallop.  Pulmonary:     Effort: Pulmonary effort is normal. No accessory muscle usage or respiratory distress.     Breath sounds: Normal breath sounds and air entry. No stridor. No wheezing or rales.  Chest:     Chest wall: No tenderness.  Abdominal:     General: Bowel sounds are normal. There is no distension.     Palpations: Abdomen is soft. There is no shifting dullness, fluid wave, mass or pulsatile mass.     Tenderness: There is no abdominal tenderness. There is no guarding or rebound.  Musculoskeletal:        General: No tenderness or deformity. Normal range of motion.     Cervical back: Normal range of motion and neck supple.     Right lower leg: No edema.     Left lower leg: No edema.  Lymphadenopathy:     Cervical: No cervical adenopathy.  Skin:    General: Skin is warm and dry.     Capillary Refill: Capillary refill takes less than 2 seconds.     Coloration: Skin is not pale.     Findings: No erythema or rash.  Neurological:     Mental Status: He is alert and oriented to person, place, and time.     Cranial Nerves: No cranial nerve deficit.     Motor: No abnormal muscle tone.     Coordination: Coordination normal.     Gait: Gait normal.     Deep Tendon Reflexes: Reflexes are normal and symmetric.  Psychiatric:        Mood and Affect: Mood and affect normal.        Behavior: Behavior normal. Behavior is cooperative.        Thought Content: Thought content normal.        Judgment:  Judgment normal.        Assessment/Plan: 1. Encounter for routine adult health examination with abnormal findings Age-appropriate preventive screenings and vaccinations discussed, annual physical exam completed. Routine labs for health maintenance ordered, see below. PHM updated.  - Lipid Profile - Vitamin D (25 hydroxy) - TSH + free T4  2. Acquired hypothyroidism Routine lab ordered. Continue levothyroxine as prescribed.  - TSH + free T4  3. Mixed hyperlipidemia Routine lab ordered - Lipid Profile - TSH + free T4  4. Vitamin D deficiency Routine lab ordered - Vitamin D (25 hydroxy)  5. Medication refill - modafinil (PROVIGIL) 200 MG tablet; Take 1 tablet (200 mg total) by mouth daily.  Dispense: 90 tablet; Refill: 0 - mometasone-formoterol (DULERA) 100-5 MCG/ACT AERO; INHALE 2 PUFFS BY MOUTH INTO THE LUNGS EVERY 12 HOURS  Dispense: 39 g; Refill: 3 - amLODipine (NORVASC)  5 MG tablet; Take 2 tablets (10 mg total) by mouth daily.  Dispense: 90 tablet; Refill: 1 - valsartan (DIOVAN) 160 MG tablet; Take 1 tablet (160 mg total) by mouth daily.  Dispense: 90 tablet; Refill: 1 - citalopram (CELEXA) 20 MG tablet; Take 1 tablet (20 mg total) by mouth daily.  Dispense: 90 tablet; Refill: 1 - hydrochlorothiazide (HYDRODIURIL) 12.5 MG tablet; Take 1 tablet (12.5 mg total) by mouth daily.  Dispense: 90 tablet; Refill: 1 - buPROPion (WELLBUTRIN XL) 300 MG 24 hr tablet; TAKE 1 TABLET BY MOUTH DAILY, GENERIC EQUIVALENT FOR WELLBUTRIN XL. DUE FOR OFFICE VISIT.  Dispense: 90 tablet; Refill: 1 - ALPRAZolam (XANAX) 0.5 MG tablet; Take 1 tablet (0.5 mg total) by mouth 3 (three) times daily as needed for anxiety.  Dispense: 90 tablet; Refill: 2  6. Need for vaccination - Tdap ([place]) 5-2.5-18.5 LF-MCG/0.5 injection; Inject 0.5 mLs into the muscle once for 1 dose.  Dispense: 0.5 mL; Refill: 0      General Counseling: Randy Mclean verbalizes understanding of the findings of todays visit and agrees  with plan of treatment. I have discussed any further diagnostic evaluation that may be needed or ordered today. We also reviewed his medications today. he has been encouraged to call the office with any questions or concerns that should arise related to todays visit.    Orders Placed This Encounter  Procedures   Lipid Profile   Vitamin D (25 hydroxy)   TSH + free T4    Meds ordered this encounter  Medications   Tdap (BOOSTRIX) 5-2.5-18.5 LF-MCG/0.5 injection    Sig: Inject 0.5 mLs into the muscle once for 1 dose.    Dispense:  0.5 mL    Refill:  0   modafinil (PROVIGIL) 200 MG tablet    Sig: Take 1 tablet (200 mg total) by mouth daily.    Dispense:  90 tablet    Refill:  0   mometasone-formoterol (DULERA) 100-5 MCG/ACT AERO    Sig: INHALE 2 PUFFS BY MOUTH INTO THE LUNGS EVERY 12 HOURS    Dispense:  39 g    Refill:  3   amLODipine (NORVASC) 5 MG tablet    Sig: Take 2 tablets (10 mg total) by mouth daily.    Dispense:  90 tablet    Refill:  1   valsartan (DIOVAN) 160 MG tablet    Sig: Take 1 tablet (160 mg total) by mouth daily.    Dispense:  90 tablet    Refill:  1   citalopram (CELEXA) 20 MG tablet    Sig: Take 1 tablet (20 mg total) by mouth daily.    Dispense:  90 tablet    Refill:  1    For future refills   hydrochlorothiazide (HYDRODIURIL) 12.5 MG tablet    Sig: Take 1 tablet (12.5 mg total) by mouth daily.    Dispense:  90 tablet    Refill:  1   buPROPion (WELLBUTRIN XL) 300 MG 24 hr tablet    Sig: TAKE 1 TABLET BY MOUTH DAILY, GENERIC EQUIVALENT FOR WELLBUTRIN XL. DUE FOR OFFICE VISIT.    Dispense:  90 tablet    Refill:  1   ALPRAZolam (XANAX) 0.5 MG tablet    Sig: Take 1 tablet (0.5 mg total) by mouth 3 (three) times daily as needed for anxiety.    Dispense:  90 tablet    Refill:  2    Return in about 3 months (around 04/09/2022) for F/U, med refill, Randy Mclean  PCP.   Total time spent:30 Minutes Time spent includes review of chart, medications, test results,  and follow up plan with the patient.    Controlled Substance Database was reviewed by me.  This patient was seen by Jonetta Osgood, FNP-C in collaboration with Dr. Clayborn Bigness as a part of collaborative care agreement.  Glenn Christo R. Valetta Fuller, MSN, FNP-C Internal medicine

## 2022-01-08 LAB — LIPID PANEL
Chol/HDL Ratio: 2.8 ratio (ref 0.0–5.0)
Cholesterol, Total: 171 mg/dL (ref 100–199)
HDL: 61 mg/dL (ref >39)
LDL Chol Calc (NIH): 96 mg/dL (ref 0–99)
Triglycerides: 75 mg/dL (ref 0–149)
VLDL Cholesterol Cal: 14 mg/dL (ref 5–40)

## 2022-01-08 LAB — VITAMIN D 25 HYDROXY (VIT D DEFICIENCY, FRACTURES): Vit D, 25-Hydroxy: 24.3 ng/mL — ABNORMAL LOW (ref 30.0–100.0)

## 2022-01-08 LAB — TSH+FREE T4
Free T4: 1.55 ng/dL (ref 0.82–1.77)
TSH: 0.186 u[IU]/mL — ABNORMAL LOW (ref 0.450–4.500)

## 2022-01-12 ENCOUNTER — Other Ambulatory Visit: Payer: Self-pay | Admitting: Nurse Practitioner

## 2022-01-29 NOTE — Progress Notes (Signed)
Vitamin D is slightly low, take OTC supplement  TSH is low, ask how he is feeling? May be ok if he feels alright at current dose of levothyroxine Cholesterol panel is perfect

## 2022-02-01 ENCOUNTER — Telehealth: Payer: Self-pay

## 2022-02-01 MED ORDER — HYDROCOD POLI-CHLORPHE POLI ER 10-8 MG/5ML PO SUER: 5.0000 mL | Freq: Two times a day (BID) | ORAL | 0 refills | Status: DC | PRN

## 2022-02-01 MED ORDER — AZITHROMYCIN 250 MG PO TABS: ORAL_TABLET | ORAL | 0 refills | Status: AC

## 2022-02-01 NOTE — Telephone Encounter (Signed)
Called patient, cough syrup and zpack ordered

## 2022-02-12 ENCOUNTER — Other Ambulatory Visit: Payer: Self-pay

## 2022-02-12 MED ORDER — PREDNISONE 10 MG PO TABS: ORAL_TABLET | ORAL | 0 refills | Status: DC

## 2022-02-12 NOTE — Telephone Encounter (Signed)
Pt called that he is still coughing finished antibiotic as per alyssa send prednisone

## 2022-02-22 ENCOUNTER — Encounter: Payer: Self-pay | Admitting: Nurse Practitioner

## 2022-03-10 ENCOUNTER — Encounter: Payer: Self-pay | Admitting: Oncology

## 2022-03-16 ENCOUNTER — Telehealth: Payer: Self-pay

## 2022-03-16 ENCOUNTER — Other Ambulatory Visit: Payer: Self-pay | Admitting: Internal Medicine

## 2022-03-16 ENCOUNTER — Other Ambulatory Visit: Payer: Self-pay | Admitting: Nurse Practitioner

## 2022-03-16 DIAGNOSIS — E291 Testicular hypofunction: Secondary | ICD-10-CM

## 2022-03-16 DIAGNOSIS — Z760 Encounter for issue of repeat prescription: Secondary | ICD-10-CM

## 2022-03-16 DIAGNOSIS — E039 Hypothyroidism, unspecified: Secondary | ICD-10-CM

## 2022-03-16 NOTE — Telephone Encounter (Signed)
Please review and send next appt 2/24

## 2022-03-17 ENCOUNTER — Encounter: Payer: Self-pay | Admitting: Oncology

## 2022-03-17 ENCOUNTER — Inpatient Hospital Stay: Payer: No Typology Code available for payment source | Attending: Oncology

## 2022-03-17 DIAGNOSIS — E785 Hyperlipidemia, unspecified: Secondary | ICD-10-CM | POA: Diagnosis not present

## 2022-03-17 DIAGNOSIS — R5383 Other fatigue: Secondary | ICD-10-CM | POA: Diagnosis not present

## 2022-03-17 DIAGNOSIS — E039 Hypothyroidism, unspecified: Secondary | ICD-10-CM | POA: Diagnosis not present

## 2022-03-17 DIAGNOSIS — Z7951 Long term (current) use of inhaled steroids: Secondary | ICD-10-CM | POA: Insufficient documentation

## 2022-03-17 DIAGNOSIS — Z9049 Acquired absence of other specified parts of digestive tract: Secondary | ICD-10-CM | POA: Insufficient documentation

## 2022-03-17 DIAGNOSIS — Z87891 Personal history of nicotine dependence: Secondary | ICD-10-CM | POA: Diagnosis not present

## 2022-03-17 DIAGNOSIS — Z79890 Hormone replacement therapy (postmenopausal): Secondary | ICD-10-CM | POA: Diagnosis not present

## 2022-03-17 DIAGNOSIS — Z79899 Other long term (current) drug therapy: Secondary | ICD-10-CM | POA: Insufficient documentation

## 2022-03-17 DIAGNOSIS — R531 Weakness: Secondary | ICD-10-CM | POA: Diagnosis not present

## 2022-03-17 DIAGNOSIS — G8929 Other chronic pain: Secondary | ICD-10-CM | POA: Insufficient documentation

## 2022-03-17 DIAGNOSIS — Z800 Family history of malignant neoplasm of digestive organs: Secondary | ICD-10-CM | POA: Insufficient documentation

## 2022-03-17 DIAGNOSIS — I252 Old myocardial infarction: Secondary | ICD-10-CM | POA: Insufficient documentation

## 2022-03-17 DIAGNOSIS — Z8719 Personal history of other diseases of the digestive system: Secondary | ICD-10-CM | POA: Insufficient documentation

## 2022-03-17 DIAGNOSIS — Z7952 Long term (current) use of systemic steroids: Secondary | ICD-10-CM | POA: Diagnosis not present

## 2022-03-17 DIAGNOSIS — Z880 Allergy status to penicillin: Secondary | ICD-10-CM | POA: Insufficient documentation

## 2022-03-17 DIAGNOSIS — D509 Iron deficiency anemia, unspecified: Secondary | ICD-10-CM | POA: Insufficient documentation

## 2022-03-17 LAB — CBC WITH DIFFERENTIAL/PLATELET
Abs Immature Granulocytes: 0.04 10*3/uL (ref 0.00–0.07)
Basophils Absolute: 0.1 10*3/uL (ref 0.0–0.1)
Basophils Relative: 2 %
Eosinophils Absolute: 0.1 10*3/uL (ref 0.0–0.5)
Eosinophils Relative: 1 %
HCT: 33 % — ABNORMAL LOW (ref 39.0–52.0)
Hemoglobin: 9.2 g/dL — ABNORMAL LOW (ref 13.0–17.0)
Immature Granulocytes: 1 %
Lymphocytes Relative: 18 %
Lymphs Abs: 1.3 10*3/uL (ref 0.7–4.0)
MCH: 21.1 pg — ABNORMAL LOW (ref 26.0–34.0)
MCHC: 27.9 g/dL — ABNORMAL LOW (ref 30.0–36.0)
MCV: 75.5 fL — ABNORMAL LOW (ref 80.0–100.0)
Monocytes Absolute: 0.7 10*3/uL (ref 0.1–1.0)
Monocytes Relative: 10 %
Neutro Abs: 4.9 10*3/uL (ref 1.7–7.7)
Neutrophils Relative %: 68 %
Platelets: 393 10*3/uL (ref 150–400)
RBC: 4.37 MIL/uL (ref 4.22–5.81)
RDW: 18.3 % — ABNORMAL HIGH (ref 11.5–15.5)
WBC: 7.1 10*3/uL (ref 4.0–10.5)
nRBC: 0 % (ref 0.0–0.2)

## 2022-03-17 LAB — IRON AND TIBC
Iron: 16 ug/dL — ABNORMAL LOW (ref 45–182)
Saturation Ratios: 3 % — ABNORMAL LOW (ref 17.9–39.5)
TIBC: 473 ug/dL — ABNORMAL HIGH (ref 250–450)
UIBC: 457 ug/dL

## 2022-03-17 LAB — COMPREHENSIVE METABOLIC PANEL
ALT: 18 U/L (ref 0–44)
AST: 22 U/L (ref 15–41)
Albumin: 4.1 g/dL (ref 3.5–5.0)
Alkaline Phosphatase: 82 U/L (ref 38–126)
Anion gap: 11 (ref 5–15)
BUN: 8 mg/dL (ref 6–20)
CO2: 28 mmol/L (ref 22–32)
Calcium: 9.1 mg/dL (ref 8.9–10.3)
Chloride: 101 mmol/L (ref 98–111)
Creatinine, Ser: 1.13 mg/dL (ref 0.61–1.24)
GFR, Estimated: >60 mL/min (ref >60)
Glucose, Bld: 87 mg/dL (ref 70–99)
Potassium: 3.2 mmol/L — ABNORMAL LOW (ref 3.5–5.1)
Sodium: 140 mmol/L (ref 135–145)
Total Bilirubin: 0.5 mg/dL (ref 0.3–1.2)
Total Protein: 7.5 g/dL (ref 6.5–8.1)

## 2022-03-17 LAB — VITAMIN B12: Vitamin B-12: 417 pg/mL (ref 180–914)

## 2022-03-17 LAB — FERRITIN: Ferritin: 3 ng/mL — ABNORMAL LOW (ref 24–336)

## 2022-03-17 MED ORDER — MODAFINIL 200 MG PO TABS: 200.0000 mg | ORAL_TABLET | Freq: Every day | ORAL | 0 refills | Status: DC

## 2022-03-17 MED ORDER — LEVOTHYROXINE SODIUM 150 MCG PO TABS: 150.0000 ug | ORAL_TABLET | Freq: Every day | ORAL | 0 refills | Status: DC

## 2022-03-17 MED ORDER — ALPRAZOLAM 0.5 MG PO TABS: 0.5000 mg | ORAL_TABLET | Freq: Three times a day (TID) | ORAL | 0 refills | Status: DC | PRN

## 2022-03-17 NOTE — Telephone Encounter (Signed)
Refills ordered x1 month, will send additional refills at next visit and discuss levothyroxine then

## 2022-03-17 NOTE — Telephone Encounter (Signed)
Please send next 04/06/22

## 2022-03-17 NOTE — Telephone Encounter (Signed)
Next appt 04/06/22

## 2022-03-18 ENCOUNTER — Inpatient Hospital Stay (HOSPITAL_BASED_OUTPATIENT_CLINIC_OR_DEPARTMENT_OTHER): Payer: No Typology Code available for payment source | Admitting: Oncology

## 2022-03-18 ENCOUNTER — Encounter: Payer: Self-pay | Admitting: Oncology

## 2022-03-18 DIAGNOSIS — D508 Other iron deficiency anemias: Secondary | ICD-10-CM | POA: Diagnosis not present

## 2022-03-18 NOTE — Progress Notes (Signed)
[place]  [phone] [phone]) [phone]  ID: PEDROHENRIQUE MCCONVILLE Younger OB: [birth year]  MR#: [id number]  VPX#:[id number]  Patient Care Team: Jonetta Osgood, NP as PCP - General (Nurse Practitioner) Robert Bellow, MD (General Surgery) Lavera Guise, MD (Internal Medicine) Lloyd Huger, MD as Consulting Physician (Oncology)  I connected with Mayflower Lions Younger on 03/18/22 at  2:45 PM EST by video enabled telemedicine visit and verified that I am speaking with the correct person using two identifiers.   I discussed the limitations, risks, security and privacy concerns of performing an evaluation and management service by telemedicine and the availability of in-person appointments. I also discussed with the patient that there may be a patient responsible charge related to this service. The patient expressed understanding and agreed to proceed.   Other persons participating in the visit and their role in the encounter: Patient, MD.  Patient's location: Home. Provider's location: Clinic.  CHIEF COMPLAINT: Iron deficiency anemia.  INTERVAL HISTORY: Patient agreed to video assisted telemedicine visit for further evaluation and discussion of his laboratory results.  He has noticed increasing weakness and fatigue recently, but otherwise is felt well. He has no neurologic complaints.  He denies any recent fevers or illnesses.  He has no chest pain, shortness of breath, cough, or hemoptysis.  He denies any nausea, vomiting, constipation, or diarrhea.  He has no melena or hematochezia.  He has no urinary complaints.  Patient offers no further specific complaints today.  REVIEW OF SYSTEMS:   Review of Systems  Constitutional:  Positive for malaise/fatigue. Negative for fever and weight loss.  Respiratory: Negative.  Negative for cough and shortness of breath.   Cardiovascular: Negative.  Negative for chest pain and leg swelling.  Gastrointestinal:  Negative.  Negative for abdominal pain, blood in stool and melena.  Genitourinary: Negative.  Negative for hematuria.  Musculoskeletal: Negative.  Negative for back pain and neck pain.  Skin: Negative.  Negative for rash.  Neurological:  Positive for weakness. Negative for dizziness, sensory change, focal weakness and headaches.  Psychiatric/Behavioral: Negative.  The patient is not nervous/anxious.     As per HPI. Otherwise, a complete review of systems is negative.  PAST MEDICAL HISTORY: Past Medical History:  Diagnosis Date   Acute appendicitis 12/04/2017   Allergy    takes allergy shots   Anemia    Arthritis    Asthma    Barrett esophagus    Bronchitis    Chronic pain    Colon polyp    Depression    GERD (gastroesophageal reflux disease)    Hemorrhoid    Hyperlipidemia    Hypertension    Hypothyroid    IBS (irritable bowel syndrome)    Kidney stone    MI (myocardial infarction) ([place])    between 2002 and 2006   Migraine    Pneumonia    Seizures ([place])    Sinus problem    Spinal cord stimulator status     PAST SURGICAL HISTORY: Past Surgical History:  Procedure Laterality Date   ANTERIOR CERVICAL DISCECTOMY     APPENDECTOMY     BACK SURGERY     BACK SURGERY  12/04/2020   COLONOSCOPY  09/2013   elbow surgery     HEMORRHOID SURGERY     LAPAROSCOPIC APPENDECTOMY N/A 12/04/2017   Procedure: APPENDECTOMY LAPAROSCOPIC;  Surgeon: Vickie Epley, MD;  Location: [facility] ORS;  Service: General;  Laterality: N/A;   LIPOMA EXCISION Right  leg   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     SPINAL CORD STIMULATOR IMPLANT     UPPER GI ENDOSCOPY  09/2013   WRIST SURGERY Left 01/14/2021    FAMILY HISTORY Family History  Problem Relation Age of Onset   Hypertension Mother    Lung disease Mother    Heart attack Father    Diabetes Father    Liver cancer Sister    Lung cancer Sister    Colon cancer Sister    Uterine cancer Sister        ADVANCED DIRECTIVES:     HEALTH MAINTENANCE: Social History   Tobacco Use   Smoking status: Former    Packs/day: 1.00    Years: 30.00    Total pack years: 30.00    Types: Cigarettes    Quit date: 02/22/2005    Years since quitting: 17.0   Smokeless tobacco: Never  Vaping Use   Vaping Use: Never used  Substance Use Topics   Alcohol use: Yes    Alcohol/week: 0.0 standard drinks of alcohol    Comment: occasionally   Drug use: No    Comment: past     Colonoscopy:  PAP:  Bone density:  Lipid panel:  Allergies  Allergen Reactions   Amoxicillin Anaphylaxis    REACTION: unspecified   Penicillin G Anaphylaxis   Gabapentin Other (See Comments)    REACTION: anaphylaxis   Penicillins     REACTION: unspecified    Current Outpatient Medications  Medication Sig Dispense Refill   albuterol (VENTOLIN HFA) 108 (90 Base) MCG/ACT inhaler Inhale 2 puffs into the lungs every 4 (four) hours as needed for wheezing or shortness of breath. 8.5 g 3   alfuzosin (UROXATRAL) 10 MG 24 hr tablet TAKE 1 TABLET(10 MG) BY MOUTH DAILY WITH BREAKFAST 90 tablet 1   ALPRAZolam (XANAX) 0.5 MG tablet Take 1 tablet (0.5 mg total) by mouth 3 (three) times daily as needed for anxiety. 90 tablet 0   amLODipine (NORVASC) 5 MG tablet Take 2 tablets (10 mg total) by mouth daily. 90 tablet 1   atorvastatin (LIPITOR) 10 MG tablet TAKE 1 TABLET BY MOUTH DAILY 90 tablet 1   buPROPion (WELLBUTRIN XL) 300 MG 24 hr tablet TAKE 1 TABLET BY MOUTH DAILY, GENERIC EQUIVALENT FOR WELLBUTRIN XL. DUE FOR OFFICE VISIT. 90 tablet 1   chlorpheniramine-HYDROcodone (TUSSIONEX) 10-8 MG/5ML Take 5 mLs by mouth every 12 (twelve) hours as needed. 140 mL 0   citalopram (CELEXA) 20 MG tablet Take 1 tablet (20 mg total) by mouth daily. 90 tablet 1   esomeprazole (NEXIUM) 40 MG capsule TK ONE C PO  BID UTD  0   hydrochlorothiazide (HYDRODIURIL) 12.5 MG tablet Take 1 tablet (12.5 mg total) by mouth daily. 90 tablet 1   levothyroxine (SYNTHROID) 150 MCG tablet  Take 1 tablet (150 mcg total) by mouth daily before breakfast. 30 tablet 0   magnesium gluconate (MAGONATE) 500 MG tablet Take 500 mg by mouth daily.     methocarbamol (ROBAXIN) 750 MG tablet Take 750 mg by mouth 4 (four) times daily as needed.     modafinil (PROVIGIL) 200 MG tablet Take 1 tablet (200 mg total) by mouth daily. 30 tablet 0   mometasone-formoterol (DULERA) 100-5 MCG/ACT AERO INHALE 2 PUFFS BY MOUTH INTO THE LUNGS EVERY 12 HOURS 39 g 3   Multiple Vitamin (MULTI-VITAMINS) TABS Take by mouth.     predniSONE (DELTASONE) 10 MG tablet Take 1 tab po 3 x day for  3 days then take 1 tab po 2 x a day for 3 days and then take 1 tab po daily for 3 days 18 tablet 0   pregabalin (LYRICA) 100 MG capsule Take 100 mg by mouth every 8 (eight) hours.     SYRINGE-NEEDLE, DISP, 3 ML 22G X 1" 3 ML MISC Use as directed.     testosterone cypionate (DEPOTESTOSTERONE CYPIONATE) 200 MG/ML injection INJECT 0.75ML ONCE A WEEK AS DIRECTED 10 mL 1   tiZANidine (ZANAFLEX) 4 MG tablet Take 4 mg by mouth every 6 (six) hours as needed for muscle spasms. Pt takes one tablet at night     valsartan (DIOVAN) 160 MG tablet Take 1 tablet (160 mg total) by mouth daily. 90 tablet 1   No current facility-administered medications for this visit.    OBJECTIVE: There were no vitals filed for this visit.     There is no height or weight on file to calculate BMI.    ECOG FS:0 - Asymptomatic  General: Well-developed, well-nourished, no acute distress. HEENT: Normocephalic. Neuro: Alert, answering all questions appropriately. Cranial nerves grossly intact. Psych: Normal affect.  LAB RESULTS:  Lab Results  Component Value Date   NA 140 03/17/2022   K 3.2 (L) 03/17/2022   CL 101 03/17/2022   CO2 28 03/17/2022   GLUCOSE 87 03/17/2022   BUN 8 03/17/2022   CREATININE 1.13 03/17/2022   CALCIUM 9.1 03/17/2022   PROT 7.5 03/17/2022   ALBUMIN 4.1 03/17/2022   AST 22 03/17/2022   ALT 18 03/17/2022   ALKPHOS 82 03/17/2022    BILITOT 0.5 03/17/2022   GFRNONAA >60 03/17/2022   GFRAA >60 05/05/2019    Lab Results  Component Value Date   WBC 7.1 03/17/2022   NEUTROABS 4.9 03/17/2022   HGB 9.2 (L) 03/17/2022   HCT 33.0 (L) 03/17/2022   MCV 75.5 (L) 03/17/2022   PLT 393 03/17/2022   Lab Results  Component Value Date   IRON 16 (L) 03/17/2022   TIBC 473 (H) 03/17/2022   IRONPCTSAT 3 (L) 03/17/2022   Lab Results  Component Value Date   FERRITIN 3 (L) 03/17/2022     STUDIES: No results found.  ASSESSMENT: Iron deficiency anemia.  PLAN:    1.  Iron deficiency anemia: Patient's hemoglobin and iron stores have significantly decreased and he is symptomatic.  Patient will return to clinic 5 times over the next 2 to 3 weeks to receive 200 mg IV Venofer.  He would then return to clinic in 4 months with repeat laboratory work and video assisted telemedicine visit.   2. Family history of colon cancer:  Patient gets routine colonoscopies given his family history of colon cancer, all of which been unrevealing.  Unclear when his last colonoscopy was completed.  His genetic testing is negative. 3.  Neck pain: Resolved.  Patient underwent cervical vertebrae fusion surgery in fall 2020.  I provided 20 minutes of face-to-face video visit time during this encounter which included chart review, counseling, and coordination of care as documented above.     Patient expressed understanding and was in agreement with this plan. He also understands that He can call clinic at any time with any questions, concerns, or complaints.    Lloyd Huger, MD   03/18/2022 3:18 PM

## 2022-03-18 NOTE — Telephone Encounter (Signed)
Sent/done 

## 2022-03-18 NOTE — Progress Notes (Signed)
Patient scheduled for virtual visit today regarding anemia, patient reports fatigue.

## 2022-03-24 ENCOUNTER — Inpatient Hospital Stay: Payer: No Typology Code available for payment source

## 2022-03-24 VITALS — BP 151/78 | HR 78 | Temp 98.8°F | Resp 15

## 2022-03-24 DIAGNOSIS — D508 Other iron deficiency anemias: Secondary | ICD-10-CM

## 2022-03-24 DIAGNOSIS — D509 Iron deficiency anemia, unspecified: Secondary | ICD-10-CM | POA: Diagnosis not present

## 2022-03-24 MED ORDER — SODIUM CHLORIDE 0.9 % IV SOLN
200.0000 mg | Freq: Once | INTRAVENOUS | Status: AC
  Administered 2022-03-24: 200 mg via INTRAVENOUS
  Filled 2022-03-24: qty 200

## 2022-03-24 NOTE — Patient Instructions (Signed)
Iron Sucrose Injection What is this medication? IRON SUCROSE (EYE ern SOO krose) treats low levels of iron (iron deficiency anemia) in people with kidney disease. Iron is a mineral that plays an important role in making red blood cells, which carry oxygen from your lungs to the rest of your body. This medicine may be used for other purposes; ask your health care provider or pharmacist if you have questions. COMMON BRAND NAME(S): Venofer What should I tell my care team before I take this medication? They need to know if you have any of these conditions: Anemia not caused by low iron levels Heart disease High levels of iron in the blood Kidney disease Liver disease An unusual or allergic reaction to iron, other medications, foods, dyes, or preservatives Pregnant or trying to get pregnant Breastfeeding How should I use this medication? This medication is for infusion into a vein. It is given in a hospital or clinic setting. Talk to your care team about the use of this medication in children. While this medication may be prescribed for children as young as 2 years for selected conditions, precautions do apply. Overdosage: If you think you have taken too much of this medicine contact a poison control center or emergency room at once. NOTE: This medicine is only for you. Do not share this medicine with others. What if I miss a dose? Keep appointments for follow-up doses. It is important not to miss your dose. Call your care team if you are unable to keep an appointment. What may interact with this medication? Do not take this medication with any of the following: Deferoxamine Dimercaprol Other iron products This medication may also interact with the following: Chloramphenicol Deferasirox This list may not describe all possible interactions. Give your health care provider a list of all the medicines, herbs, non-prescription drugs, or dietary supplements you use. Also tell them if you smoke,  drink alcohol, or use illegal drugs. Some items may interact with your medicine. What should I watch for while using this medication? Visit your care team regularly. Tell your care team if your symptoms do not start to get better or if they get worse. You may need blood work done while you are taking this medication. You may need to follow a special diet. Talk to your care team. Foods that contain iron include: whole grains/cereals, dried fruits, beans, or peas, leafy green vegetables, and organ meats (liver, kidney). What side effects may I notice from receiving this medication? Side effects that you should report to your care team as soon as possible: Allergic reactions--skin rash, itching, hives, swelling of the face, lips, tongue, or throat Low blood pressure--dizziness, feeling faint or lightheaded, blurry vision Shortness of breath Side effects that usually do not require medical attention (report to your care team if they continue or are bothersome): Flushing Headache Joint pain Muscle pain Nausea Pain, redness, or irritation at injection site This list may not describe all possible side effects. Call your doctor for medical advice about side effects. You may report side effects to FDA at 1-800-FDA-1088. Where should I keep my medication? This medication is given in a hospital or clinic and will not be stored at home. NOTE: This sheet is a summary. It may not cover all possible information. If you have questions about this medicine, talk to your doctor, pharmacist, or health care provider.  2023 Elsevier/Gold Standard (2020-05-22 00:00:00)

## 2022-03-25 MED FILL — Iron Sucrose Inj 20 MG/ML (Fe Equiv): INTRAVENOUS | Qty: 10 | Status: AC

## 2022-03-26 ENCOUNTER — Inpatient Hospital Stay: Payer: No Typology Code available for payment source | Attending: Oncology

## 2022-03-26 VITALS — BP 121/65 | HR 66 | Temp 97.8°F | Resp 16

## 2022-03-26 DIAGNOSIS — Z79899 Other long term (current) drug therapy: Secondary | ICD-10-CM | POA: Insufficient documentation

## 2022-03-26 DIAGNOSIS — D508 Other iron deficiency anemias: Secondary | ICD-10-CM

## 2022-03-26 DIAGNOSIS — D509 Iron deficiency anemia, unspecified: Secondary | ICD-10-CM | POA: Insufficient documentation

## 2022-03-26 MED ORDER — SODIUM CHLORIDE 0.9 % IV SOLN
200.0000 mg | Freq: Once | INTRAVENOUS | Status: AC
  Administered 2022-03-26: 200 mg via INTRAVENOUS
  Filled 2022-03-26: qty 200

## 2022-03-26 NOTE — Patient Instructions (Signed)

## 2022-03-26 NOTE — Progress Notes (Signed)
Pt was leaving the infusion room post venofer and became very dizzy. Assisted by staff to wheelchair. BP 156/76 p-71. Stated that he felt himself starting to black out. He states that he has been feeling tired and dizzy for a couple of days. Denies any chest pain or shortness of breath. Denies diabetes; states that he ate lunch before arriving to appointment. Assisted to recliner and given beverage. Pt states he now has a headache. He has called family to pick him up. Declined any further intervention or medical assessment. Escorted to vehichle via wheelchair. Informed to seek medical attention if symptoms persisted. Pt verbalized understanding.

## 2022-03-30 ENCOUNTER — Encounter: Payer: Self-pay | Admitting: Oncology

## 2022-03-31 ENCOUNTER — Inpatient Hospital Stay: Payer: No Typology Code available for payment source

## 2022-03-31 VITALS — BP 132/84 | HR 72 | Temp 97.1°F | Resp 18

## 2022-03-31 DIAGNOSIS — D509 Iron deficiency anemia, unspecified: Secondary | ICD-10-CM | POA: Diagnosis not present

## 2022-03-31 DIAGNOSIS — D508 Other iron deficiency anemias: Secondary | ICD-10-CM

## 2022-03-31 MED ORDER — SODIUM CHLORIDE 0.9 % IV SOLN
INTRAVENOUS | Status: DC
  Filled 2022-03-31: qty 250

## 2022-03-31 MED ORDER — SODIUM CHLORIDE 0.9 % IV SOLN
200.0000 mg | Freq: Once | INTRAVENOUS | Status: AC
  Administered 2022-03-31: 200 mg via INTRAVENOUS
  Filled 2022-03-31: qty 200

## 2022-03-31 NOTE — Patient Instructions (Signed)

## 2022-04-01 MED FILL — Iron Sucrose Inj 20 MG/ML (Fe Equiv): INTRAVENOUS | Qty: 10 | Status: AC

## 2022-04-02 ENCOUNTER — Inpatient Hospital Stay: Payer: No Typology Code available for payment source

## 2022-04-02 VITALS — BP 135/62 | HR 72 | Temp 97.0°F | Resp 16

## 2022-04-02 DIAGNOSIS — D508 Other iron deficiency anemias: Secondary | ICD-10-CM

## 2022-04-02 DIAGNOSIS — D509 Iron deficiency anemia, unspecified: Secondary | ICD-10-CM | POA: Diagnosis not present

## 2022-04-02 MED ORDER — SODIUM CHLORIDE 0.9 % IV SOLN
INTRAVENOUS | Status: DC
  Filled 2022-04-02: qty 250

## 2022-04-02 MED ORDER — SODIUM CHLORIDE 0.9 % IV SOLN
200.0000 mg | Freq: Once | INTRAVENOUS | Status: AC
  Administered 2022-04-02: 200 mg via INTRAVENOUS
  Filled 2022-04-02: qty 200

## 2022-04-02 NOTE — Patient Instructions (Signed)

## 2022-04-06 ENCOUNTER — Encounter: Payer: Self-pay | Admitting: Nurse Practitioner

## 2022-04-06 ENCOUNTER — Ambulatory Visit: Payer: No Typology Code available for payment source | Admitting: Nurse Practitioner

## 2022-04-06 ENCOUNTER — Encounter: Payer: Self-pay | Admitting: Oncology

## 2022-04-06 VITALS — BP 131/65 | HR 90 | Temp 97.9°F | Resp 16 | Ht 72.0 in | Wt 215.4 lb

## 2022-04-06 DIAGNOSIS — R7301 Impaired fasting glucose: Secondary | ICD-10-CM

## 2022-04-06 DIAGNOSIS — E039 Hypothyroidism, unspecified: Secondary | ICD-10-CM | POA: Diagnosis not present

## 2022-04-06 DIAGNOSIS — Z125 Encounter for screening for malignant neoplasm of prostate: Secondary | ICD-10-CM

## 2022-04-06 DIAGNOSIS — E782 Mixed hyperlipidemia: Secondary | ICD-10-CM | POA: Diagnosis not present

## 2022-04-06 DIAGNOSIS — N521 Erectile dysfunction due to diseases classified elsewhere: Secondary | ICD-10-CM

## 2022-04-06 DIAGNOSIS — L570 Actinic keratosis: Secondary | ICD-10-CM

## 2022-04-06 DIAGNOSIS — E291 Testicular hypofunction: Secondary | ICD-10-CM

## 2022-04-06 DIAGNOSIS — Z79899 Other long term (current) drug therapy: Secondary | ICD-10-CM

## 2022-04-06 MED ORDER — TADALAFIL 20 MG PO TABS: 10.0000 mg | ORAL_TABLET | Freq: Every day | ORAL | 5 refills | Status: DC | PRN

## 2022-04-06 MED ORDER — HYDROCHLOROTHIAZIDE 12.5 MG PO TABS: 12.5000 mg | ORAL_TABLET | Freq: Every day | ORAL | 1 refills | Status: DC

## 2022-04-06 MED ORDER — MODAFINIL 200 MG PO TABS: 200.0000 mg | ORAL_TABLET | Freq: Every day | ORAL | 2 refills | Status: DC

## 2022-04-06 MED ORDER — ALPRAZOLAM 0.5 MG PO TABS: 0.5000 mg | ORAL_TABLET | Freq: Three times a day (TID) | ORAL | 2 refills | Status: DC | PRN

## 2022-04-06 NOTE — Progress Notes (Signed)
[facility] [place], [place] [address]  Internal MEDICINE  Office Visit Note  Patient Name: Randy Mclean  [id number]  [id number]  Date of Service: 04/06/2022  Chief Complaint  Patient presents with   Follow-up   Depression   Gastroesophageal Reflux   Hypertension   Hyperlipidemia    HPI [name] presents for a follow-up visit for skin problem, cough, erectile dysfunction, and hypothyroidism.  Spot next to left eye -- possible actinic keratosis, not going away.  Erectile dysfunction -- wants to try tadalafil.  Persistent cough -- still producing some sputum that is clear, yellow or green.  Having some palpitations and racing heart rate, hair loss, low energy, and dry skin  Gets shaky and dizzy and low energy, worried about glucose level.     Current Medication: Outpatient Encounter Medications as of 04/06/2022  Medication Sig   albuterol (VENTOLIN HFA) 108 (90 Base) MCG/ACT inhaler Inhale 2 puffs into the lungs every 4 (four) hours as needed for wheezing or shortness of breath.   alfuzosin (UROXATRAL) 10 MG 24 hr tablet TAKE 1 TABLET(10 MG) BY MOUTH DAILY WITH BREAKFAST   amLODipine (NORVASC) 5 MG tablet Take 2 tablets (10 mg total) by mouth daily.   atorvastatin (LIPITOR) 10 MG tablet TAKE 1 TABLET BY MOUTH DAILY   buPROPion (WELLBUTRIN XL) 300 MG 24 hr tablet TAKE 1 TABLET BY MOUTH DAILY, GENERIC EQUIVALENT FOR WELLBUTRIN XL. DUE FOR OFFICE VISIT.   chlorpheniramine-HYDROcodone (TUSSIONEX) 10-8 MG/5ML Take 5 mLs by mouth every 12 (twelve) hours as needed.   citalopram (CELEXA) 20 MG tablet Take 1 tablet (20 mg total) by mouth daily.   esomeprazole (NEXIUM) 40 MG capsule TK ONE C PO  BID UTD   levothyroxine (SYNTHROID) 150 MCG tablet Take 1 tablet (150 mcg total) by mouth daily before breakfast.   magnesium gluconate (MAGONATE) 500 MG tablet Take 500 mg by mouth daily.   methocarbamol (ROBAXIN) 750 MG tablet Take 750 mg by mouth 4 (four) times  daily as needed.   mometasone-formoterol (DULERA) 100-5 MCG/ACT AERO INHALE 2 PUFFS BY MOUTH INTO THE LUNGS EVERY 12 HOURS   Multiple Vitamin (MULTI-VITAMINS) TABS Take by mouth.   predniSONE (DELTASONE) 10 MG tablet Take 1 tab po 3 x day for 3 days then take 1 tab po 2 x a day for 3 days and then take 1 tab po daily for 3 days   pregabalin (LYRICA) 100 MG capsule Take 100 mg by mouth every 8 (eight) hours.   SYRINGE-NEEDLE, DISP, 3 ML 22G X 1" 3 ML MISC Use as directed.   tadalafil (CIALIS) 20 MG tablet Take 0.5-1 tablets (10-20 mg total) by mouth daily as needed for erectile dysfunction.   testosterone cypionate (DEPOTESTOSTERONE CYPIONATE) 200 MG/ML injection INJECT 0.75ML ONCE A WEEK AS DIRECTED   tiZANidine (ZANAFLEX) 4 MG tablet Take 4 mg by mouth every 6 (six) hours as needed for muscle spasms. Pt takes one tablet at night   valsartan (DIOVAN) 160 MG tablet Take 1 tablet (160 mg total) by mouth daily.   [DISCONTINUED] ALPRAZolam (XANAX) 0.5 MG tablet Take 1 tablet (0.5 mg total) by mouth 3 (three) times daily as needed for anxiety.   [DISCONTINUED] hydrochlorothiazide (HYDRODIURIL) 12.5 MG tablet Take 1 tablet (12.5 mg total) by mouth daily.   [DISCONTINUED] modafinil (PROVIGIL) 200 MG tablet Take 1 tablet (200 mg total) by mouth daily.   ALPRAZolam (XANAX) 0.5 MG tablet Take 1 tablet (0.5 mg total) by mouth 3 (  three) times daily as needed for anxiety.   hydrochlorothiazide (HYDRODIURIL) 12.5 MG tablet Take 1 tablet (12.5 mg total) by mouth daily.   modafinil (PROVIGIL) 200 MG tablet Take 1 tablet (200 mg total) by mouth daily.   No facility-administered encounter medications on file as of 04/06/2022.    Surgical History: Past Surgical History:  Procedure Laterality Date   ANTERIOR CERVICAL DISCECTOMY     APPENDECTOMY     BACK SURGERY     BACK SURGERY  12/04/2020   COLONOSCOPY  09/2013   elbow surgery     HEMORRHOID SURGERY     LAPAROSCOPIC APPENDECTOMY N/A 12/04/2017    Procedure: APPENDECTOMY LAPAROSCOPIC;  Surgeon: Vickie Epley, MD;  Location: [facility] ORS;  Service: General;  Laterality: N/A;   LIPOMA EXCISION Right    leg   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     SPINAL CORD STIMULATOR IMPLANT     UPPER GI ENDOSCOPY  09/2013   WRIST SURGERY Left 01/14/2021    Medical History: Past Medical History:  Diagnosis Date   Acute appendicitis 12/04/2017   Allergy    takes allergy shots   Anemia    Arthritis    Asthma    Barrett esophagus    Bronchitis    Chronic pain    Colon polyp    Depression    GERD (gastroesophageal reflux disease)    Hemorrhoid    Hyperlipidemia    Hypertension    Hypothyroid    IBS (irritable bowel syndrome)    Kidney stone    MI (myocardial infarction) ([place])    between 2002 and 2006   Migraine    Pneumonia    Seizures ([place])    Sinus problem    Spinal cord stimulator status     Family History: Family History  Problem Relation Age of Onset   Hypertension Mother    Lung disease Mother    Heart attack Father    Diabetes Father    Liver cancer Sister    Lung cancer Sister    Colon cancer Sister    Uterine cancer Sister     Social History   Socioeconomic History   Marital status: Married    Spouse name: Not on file   Number of children: Not on file   Years of education: Not on file   Highest education level: Not on file  Occupational History   Not on file  Tobacco Use   Smoking status: Former    Packs/day: 1.00    Years: 30.00    Total pack years: 30.00    Types: Cigarettes    Quit date: 02/22/2005    Years since quitting: 17.1   Smokeless tobacco: Never  Vaping Use   Vaping Use: Never used  Substance and Sexual Activity   Alcohol use: Yes    Alcohol/week: 0.0 standard drinks of alcohol    Comment: occasionally   Drug use: No    Comment: past   Sexual activity: Yes  Other Topics Concern   Not on file  Social History Narrative   Not on file   Social Determinants of Health    Financial Resource Strain: Not on file  Food Insecurity: Not on file  Transportation Needs: Not on file  Physical Activity: Not on file  Stress: Not on file  Social Connections: Not on file  Intimate Partner Violence: Not on file      Review of Systems  Constitutional:  Positive for fatigue. Negative for chills and  unexpected weight change.  HENT:  Negative for congestion, rhinorrhea, sneezing and sore throat.   Respiratory:  Negative for cough, chest tightness, shortness of breath and wheezing.   Cardiovascular: Negative.  Negative for chest pain and palpitations.  Gastrointestinal:  Negative for abdominal pain, constipation, diarrhea, nausea and vomiting.  Musculoskeletal:  Positive for arthralgias. Negative for back pain, joint swelling and neck pain.  Skin: Negative.   Neurological:  Negative for dizziness, tremors and numbness.  Hematological:  Negative for adenopathy. Does not bruise/bleed easily.  Psychiatric/Behavioral:  Positive for decreased concentration and sleep disturbance. Negative for behavioral problems (Depression), self-injury and suicidal ideas. The patient is nervous/anxious.     Vital Signs: BP 131/65   Pulse 90   Temp 97.9 F (36.6 C)   Resp 16   Ht 6' (1.829 m)   Wt 215 lb 6.4 oz (97.7 kg)   SpO2 97%   BMI 29.21 kg/m    Physical Exam Vitals reviewed.  Constitutional:      General: He is not in acute distress.    Appearance: Normal appearance. He is not ill-appearing.  HENT:     Head: Normocephalic and atraumatic.  Eyes:     Pupils: Pupils are equal, round, and reactive to light.  Cardiovascular:     Rate and Rhythm: Normal rate and regular rhythm.     Heart sounds: Normal heart sounds. No murmur heard. Pulmonary:     Effort: Pulmonary effort is normal. No respiratory distress.     Breath sounds: Normal breath sounds. No wheezing.  Neurological:     Mental Status: He is alert and oriented to person, place, and time.  Psychiatric:         Mood and Affect: Mood normal.        Behavior: Behavior normal.        Assessment/Plan: 1. Acquired hypothyroidism Thyroid labs and cholesterol labs ordered.  - Lipid Profile - TSH + free T4  2. Actinic keratosis of left temple Referred to dermatology for further evaluation  - Ambulatory referral to Dermatology  3. Impaired fasting glucose Check A1c and fasting glucose - Hgb A1C w/o eAG - Glucose  4. Mixed hyperlipidemia Routine lab ordered - Lipid Profile  5. Testicular hypofunction Labs ordered to monitor testosterone replacement therapy - Testosterone,Free and Total - Hepatic function panel  6. Erectile dysfunction due to diseases classified elsewhere Patient requests cialis, medication prescribed as needed.  - tadalafil (CIALIS) 20 MG tablet; Take 0.5-1 tablets (10-20 mg total) by mouth daily as needed for erectile dysfunction.  Dispense: 10 tablet; Refill: 5  7. Screening for prostate cancer Routine PSA level ordered - PSA Total (Reflex To Free)  8. Encounter for medication review Reviewed med list with patient  - ALPRAZolam (XANAX) 0.5 MG tablet; Take 1 tablet (0.5 mg total) by mouth 3 (three) times daily as needed for anxiety.  Dispense: 90 tablet; Refill: 2 - modafinil (PROVIGIL) 200 MG tablet; Take 1 tablet (200 mg total) by mouth daily.  Dispense: 30 tablet; Refill: 2 - hydrochlorothiazide (HYDRODIURIL) 12.5 MG tablet; Take 1 tablet (12.5 mg total) by mouth daily.  Dispense: 90 tablet; Refill: 1   General Counseling: Aleksey verbalizes understanding of the findings of todays visit and agrees with plan of treatment. I have discussed any further diagnostic evaluation that may be needed or ordered today. We also reviewed his medications today. he has been encouraged to call the office with any questions or concerns that should arise related to todays visit.  Orders Placed This Encounter  Procedures   Lipid Profile   Testosterone,Free and Total   Hepatic  function panel   TSH + free T4   Hgb A1C w/o eAG   Glucose   PSA Total (Reflex To Free)   Ambulatory referral to Dermatology    Meds ordered this encounter  Medications   tadalafil (CIALIS) 20 MG tablet    Sig: Take 0.5-1 tablets (10-20 mg total) by mouth daily as needed for erectile dysfunction.    Dispense:  10 tablet    Refill:  5    New script   ALPRAZolam (XANAX) 0.5 MG tablet    Sig: Take 1 tablet (0.5 mg total) by mouth 3 (three) times daily as needed for anxiety.    Dispense:  90 tablet    Refill:  2   modafinil (PROVIGIL) 200 MG tablet    Sig: Take 1 tablet (200 mg total) by mouth daily.    Dispense:  30 tablet    Refill:  2    refills   hydrochlorothiazide (HYDRODIURIL) 12.5 MG tablet    Sig: Take 1 tablet (12.5 mg total) by mouth daily.    Dispense:  90 tablet    Refill:  1    Return in about 3 months (around 06/28/2022) for F/U, med refill, Neomia Herbel PCP.   Total time spent:30 Minutes Time spent includes review of chart, medications, test results, and follow up plan with the patient.   [place] Controlled Substance Database was reviewed by me.  This patient was seen by Jonetta Osgood, FNP-C in collaboration with Dr. Clayborn Bigness as a part of collaborative care agreement.   Aspen Lawrance R. Valetta Fuller, MSN, FNP-C Internal medicine

## 2022-04-07 ENCOUNTER — Inpatient Hospital Stay: Payer: No Typology Code available for payment source

## 2022-04-07 ENCOUNTER — Telehealth: Payer: Self-pay | Admitting: Nurse Practitioner

## 2022-04-07 VITALS — BP 148/80 | HR 73 | Temp 98.1°F | Resp 18

## 2022-04-07 DIAGNOSIS — D509 Iron deficiency anemia, unspecified: Secondary | ICD-10-CM | POA: Diagnosis not present

## 2022-04-07 DIAGNOSIS — D508 Other iron deficiency anemias: Secondary | ICD-10-CM

## 2022-04-07 MED ORDER — SODIUM CHLORIDE 0.9 % IV SOLN
200.0000 mg | Freq: Once | INTRAVENOUS | Status: AC
  Administered 2022-04-07: 200 mg via INTRAVENOUS
  Filled 2022-04-07: qty 200

## 2022-04-07 MED ORDER — SODIUM CHLORIDE 0.9 % IV SOLN
INTRAVENOUS | Status: DC
  Filled 2022-04-07: qty 250

## 2022-04-07 NOTE — Progress Notes (Signed)
Pt has been educated and understands. Pt refused to stay 30 mins after iron infusion. VSS. Pt stayed 31mns.

## 2022-04-07 NOTE — Telephone Encounter (Signed)
Dermatology referral sent via [facility] to [name]

## 2022-04-28 NOTE — Progress Notes (Signed)
Labs look great, waiting for free testosterone to result. May need to back off on testosterone hormone replacement.

## 2022-04-30 LAB — HEPATIC FUNCTION PANEL
ALT: 20 IU/L (ref 0–44)
AST: 21 IU/L (ref 0–40)
Albumin: 4.3 g/dL (ref 3.8–4.9)
Alkaline Phosphatase: 91 IU/L (ref 44–121)
Bilirubin Total: 0.2 mg/dL (ref 0.0–1.2)
Bilirubin, Direct: <0.1 mg/dL (ref 0.00–0.40)
Total Protein: 6.6 g/dL (ref 6.0–8.5)

## 2022-04-30 LAB — LIPID PANEL
Chol/HDL Ratio: 3.2 ratio (ref 0.0–5.0)
Cholesterol, Total: 158 mg/dL (ref 100–199)
HDL: 49 mg/dL (ref >39)
LDL Chol Calc (NIH): 91 mg/dL (ref 0–99)
Triglycerides: 96 mg/dL (ref 0–149)
VLDL Cholesterol Cal: 18 mg/dL (ref 5–40)

## 2022-04-30 LAB — HGB A1C W/O EAG: Hgb A1c MFr Bld: 5.2 % (ref 4.8–5.6)

## 2022-04-30 LAB — PSA TOTAL (REFLEX TO FREE): Prostate Specific Ag, Serum: 3 ng/mL (ref 0.0–4.0)

## 2022-04-30 LAB — GLUCOSE, RANDOM: Glucose: 97 mg/dL (ref 70–99)

## 2022-04-30 LAB — TESTOSTERONE,FREE AND TOTAL
Testosterone, Free: 16.8 pg/mL (ref 7.2–24.0)
Testosterone: 1045 ng/dL — ABNORMAL HIGH (ref 264–916)

## 2022-04-30 LAB — TSH+FREE T4
Free T4: 1.25 ng/dL (ref 0.82–1.77)
TSH: 0.13 u[IU]/mL — ABNORMAL LOW (ref 0.450–4.500)

## 2022-05-01 ENCOUNTER — Other Ambulatory Visit: Payer: Self-pay | Admitting: Nurse Practitioner

## 2022-05-01 DIAGNOSIS — E039 Hypothyroidism, unspecified: Secondary | ICD-10-CM

## 2022-05-05 ENCOUNTER — Encounter: Payer: Self-pay | Admitting: Oncology

## 2022-05-07 ENCOUNTER — Inpatient Hospital Stay
Admission: EM | Admit: 2022-05-07 | Discharge: 2022-05-10 | DRG: 378 | Disposition: A | Discharge status: Home / Self Care | Payer: No Typology Code available for payment source | Attending: Obstetrics and Gynecology | Admitting: Obstetrics and Gynecology

## 2022-05-07 ENCOUNTER — Emergency Department: Payer: No Typology Code available for payment source

## 2022-05-07 ENCOUNTER — Other Ambulatory Visit: Payer: Self-pay

## 2022-05-07 DIAGNOSIS — Z9682 Presence of neurostimulator: Secondary | ICD-10-CM

## 2022-05-07 DIAGNOSIS — K219 Gastro-esophageal reflux disease without esophagitis: Secondary | ICD-10-CM | POA: Diagnosis present

## 2022-05-07 DIAGNOSIS — G47419 Narcolepsy without cataplexy: Secondary | ICD-10-CM | POA: Diagnosis present

## 2022-05-07 DIAGNOSIS — G8929 Other chronic pain: Secondary | ICD-10-CM | POA: Diagnosis present

## 2022-05-07 DIAGNOSIS — K921 Melena: Secondary | ICD-10-CM | POA: Diagnosis present

## 2022-05-07 DIAGNOSIS — Z79899 Other long term (current) drug therapy: Secondary | ICD-10-CM

## 2022-05-07 DIAGNOSIS — R079 Chest pain, unspecified: Secondary | ICD-10-CM

## 2022-05-07 DIAGNOSIS — I129 Hypertensive chronic kidney disease with stage 1 through stage 4 chronic kidney disease, or unspecified chronic kidney disease: Secondary | ICD-10-CM | POA: Diagnosis present

## 2022-05-07 DIAGNOSIS — Z8719 Personal history of other diseases of the digestive system: Secondary | ICD-10-CM

## 2022-05-07 DIAGNOSIS — N400 Enlarged prostate without lower urinary tract symptoms: Secondary | ICD-10-CM | POA: Diagnosis present

## 2022-05-07 DIAGNOSIS — I252 Old myocardial infarction: Secondary | ICD-10-CM

## 2022-05-07 DIAGNOSIS — Z880 Allergy status to penicillin: Secondary | ICD-10-CM

## 2022-05-07 DIAGNOSIS — Z87442 Personal history of urinary calculi: Secondary | ICD-10-CM

## 2022-05-07 DIAGNOSIS — D509 Iron deficiency anemia, unspecified: Secondary | ICD-10-CM | POA: Diagnosis not present

## 2022-05-07 DIAGNOSIS — K922 Gastrointestinal hemorrhage, unspecified: Secondary | ICD-10-CM

## 2022-05-07 DIAGNOSIS — R519 Headache, unspecified: Secondary | ICD-10-CM | POA: Diagnosis not present

## 2022-05-07 DIAGNOSIS — Z9049 Acquired absence of other specified parts of digestive tract: Secondary | ICD-10-CM

## 2022-05-07 DIAGNOSIS — Z23 Encounter for immunization: Secondary | ICD-10-CM

## 2022-05-07 DIAGNOSIS — E785 Hyperlipidemia, unspecified: Secondary | ICD-10-CM | POA: Diagnosis present

## 2022-05-07 DIAGNOSIS — D649 Anemia, unspecified: Secondary | ICD-10-CM | POA: Diagnosis not present

## 2022-05-07 DIAGNOSIS — G47429 Narcolepsy in conditions classified elsewhere without cataplexy: Secondary | ICD-10-CM | POA: Diagnosis present

## 2022-05-07 DIAGNOSIS — J4489 Other specified chronic obstructive pulmonary disease: Secondary | ICD-10-CM | POA: Diagnosis present

## 2022-05-07 DIAGNOSIS — Z1152 Encounter for screening for COVID-19: Secondary | ICD-10-CM

## 2022-05-07 DIAGNOSIS — I10 Essential (primary) hypertension: Secondary | ICD-10-CM | POA: Diagnosis present

## 2022-05-07 DIAGNOSIS — K589 Irritable bowel syndrome without diarrhea: Secondary | ICD-10-CM | POA: Diagnosis present

## 2022-05-07 DIAGNOSIS — Z981 Arthrodesis status: Secondary | ICD-10-CM

## 2022-05-07 DIAGNOSIS — I2089 Other forms of angina pectoris: Secondary | ICD-10-CM | POA: Diagnosis present

## 2022-05-07 DIAGNOSIS — K2289 Other specified disease of esophagus: Secondary | ICD-10-CM | POA: Diagnosis present

## 2022-05-07 DIAGNOSIS — K2270 Barrett's esophagus without dysplasia: Secondary | ICD-10-CM | POA: Diagnosis present

## 2022-05-07 DIAGNOSIS — G40909 Epilepsy, unspecified, not intractable, without status epilepticus: Secondary | ICD-10-CM | POA: Diagnosis present

## 2022-05-07 DIAGNOSIS — K3182 Dieulafoy lesion (hemorrhagic) of stomach and duodenum: Secondary | ICD-10-CM | POA: Diagnosis not present

## 2022-05-07 DIAGNOSIS — N183 Chronic kidney disease, stage 3 unspecified: Secondary | ICD-10-CM | POA: Diagnosis present

## 2022-05-07 DIAGNOSIS — Z888 Allergy status to other drugs, medicaments and biological substances status: Secondary | ICD-10-CM

## 2022-05-07 DIAGNOSIS — Z79890 Hormone replacement therapy (postmenopausal): Secondary | ICD-10-CM

## 2022-05-07 DIAGNOSIS — Z7951 Long term (current) use of inhaled steroids: Secondary | ICD-10-CM

## 2022-05-07 DIAGNOSIS — J449 Chronic obstructive pulmonary disease, unspecified: Secondary | ICD-10-CM | POA: Diagnosis present

## 2022-05-07 DIAGNOSIS — I25118 Atherosclerotic heart disease of native coronary artery with other forms of angina pectoris: Secondary | ICD-10-CM | POA: Diagnosis present

## 2022-05-07 DIAGNOSIS — Z87891 Personal history of nicotine dependence: Secondary | ICD-10-CM

## 2022-05-07 DIAGNOSIS — K5730 Diverticulosis of large intestine without perforation or abscess without bleeding: Secondary | ICD-10-CM | POA: Diagnosis present

## 2022-05-07 DIAGNOSIS — K641 Second degree hemorrhoids: Secondary | ICD-10-CM | POA: Diagnosis present

## 2022-05-07 DIAGNOSIS — Z8249 Family history of ischemic heart disease and other diseases of the circulatory system: Secondary | ICD-10-CM

## 2022-05-07 DIAGNOSIS — F32A Depression, unspecified: Secondary | ICD-10-CM | POA: Diagnosis present

## 2022-05-07 DIAGNOSIS — E063 Autoimmune thyroiditis: Secondary | ICD-10-CM | POA: Diagnosis present

## 2022-05-07 LAB — COMPREHENSIVE METABOLIC PANEL
ALT: 17 U/L (ref 0–44)
AST: 21 U/L (ref 15–41)
Albumin: 4.2 g/dL (ref 3.5–5.0)
Alkaline Phosphatase: 63 U/L (ref 38–126)
Anion gap: 10 (ref 5–15)
BUN: 11 mg/dL (ref 6–20)
CO2: 29 mmol/L (ref 22–32)
Calcium: 9.2 mg/dL (ref 8.9–10.3)
Chloride: 102 mmol/L (ref 98–111)
Creatinine, Ser: 1.16 mg/dL (ref 0.61–1.24)
GFR, Estimated: >60 mL/min (ref >60)
Glucose, Bld: 94 mg/dL (ref 70–99)
Potassium: 3.2 mmol/L — ABNORMAL LOW (ref 3.5–5.1)
Sodium: 141 mmol/L (ref 135–145)
Total Bilirubin: 0.5 mg/dL (ref 0.3–1.2)
Total Protein: 7.1 g/dL (ref 6.5–8.1)

## 2022-05-07 LAB — RESP PANEL BY RT-PCR (RSV, FLU A&B, COVID)  RVPGX2
Influenza A by PCR: NEGATIVE
Influenza B by PCR: NEGATIVE
Resp Syncytial Virus by PCR: NEGATIVE
SARS Coronavirus 2 by RT PCR: NEGATIVE

## 2022-05-07 LAB — PREPARE RBC (CROSSMATCH)

## 2022-05-07 LAB — CBC
HCT: 27.6 % — ABNORMAL LOW (ref 39.0–52.0)
Hemoglobin: 7.6 g/dL — ABNORMAL LOW (ref 13.0–17.0)
MCH: 20.7 pg — ABNORMAL LOW (ref 26.0–34.0)
MCHC: 27.5 g/dL — ABNORMAL LOW (ref 30.0–36.0)
MCV: 75 fL — ABNORMAL LOW (ref 80.0–100.0)
Platelets: 320 10*3/uL (ref 150–400)
RBC: 3.68 MIL/uL — ABNORMAL LOW (ref 4.22–5.81)
RDW: 21.2 % — ABNORMAL HIGH (ref 11.5–15.5)
WBC: 6 10*3/uL (ref 4.0–10.5)
nRBC: 0 % (ref 0.0–0.2)

## 2022-05-07 LAB — TROPONIN I (HIGH SENSITIVITY)
Troponin I (High Sensitivity): 7 ng/L (ref <18)
Troponin I (High Sensitivity): 7 ng/L (ref <18)

## 2022-05-07 LAB — IRON AND TIBC
Iron: 20 ug/dL — ABNORMAL LOW (ref 45–182)
Saturation Ratios: 4 % — ABNORMAL LOW (ref 17.9–39.5)
TIBC: 466 ug/dL — ABNORMAL HIGH (ref 250–450)
UIBC: 446 ug/dL

## 2022-05-07 LAB — FERRITIN: Ferritin: 7 ng/mL — ABNORMAL LOW (ref 24–336)

## 2022-05-07 LAB — LIPASE, BLOOD: Lipase: 40 U/L (ref 11–51)

## 2022-05-07 LAB — ABO/RH: ABO/RH(D): O POS

## 2022-05-07 MED ORDER — ALFUZOSIN HCL ER 10 MG PO TB24
10.0000 mg | ORAL_TABLET | Freq: Every day | ORAL | Status: DC
  Administered 2022-05-08 – 2022-05-09 (×2): 10 mg via ORAL
  Filled 2022-05-07 (×3): qty 1

## 2022-05-07 MED ORDER — BUPROPION HCL ER (XL) 300 MG PO TB24
300.0000 mg | ORAL_TABLET | Freq: Every day | ORAL | Status: DC
  Filled 2022-05-07: qty 1

## 2022-05-07 MED ORDER — MOMETASONE FURO-FORMOTEROL FUM 100-5 MCG/ACT IN AERO
2.0000 | INHALATION_SPRAY | Freq: Two times a day (BID) | RESPIRATORY_TRACT | Status: DC
  Administered 2022-05-08: 2 via RESPIRATORY_TRACT
  Filled 2022-05-07: qty 8.8

## 2022-05-07 MED ORDER — PANTOPRAZOLE INFUSION (NEW) - SIMPLE MED
8.0000 mg/h | INTRAVENOUS | Status: DC
  Administered 2022-05-07 – 2022-05-10 (×6): 8 mg/h via INTRAVENOUS
  Filled 2022-05-07 (×6): qty 100

## 2022-05-07 MED ORDER — PNEUMOCOCCAL VAC POLYVALENT 25 MCG/0.5ML IJ INJ
0.5000 mL | INJECTION | INTRAMUSCULAR | Status: AC
  Administered 2022-05-10: 0.5 mL via INTRAMUSCULAR
  Filled 2022-05-07 (×2): qty 0.5

## 2022-05-07 MED ORDER — HYDROCHLOROTHIAZIDE 12.5 MG PO TABS
12.5000 mg | ORAL_TABLET | Freq: Every day | ORAL | Status: DC
  Administered 2022-05-08 – 2022-05-10 (×2): 12.5 mg via ORAL
  Filled 2022-05-07 (×2): qty 1

## 2022-05-07 MED ORDER — ATORVASTATIN CALCIUM 20 MG PO TABS
10.0000 mg | ORAL_TABLET | Freq: Every day | ORAL | Status: DC
  Administered 2022-05-07 – 2022-05-10 (×4): 10 mg via ORAL
  Filled 2022-05-07 (×4): qty 1

## 2022-05-07 MED ORDER — TIZANIDINE HCL 4 MG PO TABS
4.0000 mg | ORAL_TABLET | Freq: Four times a day (QID) | ORAL | Status: DC | PRN
  Administered 2022-05-08: 4 mg via ORAL
  Filled 2022-05-07 (×2): qty 1

## 2022-05-07 MED ORDER — SODIUM CHLORIDE 0.9 % IV SOLN: 10.0000 mL/h | Freq: Once | INTRAVENOUS | Status: DC

## 2022-05-07 MED ORDER — LIDOCAINE VISCOUS HCL 2 % MT SOLN
15.0000 mL | Freq: Once | OROMUCOSAL | Status: AC
  Administered 2022-05-07: 15 mL via OROMUCOSAL
  Filled 2022-05-07: qty 15

## 2022-05-07 MED ORDER — ALBUTEROL SULFATE (2.5 MG/3ML) 0.083% IN NEBU: 2.5000 mg | INHALATION_SOLUTION | RESPIRATORY_TRACT | Status: DC | PRN

## 2022-05-07 MED ORDER — AMLODIPINE BESYLATE 10 MG PO TABS
10.0000 mg | ORAL_TABLET | Freq: Every day | ORAL | Status: DC
  Filled 2022-05-07: qty 1

## 2022-05-07 MED ORDER — CITALOPRAM HYDROBROMIDE 20 MG PO TABS
20.0000 mg | ORAL_TABLET | Freq: Every day | ORAL | Status: DC
  Filled 2022-05-07: qty 1

## 2022-05-07 MED ORDER — LEVOTHYROXINE SODIUM 50 MCG PO TABS
150.0000 ug | ORAL_TABLET | Freq: Every day | ORAL | Status: DC
  Administered 2022-05-08 – 2022-05-10 (×2): 150 ug via ORAL
  Filled 2022-05-07 (×3): qty 1

## 2022-05-07 MED ORDER — IRBESARTAN 150 MG PO TABS: 150.0000 mg | ORAL_TABLET | Freq: Every day | ORAL | Status: DC

## 2022-05-07 MED ORDER — ONDANSETRON HCL 4 MG PO TABS: 4.0000 mg | ORAL_TABLET | Freq: Four times a day (QID) | ORAL | Status: DC | PRN

## 2022-05-07 MED ORDER — SODIUM CHLORIDE 0.9% FLUSH
3.0000 mL | Freq: Two times a day (BID) | INTRAVENOUS | Status: DC
  Administered 2022-05-08 – 2022-05-10 (×5): 3 mL via INTRAVENOUS

## 2022-05-07 MED ORDER — PANTOPRAZOLE 80MG IVPB - SIMPLE MED
80.0000 mg | Freq: Once | INTRAVENOUS | Status: AC
  Administered 2022-05-07: 80 mg via INTRAVENOUS
  Filled 2022-05-07: qty 100

## 2022-05-07 MED ORDER — ACETAMINOPHEN 650 MG RE SUPP: 650.0000 mg | Freq: Four times a day (QID) | RECTAL | Status: DC | PRN

## 2022-05-07 MED ORDER — ONDANSETRON HCL 4 MG/2ML IJ SOLN
4.0000 mg | Freq: Four times a day (QID) | INTRAMUSCULAR | Status: DC | PRN
  Administered 2022-05-08: 4 mg via INTRAVENOUS
  Filled 2022-05-07: qty 2

## 2022-05-07 MED ORDER — PREGABALIN 50 MG PO CAPS
100.0000 mg | ORAL_CAPSULE | Freq: Three times a day (TID) | ORAL | Status: DC
  Administered 2022-05-07 – 2022-05-10 (×7): 100 mg via ORAL
  Filled 2022-05-07 (×8): qty 2

## 2022-05-07 MED ORDER — ALBUTEROL SULFATE HFA 108 (90 BASE) MCG/ACT IN AERS: 2.0000 | INHALATION_SPRAY | RESPIRATORY_TRACT | Status: DC | PRN

## 2022-05-07 MED ORDER — ALUM & MAG HYDROXIDE-SIMETH 200-200-20 MG/5ML PO SUSP
30.0000 mL | Freq: Once | ORAL | Status: AC
  Administered 2022-05-07: 30 mL via ORAL
  Filled 2022-05-07: qty 30

## 2022-05-07 MED ORDER — METHOCARBAMOL 500 MG PO TABS
750.0000 mg | ORAL_TABLET | Freq: Four times a day (QID) | ORAL | Status: DC | PRN
  Administered 2022-05-07 – 2022-05-10 (×5): 750 mg via ORAL
  Filled 2022-05-07 (×5): qty 2

## 2022-05-07 MED ORDER — ACETAMINOPHEN 325 MG PO TABS
650.0000 mg | ORAL_TABLET | Freq: Four times a day (QID) | ORAL | Status: DC | PRN
  Administered 2022-05-09 – 2022-05-10 (×2): 650 mg via ORAL
  Filled 2022-05-07 (×2): qty 2

## 2022-05-07 MED ORDER — ALPRAZOLAM 0.5 MG PO TABS
0.5000 mg | ORAL_TABLET | Freq: Three times a day (TID) | ORAL | Status: DC | PRN
  Administered 2022-05-07 – 2022-05-09 (×4): 0.5 mg via ORAL
  Filled 2022-05-07 (×4): qty 1

## 2022-05-07 NOTE — Assessment & Plan Note (Signed)
-  Continue home bronchodilators 

## 2022-05-07 NOTE — Assessment & Plan Note (Signed)
-   Holding home modafinil in the setting of chest pain

## 2022-05-07 NOTE — Assessment & Plan Note (Signed)
Continue home levothyroxine 

## 2022-05-07 NOTE — Assessment & Plan Note (Signed)
Patient presenting with several week history of black stool and occasional bright red stool that comes out as urgent diarrhea consistent with upper GI source.  Patient has been states that GI bleeds are chronic and patient has had colonoscopies and endoscopies that have been unrevealing.  It appears a capsule study was scheduled but not completed in 2022.  Patient may benefit from capsule endoscopy versus double-balloon enteroscopy to evaluate for AVM.  - GI consulted; appreciate their recommendations - Continue Protonix infusion initiated in ED - Clear liquid diet - N.p.o. after midnight

## 2022-05-07 NOTE — Assessment & Plan Note (Signed)
-   Restart home amlodipine and valsartan tomorrow

## 2022-05-07 NOTE — Assessment & Plan Note (Signed)
Patient presenting with exertional chest pain and shortness of breath consistent with symptomatic anemia.  Last CBC check was 1 month ago with hemoglobin of 9.2, currently 7.6.  - Telemetry monitoring - 1 unit of packed RBCs ordered - Check posttransfusion CBC 2 hours after completion - Continue to transfuse if remains symptomatic or hemoglobin < 7

## 2022-05-07 NOTE — ED Triage Notes (Signed)
Pt in via POV from [facility] c/o chest pain x2 weeks that has been worsening with indigestion starting today. Pts states that he has been have dyspnea on exertion. Pt has hx of HTN. Pt states that left side of chest hurst at rest and both sides hurt when walking. Pt states it "feels like my lungs are hurting" when walking. Pt states a consistent headache for the past week.

## 2022-05-07 NOTE — Assessment & Plan Note (Signed)
Patient has a long-term history of iron deficiency anemia dating back to 2018 for which he follows with heme-onc.  Patient's husband was concern for possible pernicious anemia, however B12 levels have always been within normal limits so less likely.   -Iron panel and ferritin pending

## 2022-05-07 NOTE — H&P (Signed)
History and Physical    Patient: Randy Mclean DOB: 04/09/1960 DOA: 05/07/2022 DOS: the patient was seen and examined on 05/07/2022 PCP: Jonetta Osgood, NP  Patient coming from: Home  Chief Complaint:  Chief Complaint  Patient presents with   Chest Pain   HPI: Randy Mclean is a 62 y.o. male with medical history significant of hypertension, hyperlipidemia, iron deficiency anemia on IV iron, MI (age of 69), hypothyroidism, seizures, who presents to the ED due to chest pain.  Randy Mclean states that for the last 3 weeks, he has been experiencing persistent substernal chest pain that occurs when he is ambulating or exerting self. Symptoms improve with rest. The chest pain radiates up to his left shoulder.  While he is experiencing chest pain, he will also have shortness of breath, dizziness and vision changes.  He denies any LOC.   Randy Mclean also notes that he has been experiencing both black and bright red stools for several weeks now. He has a prior history of anemia and GI bleeds with both endoscopy and colonoscopy that have not been revealing.  He notes that most recently, he has intermittent black stool and occasionally bright red stool that will come out as diarrhea with stool in the bowl and mixed in.  No nausea, vomiting. He denies any recent NSAID use. He notes a history of chronic iron deficiency anemia of uncertain etiology for which she requires iron transfusions every 4 to 6 months.   ED course: On arrival to the ED, patient was hypertensive at 159/83 with heart rate of 76.  He was saturating at 97% on room air.  He was afebrile at 98.4.  Initial workup notable for hemoglobin of 7.6, MCV of 75, potassium of 3.2, creatinine 1.16 with GFR above 60, and troponin of 7.  COVID-19, influenza and RSV PCR negative. Chest x-ray with no acute cardiopulmonary disease.  Due to concern for symptomatic anemia, patient started on Protonix infusion, 1 unit of packed  RBC ordered and [facility] contacted for admission.  Review of Systems: As mentioned in the history of present illness. All other systems reviewed and are negative.  Past Medical History:  Diagnosis Date   Acute appendicitis 12/04/2017   Allergy    takes allergy shots   Anemia    Arthritis    Asthma    Barrett esophagus    Bronchitis    Chronic pain    Colon polyp    Depression    GERD (gastroesophageal reflux disease)    Hemorrhoid    Hyperlipidemia    Hypertension    Hypothyroid    IBS (irritable bowel syndrome)    Kidney stone    MI (myocardial infarction) ([place])    between 2002 and 2006   Migraine    Pneumonia    Seizures ([place])    Sinus problem    Spinal cord stimulator status    Past Surgical History:  Procedure Laterality Date   ANTERIOR CERVICAL DISCECTOMY     APPENDECTOMY     BACK SURGERY     BACK SURGERY  12/04/2020   COLONOSCOPY  09/2013   elbow surgery     HEMORRHOID SURGERY     LAPAROSCOPIC APPENDECTOMY N/A 12/04/2017   Procedure: APPENDECTOMY LAPAROSCOPIC;  Surgeon: Vickie Epley, MD;  Location: [facility] ORS;  Service: General;  Laterality: N/A;   LIPOMA EXCISION Right    leg   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     SPINAL CORD STIMULATOR  IMPLANT     UPPER GI ENDOSCOPY  09/2013   WRIST SURGERY Left 01/14/2021   Social History:  reports that he quit smoking about 17 years ago. His smoking use included cigarettes. He has a 30.00 pack-year smoking history. He has never used smokeless tobacco. He reports current alcohol use. He reports that he does not use drugs.  Allergies  Allergen Reactions   Amoxicillin Anaphylaxis    REACTION: unspecified   Penicillin G Anaphylaxis   Gabapentin Other (See Comments)    REACTION: anaphylaxis   Penicillins     REACTION: unspecified    Family History  Problem Relation Age of Onset   Hypertension Mother    Lung disease Mother    Heart attack Father    Diabetes Father    Liver cancer Sister    Lung cancer  Sister    Colon cancer Sister    Uterine cancer Sister     Prior to Admission medications   Medication Sig Start Date End Date Taking? Authorizing Provider  albuterol (VENTOLIN HFA) 108 (90 Base) MCG/ACT inhaler Inhale 2 puffs into the lungs every 4 (four) hours as needed for wheezing or shortness of breath. 03/16/19   Ronnell Freshwater, NP  alfuzosin (UROXATRAL) 10 MG 24 hr tablet TAKE 1 TABLET(10 MG) BY MOUTH DAILY WITH BREAKFAST 10/15/21   Lavera Guise, MD  ALPRAZolam Duanne Moron) 0.5 MG tablet Take 1 tablet (0.5 mg total) by mouth 3 (three) times daily as needed for anxiety. 04/06/22   Jonetta Osgood, NP  amLODipine (NORVASC) 5 MG tablet Take 2 tablets (10 mg total) by mouth daily. 01/07/22   Jonetta Osgood, NP  atorvastatin (LIPITOR) 10 MG tablet TAKE 1 TABLET BY MOUTH DAILY 12/17/21   Abernathy, Yetta Flock, NP  buPROPion (WELLBUTRIN XL) 300 MG 24 hr tablet TAKE 1 TABLET BY MOUTH DAILY, GENERIC EQUIVALENT FOR WELLBUTRIN XL. DUE FOR OFFICE VISIT. 01/07/22   Jonetta Osgood, NP  chlorpheniramine-HYDROcodone (TUSSIONEX) 10-8 MG/5ML Take 5 mLs by mouth every 12 (twelve) hours as needed. 02/01/22   Jonetta Osgood, NP  citalopram (CELEXA) 20 MG tablet Take 1 tablet (20 mg total) by mouth daily. 01/07/22   Jonetta Osgood, NP  esomeprazole (NEXIUM) 40 MG capsule TK ONE C PO  BID UTD 03/15/16   [provider]  hydrochlorothiazide (HYDRODIURIL) 12.5 MG tablet Take 1 tablet (12.5 mg total) by mouth daily. 04/06/22   Jonetta Osgood, NP  levothyroxine (SYNTHROID) 150 MCG tablet TAKE 1 TABLET BY MOUTH DAILY BEFORE BREAKFAST. 05/03/22   Jonetta Osgood, NP  magnesium gluconate (MAGONATE) 500 MG tablet Take 500 mg by mouth daily.    [provider]  methocarbamol (ROBAXIN) 750 MG tablet Take 750 mg by mouth 4 (four) times daily as needed. 06/20/19   [provider]  modafinil (PROVIGIL) 200 MG tablet Take 1 tablet (200 mg total) by mouth daily. 04/06/22   Jonetta Osgood, NP   mometasone-formoterol (DULERA) 100-5 MCG/ACT AERO INHALE 2 PUFFS BY MOUTH INTO THE LUNGS EVERY 12 HOURS 01/07/22   Jonetta Osgood, NP  Multiple Vitamin (MULTI-VITAMINS) TABS Take by mouth.    [provider]  predniSONE (DELTASONE) 10 MG tablet Take 1 tab po 3 x day for 3 days then take 1 tab po 2 x a day for 3 days and then take 1 tab po daily for 3 days 02/12/22   Jonetta Osgood, NP  pregabalin (LYRICA) 100 MG capsule Take 100 mg by mouth every 8 (eight) hours. 01/29/20   [provider]  SYRINGE-NEEDLE, DISP, 3 ML 22G X 1" 3 ML MISC Use as directed. 02/17/15   [provider]  tadalafil (CIALIS) 20 MG tablet Take 0.5-1 tablets (10-20 mg total) by mouth daily as needed for erectile dysfunction. 04/06/22   Jonetta Osgood, NP  testosterone cypionate (DEPOTESTOSTERONE CYPIONATE) 200 MG/ML injection INJECT 0.75ML ONCE A WEEK AS DIRECTED 03/18/22   Lavera Guise, MD  tiZANidine (ZANAFLEX) 4 MG tablet Take 4 mg by mouth every 6 (six) hours as needed for muscle spasms. Pt takes one tablet at night    [provider]  valsartan (DIOVAN) 160 MG tablet Take 1 tablet (160 mg total) by mouth daily. 01/07/22   Jonetta Osgood, NP    Physical Exam: Vitals:   05/07/22 1630 05/07/22 1700 05/07/22 1800 05/07/22 1841  BP: (!) 155/84 (!) 170/88 (!) 169/81 (!) 158/82  Pulse: 74 70 64 66  Resp: 16 15 13 16   Temp:    98.2 F (36.8 C)  TempSrc:      SpO2: 99% 100% 100% 100%  Weight:      Height:       Physical Exam Vitals and nursing note reviewed.  Constitutional:      General: He is not in acute distress.    Appearance: He is normal weight. He is not toxic-appearing.  HENT:     Head: Normocephalic and atraumatic.  Eyes:     Extraocular Movements: Extraocular movements intact.     Pupils: Pupils are equal, round, and reactive to light.  Cardiovascular:     Rate and Rhythm: Normal rate and regular rhythm.     Heart sounds: No murmur heard.    Comments:  Trace bilateral pitting edema.  2+ pulses throughout Pulmonary:     Effort: Pulmonary effort is normal. No tachypnea or respiratory distress.     Breath sounds: Normal breath sounds.  Abdominal:     General: Bowel sounds are normal.     Palpations: Abdomen is soft.     Tenderness: There is no abdominal tenderness.  Neurological:     General: No focal deficit present.     Mental Status: He is alert and oriented to person, place, and time.  Psychiatric:        Mood and Affect: Mood normal.        Behavior: Behavior normal.    Data Reviewed: CBC with WBC of 6.0, hemoglobin of 7.6, MCV of 75, and platelets of 320 CMP with sodium of 141, potassium 3.2, bicarb 29, glucose 94, creatinine 1.16, anion gap 10, AST 21, ALT 17 and GFR above 60 Initial troponin negative at 7 COVID-19, influenza and RSV PCR negative  EKG personally reviewed.  Sinus rhythm with rate of 73.  No ST or T wave changes consistent with acute ischemia.  DG Chest Portable 1 View  Result Date: 05/07/2022 CLINICAL DATA:  Provided history: Shortness of breath. EXAM: PORTABLE CHEST 1 VIEW COMPARISON:  Prior chest radiographs 06/19/2021 and earlier. FINDINGS: Heart size within normal limits. Aortic atherosclerosis. No appreciable airspace consolidation. No evidence of pleural effusion or pneumothorax. No acute osseous abnormality identified. Degenerative changes of the spine. Spinal stimulator leads. Partially imaged ACDF hardware. IMPRESSION: No evidence of acute cardiopulmonary abnormality. Aortic Atherosclerosis (ICD10-I70.0). Electronically Signed   By: Kellie Simmering D.O.   On: 05/07/2022 16:19    There are no new results to review at this time.  Assessment and Plan:  * Symptomatic anemia Patient presenting with exertional chest pain and shortness of breath consistent  with symptomatic anemia.  Last CBC check was 1 month ago with hemoglobin of 9.2, currently 7.6.  - Telemetry monitoring - 1 unit of packed RBCs ordered -  Check posttransfusion CBC 2 hours after completion - Continue to transfuse if remains symptomatic or hemoglobin < 7  Upper GI bleed Patient presenting with several week history of black stool and occasional bright red stool that comes out as urgent diarrhea consistent with upper GI source.  Patient has been states that GI bleeds are chronic and patient has had colonoscopies and endoscopies that have been unrevealing.  It appears a capsule study was scheduled but not completed in 2022.  Patient may benefit from capsule endoscopy versus double-balloon enteroscopy to evaluate for AVM.  - GI consulted; appreciate their recommendations - Continue Protonix infusion initiated in ED - Clear liquid diet - N.p.o. after midnight  Stable angina Patient endorses a 3-week history of chest pain, shortness of breath and dizziness with exertion only, however during examination, patient was having some short bursts of chest pain while resting.  EKG with no concerning findings and initial troponin negative.  Per chart review, patient had a history of EKG changes in 2007 concerning for obstructive CAD, however echocardiogram was normal and left heart cath was not performed.  Echocardiogram in 2021 with no regional wall motion abnormalities.   - Recheck EKG with recurrence of chest pain - Trial of GI cocktail given upper GI bleed - Echocardiogram - Repeat troponin  Iron deficiency anemia Patient has a long-term history of iron deficiency anemia dating back to 2018 for which he follows with heme-onc.  Patient's husband was concern for possible pernicious anemia, however B12 levels have always been within normal limits so less likely.   -Iron panel and ferritin pending  Hashimoto's thyroiditis - Continue home levothyroxine  COPD (chronic obstructive pulmonary disease) (HCC) - Continue home bronchodilators  Narcolepsy due to underlying condition without cataplexy - Holding home modafinil in the setting of  chest pain  Essential hypertension - Restart home amlodipine and valsartan tomorrow  Advance Care Planning:   Code Status: Full Code verified by patient  Consults: Gastroenterology  Family Communication: Patient's husband updated at bedside  Severity of Illness: The appropriate patient status for this patient is OBSERVATION. Observation status is judged to be reasonable and necessary in order to provide the required intensity of service to ensure the patient's safety. The patient's presenting symptoms, physical exam findings, and initial radiographic and laboratory data in the context of their medical condition is felt to place them at decreased risk for further clinical deterioration. Furthermore, it is anticipated that the patient will be medically stable for discharge from the hospital within 2 midnights of admission.   Author: Jose Persia, MD 05/07/2022 7:13 PM  For on call review [email or web address].

## 2022-05-07 NOTE — ED Provider Notes (Signed)
[facility] Provider Note    Event Date/Time   First MD Initiated Contact with Patient 05/07/22 1535     (approximate)  History   Chief Complaint: Chest Pain  HPI  Randy Mclean is a 62 y.o. male with a past medical history of anemia, gastric reflux, hypertension, hyperlipidemia, states an MI 20 years ago with no stent, presents to the emergency department for chest pain.  According to the patient for the past 2 to 3 weeks he has been experiencing pain in the chest he describes more as a tightness sensation.  Patient states recently the pain has been worse when he is ambulating and seems to resolve somewhat at rest.  States mild shortness of breath with exertion as well seems to resolve with rest.  Denies any pleuritic pain no leg pain or swelling.  States very minimal chest tightness currently.  Patient is quite hypertensive 180/74.  States over the last few weeks this is fairly typical for him.  Says he takes hydrochlorothiazide amlodipine and 1 other blood pressure medication.  Physical Exam   Triage Vital Signs: ED Triage Vitals  Enc Vitals Group     BP 05/07/22 1528 (!) 180/74     Pulse Rate 05/07/22 1528 81     Resp 05/07/22 1528 18     Temp 05/07/22 1528 98.4 F (36.9 C)     Temp Source 05/07/22 1528 Oral     SpO2 05/07/22 1528 98 %     Weight 05/07/22 1525 218 lb (98.9 kg)     Height 05/07/22 1525 6' (1.829 m)     Head Circumference --      Peak Flow --      Pain Score 05/07/22 1523 4     Pain Loc --      Pain Edu? --      Excl. in [place]? --     Most recent vital signs: Vitals:   05/07/22 1528  BP: (!) 180/74  Pulse: 81  Resp: 18  Temp: 98.4 F (36.9 C)  SpO2: 98%    General: Awake, no distress.  CV:  Good peripheral perfusion.  Regular rate and rhythm  Resp:  Normal effort.  Equal breath sounds bilaterally.  Abd:  No distention.  Soft, nontender.  No rebound or guarding. Other:  Lower extremity edema or tenderness.   ED  Results / Procedures / Treatments   EKG  EKG viewed and interpreted by myself shows a normal sinus rhythm at 73 bpm with a narrow QRS, normal axis, normal intervals, no concerning ST changes.  RADIOLOGY  I have reviewed and interpreted the chest x-ray images.  No obvious consolidation on my evaluation. Radiology has read the x-ray as negative   [place] ED: Medications - No data to display   IMPRESSION / MDM / [place] / ED COURSE  I reviewed the triage vital signs and the nursing notes.  Patient's presentation is most consistent with acute presentation with potential threat to life or bodily function.  Patient presents emergency department for chest discomfort over the last 2 to 3 weeks worse with exertion better with rest.  Symptoms are concerning for possible ACS or stable angina, differential would also include pneumonia, pneumothorax, other infectious etiology such as COVID, anemia, metabolic or electrolyte abnormality.  Will check labs including cardiac enzymes we will obtain a chest x-ray, COVID swab and continue to closely monitor while awaiting results.  Patient agreeable to plan of care.  Patient's labs have resulted with a hemoglobin of 7.6 which appears to be an acute decrease in the patient's hemoglobin.  The remainder of the workup shows a normal chemistry, negative COVID/flu negative troponin and normal lipase.  I spoke to the patient he states he has noted some dark stool recently.  Rectal examination shows dark appearing stool strongly guaiac positive.  Suspect likely upper GI bleed.  Will start the patient on Protonix bolus and infusion we will start the patient on 1 unit of PRBCs given his past cardiac history in addition to symptomatic anemia and active bleed.  Will admit to the hospital service for further workup and treatment.  Patient states he did have a colonoscopy in 2022 and he believes just a polyp was seen.  CRITICAL CARE Performed by:  Harvest Dark   Total critical care time: 30 minutes  Critical care time was exclusive of separately billable procedures and treating other patients.  Critical care was necessary to treat or prevent imminent or life-threatening deterioration.  Critical care was time spent personally by me on the following activities: development of treatment plan with patient and/or surrogate as well as nursing, discussions with consultants, evaluation of patient's response to treatment, examination of patient, obtaining history from patient or surrogate, ordering and performing treatments and interventions, ordering and review of laboratory studies, ordering and review of radiographic studies, pulse oximetry and re-evaluation of patient's condition.   FINAL CLINICAL IMPRESSION(S) / ED DIAGNOSES   Chest pain GI bleed Symptomatic anemia   Note:  This document was prepared using Dragon voice recognition software and may include unintentional dictation errors.   Harvest Dark, MD 05/07/22 [phone]

## 2022-05-07 NOTE — Assessment & Plan Note (Signed)
Patient endorses a 3-week history of chest pain, shortness of breath and dizziness with exertion only, however during examination, patient was having some short bursts of chest pain while resting.  EKG with no concerning findings and initial troponin negative.  Per chart review, patient had a history of EKG changes in 2007 concerning for obstructive CAD, however echocardiogram was normal and left heart cath was not performed.  Echocardiogram in 2021 with no regional wall motion abnormalities.   - Recheck EKG with recurrence of chest pain - Trial of GI cocktail given upper GI bleed - Echocardiogram - Repeat troponin

## 2022-05-08 ENCOUNTER — Observation Stay
Admit: 2022-05-08 | Discharge: 2022-05-08 | Disposition: A | Discharge status: Home / Self Care | Payer: No Typology Code available for payment source | Attending: Internal Medicine | Admitting: Internal Medicine

## 2022-05-08 DIAGNOSIS — I129 Hypertensive chronic kidney disease with stage 1 through stage 4 chronic kidney disease, or unspecified chronic kidney disease: Secondary | ICD-10-CM | POA: Diagnosis present

## 2022-05-08 DIAGNOSIS — G8929 Other chronic pain: Secondary | ICD-10-CM | POA: Diagnosis present

## 2022-05-08 DIAGNOSIS — Z1152 Encounter for screening for COVID-19: Secondary | ICD-10-CM | POA: Diagnosis not present

## 2022-05-08 DIAGNOSIS — G40909 Epilepsy, unspecified, not intractable, without status epilepticus: Secondary | ICD-10-CM | POA: Diagnosis present

## 2022-05-08 DIAGNOSIS — K3182 Dieulafoy lesion (hemorrhagic) of stomach and duodenum: Secondary | ICD-10-CM | POA: Diagnosis present

## 2022-05-08 DIAGNOSIS — D649 Anemia, unspecified: Secondary | ICD-10-CM | POA: Diagnosis present

## 2022-05-08 DIAGNOSIS — N400 Enlarged prostate without lower urinary tract symptoms: Secondary | ICD-10-CM | POA: Diagnosis present

## 2022-05-08 DIAGNOSIS — J4489 Other specified chronic obstructive pulmonary disease: Secondary | ICD-10-CM | POA: Diagnosis present

## 2022-05-08 DIAGNOSIS — K589 Irritable bowel syndrome without diarrhea: Secondary | ICD-10-CM | POA: Diagnosis present

## 2022-05-08 DIAGNOSIS — K641 Second degree hemorrhoids: Secondary | ICD-10-CM | POA: Diagnosis present

## 2022-05-08 DIAGNOSIS — Z79890 Hormone replacement therapy (postmenopausal): Secondary | ICD-10-CM | POA: Diagnosis not present

## 2022-05-08 DIAGNOSIS — K2270 Barrett's esophagus without dysplasia: Secondary | ICD-10-CM | POA: Diagnosis present

## 2022-05-08 DIAGNOSIS — E063 Autoimmune thyroiditis: Secondary | ICD-10-CM | POA: Diagnosis present

## 2022-05-08 DIAGNOSIS — I10 Essential (primary) hypertension: Secondary | ICD-10-CM | POA: Diagnosis not present

## 2022-05-08 DIAGNOSIS — N183 Chronic kidney disease, stage 3 unspecified: Secondary | ICD-10-CM | POA: Diagnosis present

## 2022-05-08 DIAGNOSIS — K921 Melena: Secondary | ICD-10-CM | POA: Diagnosis present

## 2022-05-08 DIAGNOSIS — F32A Depression, unspecified: Secondary | ICD-10-CM | POA: Diagnosis present

## 2022-05-08 DIAGNOSIS — I2089 Other forms of angina pectoris: Secondary | ICD-10-CM | POA: Diagnosis not present

## 2022-05-08 DIAGNOSIS — K5730 Diverticulosis of large intestine without perforation or abscess without bleeding: Secondary | ICD-10-CM | POA: Diagnosis present

## 2022-05-08 DIAGNOSIS — D509 Iron deficiency anemia, unspecified: Secondary | ICD-10-CM | POA: Diagnosis present

## 2022-05-08 DIAGNOSIS — G47419 Narcolepsy without cataplexy: Secondary | ICD-10-CM | POA: Diagnosis present

## 2022-05-08 DIAGNOSIS — Z23 Encounter for immunization: Secondary | ICD-10-CM | POA: Diagnosis not present

## 2022-05-08 DIAGNOSIS — E785 Hyperlipidemia, unspecified: Secondary | ICD-10-CM | POA: Diagnosis present

## 2022-05-08 DIAGNOSIS — Z87891 Personal history of nicotine dependence: Secondary | ICD-10-CM | POA: Diagnosis not present

## 2022-05-08 DIAGNOSIS — K219 Gastro-esophageal reflux disease without esophagitis: Secondary | ICD-10-CM | POA: Diagnosis present

## 2022-05-08 DIAGNOSIS — K2289 Other specified disease of esophagus: Secondary | ICD-10-CM | POA: Diagnosis present

## 2022-05-08 DIAGNOSIS — K922 Gastrointestinal hemorrhage, unspecified: Secondary | ICD-10-CM | POA: Diagnosis not present

## 2022-05-08 DIAGNOSIS — I25118 Atherosclerotic heart disease of native coronary artery with other forms of angina pectoris: Secondary | ICD-10-CM | POA: Diagnosis present

## 2022-05-08 LAB — CBC
HCT: 27.6 % — ABNORMAL LOW (ref 39.0–52.0)
Hemoglobin: 8 g/dL — ABNORMAL LOW (ref 13.0–17.0)
MCH: 21.7 pg — ABNORMAL LOW (ref 26.0–34.0)
MCHC: 29 g/dL — ABNORMAL LOW (ref 30.0–36.0)
MCV: 75 fL — ABNORMAL LOW (ref 80.0–100.0)
Platelets: 271 10*3/uL (ref 150–400)
RBC: 3.68 MIL/uL — ABNORMAL LOW (ref 4.22–5.81)
RDW: 20.7 % — ABNORMAL HIGH (ref 11.5–15.5)
WBC: 5.8 10*3/uL (ref 4.0–10.5)
nRBC: 0 % (ref 0.0–0.2)

## 2022-05-08 LAB — CBC WITH DIFFERENTIAL/PLATELET
Abs Immature Granulocytes: 0.01 10*3/uL (ref 0.00–0.07)
Basophils Absolute: 0.1 10*3/uL (ref 0.0–0.1)
Basophils Relative: 1 %
Eosinophils Absolute: 0.1 10*3/uL (ref 0.0–0.5)
Eosinophils Relative: 2 %
HCT: 27.3 % — ABNORMAL LOW (ref 39.0–52.0)
Hemoglobin: 7.9 g/dL — ABNORMAL LOW (ref 13.0–17.0)
Immature Granulocytes: 0 %
Lymphocytes Relative: 23 %
Lymphs Abs: 1.1 10*3/uL (ref 0.7–4.0)
MCH: 21.6 pg — ABNORMAL LOW (ref 26.0–34.0)
MCHC: 28.9 g/dL — ABNORMAL LOW (ref 30.0–36.0)
MCV: 74.6 fL — ABNORMAL LOW (ref 80.0–100.0)
Monocytes Absolute: 0.6 10*3/uL (ref 0.1–1.0)
Monocytes Relative: 12 %
Neutro Abs: 3 10*3/uL (ref 1.7–7.7)
Neutrophils Relative %: 62 %
Platelets: 271 10*3/uL (ref 150–400)
RBC: 3.66 MIL/uL — ABNORMAL LOW (ref 4.22–5.81)
RDW: 20.7 % — ABNORMAL HIGH (ref 11.5–15.5)
WBC: 4.8 10*3/uL (ref 4.0–10.5)
nRBC: 0 % (ref 0.0–0.2)

## 2022-05-08 LAB — TYPE AND SCREEN
ABO/RH(D): O POS
Antibody Screen: NEGATIVE
Unit division: 0

## 2022-05-08 LAB — ECHOCARDIOGRAM COMPLETE
AR max vel: 2.81 cm2
AV Area VTI: 2.84 cm2
AV Area mean vel: 2.72 cm2
AV Mean grad: 8.5 mmHg
AV Peak grad: 16.4 mmHg
Ao pk vel: 2.03 m/s
Area-P 1/2: 3.91 cm2
Calc EF: 76.5 %
Height: 72 in
S' Lateral: 3.1 cm
Single Plane A2C EF: 79.7 %
Single Plane A4C EF: 72.7 %
Weight: 3488 oz

## 2022-05-08 LAB — BPAM RBC
Blood Product Expiration Date: 202404182359
ISSUE DATE / TIME: 202403152131
Unit Type and Rh: 5100

## 2022-05-08 LAB — BASIC METABOLIC PANEL
Anion gap: 5 (ref 5–15)
BUN: 9 mg/dL (ref 6–20)
CO2: 29 mmol/L (ref 22–32)
Calcium: 8.3 mg/dL — ABNORMAL LOW (ref 8.9–10.3)
Chloride: 105 mmol/L (ref 98–111)
Creatinine, Ser: 1.18 mg/dL (ref 0.61–1.24)
GFR, Estimated: >60 mL/min (ref >60)
Glucose, Bld: 84 mg/dL (ref 70–99)
Potassium: 3.1 mmol/L — ABNORMAL LOW (ref 3.5–5.1)
Sodium: 139 mmol/L (ref 135–145)

## 2022-05-08 LAB — GLUCOSE, CAPILLARY: Glucose-Capillary: 167 mg/dL — ABNORMAL HIGH (ref 70–99)

## 2022-05-08 LAB — HIV ANTIBODY (ROUTINE TESTING W REFLEX): HIV Screen 4th Generation wRfx: NONREACTIVE

## 2022-05-08 MED ORDER — CITALOPRAM HYDROBROMIDE 20 MG PO TABS
20.0000 mg | ORAL_TABLET | Freq: Every day | ORAL | Status: DC
  Administered 2022-05-08: 20 mg via ORAL
  Filled 2022-05-08 (×2): qty 1

## 2022-05-08 MED ORDER — POTASSIUM CHLORIDE 10 MEQ/100ML IV SOLN
10.0000 meq | INTRAVENOUS | Status: AC
  Administered 2022-05-08 (×5): 10 meq via INTRAVENOUS
  Filled 2022-05-08 (×2): qty 100

## 2022-05-08 MED ORDER — SODIUM CHLORIDE 0.9 % IV SOLN: INTRAVENOUS | Status: DC

## 2022-05-08 MED ORDER — KETOROLAC TROMETHAMINE 15 MG/ML IJ SOLN
15.0000 mg | Freq: Once | INTRAMUSCULAR | Status: AC
  Administered 2022-05-08: 15 mg via INTRAVENOUS
  Filled 2022-05-08: qty 1

## 2022-05-08 MED ORDER — BUPROPION HCL ER (XL) 300 MG PO TB24
300.0000 mg | ORAL_TABLET | Freq: Every day | ORAL | Status: DC
  Administered 2022-05-08 – 2022-05-10 (×3): 300 mg via ORAL
  Filled 2022-05-08 (×4): qty 1

## 2022-05-08 MED ORDER — AMLODIPINE BESYLATE 10 MG PO TABS
10.0000 mg | ORAL_TABLET | Freq: Every day | ORAL | Status: DC
  Administered 2022-05-08 – 2022-05-10 (×2): 10 mg via ORAL
  Filled 2022-05-08 (×2): qty 1

## 2022-05-08 MED ORDER — MORPHINE SULFATE (PF) 2 MG/ML IV SOLN
2.0000 mg | Freq: Once | INTRAVENOUS | Status: AC
  Administered 2022-05-08: 2 mg via INTRAVENOUS
  Filled 2022-05-08: qty 1

## 2022-05-08 MED ORDER — BUTALBITAL-APAP-CAFFEINE 50-325-40 MG PO TABS
2.0000 | ORAL_TABLET | Freq: Once | ORAL | Status: AC
  Administered 2022-05-08: 2 via ORAL
  Filled 2022-05-08: qty 2

## 2022-05-08 MED ORDER — POTASSIUM CHLORIDE 10 MEQ/100ML IV SOLN
10.0000 meq | INTRAVENOUS | Status: DC
  Filled 2022-05-08: qty 100

## 2022-05-08 MED ORDER — POLYETHYLENE GLYCOL 3350 17 GM/SCOOP PO POWD
1.0000 | Freq: Once | ORAL | Status: AC
  Administered 2022-05-08: 255 g via ORAL
  Filled 2022-05-08: qty 255

## 2022-05-08 NOTE — H&P (View-Only) (Signed)
GI Inpatient Consult Note  Reason for Consult: GI bleed, symptomatic anemia    Attending Requesting Consult: Dr. Jose Persia, MD  History of Present Illness: Randy Mclean is a 63 y.o. male seen for evaluation of GI bleed and symptomatic anemia at the request of admitting hospitalist - Dr. Jose Persia. Patient has a PMH of HTN, HLD, IDA on IV iron supplementation, COPD, chronic low back pain on chronic narcotics, hx of myocardial infarction, migraines, hypothyroidism, and hx of seizure disorder. He presented to the [facility] ED yesterday afternoon for chief complaint of chest pain with associated SOBOE, dizziness, indigestion, and rectal bleeding x 3 weeks. Upon presentation to the ED, he was hypertensive (180/74) and otherwise normal vital signs. Labs significant for hemoglobin 7.6, MCV 75, ferritin 7, iron sat 4%, serum iron 20, TIBC 466 potassium 3.2, serum creatinine 1.16, GFR >60, troponin 7, and acute respiratory panel negative. CXR with no acute cardiopulmonary disease. Per ED physician, patient had dark stool on DRE which was guaiac positive. He was started on Protonix bolus and gtt and transfused 1 unit pRBCs. GI consulted for further evaluation and management.   Patient seen and examined this morning resting comfortably in hospital bed. No acute events overnight. He has had no further episodes of overt hematochezia or melena overnight. No bleeding since admission. His hemoglobin improved to 7.9 after 1 unit pRBCs. His hemoglobin in January 2024 prior to IV iron infusion was 9.2 and one year ago hemoglobin was 11. He reports over the past three weeks he has been seeing bright red, dark red, and black stools almost every time he has a bowel movement. The bleeding presents like explosive diarrhea and turns the whole commode red. He typically has a BM every other day at baseline. He denies any abdominal pain. He was seen and evaluated by me two years ago where he is s/p bidirectional  endoscopies with Dr. Alice Reichert 02/2020 which were negative for sources of anemia. He was scheduled for VCE to assess for small bowel bleeding, but did not have this performed. He reports around this time his sister was diagnosed with metastatic lung cancer and decided not to have it done. He has continued to follow closely in Hematology with Dr. Grayland Ormond for IV iron infusions. He received 5 IV iron infusions Jan-Feb this year.     Summary of GI Procedures:  - EGD: 10/10/2009 - normal esophagus with esophageal bx negative for Barrett's, normal stomach, and normal duodenum  - CSY: 10/10/2009 - two small polyps in sigmoid and transverse colon with path showing hyperplastic polyp and tubular adenoma - CSY: 09/24/2013 - diminutive TA at cecum. Diffuse mildly melanotic mucosa in sigmoid, descending, transverse, and ascending colon. Small internal hemorrhoids - EGD: 09/24/2013 -  squamocolumnar mucosa with reflux gastroesophagitis (no IM or dysplasia), stomach - moderate chronic gastritis with moderate intestinal metaplasia, duodenum - unremarkable - EGD: 03/21/2020 - mild reflux gastroesophagitis with bx negative for BE, normal stomach, and normal duodenum  - CSY: 03/21/2020 - normal examined colon, Grade II non-bleeding IH    Past Medical History:  Past Medical History:  Diagnosis Date   Acute appendicitis 12/04/2017   Allergy    takes allergy shots   Anemia    Arthritis    Asthma    Barrett esophagus    Bronchitis    Chronic pain    Colon polyp    Depression    GERD (gastroesophageal reflux disease)    Hemorrhoid    Hyperlipidemia  Hypertension    Hypothyroid    IBS (irritable bowel syndrome)    Kidney stone    MI (myocardial infarction) ([place])    between 2002 and 2006   Migraine    Pneumonia    Seizures ([place])    Sinus problem    Spinal cord stimulator status     Problem List: Patient Active Problem List   Diagnosis Date Noted   Symptomatic anemia 05/07/2022   Upper GI bleed 05/07/2022    Laceration of left index finger without foreign body without damage to nail 02/26/2021   Prediabetes 11/21/2020   Stage 3 chronic kidney disease ([place]) 11/21/2020   Battery end of life of spinal cord stimulator 09/18/2020   Bilateral carotid artery stenosis 05/18/2019   Stable angina 05/18/2019   Cervical disc disorder at C5-C6 level with myelopathy 08/30/2018   Preoperative clearance 08/30/2018   Generalized anxiety disorder with panic attacks 08/30/2018   History of long-term treatment with high-risk medication 08/30/2018   Narcolepsy due to underlying condition without cataplexy 05/23/2018   Fever 04/05/2018   Acute bronchitis with asthma 04/05/2018   Cough 04/05/2018   Asthma, chronic 04/05/2018   Cutaneous candidiasis 04/05/2018   Nonintractable headache 04/05/2018   Inflammatory polyarthritis ([place]) 07/27/2017   Other fatigue 07/27/2017   Vitamin B12 deficiency 07/27/2017   Impaired fasting glucose 07/27/2017   Attention and concentration deficit 07/27/2017   Left arm numbness 09/09/2016   Rotator cuff tendonitis, right 10/02/2015   Iron deficiency anemia 09/17/2014   Angiolipoma of skin 02/22/2014   Mechanical complication of nervous system device, implant, and graft 02/09/2013   Neuroma of leg 12/27/2012   Leg mass 11/23/2012   Chest pain 05/01/2012   Incomplete emptying of bladder 01/28/2012   Increased frequency of urination 01/28/2012   Urinary hesitancy 01/28/2012   Testicular hypofunction 12/22/2011   Peyronie's disease 12/22/2011   Reduced libido 12/22/2011   Low back pain 09/08/2011   COPD (chronic obstructive pulmonary disease) ([place]) 09/07/2011   Dyspnea 09/07/2011   Hashimoto's thyroiditis 08/31/2011   Essential hypertension 10/16/2008   BACK PAIN WITH RADICULOPATHY 06/20/2007   FATIGUE 10/31/2006   BENIGN PROSTATIC HYPERTROPHY 09/22/2005   PROSTATITIS, CHRONIC 09/22/2005   HYPERGLYCEMIA 09/22/2005   Hyperlipidemia 04/22/2004   GERD 04/22/2004    CARPAL TUNNEL SYNDROME, BILATERAL 11/23/2003   LATERAL EPICONDYLITIS, BILATERAL 11/23/2003   Anxiety state 09/23/1999   DEPRESSION 02/23/1999   Hypothyroidism 02/23/1991   DISORDER, TOBACCO USE 02/22/1978    Past Surgical History: Past Surgical History:  Procedure Laterality Date   ANTERIOR CERVICAL DISCECTOMY     APPENDECTOMY     BACK SURGERY     BACK SURGERY  12/04/2020   COLONOSCOPY  09/2013   elbow surgery     HEMORRHOID SURGERY     LAPAROSCOPIC APPENDECTOMY N/A 12/04/2017   Procedure: APPENDECTOMY LAPAROSCOPIC;  Surgeon: Vickie Epley, MD;  Location: [facility] ORS;  Service: General;  Laterality: N/A;   LIPOMA EXCISION Right    leg   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     SPINAL CORD STIMULATOR IMPLANT     UPPER GI ENDOSCOPY  09/2013   WRIST SURGERY Left 01/14/2021    Allergies: Allergies  Allergen Reactions   Amoxicillin Anaphylaxis    REACTION: unspecified   Penicillin G Anaphylaxis   Gabapentin Other (See Comments)    REACTION: anaphylaxis   Penicillins     REACTION: unspecified    Home Medications: Medications Prior to Admission  Medication Sig Dispense Refill Last Dose  albuterol (VENTOLIN HFA) 108 (90 Base) MCG/ACT inhaler Inhale 2 puffs into the lungs every 4 (four) hours as needed for wheezing or shortness of breath. 8.5 g 3 unknown   alfuzosin (UROXATRAL) 10 MG 24 hr tablet TAKE 1 TABLET(10 MG) BY MOUTH DAILY WITH BREAKFAST (Patient taking differently: Take 10 mg by mouth at bedtime.) 90 tablet 1 05/06/2022 at 2300   ALPRAZolam (XANAX) 0.5 MG tablet Take 1 tablet (0.5 mg total) by mouth 3 (three) times daily as needed for anxiety. 90 tablet 2 05/06/2022 at 2300   amLODipine (NORVASC) 5 MG tablet Take 2 tablets (10 mg total) by mouth daily. 90 tablet 1 05/06/2022 at 1000   atorvastatin (LIPITOR) 10 MG tablet TAKE 1 TABLET BY MOUTH DAILY (Patient taking differently: Take 10 mg by mouth daily.) 90 tablet 1 05/06/2022 at 2300   buPROPion (WELLBUTRIN XL) 300  MG 24 hr tablet TAKE 1 TABLET BY MOUTH DAILY, GENERIC EQUIVALENT FOR WELLBUTRIN XL. DUE FOR OFFICE VISIT. (Patient taking differently: Take 300 mg by mouth every morning. TAKE 1 TABLET BY MOUTH DAILY, GENERIC EQUIVALENT FOR WELLBUTRIN XL. DUE FOR OFFICE VISIT.) 90 tablet 1 05/06/2022 at 1000   citalopram (CELEXA) 20 MG tablet Take 1 tablet (20 mg total) by mouth daily. 90 tablet 1 05/06/2022 at 2300   esomeprazole (NEXIUM) 40 MG capsule Take 40 mg by mouth 2 (two) times daily before a meal.  0 05/06/2022   hydrochlorothiazide (HYDRODIURIL) 12.5 MG tablet Take 1 tablet (12.5 mg total) by mouth daily. 90 tablet 1 05/06/2022 at 1000   levothyroxine (SYNTHROID) 150 MCG tablet TAKE 1 TABLET BY MOUTH DAILY BEFORE BREAKFAST. 90 tablet 1 05/06/2022 at 0900   methocarbamol (ROBAXIN) 750 MG tablet Take 750 mg by mouth 4 (four) times daily as needed.   05/06/2022 at 2300   modafinil (PROVIGIL) 200 MG tablet Take 1 tablet (200 mg total) by mouth daily. 30 tablet 2 05/06/2022 at 1000   mometasone-formoterol (DULERA) 100-5 MCG/ACT AERO INHALE 2 PUFFS BY MOUTH INTO THE LUNGS EVERY 12 HOURS 39 g 3 unknown   Multiple Vitamin (MULTI-VITAMINS) TABS Take 1 tablet by mouth daily.   05/06/2022 at 1000   pregabalin (LYRICA) 100 MG capsule Take 100 mg by mouth every 8 (eight) hours.   05/06/2022 at 2300   tadalafil (CIALIS) 20 MG tablet Take 0.5-1 tablets (10-20 mg total) by mouth daily as needed for erectile dysfunction. 10 tablet 5 unknown   tiZANidine (ZANAFLEX) 4 MG tablet Take 4 mg by mouth every 6 (six) hours as needed for muscle spasms. Pt takes one tablet at night   05/06/2022 at 2300   chlorpheniramine-HYDROcodone (TUSSIONEX) 10-8 MG/5ML Take 5 mLs by mouth every 12 (twelve) hours as needed. (Patient not taking: Reported on 05/07/2022) 140 mL 0 Not Taking   magnesium gluconate (MAGONATE) 500 MG tablet Take 500 mg by mouth daily. (Patient not taking: Reported on 05/07/2022)   Not Taking   predniSONE (DELTASONE) 10 MG tablet Take  1 tab po 3 x day for 3 days then take 1 tab po 2 x a day for 3 days and then take 1 tab po daily for 3 days (Patient not taking: Reported on 05/07/2022) 18 tablet 0 Not Taking   SYRINGE-NEEDLE, DISP, 3 ML 22G X 1" 3 ML MISC Use as directed.      testosterone cypionate (DEPOTESTOSTERONE CYPIONATE) 200 MG/ML injection INJECT 0.75ML ONCE A WEEK AS DIRECTED 10 mL 1 05/03/2022   valsartan (DIOVAN) 160 MG tablet Take 1 tablet (  160 mg total) by mouth daily. (Patient not taking: Reported on 05/07/2022) 90 tablet 1 Not Taking   Home medication reconciliation was completed with the patient.   Scheduled Inpatient Medications:    alfuzosin  10 mg Oral Q breakfast   amLODipine  10 mg Oral Daily   atorvastatin  10 mg Oral Daily   buPROPion  300 mg Oral Daily   citalopram  20 mg Oral Daily   hydrochlorothiazide  12.5 mg Oral Daily   levothyroxine  150 mcg Oral Q0600   mometasone-formoterol  2 puff Inhalation BID   pneumococcal 23 valent vaccine  0.5 mL Intramuscular Tomorrow-1000   pregabalin  100 mg Oral Q8H   sodium chloride flush  3 mL Intravenous Q12H    Continuous Inpatient Infusions:    sodium chloride     pantoprazole 8 mg/hr (05/08/22 0258)   potassium chloride      PRN Inpatient Medications:  acetaminophen **OR** acetaminophen, albuterol, ALPRAZolam, methocarbamol, ondansetron **OR** ondansetron (ZOFRAN) IV, tiZANidine  Family History: family history includes Colon cancer in his sister; Diabetes in his father; Heart attack in his father; Hypertension in his mother; Liver cancer in his sister; Lung cancer in his sister; Lung disease in his mother; Uterine cancer in his sister.  The patient's family history is negative for inflammatory bowel disorders, GI malignancy, or solid organ transplantation.  Social History:   reports that he quit smoking about 17 years ago. His smoking use included cigarettes. He has a 30.00 pack-year smoking history. He has never used smokeless tobacco. He reports  current alcohol use. He reports that he does not use drugs. The patient denies ETOH, tobacco, or drug use.   Review of Systems: Constitutional: Weight is stable.  Eyes: No changes in vision. ENT: No oral lesions, sore throat.  GI: see HPI.  Heme/Lymph: No easy bruising.  CV: No chest pain.  GU: No hematuria.  Integumentary: No rashes.  Neuro: No headaches.  Psych: No depression/anxiety.  Endocrine: No heat/cold intolerance.  Allergic/Immunologic: No urticaria.  Resp: No cough, SOB.  Musculoskeletal: No joint swelling.    Physical Examination: BP 116/63 (BP Location: Left Arm)   Pulse 66   Temp 97.9 F (36.6 C)   Resp 16   Ht 6' (1.829 m)   Wt 98.9 kg   SpO2 96%   BMI 29.57 kg/m  Gen: NAD, alert and oriented x 4 HEENT: PEERLA, EOMI, Neck: supple, no JVD or thyromegaly Chest: CTA bilaterally, no wheezes, crackles, or other adventitious sounds CV: RRR, no m/g/c/r Abd: soft, NT, ND, +BS in all four quadrants; no HSM, guarding, ridigity, or rebound tenderness Ext: no edema, well perfused with 2+ pulses, Skin: no rash or lesions noted Lymph: no LAD  Data: Lab Results  Component Value Date   WBC 4.8 05/08/2022   HGB 7.9 (L) 05/08/2022   HCT 27.3 (L) 05/08/2022   MCV 74.6 (L) 05/08/2022   PLT 271 05/08/2022   Recent Labs  Lab 05/07/22 1540 05/08/22 0550  HGB 7.6* 7.9*   Lab Results  Component Value Date   NA 139 05/08/2022   K 3.1 (L) 05/08/2022   CL 105 05/08/2022   CO2 29 05/08/2022   BUN 9 05/08/2022   CREATININE 1.18 05/08/2022   Lab Results  Component Value Date   ALT 17 05/07/2022   AST 21 05/07/2022   ALKPHOS 63 05/07/2022   BILITOT 0.5 05/07/2022   No results for input(s): "APTT", "INR", "PTT" in the last 168 hours.  Assessment/Plan:  62 y/o Caucasian male with a PMH of HTN, HLD, IDA on IV iron supplementation, hx of myocardial infarction, migraines, hypothyroidism, COPD, chronic back pain, and hx of seizure disorder presented to the [facility] ED  yesterday afternoon for chief complaint of 1-week hx of substernal chest pain, shortness of breath on exertion, indigestion, and rectal bleeding. He was admitted for symptomatic anemia with rectal bleeding.   Symptomatic anemia/Iron-deficiency anemia - suspect 2/2 chronic GI blood loss. Previous bidirectional endoscopies 02/2020 negative for sources of anemia. He was scheduled for VCE 03/2020, but cancelled procedure.   GI bleed - unclear if UGI or LGI source, no overt gastrointestinal bleeding, hemoglobin 7.9 after 1 unit pRBCs. DDx includes PUD, gastritis, AVMs, neoplasm, polyp, GAVE, etc  Chest pain - stable angina, suspect likely related to symptomatic anemia  COPD  Recommendations:  - Maintain 2 large bore IVs for access - Continue to monitor serial H&H. Transfuse for Hgb <7.0.  - No signs of overt gastrointestinal bleeding - Continue Protonix gtt for gastric protection - Continue supportive care per primary team - Given his report of rectal bleeding x 3 weeks with 2-3 gram drop in hemoglobin and hx of unexplained IDA, I recommend repeat bidirectional endoscopies + VCE  - EGD and colonoscopy with Dr. Virgina Jock tomorrow - VCE if above is unrevealing - Clear liquid diet today. NPO midnight. Bowel prep orders placed.  - If there are signs of overt GIB, please call Dr. Virgina Jock - Further recommendations after procedures  I reviewed the risks (including bleeding, perforation, infection, anesthesia complications, cardiac/respiratory complications), benefits and alternatives of EGD and colonoscopy. Patient consents to proceed.   Thank you for the consult. Please call with questions or concerns.  Geanie Kenning, PA-C [facility] [phone]

## 2022-05-08 NOTE — Consult Note (Addendum)
GI Inpatient Consult Note  Reason for Consult: GI bleed, symptomatic anemia    Attending Requesting Consult: Dr. Jose Persia, MD  History of Present Illness: Randy Mclean is a 62 y.o. male seen for evaluation of GI bleed and symptomatic anemia at the request of admitting hospitalist - Dr. Jose Persia. Patient has a PMH of HTN, HLD, IDA on IV iron supplementation, COPD, chronic low back pain on chronic narcotics, hx of myocardial infarction, migraines, hypothyroidism, and hx of seizure disorder. He presented to the [facility] ED yesterday afternoon for chief complaint of chest pain with associated SOBOE, dizziness, indigestion, and rectal bleeding x 3 weeks. Upon presentation to the ED, he was hypertensive (180/74) and otherwise normal vital signs. Labs significant for hemoglobin 7.6, MCV 75, ferritin 7, iron sat 4%, serum iron 20, TIBC 466 potassium 3.2, serum creatinine 1.16, GFR >60, troponin 7, and acute respiratory panel negative. CXR with no acute cardiopulmonary disease. Per ED physician, patient had dark stool on DRE which was guaiac positive. He was started on Protonix bolus and gtt and transfused 1 unit pRBCs. GI consulted for further evaluation and management.   Patient seen and examined this morning resting comfortably in hospital bed. No acute events overnight. He has had no further episodes of overt hematochezia or melena overnight. No bleeding since admission. His hemoglobin improved to 7.9 after 1 unit pRBCs. His hemoglobin in January 2024 prior to IV iron infusion was 9.2 and one year ago hemoglobin was 11. He reports over the past three weeks he has been seeing bright red, dark red, and black stools almost every time he has a bowel movement. The bleeding presents like explosive diarrhea and turns the whole commode red. He typically has a BM every other day at baseline. He denies any abdominal pain. He was seen and evaluated by me two years ago where he is s/p bidirectional  endoscopies with Dr. Alice Reichert 02/2020 which were negative for sources of anemia. He was scheduled for VCE to assess for small bowel bleeding, but did not have this performed. He reports around this time his sister was diagnosed with metastatic lung cancer and decided not to have it done. He has continued to follow closely in Hematology with Dr. Grayland Ormond for IV iron infusions. He received 5 IV iron infusions Jan-Feb this year.     Summary of GI Procedures:  - EGD: 10/10/2009 - normal esophagus with esophageal bx negative for Barrett's, normal stomach, and normal duodenum  - CSY: 10/10/2009 - two small polyps in sigmoid and transverse colon with path showing hyperplastic polyp and tubular adenoma - CSY: 09/24/2013 - diminutive TA at cecum. Diffuse mildly melanotic mucosa in sigmoid, descending, transverse, and ascending colon. Small internal hemorrhoids - EGD: 09/24/2013 -  squamocolumnar mucosa with reflux gastroesophagitis (no IM or dysplasia), stomach - moderate chronic gastritis with moderate intestinal metaplasia, duodenum - unremarkable - EGD: 03/21/2020 - mild reflux gastroesophagitis with bx negative for BE, normal stomach, and normal duodenum  - CSY: 03/21/2020 - normal examined colon, Grade II non-bleeding IH    Past Medical History:  Past Medical History:  Diagnosis Date   Acute appendicitis 12/04/2017   Allergy    takes allergy shots   Anemia    Arthritis    Asthma    Barrett esophagus    Bronchitis    Chronic pain    Colon polyp    Depression    GERD (gastroesophageal reflux disease)    Hemorrhoid    Hyperlipidemia  Hypertension    Hypothyroid    IBS (irritable bowel syndrome)    Kidney stone    MI (myocardial infarction) ([place])    between 2002 and 2006   Migraine    Pneumonia    Seizures ([place])    Sinus problem    Spinal cord stimulator status     Problem List: Patient Active Problem List   Diagnosis Date Noted   Symptomatic anemia 05/07/2022   Upper GI bleed 05/07/2022    Laceration of left index finger without foreign body without damage to nail 02/26/2021   Prediabetes 11/21/2020   Stage 3 chronic kidney disease ([place]) 11/21/2020   Battery end of life of spinal cord stimulator 09/18/2020   Bilateral carotid artery stenosis 05/18/2019   Stable angina 05/18/2019   Cervical disc disorder at C5-C6 level with myelopathy 08/30/2018   Preoperative clearance 08/30/2018   Generalized anxiety disorder with panic attacks 08/30/2018   History of long-term treatment with high-risk medication 08/30/2018   Narcolepsy due to underlying condition without cataplexy 05/23/2018   Fever 04/05/2018   Acute bronchitis with asthma 04/05/2018   Cough 04/05/2018   Asthma, chronic 04/05/2018   Cutaneous candidiasis 04/05/2018   Nonintractable headache 04/05/2018   Inflammatory polyarthritis ([place]) 07/27/2017   Other fatigue 07/27/2017   Vitamin B12 deficiency 07/27/2017   Impaired fasting glucose 07/27/2017   Attention and concentration deficit 07/27/2017   Left arm numbness 09/09/2016   Rotator cuff tendonitis, right 10/02/2015   Iron deficiency anemia 09/17/2014   Angiolipoma of skin 02/22/2014   Mechanical complication of nervous system device, implant, and graft 02/09/2013   Neuroma of leg 12/27/2012   Leg mass 11/23/2012   Chest pain 05/01/2012   Incomplete emptying of bladder 01/28/2012   Increased frequency of urination 01/28/2012   Urinary hesitancy 01/28/2012   Testicular hypofunction 12/22/2011   Peyronie's disease 12/22/2011   Reduced libido 12/22/2011   Low back pain 09/08/2011   COPD (chronic obstructive pulmonary disease) ([place]) 09/07/2011   Dyspnea 09/07/2011   Hashimoto's thyroiditis 08/31/2011   Essential hypertension 10/16/2008   BACK PAIN WITH RADICULOPATHY 06/20/2007   FATIGUE 10/31/2006   BENIGN PROSTATIC HYPERTROPHY 09/22/2005   PROSTATITIS, CHRONIC 09/22/2005   HYPERGLYCEMIA 09/22/2005   Hyperlipidemia 04/22/2004   GERD 04/22/2004    CARPAL TUNNEL SYNDROME, BILATERAL 11/23/2003   LATERAL EPICONDYLITIS, BILATERAL 11/23/2003   Anxiety state 09/23/1999   DEPRESSION 02/23/1999   Hypothyroidism 02/23/1991   DISORDER, TOBACCO USE 02/22/1978    Past Surgical History: Past Surgical History:  Procedure Laterality Date   ANTERIOR CERVICAL DISCECTOMY     APPENDECTOMY     BACK SURGERY     BACK SURGERY  12/04/2020   COLONOSCOPY  09/2013   elbow surgery     HEMORRHOID SURGERY     LAPAROSCOPIC APPENDECTOMY N/A 12/04/2017   Procedure: APPENDECTOMY LAPAROSCOPIC;  Surgeon: Vickie Epley, MD;  Location: [facility] ORS;  Service: General;  Laterality: N/A;   LIPOMA EXCISION Right    leg   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     SPINAL CORD STIMULATOR IMPLANT     UPPER GI ENDOSCOPY  09/2013   WRIST SURGERY Left 01/14/2021    Allergies: Allergies  Allergen Reactions   Amoxicillin Anaphylaxis    REACTION: unspecified   Penicillin G Anaphylaxis   Gabapentin Other (See Comments)    REACTION: anaphylaxis   Penicillins     REACTION: unspecified    Home Medications: Medications Prior to Admission  Medication Sig Dispense Refill Last Dose  albuterol (VENTOLIN HFA) 108 (90 Base) MCG/ACT inhaler Inhale 2 puffs into the lungs every 4 (four) hours as needed for wheezing or shortness of breath. 8.5 g 3 unknown   alfuzosin (UROXATRAL) 10 MG 24 hr tablet TAKE 1 TABLET(10 MG) BY MOUTH DAILY WITH BREAKFAST (Patient taking differently: Take 10 mg by mouth at bedtime.) 90 tablet 1 05/06/2022 at 2300   ALPRAZolam (XANAX) 0.5 MG tablet Take 1 tablet (0.5 mg total) by mouth 3 (three) times daily as needed for anxiety. 90 tablet 2 05/06/2022 at 2300   amLODipine (NORVASC) 5 MG tablet Take 2 tablets (10 mg total) by mouth daily. 90 tablet 1 05/06/2022 at 1000   atorvastatin (LIPITOR) 10 MG tablet TAKE 1 TABLET BY MOUTH DAILY (Patient taking differently: Take 10 mg by mouth daily.) 90 tablet 1 05/06/2022 at 2300   buPROPion (WELLBUTRIN XL) 300  MG 24 hr tablet TAKE 1 TABLET BY MOUTH DAILY, GENERIC EQUIVALENT FOR WELLBUTRIN XL. DUE FOR OFFICE VISIT. (Patient taking differently: Take 300 mg by mouth every morning. TAKE 1 TABLET BY MOUTH DAILY, GENERIC EQUIVALENT FOR WELLBUTRIN XL. DUE FOR OFFICE VISIT.) 90 tablet 1 05/06/2022 at 1000   citalopram (CELEXA) 20 MG tablet Take 1 tablet (20 mg total) by mouth daily. 90 tablet 1 05/06/2022 at 2300   esomeprazole (NEXIUM) 40 MG capsule Take 40 mg by mouth 2 (two) times daily before a meal.  0 05/06/2022   hydrochlorothiazide (HYDRODIURIL) 12.5 MG tablet Take 1 tablet (12.5 mg total) by mouth daily. 90 tablet 1 05/06/2022 at 1000   levothyroxine (SYNTHROID) 150 MCG tablet TAKE 1 TABLET BY MOUTH DAILY BEFORE BREAKFAST. 90 tablet 1 05/06/2022 at 0900   methocarbamol (ROBAXIN) 750 MG tablet Take 750 mg by mouth 4 (four) times daily as needed.   05/06/2022 at 2300   modafinil (PROVIGIL) 200 MG tablet Take 1 tablet (200 mg total) by mouth daily. 30 tablet 2 05/06/2022 at 1000   mometasone-formoterol (DULERA) 100-5 MCG/ACT AERO INHALE 2 PUFFS BY MOUTH INTO THE LUNGS EVERY 12 HOURS 39 g 3 unknown   Multiple Vitamin (MULTI-VITAMINS) TABS Take 1 tablet by mouth daily.   05/06/2022 at 1000   pregabalin (LYRICA) 100 MG capsule Take 100 mg by mouth every 8 (eight) hours.   05/06/2022 at 2300   tadalafil (CIALIS) 20 MG tablet Take 0.5-1 tablets (10-20 mg total) by mouth daily as needed for erectile dysfunction. 10 tablet 5 unknown   tiZANidine (ZANAFLEX) 4 MG tablet Take 4 mg by mouth every 6 (six) hours as needed for muscle spasms. Pt takes one tablet at night   05/06/2022 at 2300   chlorpheniramine-HYDROcodone (TUSSIONEX) 10-8 MG/5ML Take 5 mLs by mouth every 12 (twelve) hours as needed. (Patient not taking: Reported on 05/07/2022) 140 mL 0 Not Taking   magnesium gluconate (MAGONATE) 500 MG tablet Take 500 mg by mouth daily. (Patient not taking: Reported on 05/07/2022)   Not Taking   predniSONE (DELTASONE) 10 MG tablet Take  1 tab po 3 x day for 3 days then take 1 tab po 2 x a day for 3 days and then take 1 tab po daily for 3 days (Patient not taking: Reported on 05/07/2022) 18 tablet 0 Not Taking   SYRINGE-NEEDLE, DISP, 3 ML 22G X 1" 3 ML MISC Use as directed.      testosterone cypionate (DEPOTESTOSTERONE CYPIONATE) 200 MG/ML injection INJECT 0.75ML ONCE A WEEK AS DIRECTED 10 mL 1 05/03/2022   valsartan (DIOVAN) 160 MG tablet Take 1 tablet (  160 mg total) by mouth daily. (Patient not taking: Reported on 05/07/2022) 90 tablet 1 Not Taking   Home medication reconciliation was completed with the patient.   Scheduled Inpatient Medications:    alfuzosin  10 mg Oral Q breakfast   amLODipine  10 mg Oral Daily   atorvastatin  10 mg Oral Daily   buPROPion  300 mg Oral Daily   citalopram  20 mg Oral Daily   hydrochlorothiazide  12.5 mg Oral Daily   levothyroxine  150 mcg Oral Q0600   mometasone-formoterol  2 puff Inhalation BID   pneumococcal 23 valent vaccine  0.5 mL Intramuscular Tomorrow-1000   pregabalin  100 mg Oral Q8H   sodium chloride flush  3 mL Intravenous Q12H    Continuous Inpatient Infusions:    sodium chloride     pantoprazole 8 mg/hr (05/08/22 0258)   potassium chloride      PRN Inpatient Medications:  acetaminophen **OR** acetaminophen, albuterol, ALPRAZolam, methocarbamol, ondansetron **OR** ondansetron (ZOFRAN) IV, tiZANidine  Family History: family history includes Colon cancer in his sister; Diabetes in his father; Heart attack in his father; Hypertension in his mother; Liver cancer in his sister; Lung cancer in his sister; Lung disease in his mother; Uterine cancer in his sister.  The patient's family history is negative for inflammatory bowel disorders, GI malignancy, or solid organ transplantation.  Social History:   reports that he quit smoking about 17 years ago. His smoking use included cigarettes. He has a 30.00 pack-year smoking history. He has never used smokeless tobacco. He reports  current alcohol use. He reports that he does not use drugs. The patient denies ETOH, tobacco, or drug use.   Review of Systems: Constitutional: Weight is stable.  Eyes: No changes in vision. ENT: No oral lesions, sore throat.  GI: see HPI.  Heme/Lymph: No easy bruising.  CV: No chest pain.  GU: No hematuria.  Integumentary: No rashes.  Neuro: No headaches.  Psych: No depression/anxiety.  Endocrine: No heat/cold intolerance.  Allergic/Immunologic: No urticaria.  Resp: No cough, SOB.  Musculoskeletal: No joint swelling.    Physical Examination: BP 116/63 (BP Location: Left Arm)   Pulse 66   Temp 97.9 F (36.6 C)   Resp 16   Ht 6' (1.829 m)   Wt 98.9 kg   SpO2 96%   BMI 29.57 kg/m  Gen: NAD, alert and oriented x 4 HEENT: PEERLA, EOMI, Neck: supple, no JVD or thyromegaly Chest: CTA bilaterally, no wheezes, crackles, or other adventitious sounds CV: RRR, no m/g/c/r Abd: soft, NT, ND, +BS in all four quadrants; no HSM, guarding, ridigity, or rebound tenderness Ext: no edema, well perfused with 2+ pulses, Skin: no rash or lesions noted Lymph: no LAD  Data: Lab Results  Component Value Date   WBC 4.8 05/08/2022   HGB 7.9 (L) 05/08/2022   HCT 27.3 (L) 05/08/2022   MCV 74.6 (L) 05/08/2022   PLT 271 05/08/2022   Recent Labs  Lab 05/07/22 1540 05/08/22 0550  HGB 7.6* 7.9*   Lab Results  Component Value Date   NA 139 05/08/2022   K 3.1 (L) 05/08/2022   CL 105 05/08/2022   CO2 29 05/08/2022   BUN 9 05/08/2022   CREATININE 1.18 05/08/2022   Lab Results  Component Value Date   ALT 17 05/07/2022   AST 21 05/07/2022   ALKPHOS 63 05/07/2022   BILITOT 0.5 05/07/2022   No results for input(s): "APTT", "INR", "PTT" in the last 168 hours.  Assessment/Plan:  62 y/o Caucasian male with a PMH of HTN, HLD, IDA on IV iron supplementation, hx of myocardial infarction, migraines, hypothyroidism, COPD, chronic back pain, and hx of seizure disorder presented to the [facility] ED  yesterday afternoon for chief complaint of 1-week hx of substernal chest pain, shortness of breath on exertion, indigestion, and rectal bleeding. He was admitted for symptomatic anemia with rectal bleeding.   Symptomatic anemia/Iron-deficiency anemia - suspect 2/2 chronic GI blood loss. Previous bidirectional endoscopies 02/2020 negative for sources of anemia. He was scheduled for VCE 03/2020, but cancelled procedure.   GI bleed - unclear if UGI or LGI source, no overt gastrointestinal bleeding, hemoglobin 7.9 after 1 unit pRBCs. DDx includes PUD, gastritis, AVMs, neoplasm, polyp, GAVE, etc  Chest pain - stable angina, suspect likely related to symptomatic anemia  COPD  Recommendations:  - Maintain 2 large bore IVs for access - Continue to monitor serial H&H. Transfuse for Hgb <7.0.  - No signs of overt gastrointestinal bleeding - Continue Protonix gtt for gastric protection - Continue supportive care per primary team - Given his report of rectal bleeding x 3 weeks with 2-3 gram drop in hemoglobin and hx of unexplained IDA, I recommend repeat bidirectional endoscopies + VCE  - EGD and colonoscopy with Dr. Virgina Jock tomorrow - VCE if above is unrevealing - Clear liquid diet today. NPO midnight. Bowel prep orders placed.  - If there are signs of overt GIB, please call Dr. Virgina Jock - Further recommendations after procedures  I reviewed the risks (including bleeding, perforation, infection, anesthesia complications, cardiac/respiratory complications), benefits and alternatives of EGD and colonoscopy. Patient consents to proceed.   Thank you for the consult. Please call with questions or concerns.  Geanie Kenning, PA-C [facility] [phone]

## 2022-05-08 NOTE — Progress Notes (Signed)
Progress Note   Patient: Randy Mclean Younger [id number] DOB: 1960-10-14 DOA: 05/07/2022     0 DOS: the patient was seen and examined on 05/08/2022   Brief hospital course:  Randy Mclean Younger is a 62 y.o. male with medical history significant of hypertension, hyperlipidemia, iron deficiency anemia on IV iron, MI (age of 21), hypothyroidism, seizures, who presents to the ED due to chest pain.   [name] states that for the last 3 weeks, he has been experiencing persistent substernal chest pain that occurs when he is ambulating or exerting self. Symptoms improve with rest. The chest pain radiates up to his left shoulder.  While he is experiencing chest pain, he will also have shortness of breath, dizziness and vision changes.  He denies any LOC.    [name] also notes that he has been experiencing both black and bright red stools for several weeks now. He has a prior history of anemia and GI bleeds with both endoscopy and colonoscopy that have not been revealing.  He notes that most recently, he has intermittent black stool and occasionally bright red stool that will come out as diarrhea with stool in the bowl and mixed in.  No nausea, vomiting. He denies any recent NSAID use. He notes a history of chronic iron deficiency anemia of uncertain etiology for which she requires iron transfusions every 4 to 6 months.    ED course: On arrival to the ED, patient was hypertensive at 159/83 with heart rate of 76.  He was saturating at 97% on room air.  He was afebrile at 98.4.  Initial workup notable for hemoglobin of 7.6, MCV of 75, potassium of 3.2, creatinine 1.16 with GFR above 60, and troponin of 7.  COVID-19, influenza and RSV PCR negative. Chest x-ray with no acute cardiopulmonary disease.  Due to concern for symptomatic anemia, patient started on Protonix infusion, 1 unit of packed RBC ordered and [facility] contacted for admission.   3/16: Underwent 1 unit of blood transfusion with improvement  of hemoglobin from 7.6-7.9 which is inadequate improvement.  Evaluated by GI plan for EGD and colonoscopy in the morning.  Patient to be n.p.o. past midnight will stay on clear liquids for today.  Echo pending for stable angina workup  Assessment and Plan: * Symptomatic anemia Patient presenting with exertional chest pain and shortness of breath consistent with symptomatic anemia.  Last CBC check was 1 month ago with hemoglobin of 9.2, currently 7.6.  - Telemetry monitoring - 1 unit of packed RBCs ordered - Check posttransfusion CBC 2 hours after completion - Continue to transfuse if remains symptomatic or hemoglobin < 7  Upper GI bleed  Patient presenting with several week history of black stool and occasional bright red stool that comes out as urgent diarrhea consistent with upper GI source.  Patient has been states that GI bleeds are chronic and patient has had colonoscopies and endoscopies that have been unrevealing.  It appears a capsule study was scheduled but not completed in 2022.  Patient may benefit from capsule endoscopy versus double-balloon enteroscopy to evaluate for AVM.  - GI consulted; Plan for EGD and C-scope on 3/17, clear liquid today, NPO past midnight.  - Continue Protonix infusion initiated in ED - Clear liquid diet - N.p.o. after midnight  Stable angina :   Patient endorses a 3-week history of chest pain, shortness of breath and dizziness with exertion only, however during examination, patient was having some short bursts of chest pain while  resting.  EKG with no concerning findings and initial troponin negative.  Per chart review, patient had a history of EKG changes in 2007 concerning for obstructive CAD, however echocardiogram was normal and left heart cath was not performed.  Echocardiogram in 2021 with no regional wall motion abnormalities.   - Recheck EKG with recurrence of chest pain - Trial of GI cocktail given upper GI bleed - Echocardiogram-pending -  Repeat troponin-negative  Iron deficiency anemia Patient has a long-term history of iron deficiency anemia dating back to 2018 for which he follows with heme-onc.  Patient's husband was concern for possible pernicious anemia, however B12 levels have always been within normal limits so less likely.   -Iron panel and ferritin pending  Hashimoto's thyroiditis - Continue home levothyroxine  COPD (chronic obstructive pulmonary disease) ([facility]) - Continue home bronchodilators  Narcolepsy due to underlying condition without cataplexy - Holding home modafinil in the setting of chest pain  Essential hypertension - Restart home amlodipine and valsartan tomorrow    Subjective: Patient seen and examined this morning by bedside.  No overnight events.  Vital labs and imaging reviewed.  Patient received 1 unit of blood with improvement of hemoglobin from 7.6-7.9.  Patient had an episode of diarrhea last night with fresh visible blood.  Intermixed.  Chest pain improved echo pending.  Troponins have been negative  Physical Exam: Vitals:   05/08/22 0140 05/08/22 0143 05/08/22 0616 05/08/22 0806  BP: (!) 93/49 (!) 93/49 (!) 102/56 116/63  Pulse: 67 68 64 66  Resp: 16 16 18 16   Temp: 98.5 F (36.9 C) 98.5 F (36.9 C) 98 F (36.7 C) 97.9 F (36.6 C)  TempSrc: Oral  Oral   SpO2:  91% 97% 96%  Weight:      Height:       Physical Exam Constitutional:      Appearance: He is well-developed.  HENT:     Head: Normocephalic.  Eyes:     Pupils: Pupils are equal, round, and reactive to light.  Cardiovascular:     Rate and Rhythm: Normal rate.  Pulmonary:     Effort: Pulmonary effort is normal.  Abdominal:     General: Abdomen is flat. Bowel sounds are normal. There is no distension.     Palpations: Abdomen is soft.     Tenderness: There is no abdominal tenderness.     Hernia: No hernia is present.  Musculoskeletal:        General: Normal range of motion.     Cervical back: Normal range of  motion.  Skin:    General: Skin is warm.  Neurological:     General: No focal deficit present.     Mental Status: He is alert and oriented to person, place, and time. Mental status is at baseline.  Psychiatric:        Mood and Affect: Mood normal.        Behavior: Behavior normal.        Thought Content: Thought content normal.        Judgment: Judgment normal.      Data Reviewed:  There are no new results to review at this time.  Family Communication: none  Disposition: Status is: Observation The patient remains OBS appropriate and will d/c before 2 midnights.  Planned Discharge Destination: Home    Time spent: 35 minutes  Author: Oran Rein, MD 05/08/2022 10:27 AM  For on call review [email or web address].

## 2022-05-08 NOTE — Progress Notes (Signed)
       CROSS COVER NOTE  NAME: Randy Mclean Younger MRN: [id number] DOB : [birth year]    HPI/Events of Note   Report: Headache all day, no associated symptoms Improved briefly after toradol dose.  Has also been given tylenol and morphine throughout day without relief  On review of chart:VSS. Hgb remains stable at 8 post PRBC transfusion    Assessment and  Interventions   Assessment: R/o  hypoglycemia - BS 167 Plan: Fiorcet 2 tabs + previously ordered robaxin       Kathlene Cote NP [facility]

## 2022-05-09 ENCOUNTER — Encounter
Admission: EM | Disposition: A | Discharge status: Home / Self Care | Payer: Self-pay | Source: Home / Self Care | Attending: Internal Medicine

## 2022-05-09 ENCOUNTER — Inpatient Hospital Stay: Payer: No Typology Code available for payment source | Admitting: Anesthesiology

## 2022-05-09 ENCOUNTER — Encounter: Payer: Self-pay | Admitting: Internal Medicine

## 2022-05-09 DIAGNOSIS — D649 Anemia, unspecified: Secondary | ICD-10-CM | POA: Diagnosis not present

## 2022-05-09 HISTORY — PX: COLONOSCOPY: SHX5424

## 2022-05-09 HISTORY — PX: GIVENS CAPSULE STUDY: SHX5432

## 2022-05-09 HISTORY — PX: ESOPHAGOGASTRODUODENOSCOPY (EGD) WITH PROPOFOL: SHX5813

## 2022-05-09 LAB — CBC
HCT: 27.2 % — ABNORMAL LOW (ref 39.0–52.0)
Hemoglobin: 7.7 g/dL — ABNORMAL LOW (ref 13.0–17.0)
MCH: 21.2 pg — ABNORMAL LOW (ref 26.0–34.0)
MCHC: 28.3 g/dL — ABNORMAL LOW (ref 30.0–36.0)
MCV: 74.9 fL — ABNORMAL LOW (ref 80.0–100.0)
Platelets: 255 10*3/uL (ref 150–400)
RBC: 3.63 MIL/uL — ABNORMAL LOW (ref 4.22–5.81)
RDW: 20.5 % — ABNORMAL HIGH (ref 11.5–15.5)
WBC: 5.7 10*3/uL (ref 4.0–10.5)
nRBC: 0 % (ref 0.0–0.2)

## 2022-05-09 LAB — BASIC METABOLIC PANEL
Anion gap: 5 (ref 5–15)
BUN: 8 mg/dL (ref 6–20)
CO2: 28 mmol/L (ref 22–32)
Calcium: 7.9 mg/dL — ABNORMAL LOW (ref 8.9–10.3)
Chloride: 104 mmol/L (ref 98–111)
Creatinine, Ser: 1.19 mg/dL (ref 0.61–1.24)
GFR, Estimated: >60 mL/min (ref >60)
Glucose, Bld: 96 mg/dL (ref 70–99)
Potassium: 3.2 mmol/L — ABNORMAL LOW (ref 3.5–5.1)
Sodium: 137 mmol/L (ref 135–145)

## 2022-05-09 SURGERY — IMAGING PROCEDURE, GI TRACT, INTRALUMINAL, VIA CAPSULE
Anesthesia: Monitor Anesthesia Care

## 2022-05-09 SURGERY — ESOPHAGOGASTRODUODENOSCOPY (EGD) WITH PROPOFOL
Anesthesia: General

## 2022-05-09 MED ORDER — PHENYLEPHRINE 80 MCG/ML (10ML) SYRINGE FOR IV PUSH (FOR BLOOD PRESSURE SUPPORT)
PREFILLED_SYRINGE | INTRAVENOUS | Status: AC
  Filled 2022-05-09: qty 10

## 2022-05-09 MED ORDER — SODIUM CHLORIDE 0.9 % IV BOLUS
500.0000 mL | Freq: Once | INTRAVENOUS | Status: AC
  Administered 2022-05-09: 500 mL via INTRAVENOUS

## 2022-05-09 MED ORDER — PROPOFOL 10 MG/ML IV BOLUS
INTRAVENOUS | Status: DC | PRN
  Administered 2022-05-09: 80 mg via INTRAVENOUS
  Administered 2022-05-09: 20 mg via INTRAVENOUS

## 2022-05-09 MED ORDER — PROPOFOL 500 MG/50ML IV EMUL
INTRAVENOUS | Status: DC | PRN
  Administered 2022-05-09: 200 ug/kg/min via INTRAVENOUS

## 2022-05-09 MED ORDER — PROPOFOL 10 MG/ML IV BOLUS
INTRAVENOUS | Status: AC
  Filled 2022-05-09: qty 20

## 2022-05-09 MED ORDER — PHENYLEPHRINE HCL (PRESSORS) 10 MG/ML IV SOLN
INTRAVENOUS | Status: DC | PRN
  Administered 2022-05-09 (×3): 160 ug via INTRAVENOUS

## 2022-05-09 MED ORDER — SODIUM CHLORIDE 0.9 % IV SOLN: INTRAVENOUS | Status: DC

## 2022-05-09 MED ORDER — EPHEDRINE 5 MG/ML INJ
INTRAVENOUS | Status: AC
  Filled 2022-05-09: qty 5

## 2022-05-09 MED ORDER — KETOROLAC TROMETHAMINE 15 MG/ML IJ SOLN
15.0000 mg | Freq: Three times a day (TID) | INTRAMUSCULAR | Status: DC | PRN
  Administered 2022-05-09: 15 mg via INTRAVENOUS
  Filled 2022-05-09: qty 1

## 2022-05-09 MED ORDER — EPHEDRINE SULFATE (PRESSORS) 50 MG/ML IJ SOLN
INTRAMUSCULAR | Status: DC | PRN
  Administered 2022-05-09 (×2): 5 mg via INTRAVENOUS

## 2022-05-09 MED ORDER — LIDOCAINE HCL (PF) 2 % IJ SOLN
INTRAMUSCULAR | Status: AC
  Filled 2022-05-09: qty 5

## 2022-05-09 MED ORDER — DEXMEDETOMIDINE HCL IN NACL 80 MCG/20ML IV SOLN
INTRAVENOUS | Status: DC | PRN
  Administered 2022-05-09: 8 ug via BUCCAL

## 2022-05-09 MED ORDER — LIDOCAINE 2% (20 MG/ML) 5 ML SYRINGE
INTRAMUSCULAR | Status: DC | PRN
  Administered 2022-05-09: 100 mg via INTRAVENOUS

## 2022-05-09 MED ORDER — POTASSIUM CHLORIDE 10 MEQ/100ML IV SOLN
10.0000 meq | INTRAVENOUS | Status: AC
  Administered 2022-05-09 (×3): 10 meq via INTRAVENOUS
  Filled 2022-05-09 (×2): qty 100

## 2022-05-09 NOTE — Op Note (Signed)
[facility] Gastroenterology Patient Name: Randy Mclean Procedure Date: 05/09/2022 10:15 AM MRN: [id number] Account #: [id number] Date of Birth: 05-06-1960 Admit Type: Inpatient Age: 62 Room: [facility] ENDO ROOM 4 Gender: Male Note Status: Finalized Instrument Name: Michaelle Birks [id number] Procedure:             Upper GI endoscopy Indications:           Melena Providers:             [name] DO, DO Referring MD:          Jonetta Osgood (Referring MD) Medicines:             Monitored Anesthesia Care Complications:         No immediate complications. Estimated blood loss: None. Procedure:             Pre-Anesthesia Assessment:                        - Prior to the procedure, a History and Physical was                         performed, and patient medications and allergies were                         reviewed. The patient is competent. The risks and                         benefits of the procedure and the sedation options and                         risks were discussed with the patient. All questions                         were answered and informed consent was obtained.                         Patient identification and proposed procedure were                         verified by the physician, the nurse, the anesthetist                         and the technician in the endoscopy suite. Mental                         Status Examination: alert and oriented. Airway                         Examination: normal oropharyngeal airway and neck                         mobility. Respiratory Examination: clear to                         auscultation. CV Examination: RRR, no murmurs, no S3                         or S4. Prophylactic Antibiotics: The patient does not  require prophylactic antibiotics. Prior                         Anticoagulants: The patient has taken no anticoagulant                         or antiplatelet agents. ASA  Grade Assessment: III - A                         patient with severe systemic disease. After reviewing                         the risks and benefits, the patient was deemed in                         satisfactory condition to undergo the procedure. The                         anesthesia plan was to use monitored anesthesia care                         (MAC). Immediately prior to administration of                         medications, the patient was re-assessed for adequacy                         to receive sedatives. The heart rate, respiratory                         rate, oxygen saturations, blood pressure, adequacy of                         pulmonary ventilation, and response to care were                         monitored throughout the procedure. The physical                         status of the patient was re-assessed after the                         procedure.                        After obtaining informed consent, the endoscope was                         passed under direct vision. Throughout the procedure,                         the patient's blood pressure, pulse, and oxygen                         saturations were monitored continuously. The Endoscope                         was introduced through the mouth, and advanced to the  second part of duodenum. The upper GI endoscopy was                         accomplished without difficulty. The patient tolerated                         the procedure well. Findings:      The duodenal bulb, first portion of the duodenum and second portion of       the duodenum were normal. Estimated blood loss: none.      A Dieulafoy lesion with no bleeding and no stigmata of recent bleeding       was found in the cardia. Area of redness in cardia. Not convincing this       was a true dieulafoy. This area was at the apex of the cardia and unable       to be reached by clip. Tattoo placed near this area Area was tattooed        with an injection of 1 mL of [place] ink. Estimated blood loss: none.      The exam of the stomach was otherwise normal.      The Z-line was variable and was found and <1cm.      Esophagogastric landmarks were identified: the gastroesophageal junction       was found at 40 cm from the incisors.      The exam of the esophagus was otherwise normal. Impression:            - Normal duodenal bulb, first portion of the duodenum                         and second portion of the duodenum.                        - Dieulafoy lesion of stomach.                        - Z-line variable, and <1cm.                        - Esophagogastric landmarks identified.                        - No specimens collected. Recommendation:        - Return patient to hospital ward for ongoing care.                        - NPO.                        - Continue present medications.                        - Will perform colonoscopy and capsule. Procedure Code(s):     --- Professional ---                        [phone], Esophagogastroduodenoscopy, flexible,                         transoral; with directed submucosal injection(s), any  substance Diagnosis Code(s):     --- Professional ---                        K31.82, Dieulafoy lesion (hemorrhagic) of stomach and                         duodenum                        K22.89, Other specified disease of esophagus                        K92.1, Melena (includes Hematochezia) CPT copyright 2022 American Medical Association. All rights reserved. The codes documented in this report are preliminary and upon coder review may  be revised to meet current compliance requirements. Attending Participation:      I personally performed the entire procedure. Volney American, DO [name] DO, DO 05/09/2022 11:32:17 AM This report has been signed electronically. Number of Addenda: 0 Note Initiated On: 05/09/2022 10:15 AM Total Procedure Duration: 0  hours 0 minutes 15 seconds  Estimated Blood Loss:  Estimated blood loss: none.      [facility]

## 2022-05-09 NOTE — Progress Notes (Signed)
Post GI op note  EGD and colonoscopy relatively unremarkable.  No signs of fresh or old blood within the gastric duodenal or colonic lumen.  No blood in the terminal ileum  Patient is currently undergoing capsule endoscopy That is now been 3 hours since starting and he can start clear liquids Can have regular diet for dinner tonight  Will review capsule study upon finishing and downloading  Continue to monitor H&H.  Transfusion and resuscitation as per primary team If capsule is unrevealing and hemoglobin continues to decrease can consider tagged RBC scan  Recommend continued IV iron supplementation  Celiac panel pending  Ronne Binning, [facility] Gastroenterology

## 2022-05-09 NOTE — Op Note (Signed)
[facility] Gastroenterology Patient Name: Randy Mclean Procedure Date: 05/09/2022 10:14 AM MRN: [id number] Account #: [id number] Date of Birth: [birth year] Admit Type: Inpatient Age: 62 Room: [facility] ENDO ROOM 4 Gender: Male Note Status: Finalized Instrument Name: Peds Colonoscope [id number] Procedure:             Colonoscopy Indications:           Hematochezia, Melena Providers:             [name] DO, DO Referring MD:          Jonetta Osgood (Referring MD) Medicines:             Monitored Anesthesia Care Complications:         No immediate complications. Estimated blood loss:                         Minimal. Procedure:             Pre-Anesthesia Assessment:                        - Prior to the procedure, a History and Physical was                         performed, and patient medications and allergies were                         reviewed. The patient is competent. The risks and                         benefits of the procedure and the sedation options and                         risks were discussed with the patient. All questions                         were answered and informed consent was obtained.                         Patient identification and proposed procedure were                         verified by the physician, the nurse, the anesthetist                         and the technician in the endoscopy suite. Mental                         Status Examination: alert and oriented. Airway                         Examination: normal oropharyngeal airway and neck                         mobility. Respiratory Examination: clear to                         auscultation. CV Examination: RRR, no murmurs, no S3  or S4. Prophylactic Antibiotics: The patient does not                         require prophylactic antibiotics. Prior                         Anticoagulants: The patient has taken no anticoagulant                          or antiplatelet agents. ASA Grade Assessment: III - A                         patient with severe systemic disease. After reviewing                         the risks and benefits, the patient was deemed in                         satisfactory condition to undergo the procedure. The                         anesthesia plan was to use monitored anesthesia care                         (MAC). Immediately prior to administration of                         medications, the patient was re-assessed for adequacy                         to receive sedatives. The heart rate, respiratory                         rate, oxygen saturations, blood pressure, adequacy of                         pulmonary ventilation, and response to care were                         monitored throughout the procedure. The physical                         status of the patient was re-assessed after the                         procedure.                        After obtaining informed consent, the colonoscope was                         passed under direct vision. Throughout the procedure,                         the patient's blood pressure, pulse, and oxygen                         saturations were monitored continuously. The  Colonoscope was introduced through the anus and                         advanced to the the terminal ileum, with                         identification of the appendiceal orifice and IC                         valve. The colonoscopy was performed without                         difficulty. The patient tolerated the procedure well.                         The quality of the bowel preparation was evaluated                         using the BBPS [facility] Bowel Preparation Scale) with                         scores of: Right Colon = 2 (minor amount of residual                         staining, small fragments of stool and/or opaque                         liquid, but mucosa seen  well), Transverse Colon = 2                         (minor amount of residual staining, small fragments of                         stool and/or opaque liquid, but mucosa seen well) and                         Left Colon = 3 (entire mucosa seen well with no                         residual staining, small fragments of stool or opaque                         liquid). The total BBPS score equals 7. The quality of                         the bowel preparation was good. The terminal ileum,                         ileocecal valve, appendiceal orifice, and rectum were                         photographed. Findings:      The perianal and digital rectal examinations were normal. Pertinent       negatives include normal sphincter tone.      The terminal ileum appeared normal. Estimated blood loss: none. no fresh       or old blood appreciated  Retroflexion in the right colon was performed.      Normal mucosa was found in the entire colon. no fresh or old blood noted       within the colon lumen Biopsies for histology were taken with a cold       forceps from the right colon and left colon for evaluation of       microscopic colitis. Estimated blood loss was minimal.      Multiple small-mouthed diverticula were found in the recto-sigmoid colon       and sigmoid colon. Estimated blood loss: none.      Non-bleeding internal hemorrhoids were found during retroflexion. The       hemorrhoids were Grade II (internal hemorrhoids that prolapse but reduce       spontaneously). Estimated blood loss: none.      The exam was otherwise without abnormality on direct and retroflexion       views.      stool light brown throughout the oclon. no blood fresh or old noted       within the lumen. Estimated blood loss: none. Impression:            - The examined portion of the ileum was normal.                        - Normal mucosa in the entire examined colon. Biopsied.                        - Diverticulosis in  the recto-sigmoid colon and in the                         sigmoid colon.                        - Non-bleeding internal hemorrhoids.                        - The examination was otherwise normal on direct and                         retroflexion views. Recommendation:        - Return patient to hospital ward for ongoing care.                        - NPO.                        - Continue present medications.                        - Proceed with capsule endoscopy                        continue iv iron infusions Procedure Code(s):     --- Professional ---                        [phone], Colonoscopy, flexible; with biopsy, single or                         multiple Diagnosis Code(s):     --- Professional ---  K64.1, Second degree hemorrhoids                        K92.1, Melena (includes Hematochezia)                        K57.30, Diverticulosis of large intestine without                         perforation or abscess without bleeding CPT copyright 2022 American Medical Association. All rights reserved. The codes documented in this report are preliminary and upon coder review may  be revised to meet current compliance requirements. Attending Participation:      I personally performed the entire procedure. Volney American, DO [name] DO, DO 05/09/2022 11:39:30 AM This report has been signed electronically. Number of Addenda: 0 Note Initiated On: 05/09/2022 10:14 AM Scope Withdrawal Time: 0 hours 10 minutes 58 seconds  Total Procedure Duration: 0 hours 20 minutes 39 seconds  Estimated Blood Loss:  Estimated blood loss was minimal.      [facility]

## 2022-05-09 NOTE — Transfer of Care (Signed)
Immediate Anesthesia Transfer of Care Note  Patient: Randy Mclean Younger  Procedure(s) Performed: ESOPHAGOGASTRODUODENOSCOPY (EGD) WITH PROPOFOL COLONOSCOPY  Patient Location: PACU  Anesthesia Type:General  Level of Consciousness: awake  Airway & Oxygen Therapy: Patient Spontanous Breathing and Patient connected to nasal cannula oxygen  Post-op Assessment: Report given to RN and Post -op Vital signs reviewed and stable  Post vital signs: Reviewed and stable  Last Vitals:  Vitals Value Taken Time  BP 99/63 05/09/22 1124  Temp 37.1 C 05/09/22 1121  Pulse 84 05/09/22 1124  Resp 23 05/09/22 1124  SpO2 95 % 05/09/22 1124  Vitals shown include unvalidated device data.  Last Pain:  Vitals:   05/09/22 1121  TempSrc: Temporal  PainSc: Asleep         Complications: No notable events documented.

## 2022-05-09 NOTE — Progress Notes (Signed)
Progress Note   Patient: Randy Mclean [id number] DOB: 09/23/1960 DOA: 05/07/2022     1 DOS: the patient was seen and examined on 05/09/2022   Brief hospital course:  Randy Mclean is a 62 y.o. male with medical history significant of hypertension, hyperlipidemia, iron deficiency anemia on IV iron, MI (age of 19), hypothyroidism, seizures, who presents to the ED due to chest pain.   Randy Mclean states that for the last 3 weeks, he has been experiencing persistent substernal chest pain that occurs when he is ambulating or exerting self. Symptoms improve with rest. The chest pain radiates up to his left shoulder.  While he is experiencing chest pain, he will also have shortness of breath, dizziness and vision changes.  He denies any LOC.    Randy Mclean also notes that he has been experiencing both black and bright red stools for several weeks now. He has a prior history of anemia and GI bleeds with both endoscopy and colonoscopy that have not been revealing.  He notes that most recently, he has intermittent black stool and occasionally bright red stool that will come out as diarrhea with stool in the bowl and mixed in.  No nausea, vomiting. He denies any recent NSAID use. He notes a history of chronic iron deficiency anemia of uncertain etiology for which she requires iron transfusions every 4 to 6 months.    ED course: On arrival to the ED, patient was hypertensive at 159/83 with heart rate of 76.  He was saturating at 97% on room air.  He was afebrile at 98.4.  Initial workup notable for hemoglobin of 7.6, MCV of 75, potassium of 3.2, creatinine 1.16 with GFR above 60, and troponin of 7.  COVID-19, influenza and RSV PCR negative. Chest x-ray with no acute cardiopulmonary disease.  Due to concern for symptomatic anemia, patient started on Protonix infusion, 1 unit of packed RBC ordered and [facility] contacted for admission.   3/16: Underwent 1 unit of blood transfusion with improvement  of hemoglobin from 7.6-7.9 which is inadequate improvement.  Evaluated by GI plan for EGD and colonoscopy in the morning.  Patient to be n.p.o. past midnight will stay on clear liquids for today.  Echo pending for stable angina workup  3/17 : Patient underwent EGD today, showed A Dieulafoy lesion with no bleeding and no stigmata of recent bleeding was found in the cardia. Area of redness in cardia. Not convincing this was a true dieulafoy. This area was at the apex of the cardia and unable  to be reached by clip.  Patient was kept n.p.o. for colonoscopy.    Assessment and Plan: * Symptomatic anemia Patient presenting with exertional chest pain and shortness of breath consistent with symptomatic anemia.  Last CBC check was 1 month ago with hemoglobin of 9.2, currently 7.6.  - Telemetry monitoring - Underwent 1 unit of blood transfusion has been stable posttransfusion. - Check posttransfusion CBC 2 hours after completion - Continue to transfuse if remains symptomatic or hemoglobin < 7  Upper GI bleed  Patient presenting with several week history of black stool and occasional bright red stool that comes out as urgent diarrhea consistent with upper GI source.  Patient has been states that GI bleeds are chronic and patient has had colonoscopies and endoscopies that have been unrevealing.  It appears a capsule study was scheduled but not completed in 2022.  Patient may benefit from capsule endoscopy versus double-balloon enteroscopy to evaluate for AVM.  -  GI consulted; Plan for EGD and C-scope on 3/17, EGD showed A Dieulafoy lesion with no bleeding and no stigmata of recent bleeding was found in the cardia. Area of redness in cardia. Not convincing this was a true dieulafoy. This area was at the apex of the cardia and unable  to be reached by clip.  - Continue Protonix infusion initiated in ED - Clear liquid diet - N.p.o. after midnight  Stable angina :   Patient endorses a 3-week history of chest  pain, shortness of breath and dizziness with exertion only, however during examination, patient was having some short bursts of chest pain while resting.  EKG with no concerning findings and initial troponin negative.  Per chart review, patient had a history of EKG changes in 2007 concerning for obstructive CAD, however echocardiogram was normal and left heart cath was not performed.  Echocardiogram in 2021 with no regional wall motion abnormalities.   - Recheck EKG with recurrence of chest pain - Trial of GI cocktail given upper GI bleed - Echocardiogram-pending - Repeat troponin-negative  Iron deficiency anemia Patient has a long-term history of iron deficiency anemia dating back to 2018 for which he follows with heme-onc.  Patient's husband was concern for possible pernicious anemia, however B12 levels have always been within normal limits so less likely.   -Iron panel and ferritin pending  Hashimoto's thyroiditis - Continue home levothyroxine  COPD (chronic obstructive pulmonary disease) (HCC) - Continue home bronchodilators  Narcolepsy due to underlying condition without cataplexy - Holding home modafinil in the setting of chest pain  Essential hypertension - Restart home amlodipine and valsartan after EGD/C scope  Subjective: Patient seen and examined this morning by bedside.  No overnight events.  Vital labs and imaging reviewed.  Patient received 1 unit of blood with improvement of hemoglobin from 7.6-8.0.  Complains of headache for which she received Toradol and Fioricet.  Physical Exam: Vitals:   05/09/22 0837 05/09/22 1100 05/09/22 1121 05/09/22 1141  BP: 123/63  (!) 83/55 (!) 102/58  Pulse: 85  81   Resp: 18  (!) 26 15  Temp: 99.9 F (37.7 C) 98.8 F (37.1 C) 98.8 F (37.1 C)   TempSrc: Oral Temporal Temporal   SpO2: 97%  (!) 89%   Weight:      Height:       Physical Exam Constitutional:      Appearance: He is well-developed.  HENT:     Head: Normocephalic.   Eyes:     Pupils: Pupils are equal, round, and reactive to light.  Cardiovascular:     Rate and Rhythm: Normal rate.  Pulmonary:     Effort: Pulmonary effort is normal.  Abdominal:     General: Abdomen is flat. Bowel sounds are normal. There is no distension.     Palpations: Abdomen is soft.     Tenderness: There is no abdominal tenderness.     Hernia: No hernia is present.  Musculoskeletal:        General: Normal range of motion.     Cervical back: Normal range of motion.  Skin:    General: Skin is warm.  Neurological:     General: No focal deficit present.     Mental Status: He is alert and oriented to person, place, and time. Mental status is at baseline.  Psychiatric:        Mood and Affect: Mood normal.        Behavior: Behavior normal.  Thought Content: Thought content normal.        Judgment: Judgment normal.      Data Reviewed:  There are no new results to review at this time.  Family Communication: none  Disposition: Status is: Observation The patient remains OBS appropriate and will d/c before 2 midnights.  Planned Discharge Destination: Home    Time spent: 35 minutes  Author: Oran Rein, MD 05/09/2022 1:40 PM  For on call review [email or web address].

## 2022-05-09 NOTE — Interval H&P Note (Signed)
History and Physical Interval Note: Preprocedure H&P from 05/09/22  was reviewed and there was no interval change after seeing and examining the patient.  Written consent was obtained from the patient after discussion of risks, benefits, and alternatives. Patient has consented to proceed with Esophagogastroduodenoscopy, Colonoscopy, and Video capsule endoscopy  with possible intervention  Esophagogastroduodenoscopy, Colonoscopy and [place] Video capsule endoscopy with possible biopsy, control of bleeding, polypectomy, and interventions as necessary has been discussed with the patient/patient representative. Informed consent was obtained from the patient/patient representative after explaining the indication, nature, and risks of the procedure including but not limited to death, bleeding, perforation, missed neoplasm/lesions, cardiorespiratory compromise, and reaction to medications. Opportunity for questions was given and appropriate answers were provided. Patient/patient representative has verbalized understanding is amenable to undergoing the procedure.    05/09/2022 10:20 AM  Randy Mclean  has presented today for surgery, with the diagnosis of Symptomatic anemia, IDA, rectal bleeding.  The various methods of treatment have been discussed with the patient and family. After consideration of risks, benefits and other options for treatment, the patient has consented to  Procedure(s): ESOPHAGOGASTRODUODENOSCOPY (EGD) WITH PROPOFOL (N/A) COLONOSCOPY (N/A) as a surgical intervention.  The patient's history has been reviewed, patient examined, no change in status, stable for surgery.  I have reviewed the patient's chart and labs.  Questions were answered to the patient's satisfaction.     [name]

## 2022-05-09 NOTE — Progress Notes (Signed)
Mobility Specialist - Progress Note   05/09/22 0941  Mobility  Activity Ambulated independently in hallway;Stood at bedside;Dangled on edge of bed  Level of Assistance Independent  Assistive Device None  Distance Ambulated (ft) 120 ft  Activity Response Tolerated well  Mobility Referral Yes  $Mobility charge 1 Mobility   Pt supine in bed on RA upon arrival. Pt STS and ambulates in hallway indep. Pt does desat to SPO2 88 and need a standing rest break. Pt SPO2 rises back to 95 within 1 minute of rest. RN notified. Pt left in bed with needs in reach.   Gretchen Short  Mobility Specialist  05/09/22 10:19 AM

## 2022-05-09 NOTE — Anesthesia Preprocedure Evaluation (Signed)
Anesthesia Evaluation  Patient identified by MRN, date of birth, ID band Patient awake    Reviewed: Allergy & Precautions, NPO status , Patient's Chart, lab work & pertinent test results, reviewed documented beta blocker date and time   History of Anesthesia Complications Negative for: history of anesthetic complications  Airway Mallampati: II  TM Distance: >3 FB     Dental  (+) Dental Advidsory Given, Caps, Teeth Intact   Pulmonary shortness of breath (due to anemia), asthma , neg sleep apnea, COPD, neg recent URI, former smoker   Pulmonary exam normal        Cardiovascular hypertension, Pt. on medications and Pt. on home beta blockers + angina (on admission, heart checked out fine, due to anemia)  + Past MI  (-) Cardiac Stents and (-) CABG Normal cardiovascular exam(-) dysrhythmias (-) Valvular Problems/Murmurs     Neuro/Psych  Headaches, Seizures -,  PSYCHIATRIC DISORDERS Anxiety Depression     Neuromuscular disease    GI/Hepatic Neg liver ROS,GERD  Medicated,,  Endo/Other  neg diabetesHypothyroidism    Renal/GU CRFRenal disease     Musculoskeletal  (+) Arthritis , Osteoarthritis,    Abdominal Normal abdominal exam  (+)   Peds negative pediatric ROS (+)  Hematology  (+) Blood dyscrasia, anemia   Anesthesia Other Findings Past Medical History: 12/04/2017: Acute appendicitis No date: Allergy     Comment:  takes allergy shots No date: Anemia No date: Arthritis No date: Asthma No date: Barrett esophagus No date: Bronchitis No date: Chronic pain No date: Colon polyp No date: Depression No date: GERD (gastroesophageal reflux disease) No date: Hemorrhoid No date: Hyperlipidemia No date: Hypertension No date: Hypothyroid No date: IBS (irritable bowel syndrome) No date: Kidney stone No date: MI (myocardial infarction) ([place])     Comment:  between 2002 and 2006 No date: Migraine No date: Pneumonia No date:  Seizures ([place]) No date: Sinus problem No date: Spinal cord stimulator status   Reproductive/Obstetrics                             Anesthesia Physical Anesthesia Plan  ASA: 3  Anesthesia Plan: General   Post-op Pain Management:    Induction: Intravenous  PONV Risk Score and Plan: Propofol infusion and TIVA  Airway Management Planned: Natural Airway and Nasal Cannula  Additional Equipment:   Intra-op Plan:   Post-operative Plan:   Informed Consent: I have reviewed the patients History and Physical, chart, labs and discussed the procedure including the risks, benefits and alternatives for the proposed anesthesia with the patient or authorized representative who has indicated his/her understanding and acceptance.     Dental advisory given  Plan Discussed with: CRNA and Surgeon  Anesthesia Plan Comments:         Anesthesia Quick Evaluation

## 2022-05-09 NOTE — Anesthesia Postprocedure Evaluation (Signed)
Anesthesia Post Note  Patient: Randy Mclean  Procedure(s) Performed: ESOPHAGOGASTRODUODENOSCOPY (EGD) WITH PROPOFOL COLONOSCOPY  Patient location during evaluation: Endoscopy Anesthesia Type: General Level of consciousness: awake and alert Pain management: pain level controlled Vital Signs Assessment: post-procedure vital signs reviewed and stable Respiratory status: spontaneous breathing, nonlabored ventilation, respiratory function stable and patient connected to nasal cannula oxygen Cardiovascular status: blood pressure returned to baseline and stable Postop Assessment: no apparent nausea or vomiting Anesthetic complications: no   No notable events documented.   Last Vitals:  Vitals:   05/09/22 1121 05/09/22 1141  BP: (!) 83/55 (!) 102/58  Pulse: 81   Resp: (!) 26 15  Temp: 37.1 C   SpO2: (!) 89%     Last Pain:  Vitals:   05/09/22 1413  TempSrc:   PainSc: 6                  Martha Clan

## 2022-05-10 ENCOUNTER — Other Ambulatory Visit: Payer: Self-pay | Admitting: *Deleted

## 2022-05-10 ENCOUNTER — Encounter: Payer: Self-pay | Admitting: Gastroenterology

## 2022-05-10 DIAGNOSIS — D649 Anemia, unspecified: Secondary | ICD-10-CM | POA: Diagnosis not present

## 2022-05-10 DIAGNOSIS — D508 Other iron deficiency anemias: Secondary | ICD-10-CM

## 2022-05-10 LAB — CBC
HCT: 27.5 % — ABNORMAL LOW (ref 39.0–52.0)
Hemoglobin: 7.9 g/dL — ABNORMAL LOW (ref 13.0–17.0)
MCH: 21.9 pg — ABNORMAL LOW (ref 26.0–34.0)
MCHC: 28.7 g/dL — ABNORMAL LOW (ref 30.0–36.0)
MCV: 76.2 fL — ABNORMAL LOW (ref 80.0–100.0)
Platelets: 245 10*3/uL (ref 150–400)
RBC: 3.61 MIL/uL — ABNORMAL LOW (ref 4.22–5.81)
RDW: 20.7 % — ABNORMAL HIGH (ref 11.5–15.5)
WBC: 5.6 10*3/uL (ref 4.0–10.5)
nRBC: 0 % (ref 0.0–0.2)

## 2022-05-10 MED ORDER — FERROUS SULFATE 325 (65 FE) MG PO TBEC: 325.0000 mg | DELAYED_RELEASE_TABLET | ORAL | 1 refills | Status: DC

## 2022-05-10 NOTE — TOC CM/SW Note (Signed)
  Transition of Care [facility]) Screening Note   Patient Details  Name: Randy Mclean Date of Birth: March 03, 1960   Transition of Care [facility]) CM/SW Contact:    Gerilyn Pilgrim, LCSW Phone Number: 05/10/2022, 2:21 PM    Transition of Care Department [facility]) has reviewed patient and no TOC needs have been identified at this time. We will continue to monitor patient advancement through interdisciplinary progression rounds. If new patient transition needs arise, please place a TOC consult.

## 2022-05-10 NOTE — Discharge Summary (Signed)
Randy Mclean Younger [id number] DOB: 1960-08-12 DOA: 05/07/2022  PCP: Jonetta Osgood, NP  Admit date: 05/07/2022 Discharge date: 05/10/2022  Time spent: 40 minutes  Recommendations for Outpatient Follow-up:  Pcp, cardiology, and hematology f/u     Discharge Diagnoses:  Principal Problem:   Symptomatic anemia Active Problems:   Upper GI bleed   Stable angina   Iron deficiency anemia   Essential hypertension   Narcolepsy due to underlying condition without cataplexy   COPD (chronic obstructive pulmonary disease) ([place])   Hashimoto's thyroiditis   Discharge Condition: stable  Diet recommendation: heart healthy  Filed Weights   05/07/22 1525  Weight: 98.9 kg    History of present illness:  From admission h and p Randy Mclean Younger is a 62 y.o. male with medical history significant of hypertension, hyperlipidemia, iron deficiency anemia on IV iron, MI (age of 85), hypothyroidism, seizures, who presents to the ED due to chest pain.   [name] states that for the last 3 weeks, he has been experiencing persistent substernal chest pain that occurs when he is ambulating or exerting self. Symptoms improve with rest. The chest pain radiates up to his left shoulder.  While he is experiencing chest pain, he will also have shortness of breath, dizziness and vision changes.  He denies any LOC.    [name] also notes that he has been experiencing both black and bright red stools for several weeks now. He has a prior history of anemia and GI bleeds with both endoscopy and colonoscopy that have not been revealing.  He notes that most recently, he has intermittent black stool and occasionally bright red stool that will come out as diarrhea with stool in the bowl and mixed in.  No nausea, vomiting. He denies any recent NSAID use. He notes a history of chronic iron deficiency anemia of uncertain etiology for which she requires iron transfusions every 4 to 6 months.   Hospital  Course:  Presents with chest pain. Hx CAD. W/u including serial troponins, EKG, and echocardiogram all unremarkable. Has had several weeks intermittent hematochezia and melena and here was found to have acute on chronic iron deficiency anemia with hgb in 7s from 9s 2 months ago. Transfused one unit. No bleeding while hospitalized. Evaluated with upper endoscopy, colonoscopy. Those showed diverticulosis, internal hemorrhoids, and a dieulafoy lesion, no source of bleeding identified. Capsule endoscopy then also performed which was negative. GI advises continue PPI and f/u outpatient with hematology who patient follows with for chronic iron deficiency anemia. Hgb stable here in the 7s. Advise outpatient cardiology f/u and hematology f/u.   Procedures: Upper endoscopy, colonoscopy, capsule endoscopy   Consultations: GI  Discharge Exam: Vitals:   05/10/22 0838 05/10/22 1138  BP: 138/64 138/60  Pulse:  78  Resp:  18  Temp:  98.5 F (36.9 C)  SpO2:  95%    General: NAD Cardiovascular: RRR Respiratory: CTAB Abdomen: soft, non-tender  Discharge Instructions   Discharge Instructions     Diet - low sodium heart healthy   Complete by: As directed    Increase activity slowly   Complete by: As directed       Allergies as of 05/10/2022       Reactions   Amoxicillin Anaphylaxis   REACTION: unspecified   Penicillin G Anaphylaxis   Gabapentin Other (See Comments)   REACTION: anaphylaxis   Penicillins    REACTION: unspecified        Medication List     STOP  taking these medications    chlorpheniramine-HYDROcodone 10-8 MG/5ML Commonly known as: TUSSIONEX   magnesium gluconate 500 MG tablet Commonly known as: MAGONATE   predniSONE 10 MG tablet Commonly known as: DELTASONE   valsartan 160 MG tablet Commonly known as: DIOVAN       TAKE these medications    albuterol 108 (90 Base) MCG/ACT inhaler Commonly known as: VENTOLIN HFA Inhale 2 puffs into the lungs every 4  (four) hours as needed for wheezing or shortness of breath.   alfuzosin 10 MG 24 hr tablet Commonly known as: UROXATRAL TAKE 1 TABLET(10 MG) BY MOUTH DAILY WITH BREAKFAST What changed: See the new instructions.   ALPRAZolam 0.5 MG tablet Commonly known as: XANAX Take 1 tablet (0.5 mg total) by mouth 3 (three) times daily as needed for anxiety.   amLODipine 5 MG tablet Commonly known as: NORVASC Take 2 tablets (10 mg total) by mouth daily.   atorvastatin 10 MG tablet Commonly known as: LIPITOR TAKE 1 TABLET BY MOUTH DAILY   buPROPion 300 MG 24 hr tablet Commonly known as: WELLBUTRIN XL TAKE 1 TABLET BY MOUTH DAILY, GENERIC EQUIVALENT FOR WELLBUTRIN XL. DUE FOR OFFICE VISIT. What changed:  how much to take how to take this when to take this   citalopram 20 MG tablet Commonly known as: CELEXA Take 1 tablet (20 mg total) by mouth daily.   Dulera 100-5 MCG/ACT Aero Generic drug: mometasone-formoterol INHALE 2 PUFFS BY MOUTH INTO THE LUNGS EVERY 12 HOURS   esomeprazole 40 MG capsule Commonly known as: NEXIUM Take 40 mg by mouth 2 (two) times daily before a meal.   ferrous sulfate 325 (65 FE) MG EC tablet Take 1 tablet (325 mg total) by mouth every other day.   hydrochlorothiazide 12.5 MG tablet Commonly known as: HYDRODIURIL Take 1 tablet (12.5 mg total) by mouth daily.   levothyroxine 150 MCG tablet Commonly known as: SYNTHROID TAKE 1 TABLET BY MOUTH DAILY BEFORE BREAKFAST.   methocarbamol 750 MG tablet Commonly known as: ROBAXIN Take 750 mg by mouth 4 (four) times daily as needed.   modafinil 200 MG tablet Commonly known as: PROVIGIL Take 1 tablet (200 mg total) by mouth daily.   Multi-Vitamins Tabs Take 1 tablet by mouth daily.   pregabalin 100 MG capsule Commonly known as: LYRICA Take 100 mg by mouth every 8 (eight) hours.   SYRINGE-NEEDLE (DISP) 3 ML 22G X 1" 3 ML Misc Use as directed.   tadalafil 20 MG tablet Commonly known as: CIALIS Take 0.5-1  tablets (10-20 mg total) by mouth daily as needed for erectile dysfunction.   testosterone cypionate 200 MG/ML injection Commonly known as: DEPOTESTOSTERONE CYPIONATE INJECT 0.75ML ONCE A WEEK AS DIRECTED   tiZANidine 4 MG tablet Commonly known as: ZANAFLEX Take 4 mg by mouth every 6 (six) hours as needed for muscle spasms. Pt takes one tablet at night       Allergies  Allergen Reactions   Amoxicillin Anaphylaxis    REACTION: unspecified   Penicillin G Anaphylaxis   Gabapentin Other (See Comments)    REACTION: anaphylaxis   Penicillins     REACTION: unspecified    Follow-up Information     Jonetta Osgood, NP Follow up.   Specialty: Nurse Practitioner Contact information: [place] [address] [phone]         Lloyd Huger, MD Follow up.   Specialty: Oncology Contact information: [place] [address] [phone]  The results of significant diagnostics from this hospitalization (including imaging, microbiology, ancillary and laboratory) are listed below for reference.    Significant Diagnostic Studies: ECHOCARDIOGRAM COMPLETE  Result Date: 05/08/2022    ECHOCARDIOGRAM REPORT   Patient Name:   Randy Mclean Date of Exam: 05/08/2022 Medical Rec #:  [id number]              Height:       72.0 in Accession #:    [id number]             Weight:       218.0 lb Date of Birth:  11-Aug-1960              BSA:          2.210 m Patient Age:    63 years               BP:           132/70 mmHg Patient Gender: M                      HR:           73 bpm. Exam Location:  [facility] Procedure: 2D Echo, Color Doppler and Cardiac Doppler Indications:     Chest Pain R07.9  History:         Patient has prior history of Echocardiogram examinations.                  Previous Myocardial Infarction, Asthma; Risk                  Factors:Hypertension and Hyperlipidemia.  Sonographer:     L. Thornton-Maynard Referring Phys:   [id number] Jose Persia Diagnosing Phys: Yolonda Kida MD IMPRESSIONS  1. Left ventricular ejection fraction, by estimation, is 65 to 70%. The left ventricle has normal function. The left ventricle has no regional wall motion abnormalities. The left ventricular internal cavity size was mildly dilated. There is mild concentric left ventricular hypertrophy. Left ventricular diastolic parameters were normal.  2. Right ventricular systolic function is normal. The right ventricular size is normal.  3. Left atrial size was mildly dilated.  4. The mitral valve is normal in structure. Trivial mitral valve regurgitation.  5. The aortic valve is normal in structure. Aortic valve regurgitation is not visualized. FINDINGS  Left Ventricle: Left ventricular ejection fraction, by estimation, is 65 to 70%. The left ventricle has normal function. The left ventricle has no regional wall motion abnormalities. The left ventricular internal cavity size was mildly dilated. There is  mild concentric left ventricular hypertrophy. Left ventricular diastolic parameters were normal. Right Ventricle: The right ventricular size is normal. No increase in right ventricular wall thickness. Right ventricular systolic function is normal. Left Atrium: Left atrial size was mildly dilated. Right Atrium: Right atrial size was normal in size. Pericardium: There is no evidence of pericardial effusion. Mitral Valve: The mitral valve is normal in structure. Trivial mitral valve regurgitation. Tricuspid Valve: The tricuspid valve is normal in structure. Tricuspid valve regurgitation is trivial. Aortic Valve: The aortic valve is normal in structure. Aortic valve regurgitation is not visualized. Aortic valve mean gradient measures 8.5 mmHg. Aortic valve peak gradient measures 16.4 mmHg. Aortic valve area, by VTI measures 2.84 cm. Pulmonic Valve: The pulmonic valve was normal in structure. Pulmonic valve regurgitation is not visualized. Aorta: The  ascending aorta was not well visualized. IAS/Shunts: No atrial level shunt detected by color flow  Doppler.  LEFT VENTRICLE PLAX 2D LVIDd:         4.80 cm      Diastology LVIDs:         3.10 cm      LV e' medial:    10.10 cm/s LV PW:         1.40 cm      LV E/e' medial:  10.6 LV IVS:        1.40 cm      LV e' lateral:   12.60 cm/s LVOT diam:     2.00 cm      LV E/e' lateral: 8.5 LV SV:         107 LV SV Index:   48 LVOT Area:     3.14 cm  LV Volumes (MOD) LV vol d, MOD A2C: 80.9 ml LV vol d, MOD A4C: 100.0 ml LV vol s, MOD A2C: 16.4 ml LV vol s, MOD A4C: 27.3 ml LV SV MOD A2C:     64.5 ml LV SV MOD A4C:     100.0 ml LV SV MOD BP:      68.7 ml RIGHT VENTRICLE RV Basal diam:  3.90 cm RV S prime:     13.40 cm/s TAPSE (M-mode): 2.9 cm LEFT ATRIUM             Index        RIGHT ATRIUM           Index LA diam:        4.30 cm 1.95 cm/m   RA Area:     17.90 cm LA Vol (A2C):   94.6 ml 42.80 ml/m  RA Volume:   52.20 ml  23.62 ml/m LA Vol (A4C):   98.2 ml 44.43 ml/m LA Biplane Vol: 99.1 ml 44.84 ml/m  AORTIC VALVE                     PULMONIC VALVE AV Area (Vmax):    2.81 cm      PV Vmax:          1.33 m/s AV Area (Vmean):   2.72 cm      PV Peak grad:     7.1 mmHg AV Area (VTI):     2.84 cm      PR End Diast Vel: 4.33 msec AV Vmax:           202.50 cm/s AV Vmean:          131.500 cm/s AV VTI:            0.376 m AV Peak Grad:      16.4 mmHg AV Mean Grad:      8.5 mmHg LVOT Vmax:         181.00 cm/s LVOT Vmean:        114.000 cm/s LVOT VTI:          0.340 m LVOT/AV VTI ratio: 0.91  AORTA Ao Root diam: 3.50 cm Ao Asc diam:  2.90 cm MITRAL VALVE MV Area (PHT): 3.91 cm     SHUNTS MV Decel Time: 194 msec     Systemic VTI:  0.34 m MV E velocity: 107.00 cm/s  Systemic Diam: 2.00 cm MV A velocity: 95.40 cm/s MV E/A ratio:  1.12 Dwayne Prince Rome MD Electronically signed by Yolonda Kida MD Signature Date/Time: 05/08/2022/2:48:36 PM    Final    DG Chest Portable 1 View  Result Date: 05/07/2022 CLINICAL DATA:  Provided  history: Shortness of breath. EXAM: PORTABLE CHEST 1 VIEW COMPARISON:  Prior chest radiographs 06/19/2021 and earlier. FINDINGS: Heart size within normal limits. Aortic atherosclerosis. No appreciable airspace consolidation. No evidence of pleural effusion or pneumothorax. No acute osseous abnormality identified. Degenerative changes of the spine. Spinal stimulator leads. Partially imaged ACDF hardware. IMPRESSION: No evidence of acute cardiopulmonary abnormality. Aortic Atherosclerosis (ICD10-I70.0). Electronically Signed   By: Kellie Simmering D.O.   On: 05/07/2022 16:19    Microbiology: Recent Results (from the past 240 hour(s))  Resp panel by RT-PCR (RSV, Flu A&B, Covid) Anterior Nasal Swab     Status: None   Collection Time: 05/07/22  3:41 PM   Specimen: Anterior Nasal Swab  Result Value Ref Range Status   SARS Coronavirus 2 by RT PCR NEGATIVE NEGATIVE Final    Comment: (NOTE) SARS-CoV-2 target nucleic acids are NOT DETECTED.  The SARS-CoV-2 RNA is generally detectable in upper respiratory specimens during the acute phase of infection. The lowest concentration of SARS-CoV-2 viral copies this assay can detect is 138 copies/mL. A negative result does not preclude SARS-Cov-2 infection and should not be used as the sole basis for treatment or other patient management decisions. A negative result may occur with  improper specimen collection/handling, submission of specimen other than nasopharyngeal swab, presence of viral mutation(s) within the areas targeted by this assay, and inadequate number of viral copies(<138 copies/mL). A negative result must be combined with clinical observations, patient history, and epidemiological information. The expected result is Negative.  Fact Sheet for Patients:  EntrepreneurPulse.com.au  Fact Sheet for Healthcare Providers:  IncredibleEmployment.be  This test is no t yet approved or cleared by the [place] FDA and   has been authorized for detection and/or diagnosis of SARS-CoV-2 by FDA under an Emergency Use Authorization (EUA). This EUA will remain  in effect (meaning this test can be used) for the duration of the COVID-19 declaration under Section 564(b)(1) of the Act, 21 U.S.C.section 360bbb-3(b)(1), unless the authorization is terminated  or revoked sooner.       Influenza A by PCR NEGATIVE NEGATIVE Final   Influenza B by PCR NEGATIVE NEGATIVE Final    Comment: (NOTE) The Xpert Xpress SARS-CoV-2/FLU/RSV plus assay is intended as an aid in the diagnosis of influenza from Nasopharyngeal swab specimens and should not be used as a sole basis for treatment. Nasal washings and aspirates are unacceptable for Xpert Xpress SARS-CoV-2/FLU/RSV testing.  Fact Sheet for Patients: EntrepreneurPulse.com.au  Fact Sheet for Healthcare Providers: IncredibleEmployment.be  This test is not yet approved or cleared by the [place] FDA and has been authorized for detection and/or diagnosis of SARS-CoV-2 by FDA under an Emergency Use Authorization (EUA). This EUA will remain in effect (meaning this test can be used) for the duration of the COVID-19 declaration under Section 564(b)(1) of the Act, 21 U.S.C. section 360bbb-3(b)(1), unless the authorization is terminated or revoked.     Resp Syncytial Virus by PCR NEGATIVE NEGATIVE Final    Comment: (NOTE) Fact Sheet for Patients: EntrepreneurPulse.com.au  Fact Sheet for Healthcare Providers: IncredibleEmployment.be  This test is not yet approved or cleared by the [place] FDA and has been authorized for detection and/or diagnosis of SARS-CoV-2 by FDA under an Emergency Use Authorization (EUA). This EUA will remain in effect (meaning this test can be used) for the duration of the COVID-19 declaration under Section 564(b)(1) of the Act, 21 U.S.C. section 360bbb-3(b)(1),  unless the authorization is terminated or revoked.  Performed at [facility],  [place], [place] [address]      Labs: Basic Metabolic Panel: Recent Labs  Lab 05/07/22 1540 05/08/22 0550 05/09/22 0459  NA 141 139 137  K 3.2* 3.1* 3.2*  CL 102 105 104  CO2 29 29 28   GLUCOSE 94 84 96  BUN 11 9 8   CREATININE 1.16 1.18 1.19  CALCIUM 9.2 8.3* 7.9*   Liver Function Tests: Recent Labs  Lab 05/07/22 1540  AST 21  ALT 17  ALKPHOS 63  BILITOT 0.5  PROT 7.1  ALBUMIN 4.2   Recent Labs  Lab 05/07/22 1540  LIPASE 40   No results for input(s): "AMMONIA" in the last 168 hours. CBC: Recent Labs  Lab 05/07/22 1540 05/08/22 0550 05/08/22 1752 05/09/22 0459 05/10/22 0421  WBC 6.0 4.8 5.8 5.7 5.6  NEUTROABS  --  3.0  --   --   --   HGB 7.6* 7.9* 8.0* 7.7* 7.9*  HCT 27.6* 27.3* 27.6* 27.2* 27.5*  MCV 75.0* 74.6* 75.0* 74.9* 76.2*  PLT 320 271 271 255 245   Cardiac Enzymes: No results for input(s): "CKTOTAL", "CKMB", "CKMBINDEX", "TROPONINI" in the last 168 hours. BNP: BNP (last 3 results) No results for input(s): "BNP" in the last 8760 hours.  ProBNP (last 3 results) No results for input(s): "PROBNP" in the last 8760 hours.  CBG: Recent Labs  Lab 05/08/22 1942  GLUCAP 167*       Signed:  Desma Maxim MD.  [facility] 05/10/2022, 1:08 PM

## 2022-05-10 NOTE — Progress Notes (Signed)
GI post procedure note  Video capsule endoscopy downloaded and reviewed today No signs of active bleeding or source of bleeding No melena or fresh blood appreciated within the lumen 1 punctate area of irritation in the small bowel at approximately 45 minutes  Patient tolerating p.o.  He does still have some dyspnea on exertion Otherwise no further melena or hematochezia Hemoglobin is stable.  Vital signs are stable  Once a day po ppi.  Follow up with hematology for continued iron  If rebleed occurs recommend tagged RBC scan  No further GI intervention at this time  GI to sign off. Available as needed. Please do not hesitate to call regarding questions or concerns.  Ronne Binning, [facility]

## 2022-05-11 ENCOUNTER — Inpatient Hospital Stay: Payer: No Typology Code available for payment source | Admitting: Internal Medicine

## 2022-05-11 LAB — SURGICAL PATHOLOGY

## 2022-05-11 LAB — INTRINSIC FACTOR ANTIBODIES: Intrinsic Factor: 1.1 AU/mL (ref 0.0–1.1)

## 2022-05-12 LAB — CELIAC DISEASE PANEL
Endomysial Ab, IgA: NEGATIVE
IgA: 188 mg/dL (ref 90–386)
Tissue Transglutaminase Ab, IgA: <2 U/mL (ref 0–3)

## 2022-05-13 MED FILL — Iron Sucrose Inj 20 MG/ML (Fe Equiv): INTRAVENOUS | Qty: 10 | Status: AC

## 2022-05-14 ENCOUNTER — Inpatient Hospital Stay: Payer: No Typology Code available for payment source

## 2022-05-14 ENCOUNTER — Inpatient Hospital Stay: Payer: No Typology Code available for payment source | Admitting: Oncology

## 2022-05-19 ENCOUNTER — Other Ambulatory Visit: Payer: Self-pay | Admitting: Internal Medicine

## 2022-05-19 DIAGNOSIS — N401 Enlarged prostate with lower urinary tract symptoms: Secondary | ICD-10-CM

## 2022-05-19 NOTE — Telephone Encounter (Signed)
Can you please that its ok to send

## 2022-05-20 MED FILL — Iron Sucrose Inj 20 MG/ML (Fe Equiv): INTRAVENOUS | Qty: 10 | Status: AC

## 2022-05-21 ENCOUNTER — Inpatient Hospital Stay: Payer: No Typology Code available for payment source

## 2022-05-21 ENCOUNTER — Inpatient Hospital Stay: Payer: No Typology Code available for payment source | Attending: Oncology | Admitting: Oncology

## 2022-05-21 ENCOUNTER — Encounter: Payer: Self-pay | Admitting: Oncology

## 2022-05-21 VITALS — BP 125/74 | HR 68 | Temp 97.6°F | Resp 16 | Ht 72.0 in | Wt 215.0 lb

## 2022-05-21 DIAGNOSIS — Z87891 Personal history of nicotine dependence: Secondary | ICD-10-CM | POA: Insufficient documentation

## 2022-05-21 DIAGNOSIS — R531 Weakness: Secondary | ICD-10-CM | POA: Diagnosis not present

## 2022-05-21 DIAGNOSIS — Z8249 Family history of ischemic heart disease and other diseases of the circulatory system: Secondary | ICD-10-CM | POA: Insufficient documentation

## 2022-05-21 DIAGNOSIS — E785 Hyperlipidemia, unspecified: Secondary | ICD-10-CM | POA: Insufficient documentation

## 2022-05-21 DIAGNOSIS — G8929 Other chronic pain: Secondary | ICD-10-CM | POA: Insufficient documentation

## 2022-05-21 DIAGNOSIS — E039 Hypothyroidism, unspecified: Secondary | ICD-10-CM | POA: Diagnosis not present

## 2022-05-21 DIAGNOSIS — Z833 Family history of diabetes mellitus: Secondary | ICD-10-CM | POA: Diagnosis not present

## 2022-05-21 DIAGNOSIS — Z888 Allergy status to other drugs, medicaments and biological substances status: Secondary | ICD-10-CM | POA: Insufficient documentation

## 2022-05-21 DIAGNOSIS — D508 Other iron deficiency anemias: Secondary | ICD-10-CM

## 2022-05-21 DIAGNOSIS — Z801 Family history of malignant neoplasm of trachea, bronchus and lung: Secondary | ICD-10-CM | POA: Diagnosis not present

## 2022-05-21 DIAGNOSIS — I700 Atherosclerosis of aorta: Secondary | ICD-10-CM | POA: Diagnosis not present

## 2022-05-21 DIAGNOSIS — I252 Old myocardial infarction: Secondary | ICD-10-CM | POA: Diagnosis not present

## 2022-05-21 DIAGNOSIS — Z800 Family history of malignant neoplasm of digestive organs: Secondary | ICD-10-CM | POA: Diagnosis not present

## 2022-05-21 DIAGNOSIS — Z8719 Personal history of other diseases of the digestive system: Secondary | ICD-10-CM | POA: Insufficient documentation

## 2022-05-21 DIAGNOSIS — Z8049 Family history of malignant neoplasm of other genital organs: Secondary | ICD-10-CM | POA: Insufficient documentation

## 2022-05-21 DIAGNOSIS — R5383 Other fatigue: Secondary | ICD-10-CM | POA: Insufficient documentation

## 2022-05-21 DIAGNOSIS — Z79899 Other long term (current) drug therapy: Secondary | ICD-10-CM | POA: Insufficient documentation

## 2022-05-21 DIAGNOSIS — D509 Iron deficiency anemia, unspecified: Secondary | ICD-10-CM | POA: Insufficient documentation

## 2022-05-21 DIAGNOSIS — Z9049 Acquired absence of other specified parts of digestive tract: Secondary | ICD-10-CM | POA: Insufficient documentation

## 2022-05-21 DIAGNOSIS — Z880 Allergy status to penicillin: Secondary | ICD-10-CM | POA: Diagnosis not present

## 2022-05-21 DIAGNOSIS — Z825 Family history of asthma and other chronic lower respiratory diseases: Secondary | ICD-10-CM | POA: Diagnosis not present

## 2022-05-21 LAB — CBC WITH DIFFERENTIAL/PLATELET
Abs Immature Granulocytes: 0.01 10*3/uL (ref 0.00–0.07)
Basophils Absolute: 0.1 10*3/uL (ref 0.0–0.1)
Basophils Relative: 2 %
Eosinophils Absolute: 0.1 10*3/uL (ref 0.0–0.5)
Eosinophils Relative: 2 %
HCT: 30.4 % — ABNORMAL LOW (ref 39.0–52.0)
Hemoglobin: 8.6 g/dL — ABNORMAL LOW (ref 13.0–17.0)
Immature Granulocytes: 0 %
Lymphocytes Relative: 22 %
Lymphs Abs: 1 10*3/uL (ref 0.7–4.0)
MCH: 20.7 pg — ABNORMAL LOW (ref 26.0–34.0)
MCHC: 28.3 g/dL — ABNORMAL LOW (ref 30.0–36.0)
MCV: 73.1 fL — ABNORMAL LOW (ref 80.0–100.0)
Monocytes Absolute: 0.5 10*3/uL (ref 0.1–1.0)
Monocytes Relative: 10 %
Neutro Abs: 2.8 10*3/uL (ref 1.7–7.7)
Neutrophils Relative %: 64 %
Platelets: 341 10*3/uL (ref 150–400)
RBC: 4.16 MIL/uL — ABNORMAL LOW (ref 4.22–5.81)
RDW: 19.8 % — ABNORMAL HIGH (ref 11.5–15.5)
WBC: 4.4 10*3/uL (ref 4.0–10.5)
nRBC: 0 % (ref 0.0–0.2)

## 2022-05-21 LAB — SAMPLE TO BLOOD BANK

## 2022-05-21 NOTE — Progress Notes (Signed)
[place]  [phone] Fax:(336) [phone]  ID: Randy Mclean OB: [birth year]  MR#: [id number]  [id number]  Patient Care Team: Jonetta Osgood, NP as PCP - General (Nurse Practitioner) Lavera Guise, MD (Internal Medicine) Lloyd Huger, MD as Consulting Physician (Oncology)  CHIEF COMPLAINT: Iron deficiency anemia.  INTERVAL HISTORY: Patient returns to clinic today as an add-on after being seen in the emergency room and receiving 1 unit of blood for significant anemia.  He feels improved, but not back to his baseline.  He continues to have weakness and fatigue.  He has no neurologic complaints.  He denies any recent fevers or illnesses.  He has no chest pain, shortness of breath, cough, or hemoptysis.  He denies any nausea, vomiting, constipation, or diarrhea.  He has no melena or hematochezia.  He has no urinary complaints.  Patient offers no further specific complaints today.  REVIEW OF SYSTEMS:   Review of Systems  Constitutional:  Positive for malaise/fatigue. Negative for fever and weight loss.  Respiratory: Negative.  Negative for cough and shortness of breath.   Cardiovascular: Negative.  Negative for chest pain and leg swelling.  Gastrointestinal: Negative.  Negative for abdominal pain, blood in stool and melena.  Genitourinary: Negative.  Negative for hematuria.  Musculoskeletal: Negative.  Negative for back pain and neck pain.  Skin: Negative.  Negative for rash.  Neurological:  Positive for weakness. Negative for dizziness, sensory change, focal weakness and headaches.  Psychiatric/Behavioral: Negative.  The patient is not nervous/anxious.     As per HPI. Otherwise, a complete review of systems is negative.  PAST MEDICAL HISTORY: Past Medical History:  Diagnosis Date   Acute appendicitis 12/04/2017   Allergy    takes allergy shots   Anemia    Arthritis    Asthma    Barrett esophagus    Bronchitis    Chronic pain     Colon polyp    Depression    GERD (gastroesophageal reflux disease)    Hemorrhoid    Hyperlipidemia    Hypertension    Hypothyroid    IBS (irritable bowel syndrome)    Kidney stone    MI (myocardial infarction) ([place])    between 2002 and 2006   Migraine    Pneumonia    Seizures ([place])    Sinus problem    Spinal cord stimulator status     PAST SURGICAL HISTORY: Past Surgical History:  Procedure Laterality Date   ANTERIOR CERVICAL DISCECTOMY     APPENDECTOMY     BACK SURGERY     BACK SURGERY  12/04/2020   COLONOSCOPY  09/2013   COLONOSCOPY N/A 05/09/2022   Procedure: COLONOSCOPY;  Surgeon: [name], DO;  Location: [place];  Service: Gastroenterology;  Laterality: N/A;   elbow surgery     ESOPHAGOGASTRODUODENOSCOPY (EGD) WITH PROPOFOL N/A 05/09/2022   Procedure: ESOPHAGOGASTRODUODENOSCOPY (EGD) WITH PROPOFOL;  Surgeon: [name], DO;  Location: [place];  Service: Gastroenterology;  Laterality: N/A;   GIVENS CAPSULE STUDY N/A 05/09/2022   Procedure: GIVENS CAPSULE STUDY;  Surgeon: [name], DO;  Location: [facility] ENDOSCOPY;  Service: Gastroenterology;  Laterality: N/A;   HEMORRHOID SURGERY     LAPAROSCOPIC APPENDECTOMY N/A 12/04/2017   Procedure: APPENDECTOMY LAPAROSCOPIC;  Surgeon: Vickie Epley, MD;  Location: [facility] ORS;  Service: General;  Laterality: N/A;   LIPOMA EXCISION Right    leg   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     SPINAL CORD STIMULATOR  IMPLANT     UPPER GI ENDOSCOPY  09/2013   WRIST SURGERY Left 01/14/2021    FAMILY HISTORY Family History  Problem Relation Age of Onset   Hypertension Mother    Lung disease Mother    Heart attack Father    Diabetes Father    Liver cancer Sister    Lung cancer Sister    Colon cancer Sister    Uterine cancer Sister        ADVANCED DIRECTIVES:    HEALTH MAINTENANCE: Social History   Tobacco Use   Smoking status: Former    Packs/day: 1.00    Years:  30.00    Additional pack years: 0.00    Total pack years: 30.00    Types: Cigarettes    Quit date: 02/22/2005    Years since quitting: 17.2   Smokeless tobacco: Never  Vaping Use   Vaping Use: Never used  Substance Use Topics   Alcohol use: Yes    Alcohol/week: 0.0 standard drinks of alcohol    Comment: occasionally   Drug use: No    Comment: past     Colonoscopy:  PAP:  Bone density:  Lipid panel:  Allergies  Allergen Reactions   Amoxicillin Anaphylaxis    REACTION: unspecified   Penicillin G Anaphylaxis   Gabapentin Other (See Comments)    REACTION: anaphylaxis   Penicillins     REACTION: unspecified    Current Outpatient Medications  Medication Sig Dispense Refill   alfuzosin (UROXATRAL) 10 MG 24 hr tablet TAKE 1 TABLET(10 MG) BY MOUTH DAILY WITH BREAKFAST (Patient taking differently: Take 10 mg by mouth at bedtime.) 90 tablet 1   ALPRAZolam (XANAX) 0.5 MG tablet Take 1 tablet (0.5 mg total) by mouth 3 (three) times daily as needed for anxiety. 90 tablet 2   amLODipine (NORVASC) 5 MG tablet Take 2 tablets (10 mg total) by mouth daily. 90 tablet 1   atorvastatin (LIPITOR) 10 MG tablet TAKE 1 TABLET BY MOUTH DAILY (Patient taking differently: Take 10 mg by mouth daily.) 90 tablet 1   buPROPion (WELLBUTRIN XL) 300 MG 24 hr tablet TAKE 1 TABLET BY MOUTH DAILY, GENERIC EQUIVALENT FOR WELLBUTRIN XL. DUE FOR OFFICE VISIT. (Patient taking differently: Take 300 mg by mouth every morning. TAKE 1 TABLET BY MOUTH DAILY, GENERIC EQUIVALENT FOR WELLBUTRIN XL. DUE FOR OFFICE VISIT.) 90 tablet 1   citalopram (CELEXA) 20 MG tablet Take 1 tablet (20 mg total) by mouth daily. 90 tablet 1   esomeprazole (NEXIUM) 40 MG capsule Take 40 mg by mouth 2 (two) times daily before a meal.  0   ferrous sulfate 325 (65 FE) MG EC tablet Take 1 tablet (325 mg total) by mouth every other day. 45 tablet 1   hydrochlorothiazide (HYDRODIURIL) 12.5 MG tablet Take 1 tablet (12.5 mg total) by mouth daily. 90  tablet 1   levothyroxine (SYNTHROID) 150 MCG tablet TAKE 1 TABLET BY MOUTH DAILY BEFORE BREAKFAST. 90 tablet 1   methocarbamol (ROBAXIN) 750 MG tablet Take 750 mg by mouth 4 (four) times daily as needed.     modafinil (PROVIGIL) 200 MG tablet Take 1 tablet (200 mg total) by mouth daily. 30 tablet 2   mometasone-formoterol (DULERA) 100-5 MCG/ACT AERO INHALE 2 PUFFS BY MOUTH INTO THE LUNGS EVERY 12 HOURS 39 g 3   Multiple Vitamin (MULTI-VITAMINS) TABS Take 1 tablet by mouth daily.     pregabalin (LYRICA) 100 MG capsule Take 100 mg by mouth every 8 (eight) hours.  SYRINGE-NEEDLE, DISP, 3 ML 22G X 1" 3 ML MISC Use as directed.     tadalafil (CIALIS) 20 MG tablet Take 0.5-1 tablets (10-20 mg total) by mouth daily as needed for erectile dysfunction. 10 tablet 5   testosterone cypionate (DEPOTESTOSTERONE CYPIONATE) 200 MG/ML injection INJECT 0.75ML ONCE A WEEK AS DIRECTED 10 mL 1   tiZANidine (ZANAFLEX) 4 MG tablet Take 4 mg by mouth every 6 (six) hours as needed for muscle spasms. Pt takes one tablet at night     No current facility-administered medications for this visit.    OBJECTIVE: Vitals:   05/21/22 0928  BP: 125/74  Pulse: 68  Resp: 16  Temp: 97.6 F (36.4 C)  SpO2: 100%       Body mass index is 29.16 kg/m.    ECOG FS:0 - Asymptomatic  General: Well-developed, well-nourished, no acute distress. Eyes: Pink conjunctiva, anicteric sclera. HEENT: Normocephalic, moist mucous membranes. Lungs: No audible wheezing or coughing. Heart: Regular rate and rhythm. Abdomen: Soft, nontender, no obvious distention. Musculoskeletal: No edema, cyanosis, or clubbing. Neuro: Alert, answering all questions appropriately. Cranial nerves grossly intact. Skin: No rashes or petechiae noted. Psych: Normal affect.  LAB RESULTS:  Lab Results  Component Value Date   NA 137 05/09/2022   K 3.2 (L) 05/09/2022   CL 104 05/09/2022   CO2 28 05/09/2022   GLUCOSE 96 05/09/2022   BUN 8 05/09/2022    CREATININE 1.19 05/09/2022   CALCIUM 7.9 (L) 05/09/2022   PROT 7.1 05/07/2022   ALBUMIN 4.2 05/07/2022   AST 21 05/07/2022   ALT 17 05/07/2022   ALKPHOS 63 05/07/2022   BILITOT 0.5 05/07/2022   GFRNONAA >60 05/09/2022   GFRAA >60 05/05/2019    Lab Results  Component Value Date   WBC 4.4 05/21/2022   NEUTROABS 2.8 05/21/2022   HGB 8.6 (L) 05/21/2022   HCT 30.4 (L) 05/21/2022   MCV 73.1 (L) 05/21/2022   PLT 341 05/21/2022   Lab Results  Component Value Date   IRON 20 (L) 05/07/2022   TIBC 466 (H) 05/07/2022   IRONPCTSAT 4 (L) 05/07/2022   Lab Results  Component Value Date   FERRITIN 7 (L) 05/07/2022     STUDIES: ECHOCARDIOGRAM COMPLETE  Result Date: 05/08/2022    ECHOCARDIOGRAM REPORT   Patient Name:   TERALD MILETO Mclean Date of Exam: 05/08/2022 Medical Rec #:  [id number]              Height:       72.0 in Accession #:    [id number]             Weight:       218.0 lb Date of Birth:  02-Aug-1960              BSA:          2.210 m Patient Age:    62 years               BP:           132/70 mmHg Patient Gender: M                      HR:           73 bpm. Exam Location:  [facility] Procedure: 2D Echo, Color Doppler and Cardiac Doppler Indications:     Chest Pain R07.9  History:         Patient has prior history of Echocardiogram examinations.  Previous Myocardial Infarction, Asthma; Risk                  Factors:Hypertension and Hyperlipidemia.  Sonographer:     L. Thornton-Maynard Referring Phys:  [id number] Jose Persia Diagnosing Phys: Yolonda Kida MD IMPRESSIONS  1. Left ventricular ejection fraction, by estimation, is 65 to 70%. The left ventricle has normal function. The left ventricle has no regional wall motion abnormalities. The left ventricular internal cavity size was mildly dilated. There is mild concentric left ventricular hypertrophy. Left ventricular diastolic parameters were normal.  2. Right ventricular systolic function is normal. The right ventricular  size is normal.  3. Left atrial size was mildly dilated.  4. The mitral valve is normal in structure. Trivial mitral valve regurgitation.  5. The aortic valve is normal in structure. Aortic valve regurgitation is not visualized. FINDINGS  Left Ventricle: Left ventricular ejection fraction, by estimation, is 65 to 70%. The left ventricle has normal function. The left ventricle has no regional wall motion abnormalities. The left ventricular internal cavity size was mildly dilated. There is  mild concentric left ventricular hypertrophy. Left ventricular diastolic parameters were normal. Right Ventricle: The right ventricular size is normal. No increase in right ventricular wall thickness. Right ventricular systolic function is normal. Left Atrium: Left atrial size was mildly dilated. Right Atrium: Right atrial size was normal in size. Pericardium: There is no evidence of pericardial effusion. Mitral Valve: The mitral valve is normal in structure. Trivial mitral valve regurgitation. Tricuspid Valve: The tricuspid valve is normal in structure. Tricuspid valve regurgitation is trivial. Aortic Valve: The aortic valve is normal in structure. Aortic valve regurgitation is not visualized. Aortic valve mean gradient measures 8.5 mmHg. Aortic valve peak gradient measures 16.4 mmHg. Aortic valve area, by VTI measures 2.84 cm. Pulmonic Valve: The pulmonic valve was normal in structure. Pulmonic valve regurgitation is not visualized. Aorta: The ascending aorta was not well visualized. IAS/Shunts: No atrial level shunt detected by color flow Doppler.  LEFT VENTRICLE PLAX 2D LVIDd:         4.80 cm      Diastology LVIDs:         3.10 cm      LV e' medial:    10.10 cm/s LV PW:         1.40 cm      LV E/e' medial:  10.6 LV IVS:        1.40 cm      LV e' lateral:   12.60 cm/s LVOT diam:     2.00 cm      LV E/e' lateral: 8.5 LV SV:         107 LV SV Index:   48 LVOT Area:     3.14 cm  LV Volumes (MOD) LV vol d, MOD A2C: 80.9 ml LV vol  d, MOD A4C: 100.0 ml LV vol s, MOD A2C: 16.4 ml LV vol s, MOD A4C: 27.3 ml LV SV MOD A2C:     64.5 ml LV SV MOD A4C:     100.0 ml LV SV MOD BP:      68.7 ml RIGHT VENTRICLE RV Basal diam:  3.90 cm RV S prime:     13.40 cm/s TAPSE (M-mode): 2.9 cm LEFT ATRIUM             Index        RIGHT ATRIUM           Index LA diam:  4.30 cm 1.95 cm/m   RA Area:     17.90 cm LA Vol (A2C):   94.6 ml 42.80 ml/m  RA Volume:   52.20 ml  23.62 ml/m LA Vol (A4C):   98.2 ml 44.43 ml/m LA Biplane Vol: 99.1 ml 44.84 ml/m  AORTIC VALVE                     PULMONIC VALVE AV Area (Vmax):    2.81 cm      PV Vmax:          1.33 m/s AV Area (Vmean):   2.72 cm      PV Peak grad:     7.1 mmHg AV Area (VTI):     2.84 cm      PR End Diast Vel: 4.33 msec AV Vmax:           202.50 cm/s AV Vmean:          131.500 cm/s AV VTI:            0.376 m AV Peak Grad:      16.4 mmHg AV Mean Grad:      8.5 mmHg LVOT Vmax:         181.00 cm/s LVOT Vmean:        114.000 cm/s LVOT VTI:          0.340 m LVOT/AV VTI ratio: 0.91  AORTA Ao Root diam: 3.50 cm Ao Asc diam:  2.90 cm MITRAL VALVE MV Area (PHT): 3.91 cm     SHUNTS MV Decel Time: 194 msec     Systemic VTI:  0.34 m MV E velocity: 107.00 cm/s  Systemic Diam: 2.00 cm MV A velocity: 95.40 cm/s MV E/A ratio:  1.12 Dwayne Prince Rome MD Electronically signed by Yolonda Kida MD Signature Date/Time: 05/08/2022/2:48:36 PM    Final    DG Chest Portable 1 View  Result Date: 05/07/2022 CLINICAL DATA:  Provided history: Shortness of breath. EXAM: PORTABLE CHEST 1 VIEW COMPARISON:  Prior chest radiographs 06/19/2021 and earlier. FINDINGS: Heart size within normal limits. Aortic atherosclerosis. No appreciable airspace consolidation. No evidence of pleural effusion or pneumothorax. No acute osseous abnormality identified. Degenerative changes of the spine. Spinal stimulator leads. Partially imaged ACDF hardware. IMPRESSION: No evidence of acute cardiopulmonary abnormality. Aortic Atherosclerosis  (ICD10-I70.0). Electronically Signed   By: Kellie Simmering D.O.   On: 05/07/2022 16:19    ASSESSMENT: Iron deficiency anemia.  PLAN:    Iron deficiency anemia: Patient's hemoglobin improved with transfusion in the ED, but remains reduced along with his iron stores.  He is also symptomatic.  Return to clinic 4 times over the next 2 weeks to receive 200 mg IV Venofer.  Patient has been instructed to keep his previously scheduled follow-up appointment in May 2024 for further evaluation and continuation of treatment if needed.  Family history of colon cancer:  Patient gets routine colonoscopies given his family history of colon cancer, all of which been unrevealing.  Unclear when his last colonoscopy was completed.  His genetic testing is negative.   I spent a total of 30 minutes reviewing chart data, face-to-face evaluation with the patient, counseling and coordination of care as detailed above.   Patient expressed understanding and was in agreement with this plan. He also understands that He can call clinic at any time with any questions, concerns, or complaints.    Lloyd Huger, MD   05/21/2022 2:25 PM

## 2022-05-21 NOTE — Progress Notes (Signed)
Was recently seen in the ED with low Hgb 7.6 and low potassium. Was having chest discomfort as well. He states the chest discomfort eased up some but he still has it. Would like to reschedule infusion to next week.

## 2022-05-24 ENCOUNTER — Ambulatory Visit: Payer: No Typology Code available for payment source

## 2022-05-24 NOTE — Telephone Encounter (Signed)
Please review

## 2022-05-28 ENCOUNTER — Inpatient Hospital Stay: Payer: No Typology Code available for payment source | Attending: Oncology

## 2022-05-28 VITALS — BP 132/71 | HR 75 | Temp 97.0°F | Resp 17

## 2022-05-28 DIAGNOSIS — Z79899 Other long term (current) drug therapy: Secondary | ICD-10-CM | POA: Diagnosis not present

## 2022-05-28 DIAGNOSIS — D509 Iron deficiency anemia, unspecified: Secondary | ICD-10-CM | POA: Diagnosis present

## 2022-05-28 DIAGNOSIS — D508 Other iron deficiency anemias: Secondary | ICD-10-CM

## 2022-05-28 MED ORDER — SODIUM CHLORIDE 0.9 % IV SOLN
INTRAVENOUS | Status: DC
  Filled 2022-05-28: qty 250

## 2022-05-28 MED ORDER — SODIUM CHLORIDE 0.9 % IV SOLN
200.0000 mg | Freq: Once | INTRAVENOUS | Status: AC
  Administered 2022-05-28: 200 mg via INTRAVENOUS
  Filled 2022-05-28: qty 200

## 2022-05-30 IMAGING — CR DG CHEST 2V
1 series · 2 of 2 positions shown · non-contrast
Comparison: 05/05/2019 and prior radiographs

CLINICAL DATA: Cough for 3 months.

EXAM:
CHEST - 2 VIEW

[Series 1: dg chest 2 view · 0.14mm/px · 2 of 2 slices shown]
[im 1/2]
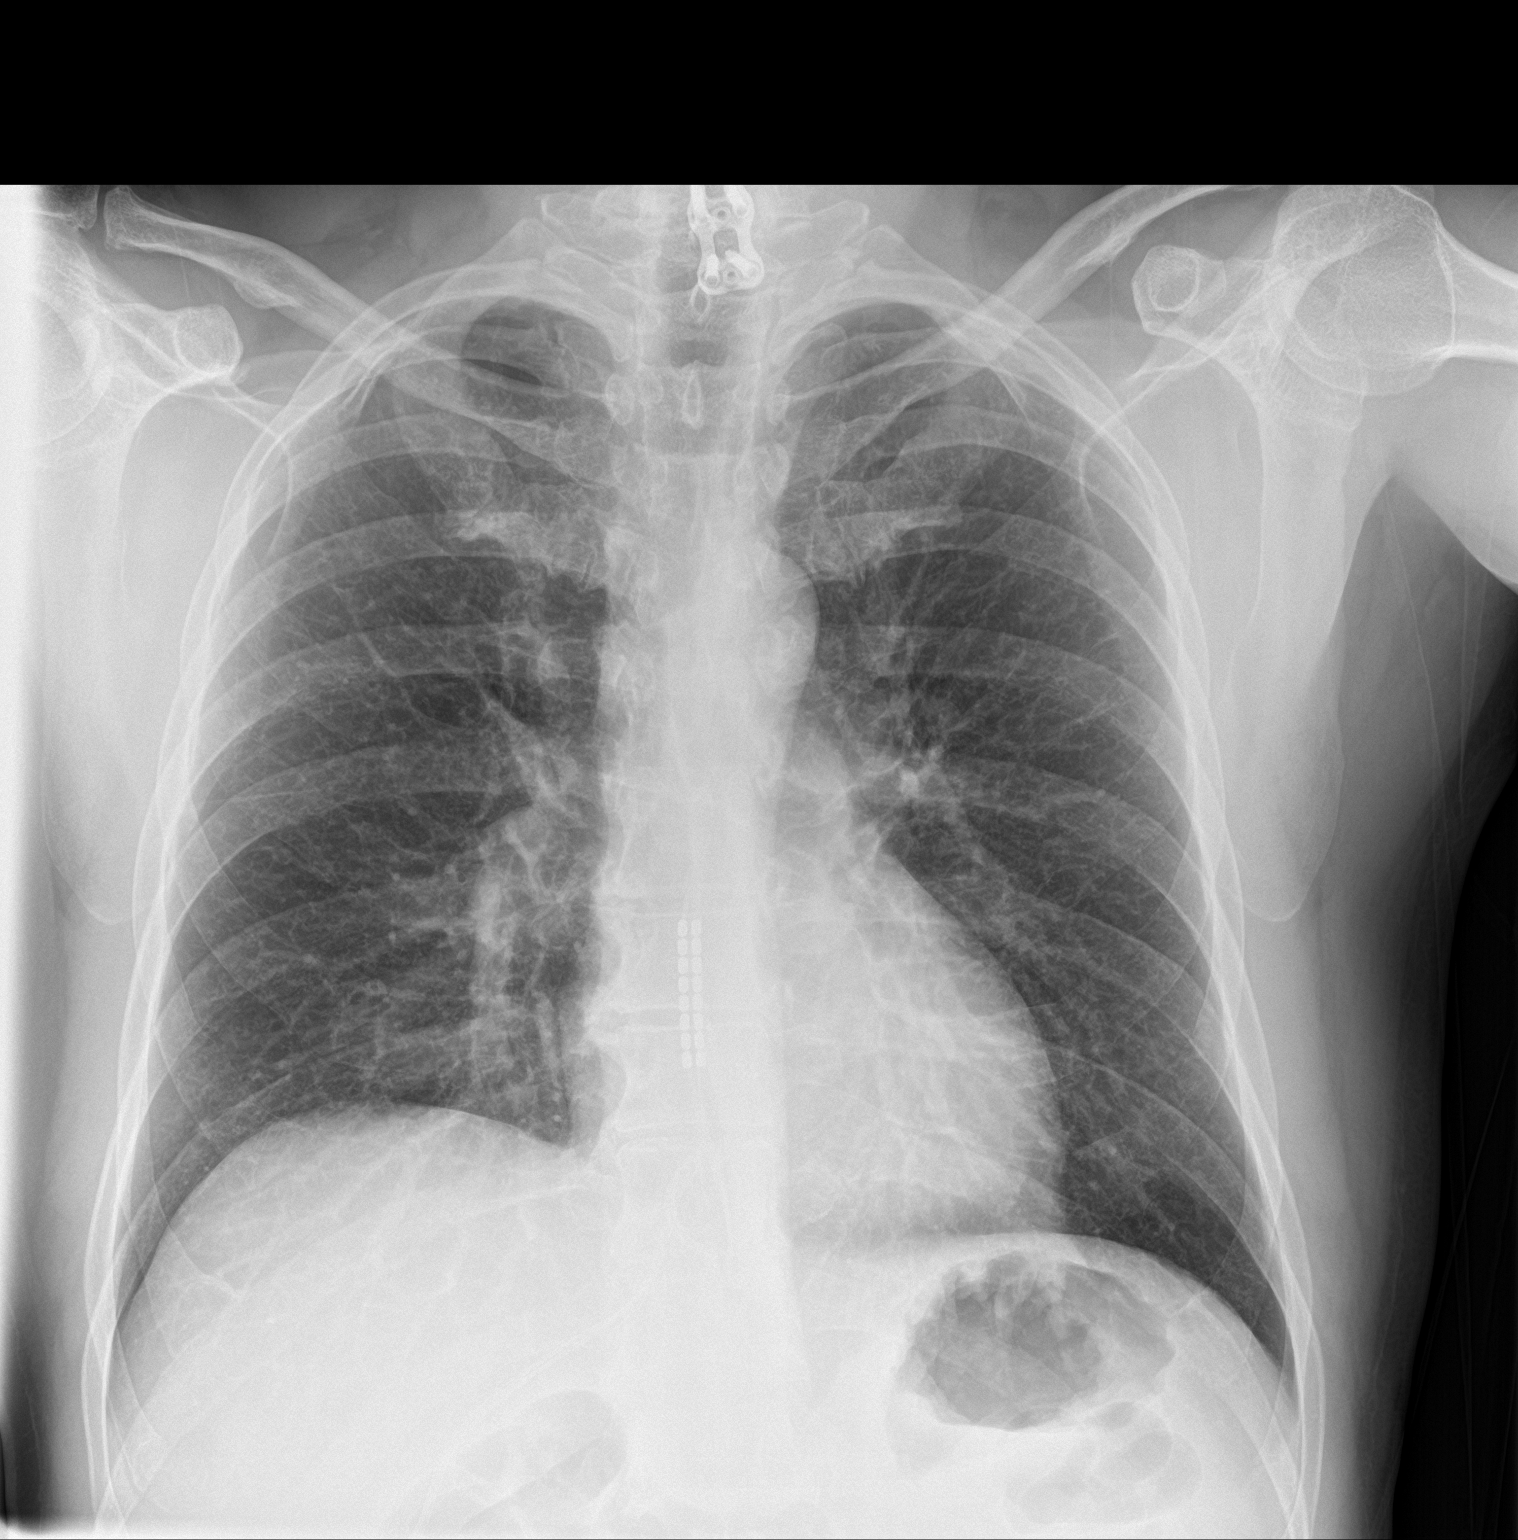
[im 2/2]
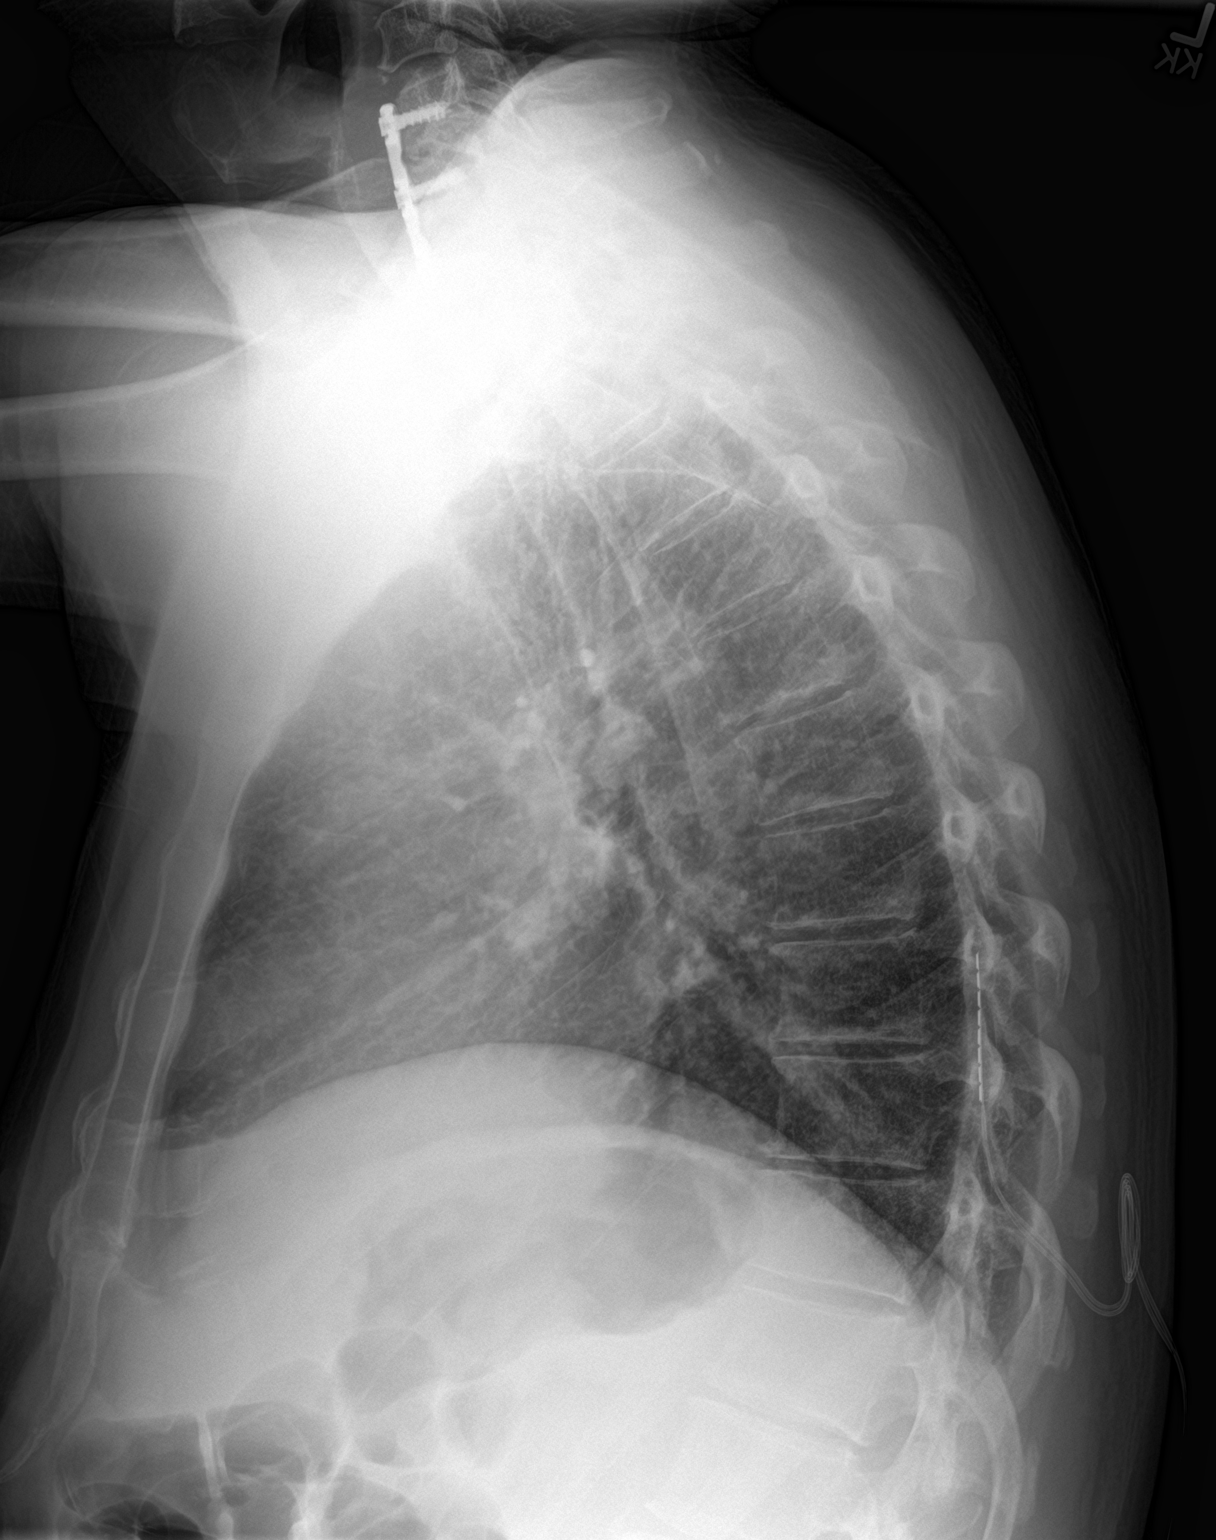

[2 of 2 positions shown; findings below may reference images not displayed]

FINDINGS: The cardiomediastinal silhouette is unremarkable.

Mild peribronchial thickening is unchanged.

There is no evidence of focal airspace disease, pulmonary edema,
suspicious pulmonary nodule/mass, pleural effusion, or pneumothorax.

No acute bony abnormalities are identified.

Cervical spine fusion hardware again noted.
IMPRESSION: No active cardiopulmonary disease.

Mild chronic peribronchial thickening.

## 2022-06-02 ENCOUNTER — Inpatient Hospital Stay: Payer: No Typology Code available for payment source

## 2022-06-02 VITALS — BP 132/67 | HR 72 | Temp 97.5°F | Resp 16

## 2022-06-02 DIAGNOSIS — D508 Other iron deficiency anemias: Secondary | ICD-10-CM

## 2022-06-02 DIAGNOSIS — D509 Iron deficiency anemia, unspecified: Secondary | ICD-10-CM | POA: Diagnosis not present

## 2022-06-02 MED ORDER — SODIUM CHLORIDE 0.9 % IV SOLN
200.0000 mg | Freq: Once | INTRAVENOUS | Status: AC
  Administered 2022-06-02: 200 mg via INTRAVENOUS
  Filled 2022-06-02: qty 200

## 2022-06-02 MED ORDER — SODIUM CHLORIDE 0.9 % IV SOLN
Freq: Once | INTRAVENOUS | Status: AC
  Filled 2022-06-02: qty 250

## 2022-06-03 MED FILL — Iron Sucrose Inj 20 MG/ML (Fe Equiv): INTRAVENOUS | Qty: 10 | Status: AC

## 2022-06-04 ENCOUNTER — Inpatient Hospital Stay: Payer: No Typology Code available for payment source

## 2022-06-04 VITALS — BP 143/69 | HR 71 | Temp 98.1°F | Resp 16

## 2022-06-04 DIAGNOSIS — D509 Iron deficiency anemia, unspecified: Secondary | ICD-10-CM | POA: Diagnosis not present

## 2022-06-04 DIAGNOSIS — D508 Other iron deficiency anemias: Secondary | ICD-10-CM

## 2022-06-04 MED ORDER — SODIUM CHLORIDE 0.9 % IV SOLN
INTRAVENOUS | Status: DC
  Filled 2022-06-04: qty 250

## 2022-06-04 MED ORDER — SODIUM CHLORIDE 0.9 % IV SOLN
200.0000 mg | Freq: Once | INTRAVENOUS | Status: AC
  Administered 2022-06-04: 200 mg via INTRAVENOUS
  Filled 2022-06-04: qty 200

## 2022-06-04 NOTE — Patient Instructions (Signed)

## 2022-06-04 NOTE — Progress Notes (Signed)
Pt tolerated treatment without complaints.  VSS.  Pt refused 30 minute post observation.  

## 2022-06-09 ENCOUNTER — Inpatient Hospital Stay: Payer: No Typology Code available for payment source

## 2022-06-09 VITALS — BP 142/79 | HR 75 | Temp 98.8°F | Resp 16

## 2022-06-09 DIAGNOSIS — D508 Other iron deficiency anemias: Secondary | ICD-10-CM

## 2022-06-09 DIAGNOSIS — D509 Iron deficiency anemia, unspecified: Secondary | ICD-10-CM | POA: Diagnosis not present

## 2022-06-09 MED ORDER — SODIUM CHLORIDE 0.9 % IV SOLN
INTRAVENOUS | Status: DC
  Filled 2022-06-09: qty 250

## 2022-06-09 MED ORDER — SODIUM CHLORIDE 0.9 % IV SOLN
200.0000 mg | Freq: Once | INTRAVENOUS | Status: AC
  Administered 2022-06-09: 200 mg via INTRAVENOUS
  Filled 2022-06-09: qty 200

## 2022-06-09 NOTE — Progress Notes (Signed)
Patient tolerated Venofer today with out any complications. Patient refused to stay for 20 minute observation post Venofer. Informed patient to go to ED if signs of an allergic reaction occur. Patient verbalized understanding. Vital signs stable at discharge.

## 2022-06-11 ENCOUNTER — Inpatient Hospital Stay: Payer: No Typology Code available for payment source

## 2022-07-01 ENCOUNTER — Encounter: Payer: Self-pay | Admitting: Nurse Practitioner

## 2022-07-01 ENCOUNTER — Ambulatory Visit (INDEPENDENT_AMBULATORY_CARE_PROVIDER_SITE_OTHER): Payer: 59 | Admitting: Nurse Practitioner

## 2022-07-01 ENCOUNTER — Encounter: Payer: Self-pay | Admitting: Oncology

## 2022-07-01 VITALS — BP 152/75 | HR 85 | Temp 98.2°F | Resp 16 | Ht 72.0 in | Wt 215.4 lb

## 2022-07-01 DIAGNOSIS — D649 Anemia, unspecified: Secondary | ICD-10-CM

## 2022-07-01 DIAGNOSIS — E782 Mixed hyperlipidemia: Secondary | ICD-10-CM | POA: Diagnosis not present

## 2022-07-01 DIAGNOSIS — N401 Enlarged prostate with lower urinary tract symptoms: Secondary | ICD-10-CM

## 2022-07-01 DIAGNOSIS — Z760 Encounter for issue of repeat prescription: Secondary | ICD-10-CM

## 2022-07-01 DIAGNOSIS — I10 Essential (primary) hypertension: Secondary | ICD-10-CM

## 2022-07-01 DIAGNOSIS — Z79899 Other long term (current) drug therapy: Secondary | ICD-10-CM

## 2022-07-01 MED ORDER — DULERA 100-5 MCG/ACT IN AERO: INHALATION_SPRAY | RESPIRATORY_TRACT | 3 refills | Status: DC

## 2022-07-01 MED ORDER — CITALOPRAM HYDROBROMIDE 40 MG PO TABS
40.0000 mg | ORAL_TABLET | Freq: Every day | ORAL | 2 refills | Status: DC
Start: 2022-07-01 — End: 2022-10-19

## 2022-07-01 MED ORDER — ATORVASTATIN CALCIUM 10 MG PO TABS: 10.0000 mg | ORAL_TABLET | Freq: Every day | ORAL | 1 refills | Status: DC

## 2022-07-01 MED ORDER — ALFUZOSIN HCL ER 10 MG PO TB24: 10.0000 mg | ORAL_TABLET | Freq: Every day | ORAL | 1 refills | Status: DC

## 2022-07-01 MED ORDER — ESOMEPRAZOLE MAGNESIUM 40 MG PO CPDR: 40.0000 mg | DELAYED_RELEASE_CAPSULE | Freq: Two times a day (BID) | ORAL | 3 refills | Status: DC

## 2022-07-01 MED ORDER — ALPRAZOLAM 0.5 MG PO TABS
0.5000 mg | ORAL_TABLET | Freq: Three times a day (TID) | ORAL | 2 refills | Status: DC | PRN
Start: 2022-07-01 — End: 2023-01-13

## 2022-07-01 MED ORDER — AMLODIPINE BESYLATE 5 MG PO TABS
10.0000 mg | ORAL_TABLET | Freq: Every day | ORAL | 1 refills | Status: DC
Start: 2022-07-01 — End: 2022-07-01

## 2022-07-01 MED ORDER — BUPROPION HCL ER (XL) 300 MG PO TB24
ORAL_TABLET | ORAL | 1 refills | Status: DC
Start: 2022-07-01 — End: 2022-10-18

## 2022-07-01 MED ORDER — AMLODIPINE BESYLATE 10 MG PO TABS
10.0000 mg | ORAL_TABLET | Freq: Every day | ORAL | 1 refills | Status: DC
Start: 2022-07-01 — End: 2022-10-18

## 2022-07-01 MED ORDER — CITALOPRAM HYDROBROMIDE 20 MG PO TABS
20.0000 mg | ORAL_TABLET | Freq: Every day | ORAL | 1 refills | Status: DC
Start: 2022-07-01 — End: 2022-07-01

## 2022-07-01 MED ORDER — MODAFINIL 200 MG PO TABS
200.0000 mg | ORAL_TABLET | Freq: Every day | ORAL | 2 refills | Status: DC
Start: 2022-07-01 — End: 2022-09-27

## 2022-07-01 NOTE — Progress Notes (Signed)
[facility] [address] [place], [place] [address]  Internal MEDICINE  Office Visit Note  Patient Name: Randy Mclean  [id number]  [id number]  Date of Service: 07/01/2022  Chief Complaint  Patient presents with   Depression   Gastroesophageal Reflux   Hyperlipidemia   Hypertension   Follow-up    HPI Randy Mclean presents for a follow-up visit for narcolepsy, hypertension, anemia.  Recent hospital stay in march for anemia and hypokalemia. Had EGD, colonoscopy and pill cam -- had a GI bleed. This issue and the anemia is recurrent, will continue to monitor. He is also seeing hematology.  Depressed and anxious -- still processing and dealing with his emotions related to his sister's death due to cancer.  Due for multiple medication refills and has switched his pharmacy Continue atorvastatin for high cholesterol.  Has hypertension, takes amlodipine and HCTZ, BP is slightly elevated today.    Current Medication: Outpatient Encounter Medications as of 07/01/2022  Medication Sig   amLODipine (NORVASC) 10 MG tablet Take 1 tablet (10 mg total) by mouth daily.   citalopram (CELEXA) 40 MG tablet Take 1 tablet (40 mg total) by mouth daily.   ferrous sulfate 325 (65 FE) MG EC tablet Take 1 tablet (325 mg total) by mouth every other day.   hydrochlorothiazide (HYDRODIURIL) 12.5 MG tablet Take 1 tablet (12.5 mg total) by mouth daily.   levothyroxine (SYNTHROID) 150 MCG tablet TAKE 1 TABLET BY MOUTH DAILY BEFORE BREAKFAST.   methocarbamol (ROBAXIN) 750 MG tablet Take 750 mg by mouth 4 (four) times daily as needed.   Multiple Vitamin (MULTI-VITAMINS) TABS Take 1 tablet by mouth daily.   pregabalin (LYRICA) 100 MG capsule Take 100 mg by mouth every 8 (eight) hours.   SYRINGE-NEEDLE, DISP, 3 ML 22G X 1" 3 ML MISC Use as directed.   tadalafil (CIALIS) 20 MG tablet Take 0.5-1 tablets (10-20 mg total) by mouth daily as needed for erectile dysfunction.   testosterone cypionate  (DEPOTESTOSTERONE CYPIONATE) 200 MG/ML injection INJECT 0.75ML ONCE A WEEK AS DIRECTED   tiZANidine (ZANAFLEX) 4 MG tablet Take 4 mg by mouth every 6 (six) hours as needed for muscle spasms. Pt takes one tablet at night   [DISCONTINUED] alfuzosin (UROXATRAL) 10 MG 24 hr tablet Take 1 tablet (10 mg total) by mouth at bedtime.   [DISCONTINUED] ALPRAZolam (XANAX) 0.5 MG tablet Take 1 tablet (0.5 mg total) by mouth 3 (three) times daily as needed for anxiety.   [DISCONTINUED] amLODipine (NORVASC) 5 MG tablet Take 2 tablets (10 mg total) by mouth daily.   [DISCONTINUED] atorvastatin (LIPITOR) 10 MG tablet TAKE 1 TABLET BY MOUTH DAILY (Patient taking differently: Take 10 mg by mouth daily.)   [DISCONTINUED] buPROPion (WELLBUTRIN XL) 300 MG 24 hr tablet TAKE 1 TABLET BY MOUTH DAILY, GENERIC EQUIVALENT FOR WELLBUTRIN XL. DUE FOR OFFICE VISIT. (Patient taking differently: Take 300 mg by mouth every morning. TAKE 1 TABLET BY MOUTH DAILY, GENERIC EQUIVALENT FOR WELLBUTRIN XL. DUE FOR OFFICE VISIT.)   [DISCONTINUED] citalopram (CELEXA) 20 MG tablet Take 1 tablet (20 mg total) by mouth daily.   [DISCONTINUED] esomeprazole (NEXIUM) 40 MG capsule Take 40 mg by mouth 2 (two) times daily before a meal.   [DISCONTINUED] modafinil (PROVIGIL) 200 MG tablet Take 1 tablet (200 mg total) by mouth daily.   [DISCONTINUED] mometasone-formoterol (DULERA) 100-5 MCG/ACT AERO INHALE 2 PUFFS BY MOUTH INTO THE LUNGS EVERY 12 HOURS   alfuzosin (UROXATRAL) 10 MG 24 hr tablet Take 1 tablet (10  mg total) by mouth at bedtime.   ALPRAZolam (XANAX) 0.5 MG tablet Take 1 tablet (0.5 mg total) by mouth 3 (three) times daily as needed for anxiety.   atorvastatin (LIPITOR) 10 MG tablet Take 1 tablet (10 mg total) by mouth daily.   buPROPion (WELLBUTRIN XL) 300 MG 24 hr tablet TAKE 1 TABLET BY MOUTH DAILY, GENERIC EQUIVALENT FOR WELLBUTRIN XL. DUE FOR OFFICE VISIT.   esomeprazole (NEXIUM) 40 MG capsule Take 1 capsule (40 mg total) by mouth 2  (two) times daily before a meal.   modafinil (PROVIGIL) 200 MG tablet Take 1 tablet (200 mg total) by mouth daily.   mometasone-formoterol (DULERA) 100-5 MCG/ACT AERO INHALE 2 PUFFS BY MOUTH INTO THE LUNGS EVERY 12 HOURS   [DISCONTINUED] amLODipine (NORVASC) 5 MG tablet Take 2 tablets (10 mg total) by mouth daily.   [DISCONTINUED] citalopram (CELEXA) 20 MG tablet Take 1 tablet (20 mg total) by mouth daily.   No facility-administered encounter medications on file as of 07/01/2022.    Surgical History: Past Surgical History:  Procedure Laterality Date   ANTERIOR CERVICAL DISCECTOMY     APPENDECTOMY     BACK SURGERY     BACK SURGERY  12/04/2020   COLONOSCOPY  09/2013   COLONOSCOPY N/A 05/09/2022   Procedure: COLONOSCOPY;  Surgeon: Jaynie Collins, DO;  Location: [facility] ENDOSCOPY;  Service: Gastroenterology;  Laterality: N/A;   elbow surgery     ESOPHAGOGASTRODUODENOSCOPY (EGD) WITH PROPOFOL N/A 05/09/2022   Procedure: ESOPHAGOGASTRODUODENOSCOPY (EGD) WITH PROPOFOL;  Surgeon: Jaynie Collins, DO;  Location: [facility] ENDOSCOPY;  Service: Gastroenterology;  Laterality: N/A;   GIVENS CAPSULE STUDY N/A 05/09/2022   Procedure: GIVENS CAPSULE STUDY;  Surgeon: Jaynie Collins, DO;  Location: [facility] ENDOSCOPY;  Service: Gastroenterology;  Laterality: N/A;   HEMORRHOID SURGERY     LAPAROSCOPIC APPENDECTOMY N/A 12/04/2017   Procedure: APPENDECTOMY LAPAROSCOPIC;  Surgeon: Ancil Linsey, MD;  Location: [facility] ORS;  Service: General;  Laterality: N/A;   LIPOMA EXCISION Right    leg   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     SPINAL CORD STIMULATOR IMPLANT     UPPER GI ENDOSCOPY  09/2013   WRIST SURGERY Left 01/14/2021    Medical History: Past Medical History:  Diagnosis Date   Acute appendicitis 12/04/2017   Allergy    takes allergy shots   Anemia    Arthritis    Asthma    Barrett esophagus    Bronchitis    Chronic pain    Colon polyp    Depression    GERD  (gastroesophageal reflux disease)    Hemorrhoid    Hyperlipidemia    Hypertension    Hypothyroid    IBS (irritable bowel syndrome)    Kidney stone    MI (myocardial infarction) (HCC)    between 2002 and 2006   Migraine    Pneumonia    Seizures (HCC)    Sinus problem    Spinal cord stimulator status     Family History: Family History  Problem Relation Age of Onset   Hypertension Mother    Lung disease Mother    Heart attack Father    Diabetes Father    Liver cancer Sister    Lung cancer Sister    Colon cancer Sister    Uterine cancer Sister     Social History   Socioeconomic History   Marital status: Married    Spouse name: Not on file   Number of children: Not on file  Years of education: Not on file   Highest education level: Not on file  Occupational History   Not on file  Tobacco Use   Smoking status: Former    Packs/day: 1.00    Years: 30.00    Additional pack years: 0.00    Total pack years: 30.00    Types: Cigarettes    Quit date: 02/22/2005    Years since quitting: 17.3   Smokeless tobacco: Never  Vaping Use   Vaping Use: Never used  Substance and Sexual Activity   Alcohol use: Yes    Alcohol/week: 0.0 standard drinks of alcohol    Comment: occasionally   Drug use: No    Comment: past   Sexual activity: Yes  Other Topics Concern   Not on file  Social History Narrative   Not on file   Social Determinants of Health   Financial Resource Strain: Not on file  Food Insecurity: No Food Insecurity (05/07/2022)   Hunger Vital Sign    Worried About Running Out of Food in the Last Year: Never true    Ran Out of Food in the Last Year: Never true  Transportation Needs: No Transportation Needs (05/07/2022)   PRAPARE - Administrator, Civil Service (Medical): No    Lack of Transportation (Non-Medical): No  Physical Activity: Not on file  Stress: Not on file  Social Connections: Not on file  Intimate Partner Violence: Not At Risk (05/07/2022)    Humiliation, Afraid, Rape, and Kick questionnaire    Fear of Current or Ex-Partner: No    Emotionally Abused: No    Physically Abused: No    Sexually Abused: No      Review of Systems  Constitutional:  Positive for fatigue. Negative for chills and unexpected weight change.  HENT:  Negative for congestion, rhinorrhea, sneezing and sore throat.   Respiratory:  Negative for cough, chest tightness, shortness of breath and wheezing.   Cardiovascular: Negative.  Negative for chest pain and palpitations.  Gastrointestinal:  Negative for abdominal pain, constipation, diarrhea, nausea and vomiting.  Musculoskeletal:  Positive for arthralgias. Negative for back pain, joint swelling and neck pain.  Skin: Negative.   Neurological:  Negative for dizziness, tremors and numbness.  Hematological:  Negative for adenopathy. Does not bruise/bleed easily.  Psychiatric/Behavioral:  Positive for decreased concentration and sleep disturbance. Negative for behavioral problems (Depression), self-injury and suicidal ideas. The patient is nervous/anxious.     Vital Signs: BP (!) 152/75 Comment: 160/70  Pulse 85   Temp 98.2 F (36.8 C)   Resp 16   Ht 6' (1.829 m)   Wt 215 lb 6.4 oz (97.7 kg)   SpO2 96%   BMI 29.21 kg/m    Physical Exam Vitals reviewed.  Constitutional:      General: He is not in acute distress.    Appearance: Normal appearance. He is not ill-appearing.  HENT:     Head: Normocephalic and atraumatic.  Eyes:     Pupils: Pupils are equal, round, and reactive to light.  Cardiovascular:     Rate and Rhythm: Normal rate and regular rhythm.     Heart sounds: Normal heart sounds. No murmur heard. Pulmonary:     Effort: Pulmonary effort is normal. No respiratory distress.     Breath sounds: Normal breath sounds. No wheezing.  Neurological:     Mental Status: He is alert and oriented to person, place, and time.  Psychiatric:        Mood and  Affect: Mood normal.        Behavior:  Behavior normal.        Assessment/Plan: 1. Primary hypertension Amlodipine dose increased to 10 mg daily. Follow up in 1 month to reassess.  - amLODipine (NORVASC) 10 MG tablet; Take 1 tablet (10 mg total) by mouth daily.  Dispense: 90 tablet; Refill: 1  2. Symptomatic anemia Continue following up with hematology   3. Mixed hyperlipidemia Continue atorvastatin as prescribed.  - atorvastatin (LIPITOR) 10 MG tablet; Take 1 tablet (10 mg total) by mouth daily.  Dispense: 90 tablet; Refill: 1  4. Benign prostatic hyperplasia with lower urinary tract symptoms, symptom details unspecified Continue alfuzosin as prescribed  - alfuzosin (UROXATRAL) 10 MG 24 hr tablet; Take 1 tablet (10 mg total) by mouth at bedtime.  Dispense: 90 tablet; Refill: 1  5. Encounter for medication review Medication list reviewed, updated and refills ordered.  - modafinil (PROVIGIL) 200 MG tablet; Take 1 tablet (200 mg total) by mouth daily.  Dispense: 30 tablet; Refill: 2 - ALPRAZolam (XANAX) 0.5 MG tablet; Take 1 tablet (0.5 mg total) by mouth 3 (three) times daily as needed for anxiety.  Dispense: 90 tablet; Refill: 2 - buPROPion (WELLBUTRIN XL) 300 MG 24 hr tablet; TAKE 1 TABLET BY MOUTH DAILY, GENERIC EQUIVALENT FOR WELLBUTRIN XL. DUE FOR OFFICE VISIT.  Dispense: 90 tablet; Refill: 1 - esomeprazole (NEXIUM) 40 MG capsule; Take 1 capsule (40 mg total) by mouth 2 (two) times daily before a meal.  Dispense: 60 capsule; Refill: 3 - mometasone-formoterol (DULERA) 100-5 MCG/ACT AERO; INHALE 2 PUFFS BY MOUTH INTO THE LUNGS EVERY 12 HOURS  Dispense: 39 g; Refill: 3 - citalopram (CELEXA) 40 MG tablet; Take 1 tablet (40 mg total) by mouth daily.  Dispense: 30 tablet; Refill: 2   General Counseling: Randy Mclean verbalizes understanding of the findings of todays visit and agrees with plan of treatment. I have discussed any further diagnostic evaluation that may be needed or ordered today. We also reviewed his medications  today. he has been encouraged to call the office with any questions or concerns that should arise related to todays visit.    No orders of the defined types were placed in this encounter.   Meds ordered this encounter  Medications   modafinil (PROVIGIL) 200 MG tablet    Sig: Take 1 tablet (200 mg total) by mouth daily.    Dispense:  30 tablet    Refill:  2    refills   ALPRAZolam (XANAX) 0.5 MG tablet    Sig: Take 1 tablet (0.5 mg total) by mouth 3 (three) times daily as needed for anxiety.    Dispense:  90 tablet    Refill:  2   alfuzosin (UROXATRAL) 10 MG 24 hr tablet    Sig: Take 1 tablet (10 mg total) by mouth at bedtime.    Dispense:  90 tablet    Refill:  1   DISCONTD: amLODipine (NORVASC) 5 MG tablet    Sig: Take 2 tablets (10 mg total) by mouth daily.    Dispense:  90 tablet    Refill:  1   atorvastatin (LIPITOR) 10 MG tablet    Sig: Take 1 tablet (10 mg total) by mouth daily.    Dispense:  90 tablet    Refill:  1   buPROPion (WELLBUTRIN XL) 300 MG 24 hr tablet    Sig: TAKE 1 TABLET BY MOUTH DAILY, GENERIC EQUIVALENT FOR WELLBUTRIN XL. DUE FOR OFFICE VISIT.  Dispense:  90 tablet    Refill:  1   DISCONTD: citalopram (CELEXA) 20 MG tablet    Sig: Take 1 tablet (20 mg total) by mouth daily.    Dispense:  90 tablet    Refill:  1    For future refills   esomeprazole (NEXIUM) 40 MG capsule    Sig: Take 1 capsule (40 mg total) by mouth 2 (two) times daily before a meal.    Dispense:  60 capsule    Refill:  3   mometasone-formoterol (DULERA) 100-5 MCG/ACT AERO    Sig: INHALE 2 PUFFS BY MOUTH INTO THE LUNGS EVERY 12 HOURS    Dispense:  39 g    Refill:  3   citalopram (CELEXA) 40 MG tablet    Sig: Take 1 tablet (40 mg total) by mouth daily.    Dispense:  30 tablet    Refill:  2    Note increased dose, Please discontinue all previous citalopram prescriptions and fill new script today   amLODipine (NORVASC) 10 MG tablet    Sig: Take 1 tablet (10 mg total) by mouth  daily.    Dispense:  90 tablet    Refill:  1    Note increased dose, discontinue all previous prescriptions for amlodipine and fill this script today    Return in about 1 month (around 08/01/2022) for F/U med changes , Randy Mclean PCP.   Total time spent:30 Minutes Time spent includes review of chart, medications, test results, and follow up plan with the patient.   [place] Controlled Substance Database was reviewed by me.  This patient was seen by Sallyanne Kuster, FNP-C in collaboration with Dr. Beverely Risen as a part of collaborative care agreement.   Randy Mclean R. Tedd Sias, MSN, FNP-C Internal medicine

## 2022-07-08 ENCOUNTER — Telehealth: Payer: Self-pay | Admitting: Nurse Practitioner

## 2022-07-08 NOTE — Telephone Encounter (Signed)
Patient states he never heard back from [facility]. I sent referral via proficient to [facility]

## 2022-07-12 ENCOUNTER — Encounter: Payer: Self-pay | Admitting: Oncology

## 2022-07-12 ENCOUNTER — Encounter: Payer: Self-pay | Admitting: Nurse Practitioner

## 2022-07-12 ENCOUNTER — Telehealth: Payer: 59 | Admitting: Nurse Practitioner

## 2022-07-12 VITALS — Ht 72.0 in | Wt 207.0 lb

## 2022-07-12 DIAGNOSIS — J208 Acute bronchitis due to other specified organisms: Secondary | ICD-10-CM | POA: Diagnosis not present

## 2022-07-12 DIAGNOSIS — R051 Acute cough: Secondary | ICD-10-CM | POA: Diagnosis not present

## 2022-07-12 DIAGNOSIS — B9689 Other specified bacterial agents as the cause of diseases classified elsewhere: Secondary | ICD-10-CM | POA: Diagnosis not present

## 2022-07-12 MED ORDER — PREDNISONE 10 MG (21) PO TBPK
ORAL_TABLET | ORAL | 0 refills | Status: DC
Start: 2022-07-12 — End: 2022-08-06

## 2022-07-12 MED ORDER — PROMETHAZINE-DM 6.25-15 MG/5ML PO SYRP
5.0000 mL | ORAL_SOLUTION | Freq: Four times a day (QID) | ORAL | 0 refills | Status: DC | PRN
Start: 2022-07-12 — End: 2022-08-02

## 2022-07-12 MED ORDER — LEVOFLOXACIN 750 MG PO TABS
750.0000 mg | ORAL_TABLET | Freq: Every day | ORAL | 0 refills | Status: AC
Start: 2022-07-12 — End: 2022-07-19

## 2022-07-12 NOTE — Progress Notes (Signed)
[facility] [address] [place], [place] [address]  Internal MEDICINE  Telephone Visit  Patient Name: Randy Mclean Younger  [id number]  [id number]  Date of Service: 07/12/2022  I connected with the patient at 1600 by telephone and verified the patients identity using two identifiers.   I discussed the limitations, risks, security and privacy concerns of performing an evaluation and management service by telephone and the availability of in person appointments. I also discussed with the patient that there may be a patient responsible charge related to the service.  The patient expressed understanding and agrees to proceed.    Chief Complaint  Patient presents with   Telephone Assessment    Covid test negative    Telephone Screen   Sinusitis   Cough    Greenish     HPI Kenye presents for a telehealth virtual visit for symptoms of sinusitis.  Reports cough, green drainage, nasal congestion, sinus pressure, fatigue, headache, chest tightness, SOB and wheezing. Covid test was negative Symptoms started on last Friday OTC cough medication does not help Per patient, tussionex and benzonatate does not help with cough either.    Current Medication: Outpatient Encounter Medications as of 07/12/2022  Medication Sig   alfuzosin (UROXATRAL) 10 MG 24 hr tablet Take 1 tablet (10 mg total) by mouth at bedtime.   ALPRAZolam (XANAX) 0.5 MG tablet Take 1 tablet (0.5 mg total) by mouth 3 (three) times daily as needed for anxiety.   amLODipine (NORVASC) 10 MG tablet Take 1 tablet (10 mg total) by mouth daily.   atorvastatin (LIPITOR) 10 MG tablet Take 1 tablet (10 mg total) by mouth daily.   buPROPion (WELLBUTRIN XL) 300 MG 24 hr tablet TAKE 1 TABLET BY MOUTH DAILY, GENERIC EQUIVALENT FOR WELLBUTRIN XL. DUE FOR OFFICE VISIT.   citalopram (CELEXA) 40 MG tablet Take 1 tablet (40 mg total) by mouth daily.   esomeprazole (NEXIUM) 40 MG capsule Take 1 capsule (40 mg total) by mouth 2  (two) times daily before a meal.   ferrous sulfate 325 (65 FE) MG EC tablet Take 1 tablet (325 mg total) by mouth every other day.   hydrochlorothiazide (HYDRODIURIL) 12.5 MG tablet Take 1 tablet (12.5 mg total) by mouth daily.   levofloxacin (LEVAQUIN) 750 MG tablet Take 1 tablet (750 mg total) by mouth daily for 7 days. Take with food   levothyroxine (SYNTHROID) 150 MCG tablet TAKE 1 TABLET BY MOUTH DAILY BEFORE BREAKFAST.   methocarbamol (ROBAXIN) 750 MG tablet Take 750 mg by mouth 4 (four) times daily as needed.   modafinil (PROVIGIL) 200 MG tablet Take 1 tablet (200 mg total) by mouth daily.   mometasone-formoterol (DULERA) 100-5 MCG/ACT AERO INHALE 2 PUFFS BY MOUTH INTO THE LUNGS EVERY 12 HOURS   Multiple Vitamin (MULTI-VITAMINS) TABS Take 1 tablet by mouth daily.   predniSONE (STERAPRED UNI-PAK 21 TAB) 10 MG (21) TBPK tablet Use as directed for 6 days   pregabalin (LYRICA) 100 MG capsule Take 100 mg by mouth every 8 (eight) hours.   promethazine-dextromethorphan (PROMETHAZINE-DM) 6.25-15 MG/5ML syrup Take 5 mLs by mouth 4 (four) times daily as needed for cough.   SYRINGE-NEEDLE, DISP, 3 ML 22G X 1" 3 ML MISC Use as directed.   tadalafil (CIALIS) 20 MG tablet Take 0.5-1 tablets (10-20 mg total) by mouth daily as needed for erectile dysfunction.   testosterone cypionate (DEPOTESTOSTERONE CYPIONATE) 200 MG/ML injection INJECT 0.75ML ONCE A WEEK AS DIRECTED   tiZANidine (ZANAFLEX) 4 MG tablet Take  4 mg by mouth every 6 (six) hours as needed for muscle spasms. Pt takes one tablet at night   No facility-administered encounter medications on file as of 07/12/2022.    Surgical History: Past Surgical History:  Procedure Laterality Date   ANTERIOR CERVICAL DISCECTOMY     APPENDECTOMY     BACK SURGERY     BACK SURGERY  12/04/2020   COLONOSCOPY  09/2013   COLONOSCOPY N/A 05/09/2022   Procedure: COLONOSCOPY;  Surgeon: Jaynie Collins, DO;  Location: [facility] ENDOSCOPY;  Service:  Gastroenterology;  Laterality: N/A;   elbow surgery     ESOPHAGOGASTRODUODENOSCOPY (EGD) WITH PROPOFOL N/A 05/09/2022   Procedure: ESOPHAGOGASTRODUODENOSCOPY (EGD) WITH PROPOFOL;  Surgeon: Jaynie Collins, DO;  Location: [facility] ENDOSCOPY;  Service: Gastroenterology;  Laterality: N/A;   GIVENS CAPSULE STUDY N/A 05/09/2022   Procedure: GIVENS CAPSULE STUDY;  Surgeon: Jaynie Collins, DO;  Location: [facility] ENDOSCOPY;  Service: Gastroenterology;  Laterality: N/A;   HEMORRHOID SURGERY     LAPAROSCOPIC APPENDECTOMY N/A 12/04/2017   Procedure: APPENDECTOMY LAPAROSCOPIC;  Surgeon: Ancil Linsey, MD;  Location: [facility] ORS;  Service: General;  Laterality: N/A;   LIPOMA EXCISION Right    leg   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     SPINAL CORD STIMULATOR IMPLANT     UPPER GI ENDOSCOPY  09/2013   WRIST SURGERY Left 01/14/2021    Medical History: Past Medical History:  Diagnosis Date   Acute appendicitis 12/04/2017   Allergy    takes allergy shots   Anemia    Arthritis    Asthma    Barrett esophagus    Bronchitis    Chronic pain    Colon polyp    Depression    GERD (gastroesophageal reflux disease)    Hemorrhoid    Hyperlipidemia    Hypertension    Hypothyroid    IBS (irritable bowel syndrome)    Kidney stone    MI (myocardial infarction) (HCC)    between 2002 and 2006   Migraine    Pneumonia    Seizures (HCC)    Sinus problem    Spinal cord stimulator status     Family History: Family History  Problem Relation Age of Onset   Hypertension Mother    Lung disease Mother    Heart attack Father    Diabetes Father    Liver cancer Sister    Lung cancer Sister    Colon cancer Sister    Uterine cancer Sister     Social History   Socioeconomic History   Marital status: Married    Spouse name: Not on file   Number of children: Not on file   Years of education: Not on file   Highest education level: Not on file  Occupational History   Not on file  Tobacco  Use   Smoking status: Former    Packs/day: 1.00    Years: 30.00    Additional pack years: 0.00    Total pack years: 30.00    Types: Cigarettes    Quit date: 02/22/2005    Years since quitting: 17.3   Smokeless tobacco: Never  Vaping Use   Vaping Use: Never used  Substance and Sexual Activity   Alcohol use: Yes    Alcohol/week: 0.0 standard drinks of alcohol    Comment: occasionally   Drug use: No    Comment: past   Sexual activity: Yes  Other Topics Concern   Not on file  Social History Narrative  Not on file   Social Determinants of Health   Financial Resource Strain: Not on file  Food Insecurity: No Food Insecurity (05/07/2022)   Hunger Vital Sign    Worried About Running Out of Food in the Last Year: Never true    Ran Out of Food in the Last Year: Never true  Transportation Needs: No Transportation Needs (05/07/2022)   PRAPARE - Administrator, Civil Service (Medical): No    Lack of Transportation (Non-Medical): No  Physical Activity: Not on file  Stress: Not on file  Social Connections: Not on file  Intimate Partner Violence: Not At Risk (05/07/2022)   Humiliation, Afraid, Rape, and Kick questionnaire    Fear of Current or Ex-Partner: No    Emotionally Abused: No    Physically Abused: No    Sexually Abused: No      Review of Systems  Constitutional:  Positive for fatigue. Negative for chills and fever.  HENT:  Positive for congestion, postnasal drip (greenish yellow drainage), rhinorrhea, sinus pressure and sinus pain. Negative for sore throat.   Eyes:  Negative for pain.  Respiratory:  Positive for cough, chest tightness, shortness of breath and wheezing.   Cardiovascular: Negative.  Negative for chest pain and palpitations.  Gastrointestinal:  Negative for abdominal pain, constipation, diarrhea, nausea and vomiting.  Musculoskeletal:  Negative for myalgias.  Neurological:  Positive for headaches.    Vital Signs: Ht 6' (1.829 m)   Wt 207 lb  (93.9 kg)   BMI 28.07 kg/m    Observation/Objective: He is alert and oriented and engages in conversation appropriately. No acute distress noted.     Assessment/Plan: 1. Acute bacterial bronchitis Empiric antibiotic and prednisone taper prescribed.  - levofloxacin (LEVAQUIN) 750 MG tablet; Take 1 tablet (750 mg total) by mouth daily for 7 days. Take with food  Dispense: 7 tablet; Refill: 0 - predniSONE (STERAPRED UNI-PAK 21 TAB) 10 MG (21) TBPK tablet; Use as directed for 6 days  Dispense: 21 tablet; Refill: 0  2. Acute cough Medication prescribed to treat cough - promethazine-dextromethorphan (PROMETHAZINE-DM) 6.25-15 MG/5ML syrup; Take 5 mLs by mouth 4 (four) times daily as needed for cough.  Dispense: 180 mL; Refill: 0   General Counseling: Blayze verbalizes understanding of the findings of today's phone visit and agrees with plan of treatment. I have discussed any further diagnostic evaluation that may be needed or ordered today. We also reviewed his medications today. he has been encouraged to call the office with any questions or concerns that should arise related to todays visit.  Return if symptoms worsen or fail to improve.   No orders of the defined types were placed in this encounter.   Meds ordered this encounter  Medications   levofloxacin (LEVAQUIN) 750 MG tablet    Sig: Take 1 tablet (750 mg total) by mouth daily for 7 days. Take with food    Dispense:  7 tablet    Refill:  0   predniSONE (STERAPRED UNI-PAK 21 TAB) 10 MG (21) TBPK tablet    Sig: Use as directed for 6 days    Dispense:  21 tablet    Refill:  0   promethazine-dextromethorphan (PROMETHAZINE-DM) 6.25-15 MG/5ML syrup    Sig: Take 5 mLs by mouth 4 (four) times daily as needed for cough.    Dispense:  180 mL    Refill:  0    Time spent:10 Minutes Time spent with patient included reviewing progress notes, labs, imaging studies, and discussing  plan for follow up.  [place] Controlled Substance Database  was reviewed by me for overdose risk score (ORS) if appropriate.  This patient was seen by Sallyanne Kuster, FNP-C in collaboration with Dr. Beverely Risen as a part of collaborative care agreement.  Tacarra Justo R. Tedd Sias, MSN, FNP-C Internal medicine

## 2022-07-14 ENCOUNTER — Encounter: Payer: Self-pay | Admitting: Oncology

## 2022-07-19 ENCOUNTER — Encounter: Payer: Self-pay | Admitting: Nurse Practitioner

## 2022-07-21 ENCOUNTER — Encounter: Payer: Self-pay | Admitting: Oncology

## 2022-07-21 ENCOUNTER — Inpatient Hospital Stay: Payer: 59 | Attending: Oncology

## 2022-07-21 DIAGNOSIS — D509 Iron deficiency anemia, unspecified: Secondary | ICD-10-CM | POA: Insufficient documentation

## 2022-07-21 DIAGNOSIS — D508 Other iron deficiency anemias: Secondary | ICD-10-CM

## 2022-07-21 LAB — CBC WITH DIFFERENTIAL/PLATELET
Abs Immature Granulocytes: 0.03 10*3/uL (ref 0.00–0.07)
Basophils Absolute: 0.1 10*3/uL (ref 0.0–0.1)
Basophils Relative: 1 %
Eosinophils Absolute: 0.1 10*3/uL (ref 0.0–0.5)
Eosinophils Relative: 2 %
HCT: 41.1 % (ref 39.0–52.0)
Hemoglobin: 12.5 g/dL — ABNORMAL LOW (ref 13.0–17.0)
Immature Granulocytes: 0 %
Lymphocytes Relative: 19 %
Lymphs Abs: 1.5 10*3/uL (ref 0.7–4.0)
MCH: 24.2 pg — ABNORMAL LOW (ref 26.0–34.0)
MCHC: 30.4 g/dL (ref 30.0–36.0)
MCV: 79.5 fL — ABNORMAL LOW (ref 80.0–100.0)
Monocytes Absolute: 0.9 10*3/uL (ref 0.1–1.0)
Monocytes Relative: 11 %
Neutro Abs: 5.2 10*3/uL (ref 1.7–7.7)
Neutrophils Relative %: 67 %
Platelets: 324 10*3/uL (ref 150–400)
RBC: 5.17 MIL/uL (ref 4.22–5.81)
RDW: 21.2 % — ABNORMAL HIGH (ref 11.5–15.5)
WBC: 7.7 10*3/uL (ref 4.0–10.5)
nRBC: 0 % (ref 0.0–0.2)

## 2022-07-21 LAB — COMPREHENSIVE METABOLIC PANEL
ALT: 16 U/L (ref 0–44)
AST: 16 U/L (ref 15–41)
Albumin: 4.3 g/dL (ref 3.5–5.0)
Alkaline Phosphatase: 78 U/L (ref 38–126)
Anion gap: 15 (ref 5–15)
BUN: 14 mg/dL (ref 6–20)
CO2: 26 mmol/L (ref 22–32)
Calcium: 9.1 mg/dL (ref 8.9–10.3)
Chloride: 99 mmol/L (ref 98–111)
Creatinine, Ser: 1.1 mg/dL (ref 0.61–1.24)
GFR, Estimated: >60 mL/min (ref >60)
Glucose, Bld: 84 mg/dL (ref 70–99)
Potassium: 3.7 mmol/L (ref 3.5–5.1)
Sodium: 140 mmol/L (ref 135–145)
Total Bilirubin: 0.3 mg/dL (ref 0.3–1.2)
Total Protein: 7.7 g/dL (ref 6.5–8.1)

## 2022-07-21 LAB — IRON AND TIBC
Iron: 68 ug/dL (ref 45–182)
Saturation Ratios: 15 % — ABNORMAL LOW (ref 17.9–39.5)
TIBC: 452 ug/dL — ABNORMAL HIGH (ref 250–450)
UIBC: 384 ug/dL

## 2022-07-21 LAB — FERRITIN: Ferritin: 15 ng/mL — ABNORMAL LOW (ref 24–336)

## 2022-07-21 LAB — VITAMIN B12: Vitamin B-12: 314 pg/mL (ref 180–914)

## 2022-07-22 ENCOUNTER — Inpatient Hospital Stay (HOSPITAL_BASED_OUTPATIENT_CLINIC_OR_DEPARTMENT_OTHER): Payer: 59 | Admitting: Oncology

## 2022-07-22 DIAGNOSIS — D509 Iron deficiency anemia, unspecified: Secondary | ICD-10-CM | POA: Diagnosis not present

## 2022-07-22 NOTE — Progress Notes (Signed)
[facility]  [phone] [phone]) [phone]  ID: Randy Mclean OB: 08/28/1960  MR#: [id number]  AOZ#:[id number]  Patient Care Team: Sallyanne Kuster, NP as PCP - General (Nurse Practitioner) Lyndon Code, MD (Internal Medicine) Jeralyn Ruths, MD as Consulting Physician (Oncology)  I connected with Randy Mclean on 07/23/22 at  3:30 PM EDT by video enabled telemedicine visit and verified that I am speaking with the correct person using two identifiers.   I discussed the limitations, risks, security and privacy concerns of performing an evaluation and management service by telemedicine and the availability of in-person appointments. I also discussed with the patient that there may be a patient responsible charge related to this service. The patient expressed understanding and agreed to proceed.   Other persons participating in the visit and their role in the encounter: Patient, MD.  Patient's location: Home. Provider's location: Clinic.  CHIEF COMPLAINT: Iron deficiency anemia.  INTERVAL HISTORY: Patient agreed to video assisted telemedicine visit for further evaluation, discussion of his laboratory results, and consideration of additional IV Venofer if necessary.  He feels significantly improved and does not complain of weakness or fatigue today.  He has had no further GI bleeds.  He has no neurologic complaints.  He denies any recent fevers or illnesses.  He has no chest pain, shortness of breath, cough, or hemoptysis.  He denies any nausea, vomiting, constipation, or diarrhea.  He has no melena or hematochezia.  He has no urinary complaints.  Patient offers no specific complaints today.  REVIEW OF SYSTEMS:   Review of Systems  Constitutional: Negative.  Negative for fever, malaise/fatigue and weight loss.  Respiratory: Negative.  Negative for cough and shortness of breath.   Cardiovascular: Negative.  Negative for chest pain and  leg swelling.  Gastrointestinal: Negative.  Negative for abdominal pain, blood in stool and melena.  Genitourinary: Negative.  Negative for hematuria.  Musculoskeletal: Negative.  Negative for back pain and neck pain.  Skin: Negative.  Negative for rash.  Neurological: Negative.  Negative for dizziness, sensory change, focal weakness, weakness and headaches.  Psychiatric/Behavioral: Negative.  The patient is not nervous/anxious.     As per HPI. Otherwise, a complete review of systems is negative.  PAST MEDICAL HISTORY: Past Medical History:  Diagnosis Date   Acute appendicitis 12/04/2017   Allergy    takes allergy shots   Anemia    Arthritis    Asthma    Barrett esophagus    Bronchitis    Chronic pain    Colon polyp    Depression    GERD (gastroesophageal reflux disease)    Hemorrhoid    Hyperlipidemia    Hypertension    Hypothyroid    IBS (irritable bowel syndrome)    Kidney stone    MI (myocardial infarction) (HCC)    between 2002 and 2006   Migraine    Pneumonia    Seizures (HCC)    Sinus problem    Spinal cord stimulator status     PAST SURGICAL HISTORY: Past Surgical History:  Procedure Laterality Date   ANTERIOR CERVICAL DISCECTOMY     APPENDECTOMY     BACK SURGERY     BACK SURGERY  12/04/2020   COLONOSCOPY  09/2013   COLONOSCOPY N/A 05/09/2022   Procedure: COLONOSCOPY;  Surgeon: Jaynie Collins, DO;  Location: [facility] ENDOSCOPY;  Service: Gastroenterology;  Laterality: N/A;   elbow surgery     ESOPHAGOGASTRODUODENOSCOPY (EGD) WITH PROPOFOL N/A 05/09/2022  Procedure: ESOPHAGOGASTRODUODENOSCOPY (EGD) WITH PROPOFOL;  Surgeon: Jaynie Collins, DO;  Location: [facility] ENDOSCOPY;  Service: Gastroenterology;  Laterality: N/A;   GIVENS CAPSULE STUDY N/A 05/09/2022   Procedure: GIVENS CAPSULE STUDY;  Surgeon: Jaynie Collins, DO;  Location: [facility] ENDOSCOPY;  Service: Gastroenterology;  Laterality: N/A;   HEMORRHOID SURGERY     LAPAROSCOPIC APPENDECTOMY  N/A 12/04/2017   Procedure: APPENDECTOMY LAPAROSCOPIC;  Surgeon: Ancil Linsey, MD;  Location: [facility] ORS;  Service: General;  Laterality: N/A;   LIPOMA EXCISION Right    leg   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     SPINAL CORD STIMULATOR IMPLANT     UPPER GI ENDOSCOPY  09/2013   WRIST SURGERY Left 01/14/2021    FAMILY HISTORY Family History  Problem Relation Age of Onset   Hypertension Mother    Lung disease Mother    Heart attack Father    Diabetes Father    Liver cancer Sister    Lung cancer Sister    Colon cancer Sister    Uterine cancer Sister        ADVANCED DIRECTIVES:    HEALTH MAINTENANCE: Social History   Tobacco Use   Smoking status: Former    Packs/day: 1.00    Years: 30.00    Additional pack years: 0.00    Total pack years: 30.00    Types: Cigarettes    Quit date: 02/22/2005    Years since quitting: 17.4   Smokeless tobacco: Never  Vaping Use   Vaping Use: Never used  Substance Use Topics   Alcohol use: Yes    Alcohol/week: 0.0 standard drinks of alcohol    Comment: occasionally   Drug use: No    Comment: past     Colonoscopy:  PAP:  Bone density:  Lipid panel:  Allergies  Allergen Reactions   Amoxicillin Anaphylaxis    REACTION: unspecified   Penicillin G Anaphylaxis   Gabapentin Other (See Comments)    REACTION: anaphylaxis   Penicillins     REACTION: unspecified    Current Outpatient Medications  Medication Sig Dispense Refill   alfuzosin (UROXATRAL) 10 MG 24 hr tablet Take 1 tablet (10 mg total) by mouth at bedtime. 90 tablet 1   ALPRAZolam (XANAX) 0.5 MG tablet Take 1 tablet (0.5 mg total) by mouth 3 (three) times daily as needed for anxiety. 90 tablet 2   amLODipine (NORVASC) 10 MG tablet Take 1 tablet (10 mg total) by mouth daily. 90 tablet 1   atorvastatin (LIPITOR) 10 MG tablet Take 1 tablet (10 mg total) by mouth daily. 90 tablet 1   buPROPion (WELLBUTRIN XL) 300 MG 24 hr tablet TAKE 1 TABLET BY MOUTH DAILY,  GENERIC EQUIVALENT FOR WELLBUTRIN XL. DUE FOR OFFICE VISIT. 90 tablet 1   citalopram (CELEXA) 40 MG tablet Take 1 tablet (40 mg total) by mouth daily. 30 tablet 2   esomeprazole (NEXIUM) 40 MG capsule Take 1 capsule (40 mg total) by mouth 2 (two) times daily before a meal. 60 capsule 3   ferrous sulfate 325 (65 FE) MG EC tablet Take 1 tablet (325 mg total) by mouth every other day. 45 tablet 1   hydrochlorothiazide (HYDRODIURIL) 12.5 MG tablet Take 1 tablet (12.5 mg total) by mouth daily. 90 tablet 1   levothyroxine (SYNTHROID) 150 MCG tablet TAKE 1 TABLET BY MOUTH DAILY BEFORE BREAKFAST. 90 tablet 1   methocarbamol (ROBAXIN) 750 MG tablet Take 750 mg by mouth 4 (four) times daily as needed.  modafinil (PROVIGIL) 200 MG tablet Take 1 tablet (200 mg total) by mouth daily. 30 tablet 2   mometasone-formoterol (DULERA) 100-5 MCG/ACT AERO INHALE 2 PUFFS BY MOUTH INTO THE LUNGS EVERY 12 HOURS 39 g 3   Multiple Vitamin (MULTI-VITAMINS) TABS Take 1 tablet by mouth daily.     predniSONE (STERAPRED UNI-PAK 21 TAB) 10 MG (21) TBPK tablet Use as directed for 6 days 21 tablet 0   pregabalin (LYRICA) 100 MG capsule Take 100 mg by mouth every 8 (eight) hours.     promethazine-dextromethorphan (PROMETHAZINE-DM) 6.25-15 MG/5ML syrup Take 5 mLs by mouth 4 (four) times daily as needed for cough. 180 mL 0   SYRINGE-NEEDLE, DISP, 3 ML 22G X 1" 3 ML MISC Use as directed.     tadalafil (CIALIS) 20 MG tablet Take 0.5-1 tablets (10-20 mg total) by mouth daily as needed for erectile dysfunction. 10 tablet 5   testosterone cypionate (DEPOTESTOSTERONE CYPIONATE) 200 MG/ML injection INJECT 0.75ML ONCE A WEEK AS DIRECTED 10 mL 1   tiZANidine (ZANAFLEX) 4 MG tablet Take 4 mg by mouth every 6 (six) hours as needed for muscle spasms. Pt takes one tablet at night     No current facility-administered medications for this visit.    OBJECTIVE: There were no vitals filed for this visit.      There is no height or weight on  file to calculate BMI.    ECOG FS:0 - Asymptomatic  General: Well-developed, well-nourished, no acute distress. HEENT: Normocephalic. Neuro: Alert, answering all questions appropriately. Cranial nerves grossly intact. Psych: Normal affect.  LAB RESULTS:  Lab Results  Component Value Date   NA 140 07/21/2022   K 3.7 07/21/2022   CL 99 07/21/2022   CO2 26 07/21/2022   GLUCOSE 84 07/21/2022   BUN 14 07/21/2022   CREATININE 1.10 07/21/2022   CALCIUM 9.1 07/21/2022   PROT 7.7 07/21/2022   ALBUMIN 4.3 07/21/2022   AST 16 07/21/2022   ALT 16 07/21/2022   ALKPHOS 78 07/21/2022   BILITOT 0.3 07/21/2022   GFRNONAA >60 07/21/2022   GFRAA >60 05/05/2019    Lab Results  Component Value Date   WBC 7.7 07/21/2022   NEUTROABS 5.2 07/21/2022   HGB 12.5 (L) 07/21/2022   HCT 41.1 07/21/2022   MCV 79.5 (L) 07/21/2022   PLT 324 07/21/2022   Lab Results  Component Value Date   IRON 68 07/21/2022   TIBC 452 (H) 07/21/2022   IRONPCTSAT 15 (L) 07/21/2022   Lab Results  Component Value Date   FERRITIN 15 (L) 07/21/2022     STUDIES: No results found.  ASSESSMENT: Iron deficiency anemia.  PLAN:    Iron deficiency anemia: EGD and colonoscopy on May 09, 2022 did not reveal any significant pathology.  Patient also had capsule endoscopy, but these results are not available at this time.  He denies any further bleeding.  Patient's hemoglobin has significantly improved to 12.5, but his ferritin and iron saturation both remain low.  Proceed with IV Venofer 3 times over the next 2 weeks.  Patient will then return to clinic in 3 months with repeat laboratory work and video-assisted telemedicine visit.   GI bleed: Continue follow-up with GI as needed. Family history of colon cancer:  Patient gets routine colonoscopies given his family history of colon cancer, all of which been unrevealing.  Unclear when his last colonoscopy was completed.  His genetic testing is negative.  I provided 30  minutes of face-to-face video visit time during  this encounter which included chart review, counseling, and coordination of care as documented above.    Patient expressed understanding and was in agreement with this plan. He also understands that He can call clinic at any time with any questions, concerns, or complaints.    Jeralyn Ruths, MD   07/23/2022 1:26 PM

## 2022-07-23 ENCOUNTER — Telehealth: Payer: Self-pay | Admitting: Nurse Practitioner

## 2022-07-23 ENCOUNTER — Encounter: Payer: Self-pay | Admitting: Oncology

## 2022-07-23 NOTE — Telephone Encounter (Signed)
Per Sarah w/ [facility], referral has been closed due to patient not replying to calls to schedule-[name] 

## 2022-07-27 ENCOUNTER — Other Ambulatory Visit: Payer: Self-pay | Admitting: Nurse Practitioner

## 2022-07-27 DIAGNOSIS — Z79899 Other long term (current) drug therapy: Secondary | ICD-10-CM

## 2022-07-27 MED FILL — Iron Sucrose Inj 20 MG/ML (Fe Equiv): INTRAVENOUS | Qty: 10 | Status: AC

## 2022-07-28 ENCOUNTER — Inpatient Hospital Stay: Payer: 59 | Attending: Oncology

## 2022-07-28 VITALS — BP 135/60 | HR 85 | Resp 16

## 2022-07-28 DIAGNOSIS — Z800 Family history of malignant neoplasm of digestive organs: Secondary | ICD-10-CM | POA: Diagnosis not present

## 2022-07-28 DIAGNOSIS — R5383 Other fatigue: Secondary | ICD-10-CM | POA: Insufficient documentation

## 2022-07-28 DIAGNOSIS — Z833 Family history of diabetes mellitus: Secondary | ICD-10-CM | POA: Insufficient documentation

## 2022-07-28 DIAGNOSIS — Z8249 Family history of ischemic heart disease and other diseases of the circulatory system: Secondary | ICD-10-CM | POA: Insufficient documentation

## 2022-07-28 DIAGNOSIS — Z79899 Other long term (current) drug therapy: Secondary | ICD-10-CM | POA: Insufficient documentation

## 2022-07-28 DIAGNOSIS — R531 Weakness: Secondary | ICD-10-CM | POA: Insufficient documentation

## 2022-07-28 DIAGNOSIS — Z801 Family history of malignant neoplasm of trachea, bronchus and lung: Secondary | ICD-10-CM | POA: Insufficient documentation

## 2022-07-28 DIAGNOSIS — Z7289 Other problems related to lifestyle: Secondary | ICD-10-CM | POA: Insufficient documentation

## 2022-07-28 DIAGNOSIS — Z87891 Personal history of nicotine dependence: Secondary | ICD-10-CM | POA: Diagnosis not present

## 2022-07-28 DIAGNOSIS — D509 Iron deficiency anemia, unspecified: Secondary | ICD-10-CM | POA: Diagnosis present

## 2022-07-28 DIAGNOSIS — Z8049 Family history of malignant neoplasm of other genital organs: Secondary | ICD-10-CM | POA: Insufficient documentation

## 2022-07-28 DIAGNOSIS — Z825 Family history of asthma and other chronic lower respiratory diseases: Secondary | ICD-10-CM | POA: Insufficient documentation

## 2022-07-28 DIAGNOSIS — D508 Other iron deficiency anemias: Secondary | ICD-10-CM

## 2022-07-28 MED ORDER — SODIUM CHLORIDE 0.9 % IV SOLN
Freq: Once | INTRAVENOUS | Status: AC
  Filled 2022-07-28: qty 250

## 2022-07-28 MED ORDER — SODIUM CHLORIDE 0.9 % IV SOLN
200.0000 mg | Freq: Once | INTRAVENOUS | Status: AC
  Administered 2022-07-28: 200 mg via INTRAVENOUS
  Filled 2022-07-28: qty 200

## 2022-07-30 ENCOUNTER — Inpatient Hospital Stay: Payer: 59

## 2022-07-30 VITALS — BP 172/74 | HR 84 | Temp 97.9°F | Resp 18

## 2022-07-30 DIAGNOSIS — D509 Iron deficiency anemia, unspecified: Secondary | ICD-10-CM | POA: Diagnosis not present

## 2022-07-30 DIAGNOSIS — D508 Other iron deficiency anemias: Secondary | ICD-10-CM

## 2022-07-30 MED ORDER — SODIUM CHLORIDE 0.9 % IV SOLN
200.0000 mg | Freq: Once | INTRAVENOUS | Status: AC
  Administered 2022-07-30: 200 mg via INTRAVENOUS
  Filled 2022-07-30: qty 10

## 2022-07-30 MED ORDER — SODIUM CHLORIDE 0.9 % IV SOLN
INTRAVENOUS | Status: DC
  Filled 2022-07-30: qty 250

## 2022-08-02 ENCOUNTER — Ambulatory Visit (INDEPENDENT_AMBULATORY_CARE_PROVIDER_SITE_OTHER): Payer: No Typology Code available for payment source | Admitting: Nurse Practitioner

## 2022-08-02 ENCOUNTER — Encounter: Payer: Self-pay | Admitting: Nurse Practitioner

## 2022-08-02 VITALS — BP 130/75 | HR 79 | Temp 98.7°F | Resp 16 | Ht 72.0 in | Wt 216.0 lb

## 2022-08-02 DIAGNOSIS — G47429 Narcolepsy in conditions classified elsewhere without cataplexy: Secondary | ICD-10-CM

## 2022-08-02 DIAGNOSIS — D649 Anemia, unspecified: Secondary | ICD-10-CM | POA: Diagnosis not present

## 2022-08-02 DIAGNOSIS — F410 Panic disorder [episodic paroxysmal anxiety] without agoraphobia: Secondary | ICD-10-CM

## 2022-08-02 DIAGNOSIS — R051 Acute cough: Secondary | ICD-10-CM

## 2022-08-02 DIAGNOSIS — I10 Essential (primary) hypertension: Secondary | ICD-10-CM

## 2022-08-02 DIAGNOSIS — F411 Generalized anxiety disorder: Secondary | ICD-10-CM

## 2022-08-02 MED ORDER — PROMETHAZINE-DM 6.25-15 MG/5ML PO SYRP
5.0000 mL | ORAL_SOLUTION | Freq: Four times a day (QID) | ORAL | 0 refills | Status: DC | PRN
Start: 2022-08-02 — End: 2022-08-17

## 2022-08-02 MED FILL — Iron Sucrose Inj 20 MG/ML (Fe Equiv): INTRAVENOUS | Qty: 10 | Status: AC

## 2022-08-02 NOTE — Progress Notes (Signed)
[facility] [address] [place], [place] [address]  Internal MEDICINE  Office Visit Note  Patient Name: Randy Mclean  [id number]  [id number]  Date of Service: 08/02/2022  Chief Complaint  Patient presents with   Depression   Gastroesophageal Reflux   Hypertension   Hyperlipidemia   Follow-up    F/u med changes    HPI Randy Mclean presents for a follow-up visit for cough, anxiety, hypertension, narcolepsy. Recent URI -- still has productive cough Anxiety -- feeling constantly panicky and still having panic attacks  --tries meditations, deep breathing, progressive muscle relaxation.  Shaky and sx of low blood sugar if not eating.   Hypertension -- BP improved when rechecked.  Narcolepsy -- the modafinil does not always help enough, often feels tired.     Current Medication: Outpatient Encounter Medications as of 08/02/2022  Medication Sig   alfuzosin (UROXATRAL) 10 MG 24 hr tablet Take 1 tablet (10 mg total) by mouth at bedtime.   ALPRAZolam (XANAX) 0.5 MG tablet Take 1 tablet (0.5 mg total) by mouth 3 (three) times daily as needed for anxiety.   amLODipine (NORVASC) 10 MG tablet Take 1 tablet (10 mg total) by mouth daily.   atorvastatin (LIPITOR) 10 MG tablet Take 1 tablet (10 mg total) by mouth daily.   buPROPion (WELLBUTRIN XL) 300 MG 24 hr tablet TAKE 1 TABLET BY MOUTH DAILY, GENERIC EQUIVALENT FOR WELLBUTRIN XL. DUE FOR OFFICE VISIT.   citalopram (CELEXA) 40 MG tablet Take 1 tablet (40 mg total) by mouth daily.   esomeprazole (NEXIUM) 40 MG capsule Take 1 capsule (40 mg total) by mouth 2 (two) times daily before a meal.   ferrous sulfate 325 (65 FE) MG EC tablet Take 1 tablet (325 mg total) by mouth every other day.   hydrochlorothiazide (HYDRODIURIL) 12.5 MG tablet Take 1 tablet (12.5 mg total) by mouth daily.   levothyroxine (SYNTHROID) 150 MCG tablet TAKE 1 TABLET BY MOUTH DAILY BEFORE BREAKFAST.   methocarbamol (ROBAXIN) 750 MG tablet Take 750 mg by  mouth 4 (four) times daily as needed.   modafinil (PROVIGIL) 200 MG tablet Take 1 tablet (200 mg total) by mouth daily.   mometasone-formoterol (DULERA) 100-5 MCG/ACT AERO INHALE 2 PUFFS BY MOUTH INTO THE LUNGS EVERY 12 HOURS   Multiple Vitamin (MULTI-VITAMINS) TABS Take 1 tablet by mouth daily.   predniSONE (STERAPRED UNI-PAK 21 TAB) 10 MG (21) TBPK tablet Use as directed for 6 days   pregabalin (LYRICA) 100 MG capsule Take 100 mg by mouth every 8 (eight) hours.   Sodium Oxybate ER (LUMRYZ) 4.5 g PACK Take 4.5 g by mouth at bedtime.   SYRINGE-NEEDLE, DISP, 3 ML 22G X 1" 3 ML MISC Use as directed.   tadalafil (CIALIS) 20 MG tablet Take 0.5-1 tablets (10-20 mg total) by mouth daily as needed for erectile dysfunction.   testosterone cypionate (DEPOTESTOSTERONE CYPIONATE) 200 MG/ML injection INJECT 0.75ML ONCE A WEEK AS DIRECTED   tiZANidine (ZANAFLEX) 4 MG tablet Take 4 mg by mouth every 6 (six) hours as needed for muscle spasms. Pt takes one tablet at night   valsartan (DIOVAN) 80 MG tablet Take by mouth.   [DISCONTINUED] promethazine-dextromethorphan (PROMETHAZINE-DM) 6.25-15 MG/5ML syrup Take 5 mLs by mouth 4 (four) times daily as needed for cough.   promethazine-dextromethorphan (PROMETHAZINE-DM) 6.25-15 MG/5ML syrup Take 5 mLs by mouth 4 (four) times daily as needed for cough.   No facility-administered encounter medications on file as of 08/02/2022.    Surgical History: Past  Surgical History:  Procedure Laterality Date   ANTERIOR CERVICAL DISCECTOMY     APPENDECTOMY     BACK SURGERY     BACK SURGERY  12/04/2020   COLONOSCOPY  09/2013   COLONOSCOPY N/A 05/09/2022   Procedure: COLONOSCOPY;  Surgeon: Jaynie Collins, DO;  Location: [facility] ENDOSCOPY;  Service: Gastroenterology;  Laterality: N/A;   elbow surgery     ESOPHAGOGASTRODUODENOSCOPY (EGD) WITH PROPOFOL N/A 05/09/2022   Procedure: ESOPHAGOGASTRODUODENOSCOPY (EGD) WITH PROPOFOL;  Surgeon: Jaynie Collins, DO;  Location:  [facility] ENDOSCOPY;  Service: Gastroenterology;  Laterality: N/A;   GIVENS CAPSULE STUDY N/A 05/09/2022   Procedure: GIVENS CAPSULE STUDY;  Surgeon: Jaynie Collins, DO;  Location: [facility] ENDOSCOPY;  Service: Gastroenterology;  Laterality: N/A;   HEMORRHOID SURGERY     LAPAROSCOPIC APPENDECTOMY N/A 12/04/2017   Procedure: APPENDECTOMY LAPAROSCOPIC;  Surgeon: Ancil Linsey, MD;  Location: [facility] ORS;  Service: General;  Laterality: N/A;   LIPOMA EXCISION Right    leg   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     SPINAL CORD STIMULATOR IMPLANT     UPPER GI ENDOSCOPY  09/2013   WRIST SURGERY Left 01/14/2021    Medical History: Past Medical History:  Diagnosis Date   Acute appendicitis 12/04/2017   Allergy    takes allergy shots   Anemia    Arthritis    Asthma    Barrett esophagus    Bronchitis    Chronic pain    Colon polyp    Depression    GERD (gastroesophageal reflux disease)    Hemorrhoid    Hyperlipidemia    Hypertension    Hypothyroid    IBS (irritable bowel syndrome)    Kidney stone    MI (myocardial infarction) (HCC)    between 2002 and 2006   Migraine    Pneumonia    Seizures (HCC)    Sinus problem    Spinal cord stimulator status     Family History: Family History  Problem Relation Age of Onset   Hypertension Mother    Lung disease Mother    Heart attack Father    Diabetes Father    Liver cancer Sister    Lung cancer Sister    Colon cancer Sister    Uterine cancer Sister     Social History   Socioeconomic History   Marital status: Married    Spouse name: Not on file   Number of children: Not on file   Years of education: Not on file   Highest education level: Not on file  Occupational History   Not on file  Tobacco Use   Smoking status: Former    Packs/day: 1.00    Years: 30.00    Additional pack years: 0.00    Total pack years: 30.00    Types: Cigarettes    Quit date: 02/22/2005    Years since quitting: 17.4   Smokeless tobacco:  Never  Vaping Use   Vaping Use: Never used  Substance and Sexual Activity   Alcohol use: Yes    Alcohol/week: 0.0 standard drinks of alcohol    Comment: occasionally   Drug use: No    Comment: past   Sexual activity: Yes  Other Topics Concern   Not on file  Social History Narrative   Not on file   Social Determinants of Health   Financial Resource Strain: Not on file  Food Insecurity: No Food Insecurity (05/07/2022)   Hunger Vital Sign    Worried About Running  Out of Food in the Last Year: Never true    Ran Out of Food in the Last Year: Never true  Transportation Needs: No Transportation Needs (05/07/2022)   PRAPARE - Administrator, Civil Service (Medical): No    Lack of Transportation (Non-Medical): No  Physical Activity: Not on file  Stress: Not on file  Social Connections: Not on file  Intimate Partner Violence: Not At Risk (05/07/2022)   Humiliation, Afraid, Rape, and Kick questionnaire    Fear of Current or Ex-Partner: No    Emotionally Abused: No    Physically Abused: No    Sexually Abused: No      Review of Systems  Constitutional:  Positive for fatigue. Negative for chills and unexpected weight change.  HENT:  Negative for congestion, rhinorrhea, sneezing and sore throat.   Respiratory:  Negative for cough, chest tightness, shortness of breath and wheezing.   Cardiovascular: Negative.  Negative for chest pain and palpitations.  Gastrointestinal:  Negative for abdominal pain, constipation, diarrhea, nausea and vomiting.  Musculoskeletal:  Positive for arthralgias. Negative for back pain, joint swelling and neck pain.  Skin: Negative.   Neurological:  Negative for dizziness, tremors and numbness.  Hematological:  Negative for adenopathy. Does not bruise/bleed easily.  Psychiatric/Behavioral:  Positive for decreased concentration and sleep disturbance. Negative for behavioral problems (Depression), self-injury and suicidal ideas. The patient is  nervous/anxious.     Vital Signs: BP 130/75 Comment: 160/86  Pulse 79   Temp 98.7 F (37.1 C)   Resp 16   Ht 6' (1.829 m)   Wt 216 lb (98 kg)   SpO2 94%   BMI 29.29 kg/m    Physical Exam Vitals reviewed.  Constitutional:      General: He is not in acute distress.    Appearance: Normal appearance. He is not ill-appearing.  HENT:     Head: Normocephalic and atraumatic.  Eyes:     Pupils: Pupils are equal, round, and reactive to light.  Cardiovascular:     Rate and Rhythm: Normal rate and regular rhythm.     Heart sounds: Normal heart sounds. No murmur heard. Pulmonary:     Effort: Pulmonary effort is normal. No respiratory distress.     Breath sounds: Normal breath sounds. No wheezing.  Neurological:     Mental Status: He is alert and oriented to person, place, and time.  Psychiatric:        Mood and Affect: Mood normal.        Behavior: Behavior normal.        Assessment/Plan: 1. Symptomatic anemia Sees hematology for anemia, doing ok for now  2. Primary hypertension Elevated but improved when rechecked, will follow up in 4 weeks to recheck BP  3. Narcolepsy due to underlying condition without cataplexy Will try to get Lumryz approved, will need prior authorization - Sodium Oxybate ER (LUMRYZ) 4.5 g PACK; Take 4.5 g by mouth at bedtime.  Dispense: 30 each; Refill: 2  4. Acute cough Cough medication refilled, take as prescribed.  - promethazine-dextromethorphan (PROMETHAZINE-DM) 6.25-15 MG/5ML syrup; Take 5 mLs by mouth 4 (four) times daily as needed for cough.  Dispense: 180 mL; Refill: 0  5. Generalized anxiety disorder with panic attacks Continue alprazolam as prescribed.    General Counseling: kevian burrola understanding of the findings of todays visit and agrees with plan of treatment. I have discussed any further diagnostic evaluation that may be needed or ordered today. We also reviewed his medications today.  he has been encouraged to call the  office with any questions or concerns that should arise related to todays visit.    No orders of the defined types were placed in this encounter.   Meds ordered this encounter  Medications   promethazine-dextromethorphan (PROMETHAZINE-DM) 6.25-15 MG/5ML syrup    Sig: Take 5 mLs by mouth 4 (four) times daily as needed for cough.    Dispense:  180 mL    Refill:  0   Sodium Oxybate ER (LUMRYZ) 4.5 g PACK    Sig: Take 4.5 g by mouth at bedtime.    Dispense:  30 each    Refill:  2    Send prior authorization please fax to [phone]    Return in about 4 weeks (around 08/30/2022) for F/U, BP check, Anterrio Mccleery PCP.   Total time spent:30 Minutes Time spent includes review of chart, medications, test results, and follow up plan with the patient.   [place] Controlled Substance Database was reviewed by me.  This patient was seen by Sallyanne Kuster, FNP-C in collaboration with Dr. Beverely Risen as a part of collaborative care agreement.   Haadiya Frogge R. Tedd Sias, MSN, FNP-C Internal medicine

## 2022-08-03 ENCOUNTER — Inpatient Hospital Stay: Payer: 59

## 2022-08-03 VITALS — BP 149/69 | HR 82 | Temp 99.1°F | Resp 18

## 2022-08-03 DIAGNOSIS — D509 Iron deficiency anemia, unspecified: Secondary | ICD-10-CM | POA: Diagnosis not present

## 2022-08-03 DIAGNOSIS — D508 Other iron deficiency anemias: Secondary | ICD-10-CM

## 2022-08-03 MED ORDER — SODIUM CHLORIDE 0.9 % IV SOLN
200.0000 mg | Freq: Once | INTRAVENOUS | Status: AC
  Administered 2022-08-03: 200 mg via INTRAVENOUS
  Filled 2022-08-03: qty 200

## 2022-08-03 MED ORDER — SODIUM CHLORIDE 0.9 % IV SOLN
INTRAVENOUS | Status: DC
  Filled 2022-08-03: qty 250

## 2022-08-03 NOTE — Progress Notes (Signed)
Pt offered lab appointment; however, after further discussion, pt decided that he did not want any additional labs at this time, and stated that he has a follow-up with GI next month. Encouraged pt to reach out to GI sooner if symptoms persist.   Declined 30 minute post-observation. Vitals stable at discharge.

## 2022-08-03 NOTE — Progress Notes (Signed)
Pt reports increase in rectal bleeding and intermittent chest pains (pt does report this has been ongoing and it is not a new onset). Pt requests repeat blood work. Dr. Orlie Dakin, Patient's nurse Lorenda Ishihara RN and MD team made aware.

## 2022-08-03 NOTE — Patient Instructions (Signed)

## 2022-08-06 ENCOUNTER — Encounter: Payer: Self-pay | Admitting: Nurse Practitioner

## 2022-08-06 MED ORDER — LUMRYZ 4.5 G PO PACK
4.5000 g | PACK | Freq: Every day | ORAL | 2 refills | Status: DC
Start: 2022-08-06 — End: 2022-09-04

## 2022-08-17 ENCOUNTER — Encounter: Payer: Self-pay | Admitting: Nurse Practitioner

## 2022-08-17 ENCOUNTER — Telehealth: Payer: No Typology Code available for payment source | Admitting: Nurse Practitioner

## 2022-08-17 VITALS — Temp 99.0°F | Ht 72.0 in | Wt 214.0 lb

## 2022-08-17 DIAGNOSIS — J189 Pneumonia, unspecified organism: Secondary | ICD-10-CM

## 2022-08-17 DIAGNOSIS — R053 Chronic cough: Secondary | ICD-10-CM | POA: Diagnosis not present

## 2022-08-17 MED ORDER — PREDNISONE 10 MG PO TABS
ORAL_TABLET | ORAL | 0 refills | Status: DC
Start: 2022-08-17 — End: 2022-11-15

## 2022-08-17 MED ORDER — LEVOFLOXACIN 750 MG PO TABS
750.0000 mg | ORAL_TABLET | Freq: Every day | ORAL | 0 refills | Status: AC
Start: 2022-08-17 — End: 2022-08-27

## 2022-08-17 MED ORDER — PROMETHAZINE-DM 6.25-15 MG/5ML PO SYRP
5.0000 mL | ORAL_SOLUTION | Freq: Four times a day (QID) | ORAL | 0 refills | Status: DC | PRN
Start: 2022-08-17 — End: 2022-09-20

## 2022-08-17 NOTE — Progress Notes (Signed)
[facility] [address] [place], [place] [address]  Internal MEDICINE  Telephone Visit  Patient Name: Randy Mclean Younger  [id number]  [id number]  Date of Service: 08/17/2022  I connected with the patient at 1525 by telephone and verified the patients identity using two identifiers.   I discussed the limitations, risks, security and privacy concerns of performing an evaluation and management service by telephone and the availability of in person appointments. I also discussed with the patient that there may be a patient responsible charge related to the service.  The patient expressed understanding and agrees to proceed.    Chief Complaint  Patient presents with   Telephone Assessment    [phone]   Telephone Screen    Covid test is negative   Sinusitis   Cough   Fever    HPI Randy Mclean presents for a telehealth virtual visit for symptoms of sinusitis.  Covid test is negative  Reports cough,fever, headache, sputum production thick and green.  Symptoms have become worse over the past week, symptoms never fully resolved from the bronchitis that he was treated for in may last month.    Current Medication: Outpatient Encounter Medications as of 08/17/2022  Medication Sig   alfuzosin (UROXATRAL) 10 MG 24 hr tablet Take 1 tablet (10 mg total) by mouth at bedtime.   ALPRAZolam (XANAX) 0.5 MG tablet Take 1 tablet (0.5 mg total) by mouth 3 (three) times daily as needed for anxiety.   amLODipine (NORVASC) 10 MG tablet Take 1 tablet (10 mg total) by mouth daily.   atorvastatin (LIPITOR) 10 MG tablet Take 1 tablet (10 mg total) by mouth daily.   buPROPion (WELLBUTRIN XL) 300 MG 24 hr tablet TAKE 1 TABLET BY MOUTH DAILY, GENERIC EQUIVALENT FOR WELLBUTRIN XL. DUE FOR OFFICE VISIT.   citalopram (CELEXA) 40 MG tablet Take 1 tablet (40 mg total) by mouth daily.   esomeprazole (NEXIUM) 40 MG capsule Take 1 capsule (40 mg total) by mouth 2 (two) times daily before a meal.    ferrous sulfate 325 (65 FE) MG EC tablet Take 1 tablet (325 mg total) by mouth every other day.   hydrochlorothiazide (HYDRODIURIL) 12.5 MG tablet Take 1 tablet (12.5 mg total) by mouth daily.   levothyroxine (SYNTHROID) 150 MCG tablet TAKE 1 TABLET BY MOUTH DAILY BEFORE BREAKFAST.   methocarbamol (ROBAXIN) 750 MG tablet Take 750 mg by mouth 4 (four) times daily as needed.   modafinil (PROVIGIL) 200 MG tablet Take 1 tablet (200 mg total) by mouth daily.   mometasone-formoterol (DULERA) 100-5 MCG/ACT AERO INHALE 2 PUFFS BY MOUTH INTO THE LUNGS EVERY 12 HOURS   Multiple Vitamin (MULTI-VITAMINS) TABS Take 1 tablet by mouth daily.   pregabalin (LYRICA) 100 MG capsule Take 100 mg by mouth every 8 (eight) hours.   Sodium Oxybate ER (LUMRYZ) 4.5 g PACK Take 4.5 g by mouth at bedtime.   SYRINGE-NEEDLE, DISP, 3 ML 22G X 1" 3 ML MISC Use as directed.   tadalafil (CIALIS) 20 MG tablet Take 0.5-1 tablets (10-20 mg total) by mouth daily as needed for erectile dysfunction.   testosterone cypionate (DEPOTESTOSTERONE CYPIONATE) 200 MG/ML injection INJECT 0.75ML ONCE A WEEK AS DIRECTED   tiZANidine (ZANAFLEX) 4 MG tablet Take 4 mg by mouth every 6 (six) hours as needed for muscle spasms. Pt takes one tablet at night   valsartan (DIOVAN) 80 MG tablet Take by mouth.   [DISCONTINUED] promethazine-dextromethorphan (PROMETHAZINE-DM) 6.25-15 MG/5ML syrup Take 5 mLs by mouth 4 (four) times  daily as needed for cough.   levofloxacin (LEVAQUIN) 750 MG tablet Take 1 tablet (750 mg total) by mouth daily for 10 days. Take with food.   predniSONE (DELTASONE) 10 MG tablet Take one tab 3 x day for 3 days, then take one tab 2 x a day for 3 days and then take one tab a day for 3 days for copd   promethazine-dextromethorphan (PROMETHAZINE-DM) 6.25-15 MG/5ML syrup Take 5 mLs by mouth 4 (four) times daily as needed for cough.   No facility-administered encounter medications on file as of 08/17/2022.    Surgical History: Past  Surgical History:  Procedure Laterality Date   ANTERIOR CERVICAL DISCECTOMY     APPENDECTOMY     BACK SURGERY     BACK SURGERY  12/04/2020   COLONOSCOPY  09/2013   COLONOSCOPY N/A 05/09/2022   Procedure: COLONOSCOPY;  Surgeon: Jaynie Collins, DO;  Location: [facility] ENDOSCOPY;  Service: Gastroenterology;  Laterality: N/A;   elbow surgery     ESOPHAGOGASTRODUODENOSCOPY (EGD) WITH PROPOFOL N/A 05/09/2022   Procedure: ESOPHAGOGASTRODUODENOSCOPY (EGD) WITH PROPOFOL;  Surgeon: Jaynie Collins, DO;  Location: [facility] ENDOSCOPY;  Service: Gastroenterology;  Laterality: N/A;   GIVENS CAPSULE STUDY N/A 05/09/2022   Procedure: GIVENS CAPSULE STUDY;  Surgeon: Jaynie Collins, DO;  Location: [facility] ENDOSCOPY;  Service: Gastroenterology;  Laterality: N/A;   HEMORRHOID SURGERY     LAPAROSCOPIC APPENDECTOMY N/A 12/04/2017   Procedure: APPENDECTOMY LAPAROSCOPIC;  Surgeon: Ancil Linsey, MD;  Location: [facility] ORS;  Service: General;  Laterality: N/A;   LIPOMA EXCISION Right    leg   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     SPINAL CORD STIMULATOR IMPLANT     UPPER GI ENDOSCOPY  09/2013   WRIST SURGERY Left 01/14/2021    Medical History: Past Medical History:  Diagnosis Date   Acute appendicitis 12/04/2017   Allergy    takes allergy shots   Anemia    Arthritis    Asthma    Barrett esophagus    Bronchitis    Chronic pain    Colon polyp    Depression    GERD (gastroesophageal reflux disease)    Hemorrhoid    Hyperlipidemia    Hypertension    Hypothyroid    IBS (irritable bowel syndrome)    Kidney stone    MI (myocardial infarction) (HCC)    between 2002 and 2006   Migraine    Pneumonia    Seizures (HCC)    Sinus problem    Spinal cord stimulator status     Family History: Family History  Problem Relation Age of Onset   Hypertension Mother    Lung disease Mother    Heart attack Father    Diabetes Father    Liver cancer Sister    Lung cancer Sister    Colon  cancer Sister    Uterine cancer Sister     Social History   Socioeconomic History   Marital status: Married    Spouse name: Not on file   Number of children: Not on file   Years of education: Not on file   Highest education level: Not on file  Occupational History   Not on file  Tobacco Use   Smoking status: Former    Packs/day: 1.00    Years: 30.00    Additional pack years: 0.00    Total pack years: 30.00    Types: Cigarettes    Quit date: 02/22/2005    Years since quitting: 17.4  Smokeless tobacco: Never  Vaping Use   Vaping Use: Never used  Substance and Sexual Activity   Alcohol use: Yes    Alcohol/week: 0.0 standard drinks of alcohol    Comment: occasionally   Drug use: No    Comment: past   Sexual activity: Yes  Other Topics Concern   Not on file  Social History Narrative   Not on file   Social Determinants of Health   Financial Resource Strain: Not on file  Food Insecurity: No Food Insecurity (05/07/2022)   Hunger Vital Sign    Worried About Running Out of Food in the Last Year: Never true    Ran Out of Food in the Last Year: Never true  Transportation Needs: No Transportation Needs (05/07/2022)   PRAPARE - Administrator, Civil Service (Medical): No    Lack of Transportation (Non-Medical): No  Physical Activity: Not on file  Stress: Not on file  Social Connections: Not on file  Intimate Partner Violence: Not At Risk (05/07/2022)   Humiliation, Afraid, Rape, and Kick questionnaire    Fear of Current or Ex-Partner: No    Emotionally Abused: No    Physically Abused: No    Sexually Abused: No      Review of Systems  Constitutional:  Positive for fatigue and fever. Negative for chills.  HENT:  Positive for postnasal drip. Negative for congestion, rhinorrhea, sinus pressure, sinus pain and sore throat.   Eyes:  Negative for pain.  Respiratory:  Positive for cough. Negative for chest tightness, shortness of breath and wheezing.    Cardiovascular: Negative.  Negative for chest pain and palpitations.  Gastrointestinal:  Negative for abdominal pain, constipation, diarrhea, nausea and vomiting.  Musculoskeletal:  Negative for myalgias.  Neurological:  Positive for headaches.    Vital Signs: Temp 99 F (37.2 C)   Ht 6' (1.829 m)   Wt 214 lb (97.1 kg)   BMI 29.02 kg/m    Observation/Objective: He is alert and oriented and engages in conversation appropriately. No acute distress noted. Having persistent and frequent coughing fits over video. Can hear congestion rattling over the audio.     Assessment/Plan: 1. Walking pneumonia 10 day course of levofloxacin prescribed and 9 day prednisone taper.  - predniSONE (DELTASONE) 10 MG tablet; Take one tab 3 x day for 3 days, then take one tab 2 x a day for 3 days and then take one tab a day for 3 days for copd  Dispense: 18 tablet; Refill: 0 - levofloxacin (LEVAQUIN) 750 MG tablet; Take 1 tablet (750 mg total) by mouth daily for 10 days. Take with food.  Dispense: 10 tablet; Refill: 0  2. Persistent cough for 3 weeks or longer Chest xray and cough medication prescribed.  - promethazine-dextromethorphan (PROMETHAZINE-DM) 6.25-15 MG/5ML syrup; Take 5 mLs by mouth 4 (four) times daily as needed for cough.  Dispense: 180 mL; Refill: 0 - DG Chest 2 View; Future   General Counseling: eldor conaway understanding of the findings of today's phone visit and agrees with plan of treatment. I have discussed any further diagnostic evaluation that may be needed or ordered today. We also reviewed his medications today. he has been encouraged to call the office with any questions or concerns that should arise related to todays visit.  Return if symptoms worsen or fail to improve.   Orders Placed This Encounter  Procedures   DG Chest 2 View    Meds ordered this encounter  Medications   promethazine-dextromethorphan (  PROMETHAZINE-DM) 6.25-15 MG/5ML syrup    Sig: Take 5 mLs by  mouth 4 (four) times daily as needed for cough.    Dispense:  180 mL    Refill:  0   predniSONE (DELTASONE) 10 MG tablet    Sig: Take one tab 3 x day for 3 days, then take one tab 2 x a day for 3 days and then take one tab a day for 3 days for copd    Dispense:  18 tablet    Refill:  0   levofloxacin (LEVAQUIN) 750 MG tablet    Sig: Take 1 tablet (750 mg total) by mouth daily for 10 days. Take with food.    Dispense:  10 tablet    Refill:  0    Time spent:10 Minutes Time spent with patient included reviewing progress notes, labs, imaging studies, and discussing plan for follow up.  [place] Controlled Substance Database was reviewed by me for overdose risk score (ORS) if appropriate.  This patient was seen by Sallyanne Kuster, FNP-C in collaboration with Dr. Beverely Risen as a part of collaborative care agreement.  Kayleana Waites R. Tedd Sias, MSN, FNP-C Internal medicine

## 2022-08-18 ENCOUNTER — Ambulatory Visit
Admission: RE | Admit: 2022-08-18 | Discharge: 2022-08-18 | Disposition: A | Discharge status: Home / Self Care | Payer: 59 | Source: Ambulatory Visit | Attending: Nurse Practitioner | Admitting: Nurse Practitioner

## 2022-08-18 ENCOUNTER — Ambulatory Visit
Admission: RE | Admit: 2022-08-18 | Discharge: 2022-08-18 | Disposition: A | Discharge status: Home / Self Care | Payer: 59 | Attending: Nurse Practitioner | Admitting: Nurse Practitioner

## 2022-08-18 DIAGNOSIS — R053 Chronic cough: Secondary | ICD-10-CM | POA: Diagnosis present

## 2022-08-25 NOTE — Progress Notes (Signed)
Will discuss results at upcoming visit.

## 2022-08-31 ENCOUNTER — Encounter: Payer: Self-pay | Admitting: Nurse Practitioner

## 2022-08-31 ENCOUNTER — Ambulatory Visit: Payer: No Typology Code available for payment source | Admitting: Nurse Practitioner

## 2022-08-31 VITALS — BP 130/70 | HR 87 | Temp 98.1°F | Resp 16 | Ht 72.0 in | Wt 222.2 lb

## 2022-08-31 DIAGNOSIS — F411 Generalized anxiety disorder: Secondary | ICD-10-CM | POA: Diagnosis not present

## 2022-08-31 DIAGNOSIS — R053 Chronic cough: Secondary | ICD-10-CM | POA: Diagnosis not present

## 2022-08-31 DIAGNOSIS — D649 Anemia, unspecified: Secondary | ICD-10-CM

## 2022-08-31 DIAGNOSIS — G47429 Narcolepsy in conditions classified elsewhere without cataplexy: Secondary | ICD-10-CM | POA: Diagnosis not present

## 2022-08-31 DIAGNOSIS — F410 Panic disorder [episodic paroxysmal anxiety] without agoraphobia: Secondary | ICD-10-CM

## 2022-08-31 DIAGNOSIS — Z79899 Other long term (current) drug therapy: Secondary | ICD-10-CM

## 2022-08-31 NOTE — Progress Notes (Signed)
[facility] [address] [place], [place] [address]  Internal MEDICINE  Office Visit Note  Patient Name: Randy Mclean Younger  [id number]  [id number]  Date of Service: 08/31/2022  Chief Complaint  Patient presents with   Follow-up    F/u bp     HPI Jerrid presents for a follow-up visit for chronic cough, narcolepsy and anemia.  Chronic cough -- URI and pneumonia has been treated and ruled out as causes for this chronic cough. Recent xray was negative for pneumonia or other acute abnormalities. Onset of chronic cough was at least 5 months ago. He has GERD but this is treated and under control with esomeprazole. He does have some environmental allergies which he takes antihistamines only as needed. He does have COPD but his baseline symptoms and cough have not been this severe previously. He takes dulera to help control his respiratory symptoms. He is not on an ACE inhibitor for BP control but he is currently taking valsartan which is an ARB and can cause a nagging dry cough as a side effect. He has not previously had this issue and was taking losartan before being switched to valsartan. His cough is sometimes dry but more often productive, reports that the sputum is either green or yellow usually. He has been on multiple courses of antibiotics over the past several months. With the antibiotics and sometimes a steroid taper, his symptoms will improve briefly but never resolve and then will worsen after the medication course is complete. Narcolepsy -- was diagnosed several years ago. Currently manages condition with taking modafinil during the day and previously was taking adderal when he was not on modafinil. He reports that modafinil helps some but he feels like it could be more effective. He is often very tired and does have episodes sometimes but not as severe as they were without medication. Discussed newer medications available to help control narcoleptic episodes. He is interested  in trying other medication that may be more effective.  Iron deficiency Anemia -- was hospitalized in march for a GI bleed. He is still being closely monitored by hematology as well. His most recent iron infusion was in June, last month.  Anxiety -- takes alprazolam as needed for anxiety and panic attacks, continues to use nonpharmacologic interventions including meditation, deep breathing and progressive muscle relaxation.     Current Medication: Outpatient Encounter Medications as of 08/31/2022  Medication Sig   alfuzosin (UROXATRAL) 10 MG 24 hr tablet Take 1 tablet (10 mg total) by mouth at bedtime.   ALPRAZolam (XANAX) 0.5 MG tablet Take 1 tablet (0.5 mg total) by mouth 3 (three) times daily as needed for anxiety.   amLODipine (NORVASC) 10 MG tablet Take 1 tablet (10 mg total) by mouth daily.   atorvastatin (LIPITOR) 10 MG tablet Take 1 tablet (10 mg total) by mouth daily.   buPROPion (WELLBUTRIN XL) 300 MG 24 hr tablet TAKE 1 TABLET BY MOUTH DAILY, GENERIC EQUIVALENT FOR WELLBUTRIN XL. DUE FOR OFFICE VISIT.   citalopram (CELEXA) 40 MG tablet Take 1 tablet (40 mg total) by mouth daily.   esomeprazole (NEXIUM) 40 MG capsule Take 1 capsule (40 mg total) by mouth 2 (two) times daily before a meal.   ferrous sulfate 325 (65 FE) MG EC tablet Take 1 tablet (325 mg total) by mouth every other day.   hydrochlorothiazide (HYDRODIURIL) 12.5 MG tablet Take 1 tablet (12.5 mg total) by mouth daily.   levothyroxine (SYNTHROID) 150 MCG tablet TAKE 1 TABLET BY  MOUTH DAILY BEFORE BREAKFAST.   methocarbamol (ROBAXIN) 750 MG tablet Take 750 mg by mouth 4 (four) times daily as needed.   modafinil (PROVIGIL) 200 MG tablet Take 1 tablet (200 mg total) by mouth daily.   mometasone-formoterol (DULERA) 100-5 MCG/ACT AERO INHALE 2 PUFFS BY MOUTH INTO THE LUNGS EVERY 12 HOURS   Multiple Vitamin (MULTI-VITAMINS) TABS Take 1 tablet by mouth daily.   predniSONE (DELTASONE) 10 MG tablet Take one tab 3 x day for 3 days,  then take one tab 2 x a day for 3 days and then take one tab a day for 3 days for copd   pregabalin (LYRICA) 100 MG capsule Take 100 mg by mouth every 8 (eight) hours.   promethazine-dextromethorphan (PROMETHAZINE-DM) 6.25-15 MG/5ML syrup Take 5 mLs by mouth 4 (four) times daily as needed for cough.   SYRINGE-NEEDLE, DISP, 3 ML 22G X 1" 3 ML MISC Use as directed.   tadalafil (CIALIS) 20 MG tablet Take 0.5-1 tablets (10-20 mg total) by mouth daily as needed for erectile dysfunction.   testosterone cypionate (DEPOTESTOSTERONE CYPIONATE) 200 MG/ML injection INJECT 0.75ML ONCE A WEEK AS DIRECTED   tiZANidine (ZANAFLEX) 4 MG tablet Take 4 mg by mouth every 6 (six) hours as needed for muscle spasms. Pt takes one tablet at night   valsartan (DIOVAN) 80 MG tablet Take by mouth.   [DISCONTINUED] Sodium Oxybate ER (LUMRYZ) 4.5 g PACK Take 4.5 g by mouth at bedtime.   No facility-administered encounter medications on file as of 08/31/2022.    Surgical History: Past Surgical History:  Procedure Laterality Date   ANTERIOR CERVICAL DISCECTOMY     APPENDECTOMY     BACK SURGERY     BACK SURGERY  12/04/2020   COLONOSCOPY  09/2013   COLONOSCOPY N/A 05/09/2022   Procedure: COLONOSCOPY;  Surgeon: Jaynie Collins, DO;  Location: [facility] ENDOSCOPY;  Service: Gastroenterology;  Laterality: N/A;   elbow surgery     ESOPHAGOGASTRODUODENOSCOPY (EGD) WITH PROPOFOL N/A 05/09/2022   Procedure: ESOPHAGOGASTRODUODENOSCOPY (EGD) WITH PROPOFOL;  Surgeon: Jaynie Collins, DO;  Location: [facility] ENDOSCOPY;  Service: Gastroenterology;  Laterality: N/A;   GIVENS CAPSULE STUDY N/A 05/09/2022   Procedure: GIVENS CAPSULE STUDY;  Surgeon: Jaynie Collins, DO;  Location: [facility] ENDOSCOPY;  Service: Gastroenterology;  Laterality: N/A;   HEMORRHOID SURGERY     LAPAROSCOPIC APPENDECTOMY N/A 12/04/2017   Procedure: APPENDECTOMY LAPAROSCOPIC;  Surgeon: Ancil Linsey, MD;  Location: [facility] ORS;  Service: General;  Laterality:  N/A;   LIPOMA EXCISION Right    leg   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     SPINAL CORD STIMULATOR IMPLANT     UPPER GI ENDOSCOPY  09/2013   WRIST SURGERY Left 01/14/2021    Medical History: Past Medical History:  Diagnosis Date   Acute appendicitis 12/04/2017   Allergy    takes allergy shots   Anemia    Arthritis    Asthma    Barrett esophagus    Bronchitis    Chronic pain    Colon polyp    Depression    GERD (gastroesophageal reflux disease)    Hemorrhoid    Hyperlipidemia    Hypertension    Hypothyroid    IBS (irritable bowel syndrome)    Kidney stone    MI (myocardial infarction) (HCC)    between 2002 and 2006   Migraine    Pneumonia    Seizures (HCC)    Sinus problem    Spinal cord stimulator status  Family History: Family History  Problem Relation Age of Onset   Hypertension Mother    Lung disease Mother    Heart attack Father    Diabetes Father    Liver cancer Sister    Lung cancer Sister    Colon cancer Sister    Uterine cancer Sister     Social History   Socioeconomic History   Marital status: Married    Spouse name: Not on file   Number of children: Not on file   Years of education: Not on file   Highest education level: Not on file  Occupational History   Not on file  Tobacco Use   Smoking status: Former    Current packs/day: 0.00    Average packs/day: 1 pack/day for 30.0 years (30.0 ttl pk-yrs)    Types: Cigarettes    Start date: 02/23/1975    Quit date: 02/22/2005    Years since quitting: 17.5   Smokeless tobacco: Never  Vaping Use   Vaping status: Never Used  Substance and Sexual Activity   Alcohol use: Yes    Alcohol/week: 0.0 standard drinks of alcohol    Comment: occasionally   Drug use: No    Comment: past   Sexual activity: Yes  Other Topics Concern   Not on file  Social History Narrative   Not on file   Social Determinants of Health   Financial Resource Strain: Not on file  Food Insecurity: No Food  Insecurity (05/07/2022)   Hunger Vital Sign    Worried About Running Out of Food in the Last Year: Never true    Ran Out of Food in the Last Year: Never true  Transportation Needs: No Transportation Needs (05/07/2022)   PRAPARE - Administrator, Civil Service (Medical): No    Lack of Transportation (Non-Medical): No  Physical Activity: Not on file  Stress: Not on file  Social Connections: Not on file  Intimate Partner Violence: Not At Risk (05/07/2022)   Humiliation, Afraid, Rape, and Kick questionnaire    Fear of Current or Ex-Partner: No    Emotionally Abused: No    Physically Abused: No    Sexually Abused: No      Review of Systems  Constitutional:  Positive for activity change, fatigue and fever. Negative for chills.  HENT:  Positive for postnasal drip. Negative for congestion, rhinorrhea, sinus pressure, sinus pain and sore throat.   Eyes:  Negative for pain.  Respiratory:  Positive for cough (productive with yellow or green sputum). Negative for chest tightness, shortness of breath and wheezing.   Cardiovascular: Negative.  Negative for chest pain and palpitations.  Gastrointestinal:  Negative for abdominal pain, constipation, diarrhea, nausea and vomiting.  Musculoskeletal: Negative.  Negative for myalgias.  Skin: Negative.   Neurological:  Positive for headaches.    Vital Signs: BP 130/70 Comment: 140/76  Pulse 87   Temp 98.1 F (36.7 C)   Resp 16   Ht 6' (1.829 m)   Wt 222 lb 3.2 oz (100.8 kg)   SpO2 96%   BMI 30.14 kg/m    Physical Exam Vitals reviewed.  Constitutional:      General: He is not in acute distress.    Appearance: Normal appearance. He is ill-appearing.  HENT:     Head: Normocephalic and atraumatic.  Eyes:     Pupils: Pupils are equal, round, and reactive to light.  Cardiovascular:     Rate and Rhythm: Normal rate and regular rhythm.  Heart sounds: Normal heart sounds. No murmur heard. Pulmonary:     Effort: Pulmonary effort  is normal. No respiratory distress.     Breath sounds: Normal breath sounds. No wheezing.  Neurological:     Mental Status: He is alert and oriented to person, place, and time.  Psychiatric:        Mood and Affect: Mood normal.        Behavior: Behavior normal.        Assessment/Plan: 1. Chronic cough Sputum cytology ordered to rule out eosinophilic asthmatic bronchitis, TB and other abnormalities. CT chest ordered to rule out other changes or abnormalities that could cause a chronic cough. Pending results, will discuss additional testing if needed such as upper GI series, upper endoscopy, or esophageal manometry at his follow up visit.  - CT Chest Wo Contrast; Future - Sputum Cytology  2. Narcolepsy due to underlying condition without cataplexy Forms completed to submit to [facility] for patient to start this medication if approved by insurance. Will see if this improves and controls his episodes better.   3. Symptomatic anemia Continue following up with hematology as instructed.   4. Generalized anxiety disorder with panic attacks Continue alprazolam as prescribed and continue nonpharmacologic interventions as needed.    General Counseling: teaghan formica understanding of the findings of todays visit and agrees with plan of treatment. I have discussed any further diagnostic evaluation that may be needed or ordered today. We also reviewed his medications today. he has been encouraged to call the office with any questions or concerns that should arise related to todays visit.    Orders Placed This Encounter  Procedures   CT Chest Wo Contrast    No orders of the defined types were placed in this encounter.   Return in about 4 weeks (around 09/28/2022) for F/U, Review labs/test, Macky Galik PCP .   Total time spent:30 Minutes Time spent includes review of chart, medications, test results, and follow up plan with the patient.   [place] Controlled Substance Database was reviewed by  me.  This patient was seen by Sallyanne Kuster, FNP-C in collaboration with Dr. Beverely Risen as a part of collaborative care agreement.   Vaden Becherer R. Tedd Sias, MSN, FNP-C Internal medicine

## 2022-09-01 ENCOUNTER — Telehealth: Payer: Self-pay | Admitting: Nurse Practitioner

## 2022-09-01 NOTE — Telephone Encounter (Signed)
Lvm notifying patient of CT appointment date, arrival time, location-[name] 

## 2022-09-04 ENCOUNTER — Encounter: Payer: Self-pay | Admitting: Nurse Practitioner

## 2022-09-06 ENCOUNTER — Telehealth: Payer: Self-pay

## 2022-09-06 NOTE — Telephone Encounter (Addendum)
Faxed pres as per alyssa to [facility] [phone] and [employer] [phone]

## 2022-09-08 ENCOUNTER — Ambulatory Visit
Admission: RE | Admit: 2022-09-08 | Discharge: 2022-09-08 | Disposition: A | Discharge status: Home / Self Care | Payer: 59 | Source: Ambulatory Visit | Attending: Nurse Practitioner | Admitting: Nurse Practitioner

## 2022-09-08 DIAGNOSIS — R053 Chronic cough: Secondary | ICD-10-CM | POA: Insufficient documentation

## 2022-09-14 ENCOUNTER — Telehealth: Payer: Self-pay

## 2022-09-14 NOTE — Telephone Encounter (Signed)
Pt called stating yesterday he had a coughing episode and while he was at work yesterday he got up and walk to the bathroom and came back and he doesn't remember sitting down but woke up like 2 minutes later and was in the floor, states he has a cut above his eye and a cut on his knee and has a headache, spoke with dfk and she stated to just monitored his symptoms and advise to go to ER if worsen.

## 2022-09-15 NOTE — Telephone Encounter (Signed)
Ct results are in now.

## 2022-09-19 ENCOUNTER — Telehealth: Payer: Self-pay | Admitting: Nurse Practitioner

## 2022-09-19 NOTE — Telephone Encounter (Signed)
Patient lvm on after-hour line. Tested + for covid. High fever. Scheduled for virutual in morning-[name]

## 2022-09-20 ENCOUNTER — Telehealth: Payer: No Typology Code available for payment source | Admitting: Nurse Practitioner

## 2022-09-20 ENCOUNTER — Encounter: Payer: Self-pay | Admitting: Nurse Practitioner

## 2022-09-20 VITALS — Temp 101.0°F | Resp 16 | Ht 72.0 in

## 2022-09-20 DIAGNOSIS — U071 COVID-19: Secondary | ICD-10-CM

## 2022-09-20 DIAGNOSIS — J069 Acute upper respiratory infection, unspecified: Secondary | ICD-10-CM

## 2022-09-20 DIAGNOSIS — R053 Chronic cough: Secondary | ICD-10-CM | POA: Diagnosis not present

## 2022-09-20 MED ORDER — PROMETHAZINE-DM 6.25-15 MG/5ML PO SYRP
5.0000 mL | ORAL_SOLUTION | Freq: Four times a day (QID) | ORAL | 0 refills | Status: DC | PRN
Start: 2022-09-20 — End: 2022-12-28

## 2022-09-20 MED ORDER — NIRMATRELVIR/RITONAVIR (PAXLOVID)TABLET
3.0000 | ORAL_TABLET | Freq: Two times a day (BID) | ORAL | 0 refills | Status: AC
Start: 2022-09-20 — End: 2022-09-25

## 2022-09-20 NOTE — Progress Notes (Signed)
[facility] [address] [place], [place] [address]  Internal MEDICINE  Telephone Visit  Patient Name: Randy Mclean  [id number]  [id number]  Date of Service: 09/20/2022  I connected with the patient at 0815 by telephone and verified the patients identity using two identifiers.   I discussed the limitations, risks, security and privacy concerns of performing an evaluation and management service by telephone and the availability of in person appointments. I also discussed with the patient that there may be a patient responsible charge related to the service.  The patient expressed understanding and agrees to proceed.    Chief Complaint  Patient presents with   Telephone Screen   Telephone Assessment   Covid Positive   Cough   Generalized Body Aches   Fever   Nausea    No appetite    HPI Randy Mclean presents for a telehealth virtual visit for covid positive URI x2 days.  Symptoms started 2 days ago.  Reports fever, headache, sore throat, severe cough, chills, nausea and decreased appetite, body aches Requesting cough syrup and is interested in taking antiviral medication    Current Medication: Outpatient Encounter Medications as of 09/20/2022  Medication Sig   alfuzosin (UROXATRAL) 10 MG 24 hr tablet Take 1 tablet (10 mg total) by mouth at bedtime.   ALPRAZolam (XANAX) 0.5 MG tablet Take 1 tablet (0.5 mg total) by mouth 3 (three) times daily as needed for anxiety.   amLODipine (NORVASC) 10 MG tablet Take 1 tablet (10 mg total) by mouth daily.   atorvastatin (LIPITOR) 10 MG tablet Take 1 tablet (10 mg total) by mouth daily.   buPROPion (WELLBUTRIN XL) 300 MG 24 hr tablet TAKE 1 TABLET BY MOUTH DAILY, GENERIC EQUIVALENT FOR WELLBUTRIN XL. DUE FOR OFFICE VISIT.   citalopram (CELEXA) 40 MG tablet Take 1 tablet (40 mg total) by mouth daily.   esomeprazole (NEXIUM) 40 MG capsule Take 1 capsule (40 mg total) by mouth 2 (two) times daily before a meal.   ferrous sulfate  325 (65 FE) MG EC tablet Take 1 tablet (325 mg total) by mouth every other day.   hydrochlorothiazide (HYDRODIURIL) 12.5 MG tablet Take 1 tablet (12.5 mg total) by mouth daily.   levothyroxine (SYNTHROID) 150 MCG tablet TAKE 1 TABLET BY MOUTH DAILY BEFORE BREAKFAST.   methocarbamol (ROBAXIN) 750 MG tablet Take 750 mg by mouth 4 (four) times daily as needed.   modafinil (PROVIGIL) 200 MG tablet Take 1 tablet (200 mg total) by mouth daily.   mometasone-formoterol (DULERA) 100-5 MCG/ACT AERO INHALE 2 PUFFS BY MOUTH INTO THE LUNGS EVERY 12 HOURS   Multiple Vitamin (MULTI-VITAMINS) TABS Take 1 tablet by mouth daily.   nirmatrelvir/ritonavir (PAXLOVID) 20 x 150 MG & 10 x 100MG  TABS Take 3 tablets by mouth 2 (two) times daily for 5 days.   predniSONE (DELTASONE) 10 MG tablet Take one tab 3 x day for 3 days, then take one tab 2 x a day for 3 days and then take one tab a day for 3 days for copd   pregabalin (LYRICA) 100 MG capsule Take 100 mg by mouth every 8 (eight) hours.   SYRINGE-NEEDLE, DISP, 3 ML 22G X 1" 3 ML MISC Use as directed.   tadalafil (CIALIS) 20 MG tablet Take 0.5-1 tablets (10-20 mg total) by mouth daily as needed for erectile dysfunction.   testosterone cypionate (DEPOTESTOSTERONE CYPIONATE) 200 MG/ML injection INJECT 0.75ML ONCE A WEEK AS DIRECTED   tiZANidine (ZANAFLEX) 4 MG tablet Take  4 mg by mouth every 6 (six) hours as needed for muscle spasms. Pt takes one tablet at night   valsartan (DIOVAN) 80 MG tablet Take by mouth.   [DISCONTINUED] promethazine-dextromethorphan (PROMETHAZINE-DM) 6.25-15 MG/5ML syrup Take 5 mLs by mouth 4 (four) times daily as needed for cough.   promethazine-dextromethorphan (PROMETHAZINE-DM) 6.25-15 MG/5ML syrup Take 5 mLs by mouth 4 (four) times daily as needed for cough.   No facility-administered encounter medications on file as of 09/20/2022.    Surgical History: Past Surgical History:  Procedure Laterality Date   ANTERIOR CERVICAL DISCECTOMY      APPENDECTOMY     BACK SURGERY     BACK SURGERY  12/04/2020   COLONOSCOPY  09/2013   COLONOSCOPY N/A 05/09/2022   Procedure: COLONOSCOPY;  Surgeon: Jaynie Collins, DO;  Location: [facility] ENDOSCOPY;  Service: Gastroenterology;  Laterality: N/A;   elbow surgery     ESOPHAGOGASTRODUODENOSCOPY (EGD) WITH PROPOFOL N/A 05/09/2022   Procedure: ESOPHAGOGASTRODUODENOSCOPY (EGD) WITH PROPOFOL;  Surgeon: Jaynie Collins, DO;  Location: [facility] ENDOSCOPY;  Service: Gastroenterology;  Laterality: N/A;   GIVENS CAPSULE STUDY N/A 05/09/2022   Procedure: GIVENS CAPSULE STUDY;  Surgeon: Jaynie Collins, DO;  Location: [facility] ENDOSCOPY;  Service: Gastroenterology;  Laterality: N/A;   HEMORRHOID SURGERY     LAPAROSCOPIC APPENDECTOMY N/A 12/04/2017   Procedure: APPENDECTOMY LAPAROSCOPIC;  Surgeon: Ancil Linsey, MD;  Location: [facility] ORS;  Service: General;  Laterality: N/A;   LIPOMA EXCISION Right    leg   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     SPINAL CORD STIMULATOR IMPLANT     UPPER GI ENDOSCOPY  09/2013   WRIST SURGERY Left 01/14/2021    Medical History: Past Medical History:  Diagnosis Date   Acute appendicitis 12/04/2017   Allergy    takes allergy shots   Anemia    Arthritis    Asthma    Barrett esophagus    Bronchitis    Chronic pain    Colon polyp    Depression    GERD (gastroesophageal reflux disease)    Hemorrhoid    Hyperlipidemia    Hypertension    Hypothyroid    IBS (irritable bowel syndrome)    Kidney stone    MI (myocardial infarction) (HCC)    between 2002 and 2006   Migraine    Pneumonia    Seizures (HCC)    Sinus problem    Spinal cord stimulator status     Family History: Family History  Problem Relation Age of Onset   Hypertension Mother    Lung disease Mother    Heart attack Father    Diabetes Father    Liver cancer Sister    Lung cancer Sister    Colon cancer Sister    Uterine cancer Sister     Social History   Socioeconomic  History   Marital status: Married    Spouse name: Not on file   Number of children: Not on file   Years of education: Not on file   Highest education level: Not on file  Occupational History   Not on file  Tobacco Use   Smoking status: Former    Current packs/day: 0.00    Average packs/day: 1 pack/day for 30.0 years (30.0 ttl pk-yrs)    Types: Cigarettes    Start date: 02/23/1975    Quit date: 02/22/2005    Years since quitting: 17.5   Smokeless tobacco: Never  Vaping Use   Vaping status: Never Used  Substance and Sexual Activity   Alcohol use: Yes    Alcohol/week: 0.0 standard drinks of alcohol    Comment: occasionally   Drug use: No    Comment: past   Sexual activity: Yes  Other Topics Concern   Not on file  Social History Narrative   Not on file   Social Determinants of Health   Financial Resource Strain: Not on file  Food Insecurity: No Food Insecurity (05/07/2022)   Hunger Vital Sign    Worried About Running Out of Food in the Last Year: Never true    Ran Out of Food in the Last Year: Never true  Transportation Needs: No Transportation Needs (05/07/2022)   PRAPARE - Administrator, Civil Service (Medical): No    Lack of Transportation (Non-Medical): No  Physical Activity: Not on file  Stress: Not on file  Social Connections: Not on file  Intimate Partner Violence: Not At Risk (05/07/2022)   Humiliation, Afraid, Rape, and Kick questionnaire    Fear of Current or Ex-Partner: No    Emotionally Abused: No    Physically Abused: No    Sexually Abused: No      Review of Systems  Constitutional:  Positive for appetite change, chills and fever.  Respiratory:  Positive for cough.   Gastrointestinal:  Positive for nausea. Negative for constipation, diarrhea and vomiting.    Vital Signs: Temp (!) 101 F (38.3 C)   Resp 16   Ht 6' (1.829 m)   BMI 30.14 kg/m    Observation/Objective: He is alert and oriented and engages in conversation appropriately.  No acute distress is noted. Wet productive cough observed.     Assessment/Plan: 1. Upper respiratory tract infection due to COVID-19 virus Paxlovid prescribed.  - nirmatrelvir/ritonavir (PAXLOVID) 20 x 150 MG & 10 x 100MG  TABS; Take 3 tablets by mouth 2 (two) times daily for 5 days.  Dispense: 30 tablet; Refill: 0  2. Persistent cough for 3 weeks or longer May continue prn medicat - promethazine-dextromethorphan (PROMETHAZINE-DM) 6.25-15 MG/5ML syrup; Take 5 mLs by mouth 4 (four) times daily as needed for cough.  Dispense: 180 mL; Refill: 0   General Counseling: Karrington verbalizes understanding of the findings of today's phone visit and agrees with plan of treatment. I have discussed any further diagnostic evaluation that may be needed or ordered today. We also reviewed his medications today. he has been encouraged to call the office with any questions or concerns that should arise related to todays visit.  Return if symptoms worsen or fail to improve, for and keep regular scheduled follow up appts.   No orders of the defined types were placed in this encounter.   Meds ordered this encounter  Medications   nirmatrelvir/ritonavir (PAXLOVID) 20 x 150 MG & 10 x 100MG  TABS    Sig: Take 3 tablets by mouth 2 (two) times daily for 5 days.    Dispense:  30 tablet    Refill:  0   promethazine-dextromethorphan (PROMETHAZINE-DM) 6.25-15 MG/5ML syrup    Sig: Take 5 mLs by mouth 4 (four) times daily as needed for cough.    Dispense:  180 mL    Refill:  0    Time spent:10 Minutes Time spent with patient included reviewing progress notes, labs, imaging studies, and discussing plan for follow up.  [place] Controlled Substance Database was reviewed by me for overdose risk score (ORS) if appropriate.  This patient was seen by Sallyanne Kuster, FNP-C in collaboration with Dr. Tora Kindred  Welton Flakes as a part of collaborative care agreement.  Ramiah Helfrich R. Tedd Sias, MSN, FNP-C Internal medicine

## 2022-09-21 NOTE — Telephone Encounter (Signed)
Already discuss with [name].

## 2022-09-27 ENCOUNTER — Encounter: Payer: Self-pay | Admitting: Nurse Practitioner

## 2022-09-27 ENCOUNTER — Ambulatory Visit: Payer: No Typology Code available for payment source | Admitting: Nurse Practitioner

## 2022-09-27 VITALS — BP 130/68 | HR 88 | Temp 98.6°F | Resp 16 | Ht 72.0 in | Wt 216.8 lb

## 2022-09-27 DIAGNOSIS — G47429 Narcolepsy in conditions classified elsewhere without cataplexy: Secondary | ICD-10-CM

## 2022-09-27 DIAGNOSIS — J432 Centrilobular emphysema: Secondary | ICD-10-CM | POA: Diagnosis not present

## 2022-09-27 DIAGNOSIS — R053 Chronic cough: Secondary | ICD-10-CM

## 2022-09-27 DIAGNOSIS — Z79899 Other long term (current) drug therapy: Secondary | ICD-10-CM | POA: Diagnosis not present

## 2022-09-27 MED ORDER — MODAFINIL 200 MG PO TABS
200.0000 mg | ORAL_TABLET | Freq: Every day | ORAL | 2 refills | Status: DC
Start: 2022-09-27 — End: 2023-01-13

## 2022-09-27 MED ORDER — PREGABALIN 100 MG PO CAPS
100.0000 mg | ORAL_CAPSULE | Freq: Three times a day (TID) | ORAL | 0 refills | Status: AC
Start: 2022-09-27 — End: ?

## 2022-09-27 NOTE — Progress Notes (Signed)
[facility] [address] [place], [place] [address]  Internal MEDICINE  Office Visit Note  Patient Name: Randy Mclean Younger  [id number]  [id number]  Date of Service: 09/27/2022  Chief Complaint  Patient presents with   Follow-up    Review ct     HPI Havyn presents for a follow-up visit for chronic cough, narcolepsy, and tobacco use.  CT scan discussed, shows emphysema and aortic atherosclerosis. No suspicious pulmonary nodules.  Chronic cough still present. Waiting for sputum cytology.  Narcolepsy -- still working on prior approval for [employer].     Current Medication: Outpatient Encounter Medications as of 09/27/2022  Medication Sig   alfuzosin (UROXATRAL) 10 MG 24 hr tablet Take 1 tablet (10 mg total) by mouth at bedtime.   ALPRAZolam (XANAX) 0.5 MG tablet Take 1 tablet (0.5 mg total) by mouth 3 (three) times daily as needed for anxiety.   amLODipine (NORVASC) 10 MG tablet Take 1 tablet (10 mg total) by mouth daily.   atorvastatin (LIPITOR) 10 MG tablet Take 1 tablet (10 mg total) by mouth daily.   buPROPion (WELLBUTRIN XL) 300 MG 24 hr tablet TAKE 1 TABLET BY MOUTH DAILY, GENERIC EQUIVALENT FOR WELLBUTRIN XL. DUE FOR OFFICE VISIT.   citalopram (CELEXA) 40 MG tablet Take 1 tablet (40 mg total) by mouth daily.   ferrous sulfate 325 (65 FE) MG EC tablet Take 1 tablet (325 mg total) by mouth every other day.   levothyroxine (SYNTHROID) 150 MCG tablet TAKE 1 TABLET BY MOUTH DAILY BEFORE BREAKFAST.   methocarbamol (ROBAXIN) 750 MG tablet Take 750 mg by mouth 4 (four) times daily as needed.   mometasone-formoterol (DULERA) 100-5 MCG/ACT AERO INHALE 2 PUFFS BY MOUTH INTO THE LUNGS EVERY 12 HOURS   Multiple Vitamin (MULTI-VITAMINS) TABS Take 1 tablet by mouth daily.   predniSONE (DELTASONE) 10 MG tablet Take one tab 3 x day for 3 days, then take one tab 2 x a day for 3 days and then take one tab a day for 3 days for copd   promethazine-dextromethorphan (PROMETHAZINE-DM)  6.25-15 MG/5ML syrup Take 5 mLs by mouth 4 (four) times daily as needed for cough.   SYRINGE-NEEDLE, DISP, 3 ML 22G X 1" 3 ML MISC Use as directed.   tadalafil (CIALIS) 20 MG tablet Take 0.5-1 tablets (10-20 mg total) by mouth daily as needed for erectile dysfunction.   testosterone cypionate (DEPOTESTOSTERONE CYPIONATE) 200 MG/ML injection INJECT 0.75ML ONCE A WEEK AS DIRECTED   tiZANidine (ZANAFLEX) 4 MG tablet Take 4 mg by mouth every 6 (six) hours as needed for muscle spasms. Pt takes one tablet at night   valsartan (DIOVAN) 80 MG tablet Take by mouth.   [DISCONTINUED] esomeprazole (NEXIUM) 40 MG capsule Take 1 capsule (40 mg total) by mouth 2 (two) times daily before a meal.   [DISCONTINUED] hydrochlorothiazide (HYDRODIURIL) 12.5 MG tablet Take 1 tablet (12.5 mg total) by mouth daily.   [DISCONTINUED] modafinil (PROVIGIL) 200 MG tablet Take 1 tablet (200 mg total) by mouth daily.   [DISCONTINUED] pregabalin (LYRICA) 100 MG capsule Take 100 mg by mouth every 8 (eight) hours.   modafinil (PROVIGIL) 200 MG tablet Take 1 tablet (200 mg total) by mouth daily.   pregabalin (LYRICA) 100 MG capsule Take 1 capsule (100 mg total) by mouth every 8 (eight) hours.   No facility-administered encounter medications on file as of 09/27/2022.    Surgical History: Past Surgical History:  Procedure Laterality Date   ANTERIOR CERVICAL DISCECTOMY  APPENDECTOMY     BACK SURGERY     BACK SURGERY  12/04/2020   COLONOSCOPY  09/2013   COLONOSCOPY N/A 05/09/2022   Procedure: COLONOSCOPY;  Surgeon: Jaynie Collins, DO;  Location: [facility] ENDOSCOPY;  Service: Gastroenterology;  Laterality: N/A;   elbow surgery     ESOPHAGOGASTRODUODENOSCOPY (EGD) WITH PROPOFOL N/A 05/09/2022   Procedure: ESOPHAGOGASTRODUODENOSCOPY (EGD) WITH PROPOFOL;  Surgeon: Jaynie Collins, DO;  Location: [facility] ENDOSCOPY;  Service: Gastroenterology;  Laterality: N/A;   GIVENS CAPSULE STUDY N/A 05/09/2022   Procedure: GIVENS CAPSULE  STUDY;  Surgeon: Jaynie Collins, DO;  Location: [facility] ENDOSCOPY;  Service: Gastroenterology;  Laterality: N/A;   HEMORRHOID SURGERY     LAPAROSCOPIC APPENDECTOMY N/A 12/04/2017   Procedure: APPENDECTOMY LAPAROSCOPIC;  Surgeon: Ancil Linsey, MD;  Location: [facility] ORS;  Service: General;  Laterality: N/A;   LIPOMA EXCISION Right    leg   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     SPINAL CORD STIMULATOR IMPLANT     UPPER GI ENDOSCOPY  09/2013   WRIST SURGERY Left 01/14/2021    Medical History: Past Medical History:  Diagnosis Date   Acute appendicitis 12/04/2017   Allergy    takes allergy shots   Anemia    Arthritis    Asthma    Barrett esophagus    Bronchitis    Chronic pain    Colon polyp    Depression    GERD (gastroesophageal reflux disease)    Hemorrhoid    Hyperlipidemia    Hypertension    Hypothyroid    IBS (irritable bowel syndrome)    Kidney stone    MI (myocardial infarction) (HCC)    between 2002 and 2006   Migraine    Pneumonia    Seizures (HCC)    Sinus problem    Spinal cord stimulator status     Family History: Family History  Problem Relation Age of Onset   Hypertension Mother    Lung disease Mother    Heart attack Father    Diabetes Father    Liver cancer Sister    Lung cancer Sister    Colon cancer Sister    Uterine cancer Sister     Social History   Socioeconomic History   Marital status: Married    Spouse name: Not on file   Number of children: Not on file   Years of education: Not on file   Highest education level: Not on file  Occupational History   Not on file  Tobacco Use   Smoking status: Former    Current packs/day: 0.00    Average packs/day: 1 pack/day for 30.0 years (30.0 ttl pk-yrs)    Types: Cigarettes    Start date: 02/23/1975    Quit date: 02/22/2005    Years since quitting: 17.6   Smokeless tobacco: Never  Vaping Use   Vaping status: Never Used  Substance and Sexual Activity   Alcohol use: Yes     Alcohol/week: 0.0 standard drinks of alcohol    Comment: occasionally   Drug use: No    Comment: past   Sexual activity: Yes  Other Topics Concern   Not on file  Social History Narrative   Not on file   Social Determinants of Health   Financial Resource Strain: Not on file  Food Insecurity: No Food Insecurity (05/07/2022)   Hunger Vital Sign    Worried About Running Out of Food in the Last Year: Never true    Ran Out  of Food in the Last Year: Never true  Transportation Needs: No Transportation Needs (05/07/2022)   PRAPARE - Administrator, Civil Service (Medical): No    Lack of Transportation (Non-Medical): No  Physical Activity: Not on file  Stress: Not on file  Social Connections: Not on file  Intimate Partner Violence: Not At Risk (05/07/2022)   Humiliation, Afraid, Rape, and Kick questionnaire    Fear of Current or Ex-Partner: No    Emotionally Abused: No    Physically Abused: No    Sexually Abused: No      Review of Systems  Constitutional:  Positive for activity change, fatigue and fever. Negative for chills.  HENT:  Positive for postnasal drip. Negative for congestion, rhinorrhea, sinus pressure, sinus pain and sore throat.   Eyes:  Negative for pain.  Respiratory:  Positive for cough (productive with yellow or green sputum). Negative for chest tightness, shortness of breath and wheezing.   Cardiovascular: Negative.  Negative for chest pain and palpitations.  Gastrointestinal:  Negative for abdominal pain, constipation, diarrhea, nausea and vomiting.  Musculoskeletal: Negative.  Negative for myalgias.  Skin: Negative.   Neurological:  Positive for headaches.    Vital Signs: BP 130/68   Pulse 88   Temp 98.6 F (37 C)   Resp 16   Ht 6' (1.829 m)   Wt 216 lb 12.8 oz (98.3 kg)   SpO2 97%   BMI 29.40 kg/m    Physical Exam Vitals reviewed.  Constitutional:      General: He is not in acute distress.    Appearance: Normal appearance. He is  ill-appearing.  HENT:     Head: Normocephalic and atraumatic.  Eyes:     Pupils: Pupils are equal, round, and reactive to light.  Cardiovascular:     Rate and Rhythm: Normal rate and regular rhythm.     Heart sounds: Normal heart sounds. No murmur heard. Pulmonary:     Effort: Pulmonary effort is normal. No respiratory distress.     Breath sounds: Normal breath sounds. No wheezing.  Neurological:     Mental Status: He is alert and oriented to person, place, and time.  Psychiatric:        Mood and Affect: Mood normal.        Behavior: Behavior normal.        Assessment/Plan: 1. Chronic cough PFT and sputum cytology ordered, follow up to discuss results.  - Sputum Cytology - Pulmonary function test; Future  2. Centrilobular emphysema (HCC) PFT and sputum cytology ordered, will discuss results.  - Sputum Cytology - Pulmonary function test; Future  3. Narcolepsy due to underlying condition without cataplexy Working on approval for [employer]  4. Encounter for medication review Refills ordered. Small amount of lyrica ordered to prevent patient from going into withdrawal until he can get refill from his pain medicine specialist.  - pregabalin (LYRICA) 100 MG capsule; Take 1 capsule (100 mg total) by mouth every 8 (eight) hours.  Dispense: 10 capsule; Refill: 0 - modafinil (PROVIGIL) 200 MG tablet; Take 1 tablet (200 mg total) by mouth daily.  Dispense: 30 tablet; Refill: 2   General Counseling: Donovin verbalizes understanding of the findings of todays visit and agrees with plan of treatment. I have discussed any further diagnostic evaluation that may be needed or ordered today. We also reviewed his medications today. he has been encouraged to call the office with any questions or concerns that should arise related to todays visit.  Orders Placed This Encounter  Procedures   Pulmonary function test    Meds ordered this encounter  Medications   pregabalin (LYRICA) 100 MG  capsule    Sig: Take 1 capsule (100 mg total) by mouth every 8 (eight) hours.    Dispense:  10 capsule    Refill:  0    Please fill today, patient ran out a few days ago, experiencing withdrawal symptoms.   modafinil (PROVIGIL) 200 MG tablet    Sig: Take 1 tablet (200 mg total) by mouth daily.    Dispense:  30 tablet    Refill:  2    refills    Return in about 1 month (around 10/28/2022) for F/U, PFT @ [place] [place] [place] P[facility].   Total time spent:30 Minutes Time spent includes review of chart, medications, test results, and follow up plan with the patient.   [place] Controlled Substance Database was reviewed by me.  This patient was seen by Sallyanne Kuster, FNP-C in collaboration with Dr. Beverely Risen as a part of collaborative care agreement.   Tauno Falotico R. Tedd Sias, MSN, FNP-C Internal medicine

## 2022-09-28 ENCOUNTER — Telehealth: Payer: Self-pay | Admitting: Nurse Practitioner

## 2022-09-28 NOTE — Telephone Encounter (Signed)
Lvm to schedule pft & follow up-[name]

## 2022-09-29 ENCOUNTER — Telehealth: Payer: Self-pay | Admitting: Nurse Practitioner

## 2022-09-29 ENCOUNTER — Other Ambulatory Visit: Payer: Self-pay | Admitting: Nurse Practitioner

## 2022-09-29 DIAGNOSIS — Z79899 Other long term (current) drug therapy: Secondary | ICD-10-CM

## 2022-09-29 NOTE — Telephone Encounter (Signed)
Lvm again and sent message to schedule pft & follow up-Randy Mclean

## 2022-09-30 ENCOUNTER — Other Ambulatory Visit: Payer: Self-pay | Admitting: Nurse Practitioner

## 2022-09-30 DIAGNOSIS — Z79899 Other long term (current) drug therapy: Secondary | ICD-10-CM

## 2022-10-05 ENCOUNTER — Telehealth: Payer: Self-pay | Admitting: Nurse Practitioner

## 2022-10-05 NOTE — Telephone Encounter (Signed)
Lvm on patient's work phone to return my call to schedule pft & follow up-[name]

## 2022-10-09 ENCOUNTER — Encounter: Payer: Self-pay | Admitting: Nurse Practitioner

## 2022-10-11 ENCOUNTER — Encounter: Payer: Self-pay | Admitting: Oncology

## 2022-10-15 ENCOUNTER — Other Ambulatory Visit: Payer: Self-pay

## 2022-10-15 DIAGNOSIS — D509 Iron deficiency anemia, unspecified: Secondary | ICD-10-CM

## 2022-10-15 DIAGNOSIS — E559 Vitamin D deficiency, unspecified: Secondary | ICD-10-CM

## 2022-10-16 ENCOUNTER — Other Ambulatory Visit: Payer: Self-pay | Admitting: Nurse Practitioner

## 2022-10-16 DIAGNOSIS — J189 Pneumonia, unspecified organism: Secondary | ICD-10-CM

## 2022-10-17 ENCOUNTER — Other Ambulatory Visit: Payer: Self-pay | Admitting: Obstetrics and Gynecology

## 2022-10-17 ENCOUNTER — Other Ambulatory Visit: Payer: Self-pay | Admitting: Nurse Practitioner

## 2022-10-17 DIAGNOSIS — N401 Enlarged prostate with lower urinary tract symptoms: Secondary | ICD-10-CM

## 2022-10-17 DIAGNOSIS — E782 Mixed hyperlipidemia: Secondary | ICD-10-CM

## 2022-10-17 DIAGNOSIS — Z79899 Other long term (current) drug therapy: Secondary | ICD-10-CM

## 2022-10-17 DIAGNOSIS — E039 Hypothyroidism, unspecified: Secondary | ICD-10-CM

## 2022-10-17 DIAGNOSIS — I10 Essential (primary) hypertension: Secondary | ICD-10-CM

## 2022-10-18 ENCOUNTER — Other Ambulatory Visit: Payer: Self-pay | Admitting: Internal Medicine

## 2022-10-18 ENCOUNTER — Inpatient Hospital Stay: Payer: No Typology Code available for payment source | Attending: Oncology

## 2022-10-18 ENCOUNTER — Telehealth: Payer: Self-pay

## 2022-10-18 DIAGNOSIS — E559 Vitamin D deficiency, unspecified: Secondary | ICD-10-CM

## 2022-10-18 DIAGNOSIS — Z79899 Other long term (current) drug therapy: Secondary | ICD-10-CM | POA: Diagnosis not present

## 2022-10-18 DIAGNOSIS — D509 Iron deficiency anemia, unspecified: Secondary | ICD-10-CM | POA: Diagnosis present

## 2022-10-18 DIAGNOSIS — E291 Testicular hypofunction: Secondary | ICD-10-CM

## 2022-10-18 LAB — CBC WITH DIFFERENTIAL (CANCER CENTER ONLY)
Abs Immature Granulocytes: 0.02 10*3/uL (ref 0.00–0.07)
Basophils Absolute: 0.1 10*3/uL (ref 0.0–0.1)
Basophils Relative: 1 %
Eosinophils Absolute: 0 10*3/uL (ref 0.0–0.5)
Eosinophils Relative: 0 %
HCT: 26.8 % — ABNORMAL LOW (ref 39.0–52.0)
Hemoglobin: 7.7 g/dL — ABNORMAL LOW (ref 13.0–17.0)
Immature Granulocytes: 0 %
Lymphocytes Relative: 13 %
Lymphs Abs: 1 10*3/uL (ref 0.7–4.0)
MCH: 22.8 pg — ABNORMAL LOW (ref 26.0–34.0)
MCHC: 28.7 g/dL — ABNORMAL LOW (ref 30.0–36.0)
MCV: 79.5 fL — ABNORMAL LOW (ref 80.0–100.0)
Monocytes Absolute: 0.6 10*3/uL (ref 0.1–1.0)
Monocytes Relative: 9 %
Neutro Abs: 5.4 10*3/uL (ref 1.7–7.7)
Neutrophils Relative %: 77 %
Platelet Count: 255 10*3/uL (ref 150–400)
RBC: 3.37 MIL/uL — ABNORMAL LOW (ref 4.22–5.81)
RDW: 15.9 % — ABNORMAL HIGH (ref 11.5–15.5)
WBC Count: 7.1 10*3/uL (ref 4.0–10.5)
nRBC: 0 % (ref 0.0–0.2)

## 2022-10-18 LAB — CMP (CANCER CENTER ONLY)
ALT: 20 U/L (ref 0–44)
AST: 18 U/L (ref 15–41)
Albumin: 4.1 g/dL (ref 3.5–5.0)
Alkaline Phosphatase: 64 U/L (ref 38–126)
Anion gap: 7 (ref 5–15)
BUN: 9 mg/dL (ref 6–20)
CO2: 28 mmol/L (ref 22–32)
Calcium: 8.8 mg/dL — ABNORMAL LOW (ref 8.9–10.3)
Chloride: 102 mmol/L (ref 98–111)
Creatinine: 1.14 mg/dL (ref 0.61–1.24)
GFR, Estimated: >60 mL/min (ref >60)
Glucose, Bld: 97 mg/dL (ref 70–99)
Potassium: 3.5 mmol/L (ref 3.5–5.1)
Sodium: 137 mmol/L (ref 135–145)
Total Bilirubin: 0.4 mg/dL (ref 0.3–1.2)
Total Protein: 7 g/dL (ref 6.5–8.1)

## 2022-10-18 LAB — IRON AND TIBC
Iron: 8 ug/dL — ABNORMAL LOW (ref 45–182)
Saturation Ratios: 2 % — ABNORMAL LOW (ref 17.9–39.5)
TIBC: 388 ug/dL (ref 250–450)
UIBC: 380 ug/dL

## 2022-10-18 LAB — VITAMIN B12: Vitamin B-12: 282 pg/mL (ref 180–914)

## 2022-10-18 LAB — FERRITIN: Ferritin: 6 ng/mL — ABNORMAL LOW (ref 24–336)

## 2022-10-18 NOTE — Telephone Encounter (Signed)
[facility] that [facility] not [facility] pres said 1 day  early and pt is going out of town tomorrow as per [employer] and spoke with [facility] phar about his Modafinil its should ready today due today any way

## 2022-10-18 NOTE — Telephone Encounter (Signed)
Please send this med

## 2022-10-19 ENCOUNTER — Inpatient Hospital Stay: Payer: No Typology Code available for payment source | Admitting: Oncology

## 2022-10-19 ENCOUNTER — Encounter: Payer: Self-pay | Admitting: Oncology

## 2022-10-19 NOTE — Progress Notes (Signed)
  [facility]  [phone]) [phone] [phone]) [phone]  ID: Randy Mclean Younger OB: 04/11/1960  MR#: [id number]  YQM#:[id number]  Patient Care Team: Sallyanne Kuster, NP as PCP - General (Nurse Practitioner) Lyndon Code, MD (Internal Medicine) Jeralyn Ruths, MD as Consulting Physician (Oncology)     Jeralyn Ruths, MD   10/19/2022 2:47 PM     This encounter was created in error - please disregard.

## 2022-10-20 ENCOUNTER — Telehealth: Payer: Self-pay

## 2022-10-20 ENCOUNTER — Encounter: Payer: Self-pay | Admitting: Oncology

## 2022-10-20 ENCOUNTER — Telehealth: Payer: Self-pay | Admitting: Internal Medicine

## 2022-10-20 NOTE — Telephone Encounter (Signed)
Left vm and sent mychart message to confirm 10/27/22 appointment-Randy Mclean

## 2022-10-20 NOTE — Telephone Encounter (Signed)
Attempt to call patient.

## 2022-10-27 ENCOUNTER — Ambulatory Visit (INDEPENDENT_AMBULATORY_CARE_PROVIDER_SITE_OTHER): Payer: No Typology Code available for payment source | Admitting: Internal Medicine

## 2022-10-27 DIAGNOSIS — J432 Centrilobular emphysema: Secondary | ICD-10-CM

## 2022-10-27 DIAGNOSIS — R053 Chronic cough: Secondary | ICD-10-CM

## 2022-10-28 ENCOUNTER — Inpatient Hospital Stay: Payer: No Typology Code available for payment source

## 2022-10-28 ENCOUNTER — Encounter: Payer: Self-pay | Admitting: Oncology

## 2022-10-28 ENCOUNTER — Other Ambulatory Visit: Payer: Self-pay | Admitting: Oncology

## 2022-10-28 ENCOUNTER — Inpatient Hospital Stay: Payer: No Typology Code available for payment source | Attending: Oncology | Admitting: Oncology

## 2022-10-28 ENCOUNTER — Other Ambulatory Visit: Payer: Self-pay | Admitting: *Deleted

## 2022-10-28 VITALS — BP 136/62 | HR 79

## 2022-10-28 VITALS — BP 105/63 | HR 85 | Temp 98.5°F | Resp 16 | Ht 72.0 in | Wt 220.0 lb

## 2022-10-28 DIAGNOSIS — Z79890 Hormone replacement therapy (postmenopausal): Secondary | ICD-10-CM | POA: Diagnosis not present

## 2022-10-28 DIAGNOSIS — R55 Syncope and collapse: Secondary | ICD-10-CM | POA: Diagnosis not present

## 2022-10-28 DIAGNOSIS — Z79899 Other long term (current) drug therapy: Secondary | ICD-10-CM | POA: Insufficient documentation

## 2022-10-28 DIAGNOSIS — E039 Hypothyroidism, unspecified: Secondary | ICD-10-CM | POA: Diagnosis not present

## 2022-10-28 DIAGNOSIS — D509 Iron deficiency anemia, unspecified: Secondary | ICD-10-CM

## 2022-10-28 DIAGNOSIS — G8929 Other chronic pain: Secondary | ICD-10-CM | POA: Diagnosis not present

## 2022-10-28 DIAGNOSIS — D508 Other iron deficiency anemias: Secondary | ICD-10-CM

## 2022-10-28 DIAGNOSIS — Z833 Family history of diabetes mellitus: Secondary | ICD-10-CM | POA: Insufficient documentation

## 2022-10-28 DIAGNOSIS — R531 Weakness: Secondary | ICD-10-CM | POA: Insufficient documentation

## 2022-10-28 DIAGNOSIS — J45909 Unspecified asthma, uncomplicated: Secondary | ICD-10-CM | POA: Insufficient documentation

## 2022-10-28 DIAGNOSIS — Z800 Family history of malignant neoplasm of digestive organs: Secondary | ICD-10-CM | POA: Diagnosis not present

## 2022-10-28 DIAGNOSIS — I252 Old myocardial infarction: Secondary | ICD-10-CM | POA: Insufficient documentation

## 2022-10-28 DIAGNOSIS — Z7951 Long term (current) use of inhaled steroids: Secondary | ICD-10-CM | POA: Insufficient documentation

## 2022-10-28 DIAGNOSIS — K922 Gastrointestinal hemorrhage, unspecified: Secondary | ICD-10-CM | POA: Insufficient documentation

## 2022-10-28 DIAGNOSIS — Z87891 Personal history of nicotine dependence: Secondary | ICD-10-CM | POA: Insufficient documentation

## 2022-10-28 DIAGNOSIS — R5383 Other fatigue: Secondary | ICD-10-CM | POA: Diagnosis not present

## 2022-10-28 DIAGNOSIS — E785 Hyperlipidemia, unspecified: Secondary | ICD-10-CM | POA: Diagnosis not present

## 2022-10-28 DIAGNOSIS — Z880 Allergy status to penicillin: Secondary | ICD-10-CM | POA: Insufficient documentation

## 2022-10-28 DIAGNOSIS — Z9049 Acquired absence of other specified parts of digestive tract: Secondary | ICD-10-CM | POA: Diagnosis not present

## 2022-10-28 DIAGNOSIS — Z801 Family history of malignant neoplasm of trachea, bronchus and lung: Secondary | ICD-10-CM | POA: Diagnosis not present

## 2022-10-28 DIAGNOSIS — Z8719 Personal history of other diseases of the digestive system: Secondary | ICD-10-CM | POA: Diagnosis not present

## 2022-10-28 DIAGNOSIS — Z888 Allergy status to other drugs, medicaments and biological substances status: Secondary | ICD-10-CM | POA: Diagnosis not present

## 2022-10-28 DIAGNOSIS — Z8249 Family history of ischemic heart disease and other diseases of the circulatory system: Secondary | ICD-10-CM | POA: Insufficient documentation

## 2022-10-28 LAB — CBC WITH DIFFERENTIAL/PLATELET
Abs Immature Granulocytes: 0.02 10*3/uL (ref 0.00–0.07)
Basophils Absolute: 0.1 10*3/uL (ref 0.0–0.1)
Basophils Relative: 1 %
Eosinophils Absolute: 0 10*3/uL (ref 0.0–0.5)
Eosinophils Relative: 1 %
HCT: 23 % — ABNORMAL LOW (ref 39.0–52.0)
Hemoglobin: 6.4 g/dL — CL (ref 13.0–17.0)
Immature Granulocytes: 0 %
Lymphocytes Relative: 15 %
Lymphs Abs: 0.9 10*3/uL (ref 0.7–4.0)
MCH: 21.7 pg — ABNORMAL LOW (ref 26.0–34.0)
MCHC: 27.8 g/dL — ABNORMAL LOW (ref 30.0–36.0)
MCV: 78 fL — ABNORMAL LOW (ref 80.0–100.0)
Monocytes Absolute: 0.5 10*3/uL (ref 0.1–1.0)
Monocytes Relative: 9 %
Neutro Abs: 4.2 10*3/uL (ref 1.7–7.7)
Neutrophils Relative %: 74 %
Platelets: 293 10*3/uL (ref 150–400)
RBC: 2.95 MIL/uL — ABNORMAL LOW (ref 4.22–5.81)
RDW: 15.9 % — ABNORMAL HIGH (ref 11.5–15.5)
WBC: 5.8 10*3/uL (ref 4.0–10.5)
nRBC: 0 % (ref 0.0–0.2)

## 2022-10-28 LAB — PREPARE RBC (CROSSMATCH)

## 2022-10-28 MED ORDER — SODIUM CHLORIDE 0.9 % IV SOLN
Freq: Once | INTRAVENOUS | Status: AC
  Filled 2022-10-28: qty 250

## 2022-10-28 MED ORDER — SODIUM CHLORIDE 0.9 % IV SOLN
200.0000 mg | Freq: Once | INTRAVENOUS | Status: AC
  Administered 2022-10-28: 200 mg via INTRAVENOUS
  Filled 2022-10-28: qty 200

## 2022-10-28 NOTE — Patient Instructions (Signed)
 Iron Sucrose Injection What is this medication? IRON SUCROSE (EYE ern SOO krose) treats low levels of iron (iron deficiency anemia) in people with kidney disease. Iron is a mineral that plays an important role in making red blood cells, which carry oxygen from your lungs to the rest of your body. This medicine may be used for other purposes; ask your health care provider or pharmacist if you have questions. COMMON BRAND NAME(S): Venofer What should I tell my care team before I take this medication? They need to know if you have any of these conditions: Anemia not caused by low iron levels Heart disease High levels of iron in the blood Kidney disease Liver disease An unusual or allergic reaction to iron, other medications, foods, dyes, or preservatives Pregnant or trying to get pregnant Breastfeeding How should I use this medication? This medication is for infusion into a vein. It is given in a hospital or clinic setting. Talk to your care team about the use of this medication in children. While this medication may be prescribed for children as young as 2 years for selected conditions, precautions do apply. Overdosage: If you think you have taken too much of this medicine contact a poison control center or emergency room at once. NOTE: This medicine is only for you. Do not share this medicine with others. What if I miss a dose? Keep appointments for follow-up doses. It is important not to miss your dose. Call your care team if you are unable to keep an appointment. What may interact with this medication? Do not take this medication with any of the following: Deferoxamine Dimercaprol Other iron products This medication may also interact with the following: Chloramphenicol Deferasirox This list may not describe all possible interactions. Give your health care provider a list of all the medicines, herbs, non-prescription drugs, or dietary supplements you use. Also tell them if you smoke,  drink alcohol, or use illegal drugs. Some items may interact with your medicine. What should I watch for while using this medication? Visit your care team regularly. Tell your care team if your symptoms do not start to get better or if they get worse. You may need blood work done while you are taking this medication. You may need to follow a special diet. Talk to your care team. Foods that contain iron include: whole grains/cereals, dried fruits, beans, or peas, leafy green vegetables, and organ meats (liver, kidney). What side effects may I notice from receiving this medication? Side effects that you should report to your care team as soon as possible: Allergic reactions--skin rash, itching, hives, swelling of the face, lips, tongue, or throat Low blood pressure--dizziness, feeling faint or lightheaded, blurry vision Shortness of breath Side effects that usually do not require medical attention (report to your care team if they continue or are bothersome): Flushing Headache Joint pain Muscle pain Nausea Pain, redness, or irritation at injection site This list may not describe all possible side effects. Call your doctor for medical advice about side effects. You may report side effects to FDA at 1-800-FDA-1088. Where should I keep my medication? This medication is given in a hospital or clinic. It will not be stored at home. NOTE: This sheet is a summary. It may not cover all possible information. If you have questions about this medicine, talk to your doctor, pharmacist, or health care provider.  2024 Elsevier/Gold Standard (2022-07-16 00:00:00)

## 2022-10-28 NOTE — Progress Notes (Signed)
[facility]  Telephone:([phone]) [phone] Fax:(336) [phone]  ID: Randy Mclean Younger OB: 11-Jul-1960  MR#: [id number]  AOZ#:[id number]  Patient Care Team: Sallyanne Kuster, NP as PCP - General (Nurse Practitioner) Lyndon Code, MD (Internal Medicine) Jeralyn Ruths, MD as Consulting Physician (Oncology)  CHIEF COMPLAINT: Iron deficiency anemia.  INTERVAL HISTORY: Patient returns to clinic today for further evaluation and consideration of additional IV Venofer.  Recently, he has felt increasing weakness and fatigue, dizziness, and reports passing out 1 or 2 times.  He also reports intermittent active GI bleed and is awaiting to hear back from gastroenterology about an appointment.  He has no neurologic complaints.  He denies any recent fevers or illnesses.  He has no chest pain, shortness of breath, cough, or hemoptysis.  He denies any nausea, vomiting, constipation, or diarrhea.  He has no urinary complaints.  Patient feels generally terrible, but offers no further specific complaints today.  REVIEW OF SYSTEMS:   Review of Systems  Constitutional: Negative.  Negative for fever, malaise/fatigue and weight loss.  Respiratory: Negative.  Negative for cough and shortness of breath.   Cardiovascular: Negative.  Negative for chest pain and leg swelling.  Gastrointestinal: Negative.  Negative for abdominal pain, blood in stool and melena.  Genitourinary: Negative.  Negative for hematuria.  Musculoskeletal: Negative.  Negative for back pain and neck pain.  Skin: Negative.  Negative for rash.  Neurological: Negative.  Negative for dizziness, sensory change, focal weakness, weakness and headaches.  Psychiatric/Behavioral: Negative.  The patient is not nervous/anxious.     As per HPI. Otherwise, a complete review of systems is negative.  PAST MEDICAL HISTORY: Past Medical History:  Diagnosis Date   Acute appendicitis 12/04/2017   Allergy    takes allergy shots    Anemia    Arthritis    Asthma    Barrett esophagus    Bronchitis    Chronic pain    Colon polyp    Depression    GERD (gastroesophageal reflux disease)    Hemorrhoid    Hyperlipidemia    Hypertension    Hypothyroid    IBS (irritable bowel syndrome)    Kidney stone    MI (myocardial infarction) (HCC)    between 2002 and 2006   Migraine    Pneumonia    Seizures (HCC)    Sinus problem    Spinal cord stimulator status     PAST SURGICAL HISTORY: Past Surgical History:  Procedure Laterality Date   ANTERIOR CERVICAL DISCECTOMY     APPENDECTOMY     BACK SURGERY     BACK SURGERY  12/04/2020   COLONOSCOPY  09/2013   COLONOSCOPY N/A 05/09/2022   Procedure: COLONOSCOPY;  Surgeon: Jaynie Collins, DO;  Location: [facility] ENDOSCOPY;  Service: Gastroenterology;  Laterality: N/A;   elbow surgery     ESOPHAGOGASTRODUODENOSCOPY (EGD) WITH PROPOFOL N/A 05/09/2022   Procedure: ESOPHAGOGASTRODUODENOSCOPY (EGD) WITH PROPOFOL;  Surgeon: Jaynie Collins, DO;  Location: [facility] ENDOSCOPY;  Service: Gastroenterology;  Laterality: N/A;   GIVENS CAPSULE STUDY N/A 05/09/2022   Procedure: GIVENS CAPSULE STUDY;  Surgeon: Jaynie Collins, DO;  Location: [facility] ENDOSCOPY;  Service: Gastroenterology;  Laterality: N/A;   HEMORRHOID SURGERY     LAPAROSCOPIC APPENDECTOMY N/A 12/04/2017   Procedure: APPENDECTOMY LAPAROSCOPIC;  Surgeon: Ancil Linsey, MD;  Location: [facility] ORS;  Service: General;  Laterality: N/A;   LIPOMA EXCISION Right    leg   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  SPINAL CORD STIMULATOR IMPLANT     UPPER GI ENDOSCOPY  09/2013   WRIST SURGERY Left 01/14/2021    FAMILY HISTORY Family History  Problem Relation Age of Onset   Hypertension Mother    Lung disease Mother    Heart attack Father    Diabetes Father    Liver cancer Sister    Lung cancer Sister    Colon cancer Sister    Uterine cancer Sister        ADVANCED DIRECTIVES:    HEALTH  MAINTENANCE: Social History   Tobacco Use   Smoking status: Former    Current packs/day: 0.00    Average packs/day: 1 pack/day for 30.0 years (30.0 ttl pk-yrs)    Types: Cigarettes    Start date: 02/23/1975    Quit date: 02/22/2005    Years since quitting: 17.6   Smokeless tobacco: Never  Vaping Use   Vaping status: Never Used  Substance Use Topics   Alcohol use: Yes    Alcohol/week: 0.0 standard drinks of alcohol    Comment: occasionally   Drug use: No    Comment: past     Colonoscopy:  PAP:  Bone density:  Lipid panel:  Allergies  Allergen Reactions   Amoxicillin Anaphylaxis    REACTION: unspecified   Penicillin G Anaphylaxis   Gabapentin Other (See Comments)    REACTION: anaphylaxis    Current Outpatient Medications  Medication Sig Dispense Refill   alfuzosin (UROXATRAL) 10 MG 24 hr tablet TAKE 1 TABLET BY MOUTH EVERYDAY AT BEDTIME 90 tablet 1   ALPRAZolam (XANAX) 0.5 MG tablet Take 1 tablet (0.5 mg total) by mouth 3 (three) times daily as needed for anxiety. 90 tablet 2   amLODipine (NORVASC) 10 MG tablet TAKE 1 TABLET BY MOUTH EVERY DAY 90 tablet 1   atorvastatin (LIPITOR) 10 MG tablet TAKE 1 TABLET BY MOUTH EVERY DAY 90 tablet 1   buPROPion (WELLBUTRIN XL) 300 MG 24 hr tablet TAKE 1 TABLET BY MOUTH DAILY, GENERIC EQUIVALENT FOR WELLBUTRIN XL. DUE FOR OFFICE VISIT. 90 tablet 1   Dietary Management Product (VB6 P5P PO) Take by mouth.     esomeprazole (NEXIUM) 40 MG capsule TAKE 1 CAPSULE (40 MG TOTAL) BY MOUTH 2 (TWO) TIMES DAILY BEFORE A MEAL. 180 capsule 1   ferrous sulfate 325 (65 FE) MG EC tablet Take 1 tablet (325 mg total) by mouth every other day. 45 tablet 1   hydrochlorothiazide (HYDRODIURIL) 12.5 MG tablet TAKE 1 TABLET BY MOUTH EVERY DAY 90 tablet 1   levothyroxine (SYNTHROID) 150 MCG tablet TAKE 1 TABLET BY MOUTH EVERY DAY BEFORE BREAKFAST 90 tablet 1   methocarbamol (ROBAXIN) 750 MG tablet Take 750 mg by mouth 4 (four) times daily as needed.     modafinil  (PROVIGIL) 200 MG tablet Take 1 tablet (200 mg total) by mouth daily. 30 tablet 2   mometasone-formoterol (DULERA) 100-5 MCG/ACT AERO INHALE 2 PUFFS BY MOUTH INTO THE LUNGS EVERY 12 HOURS 39 g 3   Multiple Vitamin (MULTI-VITAMINS) TABS Take 1 tablet by mouth daily.     predniSONE (DELTASONE) 10 MG tablet Take one tab 3 x day for 3 days, then take one tab 2 x a day for 3 days and then take one tab a day for 3 days for copd 18 tablet 0   pregabalin (LYRICA) 100 MG capsule Take 1 capsule (100 mg total) by mouth every 8 (eight) hours. 10 capsule 0   promethazine-dextromethorphan (PROMETHAZINE-DM) 6.25-15 MG/5ML syrup Take  5 mLs by mouth 4 (four) times daily as needed for cough. 180 mL 0   SYRINGE-NEEDLE, DISP, 3 ML 22G X 1" 3 ML MISC Use as directed.     tadalafil (CIALIS) 20 MG tablet Take 0.5-1 tablets (10-20 mg total) by mouth daily as needed for erectile dysfunction. 10 tablet 5   testosterone cypionate (DEPOTESTOSTERONE CYPIONATE) 200 MG/ML injection INJECT 0.75ML ONCE A WEEK AS DIRECTED 10 mL 0   tiZANidine (ZANAFLEX) 4 MG tablet Take 4 mg by mouth every 6 (six) hours as needed for muscle spasms. Pt takes one tablet at night     valsartan (DIOVAN) 80 MG tablet Take by mouth.     No current facility-administered medications for this visit.    OBJECTIVE: Vitals:   10/28/22 0831  BP: 105/63  Pulse: 85  Resp: 16  Temp: 98.5 F (36.9 C)  SpO2: 100%        Body mass index is 29.84 kg/m.    ECOG FS:0 - Asymptomatic  General: Well-developed, well-nourished, no acute distress. Eyes: Pink conjunctiva, anicteric sclera. HEENT: Normocephalic, moist mucous membranes. Lungs: No audible wheezing or coughing. Heart: Regular rate and rhythm. Abdomen: Soft, nontender, no obvious distention. Musculoskeletal: No edema, cyanosis, or clubbing. Neuro: Alert, answering all questions appropriately. Cranial nerves grossly intact. Skin: No rashes or petechiae noted. Psych: Normal affect.  LAB  RESULTS:  Lab Results  Component Value Date   NA 137 10/18/2022   K 3.5 10/18/2022   CL 102 10/18/2022   CO2 28 10/18/2022   GLUCOSE 97 10/18/2022   BUN 9 10/18/2022   CREATININE 1.14 10/18/2022   CALCIUM 8.8 (L) 10/18/2022   PROT 7.0 10/18/2022   ALBUMIN 4.1 10/18/2022   AST 18 10/18/2022   ALT 20 10/18/2022   ALKPHOS 64 10/18/2022   BILITOT 0.4 10/18/2022   GFRNONAA >60 10/18/2022   GFRAA >60 05/05/2019    Lab Results  Component Value Date   WBC 7.1 10/18/2022   NEUTROABS 5.4 10/18/2022   HGB 7.7 (L) 10/18/2022   HCT 26.8 (L) 10/18/2022   MCV 79.5 (L) 10/18/2022   PLT 255 10/18/2022   Lab Results  Component Value Date   IRON 8 (L) 10/18/2022   TIBC 388 10/18/2022   IRONPCTSAT 2 (L) 10/18/2022   Lab Results  Component Value Date   FERRITIN 6 (L) 10/18/2022     STUDIES: No results found.  ASSESSMENT: Iron deficiency anemia.  PLAN:    Iron deficiency anemia: EGD and colonoscopy on May 09, 2022 did not reveal any significant pathology.  Patient also had capsule endoscopy, but these results are not available at this time.  Patient reports he is actively bleeding and is waiting for a appointment with gastroenterology.  His hemoglobin iron stores have significantly dropped and he is symptomatic.  Proceed with IV Venofer today.  Patient will return to clinic tomorrow for 1 unit packed red blood cells.  He will then return to clinic in 2 weeks with repeat laboratory work, further evaluation, and then additional iron if necessary.    GI bleed: Continue follow-up with GI as above.   Family history of colon cancer:  Patient gets routine colonoscopies given his family history of colon cancer, all of which been unrevealing.  Unclear when his last colonoscopy was completed.  His genetic testing is negative.  I spent a total of 30 minutes reviewing chart data, face-to-face evaluation with the patient, counseling and coordination of care as detailed above.    Patient  expressed understanding and was in agreement with this plan. He also understands that He can call clinic at any time with any questions, concerns, or complaints.    Jeralyn Ruths, MD   10/28/2022 9:01 AM

## 2022-10-28 NOTE — Progress Notes (Signed)
Having some dizziness that has been going on for a couple. Feels the same way he did before he went into the hospital in 05/03/2022. Has passed out 2 times since his last visit. States he is passing blood in stool.

## 2022-10-29 ENCOUNTER — Inpatient Hospital Stay: Payer: No Typology Code available for payment source

## 2022-10-29 DIAGNOSIS — D509 Iron deficiency anemia, unspecified: Secondary | ICD-10-CM | POA: Diagnosis not present

## 2022-10-29 MED ORDER — ACETAMINOPHEN 325 MG PO TABS
650.0000 mg | ORAL_TABLET | Freq: Once | ORAL | Status: AC
  Administered 2022-10-29: 650 mg via ORAL
  Filled 2022-10-29: qty 2

## 2022-10-29 MED ORDER — DIPHENHYDRAMINE HCL 50 MG/ML IJ SOLN
25.0000 mg | Freq: Once | INTRAMUSCULAR | Status: AC
  Administered 2022-10-29: 25 mg via INTRAVENOUS
  Filled 2022-10-29: qty 1

## 2022-10-29 MED ORDER — SODIUM CHLORIDE 0.9% IV SOLUTION
250.0000 mL | Freq: Once | INTRAVENOUS | Status: AC
  Administered 2022-10-29: 250 mL via INTRAVENOUS
  Filled 2022-10-29: qty 250

## 2022-10-29 NOTE — Patient Instructions (Signed)

## 2022-10-30 LAB — TYPE AND SCREEN
ABO/RH(D): O POS
Antibody Screen: NEGATIVE
Unit division: 0
Unit division: 0

## 2022-10-30 LAB — BPAM RBC
Blood Product Expiration Date: 202410062359
Blood Product Expiration Date: 202410062359
ISSUE DATE / TIME: 202409060835
ISSUE DATE / TIME: 202409061043
Unit Type and Rh: 5100
Unit Type and Rh: 5100

## 2022-11-04 ENCOUNTER — Ambulatory Visit: Payer: No Typology Code available for payment source | Admitting: Nurse Practitioner

## 2022-11-04 ENCOUNTER — Encounter: Payer: Self-pay | Admitting: Nurse Practitioner

## 2022-11-04 ENCOUNTER — Encounter: Payer: Self-pay | Admitting: Oncology

## 2022-11-04 VITALS — BP 130/80 | HR 78 | Temp 98.7°F | Resp 16 | Ht 72.0 in | Wt 216.6 lb

## 2022-11-04 DIAGNOSIS — J432 Centrilobular emphysema: Secondary | ICD-10-CM | POA: Diagnosis not present

## 2022-11-04 DIAGNOSIS — R053 Chronic cough: Secondary | ICD-10-CM

## 2022-11-04 DIAGNOSIS — D649 Anemia, unspecified: Secondary | ICD-10-CM | POA: Diagnosis not present

## 2022-11-04 NOTE — Progress Notes (Signed)
[facility] [address] [place], [place] [address]  Internal MEDICINE  Office Visit Note  Patient Name: Randy Mclean  [id number]  [id number]  Date of Service: 11/04/2022  Chief Complaint  Patient presents with   Follow-up    HPI Randy Mclean presents for a follow-up visit for PFT results and cough PFT normal with 2-3 % improvement with bronchodilator Still having cough, chronic-- have done workup to rule out multiple causes but may also be multifactorial. Recent blood transfusion at f/u with hem/onc due to hgb dropping to 6.4, they recommended seeing GI to eval for a bleed Need to call GI for appt   Current Medication: Outpatient Encounter Medications as of 11/04/2022  Medication Sig   alfuzosin (UROXATRAL) 10 MG 24 hr tablet TAKE 1 TABLET BY MOUTH EVERYDAY AT BEDTIME   ALPRAZolam (XANAX) 0.5 MG tablet Take 1 tablet (0.5 mg total) by mouth 3 (three) times daily as needed for anxiety.   amLODipine (NORVASC) 10 MG tablet TAKE 1 TABLET BY MOUTH EVERY DAY   atorvastatin (LIPITOR) 10 MG tablet TAKE 1 TABLET BY MOUTH EVERY DAY   buPROPion (WELLBUTRIN XL) 300 MG 24 hr tablet TAKE 1 TABLET BY MOUTH DAILY, GENERIC EQUIVALENT FOR WELLBUTRIN XL. DUE FOR OFFICE VISIT.   Dietary Management Product (VB6 P5P PO) Take by mouth.   esomeprazole (NEXIUM) 40 MG capsule TAKE 1 CAPSULE (40 MG TOTAL) BY MOUTH 2 (TWO) TIMES DAILY BEFORE A MEAL.   ferrous sulfate 325 (65 FE) MG EC tablet Take 1 tablet (325 mg total) by mouth every other day.   hydrochlorothiazide (HYDRODIURIL) 12.5 MG tablet TAKE 1 TABLET BY MOUTH EVERY DAY   levothyroxine (SYNTHROID) 150 MCG tablet TAKE 1 TABLET BY MOUTH EVERY DAY BEFORE BREAKFAST   methocarbamol (ROBAXIN) 750 MG tablet Take 750 mg by mouth 4 (four) times daily as needed.   modafinil (PROVIGIL) 200 MG tablet Take 1 tablet (200 mg total) by mouth daily.   mometasone-formoterol (DULERA) 100-5 MCG/ACT AERO INHALE 2 PUFFS BY MOUTH INTO THE LUNGS EVERY 12  HOURS   Multiple Vitamin (MULTI-VITAMINS) TABS Take 1 tablet by mouth daily.   pregabalin (LYRICA) 100 MG capsule Take 1 capsule (100 mg total) by mouth every 8 (eight) hours.   SYRINGE-NEEDLE, DISP, 3 ML 22G X 1" 3 ML MISC Use as directed.   tadalafil (CIALIS) 20 MG tablet Take 0.5-1 tablets (10-20 mg total) by mouth daily as needed for erectile dysfunction.   testosterone cypionate (DEPOTESTOSTERONE CYPIONATE) 200 MG/ML injection INJECT 0.75ML ONCE A WEEK AS DIRECTED   tiZANidine (ZANAFLEX) 4 MG tablet Take 4 mg by mouth every 6 (six) hours as needed for muscle spasms. Pt takes one tablet at night   valsartan (DIOVAN) 80 MG tablet Take by mouth.   [DISCONTINUED] predniSONE (DELTASONE) 10 MG tablet Take one tab 3 x day for 3 days, then take one tab 2 x a day for 3 days and then take one tab a day for 3 days for copd   [DISCONTINUED] promethazine-dextromethorphan (PROMETHAZINE-DM) 6.25-15 MG/5ML syrup Take 5 mLs by mouth 4 (four) times daily as needed for cough.   No facility-administered encounter medications on file as of 11/04/2022.    Surgical History: Past Surgical History:  Procedure Laterality Date   ANTERIOR CERVICAL DISCECTOMY     APPENDECTOMY     BACK SURGERY     BACK SURGERY  12/04/2020   COLONOSCOPY  09/2013   COLONOSCOPY N/A 05/09/2022   Procedure: COLONOSCOPY;  Surgeon: Elfredia Nevins  Casimiro Needle, DO;  Location: [facility] ENDOSCOPY;  Service: Gastroenterology;  Laterality: N/A;   elbow surgery     ESOPHAGOGASTRODUODENOSCOPY (EGD) WITH PROPOFOL N/A 05/09/2022   Procedure: ESOPHAGOGASTRODUODENOSCOPY (EGD) WITH PROPOFOL;  Surgeon: Jaynie Collins, DO;  Location: [facility] ENDOSCOPY;  Service: Gastroenterology;  Laterality: N/A;   GIVENS CAPSULE STUDY N/A 05/09/2022   Procedure: GIVENS CAPSULE STUDY;  Surgeon: Jaynie Collins, DO;  Location: [facility] ENDOSCOPY;  Service: Gastroenterology;  Laterality: N/A;   HEMORRHOID SURGERY     LAPAROSCOPIC APPENDECTOMY N/A 12/04/2017   Procedure:  APPENDECTOMY LAPAROSCOPIC;  Surgeon: Ancil Linsey, MD;  Location: [facility] ORS;  Service: General;  Laterality: N/A;   LIPOMA EXCISION Right    leg   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     SPINAL CORD STIMULATOR IMPLANT     UPPER GI ENDOSCOPY  09/2013   WRIST SURGERY Left 01/14/2021    Medical History: Past Medical History:  Diagnosis Date   Acute appendicitis 12/04/2017   Allergy    takes allergy shots   Anemia    Arthritis    Asthma    Barrett esophagus    Bronchitis    Chronic pain    Colon polyp    Depression    GERD (gastroesophageal reflux disease)    Hemorrhoid    Hyperlipidemia    Hypertension    Hypothyroid    IBS (irritable bowel syndrome)    Kidney stone    MI (myocardial infarction) (HCC)    between 2002 and 2006   Migraine    Pneumonia    Seizures (HCC)    Sinus problem    Spinal cord stimulator status     Family History: Family History  Problem Relation Age of Onset   Hypertension Mother    Lung disease Mother    Heart attack Father    Diabetes Father    Liver cancer Sister    Lung cancer Sister    Colon cancer Sister    Uterine cancer Sister     Social History   Socioeconomic History   Marital status: Married    Spouse name: Not on file   Number of children: Not on file   Years of education: Not on file   Highest education level: Not on file  Occupational History   Not on file  Tobacco Use   Smoking status: Former    Current packs/day: 0.00    Average packs/day: 1 pack/day for 30.0 years (30.0 ttl pk-yrs)    Types: Cigarettes    Start date: 02/23/1975    Quit date: 02/22/2005    Years since quitting: 17.8   Smokeless tobacco: Never  Vaping Use   Vaping status: Never Used  Substance and Sexual Activity   Alcohol use: Yes    Alcohol/week: 0.0 standard drinks of alcohol    Comment: occasionally   Drug use: No    Comment: past   Sexual activity: Yes  Other Topics Concern   Not on file  Social History Narrative   Not on  file   Social Determinants of Health   Financial Resource Strain: Not on file  Food Insecurity: No Food Insecurity (05/07/2022)   Hunger Vital Sign    Worried About Running Out of Food in the Last Year: Never true    Ran Out of Food in the Last Year: Never true  Transportation Needs: No Transportation Needs (05/07/2022)   PRAPARE - Transportation    Lack of Transportation (Medical): No    Lack of  Transportation (Non-Medical): No  Physical Activity: Not on file  Stress: Not on file  Social Connections: Not on file  Intimate Partner Violence: Not At Risk (05/07/2022)   Humiliation, Afraid, Rape, and Kick questionnaire    Fear of Current or Ex-Partner: No    Emotionally Abused: No    Physically Abused: No    Sexually Abused: No      Review of Systems  Constitutional:  Positive for activity change, fatigue and fever. Negative for chills.  HENT:  Positive for postnasal drip. Negative for congestion, rhinorrhea, sinus pressure, sinus pain and sore throat.   Eyes:  Negative for pain.  Respiratory:  Positive for cough. Negative for chest tightness, shortness of breath and wheezing.   Cardiovascular: Negative.  Negative for chest pain and palpitations.  Gastrointestinal:  Negative for abdominal pain, constipation, diarrhea, nausea and vomiting.  Musculoskeletal: Negative.  Negative for myalgias.  Skin: Negative.   Neurological:  Positive for headaches.    Vital Signs: BP 130/80   Pulse 78   Temp 98.7 F (37.1 C)   Resp 16   Ht 6' (1.829 m)   Wt 216 lb 9.6 oz (98.2 kg)   SpO2 97%   BMI 29.38 kg/m    Physical Exam Vitals reviewed.  Constitutional:      General: He is not in acute distress.    Appearance: Normal appearance. He is ill-appearing.  HENT:     Head: Normocephalic and atraumatic.  Eyes:     Pupils: Pupils are equal, round, and reactive to light.  Cardiovascular:     Rate and Rhythm: Normal rate and regular rhythm.  Pulmonary:     Effort: Pulmonary effort is  normal. No respiratory distress.  Neurological:     Mental Status: He is alert and oriented to person, place, and time.  Psychiatric:        Mood and Affect: Mood normal.        Behavior: Behavior normal.        Assessment/Plan: 1. Chronic cough Emphysema may be a contributing factor.   2. Centrilobular emphysema (HCC) Stable, PFT ok, check in 1 year  3. Symptomatic anemia Need to reach to GI, may be due to a slow GI bleed.    General Counseling: deo latini understanding of the findings of todays visit and agrees with plan of treatment. I have discussed any further diagnostic evaluation that may be needed or ordered today. We also reviewed his medications today. he has been encouraged to call the office with any questions or concerns that should arise related to todays visit.    No orders of the defined types were placed in this encounter.   No orders of the defined types were placed in this encounter.   Return in about 2 months (around 01/04/2023) for F/U, Malique Driskill PCP.   Total time spent:30 Minutes Time spent includes review of chart, medications, test results, and follow up plan with the patient.   [place] Controlled Substance Database was reviewed by me.  This patient was seen by Sallyanne Kuster, FNP-C in collaboration with Dr. Beverely Risen as a part of collaborative care agreement.   Libia Fazzini R. Tedd Sias, MSN, FNP-C Internal medicine

## 2022-11-10 ENCOUNTER — Telehealth: Payer: Self-pay | Admitting: Nurse Practitioner

## 2022-11-10 NOTE — Telephone Encounter (Signed)
Emailed 08/02/22 receipt to patient-[name]

## 2022-11-11 ENCOUNTER — Other Ambulatory Visit: Payer: Self-pay | Admitting: *Deleted

## 2022-11-11 DIAGNOSIS — D509 Iron deficiency anemia, unspecified: Secondary | ICD-10-CM

## 2022-11-12 ENCOUNTER — Inpatient Hospital Stay: Payer: No Typology Code available for payment source

## 2022-11-12 DIAGNOSIS — D509 Iron deficiency anemia, unspecified: Secondary | ICD-10-CM | POA: Diagnosis not present

## 2022-11-12 LAB — CBC WITH DIFFERENTIAL/PLATELET
Abs Immature Granulocytes: 0.01 10*3/uL (ref 0.00–0.07)
Basophils Absolute: 0.1 10*3/uL (ref 0.0–0.1)
Basophils Relative: 1 %
Eosinophils Absolute: 0 10*3/uL (ref 0.0–0.5)
Eosinophils Relative: 1 %
HCT: 27.9 % — ABNORMAL LOW (ref 39.0–52.0)
Hemoglobin: 7.9 g/dL — ABNORMAL LOW (ref 13.0–17.0)
Immature Granulocytes: 0 %
Lymphocytes Relative: 19 %
Lymphs Abs: 1.1 10*3/uL (ref 0.7–4.0)
MCH: 22 pg — ABNORMAL LOW (ref 26.0–34.0)
MCHC: 28.3 g/dL — ABNORMAL LOW (ref 30.0–36.0)
MCV: 77.7 fL — ABNORMAL LOW (ref 80.0–100.0)
Monocytes Absolute: 0.8 10*3/uL (ref 0.1–1.0)
Monocytes Relative: 13 %
Neutro Abs: 4 10*3/uL (ref 1.7–7.7)
Neutrophils Relative %: 66 %
Platelets: 335 10*3/uL (ref 150–400)
RBC: 3.59 MIL/uL — ABNORMAL LOW (ref 4.22–5.81)
RDW: 17.1 % — ABNORMAL HIGH (ref 11.5–15.5)
WBC: 6 10*3/uL (ref 4.0–10.5)
nRBC: 0 % (ref 0.0–0.2)

## 2022-11-12 LAB — SAMPLE TO BLOOD BANK

## 2022-11-12 LAB — IRON AND TIBC
Iron: 12 ug/dL — ABNORMAL LOW (ref 45–182)
Saturation Ratios: 3 % — ABNORMAL LOW (ref 17.9–39.5)
TIBC: 412 ug/dL (ref 250–450)
UIBC: 400 ug/dL

## 2022-11-12 LAB — FERRITIN: Ferritin: 6 ng/mL — ABNORMAL LOW (ref 24–336)

## 2022-11-15 ENCOUNTER — Inpatient Hospital Stay (HOSPITAL_BASED_OUTPATIENT_CLINIC_OR_DEPARTMENT_OTHER): Payer: No Typology Code available for payment source | Admitting: Oncology

## 2022-11-15 ENCOUNTER — Encounter: Payer: Self-pay | Admitting: Oncology

## 2022-11-15 ENCOUNTER — Inpatient Hospital Stay: Payer: No Typology Code available for payment source

## 2022-11-15 VITALS — BP 124/47 | HR 84 | Temp 99.1°F | Resp 16 | Ht 72.0 in | Wt 214.0 lb

## 2022-11-15 VITALS — BP 138/69 | HR 74 | Resp 18

## 2022-11-15 DIAGNOSIS — D509 Iron deficiency anemia, unspecified: Secondary | ICD-10-CM | POA: Diagnosis not present

## 2022-11-15 DIAGNOSIS — D508 Other iron deficiency anemias: Secondary | ICD-10-CM

## 2022-11-15 MED ORDER — SODIUM CHLORIDE 0.9 % IV SOLN
INTRAVENOUS | Status: DC
  Filled 2022-11-15: qty 250

## 2022-11-15 MED ORDER — SODIUM CHLORIDE 0.9 % IV SOLN
200.0000 mg | Freq: Once | INTRAVENOUS | Status: AC
  Administered 2022-11-15: 200 mg via INTRAVENOUS
  Filled 2022-11-15: qty 200

## 2022-11-15 NOTE — Progress Notes (Unsigned)
[facility]  Telephone:(336) [phone] Fax:(336) [phone]  ID: Randy Mclean Younger OB: [birth year]  MR#: [id number]  ZSW#:[id number]  Patient Care Team: Sallyanne Kuster, NP as PCP - General (Nurse Practitioner) Lyndon Code, MD (Internal Medicine) Jeralyn Ruths, MD as Consulting Physician (Oncology)  CHIEF COMPLAINT: Iron deficiency anemia.  INTERVAL HISTORY: Patient returns to clinic today for further evaluation and consideration of additional IV Venofer.  Recently, he has felt increasing weakness and fatigue, dizziness, and reports passing out 1 or 2 times.  He also reports intermittent active GI bleed and is awaiting to hear back from gastroenterology about an appointment.  He has no neurologic complaints.  He denies any recent fevers or illnesses.  He has no chest pain, shortness of breath, cough, or hemoptysis.  He denies any nausea, vomiting, constipation, or diarrhea.  He has no urinary complaints.  Patient feels generally terrible, but offers no further specific complaints today.  REVIEW OF SYSTEMS:   Review of Systems  Constitutional: Negative.  Negative for fever, malaise/fatigue and weight loss.  Respiratory: Negative.  Negative for cough and shortness of breath.   Cardiovascular: Negative.  Negative for chest pain and leg swelling.  Gastrointestinal: Negative.  Negative for abdominal pain, blood in stool and melena.  Genitourinary: Negative.  Negative for hematuria.  Musculoskeletal: Negative.  Negative for back pain and neck pain.  Skin: Negative.  Negative for rash.  Neurological: Negative.  Negative for dizziness, sensory change, focal weakness, weakness and headaches.  Psychiatric/Behavioral: Negative.  The patient is not nervous/anxious.     As per HPI. Otherwise, a complete review of systems is negative.  PAST MEDICAL HISTORY: Past Medical History:  Diagnosis Date   Acute appendicitis 12/04/2017   Allergy    takes allergy shots    Anemia    Arthritis    Asthma    Barrett esophagus    Bronchitis    Chronic pain    Colon polyp    Depression    GERD (gastroesophageal reflux disease)    Hemorrhoid    Hyperlipidemia    Hypertension    Hypothyroid    IBS (irritable bowel syndrome)    Kidney stone    MI (myocardial infarction) (HCC)    between 2002 and 2006   Migraine    Pneumonia    Seizures (HCC)    Sinus problem    Spinal cord stimulator status     PAST SURGICAL HISTORY: Past Surgical History:  Procedure Laterality Date   ANTERIOR CERVICAL DISCECTOMY     APPENDECTOMY     BACK SURGERY     BACK SURGERY  12/04/2020   COLONOSCOPY  09/2013   COLONOSCOPY N/A 05/09/2022   Procedure: COLONOSCOPY;  Surgeon: Jaynie Collins, DO;  Location: [facility] ENDOSCOPY;  Service: Gastroenterology;  Laterality: N/A;   elbow surgery     ESOPHAGOGASTRODUODENOSCOPY (EGD) WITH PROPOFOL N/A 05/09/2022   Procedure: ESOPHAGOGASTRODUODENOSCOPY (EGD) WITH PROPOFOL;  Surgeon: Jaynie Collins, DO;  Location: [facility] ENDOSCOPY;  Service: Gastroenterology;  Laterality: N/A;   GIVENS CAPSULE STUDY N/A 05/09/2022   Procedure: GIVENS CAPSULE STUDY;  Surgeon: Jaynie Collins, DO;  Location: [facility] ENDOSCOPY;  Service: Gastroenterology;  Laterality: N/A;   HEMORRHOID SURGERY     LAPAROSCOPIC APPENDECTOMY N/A 12/04/2017   Procedure: APPENDECTOMY LAPAROSCOPIC;  Surgeon: Ancil Linsey, MD;  Location: [facility] ORS;  Service: General;  Laterality: N/A;   LIPOMA EXCISION Right    leg   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  SPINAL CORD STIMULATOR IMPLANT     UPPER GI ENDOSCOPY  09/2013   WRIST SURGERY Left 01/14/2021    FAMILY HISTORY Family History  Problem Relation Age of Onset   Hypertension Mother    Lung disease Mother    Heart attack Father    Diabetes Father    Liver cancer Sister    Lung cancer Sister    Colon cancer Sister    Uterine cancer Sister        ADVANCED DIRECTIVES:    HEALTH  MAINTENANCE: Social History   Tobacco Use   Smoking status: Former    Current packs/day: 0.00    Average packs/day: 1 pack/day for 30.0 years (30.0 ttl pk-yrs)    Types: Cigarettes    Start date: 02/23/1975    Quit date: 02/22/2005    Years since quitting: 17.7   Smokeless tobacco: Never  Vaping Use   Vaping status: Never Used  Substance Use Topics   Alcohol use: Yes    Alcohol/week: 0.0 standard drinks of alcohol    Comment: occasionally   Drug use: No    Comment: past     Colonoscopy:  PAP:  Bone density:  Lipid panel:  Allergies  Allergen Reactions   Amoxicillin Anaphylaxis    REACTION: unspecified   Penicillin G Anaphylaxis   Gabapentin Other (See Comments)    REACTION: anaphylaxis    Current Outpatient Medications  Medication Sig Dispense Refill   alfuzosin (UROXATRAL) 10 MG 24 hr tablet TAKE 1 TABLET BY MOUTH EVERYDAY AT BEDTIME 90 tablet 1   ALPRAZolam (XANAX) 0.5 MG tablet Take 1 tablet (0.5 mg total) by mouth 3 (three) times daily as needed for anxiety. 90 tablet 2   amLODipine (NORVASC) 10 MG tablet TAKE 1 TABLET BY MOUTH EVERY DAY 90 tablet 1   atorvastatin (LIPITOR) 10 MG tablet TAKE 1 TABLET BY MOUTH EVERY DAY 90 tablet 1   buPROPion (WELLBUTRIN XL) 300 MG 24 hr tablet TAKE 1 TABLET BY MOUTH DAILY, GENERIC EQUIVALENT FOR WELLBUTRIN XL. DUE FOR OFFICE VISIT. 90 tablet 1   Dietary Management Product (VB6 P5P PO) Take by mouth.     esomeprazole (NEXIUM) 40 MG capsule TAKE 1 CAPSULE (40 MG TOTAL) BY MOUTH 2 (TWO) TIMES DAILY BEFORE A MEAL. 180 capsule 1   ferrous sulfate 325 (65 FE) MG EC tablet Take 1 tablet (325 mg total) by mouth every other day. 45 tablet 1   hydrochlorothiazide (HYDRODIURIL) 12.5 MG tablet TAKE 1 TABLET BY MOUTH EVERY DAY 90 tablet 1   levothyroxine (SYNTHROID) 150 MCG tablet TAKE 1 TABLET BY MOUTH EVERY DAY BEFORE BREAKFAST 90 tablet 1   methocarbamol (ROBAXIN) 750 MG tablet Take 750 mg by mouth 4 (four) times daily as needed.     modafinil  (PROVIGIL) 200 MG tablet Take 1 tablet (200 mg total) by mouth daily. 30 tablet 2   mometasone-formoterol (DULERA) 100-5 MCG/ACT AERO INHALE 2 PUFFS BY MOUTH INTO THE LUNGS EVERY 12 HOURS 39 g 3   Multiple Vitamin (MULTI-VITAMINS) TABS Take 1 tablet by mouth daily.     pregabalin (LYRICA) 100 MG capsule Take 1 capsule (100 mg total) by mouth every 8 (eight) hours. 10 capsule 0   promethazine-dextromethorphan (PROMETHAZINE-DM) 6.25-15 MG/5ML syrup Take 5 mLs by mouth 4 (four) times daily as needed for cough. 180 mL 0   SYRINGE-NEEDLE, DISP, 3 ML 22G X 1" 3 ML MISC Use as directed.     tadalafil (CIALIS) 20 MG tablet Take 0.5-1 tablets (  10-20 mg total) by mouth daily as needed for erectile dysfunction. 10 tablet 5   testosterone cypionate (DEPOTESTOSTERONE CYPIONATE) 200 MG/ML injection INJECT 0.75ML ONCE A WEEK AS DIRECTED 10 mL 0   tiZANidine (ZANAFLEX) 4 MG tablet Take 4 mg by mouth every 6 (six) hours as needed for muscle spasms. Pt takes one tablet at night     valsartan (DIOVAN) 80 MG tablet Take by mouth.     No current facility-administered medications for this visit.    OBJECTIVE: Vitals:   11/15/22 1356  BP: (!) 124/47  Pulse: 84  Resp: 16  Temp: 99.1 F (37.3 C)  SpO2: 97%        Body mass index is 29.02 kg/m.    ECOG FS:0 - Asymptomatic  General: Well-developed, well-nourished, no acute distress. Eyes: Pink conjunctiva, anicteric sclera. HEENT: Normocephalic, moist mucous membranes. Lungs: No audible wheezing or coughing. Heart: Regular rate and rhythm. Abdomen: Soft, nontender, no obvious distention. Musculoskeletal: No edema, cyanosis, or clubbing. Neuro: Alert, answering all questions appropriately. Cranial nerves grossly intact. Skin: No rashes or petechiae noted. Psych: Normal affect.  LAB RESULTS:  Lab Results  Component Value Date   NA 137 10/18/2022   K 3.5 10/18/2022   CL 102 10/18/2022   CO2 28 10/18/2022   GLUCOSE 97 10/18/2022   BUN 9 10/18/2022    CREATININE 1.14 10/18/2022   CALCIUM 8.8 (L) 10/18/2022   PROT 7.0 10/18/2022   ALBUMIN 4.1 10/18/2022   AST 18 10/18/2022   ALT 20 10/18/2022   ALKPHOS 64 10/18/2022   BILITOT 0.4 10/18/2022   GFRNONAA >60 10/18/2022   GFRAA >60 05/05/2019    Lab Results  Component Value Date   WBC 6.0 11/12/2022   NEUTROABS 4.0 11/12/2022   HGB 7.9 (L) 11/12/2022   HCT 27.9 (L) 11/12/2022   MCV 77.7 (L) 11/12/2022   PLT 335 11/12/2022   Lab Results  Component Value Date   IRON 12 (L) 11/12/2022   TIBC 412 11/12/2022   IRONPCTSAT 3 (L) 11/12/2022   Lab Results  Component Value Date   FERRITIN 6 (L) 11/12/2022     STUDIES: No results found.  ASSESSMENT: Iron deficiency anemia.  PLAN:    Iron deficiency anemia: EGD and colonoscopy on May 09, 2022 did not reveal any significant pathology.  Patient also had capsule endoscopy, but these results are not available at this time.  Patient reports he is actively bleeding and is waiting for a appointment with gastroenterology.  His hemoglobin iron stores have significantly dropped and he is symptomatic.  Proceed with IV Venofer today.  Patient will return to clinic tomorrow for 1 unit packed red blood cells.  He will then return to clinic in 2 weeks with repeat laboratory work, further evaluation, and then additional iron if necessary.    GI bleed: Continue follow-up with GI as above.   Family history of colon cancer:  Patient gets routine colonoscopies given his family history of colon cancer, all of which been unrevealing.  Unclear when his last colonoscopy was completed.  His genetic testing is negative.  I spent a total of 30 minutes reviewing chart data, face-to-face evaluation with the patient, counseling and coordination of care as detailed above.    Patient expressed understanding and was in agreement with this plan. He also understands that He can call clinic at any time with any questions, concerns, or complaints.    Jeralyn Ruths, MD   11/15/2022 2:32 PM

## 2022-11-15 NOTE — Patient Instructions (Signed)
Iron Sucrose Injection What is this medication? IRON SUCROSE (EYE ern SOO krose) treats low levels of iron (iron deficiency anemia) in people with kidney disease. Iron is a mineral that plays an important role in making red blood cells, which carry oxygen from your lungs to the rest of your body. This medicine may be used for other purposes; ask your health care provider or pharmacist if you have questions. COMMON BRAND NAME(S): Venofer What should I tell my care team before I take this medication? They need to know if you have any of these conditions: Anemia not caused by low iron levels Heart disease High levels of iron in the blood Kidney disease Liver disease An unusual or allergic reaction to iron, other medications, foods, dyes, or preservatives Pregnant or trying to get pregnant Breastfeeding How should I use this medication? This medication is for infusion into a vein. It is given in a hospital or clinic setting. Talk to your care team about the use of this medication in children. While this medication may be prescribed for children as young as 2 years for selected conditions, precautions do apply. Overdosage: If you think you have taken too much of this medicine contact a poison control center or emergency room at once. NOTE: This medicine is only for you. Do not share this medicine with others. What if I miss a dose? Keep appointments for follow-up doses. It is important not to miss your dose. Call your care team if you are unable to keep an appointment. What may interact with this medication? Do not take this medication with any of the following: Deferoxamine Dimercaprol Other iron products This medication may also interact with the following: Chloramphenicol Deferasirox This list may not describe all possible interactions. Give your health care provider a list of all the medicines, herbs, non-prescription drugs, or dietary supplements you use. Also tell them if you smoke,  drink alcohol, or use illegal drugs. Some items may interact with your medicine. What should I watch for while using this medication? Visit your care team regularly. Tell your care team if your symptoms do not start to get better or if they get worse. You may need blood work done while you are taking this medication. You may need to follow a special diet. Talk to your care team. Foods that contain iron include: whole grains/cereals, dried fruits, beans, or peas, leafy green vegetables, and organ meats (liver, kidney). What side effects may I notice from receiving this medication? Side effects that you should report to your care team as soon as possible: Allergic reactions--skin rash, itching, hives, swelling of the face, lips, tongue, or throat Low blood pressure--dizziness, feeling faint or lightheaded, blurry vision Shortness of breath Side effects that usually do not require medical attention (report to your care team if they continue or are bothersome): Flushing Headache Joint pain Muscle pain Nausea Pain, redness, or irritation at injection site This list may not describe all possible side effects. Call your doctor for medical advice about side effects. You may report side effects to FDA at 1-800-FDA-1088. Where should I keep my medication? This medication is given in a hospital or clinic. It will not be stored at home. NOTE: This sheet is a summary. It may not cover all possible information. If you have questions about this medicine, talk to your doctor, pharmacist, or health care provider.  2024 Elsevier/Gold Standard (2022-07-16 00:00:00)

## 2022-11-16 ENCOUNTER — Encounter: Payer: Self-pay | Admitting: Oncology

## 2022-11-18 ENCOUNTER — Inpatient Hospital Stay: Payer: No Typology Code available for payment source

## 2022-11-22 ENCOUNTER — Inpatient Hospital Stay: Payer: No Typology Code available for payment source

## 2022-11-22 VITALS — BP 132/69 | HR 91 | Temp 98.1°F | Resp 18

## 2022-11-22 DIAGNOSIS — D508 Other iron deficiency anemias: Secondary | ICD-10-CM

## 2022-11-22 DIAGNOSIS — D509 Iron deficiency anemia, unspecified: Secondary | ICD-10-CM | POA: Diagnosis not present

## 2022-11-22 MED ORDER — SODIUM CHLORIDE 0.9 % IV SOLN
INTRAVENOUS | Status: DC
  Filled 2022-11-22: qty 250

## 2022-11-22 MED ORDER — SODIUM CHLORIDE 0.9 % IV SOLN
200.0000 mg | Freq: Once | INTRAVENOUS | Status: AC
  Administered 2022-11-22: 200 mg via INTRAVENOUS
  Filled 2022-11-22: qty 200

## 2022-11-22 NOTE — Patient Instructions (Signed)
Iron Sucrose Injection What is this medication? IRON SUCROSE (EYE ern SOO krose) treats low levels of iron (iron deficiency anemia) in people with kidney disease. Iron is a mineral that plays an important role in making red blood cells, which carry oxygen from your lungs to the rest of your body. This medicine may be used for other purposes; ask your health care provider or pharmacist if you have questions. COMMON BRAND NAME(S): Venofer What should I tell my care team before I take this medication? They need to know if you have any of these conditions: Anemia not caused by low iron levels Heart disease High levels of iron in the blood Kidney disease Liver disease An unusual or allergic reaction to iron, other medications, foods, dyes, or preservatives Pregnant or trying to get pregnant Breastfeeding How should I use this medication? This medication is for infusion into a vein. It is given in a hospital or clinic setting. Talk to your care team about the use of this medication in children. While this medication may be prescribed for children as young as 2 years for selected conditions, precautions do apply. Overdosage: If you think you have taken too much of this medicine contact a poison control center or emergency room at once. NOTE: This medicine is only for you. Do not share this medicine with others. What if I miss a dose? Keep appointments for follow-up doses. It is important not to miss your dose. Call your care team if you are unable to keep an appointment. What may interact with this medication? Do not take this medication with any of the following: Deferoxamine Dimercaprol Other iron products This medication may also interact with the following: Chloramphenicol Deferasirox This list may not describe all possible interactions. Give your health care provider a list of all the medicines, herbs, non-prescription drugs, or dietary supplements you use. Also tell them if you smoke,  drink alcohol, or use illegal drugs. Some items may interact with your medicine. What should I watch for while using this medication? Visit your care team regularly. Tell your care team if your symptoms do not start to get better or if they get worse. You may need blood work done while you are taking this medication. You may need to follow a special diet. Talk to your care team. Foods that contain iron include: whole grains/cereals, dried fruits, beans, or peas, leafy green vegetables, and organ meats (liver, kidney). What side effects may I notice from receiving this medication? Side effects that you should report to your care team as soon as possible: Allergic reactions--skin rash, itching, hives, swelling of the face, lips, tongue, or throat Low blood pressure--dizziness, feeling faint or lightheaded, blurry vision Shortness of breath Side effects that usually do not require medical attention (report to your care team if they continue or are bothersome): Flushing Headache Joint pain Muscle pain Nausea Pain, redness, or irritation at injection site This list may not describe all possible side effects. Call your doctor for medical advice about side effects. You may report side effects to FDA at 1-800-FDA-1088. Where should I keep my medication? This medication is given in a hospital or clinic. It will not be stored at home. NOTE: This sheet is a summary. It may not cover all possible information. If you have questions about this medicine, talk to your doctor, pharmacist, or health care provider.  2024 Elsevier/Gold Standard (2022-07-16 00:00:00)

## 2022-11-25 ENCOUNTER — Inpatient Hospital Stay: Payer: No Typology Code available for payment source | Attending: Oncology

## 2022-11-25 VITALS — BP 125/68 | HR 83 | Temp 99.1°F | Resp 19

## 2022-11-25 DIAGNOSIS — Z8249 Family history of ischemic heart disease and other diseases of the circulatory system: Secondary | ICD-10-CM | POA: Insufficient documentation

## 2022-11-25 DIAGNOSIS — I10 Essential (primary) hypertension: Secondary | ICD-10-CM | POA: Diagnosis not present

## 2022-11-25 DIAGNOSIS — K921 Melena: Secondary | ICD-10-CM | POA: Diagnosis not present

## 2022-11-25 DIAGNOSIS — E039 Hypothyroidism, unspecified: Secondary | ICD-10-CM | POA: Diagnosis not present

## 2022-11-25 DIAGNOSIS — Z79899 Other long term (current) drug therapy: Secondary | ICD-10-CM | POA: Insufficient documentation

## 2022-11-25 DIAGNOSIS — Z9049 Acquired absence of other specified parts of digestive tract: Secondary | ICD-10-CM | POA: Insufficient documentation

## 2022-11-25 DIAGNOSIS — Z801 Family history of malignant neoplasm of trachea, bronchus and lung: Secondary | ICD-10-CM | POA: Insufficient documentation

## 2022-11-25 DIAGNOSIS — R5383 Other fatigue: Secondary | ICD-10-CM | POA: Diagnosis not present

## 2022-11-25 DIAGNOSIS — Z8049 Family history of malignant neoplasm of other genital organs: Secondary | ICD-10-CM | POA: Diagnosis not present

## 2022-11-25 DIAGNOSIS — Z8719 Personal history of other diseases of the digestive system: Secondary | ICD-10-CM | POA: Diagnosis not present

## 2022-11-25 DIAGNOSIS — E785 Hyperlipidemia, unspecified: Secondary | ICD-10-CM | POA: Diagnosis not present

## 2022-11-25 DIAGNOSIS — Z87891 Personal history of nicotine dependence: Secondary | ICD-10-CM | POA: Insufficient documentation

## 2022-11-25 DIAGNOSIS — Z800 Family history of malignant neoplasm of digestive organs: Secondary | ICD-10-CM | POA: Insufficient documentation

## 2022-11-25 DIAGNOSIS — R531 Weakness: Secondary | ICD-10-CM | POA: Insufficient documentation

## 2022-11-25 DIAGNOSIS — Z860100 Personal history of colon polyps, unspecified: Secondary | ICD-10-CM | POA: Insufficient documentation

## 2022-11-25 DIAGNOSIS — G8929 Other chronic pain: Secondary | ICD-10-CM | POA: Diagnosis not present

## 2022-11-25 DIAGNOSIS — J45909 Unspecified asthma, uncomplicated: Secondary | ICD-10-CM | POA: Diagnosis not present

## 2022-11-25 DIAGNOSIS — I252 Old myocardial infarction: Secondary | ICD-10-CM | POA: Diagnosis not present

## 2022-11-25 DIAGNOSIS — Z79890 Hormone replacement therapy (postmenopausal): Secondary | ICD-10-CM | POA: Insufficient documentation

## 2022-11-25 DIAGNOSIS — Z888 Allergy status to other drugs, medicaments and biological substances status: Secondary | ICD-10-CM | POA: Diagnosis not present

## 2022-11-25 DIAGNOSIS — Z833 Family history of diabetes mellitus: Secondary | ICD-10-CM | POA: Insufficient documentation

## 2022-11-25 DIAGNOSIS — D508 Other iron deficiency anemias: Secondary | ICD-10-CM

## 2022-11-25 DIAGNOSIS — D509 Iron deficiency anemia, unspecified: Secondary | ICD-10-CM | POA: Diagnosis present

## 2022-11-25 DIAGNOSIS — Z880 Allergy status to penicillin: Secondary | ICD-10-CM | POA: Insufficient documentation

## 2022-11-25 MED ORDER — SODIUM CHLORIDE 0.9 % IV SOLN
200.0000 mg | Freq: Once | INTRAVENOUS | Status: AC
  Administered 2022-11-25: 200 mg via INTRAVENOUS
  Filled 2022-11-25: qty 200

## 2022-11-25 MED ORDER — SODIUM CHLORIDE 0.9 % IV SOLN
INTRAVENOUS | Status: DC
  Filled 2022-11-25: qty 250

## 2022-11-25 NOTE — Patient Instructions (Signed)
Iron Sucrose Injection What is this medication? IRON SUCROSE (EYE ern SOO krose) treats low levels of iron (iron deficiency anemia) in people with kidney disease. Iron is a mineral that plays an important role in making red blood cells, which carry oxygen from your lungs to the rest of your body. This medicine may be used for other purposes; ask your health care provider or pharmacist if you have questions. COMMON BRAND NAME(S): Venofer What should I tell my care team before I take this medication? They need to know if you have any of these conditions: Anemia not caused by low iron levels Heart disease High levels of iron in the blood Kidney disease Liver disease An unusual or allergic reaction to iron, other medications, foods, dyes, or preservatives Pregnant or trying to get pregnant Breastfeeding How should I use this medication? This medication is for infusion into a vein. It is given in a hospital or clinic setting. Talk to your care team about the use of this medication in children. While this medication may be prescribed for children as young as 2 years for selected conditions, precautions do apply. Overdosage: If you think you have taken too much of this medicine contact a poison control center or emergency room at once. NOTE: This medicine is only for you. Do not share this medicine with others. What if I miss a dose? Keep appointments for follow-up doses. It is important not to miss your dose. Call your care team if you are unable to keep an appointment. What may interact with this medication? Do not take this medication with any of the following: Deferoxamine Dimercaprol Other iron products This medication may also interact with the following: Chloramphenicol Deferasirox This list may not describe all possible interactions. Give your health care provider a list of all the medicines, herbs, non-prescription drugs, or dietary supplements you use. Also tell them if you smoke,  drink alcohol, or use illegal drugs. Some items may interact with your medicine. What should I watch for while using this medication? Visit your care team regularly. Tell your care team if your symptoms do not start to get better or if they get worse. You may need blood work done while you are taking this medication. You may need to follow a special diet. Talk to your care team. Foods that contain iron include: whole grains/cereals, dried fruits, beans, or peas, leafy green vegetables, and organ meats (liver, kidney). What side effects may I notice from receiving this medication? Side effects that you should report to your care team as soon as possible: Allergic reactions--skin rash, itching, hives, swelling of the face, lips, tongue, or throat Low blood pressure--dizziness, feeling faint or lightheaded, blurry vision Shortness of breath Side effects that usually do not require medical attention (report to your care team if they continue or are bothersome): Flushing Headache Joint pain Muscle pain Nausea Pain, redness, or irritation at injection site This list may not describe all possible side effects. Call your doctor for medical advice about side effects. You may report side effects to FDA at 1-800-FDA-1088. Where should I keep my medication? This medication is given in a hospital or clinic. It will not be stored at home. NOTE: This sheet is a summary. It may not cover all possible information. If you have questions about this medicine, talk to your doctor, pharmacist, or health care provider.  2024 Elsevier/Gold Standard (2022-07-16 00:00:00)

## 2022-11-26 ENCOUNTER — Other Ambulatory Visit: Payer: Self-pay | Admitting: *Deleted

## 2022-11-26 DIAGNOSIS — D509 Iron deficiency anemia, unspecified: Secondary | ICD-10-CM

## 2022-11-29 ENCOUNTER — Inpatient Hospital Stay: Payer: No Typology Code available for payment source

## 2022-11-29 DIAGNOSIS — D509 Iron deficiency anemia, unspecified: Secondary | ICD-10-CM | POA: Diagnosis not present

## 2022-11-29 LAB — CBC WITH DIFFERENTIAL/PLATELET
Abs Immature Granulocytes: 0.01 10*3/uL (ref 0.00–0.07)
Basophils Absolute: 0 10*3/uL (ref 0.0–0.1)
Basophils Relative: 1 %
Eosinophils Absolute: 0 10*3/uL (ref 0.0–0.5)
Eosinophils Relative: 1 %
HCT: 30.6 % — ABNORMAL LOW (ref 39.0–52.0)
Hemoglobin: 8.7 g/dL — ABNORMAL LOW (ref 13.0–17.0)
Immature Granulocytes: 0 %
Lymphocytes Relative: 28 %
Lymphs Abs: 1.1 10*3/uL (ref 0.7–4.0)
MCH: 22.6 pg — ABNORMAL LOW (ref 26.0–34.0)
MCHC: 28.4 g/dL — ABNORMAL LOW (ref 30.0–36.0)
MCV: 79.5 fL — ABNORMAL LOW (ref 80.0–100.0)
Monocytes Absolute: 0.3 10*3/uL (ref 0.1–1.0)
Monocytes Relative: 7 %
Neutro Abs: 2.5 10*3/uL (ref 1.7–7.7)
Neutrophils Relative %: 63 %
Platelets: 277 10*3/uL (ref 150–400)
RBC: 3.85 MIL/uL — ABNORMAL LOW (ref 4.22–5.81)
RDW: 21.8 % — ABNORMAL HIGH (ref 11.5–15.5)
WBC: 4 10*3/uL (ref 4.0–10.5)
nRBC: 0 % (ref 0.0–0.2)

## 2022-11-29 LAB — SAMPLE TO BLOOD BANK

## 2022-11-29 LAB — IRON AND TIBC
Iron: 23 ug/dL — ABNORMAL LOW (ref 45–182)
Saturation Ratios: 6 % — ABNORMAL LOW (ref 17.9–39.5)
TIBC: 382 ug/dL (ref 250–450)
UIBC: 359 ug/dL

## 2022-11-29 LAB — FERRITIN: Ferritin: 57 ng/mL (ref 24–336)

## 2022-11-30 ENCOUNTER — Encounter: Payer: Self-pay | Admitting: Oncology

## 2022-11-30 ENCOUNTER — Inpatient Hospital Stay: Payer: No Typology Code available for payment source

## 2022-11-30 ENCOUNTER — Inpatient Hospital Stay (HOSPITAL_BASED_OUTPATIENT_CLINIC_OR_DEPARTMENT_OTHER): Payer: No Typology Code available for payment source | Admitting: Oncology

## 2022-11-30 VITALS — BP 125/66 | HR 69 | Temp 97.0°F | Resp 16 | Ht 72.0 in | Wt 213.0 lb

## 2022-11-30 VITALS — BP 122/68 | HR 70

## 2022-11-30 DIAGNOSIS — D509 Iron deficiency anemia, unspecified: Secondary | ICD-10-CM | POA: Diagnosis not present

## 2022-11-30 DIAGNOSIS — D508 Other iron deficiency anemias: Secondary | ICD-10-CM

## 2022-11-30 MED ORDER — SODIUM CHLORIDE 0.9 % IV SOLN
200.0000 mg | Freq: Once | INTRAVENOUS | Status: AC
  Administered 2022-11-30: 200 mg via INTRAVENOUS
  Filled 2022-11-30: qty 200

## 2022-11-30 MED ORDER — SODIUM CHLORIDE 0.9 % IV SOLN
INTRAVENOUS | Status: DC | PRN
  Filled 2022-11-30: qty 250

## 2022-11-30 NOTE — Progress Notes (Signed)
[facility]  Telephone:(336) [phone] Fax:(336) [phone]  ID: Randy Mclean Younger OB: [birth year]  MR#: [id number]  IEP#:[id number]  Patient Care Team: Sallyanne Kuster, NP as PCP - General (Nurse Practitioner) Lyndon Code, MD (Internal Medicine) Jeralyn Ruths, MD as Consulting Physician (Oncology)  CHIEF COMPLAINT: Iron deficiency anemia.  INTERVAL HISTORY: Patient returns to clinic today for repeat laboratory work, further evaluation, and can sideration of additional IV Venofer.  He continues to have weakness and fatigue, but this is slowly improving.  He does not complain of any dizziness today.  He still has not heard back from GI.  He has no neurologic complaints.  He denies any recent fevers or illnesses.  He has no chest pain, shortness of breath, cough, or hemoptysis.  He denies any nausea, vomiting, constipation, or diarrhea.  He has no melena but has noticed bright red blood per rectum.  He has no urinary complaints.  Patient offers no further specific complaints today.  REVIEW OF SYSTEMS:   Review of Systems  Constitutional:  Positive for malaise/fatigue. Negative for fever and weight loss.  Respiratory: Negative.  Negative for cough and shortness of breath.   Cardiovascular: Negative.  Negative for chest pain and leg swelling.  Gastrointestinal:  Positive for blood in stool. Negative for abdominal pain and melena.  Genitourinary: Negative.  Negative for hematuria.  Musculoskeletal: Negative.  Negative for back pain and neck pain.  Skin: Negative.  Negative for rash.  Neurological:  Positive for weakness. Negative for dizziness, sensory change, focal weakness and headaches.  Psychiatric/Behavioral: Negative.  The patient is not nervous/anxious.     As per HPI. Otherwise, a complete review of systems is negative.  PAST MEDICAL HISTORY: Past Medical History:  Diagnosis Date   Acute appendicitis 12/04/2017   Allergy    takes allergy shots    Anemia    Arthritis    Asthma    Barrett esophagus    Bronchitis    Chronic pain    Colon polyp    Depression    GERD (gastroesophageal reflux disease)    Hemorrhoid    Hyperlipidemia    Hypertension    Hypothyroid    IBS (irritable bowel syndrome)    Kidney stone    MI (myocardial infarction) (HCC)    between 2002 and 2006   Migraine    Pneumonia    Seizures (HCC)    Sinus problem    Spinal cord stimulator status     PAST SURGICAL HISTORY: Past Surgical History:  Procedure Laterality Date   ANTERIOR CERVICAL DISCECTOMY     APPENDECTOMY     BACK SURGERY     BACK SURGERY  12/04/2020   COLONOSCOPY  09/2013   COLONOSCOPY N/A 05/09/2022   Procedure: COLONOSCOPY;  Surgeon: Jaynie Collins, DO;  Location: [facility] ENDOSCOPY;  Service: Gastroenterology;  Laterality: N/A;   elbow surgery     ESOPHAGOGASTRODUODENOSCOPY (EGD) WITH PROPOFOL N/A 05/09/2022   Procedure: ESOPHAGOGASTRODUODENOSCOPY (EGD) WITH PROPOFOL;  Surgeon: Jaynie Collins, DO;  Location: [facility] ENDOSCOPY;  Service: Gastroenterology;  Laterality: N/A;   GIVENS CAPSULE STUDY N/A 05/09/2022   Procedure: GIVENS CAPSULE STUDY;  Surgeon: Jaynie Collins, DO;  Location: [facility] ENDOSCOPY;  Service: Gastroenterology;  Laterality: N/A;   HEMORRHOID SURGERY     LAPAROSCOPIC APPENDECTOMY N/A 12/04/2017   Procedure: APPENDECTOMY LAPAROSCOPIC;  Surgeon: Ancil Linsey, MD;  Location: [facility] ORS;  Service: General;  Laterality: N/A;   LIPOMA EXCISION Right    leg  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     SPINAL CORD STIMULATOR IMPLANT     UPPER GI ENDOSCOPY  09/2013   WRIST SURGERY Left 01/14/2021    FAMILY HISTORY Family History  Problem Relation Age of Onset   Hypertension Mother    Lung disease Mother    Heart attack Father    Diabetes Father    Liver cancer Sister    Lung cancer Sister    Colon cancer Sister    Uterine cancer Sister        ADVANCED DIRECTIVES:    HEALTH  MAINTENANCE: Social History   Tobacco Use   Smoking status: Former    Current packs/day: 0.00    Average packs/day: 1 pack/day for 30.0 years (30.0 ttl pk-yrs)    Types: Cigarettes    Start date: 02/23/1975    Quit date: 02/22/2005    Years since quitting: 17.7   Smokeless tobacco: Never  Vaping Use   Vaping status: Never Used  Substance Use Topics   Alcohol use: Yes    Alcohol/week: 0.0 standard drinks of alcohol    Comment: occasionally   Drug use: No    Comment: past     Colonoscopy:  PAP:  Bone density:  Lipid panel:  Allergies  Allergen Reactions   Amoxicillin Anaphylaxis    REACTION: unspecified   Penicillin G Anaphylaxis   Gabapentin Other (See Comments)    REACTION: anaphylaxis    Current Outpatient Medications  Medication Sig Dispense Refill   alfuzosin (UROXATRAL) 10 MG 24 hr tablet TAKE 1 TABLET BY MOUTH EVERYDAY AT BEDTIME 90 tablet 1   ALPRAZolam (XANAX) 0.5 MG tablet Take 1 tablet (0.5 mg total) by mouth 3 (three) times daily as needed for anxiety. 90 tablet 2   amLODipine (NORVASC) 10 MG tablet TAKE 1 TABLET BY MOUTH EVERY DAY 90 tablet 1   atorvastatin (LIPITOR) 10 MG tablet TAKE 1 TABLET BY MOUTH EVERY DAY 90 tablet 1   buPROPion (WELLBUTRIN XL) 300 MG 24 hr tablet TAKE 1 TABLET BY MOUTH DAILY, GENERIC EQUIVALENT FOR WELLBUTRIN XL. DUE FOR OFFICE VISIT. 90 tablet 1   Dietary Management Product (VB6 P5P PO) Take by mouth.     esomeprazole (NEXIUM) 40 MG capsule TAKE 1 CAPSULE (40 MG TOTAL) BY MOUTH 2 (TWO) TIMES DAILY BEFORE A MEAL. 180 capsule 1   ferrous sulfate 325 (65 FE) MG EC tablet Take 1 tablet (325 mg total) by mouth every other day. 45 tablet 1   hydrochlorothiazide (HYDRODIURIL) 12.5 MG tablet TAKE 1 TABLET BY MOUTH EVERY DAY 90 tablet 1   levothyroxine (SYNTHROID) 150 MCG tablet TAKE 1 TABLET BY MOUTH EVERY DAY BEFORE BREAKFAST 90 tablet 1   methocarbamol (ROBAXIN) 750 MG tablet Take 750 mg by mouth 4 (four) times daily as needed.     modafinil  (PROVIGIL) 200 MG tablet Take 1 tablet (200 mg total) by mouth daily. 30 tablet 2   mometasone-formoterol (DULERA) 100-5 MCG/ACT AERO INHALE 2 PUFFS BY MOUTH INTO THE LUNGS EVERY 12 HOURS 39 g 3   Multiple Vitamin (MULTI-VITAMINS) TABS Take 1 tablet by mouth daily.     pregabalin (LYRICA) 100 MG capsule Take 1 capsule (100 mg total) by mouth every 8 (eight) hours. 10 capsule 0   promethazine-dextromethorphan (PROMETHAZINE-DM) 6.25-15 MG/5ML syrup Take 5 mLs by mouth 4 (four) times daily as needed for cough. 180 mL 0   SYRINGE-NEEDLE, DISP, 3 ML 22G X 1" 3 ML MISC Use as directed.  tadalafil (CIALIS) 20 MG tablet Take 0.5-1 tablets (10-20 mg total) by mouth daily as needed for erectile dysfunction. 10 tablet 5   testosterone cypionate (DEPOTESTOSTERONE CYPIONATE) 200 MG/ML injection INJECT 0.75ML ONCE A WEEK AS DIRECTED 10 mL 0   tiZANidine (ZANAFLEX) 4 MG tablet Take 4 mg by mouth every 6 (six) hours as needed for muscle spasms. Pt takes one tablet at night     valsartan (DIOVAN) 80 MG tablet Take by mouth.     No current facility-administered medications for this visit.    OBJECTIVE: Vitals:   11/30/22 1300  BP: 125/66  Pulse: 69  Resp: 16  Temp: (!) 97 F (36.1 C)  SpO2: 100%        Body mass index is 28.89 kg/m.    ECOG FS:1 - Symptomatic but completely ambulatory  General: Well-developed, well-nourished, no acute distress. Eyes: Pink conjunctiva, anicteric sclera. HEENT: Normocephalic, moist mucous membranes. Lungs: No audible wheezing or coughing. Heart: Regular rate and rhythm. Abdomen: Soft, nontender, no obvious distention. Musculoskeletal: No edema, cyanosis, or clubbing. Neuro: Alert, answering all questions appropriately. Cranial nerves grossly intact. Skin: No rashes or petechiae noted. Psych: Normal affect.  LAB RESULTS:  Lab Results  Component Value Date   NA 137 10/18/2022   K 3.5 10/18/2022   CL 102 10/18/2022   CO2 28 10/18/2022   GLUCOSE 97  10/18/2022   BUN 9 10/18/2022   CREATININE 1.14 10/18/2022   CALCIUM 8.8 (L) 10/18/2022   PROT 7.0 10/18/2022   ALBUMIN 4.1 10/18/2022   AST 18 10/18/2022   ALT 20 10/18/2022   ALKPHOS 64 10/18/2022   BILITOT 0.4 10/18/2022   GFRNONAA >60 10/18/2022   GFRAA >60 05/05/2019    Lab Results  Component Value Date   WBC 4.0 11/29/2022   NEUTROABS 2.5 11/29/2022   HGB 8.7 (L) 11/29/2022   HCT 30.6 (L) 11/29/2022   MCV 79.5 (L) 11/29/2022   PLT 277 11/29/2022   Lab Results  Component Value Date   IRON 23 (L) 11/29/2022   TIBC 382 11/29/2022   IRONPCTSAT 6 (L) 11/29/2022   Lab Results  Component Value Date   FERRITIN 57 11/29/2022     STUDIES: No results found.  ASSESSMENT: Iron deficiency anemia.  PLAN:    Iron deficiency anemia: EGD and colonoscopy on May 09, 2022 did not reveal any significant pathology.  Patient also had capsule endoscopy, but these results are not available at this time.  Patient reports he is still actively bleeding and is waiting for a appointment with gastroenterology.  His hemoglobin and iron stores are slowly trending up.  Proceed with additional IV Venofer today.  Return to clinic 2 more times for additional treatment.  Patient will then return to clinic in 4 weeks with repeat laboratory work, further evaluation, and additional IV Venofer if needed.   GI bleed: Continue follow-up with GI as above.   Family history of colon cancer:  Patient gets routine colonoscopies given his family history of colon cancer, all of which been unrevealing.  Unclear when his last colonoscopy was completed.  His genetic testing is negative.  I spent a total of 30 minutes reviewing chart data, face-to-face evaluation with the patient, counseling and coordination of care as detailed above.   Patient expressed understanding and was in agreement with this plan. He also understands that He can call clinic at any time with any questions, concerns, or complaints.     Jeralyn Ruths, MD   11/30/2022 1:38  PM

## 2022-11-30 NOTE — Progress Notes (Signed)
Still concerned about this "icy" feeling he has in his chest when walking.

## 2022-12-03 ENCOUNTER — Inpatient Hospital Stay: Payer: No Typology Code available for payment source

## 2022-12-03 VITALS — BP 127/57 | HR 95 | Temp 99.2°F | Resp 18

## 2022-12-03 DIAGNOSIS — D509 Iron deficiency anemia, unspecified: Secondary | ICD-10-CM | POA: Diagnosis not present

## 2022-12-03 DIAGNOSIS — D508 Other iron deficiency anemias: Secondary | ICD-10-CM

## 2022-12-03 MED ORDER — SODIUM CHLORIDE 0.9 % IV SOLN
Freq: Once | INTRAVENOUS | Status: AC
  Filled 2022-12-03: qty 250

## 2022-12-03 MED ORDER — SODIUM CHLORIDE 0.9 % IV SOLN
200.0000 mg | Freq: Once | INTRAVENOUS | Status: AC
  Administered 2022-12-03: 200 mg via INTRAVENOUS
  Filled 2022-12-03: qty 200

## 2022-12-08 ENCOUNTER — Inpatient Hospital Stay: Payer: No Typology Code available for payment source

## 2022-12-08 VITALS — BP 131/56 | HR 70 | Temp 97.3°F | Resp 18

## 2022-12-08 DIAGNOSIS — D508 Other iron deficiency anemias: Secondary | ICD-10-CM

## 2022-12-08 DIAGNOSIS — D509 Iron deficiency anemia, unspecified: Secondary | ICD-10-CM | POA: Diagnosis not present

## 2022-12-08 MED ORDER — SODIUM CHLORIDE 0.9 % IV SOLN
INTRAVENOUS | Status: DC
  Filled 2022-12-08: qty 250

## 2022-12-08 MED ORDER — SODIUM CHLORIDE 0.9 % IV SOLN
200.0000 mg | Freq: Once | INTRAVENOUS | Status: AC
  Administered 2022-12-08: 200 mg via INTRAVENOUS
  Filled 2022-12-08: qty 200

## 2022-12-08 NOTE — Progress Notes (Signed)
Pt has been educated and understands. Pt declined to stay 30 mins after iron infusion. VSS.

## 2022-12-15 ENCOUNTER — Telehealth: Payer: Self-pay | Admitting: Nurse Practitioner

## 2022-12-15 NOTE — Telephone Encounter (Signed)
Emailed receipt to patient

## 2022-12-19 NOTE — Procedures (Signed)
[facility] [address] [place], [address]    Complete Pulmonary Function Testing Interpretation:  FINDINGS:  Forced vital capacity is normal.  FEV1 is normal.  FEV1 FVC ratio is normal.  Postbronchodilator no significant change in FEV1 is noted.  Total lung capacity is mildly decreased.  Residual volume is decreased.  FRC is decreased.  Residual on total opacification is decreased.  DLCO was moderately decreased.  IMPRESSION:  This pulmonary function study is consistent with mild restrictive lung disease  Yevonne Pax, MD [facility] Pulmonary Critical Care Medicine Sleep Medicine

## 2022-12-24 ENCOUNTER — Other Ambulatory Visit: Payer: Self-pay | Admitting: Medical Genetics

## 2022-12-24 DIAGNOSIS — Z006 Encounter for examination for normal comparison and control in clinical research program: Secondary | ICD-10-CM

## 2022-12-28 ENCOUNTER — Telehealth: Payer: Self-pay | Admitting: Nurse Practitioner

## 2022-12-28 ENCOUNTER — Encounter: Payer: Self-pay | Admitting: Nurse Practitioner

## 2022-12-28 ENCOUNTER — Telehealth (INDEPENDENT_AMBULATORY_CARE_PROVIDER_SITE_OTHER): Payer: No Typology Code available for payment source | Admitting: Nurse Practitioner

## 2022-12-28 VITALS — Resp 16 | Ht 72.0 in | Wt 208.0 lb

## 2022-12-28 DIAGNOSIS — J069 Acute upper respiratory infection, unspecified: Secondary | ICD-10-CM | POA: Diagnosis not present

## 2022-12-28 DIAGNOSIS — R053 Chronic cough: Secondary | ICD-10-CM | POA: Diagnosis not present

## 2022-12-28 DIAGNOSIS — K644 Residual hemorrhoidal skin tags: Secondary | ICD-10-CM | POA: Diagnosis not present

## 2022-12-28 MED ORDER — PROMETHAZINE-DM 6.25-15 MG/5ML PO SYRP: 5.0000 mL | ORAL_SOLUTION | Freq: Four times a day (QID) | ORAL | 1 refills | Status: DC | PRN

## 2022-12-28 MED ORDER — LEVOFLOXACIN 750 MG PO TABS: 750.0000 mg | ORAL_TABLET | Freq: Every day | ORAL | 0 refills | Status: DC

## 2022-12-28 MED ORDER — PREDNISONE 10 MG (21) PO TBPK: ORAL_TABLET | ORAL | 0 refills | Status: DC

## 2022-12-28 MED ORDER — BENZONATATE 200 MG PO CAPS: 200.0000 mg | ORAL_CAPSULE | Freq: Two times a day (BID) | ORAL | 0 refills | Status: DC | PRN

## 2022-12-28 NOTE — Telephone Encounter (Signed)
Gastroenterology referral sent via [facility] to Dr. Timothy Lasso with [facility]. Notified patient. Gave pt telephone # [phone]

## 2022-12-28 NOTE — Progress Notes (Signed)
[facility] [address] [place], [place] [address]  Internal MEDICINE  Telephone Visit  Patient Name: Randy Mclean  [id number]  [id number]  Date of Service: 12/28/2022  I connected with the patient at 1200 by telephone and verified the patients identity using two identifiers.   I discussed the limitations, risks, security and privacy concerns of performing an evaluation and management service by telephone and the availability of in person appointments. I also discussed with the patient that there may be a patient responsible charge related to the service.  The patient expressed understanding and agrees to proceed.    Chief Complaint  Patient presents with   Telephone Screen    Started last Thursday afternoon. Coughing, congestion, headache, chills, fever- covid test negative. Ribs, neck and back hurt from coughing. Green and brown mucus. Fever, Not sleeping well. Tried nasal spray, over counter medicine not working. Voice is about gone.     Telephone Assessment    HPI Randy Mclean presents for a telehealth virtual visit for Started last Thursday afternoon. Coughing, congestion, headache, chills, fever- covid test negative. Ribs, neck and back hurt from coughing. Green and brown mucus. Fever, Not sleeping well. Tried nasal spray, over counter medicine not working. Voice is about gone.  --also has hemorrhoids, requesting GI referral. -- Dr. Elfredia Nevins  Current Medication: Outpatient Encounter Medications as of 12/28/2022  Medication Sig   alfuzosin (UROXATRAL) 10 MG 24 hr tablet TAKE 1 TABLET BY MOUTH EVERYDAY AT BEDTIME   ALPRAZolam (XANAX) 0.5 MG tablet Take 1 tablet (0.5 mg total) by mouth 3 (three) times daily as needed for anxiety.   amLODipine (NORVASC) 10 MG tablet TAKE 1 TABLET BY MOUTH EVERY DAY   atorvastatin (LIPITOR) 10 MG tablet TAKE 1 TABLET BY MOUTH EVERY DAY   benzonatate (TESSALON) 200 MG capsule Take 1 capsule (200 mg total) by mouth 2 (two) times  daily as needed for cough.   buPROPion (WELLBUTRIN XL) 300 MG 24 hr tablet TAKE 1 TABLET BY MOUTH DAILY, GENERIC EQUIVALENT FOR WELLBUTRIN XL. DUE FOR OFFICE VISIT.   Dietary Management Product (VB6 P5P PO) Take by mouth.   esomeprazole (NEXIUM) 40 MG capsule TAKE 1 CAPSULE (40 MG TOTAL) BY MOUTH 2 (TWO) TIMES DAILY BEFORE A MEAL.   ferrous sulfate 325 (65 FE) MG EC tablet Take 1 tablet (325 mg total) by mouth every other day.   hydrochlorothiazide (HYDRODIURIL) 12.5 MG tablet TAKE 1 TABLET BY MOUTH EVERY DAY   levofloxacin (LEVAQUIN) 750 MG tablet Take 1 tablet (750 mg total) by mouth daily.   levothyroxine (SYNTHROID) 150 MCG tablet TAKE 1 TABLET BY MOUTH EVERY DAY BEFORE BREAKFAST   methocarbamol (ROBAXIN) 750 MG tablet Take 750 mg by mouth 4 (four) times daily as needed.   modafinil (PROVIGIL) 200 MG tablet Take 1 tablet (200 mg total) by mouth daily.   mometasone-formoterol (DULERA) 100-5 MCG/ACT AERO INHALE 2 PUFFS BY MOUTH INTO THE LUNGS EVERY 12 HOURS   Multiple Vitamin (MULTI-VITAMINS) TABS Take 1 tablet by mouth daily.   predniSONE (STERAPRED UNI-PAK 21 TAB) 10 MG (21) TBPK tablet Use as directed for 6 days   pregabalin (LYRICA) 100 MG capsule Take 1 capsule (100 mg total) by mouth every 8 (eight) hours.   SYRINGE-NEEDLE, DISP, 3 ML 22G X 1" 3 ML MISC Use as directed.   tadalafil (CIALIS) 20 MG tablet Take 0.5-1 tablets (10-20 mg total) by mouth daily as needed for erectile dysfunction.   testosterone cypionate (DEPOTESTOSTERONE CYPIONATE) 200  MG/ML injection INJECT 0.75ML ONCE A WEEK AS DIRECTED   tiZANidine (ZANAFLEX) 4 MG tablet Take 4 mg by mouth every 6 (six) hours as needed for muscle spasms. Pt takes one tablet at night   valsartan (DIOVAN) 80 MG tablet Take by mouth.   [DISCONTINUED] promethazine-dextromethorphan (PROMETHAZINE-DM) 6.25-15 MG/5ML syrup Take 5 mLs by mouth 4 (four) times daily as needed for cough.   promethazine-dextromethorphan (PROMETHAZINE-DM) 6.25-15 MG/5ML  syrup Take 5 mLs by mouth 4 (four) times daily as needed for cough.   No facility-administered encounter medications on file as of 12/28/2022.    Surgical History: Past Surgical History:  Procedure Laterality Date   ANTERIOR CERVICAL DISCECTOMY     APPENDECTOMY     BACK SURGERY     BACK SURGERY  12/04/2020   COLONOSCOPY  09/2013   COLONOSCOPY N/A 05/09/2022   Procedure: COLONOSCOPY;  Surgeon: Jaynie Collins, DO;  Location: [facility] ENDOSCOPY;  Service: Gastroenterology;  Laterality: N/A;   elbow surgery     ESOPHAGOGASTRODUODENOSCOPY (EGD) WITH PROPOFOL N/A 05/09/2022   Procedure: ESOPHAGOGASTRODUODENOSCOPY (EGD) WITH PROPOFOL;  Surgeon: Jaynie Collins, DO;  Location: [facility] ENDOSCOPY;  Service: Gastroenterology;  Laterality: N/A;   GIVENS CAPSULE STUDY N/A 05/09/2022   Procedure: GIVENS CAPSULE STUDY;  Surgeon: Jaynie Collins, DO;  Location: [facility] ENDOSCOPY;  Service: Gastroenterology;  Laterality: N/A;   HEMORRHOID SURGERY     LAPAROSCOPIC APPENDECTOMY N/A 12/04/2017   Procedure: APPENDECTOMY LAPAROSCOPIC;  Surgeon: Ancil Linsey, MD;  Location: [facility] ORS;  Service: General;  Laterality: N/A;   LIPOMA EXCISION Right    leg   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     SPINAL CORD STIMULATOR IMPLANT     UPPER GI ENDOSCOPY  09/2013   WRIST SURGERY Left 01/14/2021    Medical History: Past Medical History:  Diagnosis Date   Acute appendicitis 12/04/2017   Allergy    takes allergy shots   Anemia    Arthritis    Asthma    Barrett esophagus    Bronchitis    Chronic pain    Colon polyp    Depression    GERD (gastroesophageal reflux disease)    Hemorrhoid    Hyperlipidemia    Hypertension    Hypothyroid    IBS (irritable bowel syndrome)    Kidney stone    MI (myocardial infarction) (HCC)    between 2002 and 2006   Migraine    Pneumonia    Seizures (HCC)    Sinus problem    Spinal cord stimulator status     Family History: Family History  Problem  Relation Age of Onset   Hypertension Mother    Lung disease Mother    Heart attack Father    Diabetes Father    Liver cancer Sister    Lung cancer Sister    Colon cancer Sister    Uterine cancer Sister     Social History   Socioeconomic History   Marital status: Married    Spouse name: Not on file   Number of children: Not on file   Years of education: Not on file   Highest education level: Not on file  Occupational History   Not on file  Tobacco Use   Smoking status: Former    Current packs/day: 0.00    Average packs/day: 1 pack/day for 30.0 years (30.0 ttl pk-yrs)    Types: Cigarettes    Start date: 02/23/1975    Quit date: 02/22/2005    Years since  quitting: 17.8   Smokeless tobacco: Never  Vaping Use   Vaping status: Never Used  Substance and Sexual Activity   Alcohol use: Yes    Alcohol/week: 0.0 standard drinks of alcohol    Comment: occasionally   Drug use: No    Comment: past   Sexual activity: Yes  Other Topics Concern   Not on file  Social History Narrative   Not on file   Social Determinants of Health   Financial Resource Strain: Not on file  Food Insecurity: No Food Insecurity (05/07/2022)   Hunger Vital Sign    Worried About Running Out of Food in the Last Year: Never true    Ran Out of Food in the Last Year: Never true  Transportation Needs: No Transportation Needs (05/07/2022)   PRAPARE - Administrator, Civil Service (Medical): No    Lack of Transportation (Non-Medical): No  Physical Activity: Not on file  Stress: Not on file  Social Connections: Not on file  Intimate Partner Violence: Not At Risk (05/07/2022)   Humiliation, Afraid, Rape, and Kick questionnaire    Fear of Current or Ex-Partner: No    Emotionally Abused: No    Physically Abused: No    Sexually Abused: No      Review of Systems  Constitutional:  Positive for appetite change and fatigue. Negative for chills and fever.  HENT:  Positive for congestion.    Respiratory:  Positive for cough, chest tightness, shortness of breath and wheezing.   Cardiovascular: Negative.  Negative for chest pain and palpitations.    Vital Signs: Resp 16   Ht 6' (1.829 m)   Wt 208 lb (94.3 kg)   BMI 28.21 kg/m    Observation/Objective: He is alert and oriented. Voice is hoarse and patient is coughing and wheezing audibly.     Assessment/Plan: 1. Upper respiratory tract infection, unspecified type Antibiotics and prednisone taper prescribed, take until gone. - levofloxacin (LEVAQUIN) 750 MG tablet; Take 1 tablet (750 mg total) by mouth daily.  Dispense: 7 tablet; Refill: 0 - predniSONE (STERAPRED UNI-PAK 21 TAB) 10 MG (21) TBPK tablet; Use as directed for 6 days  Dispense: 21 tablet; Refill: 0  2. External hemorrhoids Referred to GI as requested  - Ambulatory referral to Gastroenterology  3. Persistent cough for 3 weeks or longer Medications prescribed to treat the cough - promethazine-dextromethorphan (PROMETHAZINE-DM) 6.25-15 MG/5ML syrup; Take 5 mLs by mouth 4 (four) times daily as needed for cough.  Dispense: 180 mL; Refill: 1 - benzonatate (TESSALON) 200 MG capsule; Take 1 capsule (200 mg total) by mouth 2 (two) times daily as needed for cough.  Dispense: 30 capsule; Refill: 0   General Counseling: Kaesen verbalizes understanding of the findings of today's phone visit and agrees with plan of treatment. I have discussed any further diagnostic evaluation that may be needed or ordered today. We also reviewed his medications today. he has been encouraged to call the office with any questions or concerns that should arise related to todays visit.  Return if symptoms worsen or fail to improve.   Orders Placed This Encounter  Procedures   Ambulatory referral to Gastroenterology    Meds ordered this encounter  Medications   levofloxacin (LEVAQUIN) 750 MG tablet    Sig: Take 1 tablet (750 mg total) by mouth daily.    Dispense:  7 tablet     Refill:  0    Fill new script today asap   predniSONE (STERAPRED UNI-PAK 21  TAB) 10 MG (21) TBPK tablet    Sig: Use as directed for 6 days    Dispense:  21 tablet    Refill:  0    Fill new script today asap   promethazine-dextromethorphan (PROMETHAZINE-DM) 6.25-15 MG/5ML syrup    Sig: Take 5 mLs by mouth 4 (four) times daily as needed for cough.    Dispense:  180 mL    Refill:  1    Fill new script today asap   benzonatate (TESSALON) 200 MG capsule    Sig: Take 1 capsule (200 mg total) by mouth 2 (two) times daily as needed for cough.    Dispense:  30 capsule    Refill:  0    Fill new script today    Time spent:10 Minutes Time spent with patient included reviewing progress notes, labs, imaging studies, and discussing plan for follow up.  [place] Controlled Substance Database was reviewed by me for overdose risk score (ORS) if appropriate.  This patient was seen by Sallyanne Kuster, FNP-C in collaboration with Dr. Beverely Risen as a part of collaborative care agreement.  Saskia Simerson R. Tedd Sias, MSN, FNP-C Internal medicine

## 2022-12-29 ENCOUNTER — Telehealth: Payer: Self-pay | Admitting: Nurse Practitioner

## 2022-12-29 NOTE — Telephone Encounter (Signed)
error 

## 2022-12-30 ENCOUNTER — Other Ambulatory Visit: Payer: Self-pay | Admitting: *Deleted

## 2022-12-30 DIAGNOSIS — D508 Other iron deficiency anemias: Secondary | ICD-10-CM

## 2022-12-31 ENCOUNTER — Encounter: Payer: Self-pay | Admitting: Nurse Practitioner

## 2022-12-31 ENCOUNTER — Inpatient Hospital Stay: Payer: No Typology Code available for payment source | Attending: Oncology

## 2022-12-31 DIAGNOSIS — Z833 Family history of diabetes mellitus: Secondary | ICD-10-CM | POA: Diagnosis not present

## 2022-12-31 DIAGNOSIS — Z800 Family history of malignant neoplasm of digestive organs: Secondary | ICD-10-CM | POA: Diagnosis not present

## 2022-12-31 DIAGNOSIS — Z87891 Personal history of nicotine dependence: Secondary | ICD-10-CM | POA: Diagnosis not present

## 2022-12-31 DIAGNOSIS — Z79899 Other long term (current) drug therapy: Secondary | ICD-10-CM | POA: Insufficient documentation

## 2022-12-31 DIAGNOSIS — R531 Weakness: Secondary | ICD-10-CM | POA: Insufficient documentation

## 2022-12-31 DIAGNOSIS — Z801 Family history of malignant neoplasm of trachea, bronchus and lung: Secondary | ICD-10-CM | POA: Insufficient documentation

## 2022-12-31 DIAGNOSIS — R42 Dizziness and giddiness: Secondary | ICD-10-CM | POA: Diagnosis not present

## 2022-12-31 DIAGNOSIS — Z888 Allergy status to other drugs, medicaments and biological substances status: Secondary | ICD-10-CM | POA: Insufficient documentation

## 2022-12-31 DIAGNOSIS — I252 Old myocardial infarction: Secondary | ICD-10-CM | POA: Diagnosis not present

## 2022-12-31 DIAGNOSIS — Z8249 Family history of ischemic heart disease and other diseases of the circulatory system: Secondary | ICD-10-CM | POA: Insufficient documentation

## 2022-12-31 DIAGNOSIS — Z880 Allergy status to penicillin: Secondary | ICD-10-CM | POA: Insufficient documentation

## 2022-12-31 DIAGNOSIS — Z8719 Personal history of other diseases of the digestive system: Secondary | ICD-10-CM | POA: Diagnosis not present

## 2022-12-31 DIAGNOSIS — J45909 Unspecified asthma, uncomplicated: Secondary | ICD-10-CM | POA: Insufficient documentation

## 2022-12-31 DIAGNOSIS — R5383 Other fatigue: Secondary | ICD-10-CM | POA: Diagnosis not present

## 2022-12-31 DIAGNOSIS — Z79890 Hormone replacement therapy (postmenopausal): Secondary | ICD-10-CM | POA: Insufficient documentation

## 2022-12-31 DIAGNOSIS — Z23 Encounter for immunization: Secondary | ICD-10-CM | POA: Diagnosis not present

## 2022-12-31 DIAGNOSIS — Z8049 Family history of malignant neoplasm of other genital organs: Secondary | ICD-10-CM | POA: Insufficient documentation

## 2022-12-31 DIAGNOSIS — D508 Other iron deficiency anemias: Secondary | ICD-10-CM

## 2022-12-31 DIAGNOSIS — Z9049 Acquired absence of other specified parts of digestive tract: Secondary | ICD-10-CM | POA: Insufficient documentation

## 2022-12-31 DIAGNOSIS — D509 Iron deficiency anemia, unspecified: Secondary | ICD-10-CM | POA: Diagnosis present

## 2022-12-31 DIAGNOSIS — E039 Hypothyroidism, unspecified: Secondary | ICD-10-CM | POA: Diagnosis not present

## 2022-12-31 DIAGNOSIS — Z860100 Personal history of colon polyps, unspecified: Secondary | ICD-10-CM | POA: Insufficient documentation

## 2022-12-31 DIAGNOSIS — E785 Hyperlipidemia, unspecified: Secondary | ICD-10-CM | POA: Diagnosis not present

## 2022-12-31 LAB — CBC WITH DIFFERENTIAL/PLATELET
Abs Immature Granulocytes: 0.12 10*3/uL — ABNORMAL HIGH (ref 0.00–0.07)
Basophils Absolute: 0 10*3/uL (ref 0.0–0.1)
Basophils Relative: 0 %
Eosinophils Absolute: 0 10*3/uL (ref 0.0–0.5)
Eosinophils Relative: 0 %
HCT: 29.3 % — ABNORMAL LOW (ref 39.0–52.0)
Hemoglobin: 8.4 g/dL — ABNORMAL LOW (ref 13.0–17.0)
Immature Granulocytes: 1 %
Lymphocytes Relative: 9 %
Lymphs Abs: 0.8 10*3/uL (ref 0.7–4.0)
MCH: 21.9 pg — ABNORMAL LOW (ref 26.0–34.0)
MCHC: 28.7 g/dL — ABNORMAL LOW (ref 30.0–36.0)
MCV: 76.5 fL — ABNORMAL LOW (ref 80.0–100.0)
Monocytes Absolute: 0.4 10*3/uL (ref 0.1–1.0)
Monocytes Relative: 4 %
Neutro Abs: 7.7 10*3/uL (ref 1.7–7.7)
Neutrophils Relative %: 86 %
Platelets: 398 10*3/uL (ref 150–400)
RBC: 3.83 MIL/uL — ABNORMAL LOW (ref 4.22–5.81)
RDW: 19.6 % — ABNORMAL HIGH (ref 11.5–15.5)
WBC: 9 10*3/uL (ref 4.0–10.5)
nRBC: 0.2 % (ref 0.0–0.2)

## 2022-12-31 LAB — IRON AND TIBC
Iron: 19 ug/dL — ABNORMAL LOW (ref 45–182)
Saturation Ratios: 5 % — ABNORMAL LOW (ref 17.9–39.5)
TIBC: 399 ug/dL (ref 250–450)
UIBC: 380 ug/dL

## 2022-12-31 LAB — SAMPLE TO BLOOD BANK

## 2022-12-31 LAB — FERRITIN: Ferritin: 16 ng/mL — ABNORMAL LOW (ref 24–336)

## 2023-01-03 ENCOUNTER — Inpatient Hospital Stay: Payer: No Typology Code available for payment source

## 2023-01-03 ENCOUNTER — Encounter: Payer: Self-pay | Admitting: Oncology

## 2023-01-03 ENCOUNTER — Inpatient Hospital Stay (HOSPITAL_BASED_OUTPATIENT_CLINIC_OR_DEPARTMENT_OTHER): Payer: No Typology Code available for payment source | Admitting: Oncology

## 2023-01-03 VITALS — BP 153/75 | HR 94 | Temp 98.5°F | Resp 16 | Ht 72.0 in | Wt 212.0 lb

## 2023-01-03 DIAGNOSIS — D509 Iron deficiency anemia, unspecified: Secondary | ICD-10-CM

## 2023-01-03 DIAGNOSIS — D508 Other iron deficiency anemias: Secondary | ICD-10-CM

## 2023-01-03 MED ORDER — IRON SUCROSE 20 MG/ML IV SOLN
200.0000 mg | Freq: Once | INTRAVENOUS | Status: AC
  Administered 2023-01-03: 200 mg via INTRAVENOUS
  Filled 2023-01-03: qty 10

## 2023-01-03 NOTE — Progress Notes (Unsigned)
Having increased tiredness and dizziness over the past few weeks. Had a fainting spell a couple of weeks ago.

## 2023-01-03 NOTE — Progress Notes (Unsigned)
[facility]  Telephone:([phone]) [phone] [phone]  ID: Randy Mclean Younger OB: 1960/04/18  MR#: [id number]  BMW#:[id number]  Patient Care Team: Sallyanne Kuster, NP as PCP - General (Nurse Practitioner) Lyndon Code, MD (Internal Medicine) Jeralyn Ruths, MD as Consulting Physician (Oncology)  CHIEF COMPLAINT: Iron deficiency anemia.  INTERVAL HISTORY: Patient returns to clinic today for repeat laboratory work, further evaluation, and consideration of additional IV Venofer.  He continues to have chronic weakness and fatigue.  He also has occasional dizziness. He still has not heard back from GI and his primary care physician has resent the referral.  He has no other neurologic complaints.  He denies any recent fevers or illnesses.  He has no chest pain, shortness of breath, cough, or hemoptysis.  He denies any nausea, vomiting, constipation, or diarrhea.  He has no melena but has noticed bright red blood per rectum.  He has no urinary complaints.  Patient offers no further specific complaints today.  REVIEW OF SYSTEMS:   Review of Systems  Constitutional:  Positive for malaise/fatigue. Negative for fever and weight loss.  Respiratory: Negative.  Negative for cough and shortness of breath.   Cardiovascular: Negative.  Negative for chest pain and leg swelling.  Gastrointestinal:  Positive for blood in stool. Negative for abdominal pain and melena.  Genitourinary: Negative.  Negative for hematuria.  Musculoskeletal: Negative.  Negative for back pain and neck pain.  Skin: Negative.  Negative for rash.  Neurological:  Positive for dizziness and weakness. Negative for sensory change, focal weakness and headaches.  Psychiatric/Behavioral: Negative.  The patient is not nervous/anxious.     As per HPI. Otherwise, a complete review of systems is negative.  PAST MEDICAL HISTORY: Past Medical History:  Diagnosis Date   Acute appendicitis 12/04/2017   Allergy     takes allergy shots   Anemia    Arthritis    Asthma    Barrett esophagus    Bronchitis    Chronic pain    Colon polyp    Depression    GERD (gastroesophageal reflux disease)    Hemorrhoid    Hyperlipidemia    Hypertension    Hypothyroid    IBS (irritable bowel syndrome)    Kidney stone    MI (myocardial infarction) (HCC)    between 2002 and 2006   Migraine    Pneumonia    Seizures (HCC)    Sinus problem    Spinal cord stimulator status     PAST SURGICAL HISTORY: Past Surgical History:  Procedure Laterality Date   ANTERIOR CERVICAL DISCECTOMY     APPENDECTOMY     BACK SURGERY     BACK SURGERY  12/04/2020   COLONOSCOPY  09/2013   COLONOSCOPY N/A 05/09/2022   Procedure: COLONOSCOPY;  Surgeon: Jaynie Collins, DO;  Location: [facility] ENDOSCOPY;  Service: Gastroenterology;  Laterality: N/A;   elbow surgery     ESOPHAGOGASTRODUODENOSCOPY (EGD) WITH PROPOFOL N/A 05/09/2022   Procedure: ESOPHAGOGASTRODUODENOSCOPY (EGD) WITH PROPOFOL;  Surgeon: Jaynie Collins, DO;  Location: [facility] ENDOSCOPY;  Service: Gastroenterology;  Laterality: N/A;   GIVENS CAPSULE STUDY N/A 05/09/2022   Procedure: GIVENS CAPSULE STUDY;  Surgeon: Jaynie Collins, DO;  Location: [facility] ENDOSCOPY;  Service: Gastroenterology;  Laterality: N/A;   HEMORRHOID SURGERY     LAPAROSCOPIC APPENDECTOMY N/A 12/04/2017   Procedure: APPENDECTOMY LAPAROSCOPIC;  Surgeon: Ancil Linsey, MD;  Location: [facility] ORS;  Service: General;  Laterality: N/A;   LIPOMA EXCISION Right  leg   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     SPINAL CORD STIMULATOR IMPLANT     UPPER GI ENDOSCOPY  09/2013   WRIST SURGERY Left 01/14/2021    FAMILY HISTORY Family History  Problem Relation Age of Onset   Hypertension Mother    Lung disease Mother    Heart attack Father    Diabetes Father    Liver cancer Sister    Lung cancer Sister    Colon cancer Sister    Uterine cancer Sister        ADVANCED DIRECTIVES:     HEALTH MAINTENANCE: Social History   Tobacco Use   Smoking status: Former    Current packs/day: 0.00    Average packs/day: 1 pack/day for 30.0 years (30.0 ttl pk-yrs)    Types: Cigarettes    Start date: 02/23/1975    Quit date: 02/22/2005    Years since quitting: 17.8   Smokeless tobacco: Never  Vaping Use   Vaping status: Never Used  Substance Use Topics   Alcohol use: Yes    Alcohol/week: 0.0 standard drinks of alcohol    Comment: occasionally   Drug use: No    Comment: past     Colonoscopy:  PAP:  Bone density:  Lipid panel:  Allergies  Allergen Reactions   Amoxicillin Anaphylaxis    REACTION: unspecified   Penicillin G Anaphylaxis   Gabapentin Other (See Comments)    REACTION: anaphylaxis    Current Outpatient Medications  Medication Sig Dispense Refill   alfuzosin (UROXATRAL) 10 MG 24 hr tablet TAKE 1 TABLET BY MOUTH EVERYDAY AT BEDTIME 90 tablet 1   ALPRAZolam (XANAX) 0.5 MG tablet Take 1 tablet (0.5 mg total) by mouth 3 (three) times daily as needed for anxiety. 90 tablet 2   amLODipine (NORVASC) 10 MG tablet TAKE 1 TABLET BY MOUTH EVERY DAY 90 tablet 1   atorvastatin (LIPITOR) 10 MG tablet TAKE 1 TABLET BY MOUTH EVERY DAY 90 tablet 1   benzonatate (TESSALON) 200 MG capsule Take 1 capsule (200 mg total) by mouth 2 (two) times daily as needed for cough. 30 capsule 0   buPROPion (WELLBUTRIN XL) 300 MG 24 hr tablet TAKE 1 TABLET BY MOUTH DAILY, GENERIC EQUIVALENT FOR WELLBUTRIN XL. DUE FOR OFFICE VISIT. 90 tablet 1   Dietary Management Product (VB6 P5P PO) Take by mouth.     esomeprazole (NEXIUM) 40 MG capsule TAKE 1 CAPSULE (40 MG TOTAL) BY MOUTH 2 (TWO) TIMES DAILY BEFORE A MEAL. 180 capsule 1   ferrous sulfate 325 (65 FE) MG EC tablet Take 1 tablet (325 mg total) by mouth every other day. 45 tablet 1   hydrochlorothiazide (HYDRODIURIL) 12.5 MG tablet TAKE 1 TABLET BY MOUTH EVERY DAY 90 tablet 1   levofloxacin (LEVAQUIN) 750 MG tablet Take 1 tablet (750 mg  total) by mouth daily. 7 tablet 0   levothyroxine (SYNTHROID) 150 MCG tablet TAKE 1 TABLET BY MOUTH EVERY DAY BEFORE BREAKFAST 90 tablet 1   methocarbamol (ROBAXIN) 750 MG tablet Take 750 mg by mouth 4 (four) times daily as needed.     modafinil (PROVIGIL) 200 MG tablet Take 1 tablet (200 mg total) by mouth daily. 30 tablet 2   mometasone-formoterol (DULERA) 100-5 MCG/ACT AERO INHALE 2 PUFFS BY MOUTH INTO THE LUNGS EVERY 12 HOURS 39 g 3   Multiple Vitamin (MULTI-VITAMINS) TABS Take 1 tablet by mouth daily.     predniSONE (STERAPRED UNI-PAK 21 TAB) 10 MG (21) TBPK tablet Use as  directed for 6 days 21 tablet 0   pregabalin (LYRICA) 100 MG capsule Take 1 capsule (100 mg total) by mouth every 8 (eight) hours. 10 capsule 0   promethazine-dextromethorphan (PROMETHAZINE-DM) 6.25-15 MG/5ML syrup Take 5 mLs by mouth 4 (four) times daily as needed for cough. 180 mL 1   SYRINGE-NEEDLE, DISP, 3 ML 22G X 1" 3 ML MISC Use as directed.     tadalafil (CIALIS) 20 MG tablet Take 0.5-1 tablets (10-20 mg total) by mouth daily as needed for erectile dysfunction. 10 tablet 5   testosterone cypionate (DEPOTESTOSTERONE CYPIONATE) 200 MG/ML injection INJECT 0.75ML ONCE A WEEK AS DIRECTED 10 mL 0   tiZANidine (ZANAFLEX) 4 MG tablet Take 4 mg by mouth every 6 (six) hours as needed for muscle spasms. Pt takes one tablet at night     valsartan (DIOVAN) 80 MG tablet Take by mouth.     No current facility-administered medications for this visit.    OBJECTIVE: Vitals:   01/03/23 1428 01/03/23 1439  BP: (!) 165/79 (!) 153/75  Pulse: 88 94  Resp: 16   Temp: 98.5 F (36.9 C)   SpO2: 98% 96%       Body mass index is 28.75 kg/m.    ECOG FS:1 - Symptomatic but completely ambulatory  General: Well-developed, well-nourished, no acute distress. Eyes: Pink conjunctiva, anicteric sclera. HEENT: Normocephalic, moist mucous membranes. Lungs: No audible wheezing or coughing. Heart: Regular rate and rhythm. Abdomen: Soft,  nontender, no obvious distention. Musculoskeletal: No edema, cyanosis, or clubbing. Neuro: Alert, answering all questions appropriately. Cranial nerves grossly intact. Skin: No rashes or petechiae noted. Psych: Normal affect.  LAB RESULTS:  Lab Results  Component Value Date   NA 137 10/18/2022   K 3.5 10/18/2022   CL 102 10/18/2022   CO2 28 10/18/2022   GLUCOSE 97 10/18/2022   BUN 9 10/18/2022   CREATININE 1.14 10/18/2022   CALCIUM 8.8 (L) 10/18/2022   PROT 7.0 10/18/2022   ALBUMIN 4.1 10/18/2022   AST 18 10/18/2022   ALT 20 10/18/2022   ALKPHOS 64 10/18/2022   BILITOT 0.4 10/18/2022   GFRNONAA >60 10/18/2022   GFRAA >60 05/05/2019    Lab Results  Component Value Date   WBC 9.0 12/31/2022   NEUTROABS 7.7 12/31/2022   HGB 8.4 (L) 12/31/2022   HCT 29.3 (L) 12/31/2022   MCV 76.5 (L) 12/31/2022   PLT 398 12/31/2022   Lab Results  Component Value Date   IRON 19 (L) 12/31/2022   TIBC 399 12/31/2022   IRONPCTSAT 5 (L) 12/31/2022   Lab Results  Component Value Date   FERRITIN 16 (L) 12/31/2022     STUDIES: No results found.  ASSESSMENT: Iron deficiency anemia.  PLAN:    Iron deficiency anemia: EGD and colonoscopy on May 09, 2022 did not reveal any significant pathology.  Patient also had capsule endoscopy, but these results are not available at this time.  Patient reports he is still actively bleeding and is waiting for a appointment with gastroenterology.  His hemoglobin is decreased, but stable.  Iron stores remain decreased.  He is also symptomatic.  Proceed with additional IV Venofer today.  Return to clinic 4 times over the next 2 to 3 weeks for IV Venofer only.  Patient will then return to clinic in 6 weeks for laboratory work, further evaluation, and continuation of treatment if needed.  GI bleed: Continue follow-up with GI as above.   Family history of colon cancer:  Patient gets routine colonoscopies  given his family history of colon cancer, all of which  been unrevealing.  Unclear when his last colonoscopy was completed.  His genetic testing is negative. Dizziness/weakness/fatigue: Likely secondary to persistent blood loss and iron deficiency anemia.  I spent a total of 30 minutes reviewing chart data, face-to-face evaluation with the patient, counseling and coordination of care as detailed above.   Patient expressed understanding and was in agreement with this plan. He also understands that He can call clinic at any time with any questions, concerns, or complaints.    Jeralyn Ruths, MD   01/04/2023 9:52 AM

## 2023-01-04 ENCOUNTER — Encounter: Payer: Self-pay | Admitting: Oncology

## 2023-01-06 ENCOUNTER — Telehealth (INDEPENDENT_AMBULATORY_CARE_PROVIDER_SITE_OTHER): Payer: No Typology Code available for payment source | Admitting: Nurse Practitioner

## 2023-01-06 ENCOUNTER — Encounter: Payer: Self-pay | Admitting: Nurse Practitioner

## 2023-01-06 VITALS — Resp 16 | Ht 72.0 in | Wt 205.0 lb

## 2023-01-06 DIAGNOSIS — J22 Unspecified acute lower respiratory infection: Secondary | ICD-10-CM

## 2023-01-06 DIAGNOSIS — R053 Chronic cough: Secondary | ICD-10-CM

## 2023-01-06 MED ORDER — PREDNISONE 10 MG (21) PO TBPK: ORAL_TABLET | ORAL | 0 refills | Status: DC

## 2023-01-06 MED ORDER — DOXYCYCLINE HYCLATE 100 MG PO TABS: 100.0000 mg | ORAL_TABLET | Freq: Two times a day (BID) | ORAL | 0 refills | Status: DC

## 2023-01-06 MED ORDER — BENZONATATE 200 MG PO CAPS: 200.0000 mg | ORAL_CAPSULE | Freq: Two times a day (BID) | ORAL | 0 refills | Status: DC | PRN

## 2023-01-06 NOTE — Progress Notes (Signed)
[facility] [address] [place], [place] [address]  Internal MEDICINE  Telephone Visit  Patient Name: Randy Mclean  [id number]  [id number]  Date of Service: 01/06/2023  I connected with the patient at 1320 by telephone and verified the patients identity using two identifiers.   I discussed the limitations, risks, security and privacy concerns of performing an evaluation and management service by telephone and the availability of in person appointments. I also discussed with the patient that there may be a patient responsible charge related to the service.  The patient expressed understanding and agrees to proceed.    Chief Complaint  Patient presents with   Telephone Screen    Coughing, congestion, headache Medicine on the 5th still sick.    Telephone Assessment    HPI Randy Mclean presents for a telehealth virtual visit for respiratory infection. Patient was treated on 12/28/22 via virtual visit for possible bronchitis with 7 days of levofloxacin, steroid taper and cough medication. No xray was ordered at this time.  Patient reports that his symptoms started improving initially but never resolved and are now worsening.    Current Medication: Outpatient Encounter Medications as of 01/06/2023  Medication Sig   alfuzosin (UROXATRAL) 10 MG 24 hr tablet TAKE 1 TABLET BY MOUTH EVERYDAY AT BEDTIME   ALPRAZolam (XANAX) 0.5 MG tablet Take 1 tablet (0.5 mg total) by mouth 3 (three) times daily as needed for anxiety.   amLODipine (NORVASC) 10 MG tablet TAKE 1 TABLET BY MOUTH EVERY DAY   atorvastatin (LIPITOR) 10 MG tablet TAKE 1 TABLET BY MOUTH EVERY DAY   buPROPion (WELLBUTRIN XL) 300 MG 24 hr tablet TAKE 1 TABLET BY MOUTH DAILY, GENERIC EQUIVALENT FOR WELLBUTRIN XL. DUE FOR OFFICE VISIT.   Dietary Management Product (VB6 P5P PO) Take by mouth.   doxycycline (VIBRA-TABS) 100 MG tablet Take 1 tablet (100 mg total) by mouth 2 (two) times daily for 10 days. Take with food.    esomeprazole (NEXIUM) 40 MG capsule TAKE 1 CAPSULE (40 MG TOTAL) BY MOUTH 2 (TWO) TIMES DAILY BEFORE A MEAL.   ferrous sulfate 325 (65 FE) MG EC tablet Take 1 tablet (325 mg total) by mouth every other day.   hydrochlorothiazide (HYDRODIURIL) 12.5 MG tablet TAKE 1 TABLET BY MOUTH EVERY DAY   levothyroxine (SYNTHROID) 150 MCG tablet TAKE 1 TABLET BY MOUTH EVERY DAY BEFORE BREAKFAST   methocarbamol (ROBAXIN) 750 MG tablet Take 750 mg by mouth 4 (four) times daily as needed.   modafinil (PROVIGIL) 200 MG tablet Take 1 tablet (200 mg total) by mouth daily.   mometasone-formoterol (DULERA) 100-5 MCG/ACT AERO INHALE 2 PUFFS BY MOUTH INTO THE LUNGS EVERY 12 HOURS   Multiple Vitamin (MULTI-VITAMINS) TABS Take 1 tablet by mouth daily.   pregabalin (LYRICA) 100 MG capsule Take 1 capsule (100 mg total) by mouth every 8 (eight) hours.   promethazine-dextromethorphan (PROMETHAZINE-DM) 6.25-15 MG/5ML syrup Take 5 mLs by mouth 4 (four) times daily as needed for cough.   SYRINGE-NEEDLE, DISP, 3 ML 22G X 1" 3 ML MISC Use as directed.   tadalafil (CIALIS) 20 MG tablet Take 0.5-1 tablets (10-20 mg total) by mouth daily as needed for erectile dysfunction.   testosterone cypionate (DEPOTESTOSTERONE CYPIONATE) 200 MG/ML injection INJECT 0.75ML ONCE A WEEK AS DIRECTED   tiZANidine (ZANAFLEX) 4 MG tablet Take 4 mg by mouth every 6 (six) hours as needed for muscle spasms. Pt takes one tablet at night   valsartan (DIOVAN) 80 MG tablet Take by  mouth.   [DISCONTINUED] benzonatate (TESSALON) 200 MG capsule Take 1 capsule (200 mg total) by mouth 2 (two) times daily as needed for cough.   [DISCONTINUED] levofloxacin (LEVAQUIN) 750 MG tablet Take 1 tablet (750 mg total) by mouth daily.   [DISCONTINUED] predniSONE (STERAPRED UNI-PAK 21 TAB) 10 MG (21) TBPK tablet Use as directed for 6 days   benzonatate (TESSALON) 200 MG capsule Take 1 capsule (200 mg total) by mouth 2 (two) times daily as needed for cough.   predniSONE  (STERAPRED UNI-PAK 21 TAB) 10 MG (21) TBPK tablet Use as directed for 6 days   No facility-administered encounter medications on file as of 01/06/2023.    Surgical History: Past Surgical History:  Procedure Laterality Date   ANTERIOR CERVICAL DISCECTOMY     APPENDECTOMY     BACK SURGERY     BACK SURGERY  12/04/2020   COLONOSCOPY  09/2013   COLONOSCOPY N/A 05/09/2022   Procedure: COLONOSCOPY;  Surgeon: Jaynie Collins, DO;  Location: [facility] ENDOSCOPY;  Service: Gastroenterology;  Laterality: N/A;   elbow surgery     ESOPHAGOGASTRODUODENOSCOPY (EGD) WITH PROPOFOL N/A 05/09/2022   Procedure: ESOPHAGOGASTRODUODENOSCOPY (EGD) WITH PROPOFOL;  Surgeon: Jaynie Collins, DO;  Location: [facility] ENDOSCOPY;  Service: Gastroenterology;  Laterality: N/A;   GIVENS CAPSULE STUDY N/A 05/09/2022   Procedure: GIVENS CAPSULE STUDY;  Surgeon: Jaynie Collins, DO;  Location: [facility] ENDOSCOPY;  Service: Gastroenterology;  Laterality: N/A;   HEMORRHOID SURGERY     LAPAROSCOPIC APPENDECTOMY N/A 12/04/2017   Procedure: APPENDECTOMY LAPAROSCOPIC;  Surgeon: Ancil Linsey, MD;  Location: [facility] ORS;  Service: General;  Laterality: N/A;   LIPOMA EXCISION Right    leg   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     SPINAL CORD STIMULATOR IMPLANT     UPPER GI ENDOSCOPY  09/2013   WRIST SURGERY Left 01/14/2021    Medical History: Past Medical History:  Diagnosis Date   Acute appendicitis 12/04/2017   Allergy    takes allergy shots   Anemia    Arthritis    Asthma    Barrett esophagus    Bronchitis    Chronic pain    Colon polyp    Depression    GERD (gastroesophageal reflux disease)    Hemorrhoid    Hyperlipidemia    Hypertension    Hypothyroid    IBS (irritable bowel syndrome)    Kidney stone    MI (myocardial infarction) (HCC)    between 2002 and 2006   Migraine    Pneumonia    Seizures (HCC)    Sinus problem    Spinal cord stimulator status     Family History: Family History   Problem Relation Age of Onset   Hypertension Mother    Lung disease Mother    Heart attack Father    Diabetes Father    Liver cancer Sister    Lung cancer Sister    Colon cancer Sister    Uterine cancer Sister     Social History   Socioeconomic History   Marital status: Married    Spouse name: Not on file   Number of children: Not on file   Years of education: Not on file   Highest education level: Not on file  Occupational History   Not on file  Tobacco Use   Smoking status: Former    Current packs/day: 0.00    Average packs/day: 1 pack/day for 30.0 years (30.0 ttl pk-yrs)    Types: Cigarettes  Start date: 02/23/1975    Quit date: 02/22/2005    Years since quitting: 17.8   Smokeless tobacco: Never  Vaping Use   Vaping status: Never Used  Substance and Sexual Activity   Alcohol use: Yes    Alcohol/week: 0.0 standard drinks of alcohol    Comment: occasionally   Drug use: No    Comment: past   Sexual activity: Yes  Other Topics Concern   Not on file  Social History Narrative   Not on file   Social Determinants of Health   Financial Resource Strain: Not on file  Food Insecurity: No Food Insecurity (05/07/2022)   Hunger Vital Sign    Worried About Running Out of Food in the Last Year: Never true    Ran Out of Food in the Last Year: Never true  Transportation Needs: No Transportation Needs (05/07/2022)   PRAPARE - Administrator, Civil Service (Medical): No    Lack of Transportation (Non-Medical): No  Physical Activity: Not on file  Stress: Not on file  Social Connections: Not on file  Intimate Partner Violence: Not At Risk (05/07/2022)   Humiliation, Afraid, Rape, and Kick questionnaire    Fear of Current or Ex-Partner: No    Emotionally Abused: No    Physically Abused: No    Sexually Abused: No      Review of Systems  Constitutional:  Positive for appetite change and fatigue. Negative for chills and fever.  HENT:  Positive for congestion and  voice change.   Respiratory:  Positive for cough, chest tightness, shortness of breath and wheezing.   Cardiovascular: Negative.  Negative for chest pain and palpitations.  Musculoskeletal:  Positive for arthralgias and back pain (from coughing).    Vital Signs: Resp 16   Ht 6' (1.829 m)   Wt 205 lb (93 kg)   BMI 27.80 kg/m    Observation/Objective: He is SOB over video/audio. He is having frequent coughing fits that cause him to gasp due to the force and length of the coughing fits.     Assessment/Plan: 1. Lower respiratory infection (e.g., bronchitis, pneumonia, pneumonitis, pulmonitis) Chest xray ordered to rule out pneumonia, second antibiotic and prednisone taper also prescribed. If having difficulty breathing, patient instructed to go to ER. If no improvement with  - DG Chest 2 View; Future - doxycycline (VIBRA-TABS) 100 MG tablet; Take 1 tablet (100 mg total) by mouth 2 (two) times daily for 10 days. Take with food.  Dispense: 20 tablet; Refill: 0 - predniSONE (STERAPRED UNI-PAK 21 TAB) 10 MG (21) TBPK tablet; Use as directed for 6 days  Dispense: 21 tablet; Refill: 0  2. Persistent cough for 3 weeks or longer Cough medication ordered and chest xray - DG Chest 2 View; Future - benzonatate (TESSALON) 200 MG capsule; Take 1 capsule (200 mg total) by mouth 2 (two) times daily as needed for cough.  Dispense: 30 capsule; Refill: 0   General Counseling: Randy Mclean verbalizes understanding of the findings of today's phone visit and agrees with plan of treatment. I have discussed any further diagnostic evaluation that may be needed or ordered today. We also reviewed his medications today. he has been encouraged to call the office with any questions or concerns that should arise related to todays visit.  Return if symptoms worsen or fail to improve.   Orders Placed This Encounter  Procedures   DG Chest 2 View    Meds ordered this encounter  Medications   doxycycline (VIBRA-TABS)  100 MG tablet    Sig: Take 1 tablet (100 mg total) by mouth 2 (two) times daily for 10 days. Take with food.    Dispense:  20 tablet    Refill:  0    Please fill script asap   predniSONE (STERAPRED UNI-PAK 21 TAB) 10 MG (21) TBPK tablet    Sig: Use as directed for 6 days    Dispense:  21 tablet    Refill:  0    Fill new script today asap   benzonatate (TESSALON) 200 MG capsule    Sig: Take 1 capsule (200 mg total) by mouth 2 (two) times daily as needed for cough.    Dispense:  30 capsule    Refill:  0    Fill new script today    Time spent:10 Minutes Time spent with patient included reviewing progress notes, labs, imaging studies, and discussing plan for follow up.  [place] Controlled Substance Database was reviewed by me for overdose risk score (ORS) if appropriate.  This patient was seen by Sallyanne Kuster, FNP-C in collaboration with Dr. Beverely Risen as a part of collaborative care agreement.  Randy Mclean Sias, MSN, FNP-C Internal medicine

## 2023-01-07 LAB — PULMONARY FUNCTION TEST

## 2023-01-10 ENCOUNTER — Encounter: Payer: Self-pay | Admitting: Oncology

## 2023-01-10 ENCOUNTER — Ambulatory Visit
Admission: RE | Admit: 2023-01-10 | Discharge: 2023-01-10 | Disposition: A | Discharge status: Home / Self Care | Payer: No Typology Code available for payment source | Source: Ambulatory Visit | Attending: Nurse Practitioner

## 2023-01-10 ENCOUNTER — Inpatient Hospital Stay: Payer: No Typology Code available for payment source

## 2023-01-10 VITALS — BP 139/75 | HR 87 | Temp 99.2°F | Resp 20

## 2023-01-10 DIAGNOSIS — D508 Other iron deficiency anemias: Secondary | ICD-10-CM

## 2023-01-10 DIAGNOSIS — D509 Iron deficiency anemia, unspecified: Secondary | ICD-10-CM | POA: Diagnosis not present

## 2023-01-10 DIAGNOSIS — R053 Chronic cough: Secondary | ICD-10-CM

## 2023-01-10 DIAGNOSIS — J22 Unspecified acute lower respiratory infection: Secondary | ICD-10-CM

## 2023-01-10 DIAGNOSIS — Z23 Encounter for immunization: Secondary | ICD-10-CM

## 2023-01-10 MED ORDER — INFLUENZA VIRUS VACC SPLIT PF (FLUZONE) 0.5 ML IM SUSY
0.5000 mL | PREFILLED_SYRINGE | Freq: Once | INTRAMUSCULAR | Status: AC
  Administered 2023-01-10: 0.5 mL via INTRAMUSCULAR
  Filled 2023-01-10: qty 0.5

## 2023-01-10 MED ORDER — IRON SUCROSE 20 MG/ML IV SOLN
200.0000 mg | Freq: Once | INTRAVENOUS | Status: AC
  Administered 2023-01-10: 200 mg via INTRAVENOUS
  Filled 2023-01-10: qty 10

## 2023-01-12 ENCOUNTER — Inpatient Hospital Stay: Payer: No Typology Code available for payment source

## 2023-01-12 VITALS — BP 159/67 | HR 84 | Temp 97.9°F | Resp 18

## 2023-01-12 DIAGNOSIS — D508 Other iron deficiency anemias: Secondary | ICD-10-CM

## 2023-01-12 DIAGNOSIS — D509 Iron deficiency anemia, unspecified: Secondary | ICD-10-CM | POA: Diagnosis not present

## 2023-01-12 MED ORDER — IRON SUCROSE 20 MG/ML IV SOLN
200.0000 mg | Freq: Once | INTRAVENOUS | Status: AC
Start: 2023-01-12 — End: 2023-01-12
  Administered 2023-01-12: 200 mg via INTRAVENOUS

## 2023-01-12 NOTE — Patient Instructions (Signed)
Iron Sucrose Injection What is this medication? IRON SUCROSE (EYE ern SOO krose) treats low levels of iron (iron deficiency anemia) in people with kidney disease. Iron is a mineral that plays an important role in making red blood cells, which carry oxygen from your lungs to the rest of your body. This medicine may be used for other purposes; ask your health care provider or pharmacist if you have questions. COMMON BRAND NAME(S): Venofer What should I tell my care team before I take this medication? They need to know if you have any of these conditions: Anemia not caused by low iron levels Heart disease High levels of iron in the blood Kidney disease Liver disease An unusual or allergic reaction to iron, other medications, foods, dyes, or preservatives Pregnant or trying to get pregnant Breastfeeding How should I use this medication? This medication is for infusion into a vein. It is given in a hospital or clinic setting. Talk to your care team about the use of this medication in children. While this medication may be prescribed for children as young as 2 years for selected conditions, precautions do apply. Overdosage: If you think you have taken too much of this medicine contact a poison control center or emergency room at once. NOTE: This medicine is only for you. Do not share this medicine with others. What if I miss a dose? Keep appointments for follow-up doses. It is important not to miss your dose. Call your care team if you are unable to keep an appointment. What may interact with this medication? Do not take this medication with any of the following: Deferoxamine Dimercaprol Other iron products This medication may also interact with the following: Chloramphenicol Deferasirox This list may not describe all possible interactions. Give your health care provider a list of all the medicines, herbs, non-prescription drugs, or dietary supplements you use. Also tell them if you smoke,  drink alcohol, or use illegal drugs. Some items may interact with your medicine. What should I watch for while using this medication? Visit your care team regularly. Tell your care team if your symptoms do not start to get better or if they get worse. You may need blood work done while you are taking this medication. You may need to follow a special diet. Talk to your care team. Foods that contain iron include: whole grains/cereals, dried fruits, beans, or peas, leafy green vegetables, and organ meats (liver, kidney). What side effects may I notice from receiving this medication? Side effects that you should report to your care team as soon as possible: Allergic reactions--skin rash, itching, hives, swelling of the face, lips, tongue, or throat Low blood pressure--dizziness, feeling faint or lightheaded, blurry vision Shortness of breath Side effects that usually do not require medical attention (report to your care team if they continue or are bothersome): Flushing Headache Joint pain Muscle pain Nausea Pain, redness, or irritation at injection site This list may not describe all possible side effects. Call your doctor for medical advice about side effects. You may report side effects to FDA at 1-800-FDA-1088. Where should I keep my medication? This medication is given in a hospital or clinic. It will not be stored at home. NOTE: This sheet is a summary. It may not cover all possible information. If you have questions about this medicine, talk to your doctor, pharmacist, or health care provider.  2024 Elsevier/Gold Standard (2022-07-16 00:00:00)

## 2023-01-13 ENCOUNTER — Ambulatory Visit: Payer: No Typology Code available for payment source | Admitting: Nurse Practitioner

## 2023-01-13 ENCOUNTER — Other Ambulatory Visit: Payer: No Typology Code available for payment source

## 2023-01-13 ENCOUNTER — Encounter: Payer: Self-pay | Admitting: Nurse Practitioner

## 2023-01-13 ENCOUNTER — Telehealth: Payer: Self-pay

## 2023-01-13 VITALS — BP 138/76 | HR 89 | Temp 99.0°F | Resp 16 | Ht 72.0 in | Wt 215.6 lb

## 2023-01-13 DIAGNOSIS — N401 Enlarged prostate with lower urinary tract symptoms: Secondary | ICD-10-CM | POA: Diagnosis not present

## 2023-01-13 DIAGNOSIS — N521 Erectile dysfunction due to diseases classified elsewhere: Secondary | ICD-10-CM

## 2023-01-13 DIAGNOSIS — J432 Centrilobular emphysema: Secondary | ICD-10-CM | POA: Diagnosis not present

## 2023-01-13 DIAGNOSIS — R053 Chronic cough: Secondary | ICD-10-CM

## 2023-01-13 DIAGNOSIS — Z79899 Other long term (current) drug therapy: Secondary | ICD-10-CM

## 2023-01-13 DIAGNOSIS — Z0001 Encounter for general adult medical examination with abnormal findings: Secondary | ICD-10-CM

## 2023-01-13 MED ORDER — HYDROCHLOROTHIAZIDE 12.5 MG PO TABS: 12.5000 mg | ORAL_TABLET | Freq: Every day | ORAL | 1 refills | Status: DC

## 2023-01-13 MED ORDER — IPRATROPIUM-ALBUTEROL 0.5-2.5 (3) MG/3ML IN SOLN
3.0000 mL | Freq: Four times a day (QID) | RESPIRATORY_TRACT | 3 refills | Status: AC | PRN
Start: 2023-01-13 — End: ?

## 2023-01-13 MED ORDER — MODAFINIL 200 MG PO TABS
200.0000 mg | ORAL_TABLET | Freq: Every day | ORAL | 2 refills | Status: DC
Start: 2023-01-13 — End: 2023-04-14

## 2023-01-13 MED ORDER — ESOMEPRAZOLE MAGNESIUM 40 MG PO CPDR: 40.0000 mg | DELAYED_RELEASE_CAPSULE | Freq: Two times a day (BID) | ORAL | 1 refills | Status: DC

## 2023-01-13 MED ORDER — ALFUZOSIN HCL ER 10 MG PO TB24: ORAL_TABLET | ORAL | 1 refills | Status: DC

## 2023-01-13 MED ORDER — BREZTRI AEROSPHERE 160-9-4.8 MCG/ACT IN AERO: 2.0000 | INHALATION_SPRAY | Freq: Two times a day (BID) | RESPIRATORY_TRACT | 11 refills | Status: DC

## 2023-01-13 MED ORDER — TADALAFIL 20 MG PO TABS: 10.0000 mg | ORAL_TABLET | Freq: Every day | ORAL | 5 refills | Status: DC | PRN

## 2023-01-13 MED ORDER — ALPRAZOLAM 0.5 MG PO TABS: 0.5000 mg | ORAL_TABLET | Freq: Three times a day (TID) | ORAL | 2 refills | Status: DC | PRN

## 2023-01-13 MED ORDER — BENZONATATE 200 MG PO CAPS: 200.0000 mg | ORAL_CAPSULE | Freq: Two times a day (BID) | ORAL | 3 refills | Status: DC | PRN

## 2023-01-13 NOTE — Progress Notes (Signed)
[facility] [address] [place], [place] [address]  Internal MEDICINE  Office Visit Note  Patient Name: Randy Mclean  [id number]  [id number]  Date of Service: 01/13/2023  Chief Complaint  Patient presents with   Depression   Gastroesophageal Reflux   Hypertension   Hyperlipidemia   Annual Exam    HPI Mickal presents for an annual well visit and physical exam.  Well-appearing 62 y.o. male with  hypertension, narcolepsy, hypothyroidism, GERD, COPD, asthma, angina pectoris, bilateral carotid stenosis, carpal tunnel syndrome, chronic back pain, inflammatory polyarthritis, BPH, anxiety and depression.  lives at home with his husband and works full time.  --not due for routine screening colonoscopy now, is followed by GI for recurring GI bleed. Last PSA level is normal.  -Followed by hematology for anemia. Has had recent labs in October and August except still need to recheck cholesterol panel, testosterone, vitamin D and thyroid labs.  New or worsening pain: none  Other concerns: chronic cough continues to get worse. His dulera is no longer covered on insurance. Has not tried breztri or trelegy. Also has never had a nebulizer machine.     Current Medication: Outpatient Encounter Medications as of 01/13/2023  Medication Sig   amLODipine (NORVASC) 10 MG tablet TAKE 1 TABLET BY MOUTH EVERY DAY   atorvastatin (LIPITOR) 10 MG tablet TAKE 1 TABLET BY MOUTH EVERY DAY   Budeson-Glycopyrrol-Formoterol (BREZTRI AEROSPHERE) 160-9-4.8 MCG/ACT AERO Inhale 2 puffs into the lungs in the morning and at bedtime.   buPROPion (WELLBUTRIN XL) 300 MG 24 hr tablet TAKE 1 TABLET BY MOUTH DAILY, GENERIC EQUIVALENT FOR WELLBUTRIN XL. DUE FOR OFFICE VISIT.   Dietary Management Product (VB6 P5P PO) Take by mouth.   ferrous sulfate 325 (65 FE) MG EC tablet Take 1 tablet (325 mg total) by mouth every other day.   ipratropium-albuterol (DUONEB) 0.5-2.5 (3) MG/3ML SOLN Take 3 mLs by  nebulization every 6 (six) hours as needed (SOB/wheezing/cough).   levothyroxine (SYNTHROID) 150 MCG tablet TAKE 1 TABLET BY MOUTH EVERY DAY BEFORE BREAKFAST   methocarbamol (ROBAXIN) 750 MG tablet Take 750 mg by mouth 4 (four) times daily as needed.   Multiple Vitamin (MULTI-VITAMINS) TABS Take 1 tablet by mouth daily.   predniSONE (STERAPRED UNI-PAK 21 TAB) 10 MG (21) TBPK tablet Use as directed for 6 days   pregabalin (LYRICA) 100 MG capsule Take 1 capsule (100 mg total) by mouth every 8 (eight) hours.   promethazine-dextromethorphan (PROMETHAZINE-DM) 6.25-15 MG/5ML syrup Take 5 mLs by mouth 4 (four) times daily as needed for cough.   SYRINGE-NEEDLE, DISP, 3 ML 22G X 1" 3 ML MISC Use as directed.   testosterone cypionate (DEPOTESTOSTERONE CYPIONATE) 200 MG/ML injection INJECT 0.75ML ONCE A WEEK AS DIRECTED   tiZANidine (ZANAFLEX) 4 MG tablet Take 4 mg by mouth every 6 (six) hours as needed for muscle spasms. Pt takes one tablet at night   valsartan (DIOVAN) 80 MG tablet Take by mouth.   [DISCONTINUED] alfuzosin (UROXATRAL) 10 MG 24 hr tablet TAKE 1 TABLET BY MOUTH EVERYDAY AT BEDTIME   [DISCONTINUED] ALPRAZolam (XANAX) 0.5 MG tablet Take 1 tablet (0.5 mg total) by mouth 3 (three) times daily as needed for anxiety.   [DISCONTINUED] benzonatate (TESSALON) 200 MG capsule Take 1 capsule (200 mg total) by mouth 2 (two) times daily as needed for cough.   [DISCONTINUED] doxycycline (VIBRA-TABS) 100 MG tablet Take 1 tablet (100 mg total) by mouth 2 (two) times daily for 10 days. Take with food.   [  DISCONTINUED] esomeprazole (NEXIUM) 40 MG capsule TAKE 1 CAPSULE (40 MG TOTAL) BY MOUTH 2 (TWO) TIMES DAILY BEFORE A MEAL.   [DISCONTINUED] hydrochlorothiazide (HYDRODIURIL) 12.5 MG tablet TAKE 1 TABLET BY MOUTH EVERY DAY   [DISCONTINUED] modafinil (PROVIGIL) 200 MG tablet Take 1 tablet (200 mg total) by mouth daily.   [DISCONTINUED] mometasone-formoterol (DULERA) 100-5 MCG/ACT AERO INHALE 2 PUFFS BY MOUTH  INTO THE LUNGS EVERY 12 HOURS   [DISCONTINUED] tadalafil (CIALIS) 20 MG tablet Take 0.5-1 tablets (10-20 mg total) by mouth daily as needed for erectile dysfunction.   alfuzosin (UROXATRAL) 10 MG 24 hr tablet TAKE 1 TABLET BY MOUTH EVERYDAY AT BEDTIME   ALPRAZolam (XANAX) 0.5 MG tablet Take 1 tablet (0.5 mg total) by mouth 3 (three) times daily as needed for anxiety.   benzonatate (TESSALON) 200 MG capsule Take 1 capsule (200 mg total) by mouth 2 (two) times daily as needed for cough.   esomeprazole (NEXIUM) 40 MG capsule Take 1 capsule (40 mg total) by mouth 2 (two) times daily before a meal.   hydrochlorothiazide (HYDRODIURIL) 12.5 MG tablet Take 1 tablet (12.5 mg total) by mouth daily.   modafinil (PROVIGIL) 200 MG tablet Take 1 tablet (200 mg total) by mouth daily.   tadalafil (CIALIS) 20 MG tablet Take 0.5-1 tablets (10-20 mg total) by mouth daily as needed for erectile dysfunction.   No facility-administered encounter medications on file as of 01/13/2023.    Surgical History: Past Surgical History:  Procedure Laterality Date   ANTERIOR CERVICAL DISCECTOMY     APPENDECTOMY     BACK SURGERY     BACK SURGERY  12/04/2020   COLONOSCOPY  09/2013   COLONOSCOPY N/A 05/09/2022   Procedure: COLONOSCOPY;  Surgeon: Jaynie Collins, DO;  Location[facility] END[facility]COPY;  Service: Gastroenterology;  Laterality: N/A;   elbow surgery     ESOPHAGOGASTRODUODENOSCOPY (EGD) WITH PROPOFOL N/A 05/09/2022   Procedure: ESOPHAGOGASTRODUODENOSCOPY (EGD) WITH PROPOFOL;  Surgeon: Jaynie Collins, DO;  Location: [facility] ENDOSCOPY;  Service: Gastroenterology;  Laterality: N/A;   GIVENS CAPSULE STUDY N/A 05/09/2022   Procedure: GIVENS CAPSULE STUDY;  Surgeon: Jaynie Collins, DO;  Location: [facility] ENDOSCOPY;  Service: Gastroenterology;  Laterality: N/A;   HEMORRHOID SURGERY     LAPAROSCOPIC APPENDECTOMY N/A 12/04/2017   Procedure: APPENDECTOMY LAPAROSCOPIC;  Surgeon: Ancil Linsey, MD;  Location: [facility]  ORS;  Service: General;  Laterality: N/A;   LIPOMA EXCISION Right    leg   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     SPINAL CORD STIMULATOR IMPLANT     UPPER GI ENDOSCOPY  09/2013   WRIST SURGERY Left 01/14/2021    Medical History: Past Medical History:  Diagnosis Date   Acute appendicitis 12/04/2017   Allergy    takes allergy shots   Anemia    Arthritis    Asthma    Barrett esophagus    Bronchitis    Chronic pain    Colon polyp    Depression    GERD (gastroesophageal reflux disease)    Hemorrhoid    Hyperlipidemia    Hypertension    Hypothyroid    IBS (irritable bowel syndrome)    Kidney stone    MI (myocardial infarction) (HCC)    between 2002 and 2006   Migraine    Pneumonia    Seizures (HCC)    Sinus problem    Spinal cord stimulator status     Family History: Family History  Problem Relation Age of Onset   Hypertension Mother  Lung disease Mother    Heart attack Father    Diabetes Father    Liver cancer Sister    Lung cancer Sister    Colon cancer Sister    Uterine cancer Sister     Social History   Socioeconomic History   Marital status: Married    Spouse name: Not on file   Number of children: Not on file   Years of education: Not on file   Highest education level: Not on file  Occupational History   Not on file  Tobacco Use   Smoking status: Former    Current packs/day: 0.00    Average packs/day: 1 pack/day for 30.0 years (30.0 ttl pk-yrs)    Types: Cigarettes    Start date: 02/23/1975    Quit date: 02/22/2005    Years since quitting: 17.9   Smokeless tobacco: Never  Vaping Use   Vaping status: Never Used  Substance and Sexual Activity   Alcohol use: Yes    Alcohol/week: 0.0 standard drinks of alcohol    Comment: occasionally   Drug use: No    Comment: past   Sexual activity: Yes  Other Topics Concern   Not on file  Social History Narrative   Not on file   Social Determinants of Health   Financial Resource Strain: Not  on file  Food Insecurity: No Food Insecurity (05/07/2022)   Hunger Vital Sign    Worried About Running Out of Food in the Last Year: Never true    Ran Out of Food in the Last Year: Never true  Transportation Needs: No Transportation Needs (05/07/2022)   PRAPARE - Administrator, Civil Service (Medical): No    Lack of Transportation (Non-Medical): No  Physical Activity: Not on file  Stress: Not on file  Social Connections: Not on file  Intimate Partner Violence: Not At Risk (05/07/2022)   Humiliation, Afraid, Rape, and Kick questionnaire    Fear of Current or Ex-Partner: No    Emotionally Abused: No    Physically Abused: No    Sexually Abused: No      Review of Systems  Constitutional:  Positive for appetite change and fatigue. Negative for activity change, chills, fever and unexpected weight change.  HENT:  Positive for congestion and voice change. Negative for ear pain, rhinorrhea, sore throat and trouble swallowing.   Eyes: Negative.   Respiratory:  Positive for cough, chest tightness, shortness of breath and wheezing.   Cardiovascular: Negative.  Negative for chest pain and palpitations.  Gastrointestinal: Negative.  Negative for abdominal pain, blood in stool, constipation, diarrhea, nausea and vomiting.  Endocrine: Negative.   Genitourinary: Negative.  Negative for difficulty urinating, dysuria, frequency, hematuria and urgency.  Musculoskeletal:  Positive for arthralgias and back pain (from coughing). Negative for joint swelling, myalgias and neck pain.  Skin: Negative.  Negative for rash and wound.  Allergic/Immunologic: Negative.  Negative for immunocompromised state.  Neurological: Negative.  Negative for dizziness, seizures, numbness and headaches.  Hematological: Negative.   Psychiatric/Behavioral:  Negative for behavioral problems, self-injury and suicidal ideas. The patient is not nervous/anxious.     Vital Signs: BP 138/76   Pulse 89   Temp 99 F (37.2  C)   Resp 16   Ht 6' (1.829 m)   Wt 215 lb 9.6 oz (97.8 kg)   SpO2 94%   BMI 29.24 kg/m    Physical Exam Vitals reviewed.  Constitutional:      General: He is awake. He  is not in acute distress.    Appearance: Normal appearance. He is well-developed, well-groomed and overweight. He is ill-appearing. He is not diaphoretic.  HENT:     Head: Normocephalic and atraumatic.     Right Ear: Tympanic membrane, ear canal and external ear normal.     Left Ear: Tympanic membrane, ear canal and external ear normal.     Nose: Nose normal. No congestion or rhinorrhea.     Mouth/Throat:     Lips: Pink.     Mouth: Mucous membranes are moist.     Pharynx: Oropharynx is clear. Uvula midline. No oropharyngeal exudate or posterior oropharyngeal erythema.  Eyes:     General: Lids are normal. Vision grossly intact. Gaze aligned appropriately. No scleral icterus.       Right eye: No discharge.        Left eye: No discharge.     Extraocular Movements: Extraocular movements intact.     Conjunctiva/sclera: Conjunctivae normal.     Pupils: Pupils are equal, round, and reactive to light.     Funduscopic exam:    Right eye: Red reflex present.        Left eye: Red reflex present. Neck:     Thyroid: No thyromegaly.     Vascular: No carotid bruit or JVD.     Trachea: Trachea and phonation normal. No tracheal deviation.  Cardiovascular:     Rate and Rhythm: Normal rate and regular rhythm.     Pulses:          Carotid pulses are 3+ on the right side and 3+ on the left side.      Radial pulses are 2+ on the right side and 2+ on the left side.       Dorsalis pedis pulses are 2+ on the right side and 2+ on the left side.       Posterior tibial pulses are 2+ on the right side and 2+ on the left side.     Heart sounds: Normal heart sounds and S1 normal. No murmur heard.    No friction rub. No gallop.  Pulmonary:     Effort: Pulmonary effort is normal. No accessory muscle usage or respiratory distress.      Breath sounds: Normal breath sounds and air entry. No stridor. No wheezing or rales.  Chest:     Chest wall: No tenderness.  Abdominal:     General: Bowel sounds are normal. There is no distension.     Palpations: Abdomen is soft. There is no shifting dullness, fluid wave, mass or pulsatile mass.     Tenderness: There is no abdominal tenderness. There is no guarding or rebound.  Musculoskeletal:        General: No tenderness or deformity. Normal range of motion.     Cervical back: Normal range of motion and neck supple.     Right lower leg: No edema.     Left lower leg: No edema.  Lymphadenopathy:     Cervical: No cervical adenopathy.  Skin:    General: Skin is warm and dry.     Capillary Refill: Capillary refill takes less than 2 seconds.     Coloration: Skin is not pale.     Findings: No erythema or rash.  Neurological:     Mental Status: He is alert and oriented to person, place, and time.     Cranial Nerves: No cranial nerve deficit.     Motor: No abnormal muscle tone.  Coordination: Coordination normal.     Gait: Gait normal.     Deep Tendon Reflexes: Reflexes are normal and symmetric.  Psychiatric:        Mood and Affect: Mood and affect normal.        Behavior: Behavior normal. Behavior is cooperative.        Thought Content: Thought content normal.        Judgment: Judgment normal.        Assessment/Plan: 1. Encounter for routine adult health examination with abnormal findings Age-appropriate preventive screenings and vaccinations discussed, annual physical exam completed. Routine labs for health maintenance will be ordered. PHM updated.   2. Centrilobular emphysema (HCC) Start breztri as prescribed, refill ordered, nebulizer ordered and given to patient, duoneb treatments and benzonatate prescribed.  - Budeson-Glycopyrrol-Formoterol (BREZTRI AEROSPHERE) 160-9-4.8 MCG/ACT AERO; Inhale 2 puffs into the lungs in the morning and at bedtime.  Dispense: 10.7 g;  Refill: 11 - For home use only DME Nebulizer machine - ipratropium-albuterol (DUONEB) 0.5-2.5 (3) MG/3ML SOLN; Take 3 mLs by nebulization every 6 (six) hours as needed (SOB/wheezing/cough).  Dispense: 360 mL; Refill: 3  3. Chronic cough Start breztri as prescribed, refill ordered, nebulizer ordered and given to patient, duoneb treatments and benzonatate prescribed.  - Budeson-Glycopyrrol-Formoterol (BREZTRI AEROSPHERE) 160-9-4.8 MCG/ACT AERO; Inhale 2 puffs into the lungs in the morning and at bedtime.  Dispense: 10.7 g; Refill: 11 - For home use only DME Nebulizer machine - ipratropium-albuterol (DUONEB) 0.5-2.5 (3) MG/3ML SOLN; Take 3 mLs by nebulization every 6 (six) hours as needed (SOB/wheezing/cough).  Dispense: 360 mL; Refill: 3 - benzonatate (TESSALON) 200 MG capsule; Take 1 capsule (200 mg total) by mouth 2 (two) times daily as needed for cough.  Dispense: 30 capsule; Refill: 3  4. Benign prostatic hyperplasia with lower urinary tract symptoms, symptom details unspecified Continue alfuzosin as prescribed.  - alfuzosin (UROXATRAL) 10 MG 24 hr tablet; TAKE 1 TABLET BY MOUTH EVERYDAY AT BEDTIME  Dispense: 90 tablet; Refill: 1  5. Erectile dysfunction due to diseases classified elsewhere Continue tadalafil as prescribed.  - tadalafil (CIALIS) 20 MG tablet; Take 0.5-1 tablets (10-20 mg total) by mouth daily as needed for erectile dysfunction.  Dispense: 10 tablet; Refill: 5  6. Encounter for medication review Medication list reviewed, updated and refills ordered - ALPRAZolam (XANAX) 0.5 MG tablet; Take 1 tablet (0.5 mg total) by mouth 3 (three) times daily as needed for anxiety.  Dispense: 90 tablet; Refill: 2 - modafinil (PROVIGIL) 200 MG tablet; Take 1 tablet (200 mg total) by mouth daily.  Dispense: 30 tablet; Refill: 2 - esomeprazole (NEXIUM) 40 MG capsule; Take 1 capsule (40 mg total) by mouth 2 (two) times daily before a meal.  Dispense: 180 capsule; Refill: 1 - hydrochlorothiazide  (HYDRODIURIL) 12.5 MG tablet; Take 1 tablet (12.5 mg total) by mouth daily.  Dispense: 90 tablet; Refill: 1    General Counseling: Vontrell verbalizes understanding of the findings of todays visit and agrees with plan of treatment. I have discussed any further diagnostic evaluation that may be needed or ordered today. We also reviewed his medications today. he has been encouraged to call the office with any questions or concerns that should arise related to todays visit.    Orders Placed This Encounter  Procedures   For home use only DME Nebulizer machine    Meds ordered this encounter  Medications   Budeson-Glycopyrrol-Formoterol (BREZTRI AEROSPHERE) 160-9-4.8 MCG/ACT AERO    Sig: Inhale 2 puffs into the lungs in the  morning and at bedtime.    Dispense:  10.7 g    Refill:  11   ipratropium-albuterol (DUONEB) 0.5-2.5 (3) MG/3ML SOLN    Sig: Take 3 mLs by nebulization every 6 (six) hours as needed (SOB/wheezing/cough).    Dispense:  360 mL    Refill:  3   ALPRAZolam (XANAX) 0.5 MG tablet    Sig: Take 1 tablet (0.5 mg total) by mouth 3 (three) times daily as needed for anxiety.    Dispense:  90 tablet    Refill:  2   modafinil (PROVIGIL) 200 MG tablet    Sig: Take 1 tablet (200 mg total) by mouth daily.    Dispense:  30 tablet    Refill:  2    refills   alfuzosin (UROXATRAL) 10 MG 24 hr tablet    Sig: TAKE 1 TABLET BY MOUTH EVERYDAY AT BEDTIME    Dispense:  90 tablet    Refill:  1   benzonatate (TESSALON) 200 MG capsule    Sig: Take 1 capsule (200 mg total) by mouth 2 (two) times daily as needed for cough.    Dispense:  30 capsule    Refill:  3    Fill new script today   esomeprazole (NEXIUM) 40 MG capsule    Sig: Take 1 capsule (40 mg total) by mouth 2 (two) times daily before a meal.    Dispense:  180 capsule    Refill:  1   tadalafil (CIALIS) 20 MG tablet    Sig: Take 0.5-1 tablets (10-20 mg total) by mouth daily as needed for erectile dysfunction.    Dispense:  10  tablet    Refill:  5    New script   hydrochlorothiazide (HYDRODIURIL) 12.5 MG tablet    Sig: Take 1 tablet (12.5 mg total) by mouth daily.    Dispense:  90 tablet    Refill:  1    Return in about 4 weeks (around 02/10/2023).   Total time spent:30 Minutes Time spent includes review of chart, medications, test results, and follow up plan with the patient.   [place] Controlled Substance Database was reviewed by me.  This patient was seen by Sallyanne Kuster, FNP-C in collaboration with Dr. Beverely Risen as a part of collaborative care agreement.  Rella Egelston R. Tedd Sias, MSN, FNP-C Internal medicine

## 2023-01-13 NOTE — Telephone Encounter (Signed)
Sent message to [facility] because AA has provided a nebulizer to this patient today during his visit. She has also placed the order.

## 2023-01-15 ENCOUNTER — Other Ambulatory Visit: Payer: No Typology Code available for payment source | Attending: Medical Genetics

## 2023-01-16 ENCOUNTER — Other Ambulatory Visit: Payer: Self-pay | Admitting: Nurse Practitioner

## 2023-01-16 DIAGNOSIS — E291 Testicular hypofunction: Secondary | ICD-10-CM

## 2023-01-16 NOTE — Telephone Encounter (Signed)
Can we find out when is his last PSA ordered, I am unable to find one for this year

## 2023-01-17 ENCOUNTER — Inpatient Hospital Stay: Payer: No Typology Code available for payment source

## 2023-01-17 VITALS — BP 134/72 | HR 74 | Temp 98.1°F

## 2023-01-17 DIAGNOSIS — D508 Other iron deficiency anemias: Secondary | ICD-10-CM

## 2023-01-17 DIAGNOSIS — D509 Iron deficiency anemia, unspecified: Secondary | ICD-10-CM | POA: Diagnosis not present

## 2023-01-17 MED ORDER — IRON SUCROSE 20 MG/ML IV SOLN
200.0000 mg | Freq: Once | INTRAVENOUS | Status: AC
  Administered 2023-01-17: 200 mg via INTRAVENOUS
  Filled 2023-01-17: qty 10

## 2023-01-17 MED ORDER — SODIUM CHLORIDE 0.9% FLUSH
10.0000 mL | Freq: Once | INTRAVENOUS | Status: AC | PRN
  Administered 2023-01-17: 10 mL
  Filled 2023-01-17: qty 10

## 2023-01-17 NOTE — Telephone Encounter (Signed)
See staff message

## 2023-01-17 NOTE — Telephone Encounter (Signed)
Pt needs another PSA

## 2023-01-17 NOTE — Progress Notes (Signed)
Patient declined to wait the 30 minutes for post iron infusion observation today. Tolerated infusion well. VSS.

## 2023-01-22 ENCOUNTER — Other Ambulatory Visit: Payer: Self-pay | Admitting: Nurse Practitioner

## 2023-01-22 DIAGNOSIS — R053 Chronic cough: Secondary | ICD-10-CM

## 2023-01-24 ENCOUNTER — Inpatient Hospital Stay: Payer: No Typology Code available for payment source

## 2023-01-24 ENCOUNTER — Other Ambulatory Visit: Payer: Self-pay | Admitting: Nurse Practitioner

## 2023-01-25 LAB — TESTOSTERONE: Testosterone: 430 ng/dL (ref 264–916)

## 2023-01-25 LAB — LIPID PANEL
Chol/HDL Ratio: 3.3 {ratio} (ref 0.0–5.0)
Cholesterol, Total: 187 mg/dL (ref 100–199)
HDL: 57 mg/dL (ref >39)
LDL Chol Calc (NIH): 113 mg/dL — ABNORMAL HIGH (ref 0–99)
Triglycerides: 95 mg/dL (ref 0–149)
VLDL Cholesterol Cal: 17 mg/dL (ref 5–40)

## 2023-01-25 LAB — VITAMIN D 25 HYDROXY (VIT D DEFICIENCY, FRACTURES): Vit D, 25-Hydroxy: 41.8 ng/mL (ref 30.0–100.0)

## 2023-01-25 LAB — T4, FREE: Free T4: 1.14 ng/dL (ref 0.82–1.77)

## 2023-01-25 LAB — TSH: TSH: 0.989 u[IU]/mL (ref 0.450–4.500)

## 2023-01-27 ENCOUNTER — Inpatient Hospital Stay: Payer: No Typology Code available for payment source | Attending: Oncology

## 2023-01-27 VITALS — BP 166/82 | HR 68 | Resp 16

## 2023-01-27 DIAGNOSIS — D509 Iron deficiency anemia, unspecified: Secondary | ICD-10-CM | POA: Diagnosis present

## 2023-01-27 DIAGNOSIS — D508 Other iron deficiency anemias: Secondary | ICD-10-CM

## 2023-01-27 MED ORDER — IRON SUCROSE 20 MG/ML IV SOLN
200.0000 mg | Freq: Once | INTRAVENOUS | Status: AC
Start: 2023-01-27 — End: 2023-01-27
  Administered 2023-01-27: 200 mg via INTRAVENOUS
  Filled 2023-01-27: qty 10

## 2023-02-01 ENCOUNTER — Telehealth: Payer: Self-pay

## 2023-02-01 DIAGNOSIS — E291 Testicular hypofunction: Secondary | ICD-10-CM

## 2023-02-01 DIAGNOSIS — N521 Erectile dysfunction due to diseases classified elsewhere: Secondary | ICD-10-CM

## 2023-02-01 DIAGNOSIS — N401 Enlarged prostate with lower urinary tract symptoms: Secondary | ICD-10-CM

## 2023-02-01 NOTE — Telephone Encounter (Signed)
Patient notified to get PSA lab done prior to testosterone injections continuing. His PSA level has been gradually increasing over the past few years.  If it is stable when checked, most likely will continue with injections.  He has been on testosterone injections for several years, was originally started on them due to low testosterone and issues with acne on his back, chest, face and neck. Having his testosterone level controlled with injections has resolved the acne issues.  He has been without injection for 2 weeks and the acne is starting to develop again now.

## 2023-02-02 ENCOUNTER — Telehealth: Payer: Self-pay | Admitting: Nurse Practitioner

## 2023-02-02 NOTE — Telephone Encounter (Signed)
  General Surgery appointment 02/08/2023 @ [name]

## 2023-02-04 ENCOUNTER — Other Ambulatory Visit: Payer: Self-pay | Admitting: Nurse Practitioner

## 2023-02-05 LAB — PSA TOTAL (REFLEX TO FREE): Prostate Specific Ag, Serum: 6.5 ng/mL — ABNORMAL HIGH (ref 0.0–4.0)

## 2023-02-05 LAB — FPSA% REFLEX
% FREE PSA: 12.6 %
PSA, FREE: 0.82 ng/mL

## 2023-02-08 ENCOUNTER — Telehealth: Payer: Self-pay

## 2023-02-08 ENCOUNTER — Ambulatory Visit: Payer: Self-pay | Admitting: Surgery

## 2023-02-08 ENCOUNTER — Other Ambulatory Visit: Payer: Self-pay | Admitting: Nurse Practitioner

## 2023-02-08 DIAGNOSIS — R972 Elevated prostate specific antigen [PSA]: Secondary | ICD-10-CM

## 2023-02-08 NOTE — Telephone Encounter (Signed)
-----   Message from Sallyanne Kuster sent at 02/08/2023  4:38 AM EST ----- Please call patient and let him know that his PSA level is elevated at 6.5, I have ordered an urgent referral

## 2023-02-08 NOTE — Telephone Encounter (Signed)
Patient notified

## 2023-02-08 NOTE — Progress Notes (Signed)
Please call patient and let him know that his PSA level is elevated at 6.5, I have ordered an urgent referral

## 2023-02-08 NOTE — H&P (Signed)
Subjective:  CC: Grade II hemorrhoids [K64.1]   HPI:  Randy Mclean is a 62 y.o. male who was referrred by [facility] for above. Symptoms were first noted several years ago. he has some rectal bleeding occurring several times per week. Bleeding is described as heavy. Mostly painless.  Currently receiving IV infusion for anemia.  GI workup including capsule endoscopy negative for anything significant.   Past Medical History:  has a past medical history of Acute myocardial infarction of anterolateral wall, initial episode of care (CMS/HHS-HCC), Arthritis, Asthma without status asthmaticus (HHS-HCC) (not sure), Barrett's esophagus, BPH with obstruction/lower urinary tract symptoms (06/29/2012), Chronic low back pain, COPD (chronic obstructive pulmonary disease) (CMS/HHS-HCC), Coronary artery disease, Depression, Diverticulosis, Dyspnea (09/07/2011), GERD (gastroesophageal reflux disease), Heart disease- Mild Heart Attack, History of blood transfusion, History of colonic polyps, History of MI (myocardial infarction) (11/21/2020), History of nephrolithiasis, Hormone deficiency (2000), Hyperlipidemia, mixed (05/18/2019), Hypertension, Hypothyroidism, IDA (iron deficiency anemia) (11/21/2020), Internal hemorrhoids, Iron deficiency anemia, Male hypogonadism, Narcolepsy (HHS-HCC), Osteoarthritis of carpometacarpal (CMC) joint of left thumb, Pneumonia, Prediabetes (11/21/2020), Seasonal allergies, Seizures (CMS/HHS-HCC), Spinal cord stimulator in situ (11/20/2020), Spinal stenosis of lumbar region without neurogenic claudication (11/20/2020), Stage 3 chronic kidney disease (CMS/HHS-HCC) (11/21/2020), and Ulcer.  Past Surgical History:  has a past surgical history that includes Neuroplasty Ulnar Nerve At Elbow (Left, 03/11/2011); Paranasal Sinusotomy (1999); lumbar surgery; Spinal cord stimulator placement (02/01/2008); Colonoscopy (02/10/2000); Colonoscopy (02/31/2004); Colonoscopy (12/23/2006); Colonoscopy (10/10/2009);  egd (04/06/2002); egd (05/06/2004); egd (12/23/2006); egd (10/10/2009); Colonoscopy (09/24/2013); egd (09/24/2013); Fracture surgery (1970); Back surgery (2004 & 2010); Appendectomy (11/2017); Eye surgery (Bilateral); Colon surgery (2016); Hemorrhoidectomy Internal & External; arthrodesis anterior cervicle spine (N/A, 09/19/2018); arthrodesis anterior cervicle spine (N/A, 09/19/2018); instrumentation anterior spine 4 to 7 segments (N/A, 09/19/2018); insertion structural bone allograft for spine surgery (N/A, 09/19/2018); egd (03/21/2020); Colonoscopy (03/21/2020); laminectomy posterior lumbar facetectomy & foraminotomy w/decomp (N/A, 12/04/2020); incision for implantation/replacement spinal neurostimulator (N/A, 12/04/2020); intraoperative flouroscopy (N/A, 12/04/2020); revision wrist arthroplasty (Left, 01/14/2021); graft tendon (Left, 01/14/2021); carpectomy (Left, 01/14/2021); intraoperative flouroscopy (Left, 01/14/2021); Colon @ [facility] (05/09/2022); and EGD @ [facility] (05/09/2022).  Family History: family history includes Alcohol abuse in his mother; Anemia in his maternal grandmother and paternal grandmother; Asthma in his father, maternal grandmother, mother, and paternal uncle; Breast cancer in his paternal aunt; COPD in his mother; Colon cancer in his sister; Coronary Artery Disease (Blocked arteries around heart) in his father, paternal grandfather, and paternal grandmother; Depression in his mother; Diabetes type II in his father, maternal grandmother, paternal grandmother, sister, and sister; Heart disease in his father, paternal grandfather, and paternal grandmother; High blood pressure (Hypertension) in his maternal grandfather, maternal grandmother, mother, and paternal aunt; Hyperlipidemia (Elevated cholesterol) in his father and mother; Kidney disease in his mother; Lung cancer in his mother; Myocardial Infarction (Heart attack) in his father; Osteoarthritis in his paternal aunt, paternal aunt, and  paternal grandmother; Reflux disease in his father, mother, and paternal aunt; Rheum arthritis in his paternal aunt, paternal aunt, and paternal grandmother; Stroke in his paternal uncle.  Social History:  reports that he quit smoking about 18 years ago. His smoking use included cigarettes. He started smoking about 51 years ago. He has a 34 pack-year smoking history. He has never used smokeless tobacco. He reports current alcohol use of about 1.0 standard drink of alcohol per week. He reports that he does not use drugs.  Current Medications: has a current medication list which includes the following prescription(s): alfuzosin, alprazolam, amlodipine, atorvastatin, benzonatate, breztri aerosphere,  bupropion, esomeprazole, fexofenadine, glucosamine/chondroitin/c/mang, hydrochlorothiazide, hydrocodone-acetaminophen, hydrocodone-chlorpheniramine, ipratropium-albuterol, levofloxacin, levothyroxine, lyrica, methocarbamol, metoprolol succinate, modafinil, multivitamin, sennosides, tizanidine, valsartan, dextroamphetamine-amphetamine, and oxycodone-acetaminophen.  Allergies:  Allergies as of 02/08/2023 - Reviewed 02/08/2023  Allergen Reaction Noted   Amoxicillin Rash 08/31/2011   Neurontin [gabapentin] Unknown 08/31/2011   Penicillins Rash 08/31/2011   Adhesive Rash 11/11/2014    ROS:  A 15 point review of systems was performed and pertinent positives and negatives noted in HPI  Objective:     BP 120/73   Pulse 81   Ht 182.9 cm (6' 0.01")   Wt 97.5 kg (214 lb 15.2 oz)   BMI 29.15 kg/m   Constitutional :  No distress, cooperative, alert  Lymphatics/Throat:  Supple with no lymphadenopathy  Respiratory:  Clear to auscultation bilaterally  Cardiovascular:  Regular rate and rhythm  Gastrointestinal: Soft, non-tender, non-distended, no organomegaly.  Musculoskeletal: Steady gait and movement  Skin: Cool and moist  Psychiatric: Normal affect, non-agitated, not confused  Rectal:  External exam  normal. DRE revealed, decreased rectal tone, with palpable internal hemorrhoid noted at LL and RP portion with minimal discomfort.  After obtaining verbal consent, an anoscope was inserted after prepped with lubricant and internal hemorrhoids noted on DRE confirmed visually, with no evidence of thrombosis. No other pathology such as fissures, fistulas, polyps noted.  Moderate bleeding from hemorrhoids noted at time of exam. Scope withdrawn and patient tolerate procedure well.       LABS:  N/a   RADS: N/a Assessment:     Grade II hemorrhoids [K64.1] - moderate bleeding after exam despite relatively benign appearing mucosa over hemorrhoids.  Recommend hemorrhoidectomy rather than banding to minimize post procedure bleeding issues.  Plan:    1. Grade II hemorrhoids [K64.1] Discussed risks/benefits/alternatives to surgery.  Alternatives include the options of observation, medical management.  Benefits include symptomatic relief.  I discussed  in detail and the complications related to the operation and the anesthesia, including bleeding, infection, recurrence, remote possibility of temporary or permanent fecal incontinence, poor/delayed wound healing, chronic pain, and additional procedures to address said risks. The risks of general anesthetic, if used, includes MI, CVA, sudden death or even reaction to anesthetic medications also discussed.   We also discussed typical post operative recovery which includes weeks to potentially months of anal pain, drainage, occasional bleeding, and sense of fecal urgency.    ED return precautions given for sudden increase in pain, bleeding, with possible accompanying fever, nausea, and/or vomiting.  The patient understands the risks, any and all questions were answered to the patient's satisfaction.  2. Patient has elected to proceed with surgical treatment. Procedure will be scheduled. PT/INR ordered since could not find recent results in system.  LFTs and plts  WNL. This will be second hemorrhoidectomy for patient.  labs/images/medications/previous chart entries reviewed personally and relevant changes/updates noted above.

## 2023-02-09 ENCOUNTER — Other Ambulatory Visit: Payer: Self-pay

## 2023-02-09 DIAGNOSIS — J432 Centrilobular emphysema: Secondary | ICD-10-CM

## 2023-02-09 DIAGNOSIS — R053 Chronic cough: Secondary | ICD-10-CM

## 2023-02-09 DIAGNOSIS — N401 Enlarged prostate with lower urinary tract symptoms: Secondary | ICD-10-CM

## 2023-02-09 MED ORDER — BREZTRI AEROSPHERE 160-9-4.8 MCG/ACT IN AERO: 2.0000 | INHALATION_SPRAY | Freq: Two times a day (BID) | RESPIRATORY_TRACT | 5 refills | Status: DC

## 2023-02-09 MED ORDER — ALFUZOSIN HCL ER 10 MG PO TB24: ORAL_TABLET | ORAL | 1 refills | Status: DC

## 2023-02-10 ENCOUNTER — Ambulatory Visit (INDEPENDENT_AMBULATORY_CARE_PROVIDER_SITE_OTHER): Payer: No Typology Code available for payment source | Admitting: Nurse Practitioner

## 2023-02-10 ENCOUNTER — Encounter: Payer: Self-pay | Admitting: Nurse Practitioner

## 2023-02-10 VITALS — BP 138/80 | HR 85 | Temp 98.6°F | Resp 16 | Ht 72.0 in | Wt 211.8 lb

## 2023-02-10 DIAGNOSIS — J432 Centrilobular emphysema: Secondary | ICD-10-CM | POA: Diagnosis not present

## 2023-02-10 DIAGNOSIS — D649 Anemia, unspecified: Secondary | ICD-10-CM

## 2023-02-10 DIAGNOSIS — R972 Elevated prostate specific antigen [PSA]: Secondary | ICD-10-CM

## 2023-02-10 DIAGNOSIS — K641 Second degree hemorrhoids: Secondary | ICD-10-CM | POA: Diagnosis not present

## 2023-02-10 DIAGNOSIS — I10 Essential (primary) hypertension: Secondary | ICD-10-CM | POA: Diagnosis not present

## 2023-02-10 NOTE — Progress Notes (Cosign Needed)
[facility] [address] [place], [place] [address]  Internal MEDICINE  Office Visit Note  Patient Name: Randy Mclean Younger  [id number]  [id number]  Date of Service: 02/10/2023  Chief Complaint  Patient presents with   Depression   Gastroesophageal Reflux   Hyperlipidemia   Hypertension   Follow-up    HPI Marcquez presents for a follow-up visit for hypertension, high PSA, hemorrhoids, and COPD Hypertension -- controlled with current medications  Elevated PSA --has been referred to urology Hemorrhoids with rectal bleeding -- having hemorrhoidectomy in January and having labs done for hematology in 2 weeks. -- possibly anemia due to blood loss from bleeding hemorrhoids.  COPD -- doing much better on breztri -- less coughing, breathing improved. Also has a nebulizer and has used this a few times.      Current Medication: Outpatient Encounter Medications as of 02/10/2023  Medication Sig   alfuzosin (UROXATRAL) 10 MG 24 hr tablet TAKE 1 TABLET BY MOUTH EVERYDAY AT BEDTIME   ALPRAZolam (XANAX) 0.5 MG tablet Take 1 tablet (0.5 mg total) by mouth 3 (three) times daily as needed for anxiety.   amLODipine (NORVASC) 10 MG tablet TAKE 1 TABLET BY MOUTH EVERY DAY   atorvastatin (LIPITOR) 10 MG tablet TAKE 1 TABLET BY MOUTH EVERY DAY   benzonatate (TESSALON) 200 MG capsule Take 1 capsule (200 mg total) by mouth 2 (two) times daily as needed for cough.   Budeson-Glycopyrrol-Formoterol (BREZTRI AEROSPHERE) 160-9-4.8 MCG/ACT AERO Inhale 2 puffs into the lungs in the morning and at bedtime.   buPROPion (WELLBUTRIN XL) 300 MG 24 hr tablet TAKE 1 TABLET BY MOUTH DAILY, GENERIC EQUIVALENT FOR WELLBUTRIN XL. DUE FOR OFFICE VISIT.   Dietary Management Product (VB6 P5P PO) Take by mouth.   esomeprazole (NEXIUM) 40 MG capsule Take 1 capsule (40 mg total) by mouth 2 (two) times daily before a meal.   ferrous sulfate 325 (65 FE) MG EC tablet Take 1 tablet (325 mg total) by mouth every  other day.   hydrochlorothiazide (HYDRODIURIL) 12.5 MG tablet Take 1 tablet (12.5 mg total) by mouth daily.   ipratropium-albuterol (DUONEB) 0.5-2.5 (3) MG/3ML SOLN Take 3 mLs by nebulization every 6 (six) hours as needed (SOB/wheezing/cough).   levothyroxine (SYNTHROID) 150 MCG tablet TAKE 1 TABLET BY MOUTH EVERY DAY BEFORE BREAKFAST   methocarbamol (ROBAXIN) 750 MG tablet Take 750 mg by mouth 4 (four) times daily as needed.   modafinil (PROVIGIL) 200 MG tablet Take 1 tablet (200 mg total) by mouth daily.   Multiple Vitamin (MULTI-VITAMINS) TABS Take 1 tablet by mouth daily.   pregabalin (LYRICA) 100 MG capsule Take 1 capsule (100 mg total) by mouth every 8 (eight) hours.   promethazine-dextromethorphan (PROMETHAZINE-DM) 6.25-15 MG/5ML syrup TAKE 5 MLS BY MOUTH 4 TIMES A DAY AS NEEDED FOR COUGH   SYRINGE-NEEDLE, DISP, 3 ML 22G X 1" 3 ML MISC Use as directed.   tadalafil (CIALIS) 20 MG tablet Take 0.5-1 tablets (10-20 mg total) by mouth daily as needed for erectile dysfunction.   testosterone cypionate (DEPOTESTOSTERONE CYPIONATE) 200 MG/ML injection INJECT 0.75ML ONCE A WEEK AS DIRECTED   tiZANidine (ZANAFLEX) 4 MG tablet Take 4 mg by mouth every 6 (six) hours as needed for muscle spasms. Pt takes one tablet at night   valsartan (DIOVAN) 80 MG tablet Take by mouth.   No facility-administered encounter medications on file as of 02/10/2023.    Surgical History: Past Surgical History:  Procedure Laterality Date   ANTERIOR CERVICAL DISCECTOMY  APPENDECTOMY     BACK SURGERY     BACK SURGERY  12/04/2020   COLONOSCOPY  09/2013   COLONOSCOPY N/A 05/09/2022   Procedure: COLONOSCOPY;  Surgeon: Jaynie Collins, DO;  Location: [facility] ENDOSCOPY;  Service: Gastroenterology;  Laterality: N/A;   elbow surgery     ESOPHAGOGASTRODUODENOSCOPY (EGD) WITH PROPOFOL N/A 05/09/2022   Procedure: ESOPHAGOGASTRODUODENOSCOPY (EGD) WITH PROPOFOL;  Surgeon: Jaynie Collins, DO;  Location: [facility] ENDOSCOPY;   Service: Gastroenterology;  Laterality: N/A;   GIVENS CAPSULE STUDY N/A 05/09/2022   Procedure: GIVENS CAPSULE STUDY;  Surgeon: Jaynie Collins, DO;  Location: [facility] ENDOSCOPY;  Service: Gastroenterology;  Laterality: N/A;   HEMORRHOID SURGERY     LAPAROSCOPIC APPENDECTOMY N/A 12/04/2017   Procedure: APPENDECTOMY LAPAROSCOPIC;  Surgeon: Ancil Linsey, MD;  Location: [facility] ORS;  Service: General;  Laterality: N/A;   LIPOMA EXCISION Right    leg   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     SPINAL CORD STIMULATOR IMPLANT     UPPER GI ENDOSCOPY  09/2013   WRIST SURGERY Left 01/14/2021    Medical History: Past Medical History:  Diagnosis Date   Acute appendicitis 12/04/2017   Allergy    takes allergy shots   Anemia    Arthritis    Asthma    Barrett esophagus    Bronchitis    Chronic pain    Colon polyp    Depression    GERD (gastroesophageal reflux disease)    Hemorrhoid    Hyperlipidemia    Hypertension    Hypothyroid    IBS (irritable bowel syndrome)    Kidney stone    MI (myocardial infarction) (HCC)    between 2002 and 2006   Migraine    Pneumonia    Seizures (HCC)    Sinus problem    Spinal cord stimulator status     Family History: Family History  Problem Relation Age of Onset   Hypertension Mother    Lung disease Mother    Heart attack Father    Diabetes Father    Liver cancer Sister    Lung cancer Sister    Colon cancer Sister    Uterine cancer Sister     Social History   Socioeconomic History   Marital status: Married    Spouse name: Not on file   Number of children: Not on file   Years of education: Not on file   Highest education level: Not on file  Occupational History   Not on file  Tobacco Use   Smoking status: Former    Current packs/day: 0.00    Average packs/day: 1 pack/day for 30.0 years (30.0 ttl pk-yrs)    Types: Cigarettes    Start date: 02/23/1975    Quit date: 02/22/2005    Years since quitting: 17.9   Smokeless  tobacco: Never  Vaping Use   Vaping status: Never Used  Substance and Sexual Activity   Alcohol use: Yes    Alcohol/week: 0.0 standard drinks of alcohol    Comment: occasionally   Drug use: No    Comment: past   Sexual activity: Yes  Other Topics Concern   Not on file  Social History Narrative   Not on file   Social Drivers of Health   Financial Resource Strain: Low Risk  (02/08/2023)   Received from [facility]   Overall Financial Resource Strain (CARDIA)    Difficulty of Paying Living Expenses: Not very hard  Food Insecurity: Food Insecurity  Present (02/08/2023)   Received from [facility]   Hunger Vital Sign    Worried About Running Out of Food in the Last Year: Sometimes true    Ran Out of Food in the Last Year: Never true  Transportation Needs: No Transportation Needs (02/08/2023)   Received from [facility] - Transportation    In the past 12 months, has lack of transportation kept you from medical appointments or from getting medications?: No    Lack of Transportation (Non-Medical): No  Physical Activity: Not on file  Stress: Not on file  Social Connections: Not on file  Intimate Partner Violence: Not At Risk (05/07/2022)   Humiliation, Afraid, Rape, and Kick questionnaire    Fear of Current or Ex-Partner: No    Emotionally Abused: No    Physically Abused: No    Sexually Abused: No      Review of Systems  Constitutional:  Positive for fatigue.  Respiratory:  Positive for cough (improving). Negative for chest tightness, shortness of breath and wheezing.   Cardiovascular: Negative.  Negative for chest pain and palpitations.  Gastrointestinal: Negative.     Vital Signs: BP 138/80 Comment: 150/86  Pulse 85   Temp 98.6 F (37 C)   Resp 16   Ht 6' (1.829 m)   Wt 211 lb 12.8 oz (96.1 kg)   SpO2 97%   BMI 28.73 kg/m    Physical Exam Vitals reviewed.  Constitutional:      General: He is not  in acute distress.    Appearance: Normal appearance. He is not ill-appearing.  HENT:     Head: Normocephalic and atraumatic.  Eyes:     Pupils: Pupils are equal, round, and reactive to light.  Cardiovascular:     Rate and Rhythm: Normal rate and regular rhythm.     Heart sounds: Normal heart sounds. No murmur heard. Pulmonary:     Effort: No respiratory distress.     Breath sounds: Normal breath sounds.  Neurological:     Mental Status: He is alert and oriented to person, place, and time.     Cranial Nerves: No cranial nerve deficit.     Coordination: Coordination normal.     Gait: Gait normal.  Psychiatric:        Mood and Affect: Mood normal.        Behavior: Behavior normal.        Assessment/Plan: 1. Centrilobular emphysema (HCC) (Primary) Continue breztri inhaler as prescribed and prn neb treatments.   2. Essential hypertension Stable, continue medications as prescribed.   3. Grade II internal hemorrhoids Scheduled for hemorrhoidectomy in January.   4. Symptomatic anemia Followed by hematology  5. Elevated PSA Waiting to schedule with urology   General Counseling: Lucky Rathke understanding of the findings of todays visit and agrees with plan of treatment. I have discussed any further diagnostic evaluation that may be needed or ordered today. We also reviewed his medications today. he has been encouraged to call the office with any questions or concerns that should arise related to todays visit.    No orders of the defined types were placed in this encounter.   No orders of the defined types were placed in this encounter.   Return in about 2 months (around 04/13/2023) for F/U, Elmo Shumard PCP.   Total time spent:30 Minutes Time spent includes review of chart, medications, test results, and follow up plan with the patient.   [place] Controlled Substance Database  was reviewed by me.  This patient was seen by Sallyanne Kuster, FNP-C in collaboration with Dr.  Beverely Risen as a part of collaborative care agreement.   Damarius Karnes R. Tedd Sias, MSN, FNP-C Internal medicine

## 2023-02-11 ENCOUNTER — Encounter: Payer: Self-pay | Admitting: Nurse Practitioner

## 2023-02-11 DIAGNOSIS — K641 Second degree hemorrhoids: Secondary | ICD-10-CM | POA: Insufficient documentation

## 2023-02-14 ENCOUNTER — Other Ambulatory Visit: Payer: Self-pay

## 2023-02-14 ENCOUNTER — Other Ambulatory Visit: Payer: No Typology Code available for payment source

## 2023-02-14 DIAGNOSIS — Z79899 Other long term (current) drug therapy: Secondary | ICD-10-CM

## 2023-02-14 DIAGNOSIS — I10 Essential (primary) hypertension: Secondary | ICD-10-CM

## 2023-02-14 DIAGNOSIS — E782 Mixed hyperlipidemia: Secondary | ICD-10-CM

## 2023-02-14 MED ORDER — AMLODIPINE BESYLATE 10 MG PO TABS: 10.0000 mg | ORAL_TABLET | Freq: Every day | ORAL | 1 refills | Status: DC

## 2023-02-14 MED ORDER — ATORVASTATIN CALCIUM 10 MG PO TABS: 10.0000 mg | ORAL_TABLET | Freq: Every day | ORAL | 1 refills | Status: DC

## 2023-02-14 MED ORDER — ESOMEPRAZOLE MAGNESIUM 40 MG PO CPDR: 40.0000 mg | DELAYED_RELEASE_CAPSULE | Freq: Two times a day (BID) | ORAL | 1 refills | Status: DC

## 2023-02-17 ENCOUNTER — Ambulatory Visit: Payer: No Typology Code available for payment source | Admitting: Oncology

## 2023-02-17 ENCOUNTER — Ambulatory Visit: Payer: No Typology Code available for payment source

## 2023-02-21 ENCOUNTER — Inpatient Hospital Stay: Payer: No Typology Code available for payment source

## 2023-02-21 DIAGNOSIS — D509 Iron deficiency anemia, unspecified: Secondary | ICD-10-CM

## 2023-02-21 LAB — IRON AND TIBC
Iron: 32 ug/dL — ABNORMAL LOW (ref 45–182)
Saturation Ratios: 8 % — ABNORMAL LOW (ref 17.9–39.5)
TIBC: 396 ug/dL (ref 250–450)
UIBC: 364 ug/dL

## 2023-02-21 LAB — CBC WITH DIFFERENTIAL (CANCER CENTER ONLY)
Abs Immature Granulocytes: 0.03 10*3/uL (ref 0.00–0.07)
Basophils Absolute: 0.1 10*3/uL (ref 0.0–0.1)
Basophils Relative: 1 %
Eosinophils Absolute: 0.1 10*3/uL (ref 0.0–0.5)
Eosinophils Relative: 2 %
HCT: 37.5 % — ABNORMAL LOW (ref 39.0–52.0)
Hemoglobin: 11.3 g/dL — ABNORMAL LOW (ref 13.0–17.0)
Immature Granulocytes: 0 %
Lymphocytes Relative: 23 %
Lymphs Abs: 1.6 10*3/uL (ref 0.7–4.0)
MCH: 23.4 pg — ABNORMAL LOW (ref 26.0–34.0)
MCHC: 30.1 g/dL (ref 30.0–36.0)
MCV: 77.6 fL — ABNORMAL LOW (ref 80.0–100.0)
Monocytes Absolute: 0.6 10*3/uL (ref 0.1–1.0)
Monocytes Relative: 9 %
Neutro Abs: 4.6 10*3/uL (ref 1.7–7.7)
Neutrophils Relative %: 65 %
Platelet Count: 248 10*3/uL (ref 150–400)
RBC: 4.83 MIL/uL (ref 4.22–5.81)
RDW: 21.9 % — ABNORMAL HIGH (ref 11.5–15.5)
WBC Count: 7 10*3/uL (ref 4.0–10.5)
nRBC: 0 % (ref 0.0–0.2)

## 2023-02-21 LAB — SAMPLE TO BLOOD BANK

## 2023-02-21 LAB — FERRITIN: Ferritin: 21 ng/mL — ABNORMAL LOW (ref 24–336)

## 2023-02-22 ENCOUNTER — Inpatient Hospital Stay: Payer: No Typology Code available for payment source

## 2023-02-22 ENCOUNTER — Encounter: Payer: Self-pay | Admitting: Oncology

## 2023-02-22 ENCOUNTER — Inpatient Hospital Stay (HOSPITAL_BASED_OUTPATIENT_CLINIC_OR_DEPARTMENT_OTHER): Payer: No Typology Code available for payment source | Admitting: Oncology

## 2023-02-22 VITALS — BP 130/73 | HR 80 | Resp 18

## 2023-02-22 VITALS — BP 132/79 | HR 82 | Temp 98.4°F | Resp 16 | Ht 72.0 in | Wt 211.0 lb

## 2023-02-22 DIAGNOSIS — D508 Other iron deficiency anemias: Secondary | ICD-10-CM

## 2023-02-22 DIAGNOSIS — D509 Iron deficiency anemia, unspecified: Secondary | ICD-10-CM

## 2023-02-22 MED ORDER — SODIUM CHLORIDE 0.9% FLUSH
10.0000 mL | Freq: Once | INTRAVENOUS | Status: AC | PRN
Start: 2023-02-22 — End: 2023-02-22
  Administered 2023-02-22: 10 mL
  Filled 2023-02-22: qty 10

## 2023-02-22 MED ORDER — IRON SUCROSE 20 MG/ML IV SOLN
200.0000 mg | Freq: Once | INTRAVENOUS | Status: AC
Start: 2023-02-22 — End: 2023-02-22
  Administered 2023-02-22: 200 mg via INTRAVENOUS
  Filled 2023-02-22: qty 10

## 2023-02-22 NOTE — Progress Notes (Signed)
Still feeling tired and having dizziness.

## 2023-02-22 NOTE — Progress Notes (Signed)
 [facility]  Telephone:(336) [phone] Fax:(336) [phone]  ID: Randy Mclean OB: 07-Apr-1960  MR#: [id number]  RDW#:[id number]  Patient Care Team: Liana Fish, NP as PCP - General (Nurse Practitioner) Fernand Sigrid HERO, MD (Internal Medicine) Jacobo Randy PARAS, MD as Consulting Physician (Oncology)  CHIEF COMPLAINT: Iron  deficiency anemia.  INTERVAL HISTORY: Patient returns to clinic today for repeat laboratory, further evaluation, and continuation of IV Venofer .  He continues to have weakness and fatigue as well as occasional dizziness, but also states he discontinued his testosterone  supplement recently which may be contributing.  He otherwise feels well. He has no other neurologic complaints.  He denies any recent fevers or illnesses.  He has no chest pain, shortness of breath, cough, or hemoptysis.  He denies any nausea, vomiting, constipation, or diarrhea.  He has no melena but has noticed bright red blood per rectum.  He has no urinary complaints.  Patient offers no further specific complaints today.  REVIEW OF SYSTEMS:   Review of Systems  Constitutional:  Positive for malaise/fatigue. Negative for fever and weight loss.  Respiratory: Negative.  Negative for cough and shortness of breath.   Cardiovascular: Negative.  Negative for chest pain and leg swelling.  Gastrointestinal:  Positive for blood in stool. Negative for abdominal pain and melena.  Genitourinary: Negative.  Negative for hematuria.  Musculoskeletal: Negative.  Negative for back pain and neck pain.  Skin: Negative.  Negative for rash.  Neurological:  Positive for dizziness and weakness. Negative for sensory change, focal weakness and headaches.  Psychiatric/Behavioral: Negative.  The patient is not nervous/anxious.     As per HPI. Otherwise, a complete review of systems is negative.  PAST MEDICAL HISTORY: Past Medical History:  Diagnosis Date   Acute appendicitis 12/04/2017   Allergy     takes allergy shots   Anemia    Arthritis    Asthma    Barrett esophagus    Bronchitis    Chronic pain    Colon polyp    Depression    GERD (gastroesophageal reflux disease)    Hemorrhoid    Hyperlipidemia    Hypertension    Hypothyroid    IBS (irritable bowel syndrome)    Kidney stone    MI (myocardial infarction) (HCC)    between 2002 and 2006   Migraine    Pneumonia    Seizures (HCC)    Sinus problem    Spinal cord stimulator status     PAST SURGICAL HISTORY: Past Surgical History:  Procedure Laterality Date   ANTERIOR CERVICAL DISCECTOMY     APPENDECTOMY     BACK SURGERY     BACK SURGERY  12/04/2020   COLONOSCOPY  09/2013   COLONOSCOPY N/A 05/09/2022   Procedure: COLONOSCOPY;  Surgeon: Onita Elspeth Sharper, DO;  Location: [facility] ENDOSCOPY;  Service: Gastroenterology;  Laterality: N/A;   elbow surgery     ESOPHAGOGASTRODUODENOSCOPY (EGD) WITH PROPOFOL  N/A 05/09/2022   Procedure: ESOPHAGOGASTRODUODENOSCOPY (EGD) WITH PROPOFOL ;  Surgeon: Onita Elspeth Sharper, DO;  Location: [facility] ENDOSCOPY;  Service: Gastroenterology;  Laterality: N/A;   GIVENS CAPSULE STUDY N/A 05/09/2022   Procedure: GIVENS CAPSULE STUDY;  Surgeon: Onita Elspeth Sharper, DO;  Location: [facility] ENDOSCOPY;  Service: Gastroenterology;  Laterality: N/A;   HEMORRHOID SURGERY     LAPAROSCOPIC APPENDECTOMY N/A 12/04/2017   Procedure: APPENDECTOMY LAPAROSCOPIC;  Surgeon: Nicholaus Selinda Birmingham, MD;  Location: [facility] ORS;  Service: General;  Laterality: N/A;   LIPOMA EXCISION Right    leg   multiple  leg surgery to remove angiolipoma     SPINAL CORD STIMULATOR IMPLANT     UPPER GI ENDOSCOPY  09/2013   WRIST SURGERY Left 01/14/2021    FAMILY HISTORY Family History  Problem Relation Age of Onset   Hypertension Mother    Lung disease Mother    Heart attack Father    Diabetes Father    Liver cancer Sister    Lung cancer Sister    Colon cancer Sister    Uterine cancer Sister        ADVANCED DIRECTIVES:     HEALTH MAINTENANCE: Social History   Tobacco Use   Smoking status: Former    Current packs/day: 0.00    Average packs/day: 1 pack/day for 30.0 years (30.0 ttl pk-yrs)    Types: Cigarettes    Start date: 02/23/1975    Quit date: 02/22/2005    Years since quitting: 18.0   Smokeless tobacco: Never  Vaping Use   Vaping status: Never Used  Substance Use Topics   Alcohol use: Yes    Alcohol/week: 0.0 standard drinks of alcohol    Comment: occasionally   Drug use: No    Comment: past     Colonoscopy:  PAP:  Bone density:  Lipid panel:  Allergies  Allergen Reactions   Amoxicillin Anaphylaxis    REACTION: unspecified   Penicillin G Anaphylaxis   Gabapentin Other (See Comments)    REACTION: anaphylaxis    Current Outpatient Medications  Medication Sig Dispense Refill   alfuzosin  (UROXATRAL ) 10 MG 24 hr tablet TAKE 1 TABLET BY MOUTH EVERYDAY AT BEDTIME 90 tablet 1   ALPRAZolam  (XANAX ) 0.5 MG tablet Take 1 tablet (0.5 mg total) by mouth 3 (three) times daily as needed for anxiety. 90 tablet 2   amLODipine  (NORVASC ) 10 MG tablet Take 1 tablet (10 mg total) by mouth daily. 90 tablet 1   atorvastatin  (LIPITOR) 10 MG tablet Take 1 tablet (10 mg total) by mouth daily. 90 tablet 1   benzonatate  (TESSALON ) 200 MG capsule Take 1 capsule (200 mg total) by mouth 2 (two) times daily as needed for cough. 30 capsule 3   Budeson-Glycopyrrol-Formoterol  (BREZTRI  AEROSPHERE) 160-9-4.8 MCG/ACT AERO Inhale 2 puffs into the lungs in the morning and at bedtime. 10.7 g 5   buPROPion  (WELLBUTRIN  XL) 300 MG 24 hr tablet TAKE 1 TABLET BY MOUTH DAILY, GENERIC EQUIVALENT FOR WELLBUTRIN  XL. DUE FOR OFFICE VISIT. 90 tablet 1   Dietary Management Product (VB6 P5P PO) Take by mouth.     esomeprazole  (NEXIUM ) 40 MG capsule Take 1 capsule (40 mg total) by mouth 2 (two) times daily before a meal. 180 capsule 1   ferrous sulfate  325 (65 FE) MG EC tablet Take 1 tablet (325 mg total) by mouth every other day. 45  tablet 1   hydrochlorothiazide  (HYDRODIURIL ) 12.5 MG tablet Take 1 tablet (12.5 mg total) by mouth daily. 90 tablet 1   ipratropium-albuterol  (DUONEB) 0.5-2.5 (3) MG/3ML SOLN Take 3 mLs by nebulization every 6 (six) hours as needed (SOB/wheezing/cough). 360 mL 3   levothyroxine  (SYNTHROID ) 150 MCG tablet TAKE 1 TABLET BY MOUTH EVERY DAY BEFORE BREAKFAST 90 tablet 1   methocarbamol  (ROBAXIN ) 750 MG tablet Take 750 mg by mouth 4 (four) times daily as needed.     modafinil  (PROVIGIL ) 200 MG tablet Take 1 tablet (200 mg total) by mouth daily. 30 tablet 2   Multiple Vitamin (MULTI-VITAMINS) TABS Take 1 tablet by mouth daily.     pregabalin  (LYRICA ) 100 MG  capsule Take 1 capsule (100 mg total) by mouth every 8 (eight) hours. 10 capsule 0   promethazine -dextromethorphan (PROMETHAZINE -DM) 6.25-15 MG/5ML syrup TAKE 5 MLS BY MOUTH 4 TIMES A DAY AS NEEDED FOR COUGH 180 mL 1   SYRINGE-NEEDLE, DISP, 3 ML 22G X 1 3 ML MISC Use as directed.     testosterone  cypionate (DEPOTESTOSTERONE CYPIONATE) 200 MG/ML injection INJECT 0.75ML ONCE A WEEK AS DIRECTED 10 mL 0   tiZANidine  (ZANAFLEX ) 4 MG tablet Take 4 mg by mouth every 6 (six) hours as needed for muscle spasms. Pt takes one tablet at night     valsartan  (DIOVAN ) 80 MG tablet Take by mouth.     No current facility-administered medications for this visit.    OBJECTIVE: Vitals:   02/22/23 1349  BP: 132/79  Pulse: 82  Resp: 16  Temp: 98.4 F (36.9 C)  SpO2: 100%       Body mass index is 28.62 kg/m.    ECOG FS:1 - Symptomatic but completely ambulatory  General: Well-developed, well-nourished, no acute distress. Eyes: Pink conjunctiva, anicteric sclera. HEENT: Normocephalic, moist mucous membranes. Lungs: No audible wheezing or coughing. Heart: Regular rate and rhythm. Abdomen: Soft, nontender, no obvious distention. Musculoskeletal: No edema, cyanosis, or clubbing. Neuro: Alert, answering all questions appropriately. Cranial nerves grossly  intact. Skin: No rashes or petechiae noted. Psych: Normal affect.  LAB RESULTS:  Lab Results  Component Value Date   NA 137 10/18/2022   K 3.5 10/18/2022   CL 102 10/18/2022   CO2 28 10/18/2022   GLUCOSE 97 10/18/2022   BUN 9 10/18/2022   CREATININE 1.14 10/18/2022   CALCIUM  8.8 (L) 10/18/2022   PROT 7.0 10/18/2022   ALBUMIN 4.1 10/18/2022   AST 18 10/18/2022   ALT 20 10/18/2022   ALKPHOS 64 10/18/2022   BILITOT 0.4 10/18/2022   GFRNONAA >60 10/18/2022   GFRAA >60 05/05/2019    Lab Results  Component Value Date   WBC 7.0 02/21/2023   NEUTROABS 4.6 02/21/2023   HGB 11.3 (L) 02/21/2023   HCT 37.5 (L) 02/21/2023   MCV 77.6 (L) 02/21/2023   PLT 248 02/21/2023   Lab Results  Component Value Date   IRON  32 (L) 02/21/2023   TIBC 396 02/21/2023   IRONPCTSAT 8 (L) 02/21/2023   Lab Results  Component Value Date   FERRITIN 21 (L) 02/21/2023     STUDIES: No results found.  ASSESSMENT: Iron  deficiency anemia.  PLAN:    Iron  deficiency anemia: EGD and colonoscopy on May 09, 2022 did not reveal any significant pathology.  Patient also had capsule endoscopy, but these results are not available at this time.  Patient has had an appointment with surgery and plans to do a hemorrhoidectomy in mid January.  His hemoglobin has significantly improved, but his iron  stores remain decreased and he is symptomatic.  Proceed with IV Venofer  today.  Return to clinic 4 times over the next 2 to 3 weeks for treatment only.  Patient will then return to clinic in mid February after his surgery for repeat laboratory work, further evaluation, and consideration of additional IV Venofer  if necessary.   GI bleed: Hemorrhoidectomy as above. Family history of colon cancer:  Patient gets routine colonoscopies given his family history of colon cancer, all of which been unrevealing.  Unclear when his last colonoscopy was completed.  His genetic testing is negative. Dizziness/weakness/fatigue: Likely  multifactorial secondary to discontinuing testosterone  supplementation as well as iron  deficiency anemia.   I spent a  total of 30 minutes reviewing chart data, face-to-face evaluation with the patient, counseling and coordination of care as detailed above.   Patient expressed understanding and was in agreement with this plan. He also understands that He can call clinic at any time with any questions, concerns, or complaints.    Randy JINNY Reusing, MD   02/22/2023 2:08 PM

## 2023-02-27 ENCOUNTER — Encounter: Payer: Self-pay | Admitting: Urgent Care

## 2023-02-28 ENCOUNTER — Inpatient Hospital Stay: Payer: No Typology Code available for payment source | Attending: Oncology

## 2023-02-28 VITALS — BP 125/77 | HR 85 | Temp 97.5°F | Resp 18

## 2023-02-28 DIAGNOSIS — D509 Iron deficiency anemia, unspecified: Secondary | ICD-10-CM | POA: Insufficient documentation

## 2023-02-28 DIAGNOSIS — Z79899 Other long term (current) drug therapy: Secondary | ICD-10-CM | POA: Diagnosis not present

## 2023-02-28 DIAGNOSIS — D508 Other iron deficiency anemias: Secondary | ICD-10-CM

## 2023-02-28 MED ORDER — IRON SUCROSE 20 MG/ML IV SOLN
200.0000 mg | Freq: Once | INTRAVENOUS | Status: AC
Start: 2023-02-28 — End: 2023-02-28
  Administered 2023-02-28: 200 mg via INTRAVENOUS

## 2023-02-28 NOTE — Patient Instructions (Signed)
 Iron Sucrose Injection What is this medication? IRON SUCROSE (EYE ern SOO krose) treats low levels of iron (iron deficiency anemia) in people with kidney disease. Iron is a mineral that plays an important role in making red blood cells, which carry oxygen from your lungs to the rest of your body. This medicine may be used for other purposes; ask your health care provider or pharmacist if you have questions. COMMON BRAND NAME(S): Venofer What should I tell my care team before I take this medication? They need to know if you have any of these conditions: Anemia not caused by low iron levels Heart disease High levels of iron in the blood Kidney disease Liver disease An unusual or allergic reaction to iron, other medications, foods, dyes, or preservatives Pregnant or trying to get pregnant Breastfeeding How should I use this medication? This medication is for infusion into a vein. It is given in a hospital or clinic setting. Talk to your care team about the use of this medication in children. While this medication may be prescribed for children as young as 2 years for selected conditions, precautions do apply. Overdosage: If you think you have taken too much of this medicine contact a poison control center or emergency room at once. NOTE: This medicine is only for you. Do not share this medicine with others. What if I miss a dose? Keep appointments for follow-up doses. It is important not to miss your dose. Call your care team if you are unable to keep an appointment. What may interact with this medication? Do not take this medication with any of the following: Deferoxamine Dimercaprol Other iron products This medication may also interact with the following: Chloramphenicol Deferasirox This list may not describe all possible interactions. Give your health care provider a list of all the medicines, herbs, non-prescription drugs, or dietary supplements you use. Also tell them if you smoke,  drink alcohol, or use illegal drugs. Some items may interact with your medicine. What should I watch for while using this medication? Visit your care team regularly. Tell your care team if your symptoms do not start to get better or if they get worse. You may need blood work done while you are taking this medication. You may need to follow a special diet. Talk to your care team. Foods that contain iron include: whole grains/cereals, dried fruits, beans, or peas, leafy green vegetables, and organ meats (liver, kidney). What side effects may I notice from receiving this medication? Side effects that you should report to your care team as soon as possible: Allergic reactions--skin rash, itching, hives, swelling of the face, lips, tongue, or throat Low blood pressure--dizziness, feeling faint or lightheaded, blurry vision Shortness of breath Side effects that usually do not require medical attention (report to your care team if they continue or are bothersome): Flushing Headache Joint pain Muscle pain Nausea Pain, redness, or irritation at injection site This list may not describe all possible side effects. Call your doctor for medical advice about side effects. You may report side effects to FDA at 1-800-FDA-1088. Where should I keep my medication? This medication is given in a hospital or clinic. It will not be stored at home. NOTE: This sheet is a summary. It may not cover all possible information. If you have questions about this medicine, talk to your doctor, pharmacist, or health care provider.  2024 Elsevier/Gold Standard (2022-07-16 00:00:00)

## 2023-03-03 ENCOUNTER — Inpatient Hospital Stay: Payer: No Typology Code available for payment source

## 2023-03-03 VITALS — BP 145/81 | HR 73 | Temp 98.1°F | Resp 16

## 2023-03-03 DIAGNOSIS — D509 Iron deficiency anemia, unspecified: Secondary | ICD-10-CM | POA: Diagnosis not present

## 2023-03-03 DIAGNOSIS — D508 Other iron deficiency anemias: Secondary | ICD-10-CM

## 2023-03-03 MED ORDER — SODIUM CHLORIDE 0.9% FLUSH
10.0000 mL | Freq: Once | INTRAVENOUS | Status: DC | PRN
Start: 2023-03-03 — End: 2023-03-03
  Filled 2023-03-03: qty 10

## 2023-03-03 MED ORDER — IRON SUCROSE 20 MG/ML IV SOLN
200.0000 mg | Freq: Once | INTRAVENOUS | Status: AC
  Administered 2023-03-03: 200 mg via INTRAVENOUS
  Filled 2023-03-03: qty 10

## 2023-03-03 NOTE — Progress Notes (Signed)
 Declined post-observation. Aware of risks. Vitals stable.

## 2023-03-08 ENCOUNTER — Inpatient Hospital Stay: Payer: No Typology Code available for payment source

## 2023-03-08 VITALS — BP 132/59 | HR 87 | Temp 98.2°F | Resp 20

## 2023-03-08 DIAGNOSIS — D508 Other iron deficiency anemias: Secondary | ICD-10-CM

## 2023-03-08 DIAGNOSIS — D509 Iron deficiency anemia, unspecified: Secondary | ICD-10-CM | POA: Diagnosis not present

## 2023-03-08 MED ORDER — IRON SUCROSE 20 MG/ML IV SOLN
200.0000 mg | Freq: Once | INTRAVENOUS | Status: AC
  Administered 2023-03-08: 200 mg via INTRAVENOUS
  Filled 2023-03-08: qty 10

## 2023-03-08 MED ORDER — SODIUM CHLORIDE 0.9% FLUSH
10.0000 mL | Freq: Once | INTRAVENOUS | Status: AC | PRN
Start: 2023-03-08 — End: 2023-03-08
  Administered 2023-03-08: 10 mL
  Filled 2023-03-08: qty 10

## 2023-03-10 ENCOUNTER — Inpatient Hospital Stay: Admission: RE | Admit: 2023-03-10 | Payer: No Typology Code available for payment source | Source: Ambulatory Visit

## 2023-03-10 ENCOUNTER — Inpatient Hospital Stay: Payer: No Typology Code available for payment source

## 2023-03-14 ENCOUNTER — Inpatient Hospital Stay: Payer: No Typology Code available for payment source

## 2023-03-14 ENCOUNTER — Inpatient Hospital Stay
Admission: RE | Admit: 2023-03-14 | Discharge: 2023-03-14 | Disposition: A | Discharge status: Home / Self Care | Payer: No Typology Code available for payment source | Source: Ambulatory Visit

## 2023-03-14 HISTORY — DX: Narcolepsy without cataplexy: G47.419

## 2023-03-14 HISTORY — DX: Diverticulosis of intestine, part unspecified, without perforation or abscess without bleeding: K57.90

## 2023-03-14 HISTORY — DX: Benign prostatic hyperplasia without lower urinary tract symptoms: N40.0

## 2023-03-14 HISTORY — DX: Personal history of urinary calculi: Z87.442

## 2023-03-14 HISTORY — DX: Chronic obstructive pulmonary disease, unspecified: J44.9

## 2023-03-14 HISTORY — DX: Personal history of nicotine dependence: Z87.891

## 2023-03-14 HISTORY — DX: Atherosclerotic heart disease of native coronary artery without angina pectoris: I25.10

## 2023-03-14 HISTORY — DX: Prediabetes: R73.03

## 2023-03-14 HISTORY — DX: Chronic kidney disease, stage 3 unspecified: N18.30

## 2023-03-14 HISTORY — DX: Occlusion and stenosis of bilateral carotid arteries: I65.23

## 2023-03-14 HISTORY — DX: Spinal stenosis, lumbar region without neurogenic claudication: M48.061

## 2023-03-15 NOTE — Pre-Procedure Instructions (Signed)
Patient called PAT to inform that he is running a fever of 101-102, want to postpone his surgery with Dr. Tonna Boehringer. Advised that he needs to call his office and let them know. I contact the office to let them know but he needs to call them as well. Patient verbalized understanding information given.

## 2023-03-16 ENCOUNTER — Inpatient Hospital Stay: Admission: RE | Admit: 2023-03-16 | Payer: No Typology Code available for payment source | Source: Ambulatory Visit

## 2023-03-16 ENCOUNTER — Encounter: Payer: Self-pay | Admitting: Oncology

## 2023-03-18 ENCOUNTER — Ambulatory Visit
Admission: RE | Admit: 2023-03-18 | Payer: No Typology Code available for payment source | Source: Home / Self Care | Admitting: Surgery

## 2023-03-18 ENCOUNTER — Encounter: Admission: RE | Payer: Self-pay | Source: Home / Self Care

## 2023-03-18 SURGERY — HEMORRHOIDECTOMY
Anesthesia: General | Site: Buttocks

## 2023-03-19 ENCOUNTER — Other Ambulatory Visit: Payer: Self-pay | Admitting: Nurse Practitioner

## 2023-03-19 DIAGNOSIS — J22 Unspecified acute lower respiratory infection: Secondary | ICD-10-CM

## 2023-03-19 DIAGNOSIS — R053 Chronic cough: Secondary | ICD-10-CM

## 2023-03-23 ENCOUNTER — Ambulatory Visit: Payer: No Typology Code available for payment source | Admitting: Urology

## 2023-03-23 VITALS — BP 120/73 | HR 74 | Ht 72.0 in | Wt 208.0 lb

## 2023-03-23 DIAGNOSIS — R972 Elevated prostate specific antigen [PSA]: Secondary | ICD-10-CM

## 2023-03-23 DIAGNOSIS — N401 Enlarged prostate with lower urinary tract symptoms: Secondary | ICD-10-CM

## 2023-03-23 DIAGNOSIS — E291 Testicular hypofunction: Secondary | ICD-10-CM | POA: Diagnosis not present

## 2023-03-23 MED ORDER — ALFUZOSIN HCL ER 10 MG PO TB24: 10.0000 mg | ORAL_TABLET | Freq: Two times a day (BID) | ORAL | 0 refills | Status: DC

## 2023-03-25 ENCOUNTER — Other Ambulatory Visit: Payer: Self-pay | Admitting: Nurse Practitioner

## 2023-03-25 ENCOUNTER — Encounter: Payer: Self-pay | Admitting: Urology

## 2023-03-25 DIAGNOSIS — E039 Hypothyroidism, unspecified: Secondary | ICD-10-CM

## 2023-03-25 MED ORDER — TESTOSTERONE CYPIONATE 200 MG/ML IM SOLN: 140.0000 mg | INTRAMUSCULAR | 0 refills | Status: DC

## 2023-03-25 NOTE — Progress Notes (Signed)
03/23/2023 8:04 AM   Randy Mclean 10/18/1960 [id number]  Referring provider: Sallyanne Kuster, NP [address] [place],  [place] [address]  Chief Complaint  Patient presents with   Elevated PSA    HPI: Randy Mclean Mclean is a 63 y.o. male referred for evaluation of an elevated PSA.  PSA drawn 02/04/2023 was elevated at 6.5. Baseline PSA from 2019 until 2022 was in the mid 1-low 2 range and was 3.0 March 2024 Over the last 6 months he has had worsening lower urinary tract symptoms including urinary frequency, hesitancy, decreased force and caliber of his urinary stream.  He is on alfuzosin 10 mg daily for the last several years Also on TRT for several years initially prescribed by Dr. Achilles Dunk which was discontinued at the time of his elevated PSA.  He is complaining of significant tiredness and fatigue since being off of his testosterone No family history prostate cancer Denies recurrent UTI, dysuria or gross hematuria   PMH: Past Medical History:  Diagnosis Date   Acute appendicitis 12/04/2017   Allergy    takes allergy shots   Anemia    Arthritis    Asthma    Barrett esophagus    Bilateral carotid artery stenosis    BPH (benign prostatic hyperplasia)    Bronchitis    Chronic pain    CKD (chronic kidney disease) stage 3, GFR 30-59 ml/min (HCC)    Colon polyp    COPD (chronic obstructive pulmonary disease) (HCC)    Coronary artery disease    Depression    Diverticulosis    Former smoker    GERD (gastroesophageal reflux disease)    Hemorrhoid    History of kidney stones    Hyperlipidemia    Hypertension    Hypothyroid    IBS (irritable bowel syndrome)    Kidney stone    MI (myocardial infarction) (HCC)    between 2002 and 2006   Migraine    Narcolepsy    Pneumonia    Pre-diabetes    Seizures (HCC)    Sinus problem    Spinal cord stimulator status    Spinal stenosis of lumbar region     Surgical History: Past Surgical History:  Procedure  Laterality Date   ANTERIOR CERVICAL DISCECTOMY     APPENDECTOMY     BACK SURGERY     BACK SURGERY  12/04/2020   COLONOSCOPY  09/2013   COLONOSCOPY N/A 05/09/2022   Procedure: COLONOSCOPY;  Surgeon: Jaynie Collins, DO;  Location[facility] ENDOSCOPY;  Service: Gastroenterology;  Laterality: N/A;   elbow surgery     ESOPHAGOGASTRODUODENOSCOPY (EGD) WITH PROPOFOL N/A 05/09/2022   Procedure: ESOPHAGOGASTRODUODENOSCOPY (EGD) WITH PROPOFOL;  Surgeon: Jaynie Collins, DO;  Location: [facility] ENDOSCOPY;  Service: Gastroenterology;  Laterality: N/A;   GIVENS CAPSULE STUDY N/A 05/09/2022   Procedure: GIVENS CAPSULE STUDY;  Surgeon: Jaynie Collins, DO;  Location: [facility] ENDOSCOPY;  Service: Gastroenterology;  Laterality: N/A;   HEMORRHOID SURGERY     LAPAROSCOPIC APPENDECTOMY N/A 12/04/2017   Procedure: APPENDECTOMY LAPAROSCOPIC;  Surgeon: Ancil Linsey, MD;  Location: [facility] ORS;  Service: General;  Laterality: N/A;   LIPOMA EXCISION Right    leg   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     SPINAL CORD STIMULATOR IMPLANT     UPPER GI ENDOSCOPY  09/2013   WRIST SURGERY Left 01/14/2021    Home Medications:  Allergies as of 03/23/2023       Reactions   Amoxicillin Anaphylaxis  REACTION: unspecified   Penicillin G Anaphylaxis   Gabapentin Other (See Comments)   REACTION: anaphylaxis        Medication List        Accurate as of March 23, 2023 11:59 PM. If you have any questions, ask your nurse or doctor.          STOP taking these medications    ferrous sulfate 325 (65 FE) MG EC tablet   promethazine-dextromethorphan 6.25-15 MG/5ML syrup Commonly known as: PROMETHAZINE-DM   testosterone cypionate 200 MG/ML injection Commonly known as: DEPOTESTOSTERONE CYPIONATE       TAKE these medications    alfuzosin 10 MG 24 hr tablet Commonly known as: UROXATRAL Take 1 tablet (10 mg total) by mouth in the morning and at bedtime. What changed:  how much to take how  to take this when to take this additional instructions   ALPRAZolam 0.5 MG tablet Commonly known as: XANAX Take 1 tablet (0.5 mg total) by mouth 3 (three) times daily as needed for anxiety.   amLODipine 10 MG tablet Commonly known as: NORVASC Take 1 tablet (10 mg total) by mouth daily.   atorvastatin 10 MG tablet Commonly known as: LIPITOR Take 1 tablet (10 mg total) by mouth daily.   benzonatate 200 MG capsule Commonly known as: TESSALON Take 1 capsule (200 mg total) by mouth 2 (two) times daily as needed for cough. What changed: when to take this   Breztri Aerosphere 160-9-4.8 MCG/ACT Aero Generic drug: Budeson-Glycopyrrol-Formoterol Inhale 2 puffs into the lungs in the morning and at bedtime.   buPROPion 300 MG 24 hr tablet Commonly known as: WELLBUTRIN XL TAKE 1 TABLET BY MOUTH DAILY, GENERIC EQUIVALENT FOR WELLBUTRIN XL. DUE FOR OFFICE VISIT.   esomeprazole 40 MG capsule Commonly known as: NEXIUM Take 1 capsule (40 mg total) by mouth 2 (two) times daily before a meal.   fexofenadine 180 MG tablet Commonly known as: ALLEGRA Take 180 mg by mouth daily.   FIBER GUMMIES PO Take 2 tablets by mouth daily at 2 am.   hydrochlorothiazide 12.5 MG tablet Commonly known as: HYDRODIURIL Take 1 tablet (12.5 mg total) by mouth daily.   ipratropium-albuterol 0.5-2.5 (3) MG/3ML Soln Commonly known as: DUONEB Take 3 mLs by nebulization every 6 (six) hours as needed (SOB/wheezing/cough).   levothyroxine 150 MCG tablet Commonly known as: SYNTHROID TAKE 1 TABLET BY MOUTH EVERY DAY BEFORE BREAKFAST   methocarbamol 750 MG tablet Commonly known as: ROBAXIN Take 750 mg by mouth 4 (four) times daily as needed for muscle spasms.   metoprolol succinate 25 MG 24 hr tablet Commonly known as: TOPROL-XL Take 25 mg by mouth daily.   modafinil 200 MG tablet Commonly known as: PROVIGIL Take 1 tablet (200 mg total) by mouth daily.   Multi-Vitamins Tabs Take 1 tablet by mouth  daily.   pregabalin 100 MG capsule Commonly known as: LYRICA Take 1 capsule (100 mg total) by mouth every 8 (eight) hours.   senna 8.6 MG Tabs tablet Commonly known as: SENOKOT Take 1 tablet by mouth at bedtime.   SYRINGE-NEEDLE (DISP) 3 ML 22G X 1" 3 ML Misc Use as directed.   tiZANidine 4 MG tablet Commonly known as: ZANAFLEX Take 4 mg by mouth at bedtime.   valsartan 80 MG tablet Commonly known as: DIOVAN Take 80 mg by mouth daily.        Allergies:  Allergies  Allergen Reactions   Amoxicillin Anaphylaxis    REACTION: unspecified   Penicillin G  Anaphylaxis   Gabapentin Other (See Comments)    REACTION: anaphylaxis    Family History: Family History  Problem Relation Age of Onset   Hypertension Mother    Lung disease Mother    Heart attack Father    Diabetes Father    Liver cancer Sister    Lung cancer Sister    Colon cancer Sister    Uterine cancer Sister     Social History:  reports that he quit smoking about 18 years ago. His smoking use included cigarettes. He started smoking about 48 years ago. He has a 30 pack-year smoking history. He has never used smokeless tobacco. He reports current alcohol use. He reports that he does not use drugs.   Physical Exam: BP 120/73   Pulse 74   Ht 6' (1.829 m)   Wt 208 lb (94.3 kg)   BMI 28.21 kg/m   Constitutional:  Alert and oriented, No acute distress. HEENT: [place] AT Respiratory: Normal respiratory effort, no increased work of breathing. GU: Prostate 35 g, smooth without nodules Psychiatric: Normal mood and affect.   Assessment & Plan:    1.  Elevated PSA Although PSA is a prostate cancer screening test he was informed that cancer is not the most common cause of an elevated PSA. Other potential causes including BPH and inflammation were discussed.  He was informed that the only way to adequately diagnose prostate cancer would be transrectal ultrasound and biopsy of the prostate. The procedure was discussed  including potential risks of bleeding and infection/sepsis. He was also informed that a negative biopsy does not conclusively rule out the possibility that prostate cancer may be present and that continued monitoring is required.  The use of newer adjunctive blood and urine tests to predict the probability of high-grade prostate cancer were discussed were discussed.  The use of multiparametric prostate MRI to evaluate for lesions suspicious for high grade prostate cancer and aid in targeted bx was reviewed.  Continued periodic surveillance was also discussed. He has had worsening lower urinary tract symptoms over the past 6 months and his elevated PSA could be secondary to transient inflammation.  Will increase his alfuzosin to 10 mg twice daily and repeat his PSA in 1 month He has a spinal cord stimulator which may not be MRI compatible.  If PSA remains persistently elevated would recommend prostate biopsy if unable to have MRI  2.  BPH with LUTS As above  3.  Hypogonadism Long history of hypogonadism on TRT His TRT was discontinued We discussed that TRT will not cause prostate cancer and recent studies have also shown testosterone will not stimulate growth of an undetected prostate cancer He is having significant low T symptoms and okay to restart TRT   Riki Altes, MD  [facility] [address], [address] [place], [place] [address] [phone]

## 2023-03-29 ENCOUNTER — Other Ambulatory Visit: Payer: Self-pay | Admitting: Nurse Practitioner

## 2023-03-29 DIAGNOSIS — Z79899 Other long term (current) drug therapy: Secondary | ICD-10-CM

## 2023-03-29 NOTE — Telephone Encounter (Signed)
Please review not covered

## 2023-04-08 ENCOUNTER — Other Ambulatory Visit: Payer: Self-pay | Admitting: *Deleted

## 2023-04-08 DIAGNOSIS — D509 Iron deficiency anemia, unspecified: Secondary | ICD-10-CM

## 2023-04-11 ENCOUNTER — Inpatient Hospital Stay: Payer: No Typology Code available for payment source | Attending: Oncology

## 2023-04-11 DIAGNOSIS — Z880 Allergy status to penicillin: Secondary | ICD-10-CM | POA: Diagnosis not present

## 2023-04-11 DIAGNOSIS — Z825 Family history of asthma and other chronic lower respiratory diseases: Secondary | ICD-10-CM | POA: Diagnosis not present

## 2023-04-11 DIAGNOSIS — Z8249 Family history of ischemic heart disease and other diseases of the circulatory system: Secondary | ICD-10-CM | POA: Diagnosis not present

## 2023-04-11 DIAGNOSIS — Z87891 Personal history of nicotine dependence: Secondary | ICD-10-CM | POA: Insufficient documentation

## 2023-04-11 DIAGNOSIS — Z79899 Other long term (current) drug therapy: Secondary | ICD-10-CM | POA: Diagnosis not present

## 2023-04-11 DIAGNOSIS — I252 Old myocardial infarction: Secondary | ICD-10-CM | POA: Insufficient documentation

## 2023-04-11 DIAGNOSIS — Z833 Family history of diabetes mellitus: Secondary | ICD-10-CM | POA: Insufficient documentation

## 2023-04-11 DIAGNOSIS — Z8719 Personal history of other diseases of the digestive system: Secondary | ICD-10-CM | POA: Diagnosis not present

## 2023-04-11 DIAGNOSIS — Z888 Allergy status to other drugs, medicaments and biological substances status: Secondary | ICD-10-CM | POA: Diagnosis not present

## 2023-04-11 DIAGNOSIS — Z87442 Personal history of urinary calculi: Secondary | ICD-10-CM | POA: Insufficient documentation

## 2023-04-11 DIAGNOSIS — E785 Hyperlipidemia, unspecified: Secondary | ICD-10-CM | POA: Insufficient documentation

## 2023-04-11 DIAGNOSIS — Z9049 Acquired absence of other specified parts of digestive tract: Secondary | ICD-10-CM | POA: Insufficient documentation

## 2023-04-11 DIAGNOSIS — R42 Dizziness and giddiness: Secondary | ICD-10-CM | POA: Diagnosis not present

## 2023-04-11 DIAGNOSIS — D509 Iron deficiency anemia, unspecified: Secondary | ICD-10-CM | POA: Insufficient documentation

## 2023-04-11 DIAGNOSIS — Z800 Family history of malignant neoplasm of digestive organs: Secondary | ICD-10-CM | POA: Insufficient documentation

## 2023-04-11 DIAGNOSIS — E039 Hypothyroidism, unspecified: Secondary | ICD-10-CM | POA: Insufficient documentation

## 2023-04-11 DIAGNOSIS — R5383 Other fatigue: Secondary | ICD-10-CM | POA: Insufficient documentation

## 2023-04-11 DIAGNOSIS — K921 Melena: Secondary | ICD-10-CM | POA: Insufficient documentation

## 2023-04-11 DIAGNOSIS — Z8049 Family history of malignant neoplasm of other genital organs: Secondary | ICD-10-CM | POA: Diagnosis not present

## 2023-04-11 DIAGNOSIS — Z801 Family history of malignant neoplasm of trachea, bronchus and lung: Secondary | ICD-10-CM | POA: Diagnosis not present

## 2023-04-11 DIAGNOSIS — Z860100 Personal history of colon polyps, unspecified: Secondary | ICD-10-CM | POA: Insufficient documentation

## 2023-04-11 DIAGNOSIS — R531 Weakness: Secondary | ICD-10-CM | POA: Insufficient documentation

## 2023-04-11 DIAGNOSIS — I10 Essential (primary) hypertension: Secondary | ICD-10-CM | POA: Insufficient documentation

## 2023-04-11 LAB — CBC WITH DIFFERENTIAL/PLATELET
Abs Immature Granulocytes: 0.04 10*3/uL (ref 0.00–0.07)
Basophils Absolute: 0.1 10*3/uL (ref 0.0–0.1)
Basophils Relative: 1 %
Eosinophils Absolute: 0.1 10*3/uL (ref 0.0–0.5)
Eosinophils Relative: 1 %
HCT: 32.5 % — ABNORMAL LOW (ref 39.0–52.0)
Hemoglobin: 9.6 g/dL — ABNORMAL LOW (ref 13.0–17.0)
Immature Granulocytes: 0 %
Lymphocytes Relative: 13 %
Lymphs Abs: 1.2 10*3/uL (ref 0.7–4.0)
MCH: 24.9 pg — ABNORMAL LOW (ref 26.0–34.0)
MCHC: 29.5 g/dL — ABNORMAL LOW (ref 30.0–36.0)
MCV: 84.2 fL (ref 80.0–100.0)
Monocytes Absolute: 0.8 10*3/uL (ref 0.1–1.0)
Monocytes Relative: 8 %
Neutro Abs: 7.4 10*3/uL (ref 1.7–7.7)
Neutrophils Relative %: 77 %
Platelets: 275 10*3/uL (ref 150–400)
RBC: 3.86 MIL/uL — ABNORMAL LOW (ref 4.22–5.81)
RDW: 18.6 % — ABNORMAL HIGH (ref 11.5–15.5)
WBC: 9.6 10*3/uL (ref 4.0–10.5)
nRBC: 0 % (ref 0.0–0.2)

## 2023-04-11 LAB — IRON AND TIBC
Iron: 98 ug/dL (ref 45–182)
Saturation Ratios: 24 % (ref 17.9–39.5)
TIBC: 402 ug/dL (ref 250–450)
UIBC: 304 ug/dL

## 2023-04-11 LAB — SAMPLE TO BLOOD BANK

## 2023-04-11 LAB — FERRITIN: Ferritin: 18 ng/mL — ABNORMAL LOW (ref 24–336)

## 2023-04-12 ENCOUNTER — Telehealth: Payer: Self-pay | Admitting: Nurse Practitioner

## 2023-04-12 ENCOUNTER — Inpatient Hospital Stay: Payer: No Typology Code available for payment source

## 2023-04-12 ENCOUNTER — Inpatient Hospital Stay (HOSPITAL_BASED_OUTPATIENT_CLINIC_OR_DEPARTMENT_OTHER): Payer: No Typology Code available for payment source | Admitting: Oncology

## 2023-04-12 DIAGNOSIS — D509 Iron deficiency anemia, unspecified: Secondary | ICD-10-CM | POA: Diagnosis not present

## 2023-04-12 NOTE — Telephone Encounter (Signed)
 Lvm & sent mychart msg to change 04/14/23 appointment to virtual-[name]

## 2023-04-12 NOTE — Progress Notes (Signed)
 [facility]  [phone]) [phone] Fax:(336) [phone]  ID: CONSTANCE HACKENBERG Mclean OB: 1960-06-04  MR#: [id number]  AOZ#:[id number]  Patient Care Team: Sallyanne Kuster, NP as PCP - General (Nurse Practitioner) Lyndon Code, MD (Internal Medicine) Jeralyn Ruths, MD as Consulting Physician (Oncology)  I connected with Randy Mclean on 04/12/23 at  2:30 PM EST by video enabled telemedicine visit and verified that I am speaking with the correct person using two identifiers.   I discussed the limitations, risks, security and privacy concerns of performing an evaluation and management service by telemedicine and the availability of in-person appointments. I also discussed with the patient that there may be a patient responsible charge related to this service. The patient expressed understanding and agreed to proceed.   Other persons participating in the visit and their role in the encounter: Patient, MD.  Patient's location: Home. Provider's location: Clinic.  CHIEF COMPLAINT: Iron deficiency anemia.  INTERVAL HISTORY: Patient agreed to video-assisted telemedicine visit for further evaluation and discussion of his laboratory results.  His weakness and fatigue have improved, but still evident and he continues to have occasional dizziness.  His hemorrhoidectomy surgery was postponed until mid March.  He otherwise feels well.  He has no other neurologic complaints.  He denies any recent fevers or illnesses.  He has no chest pain, shortness of breath, cough, or hemoptysis.  He denies any nausea, vomiting, constipation, or diarrhea.  He has no melena but has noticed bright red blood per rectum.  He has no urinary complaints.  Patient offers no further specific complaints today.  REVIEW OF SYSTEMS:   Review of Systems  Constitutional:  Positive for malaise/fatigue. Negative for fever and weight loss.  Respiratory: Negative.  Negative for cough and shortness of  breath.   Cardiovascular: Negative.  Negative for chest pain and leg swelling.  Gastrointestinal:  Positive for blood in stool. Negative for abdominal pain and melena.  Genitourinary: Negative.  Negative for hematuria.  Musculoskeletal: Negative.  Negative for back pain and neck pain.  Skin: Negative.  Negative for rash.  Neurological:  Positive for dizziness and weakness. Negative for sensory change, focal weakness and headaches.  Psychiatric/Behavioral: Negative.  The patient is not nervous/anxious.     As per HPI. Otherwise, a complete review of systems is negative.  PAST MEDICAL HISTORY: Past Medical History:  Diagnosis Date   Acute appendicitis 12/04/2017   Allergy    takes allergy shots   Anemia    Arthritis    Asthma    Barrett esophagus    Bilateral carotid artery stenosis    BPH (benign prostatic hyperplasia)    Bronchitis    Chronic pain    CKD (chronic kidney disease) stage 3, GFR 30-59 ml/min (HCC)    Colon polyp    COPD (chronic obstructive pulmonary disease) (HCC)    Coronary artery disease    Depression    Diverticulosis    Former smoker    GERD (gastroesophageal reflux disease)    Hemorrhoid    History of kidney stones    Hyperlipidemia    Hypertension    Hypothyroid    IBS (irritable bowel syndrome)    Kidney stone    MI (myocardial infarction) (HCC)    between 2002 and 2006   Migraine    Narcolepsy    Pneumonia    Pre-diabetes    Seizures (HCC)    Sinus problem    Spinal cord stimulator status    Spinal  stenosis of lumbar region     PAST SURGICAL HISTORY: Past Surgical History:  Procedure Laterality Date   ANTERIOR CERVICAL DISCECTOMY     APPENDECTOMY     BACK SURGERY     BACK SURGERY  12/04/2020   COLONOSCOPY  09/2013   COLONOSCOPY N/A 05/09/2022   Procedure: COLONOSCOPY;  Surgeon: Jaynie Collins, DO;  Location: [facility] ENDOSCOPY;  Service: Gastroenterology;  Laterality: N/A;   elbow surgery     ESOPHAGOGASTRODUODENOSCOPY (EGD)  WITH PROPOFOL N/A 05/09/2022   Procedure: ESOPHAGOGASTRODUODENOSCOPY (EGD) WITH PROPOFOL;  Surgeon: Jaynie Collins, DO;  Location: [facility] ENDOSCOPY;  Service: Gastroenterology;  Laterality: N/A;   GIVENS CAPSULE STUDY N/A 05/09/2022   Procedure: GIVENS CAPSULE STUDY;  Surgeon: Jaynie Collins, DO;  Location: [facility] ENDOSCOPY;  Service: Gastroenterology;  Laterality: N/A;   HEMORRHOID SURGERY     LAPAROSCOPIC APPENDECTOMY N/A 12/04/2017   Procedure: APPENDECTOMY LAPAROSCOPIC;  Surgeon: Ancil Linsey, MD;  Location: [facility] ORS;  Service: General;  Laterality: N/A;   LIPOMA EXCISION Right    leg   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     SPINAL CORD STIMULATOR IMPLANT     UPPER GI ENDOSCOPY  09/2013   WRIST SURGERY Left 01/14/2021    FAMILY HISTORY Family History  Problem Relation Age of Onset   Hypertension Mother    Lung disease Mother    Heart attack Father    Diabetes Father    Liver cancer Sister    Lung cancer Sister    Colon cancer Sister    Uterine cancer Sister        ADVANCED DIRECTIVES:    HEALTH MAINTENANCE: Social History   Tobacco Use   Smoking status: Former    Current packs/day: 0.00    Average packs/day: 1 pack/day for 30.0 years (30.0 ttl pk-yrs)    Types: Cigarettes    Start date: 02/23/1975    Quit date: 02/22/2005    Years since quitting: 18.1   Smokeless tobacco: Never  Vaping Use   Vaping status: Never Used  Substance Use Topics   Alcohol use: Yes    Alcohol/week: 0.0 standard drinks of alcohol    Comment: occasionally   Drug use: No    Comment: past     Colonoscopy:  PAP:  Bone density:  Lipid panel:  Allergies  Allergen Reactions   Amoxicillin Anaphylaxis    REACTION: unspecified   Penicillin G Anaphylaxis   Gabapentin Other (See Comments)    REACTION: anaphylaxis    Current Outpatient Medications  Medication Sig Dispense Refill   alfuzosin (UROXATRAL) 10 MG 24 hr tablet Take 1 tablet (10 mg total) by mouth in the  morning and at bedtime. 60 tablet 0   ALPRAZolam (XANAX) 0.5 MG tablet Take 1 tablet (0.5 mg total) by mouth 3 (three) times daily as needed for anxiety. 90 tablet 2   amLODipine (NORVASC) 10 MG tablet Take 1 tablet (10 mg total) by mouth daily. 90 tablet 1   atorvastatin (LIPITOR) 10 MG tablet Take 1 tablet (10 mg total) by mouth daily. 90 tablet 1   benzonatate (TESSALON) 200 MG capsule Take 1 capsule (200 mg total) by mouth 2 (two) times daily as needed for cough. (Patient taking differently: Take 200 mg by mouth daily.) 30 capsule 3   Budeson-Glycopyrrol-Formoterol (BREZTRI AEROSPHERE) 160-9-4.8 MCG/ACT AERO Inhale 2 puffs into the lungs in the morning and at bedtime. 10.7 g 5   buPROPion (WELLBUTRIN XL) 300 MG 24 hr tablet TAKE 1  TABLET BY MOUTH DAILY, GENERIC EQUIVALENT FOR WELLBUTRIN XL. DUE FOR OFFICE VISIT. 90 tablet 1   esomeprazole (NEXIUM) 40 MG capsule Take 1 capsule (40 mg total) by mouth 2 (two) times daily before a meal. 180 capsule 1   fexofenadine (ALLEGRA) 180 MG tablet Take 180 mg by mouth daily.     FIBER GUMMIES PO Take 2 tablets by mouth daily at 2 am.     hydrochlorothiazide (HYDRODIURIL) 12.5 MG tablet Take 1 tablet (12.5 mg total) by mouth daily. 90 tablet 1   ipratropium-albuterol (DUONEB) 0.5-2.5 (3) MG/3ML SOLN Take 3 mLs by nebulization every 6 (six) hours as needed (SOB/wheezing/cough). 360 mL 3   levothyroxine (SYNTHROID) 150 MCG tablet TAKE 1 TABLET BY MOUTH EVERY DAY BEFORE BREAKFAST 90 tablet 1   methocarbamol (ROBAXIN) 750 MG tablet Take 750 mg by mouth 4 (four) times daily as needed for muscle spasms.     metoprolol succinate (TOPROL-XL) 25 MG 24 hr tablet Take 25 mg by mouth daily.     modafinil (PROVIGIL) 200 MG tablet Take 1 tablet (200 mg total) by mouth daily. 30 tablet 2   Multiple Vitamin (MULTI-VITAMINS) TABS Take 1 tablet by mouth daily.     pregabalin (LYRICA) 100 MG capsule Take 1 capsule (100 mg total) by mouth every 8 (eight) hours. 10 capsule 0    senna (SENOKOT) 8.6 MG TABS tablet Take 1 tablet by mouth at bedtime.     SYRINGE-NEEDLE, DISP, 3 ML 22G X 1" 3 ML MISC Use as directed.     testosterone cypionate (DEPOTESTOSTERONE CYPIONATE) 200 MG/ML injection Inject 0.7 mLs (140 mg total) into the muscle once a week. 4 mL 0   tiZANidine (ZANAFLEX) 4 MG tablet Take 4 mg by mouth at bedtime.     valsartan (DIOVAN) 80 MG tablet Take 80 mg by mouth daily.     No current facility-administered medications for this visit.    OBJECTIVE: There were no vitals filed for this visit.      There is no height or weight on file to calculate BMI.    ECOG FS:1 - Symptomatic but completely ambulatory  General: Well-developed, well-nourished, no acute distress. HEENT: Normocephalic. Neuro: Alert, answering all questions appropriately. Cranial nerves grossly intact. Psych: Normal affect.  LAB RESULTS:  Lab Results  Component Value Date   NA 137 10/18/2022   K 3.5 10/18/2022   CL 102 10/18/2022   CO2 28 10/18/2022   GLUCOSE 97 10/18/2022   BUN 9 10/18/2022   CREATININE 1.14 10/18/2022   CALCIUM 8.8 (L) 10/18/2022   PROT 7.0 10/18/2022   ALBUMIN 4.1 10/18/2022   AST 18 10/18/2022   ALT 20 10/18/2022   ALKPHOS 64 10/18/2022   BILITOT 0.4 10/18/2022   GFRNONAA >60 10/18/2022   GFRAA >60 05/05/2019    Lab Results  Component Value Date   WBC 9.6 04/11/2023   NEUTROABS 7.4 04/11/2023   HGB 9.6 (L) 04/11/2023   HCT 32.5 (L) 04/11/2023   MCV 84.2 04/11/2023   PLT 275 04/11/2023   Lab Results  Component Value Date   IRON 98 04/11/2023   TIBC 402 04/11/2023   IRONPCTSAT 24 04/11/2023   Lab Results  Component Value Date   FERRITIN 18 (L) 04/11/2023     STUDIES: No results found.  ASSESSMENT: Iron deficiency anemia.  PLAN:    Iron deficiency anemia: EGD and colonoscopy on May 09, 2022 did not reveal any significant pathology.  Patient also had capsule endoscopy, but these results  are not available at this time.  Patient's  hemorrhoidectomy surgery was postponed until mid March secondary to patient having the flu.  Although his iron stores have improved, his hemoglobin has trended down and he continues to be symptomatic.  Return to clinic this Friday for continuation of IV Venofer.  Patient will then return to clinic 3 times over the next several weeks for a total of 4 treatments.  He would then return to clinic in 3 months for repeat laboratory, further evaluation, and continuation of treatment if needed.   GI bleed: Hemorrhoidectomy as above. Family history of colon cancer:  Patient gets routine colonoscopies given his family history of colon cancer, all of which been unrevealing.  Unclear when his last colonoscopy was completed.  His genetic testing is negative. Dizziness/weakness/fatigue: Likely multifactorial secondary to discontinuing testosterone supplementation as well as iron deficiency anemia.  Elevated PSA: Patient's most recent PSA was 6.5.  Continue monitoring and evaluation per urology.  I provided 30 minutes of face-to-face video visit time during this encounter which included chart review, counseling, and coordination of care as documented above.    Patient expressed understanding and was in agreement with this plan. He also understands that He can call clinic at any time with any questions, concerns, or complaints.    Jeralyn Ruths, MD   04/12/2023 2:15 PM

## 2023-04-14 ENCOUNTER — Encounter: Payer: Self-pay | Admitting: Nurse Practitioner

## 2023-04-14 ENCOUNTER — Telehealth (INDEPENDENT_AMBULATORY_CARE_PROVIDER_SITE_OTHER): Payer: No Typology Code available for payment source | Admitting: Nurse Practitioner

## 2023-04-14 VITALS — Ht 72.0 in | Wt 208.0 lb

## 2023-04-14 DIAGNOSIS — J432 Centrilobular emphysema: Secondary | ICD-10-CM | POA: Diagnosis not present

## 2023-04-14 DIAGNOSIS — K641 Second degree hemorrhoids: Secondary | ICD-10-CM

## 2023-04-14 DIAGNOSIS — Z79899 Other long term (current) drug therapy: Secondary | ICD-10-CM

## 2023-04-14 DIAGNOSIS — E039 Hypothyroidism, unspecified: Secondary | ICD-10-CM | POA: Diagnosis not present

## 2023-04-14 DIAGNOSIS — R972 Elevated prostate specific antigen [PSA]: Secondary | ICD-10-CM

## 2023-04-14 DIAGNOSIS — R053 Chronic cough: Secondary | ICD-10-CM

## 2023-04-14 MED ORDER — BENZONATATE 200 MG PO CAPS: 200.0000 mg | ORAL_CAPSULE | Freq: Every day | ORAL | 3 refills | Status: DC

## 2023-04-14 MED ORDER — ESOMEPRAZOLE MAGNESIUM 40 MG PO CPDR: 40.0000 mg | DELAYED_RELEASE_CAPSULE | Freq: Two times a day (BID) | ORAL | 1 refills | Status: DC

## 2023-04-14 MED ORDER — BUPROPION HCL ER (XL) 300 MG PO TB24: ORAL_TABLET | ORAL | 1 refills | Status: DC

## 2023-04-14 MED ORDER — LEVOTHYROXINE SODIUM 150 MCG PO TABS: 150.0000 ug | ORAL_TABLET | Freq: Every day | ORAL | 1 refills | Status: DC

## 2023-04-14 MED ORDER — ALPRAZOLAM 0.5 MG PO TABS
0.5000 mg | ORAL_TABLET | Freq: Three times a day (TID) | ORAL | 2 refills | Status: DC | PRN
Start: 2023-04-14 — End: 2023-07-06

## 2023-04-14 MED ORDER — MODAFINIL 200 MG PO TABS: 200.0000 mg | ORAL_TABLET | Freq: Every day | ORAL | 2 refills | Status: DC

## 2023-04-14 NOTE — Progress Notes (Signed)
 [facility] [address] [place], [place] [address]  Internal MEDICINE  Telephone Visit  Patient Name: Randy Mclean  [id number]  [id number]  Date of Service: 04/14/2023  I connected with the patient at 1250 by telephone and verified the patients identity using two identifiers.   I discussed the limitations, risks, security and privacy concerns of performing an evaluation and management service by telephone and the availability of in person appointments. I also discussed with the patient that there may be a patient responsible charge related to the service.  The patient expressed understanding and agrees to proceed.    Chief Complaint  Patient presents with   Telephone Screen   Telephone Assessment   Depression    HPI Randy Mclean presents for a telehealth virtual visit for COPD, elevated PSA, BPH, hemorrhoids, narcolepsy, excessive daytime sleepiness.  COPD -- on breztri inhaler which is helping a lot . Elevated PSA -- seen by urology who is monitoring him and they have also taken over his TRT and monitoring this as well.  Hemorrhoids with rectal bleeding -- he is being monitored by hematology/oncology and GI.  Takes modafinil for excessive daytime sleepiness, due for refills Anxiety -- takes alprazolam as needed, due for refills.   Current Medication: Outpatient Encounter Medications as of 04/14/2023  Medication Sig   alfuzosin (UROXATRAL) 10 MG 24 hr tablet Take 1 tablet (10 mg total) by mouth in the morning and at bedtime.   amLODipine (NORVASC) 10 MG tablet Take 1 tablet (10 mg total) by mouth daily.   atorvastatin (LIPITOR) 10 MG tablet Take 1 tablet (10 mg total) by mouth daily.   Budeson-Glycopyrrol-Formoterol (BREZTRI AEROSPHERE) 160-9-4.8 MCG/ACT AERO Inhale 2 puffs into the lungs in the morning and at bedtime.   fexofenadine (ALLEGRA) 180 MG tablet Take 180 mg by mouth daily.   FIBER GUMMIES PO Take 2 tablets by mouth daily at 2 am.   hydrochlorothiazide  (HYDRODIURIL) 12.5 MG tablet Take 1 tablet (12.5 mg total) by mouth daily.   ipratropium-albuterol (DUONEB) 0.5-2.5 (3) MG/3ML SOLN Take 3 mLs by nebulization every 6 (six) hours as needed (SOB/wheezing/cough).   methocarbamol (ROBAXIN) 750 MG tablet Take 750 mg by mouth 4 (four) times daily as needed for muscle spasms.   metoprolol succinate (TOPROL-XL) 25 MG 24 hr tablet Take 25 mg by mouth daily.   Multiple Vitamin (MULTI-VITAMINS) TABS Take 1 tablet by mouth daily.   pregabalin (LYRICA) 100 MG capsule Take 1 capsule (100 mg total) by mouth every 8 (eight) hours.   senna (SENOKOT) 8.6 MG TABS tablet Take 1 tablet by mouth at bedtime.   SYRINGE-NEEDLE, DISP, 3 ML 22G X 1" 3 ML MISC Use as directed.   testosterone cypionate (DEPOTESTOSTERONE CYPIONATE) 200 MG/ML injection Inject 0.7 mLs (140 mg total) into the muscle once a week.   tiZANidine (ZANAFLEX) 4 MG tablet Take 4 mg by mouth at bedtime.   valsartan (DIOVAN) 80 MG tablet Take 80 mg by mouth daily.   [DISCONTINUED] ALPRAZolam (XANAX) 0.5 MG tablet Take 1 tablet (0.5 mg total) by mouth 3 (three) times daily as needed for anxiety.   [DISCONTINUED] benzonatate (TESSALON) 200 MG capsule Take 1 capsule (200 mg total) by mouth 2 (two) times daily as needed for cough. (Patient taking differently: Take 200 mg by mouth daily.)   [DISCONTINUED] buPROPion (WELLBUTRIN XL) 300 MG 24 hr tablet TAKE 1 TABLET BY MOUTH DAILY, GENERIC EQUIVALENT FOR WELLBUTRIN XL. DUE FOR OFFICE VISIT.   [DISCONTINUED] esomeprazole (NEXIUM) 40  MG capsule Take 1 capsule (40 mg total) by mouth 2 (two) times daily before a meal.   [DISCONTINUED] levothyroxine (SYNTHROID) 150 MCG tablet TAKE 1 TABLET BY MOUTH EVERY DAY BEFORE BREAKFAST   [DISCONTINUED] modafinil (PROVIGIL) 200 MG tablet Take 1 tablet (200 mg total) by mouth daily.   ALPRAZolam (XANAX) 0.5 MG tablet Take 1 tablet (0.5 mg total) by mouth 3 (three) times daily as needed for anxiety.   benzonatate (TESSALON) 200 MG  capsule Take 1 capsule (200 mg total) by mouth daily.   buPROPion (WELLBUTRIN XL) 300 MG 24 hr tablet TAKE 1 TABLET BY MOUTH DAILY, GENERIC EQUIVALENT FOR WELLBUTRIN XL. DUE FOR OFFICE VISIT.   esomeprazole (NEXIUM) 40 MG capsule Take 1 capsule (40 mg total) by mouth 2 (two) times daily before a meal.   levothyroxine (SYNTHROID) 150 MCG tablet Take 1 tablet (150 mcg total) by mouth daily before breakfast.   modafinil (PROVIGIL) 200 MG tablet Take 1 tablet (200 mg total) by mouth daily.   No facility-administered encounter medications on file as of 04/14/2023.    Surgical History: Past Surgical History:  Procedure Laterality Date   ANTERIOR CERVICAL DISCECTOMY     APPENDECTOMY     BACK SURGERY     BACK SURGERY  12/04/2020   COLONOSCOPY  09/2013   COLONOSCOPY N/A 05/09/2022   Procedure: COLONOSCOPY;  Surgeon: Jaynie Collins, DO;  Location: [facility] ENDOSCOPY;  Service: Gastroenterology;  Laterality: N/A;   elbow surgery     ESOPHAGOGASTRODUODENOSCOPY (EGD) WITH PROPOFOL N/A 05/09/2022   Procedure: ESOPHAGOGASTRODUODENOSCOPY (EGD) WITH PROPOFOL;  Surgeon: Jaynie Collins, DO;  Location: [facility] ENDOSCOPY;  Service: Gastroenterology;  Laterality: N/A;   GIVENS CAPSULE STUDY N/A 05/09/2022   Procedure: GIVENS CAPSULE STUDY;  Surgeon: Jaynie Collins, DO;  Location: [facility] ENDOSCOPY;  Service: Gastroenterology;  Laterality: N/A;   HEMORRHOID SURGERY     LAPAROSCOPIC APPENDECTOMY N/A 12/04/2017   Procedure: APPENDECTOMY LAPAROSCOPIC;  Surgeon: Ancil Linsey, MD;  Location: [facility] ORS;  Service: General;  Laterality: N/A;   LIPOMA EXCISION Right    leg   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     SPINAL CORD STIMULATOR IMPLANT     UPPER GI ENDOSCOPY  09/2013   WRIST SURGERY Left 01/14/2021    Medical History: Past Medical History:  Diagnosis Date   Acute appendicitis 12/04/2017   Allergy    takes allergy shots   Anemia    Arthritis    Asthma    Barrett esophagus     Bilateral carotid artery stenosis    BPH (benign prostatic hyperplasia)    Bronchitis    Chronic pain    CKD (chronic kidney disease) stage 3, GFR 30-59 ml/min (HCC)    Colon polyp    COPD (chronic obstructive pulmonary disease) (HCC)    Coronary artery disease    Depression    Diverticulosis    Former smoker    GERD (gastroesophageal reflux disease)    Hemorrhoid    History of kidney stones    Hyperlipidemia    Hypertension    Hypothyroid    IBS (irritable bowel syndrome)    Kidney stone    MI (myocardial infarction) (HCC)    between 2002 and 2006   Migraine    Narcolepsy    Pneumonia    Pre-diabetes    Seizures (HCC)    Sinus problem    Spinal cord stimulator status    Spinal stenosis of lumbar region     Family  History: Family History  Problem Relation Age of Onset   Hypertension Mother    Lung disease Mother    Heart attack Father    Diabetes Father    Liver cancer Sister    Lung cancer Sister    Colon cancer Sister    Uterine cancer Sister     Social History   Socioeconomic History   Marital status: Married    Spouse name: Not on file   Number of children: Not on file   Years of education: Not on file   Highest education level: Not on file  Occupational History   Not on file  Tobacco Use   Smoking status: Former    Current packs/day: 0.00    Average packs/day: 1 pack/day for 30.0 years (30.0 ttl pk-yrs)    Types: Cigarettes    Start date: 02/23/1975    Quit date: 02/22/2005    Years since quitting: 18.1   Smokeless tobacco: Never  Vaping Use   Vaping status: Never Used  Substance and Sexual Activity   Alcohol use: Yes    Alcohol/week: 0.0 standard drinks of alcohol    Comment: occasionally   Drug use: No    Comment: past   Sexual activity: Yes  Other Topics Concern   Not on file  Social History Narrative   Not on file   Social Drivers of Health   Financial Resource Strain: Low Risk  (02/08/2023)   Received from [facility]   Overall Financial Resource Strain (CARDIA)    Difficulty of Paying Living Expenses: Not very hard  Food Insecurity: Food Insecurity Present (02/08/2023)   Received from [facility]   Hunger Vital Sign    Worried About Running Out of Food in the Last Year: Sometimes true    Ran Out of Food in the Last Year: Never true  Transportation Needs: No Transportation Needs (02/08/2023)   Received from [facility]    In the past 12 months, has lack of transportation kept you from medical appointments or from getting medications?: No    Lack of Transportation (Non-Medical): No  Physical Activity: Not on file  Stress: Not on file  Social Connections: Not on file  Intimate Partner Violence: Not At Risk (05/07/2022)   Humiliation, Afraid, Rape, and Kick questionnaire    Fear of Current or Ex-Partner: No    Emotionally Abused: No    Physically Abused: No    Sexually Abused: No      Review of Systems  Constitutional:  Positive for fatigue.  Respiratory:  Positive for cough (improving). Negative for chest tightness, shortness of breath and wheezing.   Cardiovascular: Negative.  Negative for chest pain and palpitations.  Gastrointestinal:  Positive for anal bleeding, blood in stool and rectal pain.    Vital Signs: Ht 6' (1.829 m)   Wt 208 lb (94.3 kg)   BMI 28.21 kg/m    Observation/Objective: He is alert and oriented. No acute distress noted.     Assessment/Plan: 1. Centrilobular emphysema (HCC) (Primary) Continue breztri inhaler as prescribed.   2. Acquired hypothyroidism Continue levothyroxine as prescribed.  - levothyroxine (SYNTHROID) 150 MCG tablet; Take 1 tablet (150 mcg total) by mouth daily before breakfast.  Dispense: 90 tablet; Refill: 1  3. Grade II internal hemorrhoids Seeing GI for this. Is planning on getting hemorrhoidectomy soon.   4. Elevated PSA Seeing urology, uroxatrol dose was increased  to twice daily and then  they will repeat the PSA level. If still elevated, patient reports that they plan to have an MRI done to determine if a biopsy is needed.   5. Chronic cough Continue benzonatate as needed as prescribed.  - benzonatate (TESSALON) 200 MG capsule; Take 1 capsule (200 mg total) by mouth daily.  Dispense: 90 capsule; Refill: 3  6. Encounter for medication review Medication list reviewed, updated and medication refills ordered  - ALPRAZolam (XANAX) 0.5 MG tablet; Take 1 tablet (0.5 mg total) by mouth 3 (three) times daily as needed for anxiety.  Dispense: 90 tablet; Refill: 2 - modafinil (PROVIGIL) 200 MG tablet; Take 1 tablet (200 mg total) by mouth daily.  Dispense: 30 tablet; Refill: 2 - esomeprazole (NEXIUM) 40 MG capsule; Take 1 capsule (40 mg total) by mouth 2 (two) times daily before a meal.  Dispense: 180 capsule; Refill: 1 - buPROPion (WELLBUTRIN XL) 300 MG 24 hr tablet; TAKE 1 TABLET BY MOUTH DAILY, GENERIC EQUIVALENT FOR WELLBUTRIN XL. DUE FOR OFFICE VISIT.  Dispense: 90 tablet; Refill: 1   General Counseling: Taray verbalizes understanding of the findings of today's phone visit and agrees with plan of treatment. I have discussed any further diagnostic evaluation that may be needed or ordered today. We also reviewed his medications today. he has been encouraged to call the office with any questions or concerns that should arise related to todays visit.  Return in about 3 months (around 07/06/2023) for F/U, anxiety med refill, Brailee Riede PCP alprazolam and modafinil. .   No orders of the defined types were placed in this encounter.   Meds ordered this encounter  Medications   ALPRAZolam (XANAX) 0.5 MG tablet    Sig: Take 1 tablet (0.5 mg total) by mouth 3 (three) times daily as needed for anxiety.    Dispense:  90 tablet    Refill:  2   modafinil (PROVIGIL) 200 MG tablet    Sig: Take 1 tablet (200 mg total) by mouth daily.    Dispense:  30 tablet    Refill:  2     refills   esomeprazole (NEXIUM) 40 MG capsule    Sig: Take 1 capsule (40 mg total) by mouth 2 (two) times daily before a meal.    Dispense:  180 capsule    Refill:  1   benzonatate (TESSALON) 200 MG capsule    Sig: Take 1 capsule (200 mg total) by mouth daily.    Dispense:  90 capsule    Refill:  3    Fill new script today   buPROPion (WELLBUTRIN XL) 300 MG 24 hr tablet    Sig: TAKE 1 TABLET BY MOUTH DAILY, GENERIC EQUIVALENT FOR WELLBUTRIN XL. DUE FOR OFFICE VISIT.    Dispense:  90 tablet    Refill:  1   levothyroxine (SYNTHROID) 150 MCG tablet    Sig: Take 1 tablet (150 mcg total) by mouth daily before breakfast.    Dispense:  90 tablet    Refill:  1    For future refills, 90 day supply    Time spent:20 Minutes Time spent with patient included reviewing progress notes, labs, imaging studies, and discussing plan for follow up.  [place] Controlled Substance Database was reviewed by me for overdose risk score (ORS) if appropriate.  This patient was seen by Sallyanne Kuster, FNP-C in collaboration with Dr. Beverely Risen as a part of collaborative care agreement.  Lakara Weiland R. Tedd Sias, MSN, FNP-C Internal medicine

## 2023-04-15 ENCOUNTER — Inpatient Hospital Stay: Payer: No Typology Code available for payment source

## 2023-04-15 VITALS — BP 158/80 | HR 83 | Resp 16

## 2023-04-15 DIAGNOSIS — D508 Other iron deficiency anemias: Secondary | ICD-10-CM

## 2023-04-15 DIAGNOSIS — D509 Iron deficiency anemia, unspecified: Secondary | ICD-10-CM | POA: Diagnosis not present

## 2023-04-15 MED ORDER — IRON SUCROSE 20 MG/ML IV SOLN
200.0000 mg | Freq: Once | INTRAVENOUS | Status: AC
  Administered 2023-04-15: 200 mg via INTRAVENOUS
  Filled 2023-04-15: qty 10

## 2023-04-17 ENCOUNTER — Other Ambulatory Visit: Payer: Self-pay | Admitting: Urology

## 2023-04-18 ENCOUNTER — Other Ambulatory Visit: Payer: Self-pay

## 2023-04-18 DIAGNOSIS — R972 Elevated prostate specific antigen [PSA]: Secondary | ICD-10-CM

## 2023-04-20 ENCOUNTER — Inpatient Hospital Stay: Payer: No Typology Code available for payment source

## 2023-04-20 VITALS — BP 151/70 | HR 75 | Temp 97.2°F | Resp 18

## 2023-04-20 DIAGNOSIS — D508 Other iron deficiency anemias: Secondary | ICD-10-CM

## 2023-04-20 DIAGNOSIS — D509 Iron deficiency anemia, unspecified: Secondary | ICD-10-CM | POA: Diagnosis not present

## 2023-04-20 MED ORDER — IRON SUCROSE 20 MG/ML IV SOLN
200.0000 mg | Freq: Once | INTRAVENOUS | Status: AC
  Administered 2023-04-20: 200 mg via INTRAVENOUS

## 2023-04-22 ENCOUNTER — Other Ambulatory Visit: Payer: Self-pay

## 2023-04-22 ENCOUNTER — Other Ambulatory Visit: Payer: No Typology Code available for payment source

## 2023-04-22 ENCOUNTER — Inpatient Hospital Stay: Payer: No Typology Code available for payment source

## 2023-04-22 VITALS — BP 134/59 | HR 85 | Temp 96.5°F | Resp 18

## 2023-04-22 DIAGNOSIS — D508 Other iron deficiency anemias: Secondary | ICD-10-CM

## 2023-04-22 DIAGNOSIS — D509 Iron deficiency anemia, unspecified: Secondary | ICD-10-CM | POA: Diagnosis not present

## 2023-04-22 DIAGNOSIS — R972 Elevated prostate specific antigen [PSA]: Secondary | ICD-10-CM

## 2023-04-22 MED ORDER — IRON SUCROSE 20 MG/ML IV SOLN
200.0000 mg | Freq: Once | INTRAVENOUS | Status: AC
  Administered 2023-04-22: 200 mg via INTRAVENOUS

## 2023-04-22 NOTE — Patient Instructions (Signed)

## 2023-04-23 LAB — PSA: Prostate Specific Ag, Serum: 1.7 ng/mL (ref 0.0–4.0)

## 2023-04-26 ENCOUNTER — Telehealth: Payer: Self-pay

## 2023-04-26 NOTE — Progress Notes (Signed)
 Left message on voicemail.

## 2023-04-26 NOTE — Telephone Encounter (Signed)
 ERROR

## 2023-04-26 NOTE — Telephone Encounter (Signed)
 Pt calls triage line and states that he would like for Dr. Lonna Cobb to assume care of his testosterone. Advised pt I would put request into provider and it would be at the providers discretion. [facility] [address] Prefers 10ml Vials, injections 0.29ml weekly.

## 2023-04-27 ENCOUNTER — Inpatient Hospital Stay: Payer: No Typology Code available for payment source | Attending: Oncology

## 2023-04-27 VITALS — BP 135/66 | HR 82 | Temp 96.8°F | Resp 16

## 2023-04-27 DIAGNOSIS — Z79899 Other long term (current) drug therapy: Secondary | ICD-10-CM | POA: Insufficient documentation

## 2023-04-27 DIAGNOSIS — D509 Iron deficiency anemia, unspecified: Secondary | ICD-10-CM | POA: Diagnosis present

## 2023-04-27 DIAGNOSIS — D508 Other iron deficiency anemias: Secondary | ICD-10-CM

## 2023-04-27 MED ORDER — IRON SUCROSE 20 MG/ML IV SOLN
200.0000 mg | Freq: Once | INTRAVENOUS | Status: AC
  Administered 2023-04-27: 200 mg via INTRAVENOUS
  Filled 2023-04-27: qty 10

## 2023-04-27 MED ORDER — TESTOSTERONE CYPIONATE 200 MG/ML IM SOLN: 140.0000 mg | INTRAMUSCULAR | 0 refills | Status: DC

## 2023-04-27 NOTE — Patient Instructions (Signed)

## 2023-04-27 NOTE — Telephone Encounter (Signed)
 Rx sent.  Will need a lab visit 6 months for testosterone, H/H and 1 year office visit with testosterone, PSA, H/H

## 2023-04-28 NOTE — Telephone Encounter (Signed)
 Notified patient as instructed, patient pleased. Discussed follow-up appointments, patient agrees

## 2023-04-29 ENCOUNTER — Ambulatory Visit: Payer: Self-pay | Admitting: Surgery

## 2023-04-29 NOTE — H&P (View-Only) (Signed)
 Subjective:  CC: Grade II hemorrhoids [K64.1]   HPI:  Randy Mclean is a 63 y.o. male who was referrred by [facility] for above. Symptoms were first noted several years ago. he has some rectal bleeding occurring several times per week. Bleeding is described as heavy. Mostly painless.  Currently receiving IV infusion for anemia.  GI workup including capsule endoscopy negative for anything significant.   Past Medical History:  has a past medical history of Acute myocardial infarction of anterolateral wall, initial episode of care (CMS/HHS-HCC), Arthritis, Asthma without status asthmaticus (HHS-HCC) (not sure), Barrett's esophagus, BPH with obstruction/lower urinary tract symptoms (06/29/2012), Chronic low back pain, COPD (chronic obstructive pulmonary disease) (CMS/HHS-HCC), Coronary artery disease, Depression, Diverticulosis, Dyspnea (09/07/2011), GERD (gastroesophageal reflux disease), Heart disease- Mild Heart Attack, History of blood transfusion, History of colonic polyps, History of MI (myocardial infarction) (11/21/2020), History of nephrolithiasis, Hormone deficiency (2000), Hyperlipidemia, mixed (05/18/2019), Hypertension, Hypothyroidism, IDA (iron deficiency anemia) (11/21/2020), Internal hemorrhoids, Iron deficiency anemia, Male hypogonadism, Narcolepsy (HHS-HCC), Osteoarthritis of carpometacarpal (CMC) joint of left thumb, Pneumonia, Prediabetes (11/21/2020), Seasonal allergies, Seizures (CMS/HHS-HCC), Spinal cord stimulator in situ (11/20/2020), Spinal stenosis of lumbar region without neurogenic claudication (11/20/2020), Stage 3 chronic kidney disease (CMS/HHS-HCC) (11/21/2020), and Ulcer.  Past Surgical History:  has a past surgical history that includes Neuroplasty Ulnar Nerve At Elbow (Left, 03/11/2011); Paranasal Sinusotomy (1999); lumbar surgery; Spinal cord stimulator placement (02/01/2008); Colonoscopy (02/10/2000); Colonoscopy (02/31/2004); Colonoscopy (12/23/2006); Colonoscopy (10/10/2009);  egd (04/06/2002); egd (05/06/2004); egd (12/23/2006); egd (10/10/2009); Colonoscopy (09/24/2013); egd (09/24/2013); Fracture surgery (1970); Back surgery (2004 & 2010); Appendectomy (11/2017); Eye surgery (Bilateral); Colon surgery (2016); Hemorrhoidectomy Internal & External; arthrodesis anterior cervicle spine (N/A, 09/19/2018); arthrodesis anterior cervicle spine (N/A, 09/19/2018); instrumentation anterior spine 4 to 7 segments (N/A, 09/19/2018); insertion structural bone allograft for spine surgery (N/A, 09/19/2018); egd (03/21/2020); Colonoscopy (03/21/2020); laminectomy posterior lumbar facetectomy & foraminotomy w/decomp (N/A, 12/04/2020); incision for implantation/replacement spinal neurostimulator (N/A, 12/04/2020); intraoperative flouroscopy (N/A, 12/04/2020); revision wrist arthroplasty (Left, 01/14/2021); graft tendon (Left, 01/14/2021); carpectomy (Left, 01/14/2021); intraoperative flouroscopy (Left, 01/14/2021); Colon @ [facility] (05/09/2022); and EGD @ [facility] (05/09/2022).  Family History: family history includes Alcohol abuse in his mother; Anemia in his maternal grandmother and paternal grandmother; Asthma in his father, maternal grandmother, mother, and paternal uncle; Breast cancer in his paternal aunt; COPD in his mother; Colon cancer in his sister; Coronary Artery Disease (Blocked arteries around heart) in his father, paternal grandfather, and paternal grandmother; Depression in his mother; Diabetes type II in his father, maternal grandmother, paternal grandmother, sister, and sister; Heart disease in his father, paternal grandfather, and paternal grandmother; High blood pressure (Hypertension) in his maternal grandfather, maternal grandmother, mother, and paternal aunt; Hyperlipidemia (Elevated cholesterol) in his father and mother; Kidney disease in his mother; Lung cancer in his mother; Myocardial Infarction (Heart attack) in his father; Osteoarthritis in his paternal aunt, paternal aunt, and  paternal grandmother; Reflux disease in his father, mother, and paternal aunt; Rheum arthritis in his paternal aunt, paternal aunt, and paternal grandmother; Stroke in his paternal uncle.  Social History:  reports that he quit smoking about 18 years ago. His smoking use included cigarettes. He started smoking about 51 years ago. He has a 34 pack-year smoking history. He has never used smokeless tobacco. He reports current alcohol use of about 1.0 standard drink of alcohol per week. He reports that he does not use drugs.  Current Medications: has a current medication list which includes the following prescription(s): alfuzosin, alprazolam, amlodipine, atorvastatin, benzonatate, breztri aerosphere,  bupropion, esomeprazole, fexofenadine, glucosamine/chondroitin/c/mang, hydrochlorothiazide, hydrocodone-acetaminophen, hydrocodone-chlorpheniramine, ipratropium-albuterol, levofloxacin, levothyroxine, lyrica, methocarbamol, metoprolol succinate, modafinil, multivitamin, sennosides, tizanidine, valsartan, dextroamphetamine-amphetamine, and oxycodone-acetaminophen.  Allergies:  Allergies as of 02/08/2023 - Reviewed 02/08/2023  Allergen Reaction Noted   Amoxicillin Rash 08/31/2011   Neurontin [gabapentin] Unknown 08/31/2011   Penicillins Rash 08/31/2011   Adhesive Rash 11/11/2014    ROS:  A 15 point review of systems was performed and pertinent positives and negatives noted in HPI  Objective:     BP 120/73   Pulse 81   Ht 182.9 cm (6' 0.01")   Wt 97.5 kg (214 lb 15.2 oz)   BMI 29.15 kg/m   Constitutional :  No distress, cooperative, alert  Lymphatics/Throat:  Supple with no lymphadenopathy  Respiratory:  Clear to auscultation bilaterally  Cardiovascular:  Regular rate and rhythm  Gastrointestinal: Soft, non-tender, non-distended, no organomegaly.  Musculoskeletal: Steady gait and movement  Skin: Cool and moist  Psychiatric: Normal affect, non-agitated, not confused  Rectal:  External exam  normal. DRE revealed, decreased rectal tone, with palpable internal hemorrhoid noted at LL and RP portion with minimal discomfort.  After obtaining verbal consent, an anoscope was inserted after prepped with lubricant and internal hemorrhoids noted on DRE confirmed visually, with no evidence of thrombosis. No other pathology such as fissures, fistulas, polyps noted.  Moderate bleeding from hemorrhoids noted at time of exam. Scope withdrawn and patient tolerate procedure well.       LABS:  N/a   RADS: N/a Assessment:     Grade II hemorrhoids [K64.1] - moderate bleeding after exam despite relatively benign appearing mucosa over hemorrhoids.  Recommend hemorrhoidectomy rather than banding to minimize post procedure bleeding issues.  Plan:    1. Grade II hemorrhoids [K64.1] Discussed risks/benefits/alternatives to surgery.  Alternatives include the options of observation, medical management.  Benefits include symptomatic relief.  I discussed  in detail and the complications related to the operation and the anesthesia, including bleeding, infection, recurrence, remote possibility of temporary or permanent fecal incontinence, poor/delayed wound healing, chronic pain, and additional procedures to address said risks. The risks of general anesthetic, if used, includes MI, CVA, sudden death or even reaction to anesthetic medications also discussed.   We also discussed typical post operative recovery which includes weeks to potentially months of anal pain, drainage, occasional bleeding, and sense of fecal urgency.    ED return precautions given for sudden increase in pain, bleeding, with possible accompanying fever, nausea, and/or vomiting.  The patient understands the risks, any and all questions were answered to the patient's satisfaction.  2. Patient has elected to proceed with surgical treatment. Procedure will be scheduled. PT/INR ordered since could not find recent results in system.  LFTs and plts  WNL. This will be second hemorrhoidectomy for patient.  labs/images/medications/previous chart entries reviewed personally and relevant changes/updates noted above.

## 2023-04-29 NOTE — H&P (Signed)
 Subjective:  CC: Grade II hemorrhoids [K64.1]   HPI:  Randy Mclean is a 63 y.o. male who was referrred by [facility] for above. Symptoms were first noted several years ago. he has some rectal bleeding occurring several times per week. Bleeding is described as heavy. Mostly painless.  Currently receiving IV infusion for anemia.  GI workup including capsule endoscopy negative for anything significant.   Past Medical History:  has a past medical history of Acute myocardial infarction of anterolateral wall, initial episode of care (CMS/HHS-HCC), Arthritis, Asthma without status asthmaticus (HHS-HCC) (not sure), Barrett's esophagus, BPH with obstruction/lower urinary tract symptoms (06/29/2012), Chronic low back pain, COPD (chronic obstructive pulmonary disease) (CMS/HHS-HCC), Coronary artery disease, Depression, Diverticulosis, Dyspnea (09/07/2011), GERD (gastroesophageal reflux disease), Heart disease- Mild Heart Attack, History of blood transfusion, History of colonic polyps, History of MI (myocardial infarction) (11/21/2020), History of nephrolithiasis, Hormone deficiency (2000), Hyperlipidemia, mixed (05/18/2019), Hypertension, Hypothyroidism, IDA (iron deficiency anemia) (11/21/2020), Internal hemorrhoids, Iron deficiency anemia, Male hypogonadism, Narcolepsy (HHS-HCC), Osteoarthritis of carpometacarpal (CMC) joint of left thumb, Pneumonia, Prediabetes (11/21/2020), Seasonal allergies, Seizures (CMS/HHS-HCC), Spinal cord stimulator in situ (11/20/2020), Spinal stenosis of lumbar region without neurogenic claudication (11/20/2020), Stage 3 chronic kidney disease (CMS/HHS-HCC) (11/21/2020), and Ulcer.  Past Surgical History:  has a past surgical history that includes Neuroplasty Ulnar Nerve At Elbow (Left, 03/11/2011); Paranasal Sinusotomy (1999); lumbar surgery; Spinal cord stimulator placement (02/01/2008); Colonoscopy (02/10/2000); Colonoscopy (02/31/2004); Colonoscopy (12/23/2006); Colonoscopy (10/10/2009);  egd (04/06/2002); egd (05/06/2004); egd (12/23/2006); egd (10/10/2009); Colonoscopy (09/24/2013); egd (09/24/2013); Fracture surgery (1970); Back surgery (2004 & 2010); Appendectomy (11/2017); Eye surgery (Bilateral); Colon surgery (2016); Hemorrhoidectomy Internal & External; arthrodesis anterior cervicle spine (N/A, 09/19/2018); arthrodesis anterior cervicle spine (N/A, 09/19/2018); instrumentation anterior spine 4 to 7 segments (N/A, 09/19/2018); insertion structural bone allograft for spine surgery (N/A, 09/19/2018); egd (03/21/2020); Colonoscopy (03/21/2020); laminectomy posterior lumbar facetectomy & foraminotomy w/decomp (N/A, 12/04/2020); incision for implantation/replacement spinal neurostimulator (N/A, 12/04/2020); intraoperative flouroscopy (N/A, 12/04/2020); revision wrist arthroplasty (Left, 01/14/2021); graft tendon (Left, 01/14/2021); carpectomy (Left, 01/14/2021); intraoperative flouroscopy (Left, 01/14/2021); Colon @ [facility] (05/09/2022); and EGD @ [facility] (05/09/2022).  Family History: family history includes Alcohol abuse in his mother; Anemia in his maternal grandmother and paternal grandmother; Asthma in his father, maternal grandmother, mother, and paternal uncle; Breast cancer in his paternal aunt; COPD in his mother; Colon cancer in his sister; Coronary Artery Disease (Blocked arteries around heart) in his father, paternal grandfather, and paternal grandmother; Depression in his mother; Diabetes type II in his father, maternal grandmother, paternal grandmother, sister, and sister; Heart disease in his father, paternal grandfather, and paternal grandmother; High blood pressure (Hypertension) in his maternal grandfather, maternal grandmother, mother, and paternal aunt; Hyperlipidemia (Elevated cholesterol) in his father and mother; Kidney disease in his mother; Lung cancer in his mother; Myocardial Infarction (Heart attack) in his father; Osteoarthritis in his paternal aunt, paternal aunt, and  paternal grandmother; Reflux disease in his father, mother, and paternal aunt; Rheum arthritis in his paternal aunt, paternal aunt, and paternal grandmother; Stroke in his paternal uncle.  Social History:  reports that he quit smoking about 18 years ago. His smoking use included cigarettes. He started smoking about 51 years ago. He has a 34 pack-year smoking history. He has never used smokeless tobacco. He reports current alcohol use of about 1.0 standard drink of alcohol per week. He reports that he does not use drugs.  Current Medications: has a current medication list which includes the following prescription(s): alfuzosin, alprazolam, amlodipine, atorvastatin, benzonatate, breztri aerosphere,  bupropion, esomeprazole, fexofenadine, glucosamine/chondroitin/c/mang, hydrochlorothiazide, hydrocodone-acetaminophen, hydrocodone-chlorpheniramine, ipratropium-albuterol, levofloxacin, levothyroxine, lyrica, methocarbamol, metoprolol succinate, modafinil, multivitamin, sennosides, tizanidine, valsartan, dextroamphetamine-amphetamine, and oxycodone-acetaminophen.  Allergies:  Allergies as of 02/08/2023 - Reviewed 02/08/2023  Allergen Reaction Noted   Amoxicillin Rash 08/31/2011   Neurontin [gabapentin] Unknown 08/31/2011   Penicillins Rash 08/31/2011   Adhesive Rash 11/11/2014    ROS:  A 15 point review of systems was performed and pertinent positives and negatives noted in HPI  Objective:     BP 120/73   Pulse 81   Ht 182.9 cm (6' 0.01")   Wt 97.5 kg (214 lb 15.2 oz)   BMI 29.15 kg/m   Constitutional :  No distress, cooperative, alert  Lymphatics/Throat:  Supple with no lymphadenopathy  Respiratory:  Clear to auscultation bilaterally  Cardiovascular:  Regular rate and rhythm  Gastrointestinal: Soft, non-tender, non-distended, no organomegaly.  Musculoskeletal: Steady gait and movement  Skin: Cool and moist  Psychiatric: Normal affect, non-agitated, not confused  Rectal:  External exam  normal. DRE revealed, decreased rectal tone, with palpable internal hemorrhoid noted at LL and RP portion with minimal discomfort.  After obtaining verbal consent, an anoscope was inserted after prepped with lubricant and internal hemorrhoids noted on DRE confirmed visually, with no evidence of thrombosis. No other pathology such as fissures, fistulas, polyps noted.  Moderate bleeding from hemorrhoids noted at time of exam. Scope withdrawn and patient tolerate procedure well.       LABS:  N/a   RADS: N/a Assessment:     Grade II hemorrhoids [K64.1] - moderate bleeding after exam despite relatively benign appearing mucosa over hemorrhoids.  Recommend hemorrhoidectomy rather than banding to minimize post procedure bleeding issues.  Plan:    1. Grade II hemorrhoids [K64.1] Discussed risks/benefits/alternatives to surgery.  Alternatives include the options of observation, medical management.  Benefits include symptomatic relief.  I discussed  in detail and the complications related to the operation and the anesthesia, including bleeding, infection, recurrence, remote possibility of temporary or permanent fecal incontinence, poor/delayed wound healing, chronic pain, and additional procedures to address said risks. The risks of general anesthetic, if used, includes MI, CVA, sudden death or even reaction to anesthetic medications also discussed.   We also discussed typical post operative recovery which includes weeks to potentially months of anal pain, drainage, occasional bleeding, and sense of fecal urgency.    ED return precautions given for sudden increase in pain, bleeding, with possible accompanying fever, nausea, and/or vomiting.  The patient understands the risks, any and all questions were answered to the patient's satisfaction.  2. Patient has elected to proceed with surgical treatment. Procedure will be scheduled. PT/INR ordered since could not find recent results in system.  LFTs and plts  WNL. This will be second hemorrhoidectomy for patient.  labs/images/medications/previous chart entries reviewed personally and relevant changes/updates noted above.

## 2023-05-02 ENCOUNTER — Encounter
Admission: RE | Admit: 2023-05-02 | Discharge: 2023-05-02 | Disposition: A | Discharge status: Home / Self Care | Source: Ambulatory Visit | Attending: Surgery | Admitting: Surgery

## 2023-05-02 VITALS — Ht 72.0 in | Wt 214.9 lb

## 2023-05-02 DIAGNOSIS — I6523 Occlusion and stenosis of bilateral carotid arteries: Secondary | ICD-10-CM

## 2023-05-02 DIAGNOSIS — I10 Essential (primary) hypertension: Secondary | ICD-10-CM

## 2023-05-02 DIAGNOSIS — Z79899 Other long term (current) drug therapy: Secondary | ICD-10-CM

## 2023-05-02 DIAGNOSIS — Z01810 Encounter for preprocedural cardiovascular examination: Secondary | ICD-10-CM

## 2023-05-02 DIAGNOSIS — N1831 Chronic kidney disease, stage 3a: Secondary | ICD-10-CM

## 2023-05-02 DIAGNOSIS — Z01812 Encounter for preprocedural laboratory examination: Secondary | ICD-10-CM

## 2023-05-02 HISTORY — DX: Iron deficiency anemia, unspecified: D50.9

## 2023-05-02 NOTE — Patient Instructions (Addendum)
 Your procedure is scheduled on:05-06-23 Friday Report to the Registration Desk on the 1st floor of the [facility].Then proceed to the 2nd floor Surgery Desk To find out your arrival time, please call [phone] between 1PM - 3PM on:05-05-23 Thursday If your arrival time is 6:00 am, do not arrive before that time as the [facility] entrance doors do not open until 6:00 am.  REMEMBER: Instructions that are not followed completely may result in serious medical risk, up to and including death; or upon the discretion of your surgeon and anesthesiologist your surgery may need to be rescheduled.  Do not eat food after midnight the night before surgery.  No gum chewing or hard candies.  You may however, drink CLEAR liquids up to 2 hours before you are scheduled to arrive for your surgery. Do not drink anything within 2 hours of your scheduled arrival time.  Clear liquids include: - water  - apple juice without pulp - gatorade (not RED colors) - black coffee or tea (Do NOT add milk or creamers to the coffee or tea) Do NOT drink anything that is not on this list.  One week prior to surgery:Stop NOW (05-02-23) Stop Anti-inflammatories (NSAIDS) such as Advil, Aleve, Ibuprofen, Motrin, Naproxen, Naprosyn and Aspirin based products such as Excedrin, Goody's Powder, BC Powder. Stop ANY OVER THE COUNTER supplements until after surgery (multivitamin)  You may however, continue to take Tylenol if needed for pain up until the day of surgery.  Continue taking all of your other prescription medications up until the day of surgery.  ON THE DAY OF SURGERY ONLY TAKE THESE MEDICATIONS WITH SIPS OF WATER: -alfuzosin (UROXATRAL)  -amLODipine (NORVASC)  -buPROPion (WELLBUTRIN XL)  -esomeprazole (NEXIUM)   -levothyroxine (SYNTHROID)  -pregabalin (LYRICA)  -You may take ALPRAZolam Prudy Feeler) if needed for anxiety  Use your Duoneb (Breathing treatment) and your Breztri Inhaler at home the morning of  surgery  No Alcohol for 24 hours before or after surgery.  No Smoking including e-cigarettes for 24 hours before surgery.  No chewable tobacco products for at least 6 hours before surgery.  No nicotine patches on the day of surgery.  Do not use any "recreational" drugs for at least a week (preferably 2 weeks) before your surgery.  Please be advised that the combination of cocaine and anesthesia may have negative outcomes, up to and including death. If you test positive for cocaine, your surgery will be cancelled.  On the morning of surgery brush your teeth with toothpaste and water, you may rinse your mouth with mouthwash if you wish. Do not swallow any toothpaste or mouthwash.  Do not wear jewelry, make-up, hairpins, clips or nail polish.  For welded (permanent) jewelry: bracelets, anklets, waist bands, etc.  Please have this removed prior to surgery.  If it is not removed, there is a chance that hospital personnel will need to cut it off on the day of surgery.  Do not wear lotions, powders, or perfumes.   Do not shave body hair from the neck down 48 hours before surgery.  Contact lenses, hearing aids and dentures may not be worn into surgery.  Do not bring valuables to the hospital. [facility] is not responsible for any missing/lost belongings or valuables.   Notify your doctor if there is any change in your medical condition (cold, fever, infection).  Wear comfortable clothing (specific to your surgery type) to the hospital.  After surgery, you can help prevent lung complications by doing breathing exercises.  Take  deep breaths and cough every 1-2 hours. Your doctor may order a device called an Incentive Spirometer to help you take deep breaths. When coughing or sneezing, hold a pillow firmly against your incision with both hands. This is called "splinting." Doing this helps protect your incision. It also decreases belly discomfort.  If you are being admitted to the hospital  overnight, leave your suitcase in the car. After surgery it may be brought to your room.  In case of increased patient census, it may be necessary for you, the patient, to continue your postoperative care in the Same Day Surgery department.  If you are being discharged the day of surgery, you will not be allowed to drive home. You will need a responsible individual to drive you home and stay with you for 24 hours after surgery.   If you are taking public transportation, you will need to have a responsible individual with you.  Please call the Pre-admissions Testing Dept. at [phone] if you have any questions about these instructions.  Surgery Visitation Policy:  Patients having surgery or a procedure may have two visitors.  Children under the age of 88 must have an adult with them who is not the patient.  Temporary Visitor Restrictions Due to increasing cases of flu, RSV and COVID-19: Children ages 79 and under will not be able to visit patients in [facility] hospitals under most circumstances.

## 2023-05-03 ENCOUNTER — Encounter: Payer: Self-pay | Admitting: Surgery

## 2023-05-03 ENCOUNTER — Encounter
Admission: RE | Admit: 2023-05-03 | Discharge: 2023-05-03 | Disposition: A | Discharge status: Home / Self Care | Source: Ambulatory Visit | Attending: Surgery | Admitting: Surgery

## 2023-05-03 DIAGNOSIS — Z01810 Encounter for preprocedural cardiovascular examination: Secondary | ICD-10-CM

## 2023-05-03 DIAGNOSIS — Z01812 Encounter for preprocedural laboratory examination: Secondary | ICD-10-CM

## 2023-05-03 DIAGNOSIS — I129 Hypertensive chronic kidney disease with stage 1 through stage 4 chronic kidney disease, or unspecified chronic kidney disease: Secondary | ICD-10-CM | POA: Diagnosis not present

## 2023-05-03 DIAGNOSIS — I6523 Occlusion and stenosis of bilateral carotid arteries: Secondary | ICD-10-CM | POA: Insufficient documentation

## 2023-05-03 DIAGNOSIS — I10 Essential (primary) hypertension: Secondary | ICD-10-CM

## 2023-05-03 DIAGNOSIS — N1831 Chronic kidney disease, stage 3a: Secondary | ICD-10-CM | POA: Insufficient documentation

## 2023-05-03 DIAGNOSIS — Z01818 Encounter for other preprocedural examination: Secondary | ICD-10-CM | POA: Diagnosis present

## 2023-05-03 LAB — BASIC METABOLIC PANEL
Anion gap: 7 (ref 5–15)
BUN: 13 mg/dL (ref 6–20)
CO2: 28 mmol/L (ref 22–32)
Calcium: 8.4 mg/dL — ABNORMAL LOW (ref 8.9–10.3)
Chloride: 103 mmol/L (ref 98–111)
Creatinine, Ser: 1.16 mg/dL (ref 0.61–1.24)
GFR, Estimated: >60 mL/min (ref >60)
Glucose, Bld: 75 mg/dL (ref 70–99)
Potassium: 3.4 mmol/L — ABNORMAL LOW (ref 3.5–5.1)
Sodium: 138 mmol/L (ref 135–145)

## 2023-05-04 ENCOUNTER — Encounter: Payer: Self-pay | Admitting: Surgery

## 2023-05-04 NOTE — Progress Notes (Signed)
 Perioperative / Anesthesia Services  Pre-Admission Testing Clinical Review / Pre-Operative Anesthesia Consult  Date: 05/04/23  Patient Demographics:  Name: Randy Mclean DOB: [birth year] MRN:   [id number]  Planned Surgical Procedure(s):    Case: [id number] Date/Time: 05/06/23 0715   Procedure: HEMORRHOIDECTOMY (Groin)   Anesthesia type: General   Pre-op diagnosis: HEMORRHOID   Location: [facility] OR ROOM 05 / [facility]S FOR ANESTHESIA [facility]   Surgeons: Sung Amabile, DO      NOTE: Available PAT nursing documentation and vital signs have been reviewed. Clinical nursing staff has updated patient's PMH/PSHx, current medication list, and drug allergies/intolerances to ensure comprehensive history available to assist in medical decision making as it pertains to the aforementioned surgical procedure and anticipated anesthetic course. Extensive review of available clinical information personally performed. [place] [facility] and PSHx updated with any diagnoses/procedures that  may have been inadvertently omitted during his intake with the pre-admission testing department's nursing staff.  Clinical Discussion:  Randy Mclean is a 63 y.o. male who is submitted for pre-surgical anesthesia review and clearance prior to him undergoing the above procedure. Patient is a Former Smoker (30 pack years; quit 02/2005). Pertinent PMH includes: CAD, MI, diastolic dysfunction, atypical angina, BILATERAL carotid artery stenosis, aortic atherosclerosis, HTN, HLD, prediabetes, Hashimoto's thyroiditis, hypothyroidism, CKD-III, COPD, asthma, GERD (on daily PPI), Barrett's esophagus, IDA, narcolepsy, male hypogonadism (on TRT injections), hemorrhoids, OA, lumbar spinal stenosis (s/p SCS placement), nephrolithiasis, anxiety (on BZO), depression.  Patient is followed by cardiology Lorelle Gibbs, MD). He was last seen in the cardiology clinic on 07/26/2022; notes reviewed. At the time of his clinic visit, patient doing  well overall from a cardiovascular perspective. Patient denied any chest pain, shortness of breath, PND, orthopnea, palpitations, significant peripheral edema, weakness, fatigue, vertiginous symptoms, or presyncope/syncope. Patient with a past medical history significant for cardiovascular diagnoses. Documented physical exam was grossly benign, providing no evidence of acute exacerbation and/or decompensation of the patient's known cardiovascular conditions.  Of note, complete records regarding patient's cardiovascular history unavailable for review at time of consult.  Information gathered from patient report and from notes provided by his local care providers.  Patient reported to have suffered an acute anterolateral wall MI at the age of 73.  This would have been an approximately 1986.  Patient denies any cardiac catheterization or PCI procedures.  Exercise stress test was performed on 08/09/2011 at which time patient was stressed for total of 15 minutes.  There were rare (+) ST depressions noted during the study.  Patient experienced chest pain and shortness of breath, therefore study was truncated.  Coronary CTA was performed on 05/26/2012 that demonstrated an Agatston coronary artery calcium score of 0. Study demonstrates normal coronary origin with RIGHT dominance.  Cardiac stress test was performed on 06/11/2019 revealing normal left ventricular systolic function.  There was no evidence of stress-induced myocardial ischemia or arrhythmia.  Patient exercised for a total of 7 minutes and 5 seconds achieving 69% MPHR.  Patient achieved total of 8.6 METS during the study.  Most recent TTE performed on 05/08/2022 revealed a normal left ventricular systolic function with a hyperdynamic EF of 65-70%.  The left ventricular cavity size was mildly dilated.  There was mild concentric LVH. There were no regional wall motion abnormalities.  Left ventricular diastolic Doppler parameters were normal.  Right  ventricular size and function was normal.  Left atrium was mildly dilated. Right ventricular size and function normal with a TAPSE measuring 2.9 cm. There was trivial  mitral and tricuspid valve regurgitation. All transvalvular gradients were noted to be normal providing no evidence suggestive of valvular stenosis. Aorta normal in size with no evidence of ectasia or aneurysmal dilatation.  Blood pressure mildly elevated at 144/64 mmHg on currently prescribed CCB (amlodipine), diuretic (HCTZ), and beta-blocker (metoprolol succinate) therapies. Patient had been prescribed ARB (valsartan), however he reported to cardiologist that he had not taken that medication in at least a year patient is on atorvastatin for his HLD diagnosis and ASCVD prevention.  In the setting of known cardiovascular diagnoses, it is important note that patient is on exogenous testosterone injections (depotestosterone cypionate) for male hypogonadism.  Patient has a prediabetes diagnosis that he is managing with diet lifestyle modification.  Most recent hemoglobin A1c was 5.2% when checked on 04/27/2022.  Patient does not have an OSAH diagnosis. Patient is able to complete all of his  ADL/IADLs without cardiovascular limitation.  Per the [facility], patient is able to achieve at least 4 METS of physical activity without experiencing any significant degree of angina/anginal equivalent symptoms.  No changes were made to his medication regimen.  Patient to follow-up with outpatient cardiology in 1 year or sooner if needed.  Randy Mclean is scheduled for an elective HEMORRHOIDECTOMY on 05/06/2023 with Dr. Sung Amabile, DO.  Given patient's past medical history significant for cardiovascular diagnoses, presurgical cardiac clearance was sought by the PAT team. Per cardiology, "this patient is optimized for surgery and may proceed with the planned procedural course with a MODERATE risk of significant perioperative cardiovascular  complications".  In review of his medication reconciliation, the patient is not noted to be taking any type of anticoagulation or antiplatelet therapies that would need to be held during his perioperative course.  Patient denies previous perioperative complications with anesthesia in the past. In review his EMR, it is noted that patient underwent a general anesthetic course here at [facility] in 04/2022 without documented complications.      05/02/2023   12:00 PM 04/27/2023    1:12 PM 04/27/2023   12:58 PM  Vitals with BMI  Height 6\' 0"     Weight 214 lbs 15 oz    BMI 29.15    Systolic  135 141  Diastolic  66 67  Pulse  82 94   Providers/Specialists:  NOTE: Primary physician provider listed below. Patient may have been seen by APP or partner within same practice.   PROVIDER ROLE / SPECIALTY LAST Michelle Nasuti, DO General Surgery (Surgeon) 02/08/2023  Sallyanne Kuster, NP Primary Care Provider 04/14/2023  Tiajuana Amass, MD Cardiology 07/26/2022  Gerarda Fraction, MD Hematology 04/12/2023   Allergies:   Allergies  Allergen Reactions   Amoxicillin Anaphylaxis    REACTION: unspecified   Gabapentin Anaphylaxis   Penicillin G Anaphylaxis   Current Home Medications:   No current facility-administered medications for this encounter.    alfuzosin (UROXATRAL) 10 MG 24 hr tablet   ALPRAZolam (XANAX) 0.5 MG tablet   amLODipine (NORVASC) 10 MG tablet   atorvastatin (LIPITOR) 10 MG tablet   benzonatate (TESSALON) 200 MG capsule   Budeson-Glycopyrrol-Formoterol (BREZTRI AEROSPHERE) 160-9-4.8 MCG/ACT AERO   buPROPion (WELLBUTRIN XL) 300 MG 24 hr tablet   esomeprazole (NEXIUM) 40 MG capsule   fexofenadine (ALLEGRA) 180 MG tablet   FIBER GUMMIES PO   hydrochlorothiazide (HYDRODIURIL) 12.5 MG tablet   ipratropium-albuterol (DUONEB) 0.5-2.5 (3) MG/3ML SOLN   levothyroxine (SYNTHROID) 150 MCG tablet   methocarbamol (ROBAXIN) 750 MG  tablet    metoprolol succinate (TOPROL-XL) 25 MG 24 hr tablet   modafinil (PROVIGIL) 200 MG tablet   Multiple Vitamin (MULTI-VITAMINS) TABS   pregabalin (LYRICA) 100 MG capsule   senna (SENOKOT) 8.6 MG TABS tablet   tiZANidine (ZANAFLEX) 4 MG tablet   valsartan (DIOVAN) 80 MG tablet   SYRINGE-NEEDLE, DISP, 3 ML 22G X 1" 3 ML MISC   testosterone cypionate (DEPOTESTOSTERONE CYPIONATE) 200 MG/ML injection   History:   Past Medical History:  Diagnosis Date   Acute anterolateral wall MI (HCC)    a.) age of 47 (approx 66); denies LHC/PCI procedure   Acute appendicitis 12/04/2017   Allergy    takes allergy shots   Anxiety    a.) on BZO (alproazolam) PRN   Arthritis    Asthma    Atypical chest pain    Barrett esophagus    Bilateral carotid artery stenosis    BPH (benign prostatic hyperplasia)    Cervicalgia    Chronic pain    CKD (chronic kidney disease) stage 3, GFR 30-59 ml/min (HCC)    Colon polyp    COPD (chronic obstructive pulmonary disease) (HCC)    Coronary artery disease    Depression    Diastolic dysfunction    a.) TTE 06/11/2019: EF >55%, mild LVH, G1DD, mild BAE, mild RVE, triv TR/PR, mild MR   Dieulafoy lesion of colon    Diverticulosis    Former smoker    GERD (gastroesophageal reflux disease)    Hashimoto's thyroiditis    Hemorrhoid    History of kidney stones    Hyperlipidemia    Hypertension    Hypothyroid    IBS (irritable bowel syndrome)    IDA (iron deficiency anemia)    a.) has required parenteral iron infusions in the past   Left carpal tunnel syndrome    Male hypogonadism    a.) on exogenous TRT injections (depotestosterone cypionate)   Migraine    Narcolepsy    a.) takes modafinil   Pneumonia    Pre-diabetes    Right rotator cuff tendonitis    Seizures (HCC)    Spinal stenosis of lumbar region    a.) s/p spinal cord stimulator placement   Status post insertion of spinal cord stimulator    Past Surgical History:  Procedure Laterality Date    ANTERIOR CERVICAL DISCECTOMY     APPENDECTOMY     BACK SURGERY     BACK SURGERY  12/04/2020   COLONOSCOPY  09/2013   COLONOSCOPY N/A 05/09/2022   Procedure: COLONOSCOPY;  Surgeon: Jaynie Collins, DO;  Location: [facility] ENDOSCOPY;  Service: Gastroenterology;  Laterality: N/A;   elbow surgery     ESOPHAGOGASTRODUODENOSCOPY (EGD) WITH PROPOFOL N/A 05/09/2022   Procedure: ESOPHAGOGASTRODUODENOSCOPY (EGD) WITH PROPOFOL;  Surgeon: Jaynie Collins, DO;  Location: [facility] ENDOSCOPY;  Service: Gastroenterology;  Laterality: N/A;   GIVENS CAPSULE STUDY N/A 05/09/2022   Procedure: GIVENS CAPSULE STUDY;  Surgeon: Jaynie Collins, DO;  Location: [facility] ENDOSCOPY;  Service: Gastroenterology;  Laterality: N/A;   HEMORRHOID SURGERY     LAPAROSCOPIC APPENDECTOMY N/A 12/04/2017   Procedure: APPENDECTOMY LAPAROSCOPIC;  Surgeon: Ancil Linsey, MD;  Location: [facility] ORS;  Service: General;  Laterality: N/A;   LIPOMA EXCISION Right    leg   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     SPINAL CORD STIMULATOR IMPLANT     UPPER GI ENDOSCOPY  09/2013   WRIST SURGERY Left 01/14/2021   Family History  Problem Relation Age of  Onset   Hypertension Mother    Lung disease Mother    Heart attack Father    Diabetes Father    Liver cancer Sister    Lung cancer Sister    Colon cancer Sister    Uterine cancer Sister    Social History   Tobacco Use   Smoking status: Former    Current packs/day: 0.00    Average packs/day: 1 pack/day for 30.0 years (30.0 ttl pk-yrs)    Types: Cigarettes    Start date: 02/23/1975    Quit date: 02/22/2005    Years since quitting: 18.2   Smokeless tobacco: Never  Substance Use Topics   Alcohol use: Yes    Alcohol/week: 0.0 standard drinks of alcohol    Comment: occasionally   Pertinent Clinical Results:  LABS:  Hospital Outpatient Visit on 05/03/2023  Component Date Value Ref Range Status   Sodium 05/03/2023 138  135 - 145 mmol/L Final   Potassium 05/03/2023 3.4  (L)  3.5 - 5.1 mmol/L Final   Chloride 05/03/2023 103  98 - 111 mmol/L Final   CO2 05/03/2023 28  22 - 32 mmol/L Final   Glucose, Bld 05/03/2023 75  70 - 99 mg/dL Final   Glucose reference range applies only to samples taken after fasting for at least 8 hours.   BUN 05/03/2023 13  6 - 20 mg/dL Final   Creatinine, Ser 05/03/2023 1.16  0.61 - 1.24 mg/dL Final   Calcium 82/95/6213 8.4 (L)  8.9 - 10.3 mg/dL Final   GFR, Estimated 05/03/2023 >60  >60 mL/min Final   Comment: (NOTE) Calculated using the CKD-EPI Creatinine Equation (2021)    Anion gap 05/03/2023 7  5 - 15 Final   Performed at [facility], [address] [place], [place] [address]  Lab on 04/22/2023  Component Date Value Ref Range Status   Prostate Specific Ag, Serum 04/22/2023 1.7  0.0 - 4.0 ng/mL Final   Comment: Roche ECLIA methodology. According to the American Urological Association, Serum PSA should decrease and remain at undetectable levels after radical prostatectomy. The AUA defines biochemical recurrence as an initial PSA value 0.2 ng/mL or greater followed by a subsequent confirmatory PSA value 0.2 ng/mL or greater. Values obtained with different assay methods or kits cannot be used interchangeably. Results cannot be interpreted as absolute evidence of the presence or absence of malignant disease.   Appointment on 04/11/2023  Component Date Value Ref Range Status   Blood Bank Specimen 04/11/2023 SAMPLE AVAILABLE FOR TESTING   Final   Sample Expiration 04/11/2023    Final                   Value:04/14/2023,2359 Performed at [facility], [address] [place], [place] [address]    Iron 04/11/2023 98  45 - 182 ug/dL Final   TIBC 29/52/8413 402  250 - 450 ug/dL Final   Saturation Ratios 04/11/2023 24  17.9 - 39.5 % Final   UIBC 04/11/2023 304  ug/dL Final   Performed at [facility], [address] [place], [place] [address]   Ferritin 04/11/2023 18 (L)  24 - 336 ng/mL Final    Performed at [facility], [address] [place], [place] [address]   WBC 04/11/2023 9.6  4.0 - 10.5 K/uL Final   RBC 04/11/2023 3.86 (L)  4.22 - 5.81 MIL/uL Final   Hemoglobin 04/11/2023 9.6 (L)  13.0 - 17.0 g/dL Final   HCT 36/64/4034 32.5 (L)  39.0 - 52.0 % Final   MCV 04/11/2023 84.2  80.0 - 100.0 fL Final   MCH 04/11/2023 24.9 (L)  26.0 - 34.0 pg Final   MCHC 04/11/2023 29.5 (L)  30.0 - 36.0 g/dL Final   RDW 28/41/3244 18.6 (H)  11.5 - 15.5 % Final   Platelets 04/11/2023 275  150 - 400 K/uL Final   nRBC 04/11/2023 0.0  0.0 - 0.2 % Final   Neutrophils Relative % 04/11/2023 77  % Final   Neutro Abs 04/11/2023 7.4  1.7 - 7.7 K/uL Final   Lymphocytes Relative 04/11/2023 13  % Final   Lymphs Abs 04/11/2023 1.2  0.7 - 4.0 K/uL Final   Monocytes Relative 04/11/2023 8  % Final   Monocytes Absolute 04/11/2023 0.8  0.1 - 1.0 K/uL Final   Eosinophils Relative 04/11/2023 1  % Final   Eosinophils Absolute 04/11/2023 0.1  0.0 - 0.5 K/uL Final   Basophils Relative 04/11/2023 1  % Final   Basophils Absolute 04/11/2023 0.1  0.0 - 0.1 K/uL Final   Immature Granulocytes 04/11/2023 0  % Final   Abs Immature Granulocytes 04/11/2023 0.04  0.00 - 0.07 K/uL Final   Performed at [facility], [address] [place], [place] [address]    ECG: Date: 05/03/2023  Time ECG obtained: 0821 AM Rate: 81 bpm Rhythm: normal sinus Axis (leads I and aVF): normal Intervals: PR 168 ms. QRS 104 ms. QTc 446 ms. ST segment and T wave changes: No evidence of acute T wave abnormalities or significant ST segment elevation or depression.  Evidence of a possible, age undetermined, prior infarct:  No Comparison: Similar to previous tracing obtained on 05/07/2022   IMAGING / PROCEDURES: DG CHEST 2 VIEW performed on 01/10/2023 Hyperinflation.  Some chronic lung changes identified.  No consolidation, pneumothorax or effusion. No edema. Normal cardiopericardial silhouette.  Fixation hardware along the  lower cervical spine.  Neurostimulator leads along the lower thoracic spine central canal. No acute cardiopulmonary disease.  CT CHEST WO CONTRAST performed on 09/08/2022 No acute findings in the chest. Mild paraseptal and centrilobular emphysema. Aortic atherosclerosis  TRANSTHORACIC ECHOCARDIOGRAM performed on 05/08/2022 Left ventricular ejection fraction, by estimation, is 65 to 70%. The left ventricle has normal function. The left ventricle has no regional wall motion abnormalities. The left ventricular internal cavity size was mildly dilated. There is mild concentric left ventricular hypertrophy. Left ventricular diastolic parameters were normal.  Right ventricular systolic function is normal. The right ventricular size is normal.  Left atrial size was mildly dilated.  The mitral valve is normal in structure. Trivial mitral valve regurgitation.  The aortic valve is normal in structure. Aortic valve regurgitation is not visualized.   CT LUMBAR SPINE W CONTRAST performed on 09/16/2020 Severe multifactorial spinal stenosis and right neural foraminal stenosis at L4-5. Moderate right neural foraminal stenosis at L3-4. Aortic atherosclerosis  EXERCISE STRESS TEST performed on 06/11/2019 Stress duration 7 minutes and 5 seconds  Achieved a total of 8.6 METS Met 69% of his MPHR Overall normal study with no evidence of ischemia  TRANSTHORACIC ECHOCARDIOGRAM performed on 06/11/2019 Normal left ventricular systolic function with an EF of >55% Mild concentric LVH Left ventricular diastolic Doppler parameters consistent with abnormal relaxation (G1DD). Mild biatrial enlargement Mild RIGHT ventricular enlargement Trivial TR and PR Mild MR Normal gradients; no valvular stenosis No pericardial effusion  Impression and Plan:  Randy Mclean has been referred for pre-anesthesia review and clearance prior to him undergoing the planned anesthetic  and procedural courses. Available labs,  pertinent testing, and imaging results were personally reviewed by me in preparation for upcoming operative/procedural course. [facility] medical record has been updated following extensive record review and patient interview with PAT staff.   This patient has been appropriately cleared by cardiology with an overall MODERATE risk of experiencing significant perioperative cardiovascular complications. Based on clinical review performed today (05/04/23), barring any significant acute changes in the patient's overall condition, it is anticipated that he will be able to proceed with the planned surgical intervention. Any acute changes in clinical condition may necessitate his procedure being postponed and/or cancelled. Patient will meet with anesthesia team (MD and/or CRNA) on the day of his procedure for preoperative evaluation/assessment. Questions regarding anesthetic course will be fielded at that time.   Pre-surgical instructions were reviewed with the patient during his PAT appointment, and questions were fielded to satisfaction by PAT clinical staff. He has been instructed on which medications that he will need to hold prior to surgery, as well as the ones that have been deemed safe/appropriate to take on the day of his procedure. As part of the general education provided by PAT, patient made aware both verbally and in writing, that he would need to abstain from the use of any illegal substances during his perioperative course. He was advised that failure to follow the provided instructions could necessitate case cancellation or result in serious perioperative complications up to and including death. Patient encouraged to contact PAT and/or his surgeon's office to discuss any questions or concerns that may arise prior to surgery; verbalized understanding.   Quentin Mulling, MSN, APRN, FNP-C, CEN [facility]  Perioperative Services Nurse Practitioner Phone: [phone] Fax: [phone] 05/04/23 9:29 AM  NOTE: This note has been prepared using Dragon dictation software. Despite my best ability to proofread, there is always the potential that unintentional transcriptional errors may still occur from this process.

## 2023-05-05 MED ORDER — CHLORHEXIDINE GLUCONATE 0.12 % MT SOLN
15.0000 mL | Freq: Once | OROMUCOSAL | Status: AC
  Administered 2023-05-06: 15 mL via OROMUCOSAL

## 2023-05-05 MED ORDER — ORAL CARE MOUTH RINSE: 15.0000 mL | Freq: Once | OROMUCOSAL | Status: AC

## 2023-05-05 MED ORDER — LACTATED RINGERS IV SOLN: INTRAVENOUS | Status: DC

## 2023-05-05 MED ORDER — CHLORHEXIDINE GLUCONATE CLOTH 2 % EX PADS: 6.0000 | MEDICATED_PAD | Freq: Once | CUTANEOUS | Status: DC

## 2023-05-06 ENCOUNTER — Ambulatory Visit: Payer: Self-pay | Admitting: Urgent Care

## 2023-05-06 ENCOUNTER — Other Ambulatory Visit: Payer: Self-pay

## 2023-05-06 ENCOUNTER — Ambulatory Visit
Admission: RE | Admit: 2023-05-06 | Discharge: 2023-05-06 | Disposition: A | Discharge status: Home / Self Care | Payer: No Typology Code available for payment source | Attending: Surgery | Admitting: Surgery

## 2023-05-06 ENCOUNTER — Encounter
Admission: RE | Disposition: A | Discharge status: Home / Self Care | Payer: Self-pay | Source: Home / Self Care | Attending: Surgery

## 2023-05-06 ENCOUNTER — Encounter: Payer: Self-pay | Admitting: Surgery

## 2023-05-06 DIAGNOSIS — G47419 Narcolepsy without cataplexy: Secondary | ICD-10-CM | POA: Insufficient documentation

## 2023-05-06 DIAGNOSIS — Z87891 Personal history of nicotine dependence: Secondary | ICD-10-CM | POA: Diagnosis not present

## 2023-05-06 DIAGNOSIS — F32A Depression, unspecified: Secondary | ICD-10-CM | POA: Diagnosis not present

## 2023-05-06 DIAGNOSIS — K641 Second degree hemorrhoids: Secondary | ICD-10-CM | POA: Diagnosis present

## 2023-05-06 DIAGNOSIS — N183 Chronic kidney disease, stage 3 unspecified: Secondary | ICD-10-CM | POA: Diagnosis not present

## 2023-05-06 DIAGNOSIS — K625 Hemorrhage of anus and rectum: Secondary | ICD-10-CM | POA: Insufficient documentation

## 2023-05-06 DIAGNOSIS — E063 Autoimmune thyroiditis: Secondary | ICD-10-CM | POA: Diagnosis not present

## 2023-05-06 DIAGNOSIS — J4489 Other specified chronic obstructive pulmonary disease: Secondary | ICD-10-CM | POA: Insufficient documentation

## 2023-05-06 DIAGNOSIS — I2510 Atherosclerotic heart disease of native coronary artery without angina pectoris: Secondary | ICD-10-CM | POA: Insufficient documentation

## 2023-05-06 DIAGNOSIS — I252 Old myocardial infarction: Secondary | ICD-10-CM | POA: Insufficient documentation

## 2023-05-06 DIAGNOSIS — Z79899 Other long term (current) drug therapy: Secondary | ICD-10-CM | POA: Insufficient documentation

## 2023-05-06 DIAGNOSIS — D649 Anemia, unspecified: Secondary | ICD-10-CM | POA: Insufficient documentation

## 2023-05-06 DIAGNOSIS — K219 Gastro-esophageal reflux disease without esophagitis: Secondary | ICD-10-CM | POA: Insufficient documentation

## 2023-05-06 DIAGNOSIS — E785 Hyperlipidemia, unspecified: Secondary | ICD-10-CM | POA: Diagnosis not present

## 2023-05-06 DIAGNOSIS — F419 Anxiety disorder, unspecified: Secondary | ICD-10-CM | POA: Insufficient documentation

## 2023-05-06 DIAGNOSIS — R053 Chronic cough: Secondary | ICD-10-CM

## 2023-05-06 DIAGNOSIS — I129 Hypertensive chronic kidney disease with stage 1 through stage 4 chronic kidney disease, or unspecified chronic kidney disease: Secondary | ICD-10-CM | POA: Insufficient documentation

## 2023-05-06 DIAGNOSIS — M1990 Unspecified osteoarthritis, unspecified site: Secondary | ICD-10-CM | POA: Diagnosis not present

## 2023-05-06 HISTORY — DX: Atherosclerosis of aorta: I70.0

## 2023-05-06 HISTORY — DX: Cervicalgia: M54.2

## 2023-05-06 HISTORY — DX: Testicular hypofunction: E29.1

## 2023-05-06 HISTORY — DX: ST elevation (STEMI) myocardial infarction involving other coronary artery of anterior wall: I21.09

## 2023-05-06 HISTORY — DX: Other ill-defined heart diseases: I51.89

## 2023-05-06 HISTORY — DX: Presence of other specified functional implants: Z96.89

## 2023-05-06 HISTORY — DX: Other chest pain: R07.89

## 2023-05-06 HISTORY — PX: HEMORRHOID SURGERY: SHX153

## 2023-05-06 HISTORY — DX: Anxiety disorder, unspecified: F41.9

## 2023-05-06 HISTORY — DX: Dieulafoy lesion of intestine: K63.81

## 2023-05-06 HISTORY — DX: Other shoulder lesions, right shoulder: M75.81

## 2023-05-06 HISTORY — DX: Carpal tunnel syndrome, left upper limb: G56.02

## 2023-05-06 HISTORY — DX: Autoimmune thyroiditis: E06.3

## 2023-05-06 SURGERY — HEMORRHOIDECTOMY
Anesthesia: General | Site: Groin

## 2023-05-06 MED ORDER — MIDAZOLAM HCL 2 MG/2ML IJ SOLN
INTRAMUSCULAR | Status: AC
  Filled 2023-05-06: qty 2

## 2023-05-06 MED ORDER — ACETAMINOPHEN 10 MG/ML IV SOLN
INTRAVENOUS | Status: DC | PRN
  Administered 2023-05-06: 1000 mg via INTRAVENOUS

## 2023-05-06 MED ORDER — OXYCODONE-ACETAMINOPHEN 5-325 MG PO TABS: 1.0000 | ORAL_TABLET | Freq: Three times a day (TID) | ORAL | 0 refills | Status: DC | PRN

## 2023-05-06 MED ORDER — ACETAMINOPHEN 325 MG PO TABS: 650.0000 mg | ORAL_TABLET | Freq: Three times a day (TID) | ORAL | 0 refills | Status: AC | PRN

## 2023-05-06 MED ORDER — ONDANSETRON HCL 4 MG/2ML IJ SOLN
INTRAMUSCULAR | Status: AC
  Filled 2023-05-06: qty 2

## 2023-05-06 MED ORDER — MIDAZOLAM HCL 2 MG/2ML IJ SOLN
INTRAMUSCULAR | Status: DC | PRN
  Administered 2023-05-06: 2 mg via INTRAVENOUS

## 2023-05-06 MED ORDER — MIDAZOLAM HCL 2 MG/2ML IJ SOLN
1.0000 mg | Freq: Once | INTRAMUSCULAR | Status: AC
  Administered 2023-05-06: 1 mg via INTRAVENOUS

## 2023-05-06 MED ORDER — BUPIVACAINE LIPOSOME 1.3 % IJ SUSP
INTRAMUSCULAR | Status: DC | PRN
  Administered 2023-05-06: 20 mL

## 2023-05-06 MED ORDER — PHENYLEPHRINE 80 MCG/ML (10ML) SYRINGE FOR IV PUSH (FOR BLOOD PRESSURE SUPPORT)
PREFILLED_SYRINGE | INTRAVENOUS | Status: AC
  Filled 2023-05-06: qty 10

## 2023-05-06 MED ORDER — BUPIVACAINE-EPINEPHRINE (PF) 0.5% -1:200000 IJ SOLN
INTRAMUSCULAR | Status: DC | PRN
  Administered 2023-05-06: 20 mL

## 2023-05-06 MED ORDER — ONDANSETRON HCL 4 MG/2ML IJ SOLN
INTRAMUSCULAR | Status: DC | PRN
  Administered 2023-05-06: 4 mg via INTRAVENOUS

## 2023-05-06 MED ORDER — MIDAZOLAM HCL 2 MG/2ML IJ SOLN
INTRAMUSCULAR | Status: AC
Start: 2023-05-06 — End: ?
  Filled 2023-05-06: qty 2

## 2023-05-06 MED ORDER — PROPOFOL 10 MG/ML IV BOLUS
INTRAVENOUS | Status: DC | PRN
  Administered 2023-05-06: 200 mg via INTRAVENOUS
  Administered 2023-05-06: 125 ug/kg/min via INTRAVENOUS

## 2023-05-06 MED ORDER — PROPOFOL 10 MG/ML IV BOLUS
INTRAVENOUS | Status: AC
  Filled 2023-05-06: qty 20

## 2023-05-06 MED ORDER — KETOROLAC TROMETHAMINE 30 MG/ML IJ SOLN
INTRAMUSCULAR | Status: AC
  Filled 2023-05-06: qty 1

## 2023-05-06 MED ORDER — BENZONATATE 200 MG PO CAPS: 200.0000 mg | ORAL_CAPSULE | Freq: Every day | ORAL | Status: DC | PRN

## 2023-05-06 MED ORDER — CHLORHEXIDINE GLUCONATE 0.12 % MT SOLN
OROMUCOSAL | Status: AC
  Filled 2023-05-06: qty 15

## 2023-05-06 MED ORDER — DOCUSATE SODIUM 100 MG PO CAPS: 100.0000 mg | ORAL_CAPSULE | Freq: Two times a day (BID) | ORAL | 0 refills | Status: AC | PRN

## 2023-05-06 MED ORDER — DEXAMETHASONE SODIUM PHOSPHATE 10 MG/ML IJ SOLN
INTRAMUSCULAR | Status: AC
  Filled 2023-05-06: qty 1

## 2023-05-06 MED ORDER — FENTANYL CITRATE (PF) 100 MCG/2ML IJ SOLN: 25.0000 ug | INTRAMUSCULAR | Status: DC | PRN

## 2023-05-06 MED ORDER — FENTANYL CITRATE (PF) 100 MCG/2ML IJ SOLN
INTRAMUSCULAR | Status: AC
  Filled 2023-05-06: qty 2

## 2023-05-06 MED ORDER — GELATIN ABSORBABLE 12-7 MM EX MISC
CUTANEOUS | Status: DC | PRN
  Administered 2023-05-06: 1

## 2023-05-06 MED ORDER — LIDOCAINE HCL (CARDIAC) PF 100 MG/5ML IV SOSY
PREFILLED_SYRINGE | INTRAVENOUS | Status: DC | PRN
  Administered 2023-05-06: 100 mg via INTRAVENOUS

## 2023-05-06 MED ORDER — BUPIVACAINE-EPINEPHRINE (PF) 0.5% -1:200000 IJ SOLN
INTRAMUSCULAR | Status: AC
  Filled 2023-05-06: qty 30

## 2023-05-06 MED ORDER — BUPIVACAINE LIPOSOME 1.3 % IJ SUSP
INTRAMUSCULAR | Status: AC
  Filled 2023-05-06: qty 20

## 2023-05-06 MED ORDER — ACETAMINOPHEN 10 MG/ML IV SOLN
INTRAVENOUS | Status: AC
  Filled 2023-05-06: qty 100

## 2023-05-06 MED ORDER — OXYCODONE HCL 5 MG/5ML PO SOLN: 5.0000 mg | Freq: Once | ORAL | Status: AC | PRN

## 2023-05-06 MED ORDER — PROPOFOL 1000 MG/100ML IV EMUL
INTRAVENOUS | Status: AC
  Filled 2023-05-06: qty 100

## 2023-05-06 MED ORDER — OXYCODONE HCL 5 MG PO TABS
5.0000 mg | ORAL_TABLET | Freq: Once | ORAL | Status: AC | PRN
  Administered 2023-05-06: 5 mg via ORAL

## 2023-05-06 MED ORDER — IBUPROFEN 800 MG PO TABS: 800.0000 mg | ORAL_TABLET | Freq: Three times a day (TID) | ORAL | 0 refills | Status: DC | PRN

## 2023-05-06 MED ORDER — EPHEDRINE 5 MG/ML INJ
INTRAVENOUS | Status: AC
  Filled 2023-05-06: qty 5

## 2023-05-06 MED ORDER — OXYCODONE HCL 5 MG PO TABS
ORAL_TABLET | ORAL | Status: AC
  Filled 2023-05-06: qty 1

## 2023-05-06 MED ORDER — KETOROLAC TROMETHAMINE 30 MG/ML IJ SOLN
INTRAMUSCULAR | Status: DC | PRN
  Administered 2023-05-06: 30 mg via INTRAVENOUS

## 2023-05-06 MED ORDER — LIDOCAINE HCL (PF) 2 % IJ SOLN
INTRAMUSCULAR | Status: AC
  Filled 2023-05-06: qty 5

## 2023-05-06 MED ORDER — DEXAMETHASONE SODIUM PHOSPHATE 10 MG/ML IJ SOLN
INTRAMUSCULAR | Status: DC | PRN
  Administered 2023-05-06: 8 mg via INTRAVENOUS

## 2023-05-06 MED ORDER — ONDANSETRON HCL 4 MG/2ML IJ SOLN: 4.0000 mg | Freq: Once | INTRAMUSCULAR | Status: DC | PRN

## 2023-05-06 MED ORDER — FENTANYL CITRATE (PF) 100 MCG/2ML IJ SOLN
INTRAMUSCULAR | Status: DC | PRN
  Administered 2023-05-06 (×2): 50 ug via INTRAVENOUS

## 2023-05-06 MED ORDER — PHENYLEPHRINE 80 MCG/ML (10ML) SYRINGE FOR IV PUSH (FOR BLOOD PRESSURE SUPPORT)
PREFILLED_SYRINGE | INTRAVENOUS | Status: DC | PRN
Start: 2023-05-06 — End: 2023-05-06
  Administered 2023-05-06 (×2): 80 ug via INTRAVENOUS

## 2023-05-06 MED ORDER — GELATIN ABSORBABLE 12-7 MM EX MISC
CUTANEOUS | Status: AC
  Filled 2023-05-06: qty 1

## 2023-05-06 MED ORDER — LIDOCAINE 5 % EX OINT: 1.0000 | TOPICAL_OINTMENT | Freq: Three times a day (TID) | CUTANEOUS | 0 refills | Status: DC | PRN

## 2023-05-06 SURGICAL SUPPLY — 29 items
BLADE SURG 15 STRL LF DISP TIS (BLADE) ×1 IMPLANT
BRIEF MESH DISP 2XL (UNDERPADS AND DIAPERS) ×1 IMPLANT
DRAPE PERI LITHO V/GYN (MISCELLANEOUS) ×1 IMPLANT
DRAPE UNDER BUTTOCK W/FLU (DRAPES) ×1 IMPLANT
DRSG GAUZE FLUFF 36X18 (GAUZE/BANDAGES/DRESSINGS) ×1 IMPLANT
ELECT REM PT RETURN 9FT ADLT (ELECTROSURGICAL) ×1 IMPLANT
ELECTRODE REM PT RTRN 9FT ADLT (ELECTROSURGICAL) ×1 IMPLANT
GAUZE 4X4 16PLY ~~LOC~~+RFID DBL (SPONGE) IMPLANT
GLOVE BIOGEL PI IND STRL 7.0 (GLOVE) ×1 IMPLANT
GLOVE SURG SYN 6.5 ES PF (GLOVE) ×3 IMPLANT
GLOVE SURG SYN 6.5 PF PI (GLOVE) ×1 IMPLANT
GOWN STRL REUS W/ TWL LRG LVL3 (GOWN DISPOSABLE) ×2 IMPLANT
KIT TURNOVER CYSTO (KITS) ×1 IMPLANT
LABEL OR SOLS (LABEL) ×1 IMPLANT
MANIFOLD NEPTUNE II (INSTRUMENTS) ×1 IMPLANT
NDL HYPO 22X1.5 SAFETY MO (MISCELLANEOUS) ×1 IMPLANT
NEEDLE HYPO 22X1.5 SAFETY MO (MISCELLANEOUS) ×1 IMPLANT
NS IRRIG 500ML POUR BTL (IV SOLUTION) ×1 IMPLANT
PACK BASIN MINOR ARMC (MISCELLANEOUS) ×1 IMPLANT
PAD PREP OB/GYN DISP 24X41 (PERSONAL CARE ITEMS) ×1 IMPLANT
SHEARS HARMONIC 9CM CVD (BLADE) IMPLANT
SOL PREP PVP 2OZ (MISCELLANEOUS) ×1 IMPLANT
SOLUTION PREP PVP 2OZ (MISCELLANEOUS) ×1 IMPLANT
SURGILUBE 2OZ TUBE FLIPTOP (MISCELLANEOUS) ×1 IMPLANT
SUT VIC AB 3-0 SH 27X BRD (SUTURE) ×1 IMPLANT
SWAB CULTURE AMIES ANAERIB BLU (MISCELLANEOUS) IMPLANT
SYR 10ML LL (SYRINGE) ×1 IMPLANT
SYR 20ML LL LF (SYRINGE) ×1 IMPLANT
TRAP FLUID SMOKE EVACUATOR (MISCELLANEOUS) ×1 IMPLANT

## 2023-05-06 NOTE — Transfer of Care (Signed)
 Immediate Anesthesia Transfer of Care Note  Patient: Randy Mclean  Procedure(s) Performed: HEMORRHOIDECTOMY (Groin)  Patient Location: PACU  Anesthesia Type:General  Level of Consciousness: drowsy and responds to stimulation  Airway & Oxygen Therapy: Patient Spontanous Breathing and Patient connected to face mask oxygen  Post-op Assessment: Report given to RN and Post -op Vital signs reviewed and stable  Post vital signs: Reviewed and stable  Last Vitals:  Vitals Value Taken Time  BP 92/46 05/06/23 0822  Temp    Pulse 72 05/06/23 0826  Resp 15 05/06/23 0826  SpO2 99 % 05/06/23 0826  Vitals shown include unfiled device data.  Last Pain:  Vitals:   05/06/23 0628  TempSrc: Oral  PainSc: 0-No pain         Complications: No notable events documented.

## 2023-05-06 NOTE — Discharge Instructions (Signed)
 Hemorrhoids, Care After This sheet gives you information about how to care for yourself after your procedure. Your health care provider may also give you more specific instructions. If you have problems or questions, contact your health care provider. What can I expect after the procedure? After the procedure, it is common to have: Soreness. Bruising. Itching.  Follow these instructions at home: site care Follow instructions from your health care provider about how to take care of your site. Make sure you: LEAVE packing in place until it falls out on its own.  No need to replace afterwards Leave stitches (sutures), skin glue, or adhesive strips in place.  If the area bleeds or bruises, apply gentle pressure for 10 minutes. OK TO SHOWER IN 24HRS  General instructions Rest and then return to your normal activities as told by your health care provider. tylenol and advil as needed for discomfort.  Please alternate between the two every four hours as needed for pain.    Use narcotics, if prescribed, only when tylenol and motrin is not enough to control pain.  325-650mg  every 8hrs to max of 3000mg /24hrs (including the 325mg  in every norco dose) for the tylenol.    Advil up to 800mg  per dose every 8hrs as needed for pain.   Keep all follow-up visits as told by your health care provider. This is important. Contact a health care provider if you have excessive: redness, swelling, or pain around your site. blood coming from your site. pus or a bad smell coming from your site. You have a fever.  Get help right away if: You have bleeding that does not stop with pressure or a dressing. Summary After the procedure, it is common to have some soreness, bruising, and itching at the site. Follow instructions from your health care provider about how to take care of your site. Keep all follow-up visits as told by your health care provider. This is important. This information is not intended to replace  advice given to you by your health care provider. Make sure you discuss any questions you have with your health care provider. Document Released: 03/07/2015 Document Revised: 08/08/2017 Document Reviewed: 08/08/2017 Elsevier Interactive Patient Education  [employer].

## 2023-05-06 NOTE — Anesthesia Postprocedure Evaluation (Signed)
 Anesthesia Post Note  Patient: Randy Mclean Younger  Procedure(s) Performed: HEMORRHOIDECTOMY (Groin)  Patient location during evaluation: PACU Anesthesia Type: General Level of consciousness: awake and awake and alert Pain management: satisfactory to patient Vital Signs Assessment: post-procedure vital signs reviewed and stable Respiratory status: spontaneous breathing and nonlabored ventilation Cardiovascular status: stable Anesthetic complications: no   No notable events documented.   Last Vitals:  Vitals:   05/06/23 0628 05/06/23 0822  BP: (!) 140/57 (!) 92/46  Pulse: 76 77  Resp: 18 19  Temp: 36.6 C 36.9 C  SpO2: 95% 99%    Last Pain:  Vitals:   05/06/23 0822  TempSrc:   PainSc: Asleep                 VAN STAVEREN,Jamaine Quintin

## 2023-05-06 NOTE — Anesthesia Preprocedure Evaluation (Addendum)
 Anesthesia Evaluation  Patient identified by MRN, date of birth, ID band Patient awake    Reviewed: Allergy & Precautions, NPO status , Patient's Chart, lab work & pertinent test results  Airway Mallampati: II  TM Distance: >3 FB Neck ROM: full    Dental  (+) Teeth Intact   Pulmonary neg pulmonary ROS, shortness of breath, COPD, former smoker   Pulmonary exam normal  + decreased breath sounds      Cardiovascular hypertension, + CAD and + Past MI  negative cardio ROS Normal cardiovascular exam Rhythm:Regular Rate:Normal     Neuro/Psych Seizures -,   Anxiety     negative neurological ROS  negative psych ROS   GI/Hepatic negative GI ROS, Neg liver ROS,GERD  ,,  Endo/Other  negative endocrine ROSHypothyroidism    Renal/GU CRFRenal disease  negative genitourinary   Musculoskeletal   Abdominal Normal abdominal exam  (+)   Peds negative pediatric ROS (+)  Hematology negative hematology ROS (+)   Anesthesia Other Findings Past Medical History: No date: Acute anterolateral wall MI (HCC)     Comment:  a.) age of 89 (approx 46); denies LHC/PCI procedure 12/04/2017: Acute appendicitis No date: Allergy     Comment:  takes allergy shots No date: Anxiety     Comment:  a.) on BZO (alproazolam) PRN No date: Aortic atherosclerosis (HCC) No date: Arthritis No date: Asthma No date: Atypical chest pain No date: Barrett esophagus No date: Bilateral carotid artery stenosis No date: BPH (benign prostatic hyperplasia) No date: Cervicalgia No date: Chronic pain No date: CKD (chronic kidney disease) stage 3, GFR 30-59 ml/min (HCC) No date: Colon polyp No date: COPD (chronic obstructive pulmonary disease) (HCC) No date: Coronary artery disease No date: Depression No date: Diastolic dysfunction     Comment:  a.) TTE 06/11/2019: EF >55%, mild LVH, G1DD, mild BAE,               mild RVE, triv TR/PR, mild MR No date: Dieulafoy  lesion of colon No date: Diverticulosis No date: Former smoker No date: GERD (gastroesophageal reflux disease) No date: Hashimoto's thyroiditis No date: Hemorrhoid No date: History of kidney stones No date: Hyperlipidemia No date: Hypertension No date: Hypothyroid No date: IBS (irritable bowel syndrome) No date: IDA (iron deficiency anemia)     Comment:  a.) has required parenteral iron infusions No date: Left carpal tunnel syndrome No date: Male hypogonadism     Comment:  a.) on exogenous TRT injections (depotestosterone               cypionate) No date: Migraine No date: Narcolepsy     Comment:  a.) takes modafinil No date: Pneumonia No date: Pre-diabetes No date: Right rotator cuff tendonitis No date: Seizures (HCC) No date: Spinal stenosis of lumbar region     Comment:  a.) s/p spinal cord stimulator placement No date: Status post insertion of spinal cord stimulator  Past Surgical History: No date: ANTERIOR CERVICAL DISCECTOMY No date: APPENDECTOMY No date: BACK SURGERY 12/04/2020: BACK SURGERY 09/2013: COLONOSCOPY 05/09/2022: COLONOSCOPY; N/A     Comment:  Procedure: COLONOSCOPY;  Surgeon: Jaynie Collins,              DO;  Location: [facility] ENDOSCOPY;  Service:               Gastroenterology;  Laterality: N/A; No date: elbow surgery 05/09/2022: ESOPHAGOGASTRODUODENOSCOPY (EGD) WITH PROPOFOL; N/A     Comment:  Procedure: ESOPHAGOGASTRODUODENOSCOPY (EGD) WITH  PROPOFOL;  Surgeon: Jaynie Collins, DO;  Location:              [facility] ENDOSCOPY;  Service: Gastroenterology;  Laterality:               N/A; 05/09/2022: GIVENS CAPSULE STUDY; N/A     Comment:  Procedure: GIVENS CAPSULE STUDY;  Surgeon: Jaynie Collins, DO;  Location: [facility] ENDOSCOPY;  Service:               Gastroenterology;  Laterality: N/A; No date: HEMORRHOID SURGERY 12/04/2017: LAPAROSCOPIC APPENDECTOMY; N/A     Comment:  Procedure: APPENDECTOMY LAPAROSCOPIC;   Surgeon: Ancil Linsey, MD;  Location: [facility] ORS;  Service: General;                Laterality: N/A; No date: LIPOMA EXCISION; Right     Comment:  leg No date: 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 No date: SPINAL CORD STIMULATOR IMPLANT 09/2013: UPPER GI ENDOSCOPY 01/14/2021: WRIST SURGERY; Left  BMI    Body Mass Index: 29.15 kg/m      Reproductive/Obstetrics negative OB ROS                             Anesthesia Physical Anesthesia Plan  ASA: 3  Anesthesia Plan: General   Post-op Pain Management:    Induction: Intravenous  PONV Risk Score and Plan: Dexamethasone, Ondansetron, Midazolam and Treatment may vary due to age or medical condition  Airway Management Planned: LMA  Additional Equipment:   Intra-op Plan:   Post-operative Plan: Extubation in OR  Informed Consent: I have reviewed the patients History and Physical, chart, labs and discussed the procedure including the risks, benefits and alternatives for the proposed anesthesia with the patient or authorized representative who has indicated his/her understanding and acceptance.     Dental Advisory Given  Plan Discussed with: CRNA  Anesthesia Plan Comments:        Anesthesia Quick Evaluation

## 2023-05-06 NOTE — Interval H&P Note (Signed)
 No change. OK to proceed.

## 2023-05-06 NOTE — Op Note (Signed)
 Preoperative diagnosis: second degree hemorrhoids.   Postoperative diagnosis: second  degree hemorrhoids.  Procedure: exam under anesthesia, two column hemorrhoidectomy.  Surgeon: Tonna Boehringer  Anesthesia: general  Specimen: hemorrhoids x2  Complications: none  EBL: 15mL  Wound classification: Clean Contaminated  Indications: Patient is a 63 y.o. male was found to have symptomatic hemorrhoids refractory to medical management.   Findings: 1. Second  degree hemorrhoids, general friable mucosa 2. Internal and external anal sphincter palpated and preserved 3. Adequate hemostasis  Description of procedure: The patient was brought to the operating room and general anesthesia was induced. Patient was placed high lithotomy position. A time-out was completed verifying correct patient, procedure, site, positioning, and implant(s) and/or special equipment prior to beginning this procedure. The perineum was prepped and draped in standard sterile fashion. Local anesthetic was injected as a perianal block. An anoscope was introduced and hemorrhoidal pedicles identified. Overall mucosa throughout canal somewhat friable, with one area more irritated in RP column, less so on LL.  Decision made to proceed with two column resection.  A harmonic was placed across the base of the RP pedicle and excess hemorrhoidal tissue removed, encompassing the more irritated area.  Specimen was passed off operative field pending pathology.  3-0 Vicryl then used to close the open wound.  A harmonic was placed across the base of the LL pedicle and excess hemorrhoidal tissue removed.  Specimen was passed off operative field pending pathology.  3-0 Vicryl then used to close the open wound.  Hemostasis achieved with electrocautery in spots with bleeding.  Last inspection of the anal canal did not note any additional hemorrhoidal tissue and no other pathology. Exparel injected as a perianal block. A gauze pad was tucked between the  gluteal folds, and secured in place with mesh underwear.  The patient tolerated the procedure well and was taken to the postanesthesia care unit in stable condition.  Sponge and instrument count correct at end of procedure.

## 2023-05-06 NOTE — Anesthesia Procedure Notes (Signed)
 Procedure Name: LMA Insertion Date/Time: 05/06/2023 7:39 AM  Performed by: Rich Brave, CRNAPre-anesthesia Checklist: Patient identified, Emergency Drugs available, Suction available, Patient being monitored and Timeout performed Patient Re-evaluated:Patient Re-evaluated prior to induction Oxygen Delivery Method: Circle system utilized Preoxygenation: Pre-oxygenation with 100% oxygen Induction Type: IV induction Ventilation: Mask ventilation without difficulty LMA: LMA inserted LMA Size: 5.0 Tube type: Oral Number of attempts: 1 Placement Confirmation: ETT inserted through vocal cords under direct vision, positive ETCO2 and breath sounds checked- equal and bilateral Tube secured with: Tape Dental Injury: Teeth and Oropharynx as per pre-operative assessment

## 2023-05-07 ENCOUNTER — Encounter: Payer: Self-pay | Admitting: Surgery

## 2023-05-09 LAB — SURGICAL PATHOLOGY

## 2023-07-06 ENCOUNTER — Ambulatory Visit: Payer: No Typology Code available for payment source | Admitting: Nurse Practitioner

## 2023-07-06 VITALS — BP 126/78 | HR 79 | Temp 98.6°F | Resp 16 | Ht 72.0 in | Wt 221.6 lb

## 2023-07-06 DIAGNOSIS — G47429 Narcolepsy in conditions classified elsewhere without cataplexy: Secondary | ICD-10-CM

## 2023-07-06 DIAGNOSIS — E039 Hypothyroidism, unspecified: Secondary | ICD-10-CM

## 2023-07-06 DIAGNOSIS — Z79899 Other long term (current) drug therapy: Secondary | ICD-10-CM

## 2023-07-06 DIAGNOSIS — M25552 Pain in left hip: Secondary | ICD-10-CM

## 2023-07-06 DIAGNOSIS — M25551 Pain in right hip: Secondary | ICD-10-CM | POA: Diagnosis not present

## 2023-07-06 DIAGNOSIS — J432 Centrilobular emphysema: Secondary | ICD-10-CM

## 2023-07-06 MED ORDER — SUNOSI 150 MG PO TABS: 150.0000 mg | ORAL_TABLET | Freq: Every day | ORAL | 3 refills | Status: DC

## 2023-07-06 MED ORDER — ALPRAZOLAM 0.5 MG PO TABS: 0.5000 mg | ORAL_TABLET | Freq: Three times a day (TID) | ORAL | 2 refills | Status: DC | PRN

## 2023-07-06 MED ORDER — ESOMEPRAZOLE MAGNESIUM 40 MG PO CPDR: 40.0000 mg | DELAYED_RELEASE_CAPSULE | Freq: Two times a day (BID) | ORAL | 1 refills | Status: DC

## 2023-07-06 MED ORDER — LEVOTHYROXINE SODIUM 150 MCG PO TABS: 150.0000 ug | ORAL_TABLET | Freq: Every day | ORAL | 1 refills | Status: DC

## 2023-07-06 NOTE — Progress Notes (Signed)
 [facility] [address] [place], [place] [address]  Internal MEDICINE  Office Visit Note  Patient Name: Randy Mclean  [id number]  [id number]  Date of Service: 07/06/2023  Chief Complaint  Patient presents with   Follow-up    HPI Randy Mclean presents for a follow-up visit for narcolepsy, COPD, and bilateral hip pain.  Narcolepsy -- has tried adderall for the excessive daytime sleepiness but this was not helpful. He is currently taking modafinil  which helps some but does not have an optimal effect. We tried to get lumryz  approved last year but the process was drawn out and it was denied and would have required an appeal. Discussed Sunosi  which is a newer non stimulant that is used for excessive daytime sleepiness in patients with nacrolepsy. He is interested in trying this medication.  Had hemorrhoidectomy in march and is doing well COPD -- still using breztri  twice daily and his symptoms are well managed. His chronic cough remains significantly improved. At previous visits before breztri , he would have frequent coughing fits.  Bilateral hip pain -- reports that this started a couple of months ago. Reprots that he has sharp stabbing pains in both hips after standing for more than 5 minutes. Denies any pain when sitting or laying down. Reports that there is increased pain in both hips when walking.    Current Medication: Outpatient Encounter Medications as of 07/06/2023  Medication Sig   alfuzosin  (UROXATRAL ) 10 MG 24 hr tablet TAKE 1 TABLET (10 MG TOTAL) BY MOUTH IN THE MORNING AND AT BEDTIME   amLODipine  (NORVASC ) 10 MG tablet Take 1 tablet (10 mg total) by mouth daily. (Patient taking differently: Take 10 mg by mouth every morning.)   atorvastatin  (LIPITOR) 10 MG tablet Take 1 tablet (10 mg total) by mouth daily. (Patient taking differently: Take 10 mg by mouth at bedtime.)   Budeson-Glycopyrrol-Formoterol  (BREZTRI  AEROSPHERE) 160-9-4.8 MCG/ACT AERO Inhale 2 puffs into  the lungs in the morning and at bedtime.   buPROPion  (WELLBUTRIN  XL) 300 MG 24 hr tablet TAKE 1 TABLET BY MOUTH DAILY, GENERIC EQUIVALENT FOR WELLBUTRIN  XL. DUE FOR OFFICE VISIT. (Patient taking differently: 300 mg every morning. TAKE 1 TABLET BY MOUTH DAILY, GENERIC EQUIVALENT FOR WELLBUTRIN  XL. DUE FOR OFFICE VISIT.)   fexofenadine (ALLEGRA) 180 MG tablet Take 180 mg by mouth 2 (two) times daily.   FIBER GUMMIES PO Take 2 tablets by mouth daily.   hydrochlorothiazide  (HYDRODIURIL ) 12.5 MG tablet Take 1 tablet (12.5 mg total) by mouth daily. (Patient taking differently: Take 12.5 mg by mouth every morning.)   ipratropium-albuterol  (DUONEB) 0.5-2.5 (3) MG/3ML SOLN Take 3 mLs by nebulization every 6 (six) hours as needed (SOB/wheezing/cough).   methocarbamol  (ROBAXIN ) 750 MG tablet Take 750 mg by mouth 4 (four) times daily as needed for muscle spasms.   metoprolol  succinate (TOPROL -XL) 25 MG 24 hr tablet Take 25 mg by mouth at bedtime.   Multiple Vitamin (MULTI-VITAMINS) TABS Take 1 tablet by mouth daily.   pregabalin  (LYRICA ) 100 MG capsule Take 1 capsule (100 mg total) by mouth every 8 (eight) hours.   senna (SENOKOT) 8.6 MG TABS tablet Take 1 tablet by mouth at bedtime.   Solriamfetol  HCl (SUNOSI ) 150 MG TABS Take 1 tablet (150 mg total) by mouth daily before breakfast.   SYRINGE-NEEDLE, DISP, 3 ML 22G X 1 3 ML MISC Use as directed.   testosterone  cypionate (DEPOTESTOSTERONE CYPIONATE) 200 MG/ML injection Inject 0.7 mLs (140 mg total) into the muscle once a week.  tiZANidine  (ZANAFLEX ) 4 MG tablet Take 4 mg by mouth at bedtime.   valsartan  (DIOVAN ) 80 MG tablet Take 80 mg by mouth at bedtime.   [DISCONTINUED] ALPRAZolam  (XANAX ) 0.5 MG tablet Take 1 tablet (0.5 mg total) by mouth 3 (three) times daily as needed for anxiety.   [DISCONTINUED] benzonatate  (TESSALON ) 200 MG capsule Take 1 capsule (200 mg total) by mouth daily as needed for cough.   [DISCONTINUED] esomeprazole  (NEXIUM ) 40 MG capsule  Take 1 capsule (40 mg total) by mouth 2 (two) times daily before a meal.   [DISCONTINUED] ibuprofen  (ADVIL ) 800 MG tablet Take 1 tablet (800 mg total) by mouth every 8 (eight) hours as needed for mild pain (pain score 1-3) or moderate pain (pain score 4-6).   [DISCONTINUED] levothyroxine  (SYNTHROID ) 150 MCG tablet Take 1 tablet (150 mcg total) by mouth daily before breakfast.   [DISCONTINUED] lidocaine  (XYLOCAINE ) 5 % ointment Apply 1 Application topically 3 (three) times daily as needed. Apply pea size amount to area as needed   [DISCONTINUED] modafinil  (PROVIGIL ) 200 MG tablet Take 1 tablet (200 mg total) by mouth daily.   [DISCONTINUED] oxyCODONE -acetaminophen  (PERCOCET) 5-325 MG tablet Take 1 tablet by mouth every 8 (eight) hours as needed for severe pain (pain score 7-10).   ALPRAZolam  (XANAX ) 0.5 MG tablet Take 1 tablet (0.5 mg total) by mouth 3 (three) times daily as needed for anxiety.   esomeprazole  (NEXIUM ) 40 MG capsule Take 1 capsule (40 mg total) by mouth 2 (two) times daily before a meal.   levothyroxine  (SYNTHROID ) 150 MCG tablet Take 1 tablet (150 mcg total) by mouth daily before breakfast.   No facility-administered encounter medications on file as of 07/06/2023.    Surgical History: Past Surgical History:  Procedure Laterality Date   ANTERIOR CERVICAL DISCECTOMY     APPENDECTOMY     BACK SURGERY     BACK SURGERY  12/04/2020   COLONOSCOPY  09/2013   COLONOSCOPY N/A 05/09/2022   Procedure: COLONOSCOPY;  Surgeon: Quintin Buckle, DO;  Location: [facility] ENDOSCOPY;  Service: Gastroenterology;  Laterality: N/A;   elbow surgery     ESOPHAGOGASTRODUODENOSCOPY (EGD) WITH PROPOFOL  N/A 05/09/2022   Procedure: ESOPHAGOGASTRODUODENOSCOPY (EGD) WITH PROPOFOL ;  Surgeon: Quintin Buckle, DO;  Location: [facility] ENDOSCOPY;  Service: Gastroenterology;  Laterality: N/A;   GIVENS CAPSULE STUDY N/A 05/09/2022   Procedure: GIVENS CAPSULE STUDY;  Surgeon: Quintin Buckle, DO;  Location:  [facility] ENDOSCOPY;  Service: Gastroenterology;  Laterality: N/A;   HEMORRHOID SURGERY     HEMORRHOID SURGERY N/A 05/06/2023   Procedure: HEMORRHOIDECTOMY;  Surgeon: Conrado Delay, DO;  Location: [facility] ORS;  Service: General;  Laterality: N/A;   LAPAROSCOPIC APPENDECTOMY N/A 12/04/2017   Procedure: APPENDECTOMY LAPAROSCOPIC;  Surgeon: Franki Isles, MD;  Location: [facility] ORS;  Service: General;  Laterality: N/A;   LIPOMA EXCISION Right    leg   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     SPINAL CORD STIMULATOR IMPLANT     UPPER GI ENDOSCOPY  09/2013   WRIST SURGERY Left 01/14/2021    Medical History: Past Medical History:  Diagnosis Date   Acute anterolateral wall MI [facility])    a.) age of 42 (approx 50); denies LHC/PCI procedure   Acute appendicitis 12/04/2017   Allergy    takes allergy shots   Anxiety    a.) on BZO (alproazolam) PRN   Aortic atherosclerosis (HCC)    Arthritis    Asthma    Atypical chest pain    Barrett esophagus  Bilateral carotid artery stenosis    BPH (benign prostatic hyperplasia)    Cervicalgia    Chronic pain    CKD (chronic kidney disease) stage 3, GFR 30-59 ml/min (HCC)    Colon polyp    COPD (chronic obstructive pulmonary disease) (HCC)    Coronary artery disease    Depression    Diastolic dysfunction    a.) TTE 06/11/2019: EF >55%, mild LVH, G1DD, mild BAE, mild RVE, triv TR/PR, mild MR   Dieulafoy lesion of colon    Diverticulosis    Former smoker    GERD (gastroesophageal reflux disease)    Hashimoto's thyroiditis    Hemorrhoid    History of kidney stones    Hyperlipidemia    Hypertension    Hypothyroid    IBS (irritable bowel syndrome)    IDA (iron  deficiency anemia)    a.) has required parenteral iron  infusions   Left carpal tunnel syndrome    Male hypogonadism    a.) on exogenous TRT injections (depotestosterone cypionate)   Migraine    Narcolepsy    a.) takes modafinil    Pneumonia    Pre-diabetes    Right rotator cuff  tendonitis    Seizures (HCC)    Spinal stenosis of lumbar region    a.) s/p spinal cord stimulator placement   Status post insertion of spinal cord stimulator     Family History: Family History  Problem Relation Age of Onset   Hypertension Mother    Lung disease Mother    Heart attack Father    Diabetes Father    Liver cancer Sister    Lung cancer Sister    Colon cancer Sister    Uterine cancer Sister     Social History   Socioeconomic History   Marital status: Married    Spouse name: Not on file   Number of children: Not on file   Years of education: Not on file   Highest education level: Not on file  Occupational History   Not on file  Tobacco Use   Smoking status: Former    Current packs/day: 0.00    Average packs/day: 1 pack/day for 30.0 years (30.0 ttl pk-yrs)    Types: Cigarettes    Start date: 02/23/1975    Quit date: 02/22/2005    Years since quitting: 18.4   Smokeless tobacco: Never  Vaping Use   Vaping status: Never Used  Substance and Sexual Activity   Alcohol use: Yes    Alcohol/week: 0.0 standard drinks of alcohol    Comment: occasionally   Drug use: No    Comment: past   Sexual activity: Yes  Other Topics Concern   Not on file  Social History Narrative   Not on file   Social Drivers of Health   Financial Resource Strain: Low Risk  (02/08/2023)   Received from [facility]   Overall Financial Resource Strain (CARDIA)    Difficulty of Paying Living Expenses: Not very hard  Food Insecurity: Food Insecurity Present (02/08/2023)   Received from [facility]   Hunger Vital Sign    Worried About Running Out of Food in the Last Year: Sometimes true    Ran Out of Food in the Last Year: Never true  Transportation Needs: No Transportation Needs (02/08/2023)   Received from [facility] - Transportation    In the past 12 months, has lack of transportation kept you from medical appointments or  from getting  medications?: No    Lack of Transportation (Non-Medical): No  Physical Activity: Not on file  Stress: Not on file  Social Connections: Not on file  Intimate Partner Violence: Not At Risk (05/07/2022)   Humiliation, Afraid, Rape, and Kick questionnaire    Fear of Current or Ex-Partner: No    Emotionally Abused: No    Physically Abused: No    Sexually Abused: No      Review of Systems  Constitutional:  Positive for fatigue.  Respiratory:  Positive for cough (improving). Negative for chest tightness, shortness of breath and wheezing.   Cardiovascular: Negative.  Negative for chest pain and palpitations.  Gastrointestinal: Negative.   Psychiatric/Behavioral:  Positive for sleep disturbance. Negative for self-injury. The patient is nervous/anxious.     Vital Signs: BP 126/78   Pulse 79   Temp 98.6 F (37 C)   Resp 16   Ht 6' (1.829 m)   Wt 221 lb 9.6 oz (100.5 kg)   SpO2 97%   BMI 30.05 kg/m    Physical Exam Vitals reviewed.  Constitutional:      General: He is not in acute distress.    Appearance: Normal appearance. He is not ill-appearing.  HENT:     Head: Normocephalic and atraumatic.   Eyes:     Pupils: Pupils are equal, round, and reactive to light.    Cardiovascular:     Rate and Rhythm: Normal rate and regular rhythm.     Heart sounds: Normal heart sounds. No murmur heard. Pulmonary:     Effort: Pulmonary effort is normal. No respiratory distress.     Breath sounds: Normal breath sounds. No wheezing.   Neurological:     Mental Status: He is alert and oriented to person, place, and time.     Cranial Nerves: No cranial nerve deficit.     Coordination: Coordination normal.     Gait: Gait normal.   Psychiatric:        Mood and Affect: Mood normal.        Behavior: Behavior normal.        Assessment/Plan: 1. Narcolepsy due to underlying condition without cataplexy (Primary) Stop modafinil . Start sunosi  as prescribed. Samples given to  patient. He will start with 75 mg daily for 3-7 days then increase to 150 mg daily.  - Solriamfetol  HCl (SUNOSI ) 150 MG TABS; Take 1 tablet (150 mg total) by mouth daily before breakfast.  Dispense: 30 tablet; Refill: 3  2. Centrilobular emphysema (HCC) Continue breztri  inhaler as prescribed.   3. Acquired hypothyroidism Continue levothyroxine  as prescribed.  - levothyroxine  (SYNTHROID ) 150 MCG tablet; Take 1 tablet (150 mcg total) by mouth daily before breakfast.  Dispense: 90 tablet; Refill: 1  4. Acute hip pain, bilateral Xray of hips ordered  - DG Hip Unilat W OR W/O Pelvis 2-3 Views Right; Future  5. Encounter for medication review Medication list reviewed, updated and refills ordered.  - ALPRAZolam  (XANAX ) 0.5 MG tablet; Take 1 tablet (0.5 mg total) by mouth 3 (three) times daily as needed for anxiety.  Dispense: 90 tablet; Refill: 2 - esomeprazole  (NEXIUM ) 40 MG capsule; Take 1 capsule (40 mg total) by mouth 2 (two) times daily before a meal.  Dispense: 180 capsule; Refill: 1   General Counseling: Randy Mclean verbalizes understanding of the findings of todays visit and agrees with plan of treatment. I have discussed any further diagnostic evaluation that may be needed or ordered today. We also reviewed his medications today. he has been encouraged  to call the office with any questions or concerns that should arise related to todays visit.    Orders Placed This Encounter  Procedures   DG Hip Unilat W OR W/O Pelvis 2-3 Views Right    Meds ordered this encounter  Medications   Solriamfetol  HCl (SUNOSI ) 150 MG TABS    Sig: Take 1 tablet (150 mg total) by mouth daily before breakfast.    Dispense:  30 tablet    Refill:  3    Fill new script today dx code G47.429. discontinue modafinil  now and send prior authorization request asap thanks.   ALPRAZolam  (XANAX ) 0.5 MG tablet    Sig: Take 1 tablet (0.5 mg total) by mouth 3 (three) times daily as needed for anxiety.    Dispense:  90  tablet    Refill:  2   esomeprazole  (NEXIUM ) 40 MG capsule    Sig: Take 1 capsule (40 mg total) by mouth 2 (two) times daily before a meal.    Dispense:  180 capsule    Refill:  1   levothyroxine  (SYNTHROID ) 150 MCG tablet    Sig: Take 1 tablet (150 mcg total) by mouth daily before breakfast.    Dispense:  90 tablet    Refill:  1    For future refills, 90 day supply    Return in about 4 weeks (around 08/03/2023) for F/U, eval new med, Blakelee Allington PCP.   Total time spent:30 Minutes Time spent includes review of chart, medications, test results, and follow up plan with the patient.   [place] Controlled Substance Database was reviewed by me.  This patient was seen by Laurence Pons, FNP-C in collaboration with Dr. Verneta Gone as a part of collaborative care agreement.   Kinze Labo R. Bobbi Burow, MSN, FNP-C Internal medicine

## 2023-07-07 ENCOUNTER — Other Ambulatory Visit: Payer: Self-pay

## 2023-07-07 ENCOUNTER — Other Ambulatory Visit: Payer: Self-pay | Admitting: Nurse Practitioner

## 2023-07-07 DIAGNOSIS — D509 Iron deficiency anemia, unspecified: Secondary | ICD-10-CM

## 2023-07-07 DIAGNOSIS — R053 Chronic cough: Secondary | ICD-10-CM

## 2023-07-08 ENCOUNTER — Inpatient Hospital Stay: Payer: No Typology Code available for payment source | Attending: Oncology

## 2023-07-08 DIAGNOSIS — Z8249 Family history of ischemic heart disease and other diseases of the circulatory system: Secondary | ICD-10-CM | POA: Insufficient documentation

## 2023-07-08 DIAGNOSIS — N183 Chronic kidney disease, stage 3 unspecified: Secondary | ICD-10-CM | POA: Diagnosis not present

## 2023-07-08 DIAGNOSIS — Z87891 Personal history of nicotine dependence: Secondary | ICD-10-CM | POA: Insufficient documentation

## 2023-07-08 DIAGNOSIS — N400 Enlarged prostate without lower urinary tract symptoms: Secondary | ICD-10-CM | POA: Diagnosis not present

## 2023-07-08 DIAGNOSIS — Z87442 Personal history of urinary calculi: Secondary | ICD-10-CM | POA: Insufficient documentation

## 2023-07-08 DIAGNOSIS — Z880 Allergy status to penicillin: Secondary | ICD-10-CM | POA: Insufficient documentation

## 2023-07-08 DIAGNOSIS — I252 Old myocardial infarction: Secondary | ICD-10-CM | POA: Insufficient documentation

## 2023-07-08 DIAGNOSIS — D509 Iron deficiency anemia, unspecified: Secondary | ICD-10-CM | POA: Insufficient documentation

## 2023-07-08 DIAGNOSIS — E785 Hyperlipidemia, unspecified: Secondary | ICD-10-CM | POA: Diagnosis not present

## 2023-07-08 DIAGNOSIS — Z833 Family history of diabetes mellitus: Secondary | ICD-10-CM | POA: Insufficient documentation

## 2023-07-08 DIAGNOSIS — Z860100 Personal history of colon polyps, unspecified: Secondary | ICD-10-CM | POA: Insufficient documentation

## 2023-07-08 DIAGNOSIS — Z79890 Hormone replacement therapy (postmenopausal): Secondary | ICD-10-CM | POA: Insufficient documentation

## 2023-07-08 DIAGNOSIS — Z8719 Personal history of other diseases of the digestive system: Secondary | ICD-10-CM | POA: Insufficient documentation

## 2023-07-08 DIAGNOSIS — Z9049 Acquired absence of other specified parts of digestive tract: Secondary | ICD-10-CM | POA: Diagnosis not present

## 2023-07-08 DIAGNOSIS — Z825 Family history of asthma and other chronic lower respiratory diseases: Secondary | ICD-10-CM | POA: Insufficient documentation

## 2023-07-08 DIAGNOSIS — Z800 Family history of malignant neoplasm of digestive organs: Secondary | ICD-10-CM | POA: Diagnosis not present

## 2023-07-08 DIAGNOSIS — Z79899 Other long term (current) drug therapy: Secondary | ICD-10-CM | POA: Diagnosis not present

## 2023-07-08 DIAGNOSIS — Z8049 Family history of malignant neoplasm of other genital organs: Secondary | ICD-10-CM | POA: Diagnosis not present

## 2023-07-08 DIAGNOSIS — Z888 Allergy status to other drugs, medicaments and biological substances status: Secondary | ICD-10-CM | POA: Diagnosis not present

## 2023-07-08 DIAGNOSIS — Z801 Family history of malignant neoplasm of trachea, bronchus and lung: Secondary | ICD-10-CM | POA: Insufficient documentation

## 2023-07-08 DIAGNOSIS — I129 Hypertensive chronic kidney disease with stage 1 through stage 4 chronic kidney disease, or unspecified chronic kidney disease: Secondary | ICD-10-CM | POA: Insufficient documentation

## 2023-07-08 DIAGNOSIS — I2510 Atherosclerotic heart disease of native coronary artery without angina pectoris: Secondary | ICD-10-CM | POA: Diagnosis not present

## 2023-07-08 DIAGNOSIS — G47419 Narcolepsy without cataplexy: Secondary | ICD-10-CM | POA: Insufficient documentation

## 2023-07-08 LAB — CBC WITH DIFFERENTIAL (CANCER CENTER ONLY)
Abs Immature Granulocytes: 0.03 10*3/uL (ref 0.00–0.07)
Basophils Absolute: 0.1 10*3/uL (ref 0.0–0.1)
Basophils Relative: 1 %
Eosinophils Absolute: 0.1 10*3/uL (ref 0.0–0.5)
Eosinophils Relative: 1 %
HCT: 39.1 % (ref 39.0–52.0)
Hemoglobin: 11.9 g/dL — ABNORMAL LOW (ref 13.0–17.0)
Immature Granulocytes: 0 %
Lymphocytes Relative: 19 %
Lymphs Abs: 1.4 10*3/uL (ref 0.7–4.0)
MCH: 23 pg — ABNORMAL LOW (ref 26.0–34.0)
MCHC: 30.4 g/dL (ref 30.0–36.0)
MCV: 75.5 fL — ABNORMAL LOW (ref 80.0–100.0)
Monocytes Absolute: 0.8 10*3/uL (ref 0.1–1.0)
Monocytes Relative: 10 %
Neutro Abs: 5.1 10*3/uL (ref 1.7–7.7)
Neutrophils Relative %: 69 %
Platelet Count: 237 10*3/uL (ref 150–400)
RBC: 5.18 MIL/uL (ref 4.22–5.81)
RDW: 17.5 % — ABNORMAL HIGH (ref 11.5–15.5)
WBC Count: 7.5 10*3/uL (ref 4.0–10.5)
nRBC: 0 % (ref 0.0–0.2)

## 2023-07-08 LAB — IRON AND TIBC
Iron: 88 ug/dL (ref 45–182)
Saturation Ratios: 20 % (ref 17.9–39.5)
TIBC: 448 ug/dL (ref 250–450)
UIBC: 360 ug/dL

## 2023-07-08 LAB — FERRITIN: Ferritin: 7 ng/mL — ABNORMAL LOW (ref 24–336)

## 2023-07-08 LAB — SAMPLE TO BLOOD BANK

## 2023-07-12 ENCOUNTER — Encounter: Payer: Self-pay | Admitting: Oncology

## 2023-07-12 ENCOUNTER — Inpatient Hospital Stay: Payer: No Typology Code available for payment source

## 2023-07-12 ENCOUNTER — Inpatient Hospital Stay (HOSPITAL_BASED_OUTPATIENT_CLINIC_OR_DEPARTMENT_OTHER): Payer: No Typology Code available for payment source | Admitting: Oncology

## 2023-07-12 VITALS — BP 142/77 | HR 72 | Resp 16

## 2023-07-12 VITALS — BP 155/75 | HR 74 | Temp 97.8°F | Resp 16 | Ht 72.0 in | Wt 223.0 lb

## 2023-07-12 DIAGNOSIS — D509 Iron deficiency anemia, unspecified: Secondary | ICD-10-CM

## 2023-07-12 DIAGNOSIS — D508 Other iron deficiency anemias: Secondary | ICD-10-CM

## 2023-07-12 MED ORDER — IRON SUCROSE 20 MG/ML IV SOLN
200.0000 mg | Freq: Once | INTRAVENOUS | Status: AC
  Administered 2023-07-12: 200 mg via INTRAVENOUS
  Filled 2023-07-12: qty 10

## 2023-07-12 NOTE — Progress Notes (Signed)
 [facility]  [phone] [phone]  ID: Randy Mclean Younger OB: October 18, 1960  MR#: [id number]  QIO#:[id number]  Patient Care Team: Laurence Pons, NP as PCP - General (Nurse Practitioner) Lawton Price, MD (Internal Medicine) Shellie Dials, MD as Consulting Physician (Oncology)   CHIEF COMPLAINT: Iron  deficiency anemia.  INTERVAL HISTORY: Patient returns to clinic today for repeat laboratory, further evaluation, and continuation of IV Venofer .  He does not complain of any weakness or fatigue today.  He denies any further dizziness.  He currently feels well.  He has no neurologic complaints.  He denies any recent fevers or illnesses.  He has no chest pain, shortness of breath, cough, or hemoptysis.  He denies any nausea, vomiting, constipation, or diarrhea.  He has no melena or hematochezia.  He has no urinary complaints.  Patient offers no specific complaints today.  REVIEW OF SYSTEMS:   Review of Systems  Constitutional: Negative.  Negative for fever, malaise/fatigue and weight loss.  Respiratory: Negative.  Negative for cough and shortness of breath.   Cardiovascular: Negative.  Negative for chest pain and leg swelling.  Gastrointestinal: Negative.  Negative for abdominal pain, blood in stool and melena.  Genitourinary: Negative.  Negative for hematuria.  Musculoskeletal: Negative.  Negative for back pain and neck pain.  Skin: Negative.  Negative for rash.  Neurological: Negative.  Negative for dizziness, sensory change, focal weakness, weakness and headaches.  Psychiatric/Behavioral: Negative.  The patient is not nervous/anxious.     As per HPI. Otherwise, a complete review of systems is negative.  PAST MEDICAL HISTORY: Past Medical History:  Diagnosis Date   Acute anterolateral wall MI [facility])    a.) age of 29 (approx 89); denies LHC/PCI procedure   Acute appendicitis 12/04/2017   Allergy    takes allergy shots   Anxiety     a.) on BZO (alproazolam) PRN   Aortic atherosclerosis (HCC)    Arthritis    Asthma    Atypical chest pain    Barrett esophagus    Bilateral carotid artery stenosis    BPH (benign prostatic hyperplasia)    Cervicalgia    Chronic pain    CKD (chronic kidney disease) stage 3, GFR 30-59 ml/min (HCC)    Colon polyp    COPD (chronic obstructive pulmonary disease) (HCC)    Coronary artery disease    Depression    Diastolic dysfunction    a.) TTE 06/11/2019: EF >55%, mild LVH, G1DD, mild BAE, mild RVE, triv TR/PR, mild MR   Dieulafoy lesion of colon    Diverticulosis    Former smoker    GERD (gastroesophageal reflux disease)    Hashimoto's thyroiditis    Hemorrhoid    History of kidney stones    Hyperlipidemia    Hypertension    Hypothyroid    IBS (irritable bowel syndrome)    IDA (iron  deficiency anemia)    a.) has required parenteral iron  infusions   Left carpal tunnel syndrome    Male hypogonadism    a.) on exogenous TRT injections (depotestosterone cypionate)   Migraine    Narcolepsy    a.) takes modafinil    Pneumonia    Pre-diabetes    Right rotator cuff tendonitis    Seizures (HCC)    Spinal stenosis of lumbar region    a.) s/p spinal cord stimulator placement   Status post insertion of spinal cord stimulator     PAST SURGICAL HISTORY: Past Surgical History:  Procedure Laterality Date  ANTERIOR CERVICAL DISCECTOMY     APPENDECTOMY     BACK SURGERY     BACK SURGERY  12/04/2020   COLONOSCOPY  09/2013   COLONOSCOPY N/A 05/09/2022   Procedure: COLONOSCOPY;  Surgeon: Quintin Buckle, DO;  Location: [facility] ENDOSCOPY;  Service: Gastroenterology;  Laterality: N/A;   elbow surgery     ESOPHAGOGASTRODUODENOSCOPY (EGD) WITH PROPOFOL  N/A 05/09/2022   Procedure: ESOPHAGOGASTRODUODENOSCOPY (EGD) WITH PROPOFOL ;  Surgeon: Quintin Buckle, DO;  Location: [facility] ENDOSCOPY;  Service: Gastroenterology;  Laterality: N/A;   GIVENS CAPSULE STUDY N/A 05/09/2022   Procedure:  GIVENS CAPSULE STUDY;  Surgeon: Quintin Buckle, DO;  Location: [facility] ENDOSCOPY;  Service: Gastroenterology;  Laterality: N/A;   HEMORRHOID SURGERY     HEMORRHOID SURGERY N/A 05/06/2023   Procedure: HEMORRHOIDECTOMY;  Surgeon: Conrado Delay, DO;  Location: [facility] ORS;  Service: General;  Laterality: N/A;   LAPAROSCOPIC APPENDECTOMY N/A 12/04/2017   Procedure: APPENDECTOMY LAPAROSCOPIC;  Surgeon: Franki Isles, MD;  Location: [facility] ORS;  Service: General;  Laterality: N/A;   LIPOMA EXCISION Right    leg   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     SPINAL CORD STIMULATOR IMPLANT     UPPER GI ENDOSCOPY  09/2013   WRIST SURGERY Left 01/14/2021    FAMILY HISTORY Family History  Problem Relation Age of Onset   Hypertension Mother    Lung disease Mother    Heart attack Father    Diabetes Father    Liver cancer Sister    Lung cancer Sister    Colon cancer Sister    Uterine cancer Sister        ADVANCED DIRECTIVES:    HEALTH MAINTENANCE: Social History   Tobacco Use   Smoking status: Former    Current packs/day: 0.00    Average packs/day: 1 pack/day for 30.0 years (30.0 ttl pk-yrs)    Types: Cigarettes    Start date: 02/23/1975    Quit date: 02/22/2005    Years since quitting: 18.3   Smokeless tobacco: Never  Vaping Use   Vaping status: Never Used  Substance Use Topics   Alcohol use: Yes    Alcohol/week: 0.0 standard drinks of alcohol    Comment: occasionally   Drug use: No    Comment: past     Colonoscopy:  PAP:  Bone density:  Lipid panel:  Allergies  Allergen Reactions   Amoxicillin Anaphylaxis    REACTION: unspecified   Gabapentin Anaphylaxis   Penicillin G Anaphylaxis    Current Outpatient Medications  Medication Sig Dispense Refill   alfuzosin  (UROXATRAL ) 10 MG 24 hr tablet TAKE 1 TABLET (10 MG TOTAL) BY MOUTH IN THE MORNING AND AT BEDTIME 180 tablet 1   ALPRAZolam  (XANAX ) 0.5 MG tablet Take 1 tablet (0.5 mg total) by mouth 3 (three) times daily  as needed for anxiety. 90 tablet 2   amLODipine  (NORVASC ) 10 MG tablet Take 1 tablet (10 mg total) by mouth daily. (Patient taking differently: Take 10 mg by mouth every morning.) 90 tablet 1   atorvastatin  (LIPITOR) 10 MG tablet Take 1 tablet (10 mg total) by mouth daily. (Patient taking differently: Take 10 mg by mouth at bedtime.) 90 tablet 1   benzonatate  (TESSALON ) 200 MG capsule TAKE 1 CAPSULE BY MOUTH 2 TIMES DAILY AS NEEDED FOR COUGH. 30 capsule 3   Budeson-Glycopyrrol-Formoterol  (BREZTRI  AEROSPHERE) 160-9-4.8 MCG/ACT AERO Inhale 2 puffs into the lungs in the morning and at bedtime. 10.7 g 5   buPROPion  (WELLBUTRIN  XL) 300 MG  24 hr tablet TAKE 1 TABLET BY MOUTH DAILY, GENERIC EQUIVALENT FOR WELLBUTRIN  XL. DUE FOR OFFICE VISIT. (Patient taking differently: 300 mg every morning. TAKE 1 TABLET BY MOUTH DAILY, GENERIC EQUIVALENT FOR WELLBUTRIN  XL. DUE FOR OFFICE VISIT.) 90 tablet 1   esomeprazole  (NEXIUM ) 40 MG capsule Take 1 capsule (40 mg total) by mouth 2 (two) times daily before a meal. 180 capsule 1   fexofenadine (ALLEGRA) 180 MG tablet Take 180 mg by mouth 2 (two) times daily.     FIBER GUMMIES PO Take 2 tablets by mouth daily.     hydrochlorothiazide  (HYDRODIURIL ) 12.5 MG tablet Take 1 tablet (12.5 mg total) by mouth daily. (Patient taking differently: Take 12.5 mg by mouth every morning.) 90 tablet 1   ipratropium-albuterol  (DUONEB) 0.5-2.5 (3) MG/3ML SOLN Take 3 mLs by nebulization every 6 (six) hours as needed (SOB/wheezing/cough). 360 mL 3   levothyroxine  (SYNTHROID ) 150 MCG tablet Take 1 tablet (150 mcg total) by mouth daily before breakfast. 90 tablet 1   methocarbamol  (ROBAXIN ) 750 MG tablet Take 750 mg by mouth 4 (four) times daily as needed for muscle spasms.     metoprolol  succinate (TOPROL -XL) 25 MG 24 hr tablet Take 25 mg by mouth at bedtime.     Multiple Vitamin (MULTI-VITAMINS) TABS Take 1 tablet by mouth daily.     pregabalin  (LYRICA ) 100 MG capsule Take 1 capsule (100 mg  total) by mouth every 8 (eight) hours. 10 capsule 0   senna (SENOKOT) 8.6 MG TABS tablet Take 1 tablet by mouth at bedtime.     Solriamfetol  HCl (SUNOSI ) 150 MG TABS Take 1 tablet (150 mg total) by mouth daily before breakfast. 30 tablet 3   SYRINGE-NEEDLE, DISP, 3 ML 22G X 1" 3 ML MISC Use as directed.     testosterone  cypionate (DEPOTESTOSTERONE CYPIONATE) 200 MG/ML injection Inject 0.7 mLs (140 mg total) into the muscle once a week. 10 mL 0   tiZANidine  (ZANAFLEX ) 4 MG tablet Take 4 mg by mouth at bedtime.     valsartan  (DIOVAN ) 80 MG tablet Take 80 mg by mouth at bedtime.     No current facility-administered medications for this visit.    OBJECTIVE: Vitals:   07/12/23 1403  BP: (!) 155/75  Pulse: 74  Resp: 16  Temp: 97.8 F (36.6 C)  SpO2: 97%       Body mass index is 30.24 kg/m.    ECOG FS:0 - Asymptomatic  General: Well-developed, well-nourished, no acute distress. Eyes: Pink conjunctiva, anicteric sclera. HEENT: Normocephalic, moist mucous membranes. Lungs: No audible wheezing or coughing. Heart: Regular rate and rhythm. Abdomen: Soft, nontender, no obvious distention. Musculoskeletal: No edema, cyanosis, or clubbing. Neuro: Alert, answering all questions appropriately. Cranial nerves grossly intact. Skin: No rashes or petechiae noted. Psych: Normal affect.  LAB RESULTS:  Lab Results  Component Value Date   NA 138 05/03/2023   K 3.4 (L) 05/03/2023   CL 103 05/03/2023   CO2 28 05/03/2023   GLUCOSE 75 05/03/2023   BUN 13 05/03/2023   CREATININE 1.16 05/03/2023   CALCIUM  8.4 (L) 05/03/2023   PROT 7.0 10/18/2022   ALBUMIN 4.1 10/18/2022   AST 18 10/18/2022   ALT 20 10/18/2022   ALKPHOS 64 10/18/2022   BILITOT 0.4 10/18/2022   GFRNONAA >60 05/03/2023   GFRAA >60 05/05/2019    Lab Results  Component Value Date   WBC 7.5 07/08/2023   NEUTROABS 5.1 07/08/2023   HGB 11.9 (L) 07/08/2023   HCT 39.1 07/08/2023  MCV 75.5 (L) 07/08/2023   PLT 237 07/08/2023    Lab Results  Component Value Date   IRON  88 07/08/2023   TIBC 448 07/08/2023   IRONPCTSAT 20 07/08/2023   Lab Results  Component Value Date   FERRITIN 7 (L) 07/08/2023     STUDIES: No results found.  ASSESSMENT: Iron  deficiency anemia.  PLAN:    Iron  deficiency anemia: EGD and colonoscopy on May 09, 2022 did not reveal any significant pathology.  Patient also had capsule endoscopy, but these results are not available at this time.  Patient underwent hemorrhoidectomy in March 2025.  Patient's hemoglobin has improved, but remains mildly decreased at 11.9.  He also continues to have a mildly decreased ferritin.  Proceed with 200 mg IV Venofer  today.  Return to clinic 3 times over the next 1 to 2 weeks for additional treatments.  Patient will then return to clinic in 4 months with repeat laboratory work, further evaluation, and continuation of treatment if needed.   GI bleed: Hemorrhoidectomy as above. Family history of colon cancer:  Patient gets routine colonoscopies given his family history of colon cancer, all of which been unrevealing.  His genetic testing is negative. Dizziness/weakness/fatigue: Resolved. PSA: Previously increased, but now within normal limits at 1.7. Hypertension: Patient's blood pressure moderately elevated today.  Continue monitoring treatment per primary care.    Patient expressed understanding and was in agreement with this plan. He also understands that He can call clinic at any time with any questions, concerns, or complaints.    Shellie Dials, MD   07/12/2023 2:36 PM

## 2023-07-12 NOTE — Patient Instructions (Signed)

## 2023-07-15 ENCOUNTER — Inpatient Hospital Stay

## 2023-07-15 VITALS — BP 142/74 | HR 72 | Temp 97.3°F | Resp 18

## 2023-07-15 DIAGNOSIS — D508 Other iron deficiency anemias: Secondary | ICD-10-CM

## 2023-07-15 DIAGNOSIS — D509 Iron deficiency anemia, unspecified: Secondary | ICD-10-CM | POA: Diagnosis not present

## 2023-07-15 MED ORDER — IRON SUCROSE 20 MG/ML IV SOLN
200.0000 mg | Freq: Once | INTRAVENOUS | Status: AC
  Administered 2023-07-15: 200 mg via INTRAVENOUS

## 2023-07-15 NOTE — Patient Instructions (Signed)

## 2023-07-20 ENCOUNTER — Inpatient Hospital Stay

## 2023-07-20 VITALS — BP 124/80 | HR 74 | Temp 97.6°F | Resp 18

## 2023-07-20 DIAGNOSIS — D508 Other iron deficiency anemias: Secondary | ICD-10-CM

## 2023-07-20 DIAGNOSIS — D509 Iron deficiency anemia, unspecified: Secondary | ICD-10-CM | POA: Diagnosis not present

## 2023-07-20 MED ORDER — IRON SUCROSE 20 MG/ML IV SOLN
200.0000 mg | Freq: Once | INTRAVENOUS | Status: AC
  Administered 2023-07-20: 200 mg via INTRAVENOUS

## 2023-07-20 NOTE — Patient Instructions (Signed)

## 2023-07-22 ENCOUNTER — Inpatient Hospital Stay

## 2023-07-24 ENCOUNTER — Other Ambulatory Visit: Payer: Self-pay | Admitting: Nurse Practitioner

## 2023-07-24 DIAGNOSIS — Z79899 Other long term (current) drug therapy: Secondary | ICD-10-CM

## 2023-07-25 ENCOUNTER — Inpatient Hospital Stay: Attending: Oncology

## 2023-07-25 ENCOUNTER — Other Ambulatory Visit: Payer: Self-pay | Admitting: Nurse Practitioner

## 2023-07-25 ENCOUNTER — Ambulatory Visit
Admission: RE | Admit: 2023-07-25 | Discharge: 2023-07-25 | Disposition: A | Discharge status: Home / Self Care | Source: Ambulatory Visit | Attending: Nurse Practitioner | Admitting: Nurse Practitioner

## 2023-07-25 VITALS — BP 131/77 | HR 78 | Temp 98.6°F | Resp 18

## 2023-07-25 DIAGNOSIS — E039 Hypothyroidism, unspecified: Secondary | ICD-10-CM

## 2023-07-25 DIAGNOSIS — M25551 Pain in right hip: Secondary | ICD-10-CM

## 2023-07-25 DIAGNOSIS — G47429 Narcolepsy in conditions classified elsewhere without cataplexy: Secondary | ICD-10-CM

## 2023-07-25 DIAGNOSIS — D509 Iron deficiency anemia, unspecified: Secondary | ICD-10-CM | POA: Insufficient documentation

## 2023-07-25 DIAGNOSIS — Z79899 Other long term (current) drug therapy: Secondary | ICD-10-CM

## 2023-07-25 DIAGNOSIS — D508 Other iron deficiency anemias: Secondary | ICD-10-CM

## 2023-07-25 MED ORDER — SODIUM CHLORIDE 0.9% FLUSH
10.0000 mL | Freq: Once | INTRAVENOUS | Status: AC | PRN
  Administered 2023-07-25: 10 mL
  Filled 2023-07-25: qty 10

## 2023-07-25 MED ORDER — IRON SUCROSE 20 MG/ML IV SOLN
200.0000 mg | Freq: Once | INTRAVENOUS | Status: AC
  Administered 2023-07-25: 200 mg via INTRAVENOUS
  Filled 2023-07-25: qty 10

## 2023-08-04 ENCOUNTER — Encounter: Payer: Self-pay | Admitting: Nurse Practitioner

## 2023-08-05 ENCOUNTER — Telehealth: Payer: Self-pay

## 2023-08-08 MED ORDER — PREDNISONE 10 MG (21) PO TBPK: ORAL_TABLET | ORAL | 0 refills | Status: DC

## 2023-08-08 MED ORDER — DOXYCYCLINE HYCLATE 100 MG PO CAPS: 100.0000 mg | ORAL_CAPSULE | Freq: Two times a day (BID) | ORAL | 0 refills | Status: AC

## 2023-08-09 MED ORDER — HYDROCOD POLI-CHLORPHE POLI ER 10-8 MG/5ML PO SUER: 5.0000 mL | Freq: Two times a day (BID) | ORAL | 0 refills | Status: DC | PRN

## 2023-08-09 NOTE — Telephone Encounter (Signed)
 Patient notified

## 2023-08-10 ENCOUNTER — Telehealth (INDEPENDENT_AMBULATORY_CARE_PROVIDER_SITE_OTHER): Admitting: Nurse Practitioner

## 2023-08-10 ENCOUNTER — Encounter: Payer: Self-pay | Admitting: Nurse Practitioner

## 2023-08-10 VITALS — Ht 72.0 in | Wt 214.0 lb

## 2023-08-10 DIAGNOSIS — J189 Pneumonia, unspecified organism: Secondary | ICD-10-CM | POA: Diagnosis not present

## 2023-08-10 DIAGNOSIS — G47429 Narcolepsy in conditions classified elsewhere without cataplexy: Secondary | ICD-10-CM

## 2023-08-10 DIAGNOSIS — J441 Chronic obstructive pulmonary disease with (acute) exacerbation: Secondary | ICD-10-CM | POA: Diagnosis not present

## 2023-08-10 MED ORDER — LEVOFLOXACIN 750 MG PO TABS: 750.0000 mg | ORAL_TABLET | Freq: Every day | ORAL | 0 refills | Status: AC

## 2023-08-10 MED ORDER — MODAFINIL 200 MG PO TABS: 200.0000 mg | ORAL_TABLET | Freq: Every day | ORAL | 2 refills | Status: DC

## 2023-08-10 MED ORDER — PREDNISONE 10 MG PO TABS
ORAL_TABLET | ORAL | 0 refills | Status: DC
Start: 2023-08-10 — End: 2023-11-29

## 2023-08-10 NOTE — Progress Notes (Signed)
 [facility] [address] [place], [place] [address]  Internal MEDICINE  Telephone Visit  Patient Name: Randy Mclean  [id number]  [id number]  Date of Service: 08/10/2023  I connected with the patient at 1615 by telephone and verified the patients identity using two identifiers.   I discussed the limitations, risks, security and privacy concerns of performing an evaluation and management service by telephone and the availability of in person appointments. I also discussed with the patient that there may be a patient responsible charge related to the service.  The patient expressed understanding and agrees to proceed.    Chief Complaint  Patient presents with   Telephone Screen   Telephone Assessment    Medication follow up    Cough    No smell and no taste    Fever    101,102 temp  covid test is negative  and otc is not helping    Sinusitis    HPI Randy Mclean presents for a telehealth virtual visit for respiratory infection Patient initially called on 08/05/23 with deep cough, chest congestion, fever, runny nose and decreased appetite.  He was given doxycycline  and prednisone  as well as some cough medication.  Symptoms have not significantly improved, cough remains severe, cannot smell or taste anything, still having a fever as well.  Covid test was negative, OTC medications are not helping.  Sunosi  is not helping, wants to go back to modafinil .   Current Medication: Outpatient Encounter Medications as of 08/10/2023  Medication Sig   alfuzosin  (UROXATRAL ) 10 MG 24 hr tablet TAKE 1 TABLET (10 MG TOTAL) BY MOUTH IN THE MORNING AND AT BEDTIME   ALPRAZolam  (XANAX ) 0.5 MG tablet Take 1 tablet (0.5 mg total) by mouth 3 (three) times daily as needed for anxiety.   amLODipine  (NORVASC ) 10 MG tablet Take 1 tablet (10 mg total) by mouth daily. (Patient taking differently: Take 10 mg by mouth every morning.)   atorvastatin  (LIPITOR) 10 MG tablet Take 1 tablet (10 mg total) by  mouth daily. (Patient taking differently: Take 10 mg by mouth at bedtime.)   benzonatate  (TESSALON ) 200 MG capsule TAKE 1 CAPSULE BY MOUTH 2 TIMES DAILY AS NEEDED FOR COUGH.   Budeson-Glycopyrrol-Formoterol  (BREZTRI  AEROSPHERE) 160-9-4.8 MCG/ACT AERO Inhale 2 puffs into the lungs in the morning and at bedtime.   buPROPion  (WELLBUTRIN  XL) 300 MG 24 hr tablet TAKE 1 TABLET BY MOUTH DAILY, GENERIC EQUIVALENT FOR WELLBUTRIN  XL. DUE FOR OFFICE VISIT. (Patient taking differently: 300 mg every morning. TAKE 1 TABLET BY MOUTH DAILY, GENERIC EQUIVALENT FOR WELLBUTRIN  XL. DUE FOR OFFICE VISIT.)   chlorpheniramine-HYDROcodone (TUSSIONEX) 10-8 MG/5ML Take 5 mLs by mouth every 12 (twelve) hours as needed.   doxycycline  (VIBRAMYCIN ) 100 MG capsule Take 1 capsule (100 mg total) by mouth 2 (two) times daily for 10 days.   esomeprazole  (NEXIUM ) 40 MG capsule Take 1 capsule (40 mg total) by mouth 2 (two) times daily before a meal.   fexofenadine (ALLEGRA) 180 MG tablet Take 180 mg by mouth 2 (two) times daily.   FIBER GUMMIES PO Take 2 tablets by mouth daily.   hydrochlorothiazide  (HYDRODIURIL ) 12.5 MG tablet Take 1 tablet (12.5 mg total) by mouth daily. (Patient taking differently: Take 12.5 mg by mouth every morning.)   ipratropium-albuterol  (DUONEB) 0.5-2.5 (3) MG/3ML SOLN Take 3 mLs by nebulization every 6 (six) hours as needed (SOB/wheezing/cough).   levofloxacin  (LEVAQUIN ) 750 MG tablet Take 1 tablet (750 mg total) by mouth daily for 7 days. Take with  food   levothyroxine  (SYNTHROID ) 150 MCG tablet Take 1 tablet (150 mcg total) by mouth daily before breakfast.   methocarbamol  (ROBAXIN ) 750 MG tablet Take 750 mg by mouth 4 (four) times daily as needed for muscle spasms.   metoprolol  succinate (TOPROL -XL) 25 MG 24 hr tablet Take 25 mg by mouth at bedtime.   Multiple Vitamin (MULTI-VITAMINS) TABS Take 1 tablet by mouth daily.   predniSONE  (DELTASONE ) 10 MG tablet Take one tab 3 x day for 3 days, then take one tab 2  x a day for 3 days and then take one tab a day for 3 days for copd   pregabalin  (LYRICA ) 100 MG capsule Take 1 capsule (100 mg total) by mouth every 8 (eight) hours.   senna (SENOKOT) 8.6 MG TABS tablet Take 1 tablet by mouth at bedtime.   SYRINGE-NEEDLE, DISP, 3 ML 22G X 1 3 ML MISC Use as directed.   testosterone  cypionate (DEPOTESTOSTERONE CYPIONATE) 200 MG/ML injection Inject 0.7 mLs (140 mg total) into the muscle once a week.   tiZANidine  (ZANAFLEX ) 4 MG tablet Take 4 mg by mouth at bedtime.   valsartan  (DIOVAN ) 80 MG tablet Take 80 mg by mouth at bedtime.   [DISCONTINUED] predniSONE  (STERAPRED UNI-PAK 21 TAB) 10 MG (21) TBPK tablet Use as directed for 6 days   [DISCONTINUED] Solriamfetol  HCl (SUNOSI ) 150 MG TABS Take 1 tablet (150 mg total) by mouth daily before breakfast.   modafinil  (PROVIGIL ) 200 MG tablet Take 1 tablet (200 mg total) by mouth daily.   No facility-administered encounter medications on file as of 08/10/2023.    Surgical History: Past Surgical History:  Procedure Laterality Date   ANTERIOR CERVICAL DISCECTOMY     APPENDECTOMY     BACK SURGERY     BACK SURGERY  12/04/2020   COLONOSCOPY  09/2013   COLONOSCOPY N/A 05/09/2022   Procedure: COLONOSCOPY;  Surgeon: Quintin Buckle, DO;  Location: [facility] ENDOSCOPY;  Service: Gastroenterology;  Laterality: N/A;   elbow surgery     ESOPHAGOGASTRODUODENOSCOPY (EGD) WITH PROPOFOL  N/A 05/09/2022   Procedure: ESOPHAGOGASTRODUODENOSCOPY (EGD) WITH PROPOFOL ;  Surgeon: Quintin Buckle, DO;  Location: [facility] ENDOSCOPY;  Service: Gastroenterology;  Laterality: N/A;   GIVENS CAPSULE STUDY N/A 05/09/2022   Procedure: GIVENS CAPSULE STUDY;  Surgeon: Quintin Buckle, DO;  Location[facility] END[facility]COPY;  Service: Gastroenterology;  Laterality: N/A;   HEMORRHOID SURGERY     HEMORRHOID SURGERY N/A 05/06/2023   Procedure: HEMORRHOIDECTOMY;  Surgeon: Conrado Delay, DO;  Location: [facility] ORS;  Service: General;  Laterality: N/A;    LAPAROSCOPIC APPENDECTOMY N/A 12/04/2017   Procedure: APPENDECTOMY LAPAROSCOPIC;  Surgeon: Franki Isles, MD;  Location: [facility] ORS;  Service: General;  Laterality: N/A;   LIPOMA EXCISION Right    leg   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     SPINAL CORD STIMULATOR IMPLANT     UPPER GI ENDOSCOPY  09/2013   WRIST SURGERY Left 01/14/2021    Medical History: Past Medical History:  Diagnosis Date   Acute anterolateral wall MI [facility])    a.) age of 68 (approx 54); denies LHC/PCI procedure   Acute appendicitis 12/04/2017   Allergy    takes allergy shots   Anxiety    a.) on BZO (alproazolam) PRN   Aortic atherosclerosis (HCC)    Arthritis    Asthma    Atypical chest pain    Barrett esophagus    Bilateral carotid artery stenosis    BPH (benign prostatic hyperplasia)  Cervicalgia    Chronic pain    CKD (chronic kidney disease) stage 3, GFR 30-59 ml/min (HCC)    Colon polyp    COPD (chronic obstructive pulmonary disease) (HCC)    Coronary artery disease    Depression    Diastolic dysfunction    a.) TTE 06/11/2019: EF >55%, mild LVH, G1DD, mild BAE, mild RVE, triv TR/PR, mild MR   Dieulafoy lesion of colon    Diverticulosis    Former smoker    GERD (gastroesophageal reflux disease)    Hashimoto's thyroiditis    Hemorrhoid    History of kidney stones    Hyperlipidemia    Hypertension    Hypothyroid    IBS (irritable bowel syndrome)    IDA (iron  deficiency anemia)    a.) has required parenteral iron  infusions   Left carpal tunnel syndrome    Male hypogonadism    a.) on exogenous TRT injections (depotestosterone cypionate)   Migraine    Narcolepsy    a.) takes modafinil    Pneumonia    Pre-diabetes    Right rotator cuff tendonitis    Seizures (HCC)    Spinal stenosis of lumbar region    a.) s/p spinal cord stimulator placement   Status post insertion of spinal cord stimulator     Family History: Family History  Problem Relation Age of Onset    Hypertension Mother    Lung disease Mother    Heart attack Father    Diabetes Father    Liver cancer Sister    Lung cancer Sister    Colon cancer Sister    Uterine cancer Sister     Social History   Socioeconomic History   Marital status: Married    Spouse name: Not on file   Number of children: Not on file   Years of education: Not on file   Highest education level: Not on file  Occupational History   Not on file  Tobacco Use   Smoking status: Former    Current packs/day: 0.00    Average packs/day: 1 pack/day for 30.0 years (30.0 ttl pk-yrs)    Types: Cigarettes    Start date: 02/23/1975    Quit date: 02/22/2005    Years since quitting: 18.4   Smokeless tobacco: Never  Vaping Use   Vaping status: Never Used  Substance and Sexual Activity   Alcohol use: Yes    Alcohol/week: 0.0 standard drinks of alcohol    Comment: occasionally   Drug use: No    Comment: past   Sexual activity: Yes  Other Topics Concern   Not on file  Social History Narrative   Not on file   Social Drivers of Health   Financial Resource Strain: Low Risk  (02/08/2023)   Received from [facility]   Overall Financial Resource Strain (CARDIA)    Difficulty of Paying Living Expenses: Not very hard  Food Insecurity: Food Insecurity Present (02/08/2023)   Received from [facility]   Hunger Vital Sign    Within the past 12 months, you worried that your food would run out before you got the money to buy more.: Sometimes true    Within the past 12 months, the food you bought just didn't last and you didn't have money to get more.: Never true  Transportation Needs: No Transportation Needs (02/08/2023)   Received from [facility] - Transportation    In the past 12 months, has lack of transportation  kept you from medical appointments or from getting medications?: No    Lack of Transportation (Non-Medical): No  Physical Activity: Not on file   Stress: Not on file  Social Connections: Not on file  Intimate Partner Violence: Not At Risk (05/07/2022)   Humiliation, Afraid, Rape, and Kick questionnaire    Fear of Current or Ex-Partner: No    Emotionally Abused: No    Physically Abused: No    Sexually Abused: No      Review of Systems  Constitutional:  Positive for appetite change, chills, fatigue and fever.  HENT:  Positive for congestion, postnasal drip, rhinorrhea, sinus pressure, sinus pain and sore throat.   Respiratory:  Positive for cough, chest tightness, shortness of breath and wheezing.   Cardiovascular: Negative.  Negative for chest pain and palpitations.  Gastrointestinal:  Positive for diarrhea and nausea. Negative for vomiting.  Musculoskeletal: Negative.  Negative for myalgias.  Neurological:  Positive for headaches.    Vital Signs: Ht 6' (1.829 m)   Wt 214 lb (97.1 kg)   BMI 29.02 kg/m    Observation/Objective: He is alert and oriented. No acute distress noted but patient is noted patient observed having significant coughing fits.     Assessment/Plan: 1. Walking pneumonia (Primary) Chest xray ordered. Take levofloxacin  until gone and take prednisone  taper until gone  - levofloxacin  (LEVAQUIN ) 750 MG tablet; Take 1 tablet (750 mg total) by mouth daily for 7 days. Take with food  Dispense: 7 tablet; Refill: 0 - predniSONE  (DELTASONE ) 10 MG tablet; Take one tab 3 x day for 3 days, then take one tab 2 x a day for 3 days and then take one tab a day for 3 days for copd  Dispense: 18 tablet; Refill: 0 - DG Chest 2 View; Future  2. Chronic obstructive pulmonary disease with acute exacerbation (HCC) Chest xray ordered. Take levofloxacin  until gone and take prednisone  taper until gone  - levofloxacin  (LEVAQUIN ) 750 MG tablet; Take 1 tablet (750 mg total) by mouth daily for 7 days. Take with food  Dispense: 7 tablet; Refill: 0 - predniSONE  (DELTASONE ) 10 MG tablet; Take one tab 3 x day for 3 days, then take one  tab 2 x a day for 3 days and then take one tab a day for 3 days for copd  Dispense: 18 tablet; Refill: 0 - DG Chest 2 View; Future  3. Narcolepsy due to underlying condition without cataplexy Discontinue sunosi , restart modafinil  as prescribed. Follow up in 3 months for additional refill.  - modafinil  (PROVIGIL ) 200 MG tablet; Take 1 tablet (200 mg total) by mouth daily.  Dispense: 30 tablet; Refill: 2   General Counseling: Randy Mclean verbalizes understanding of the findings of today's phone visit and agrees with plan of treatment. I have discussed any further diagnostic evaluation that may be needed or ordered today. We also reviewed his medications today. he has been encouraged to call the office with any questions or concerns that should arise related to todays visit.  Return if symptoms worsen or fail to improve, for F/U, Jachelle Fluty PCP modafinil  refills in 3 months .   Orders Placed This Encounter  Procedures   DG Chest 2 View    Meds ordered this encounter  Medications   levofloxacin  (LEVAQUIN ) 750 MG tablet    Sig: Take 1 tablet (750 mg total) by mouth daily for 7 days. Take with food    Dispense:  7 tablet    Refill:  0    Fill new  script today   predniSONE  (DELTASONE ) 10 MG tablet    Sig: Take one tab 3 x day for 3 days, then take one tab 2 x a day for 3 days and then take one tab a day for 3 days for copd    Dispense:  18 tablet    Refill:  0    Fill new script today   modafinil  (PROVIGIL ) 200 MG tablet    Sig: Take 1 tablet (200 mg total) by mouth daily.    Dispense:  30 tablet    Refill:  2    Discontinue sunosi  and fill new script today    Time spent:20 Minutes Time spent with patient included reviewing progress notes, labs, imaging studies, and discussing plan for follow up.  [place] Controlled Substance Database was reviewed by me for overdose risk score (ORS) if appropriate.  This patient was seen by Laurence Pons, FNP-C in collaboration with Dr. Verneta Gone as a part of  collaborative care agreement.  Leatrice Parilla R. Bobbi Burow, MSN, FNP-C Internal medicine

## 2023-08-12 ENCOUNTER — Ambulatory Visit
Admission: RE | Admit: 2023-08-12 | Discharge: 2023-08-12 | Disposition: A | Discharge status: Home / Self Care | Source: Ambulatory Visit | Attending: Nurse Practitioner | Admitting: Nurse Practitioner

## 2023-08-12 DIAGNOSIS — J189 Pneumonia, unspecified organism: Secondary | ICD-10-CM

## 2023-08-12 DIAGNOSIS — J441 Chronic obstructive pulmonary disease with (acute) exacerbation: Secondary | ICD-10-CM

## 2023-08-19 ENCOUNTER — Other Ambulatory Visit: Payer: Self-pay | Admitting: Nurse Practitioner

## 2023-08-19 DIAGNOSIS — Z79899 Other long term (current) drug therapy: Secondary | ICD-10-CM

## 2023-09-05 ENCOUNTER — Other Ambulatory Visit: Payer: Self-pay | Admitting: Urology

## 2023-09-05 ENCOUNTER — Other Ambulatory Visit: Payer: Self-pay

## 2023-09-05 ENCOUNTER — Other Ambulatory Visit: Payer: Self-pay | Admitting: Nurse Practitioner

## 2023-09-05 DIAGNOSIS — E782 Mixed hyperlipidemia: Secondary | ICD-10-CM

## 2023-09-05 DIAGNOSIS — I10 Essential (primary) hypertension: Secondary | ICD-10-CM

## 2023-09-08 ENCOUNTER — Telehealth: Payer: Self-pay | Admitting: Internal Medicine

## 2023-09-08 NOTE — Telephone Encounter (Signed)
 Lvm to move 11/09/23 appointment -Andree

## 2023-09-19 ENCOUNTER — Other Ambulatory Visit: Payer: Self-pay | Admitting: Nurse Practitioner

## 2023-09-19 DIAGNOSIS — Z79899 Other long term (current) drug therapy: Secondary | ICD-10-CM

## 2023-09-22 ENCOUNTER — Telehealth: Payer: Self-pay | Admitting: Internal Medicine

## 2023-09-22 NOTE — Telephone Encounter (Signed)
 Lvm to move 11/08/23 appointment -Andree

## 2023-09-28 ENCOUNTER — Telehealth: Payer: Self-pay | Admitting: Internal Medicine

## 2023-09-28 NOTE — Telephone Encounter (Signed)
 Left 2nd vm to move 11/08/23 appointment-[name]

## 2023-10-12 ENCOUNTER — Telehealth: Payer: Self-pay | Admitting: Internal Medicine

## 2023-10-12 NOTE — Telephone Encounter (Signed)
 Left 3rd vm to move 11/08/23 appointment/ s/w Prentice, he will have patient return my call-[name]

## 2023-10-13 ENCOUNTER — Other Ambulatory Visit: Payer: Self-pay

## 2023-10-13 DIAGNOSIS — R0602 Shortness of breath: Secondary | ICD-10-CM

## 2023-10-26 ENCOUNTER — Ambulatory Visit (INDEPENDENT_AMBULATORY_CARE_PROVIDER_SITE_OTHER): Admitting: Internal Medicine

## 2023-10-26 ENCOUNTER — Other Ambulatory Visit: Payer: Self-pay

## 2023-10-26 DIAGNOSIS — R972 Elevated prostate specific antigen [PSA]: Secondary | ICD-10-CM

## 2023-10-26 DIAGNOSIS — N401 Enlarged prostate with lower urinary tract symptoms: Secondary | ICD-10-CM

## 2023-10-26 DIAGNOSIS — R0602 Shortness of breath: Secondary | ICD-10-CM

## 2023-10-26 DIAGNOSIS — E291 Testicular hypofunction: Secondary | ICD-10-CM

## 2023-10-27 ENCOUNTER — Other Ambulatory Visit

## 2023-10-27 DIAGNOSIS — N401 Enlarged prostate with lower urinary tract symptoms: Secondary | ICD-10-CM

## 2023-10-27 DIAGNOSIS — R972 Elevated prostate specific antigen [PSA]: Secondary | ICD-10-CM

## 2023-10-27 DIAGNOSIS — E291 Testicular hypofunction: Secondary | ICD-10-CM

## 2023-10-28 ENCOUNTER — Ambulatory Visit: Payer: Self-pay | Admitting: Urology

## 2023-10-28 LAB — HEMOGLOBIN AND HEMATOCRIT, BLOOD
Hematocrit: 42.7 % (ref 37.5–51.0)
Hemoglobin: 13.2 g/dL (ref 13.0–17.7)

## 2023-10-28 LAB — TESTOSTERONE: Testosterone: 768 ng/dL (ref 264–916)

## 2023-10-30 NOTE — Procedures (Signed)
 [facility] [address] [place], [address]    Complete Pulmonary Function Testing Interpretation:  FINDINGS:  The forced vital capacity is normal.  FEV1 is normal.  Effort on FVC ratio is normal.  Total lung capacity is mildly decreased.  Residual volume is decreased.  FRC is decreased.  DLCO is increased.  Postbronchodilator no significant change noted in the FEV1.  IMPRESSION:  This pulmonary function study is consistent with mild restrictive lung disease correlation is recommended.  Randy DELENA Bathe, MD [facility] Pulmonary Critical Care Medicine Sleep Medicine

## 2023-11-02 LAB — PULMONARY FUNCTION TEST

## 2023-11-07 ENCOUNTER — Other Ambulatory Visit: Payer: Self-pay | Admitting: *Deleted

## 2023-11-07 DIAGNOSIS — D508 Other iron deficiency anemias: Secondary | ICD-10-CM

## 2023-11-07 NOTE — Progress Notes (Signed)
 cbc

## 2023-11-08 ENCOUNTER — Inpatient Hospital Stay: Attending: Oncology

## 2023-11-08 ENCOUNTER — Ambulatory Visit: Admitting: Internal Medicine

## 2023-11-08 DIAGNOSIS — D509 Iron deficiency anemia, unspecified: Secondary | ICD-10-CM | POA: Diagnosis present

## 2023-11-08 DIAGNOSIS — D508 Other iron deficiency anemias: Secondary | ICD-10-CM

## 2023-11-08 DIAGNOSIS — Z87891 Personal history of nicotine dependence: Secondary | ICD-10-CM | POA: Diagnosis not present

## 2023-11-08 LAB — CBC WITH DIFFERENTIAL/PLATELET
Abs Immature Granulocytes: 0.02 K/uL (ref 0.00–0.07)
Basophils Absolute: 0.1 K/uL (ref 0.0–0.1)
Basophils Relative: 1 %
Eosinophils Absolute: 0.1 K/uL (ref 0.0–0.5)
Eosinophils Relative: 2 %
HCT: 40.9 % (ref 39.0–52.0)
Hemoglobin: 12.8 g/dL — ABNORMAL LOW (ref 13.0–17.0)
Immature Granulocytes: 0 %
Lymphocytes Relative: 18 %
Lymphs Abs: 1.5 K/uL (ref 0.7–4.0)
MCH: 25.9 pg — ABNORMAL LOW (ref 26.0–34.0)
MCHC: 31.3 g/dL (ref 30.0–36.0)
MCV: 82.8 fL (ref 80.0–100.0)
Monocytes Absolute: 0.9 K/uL (ref 0.1–1.0)
Monocytes Relative: 11 %
Neutro Abs: 5.4 K/uL (ref 1.7–7.7)
Neutrophils Relative %: 68 %
Platelets: 246 K/uL (ref 150–400)
RBC: 4.94 MIL/uL (ref 4.22–5.81)
RDW: 16.1 % — ABNORMAL HIGH (ref 11.5–15.5)
WBC: 8 K/uL (ref 4.0–10.5)
nRBC: 0 % (ref 0.0–0.2)

## 2023-11-08 LAB — IRON AND TIBC
Iron: 83 ug/dL (ref 45–182)
Saturation Ratios: 20 % (ref 17.9–39.5)
TIBC: 423 ug/dL (ref 250–450)
UIBC: 340 ug/dL

## 2023-11-08 LAB — FERRITIN: Ferritin: 6 ng/mL — ABNORMAL LOW (ref 24–336)

## 2023-11-09 ENCOUNTER — Encounter: Payer: No Typology Code available for payment source | Admitting: Internal Medicine

## 2023-11-11 ENCOUNTER — Encounter: Payer: Self-pay | Admitting: Oncology

## 2023-11-11 ENCOUNTER — Inpatient Hospital Stay

## 2023-11-11 ENCOUNTER — Inpatient Hospital Stay: Admitting: Oncology

## 2023-11-11 VITALS — BP 138/81 | HR 86 | Temp 97.1°F | Resp 16 | Wt 224.0 lb

## 2023-11-11 VITALS — BP 129/77 | HR 70

## 2023-11-11 DIAGNOSIS — D508 Other iron deficiency anemias: Secondary | ICD-10-CM

## 2023-11-11 DIAGNOSIS — D509 Iron deficiency anemia, unspecified: Secondary | ICD-10-CM

## 2023-11-11 MED ORDER — IRON SUCROSE 20 MG/ML IV SOLN
200.0000 mg | Freq: Once | INTRAVENOUS | Status: AC
  Administered 2023-11-11: 200 mg via INTRAVENOUS
  Filled 2023-11-11: qty 10

## 2023-11-11 NOTE — Progress Notes (Signed)
 [facility]  Telephone:(336) [phone] Fax:(336) [phone]  ID: ALVIA TORY Younger OB: 1960-08-28  MR#: [id number]  RDW#:[id number]  Patient Care Team: Liana Fish, NP as PCP - General (Nurse Practitioner) Fernand Sigrid HERO, MD (Internal Medicine) Jacobo Evalene PARAS, MD as Consulting Physician (Oncology)   CHIEF COMPLAINT: Iron  deficiency anemia.  INTERVAL HISTORY: Patient returns to clinic today for repeat laboratory, further evaluation, and consideration of additional IV Venofer .  He currently feels well and is asymptomatic.  He does not complain of any weakness or fatigue today.  He has no neurologic complaints.  He denies any recent fevers or illnesses.  He has no chest pain, shortness of breath, cough, or hemoptysis.  He denies any nausea, vomiting, constipation, or diarrhea.  He has no melena or hematochezia.  He has no urinary complaints.  Patient offers no specific complaints today.  REVIEW OF SYSTEMS:   Review of Systems  Constitutional: Negative.  Negative for fever, malaise/fatigue and weight loss.  Respiratory: Negative.  Negative for cough and shortness of breath.   Cardiovascular: Negative.  Negative for chest pain and leg swelling.  Gastrointestinal: Negative.  Negative for abdominal pain, blood in stool and melena.  Genitourinary: Negative.  Negative for hematuria.  Musculoskeletal: Negative.  Negative for back pain and neck pain.  Skin: Negative.  Negative for rash.  Neurological: Negative.  Negative for dizziness, sensory change, focal weakness, weakness and headaches.  Psychiatric/Behavioral: Negative.  The patient is not nervous/anxious.     As per HPI. Otherwise, a complete review of systems is negative.  PAST MEDICAL HISTORY: Past Medical History:  Diagnosis Date   Acute anterolateral wall MI [facility])    a.) age of 73 (approx 26); denies LHC/PCI procedure   Acute appendicitis 12/04/2017   Allergy    takes allergy shots   Anxiety     a.) on BZO (alproazolam) PRN   Aortic atherosclerosis (HCC)    Arthritis    Asthma    Atypical chest pain    Barrett esophagus    Bilateral carotid artery stenosis    BPH (benign prostatic hyperplasia)    Cervicalgia    Chronic pain    CKD (chronic kidney disease) stage 3, GFR 30-59 ml/min (HCC)    Colon polyp    COPD (chronic obstructive pulmonary disease) (HCC)    Coronary artery disease    Depression    Diastolic dysfunction    a.) TTE 06/11/2019: EF >55%, mild LVH, G1DD, mild BAE, mild RVE, triv TR/PR, mild MR   Dieulafoy lesion of colon    Diverticulosis    Former smoker    GERD (gastroesophageal reflux disease)    Hashimoto's thyroiditis    Hemorrhoid    History of kidney stones    Hyperlipidemia    Hypertension    Hypothyroid    IBS (irritable bowel syndrome)    IDA (iron  deficiency anemia)    a.) has required parenteral iron  infusions   Left carpal tunnel syndrome    Male hypogonadism    a.) on exogenous TRT injections (depotestosterone cypionate)   Migraine    Narcolepsy    a.) takes modafinil    Pneumonia    Pre-diabetes    Right rotator cuff tendonitis    Seizures (HCC)    Spinal stenosis of lumbar region    a.) s/p spinal cord stimulator placement   Status post insertion of spinal cord stimulator     PAST SURGICAL HISTORY: Past Surgical History:  Procedure Laterality Date   ANTERIOR  CERVICAL DISCECTOMY     APPENDECTOMY     BACK SURGERY     BACK SURGERY  12/04/2020   COLONOSCOPY  09/2013   COLONOSCOPY N/A 05/09/2022   Procedure: COLONOSCOPY;  Surgeon: Onita Elspeth Sharper, DO;  Location: [facility] ENDOSCOPY;  Service: Gastroenterology;  Laterality: N/A;   elbow surgery     ESOPHAGOGASTRODUODENOSCOPY (EGD) WITH PROPOFOL  N/A 05/09/2022   Procedure: ESOPHAGOGASTRODUODENOSCOPY (EGD) WITH PROPOFOL ;  Surgeon: Onita Elspeth Sharper, DO;  Location: [facility] ENDOSCOPY;  Service: Gastroenterology;  Laterality: N/A;   GIVENS CAPSULE STUDY N/A 05/09/2022   Procedure:  GIVENS CAPSULE STUDY;  Surgeon: Onita Elspeth Sharper, DO;  Location: [facility] ENDOSCOPY;  Service: Gastroenterology;  Laterality: N/A;   HEMORRHOID SURGERY     HEMORRHOID SURGERY N/A 05/06/2023   Procedure: HEMORRHOIDECTOMY;  Surgeon: Tye Millet, DO;  Location: [facility] ORS;  Service: General;  Laterality: N/A;   LAPAROSCOPIC APPENDECTOMY N/A 12/04/2017   Procedure: APPENDECTOMY LAPAROSCOPIC;  Surgeon: Nicholaus Selinda Birmingham, MD;  Location: [facility] ORS;  Service: General;  Laterality: N/A;   LIPOMA EXCISION Right    leg   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     SPINAL CORD STIMULATOR IMPLANT     UPPER GI ENDOSCOPY  09/2013   WRIST SURGERY Left 01/14/2021    FAMILY HISTORY Family History  Problem Relation Age of Onset   Hypertension Mother    Lung disease Mother    Heart attack Father    Diabetes Father    Liver cancer Sister    Lung cancer Sister    Colon cancer Sister    Uterine cancer Sister        ADVANCED DIRECTIVES:    HEALTH MAINTENANCE: Social History   Tobacco Use   Smoking status: Former    Current packs/day: 0.00    Average packs/day: 1 pack/day for 30.0 years (30.0 ttl pk-yrs)    Types: Cigarettes    Start date: 02/23/1975    Quit date: 02/22/2005    Years since quitting: 18.7   Smokeless tobacco: Never  Vaping Use   Vaping status: Never Used  Substance Use Topics   Alcohol use: Yes    Alcohol/week: 0.0 standard drinks of alcohol    Comment: occasionally   Drug use: No    Comment: past     Colonoscopy:  PAP:  Bone density:  Lipid panel:  Allergies  Allergen Reactions   Amoxicillin Anaphylaxis    REACTION: unspecified   Gabapentin Anaphylaxis   Penicillin G Anaphylaxis    Current Outpatient Medications  Medication Sig Dispense Refill   alfuzosin  (UROXATRAL ) 10 MG 24 hr tablet TAKE 1 TABLET (10 MG TOTAL) BY MOUTH IN THE MORNING AND AT BEDTIME 180 tablet 1   ALPRAZolam  (XANAX ) 0.5 MG tablet Take 1 tablet (0.5 mg total) by mouth 3 (three) times daily  as needed for anxiety. 90 tablet 2   amLODipine  (NORVASC ) 10 MG tablet TAKE 1 TABLET DAILY 90 tablet 1   atorvastatin  (LIPITOR) 10 MG tablet TAKE 1 TABLET DAILY 90 tablet 1   benzonatate  (TESSALON ) 200 MG capsule TAKE 1 CAPSULE BY MOUTH 2 TIMES DAILY AS NEEDED FOR COUGH. 30 capsule 3   Budeson-Glycopyrrol-Formoterol  (BREZTRI  AEROSPHERE) 160-9-4.8 MCG/ACT AERO Inhale 2 puffs into the lungs in the morning and at bedtime. 10.7 g 5   buPROPion  (WELLBUTRIN  XL) 300 MG 24 hr tablet TAKE 1 TABLET DAILY 90 tablet 1   chlorpheniramine-HYDROcodone (TUSSIONEX) 10-8 MG/5ML Take 5 mLs by mouth every 12 (twelve) hours as needed. 140 mL 0  esomeprazole  (NEXIUM ) 40 MG capsule Take 1 capsule (40 mg total) by mouth 2 (two) times daily before a meal. 180 capsule 1   fexofenadine (ALLEGRA) 180 MG tablet Take 180 mg by mouth 2 (two) times daily.     FIBER GUMMIES PO Take 2 tablets by mouth daily.     hydrochlorothiazide  (HYDRODIURIL ) 12.5 MG tablet TAKE 1 TABLET BY MOUTH EVERY DAY 90 tablet 1   ipratropium-albuterol  (DUONEB) 0.5-2.5 (3) MG/3ML SOLN Take 3 mLs by nebulization every 6 (six) hours as needed (SOB/wheezing/cough). 360 mL 3   levothyroxine  (SYNTHROID ) 150 MCG tablet Take 1 tablet (150 mcg total) by mouth daily before breakfast. 90 tablet 1   methocarbamol  (ROBAXIN ) 750 MG tablet Take 750 mg by mouth 4 (four) times daily as needed for muscle spasms.     metoprolol  succinate (TOPROL -XL) 25 MG 24 hr tablet Take 25 mg by mouth at bedtime.     modafinil  (PROVIGIL ) 200 MG tablet Take 1 tablet (200 mg total) by mouth daily. 30 tablet 2   Multiple Vitamin (MULTI-VITAMINS) TABS Take 1 tablet by mouth daily.     oxyCODONE -acetaminophen  (PERCOCET/ROXICET) 5-325 MG tablet Take 1 tablet by mouth every 6 (six) hours as needed.     predniSONE  (DELTASONE ) 10 MG tablet Take one tab 3 x day for 3 days, then take one tab 2 x a day for 3 days and then take one tab a day for 3 days for copd 18 tablet 0   pregabalin  (LYRICA ) 100  MG capsule Take 1 capsule (100 mg total) by mouth every 8 (eight) hours. 10 capsule 0   senna (SENOKOT) 8.6 MG TABS tablet Take 1 tablet by mouth at bedtime.     SYRINGE-NEEDLE, DISP, 3 ML 22G X 1 3 ML MISC Use as directed.     testosterone  cypionate (DEPOTESTOSTERONE CYPIONATE) 200 MG/ML injection INJECT 0.7 MLS (140 MG TOTAL) INTO THE MUSCLE ONCE A WEEK. DISCARD EACH VIAL AFTER PUNCTURE 10 mL 0   tiZANidine  (ZANAFLEX ) 4 MG tablet Take 4 mg by mouth at bedtime.     valsartan  (DIOVAN ) 80 MG tablet Take 80 mg by mouth at bedtime.     No current facility-administered medications for this visit.    OBJECTIVE: Vitals:   11/11/23 1031  BP: 138/81  Pulse: 86  Resp: 16  Temp: (!) 97.1 F (36.2 C)  SpO2: 100%       Body mass index is 30.38 kg/m.    ECOG FS:0 - Asymptomatic  General: Well-developed, well-nourished, no acute distress. Eyes: Pink conjunctiva, anicteric sclera. HEENT: Normocephalic, moist mucous membranes. Lungs: No audible wheezing or coughing. Heart: Regular rate and rhythm. Abdomen: Soft, nontender, no obvious distention. Musculoskeletal: No edema, cyanosis, or clubbing. Neuro: Alert, answering all questions appropriately. Cranial nerves grossly intact. Skin: No rashes or petechiae noted. Psych: Normal affect.  LAB RESULTS:  Lab Results  Component Value Date   NA 138 05/03/2023   K 3.4 (L) 05/03/2023   CL 103 05/03/2023   CO2 28 05/03/2023   GLUCOSE 75 05/03/2023   BUN 13 05/03/2023   CREATININE 1.16 05/03/2023   CALCIUM  8.4 (L) 05/03/2023   PROT 7.0 10/18/2022   ALBUMIN 4.1 10/18/2022   AST 18 10/18/2022   ALT 20 10/18/2022   ALKPHOS 64 10/18/2022   BILITOT 0.4 10/18/2022   GFRNONAA >60 05/03/2023   GFRAA >60 05/05/2019    Lab Results  Component Value Date   WBC 8.0 11/08/2023   NEUTROABS 5.4 11/08/2023   HGB 12.8 (  L) 11/08/2023   HCT 40.9 11/08/2023   MCV 82.8 11/08/2023   PLT 246 11/08/2023   Lab Results  Component Value Date   IRON  83  11/08/2023   TIBC 423 11/08/2023   IRONPCTSAT 20 11/08/2023   Lab Results  Component Value Date   FERRITIN 6 (L) 11/08/2023     STUDIES: No results found.  ASSESSMENT: Iron  deficiency anemia.  PLAN:    Iron  deficiency anemia: EGD and colonoscopy on May 09, 2022 did not reveal any significant pathology.  Patient also had capsule endoscopy, but these results are not available at this time.  Patient underwent hemorrhoidectomy in March 2025.  Patient's hemoglobin continues to trend up and is now 12.8.  Iron  panel is within normal limits except for a persistently decreased ferritin level of 6.  Proceed with 200 mg IV Venofer  today.  Patient does not require additional treatment.  Return to clinic in 3 months for repeat laboratory work, further evaluation, and continuation of treatment if needed.     GI bleed: Resolved.  Hemorrhoidectomy as above. Family history of colon cancer:  Patient gets routine colonoscopies given his family history of colon cancer, all of which been unrevealing.  His genetic testing is negative. PSA: Previously increased, but now within normal limits at 1.7. Hypertension: Patient's blood pressure is within normal limits today.  Continue monitoring treatment per primary care.  I spent a total of 30 minutes reviewing chart data, face-to-face evaluation with the patient, counseling and coordination of care as detailed above.    Patient expressed understanding and was in agreement with this plan. He also understands that He can call clinic at any time with any questions, concerns, or complaints.    Evalene JINNY Reusing, MD   11/11/2023 11:01 AM

## 2023-11-12 ENCOUNTER — Other Ambulatory Visit: Payer: Self-pay | Admitting: Nurse Practitioner

## 2023-11-12 DIAGNOSIS — G47429 Narcolepsy in conditions classified elsewhere without cataplexy: Secondary | ICD-10-CM

## 2023-11-14 ENCOUNTER — Other Ambulatory Visit: Payer: Self-pay | Admitting: Nurse Practitioner

## 2023-11-14 ENCOUNTER — Other Ambulatory Visit: Payer: Self-pay | Admitting: Urology

## 2023-11-14 DIAGNOSIS — G47429 Narcolepsy in conditions classified elsewhere without cataplexy: Secondary | ICD-10-CM

## 2023-11-14 DIAGNOSIS — Z79899 Other long term (current) drug therapy: Secondary | ICD-10-CM

## 2023-11-15 ENCOUNTER — Telehealth: Payer: Self-pay | Admitting: Nurse Practitioner

## 2023-11-15 NOTE — Telephone Encounter (Signed)
 Lvm to schedule follow to continue refills-[name]

## 2023-11-22 ENCOUNTER — Ambulatory Visit: Admitting: Internal Medicine

## 2023-11-28 ENCOUNTER — Other Ambulatory Visit: Payer: Self-pay | Admitting: Nurse Practitioner

## 2023-11-28 DIAGNOSIS — Z79899 Other long term (current) drug therapy: Secondary | ICD-10-CM

## 2023-11-28 DIAGNOSIS — E782 Mixed hyperlipidemia: Secondary | ICD-10-CM

## 2023-11-28 DIAGNOSIS — I10 Essential (primary) hypertension: Secondary | ICD-10-CM

## 2023-11-28 DIAGNOSIS — E039 Hypothyroidism, unspecified: Secondary | ICD-10-CM

## 2023-11-29 ENCOUNTER — Encounter: Payer: Self-pay | Admitting: Nurse Practitioner

## 2023-11-29 ENCOUNTER — Ambulatory Visit: Admitting: Nurse Practitioner

## 2023-11-29 VITALS — BP 130/74 | HR 75 | Temp 98.1°F | Resp 16 | Ht 72.0 in | Wt 224.0 lb

## 2023-11-29 DIAGNOSIS — M25551 Pain in right hip: Secondary | ICD-10-CM

## 2023-11-29 DIAGNOSIS — M25552 Pain in left hip: Secondary | ICD-10-CM

## 2023-11-29 DIAGNOSIS — Z79899 Other long term (current) drug therapy: Secondary | ICD-10-CM

## 2023-11-29 DIAGNOSIS — G47429 Narcolepsy in conditions classified elsewhere without cataplexy: Secondary | ICD-10-CM

## 2023-11-29 DIAGNOSIS — G8929 Other chronic pain: Secondary | ICD-10-CM

## 2023-11-29 DIAGNOSIS — J432 Centrilobular emphysema: Secondary | ICD-10-CM

## 2023-11-29 DIAGNOSIS — E782 Mixed hyperlipidemia: Secondary | ICD-10-CM

## 2023-11-29 DIAGNOSIS — Z113 Encounter for screening for infections with a predominantly sexual mode of transmission: Secondary | ICD-10-CM

## 2023-11-29 DIAGNOSIS — E039 Hypothyroidism, unspecified: Secondary | ICD-10-CM

## 2023-11-29 DIAGNOSIS — Z23 Encounter for immunization: Secondary | ICD-10-CM

## 2023-11-29 DIAGNOSIS — R053 Chronic cough: Secondary | ICD-10-CM

## 2023-11-29 DIAGNOSIS — I10 Essential (primary) hypertension: Secondary | ICD-10-CM

## 2023-11-29 MED ORDER — LEVOTHYROXINE SODIUM 150 MCG PO TABS
150.0000 ug | ORAL_TABLET | Freq: Every day | ORAL | 1 refills | Status: AC
Start: 2023-11-29 — End: ?

## 2023-11-29 MED ORDER — HYDROCHLOROTHIAZIDE 12.5 MG PO TABS: 12.5000 mg | ORAL_TABLET | Freq: Every day | ORAL | 1 refills | Status: DC

## 2023-11-29 MED ORDER — BUPROPION HCL ER (XL) 300 MG PO TB24: 300.0000 mg | ORAL_TABLET | Freq: Every day | ORAL | 1 refills | Status: AC

## 2023-11-29 MED ORDER — ESOMEPRAZOLE MAGNESIUM 40 MG PO CPDR: 40.0000 mg | DELAYED_RELEASE_CAPSULE | Freq: Two times a day (BID) | ORAL | 1 refills | Status: AC

## 2023-11-29 MED ORDER — AMLODIPINE BESYLATE 10 MG PO TABS: 10.0000 mg | ORAL_TABLET | Freq: Every day | ORAL | 1 refills | Status: AC

## 2023-11-29 MED ORDER — BREZTRI AEROSPHERE 160-9-4.8 MCG/ACT IN AERO: 2.0000 | INHALATION_SPRAY | Freq: Two times a day (BID) | RESPIRATORY_TRACT | 5 refills | Status: DC

## 2023-11-29 MED ORDER — ATORVASTATIN CALCIUM 10 MG PO TABS: 10.0000 mg | ORAL_TABLET | Freq: Every day | ORAL | 1 refills | Status: AC

## 2023-11-29 MED ORDER — MODAFINIL 200 MG PO TABS: 200.0000 mg | ORAL_TABLET | Freq: Every day | ORAL | 2 refills | Status: DC

## 2023-11-29 MED ORDER — BENZONATATE 200 MG PO CAPS: 200.0000 mg | ORAL_CAPSULE | Freq: Two times a day (BID) | ORAL | 3 refills | Status: AC | PRN

## 2023-11-29 MED ORDER — ALPRAZOLAM 0.5 MG PO TABS: 0.5000 mg | ORAL_TABLET | Freq: Three times a day (TID) | ORAL | 2 refills | Status: DC | PRN

## 2023-11-29 NOTE — Progress Notes (Signed)
 [facility] [address] [place], [place] [address]  Internal MEDICINE  Office Visit Note  Patient Name: Randy Mclean  [id number]  [id number]  Date of Service: 11/29/2023  Chief Complaint  Patient presents with   Depression   Gastroesophageal Reflux   Hypertension   Hyperlipidemia   Follow-up    HPI Randy Mclean presents for a follow-up visit for hip pain bilaterally, updated vaccine records, flu shot and COPD and narcolepsy Requesting flu vaccine Updated immunization records.  Bilateral hip pain -- reports that this started a couple of months ago. Reprots that he has sharp stabbing pains in both hips after standing for more than 5 minutes. Denies any pain when sitting or laying down. Reports that there is increased pain in both hips when walking. Has tried lidocain patches and percocet with [pain doc but this is not helping, wants to see ortho Doing well with breztri  -- sees Dr. Elfreda Bathe as well for monitoring and management of COPD Narcolepsy -- tried sunosi  which was not effective for the patient. He previously tried adderall as well. He is currently taking modafinil  which is effective for keeping him awake during the day and works better than adderall or sunosi . Previously we tried to get Lumryz  covered by insurance but were not successful. He sees Dr. Elfreda Bathe who monitors this medical problem as well.    Current Medication: Outpatient Encounter Medications as of 11/29/2023  Medication Sig   ALPRAZolam  (XANAX ) 0.5 MG tablet Take 1 tablet (0.5 mg total) by mouth 3 (three) times daily as needed for anxiety.   amLODipine  (NORVASC ) 10 MG tablet Take 1 tablet (10 mg total) by mouth daily.   atorvastatin  (LIPITOR) 10 MG tablet Take 1 tablet (10 mg total) by mouth daily.   benzonatate  (TESSALON ) 200 MG capsule Take 1 capsule (200 mg total) by mouth 2 (two) times daily as needed for cough.   budesonide-glycopyrrolate-formoterol  (BREZTRI  AEROSPHERE) 160-9-4.8  MCG/ACT AERO inhaler Inhale 2 puffs into the lungs in the morning and at bedtime.   buPROPion  (WELLBUTRIN  XL) 300 MG 24 hr tablet Take 1 tablet (300 mg total) by mouth daily.   esomeprazole  (NEXIUM ) 40 MG capsule Take 1 capsule (40 mg total) by mouth 2 (two) times daily before a meal.   fexofenadine (ALLEGRA) 180 MG tablet Take 180 mg by mouth 2 (two) times daily.   FIBER GUMMIES PO Take 2 tablets by mouth daily.   hydrochlorothiazide  (HYDRODIURIL ) 12.5 MG tablet Take 1 tablet (12.5 mg total) by mouth daily.   ipratropium-albuterol  (DUONEB) 0.5-2.5 (3) MG/3ML SOLN Take 3 mLs by nebulization every 6 (six) hours as needed (SOB/wheezing/cough).   levothyroxine  (SYNTHROID ) 150 MCG tablet Take 1 tablet (150 mcg total) by mouth daily before breakfast.   methocarbamol  (ROBAXIN ) 750 MG tablet Take 750 mg by mouth 4 (four) times daily as needed for muscle spasms.   metoprolol  succinate (TOPROL -XL) 25 MG 24 hr tablet Take 25 mg by mouth at bedtime.   modafinil  (PROVIGIL ) 200 MG tablet Take 1 tablet (200 mg total) by mouth daily.   Multiple Vitamin (MULTI-VITAMINS) TABS Take 1 tablet by mouth daily.   oxyCODONE -acetaminophen  (PERCOCET/ROXICET) 5-325 MG tablet Take 1 tablet by mouth every 6 (six) hours as needed.   pregabalin  (LYRICA ) 100 MG capsule Take 1 capsule (100 mg total) by mouth every 8 (eight) hours.   senna (SENOKOT) 8.6 MG TABS tablet Take 1 tablet by mouth at bedtime.   SYRINGE-NEEDLE, DISP, 3 ML 22G X 1 3 ML MISC  Use as directed.   testosterone  cypionate (DEPOTESTOSTERONE CYPIONATE) 200 MG/ML injection INJECT 0.7 MLS (140 MG TOTAL) INTO THE MUSCLE ONCE A WEEK. (DISCARD VIAL AFTER SINGLE USE)   tiZANidine  (ZANAFLEX ) 4 MG tablet Take 4 mg by mouth at bedtime.   valsartan  (DIOVAN ) 80 MG tablet Take 80 mg by mouth at bedtime.   [DISCONTINUED] alfuzosin  (UROXATRAL ) 10 MG 24 hr tablet TAKE 1 TABLET (10 MG TOTAL) BY MOUTH IN THE MORNING AND AT BEDTIME   [DISCONTINUED] ALPRAZolam  (XANAX ) 0.5 MG tablet  TAKE 1 TABLET (0.5 MG TOTAL) BY MOUTH 3 (THREE) TIMES DAILY AS NEEDED FOR ANXIETY.   [DISCONTINUED] amLODipine  (NORVASC ) 10 MG tablet TAKE 1 TABLET DAILY   [DISCONTINUED] atorvastatin  (LIPITOR) 10 MG tablet TAKE 1 TABLET DAILY   [DISCONTINUED] benzonatate  (TESSALON ) 200 MG capsule TAKE 1 CAPSULE BY MOUTH 2 TIMES DAILY AS NEEDED FOR COUGH.   [DISCONTINUED] Budeson-Glycopyrrol-Formoterol  (BREZTRI  AEROSPHERE) 160-9-4.8 MCG/ACT AERO Inhale 2 puffs into the lungs in the morning and at bedtime.   [DISCONTINUED] buPROPion  (WELLBUTRIN  XL) 300 MG 24 hr tablet TAKE 1 TABLET DAILY   [DISCONTINUED] chlorpheniramine-HYDROcodone (TUSSIONEX) 10-8 MG/5ML Take 5 mLs by mouth every 12 (twelve) hours as needed. (Patient not taking: Reported on 11/29/2023)   [DISCONTINUED] esomeprazole  (NEXIUM ) 40 MG capsule Take 1 capsule (40 mg total) by mouth 2 (two) times daily before a meal.   [DISCONTINUED] hydrochlorothiazide  (HYDRODIURIL ) 12.5 MG tablet TAKE 1 TABLET BY MOUTH EVERY DAY   [DISCONTINUED] levothyroxine  (SYNTHROID ) 150 MCG tablet Take 1 tablet (150 mcg total) by mouth daily before breakfast.   [DISCONTINUED] modafinil  (PROVIGIL ) 200 MG tablet TAKE 1 TABLET BY MOUTH EVERY DAY   [DISCONTINUED] predniSONE  (DELTASONE ) 10 MG tablet Take one tab 3 x day for 3 days, then take one tab 2 x a day for 3 days and then take one tab a day for 3 days for copd (Patient not taking: Reported on 11/29/2023)   No facility-administered encounter medications on file as of 11/29/2023.    Surgical History: Past Surgical History:  Procedure Laterality Date   ANTERIOR CERVICAL DISCECTOMY     APPENDECTOMY     BACK SURGERY     BACK SURGERY  12/04/2020   COLONOSCOPY  09/2013   COLONOSCOPY N/A 05/09/2022   Procedure: COLONOSCOPY;  Surgeon: Onita Elspeth Sharper, DO;  Location: [facility] ENDOSCOPY;  Service: Gastroenterology;  Laterality: N/A;   elbow surgery     ESOPHAGOGASTRODUODENOSCOPY (EGD) WITH PROPOFOL  N/A 05/09/2022   Procedure:  ESOPHAGOGASTRODUODENOSCOPY (EGD) WITH PROPOFOL ;  Surgeon: Onita Elspeth Sharper, DO;  Location: [facility] ENDOSCOPY;  Service: Gastroenterology;  Laterality: N/A;   GIVENS CAPSULE STUDY N/A 05/09/2022   Procedure: GIVENS CAPSULE STUDY;  Surgeon: Onita Elspeth Sharper, DO;  Location: [facility] ENDOSCOPY;  Service: Gastroenterology;  Laterality: N/A;   HEMORRHOID SURGERY     HEMORRHOID SURGERY N/A 05/06/2023   Procedure: HEMORRHOIDECTOMY;  Surgeon: Tye Millet, DO;  Location: [facility] ORS;  Service: General;  Laterality: N/A;   LAPAROSCOPIC APPENDECTOMY N/A 12/04/2017   Procedure: APPENDECTOMY LAPAROSCOPIC;  Surgeon: Nicholaus Selinda Birmingham, MD;  Location: [facility] ORS;  Service: General;  Laterality: N/A;   LIPOMA EXCISION Right    leg   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     SPINAL CORD STIMULATOR IMPLANT     UPPER GI ENDOSCOPY  09/2013   WRIST SURGERY Left 01/14/2021    Medical History: Past Medical History:  Diagnosis Date   Acute anterolateral wall [facility])    a.) age of 83 (approx 24); denies LHC/PCI procedure  Acute appendicitis 12/04/2017   Allergy    takes allergy shots   Anxiety    a.) on BZO (alproazolam) PRN   Aortic atherosclerosis    Arthritis    Asthma    Atypical chest pain    Barrett esophagus    Bilateral carotid artery stenosis    BPH (benign prostatic hyperplasia)    Cervicalgia    Chronic pain    CKD (chronic kidney disease) stage 3, GFR 30-59 ml/min (HCC)    Colon polyp    COPD (chronic obstructive pulmonary disease) (HCC)    Coronary artery disease    Depression    Diastolic dysfunction    a.) TTE 06/11/2019: EF >55%, mild LVH, G1DD, mild BAE, mild RVE, triv TR/PR, mild MR   Dieulafoy lesion of colon    Diverticulosis    Former smoker    GERD (gastroesophageal reflux disease)    Hashimoto's thyroiditis    Hemorrhoid    History of kidney stones    Hyperlipidemia    Hypertension    Hypothyroid    IBS (irritable bowel syndrome)    IDA (iron  deficiency anemia)     a.) has required parenteral iron  infusions   Left carpal tunnel syndrome    Male hypogonadism    a.) on exogenous TRT injections (depotestosterone cypionate)   Migraine    Narcolepsy    a.) takes modafinil    Pneumonia    Pre-diabetes    Right rotator cuff tendonitis    Seizures (HCC)    Spinal stenosis of lumbar region    a.) s/p spinal cord stimulator placement   Status post insertion of spinal cord stimulator     Family History: Family History  Problem Relation Age of Onset   Hypertension Mother    Lung disease Mother    Heart attack Father    Diabetes Father    Liver cancer Sister    Lung cancer Sister    Colon cancer Sister    Uterine cancer Sister     Social History   Socioeconomic History   Marital status: Married    Spouse name: Not on file   Number of children: Not on file   Years of education: Not on file   Highest education level: Not on file  Occupational History   Not on file  Tobacco Use   Smoking status: Former    Current packs/day: 0.00    Average packs/day: 1 pack/day for 30.0 years (30.0 ttl pk-yrs)    Types: Cigarettes    Start date: 02/23/1975    Quit date: 02/22/2005    Years since quitting: 18.8   Smokeless tobacco: Never  Vaping Use   Vaping status: Never Used  Substance and Sexual Activity   Alcohol use: Yes    Alcohol/week: 0.0 standard drinks of alcohol    Comment: occasionally   Drug use: No    Comment: past   Sexual activity: Yes  Other Topics Concern   Not on file  Social History Narrative   Not on file   Social Drivers of Health   Financial Resource Strain: Low Risk  (02/08/2023)   Received from [facility]   Overall Financial Resource Strain (CARDIA)    Difficulty of Paying Living Expenses: Not very hard  Food Insecurity: Food Insecurity Present (02/08/2023)   Received from [facility]   Hunger Vital Sign    Within the past 12 months, you worried that your food would run out before  you got  the money to buy more.: Sometimes true    Within the past 12 months, the food you bought just didn't last and you didn't have money to get more.: Never true  Transportation Needs: No Transportation Needs (02/08/2023)   Received from [facility] - Transportation    In the past 12 months, has lack of transportation kept you from medical appointments or from getting medications?: No    Lack of Transportation (Non-Medical): No  Physical Activity: Not on file  Stress: Not on file  Social Connections: Not on file  Intimate Partner Violence: Not At Risk (05/07/2022)   Humiliation, Afraid, Rape, and Kick questionnaire    Fear of Current or Ex-Partner: No    Emotionally Abused: No    Physically Abused: No    Sexually Abused: No      Review of Systems  Constitutional:  Positive for fatigue. Negative for appetite change, chills and fever.  HENT: Negative.    Respiratory:  Positive for cough (improving). Negative for chest tightness, shortness of breath and wheezing.   Cardiovascular: Negative.  Negative for chest pain and palpitations.  Gastrointestinal: Negative.   Genitourinary: Negative.   Musculoskeletal:  Positive for arthralgias, back pain and gait problem.  Psychiatric/Behavioral:  Positive for sleep disturbance. Negative for self-injury. The patient is nervous/anxious.     Vital Signs: BP 130/74   Pulse 75   Temp 98.1 F (36.7 C)   Resp 16   Ht 6' (1.829 m)   Wt 224 lb (101.6 kg)   SpO2 96%   BMI 30.38 kg/m    Physical Exam Vitals reviewed.  Constitutional:      General: He is not in acute distress.    Appearance: Normal appearance. He is obese. He is not ill-appearing.  HENT:     Head: Normocephalic and atraumatic.  Eyes:     Pupils: Pupils are equal, round, and reactive to light.  Cardiovascular:     Rate and Rhythm: Normal rate and regular rhythm.     Heart sounds: Normal heart sounds. No murmur heard. Pulmonary:     Effort:  Pulmonary effort is normal. No respiratory distress.     Breath sounds: Normal breath sounds. No wheezing.  Skin:    General: Skin is warm and dry.     Capillary Refill: Capillary refill takes less than 2 seconds.  Neurological:     Mental Status: He is alert and oriented to person, place, and time.     Cranial Nerves: No cranial nerve deficit.     Coordination: Coordination normal.     Gait: Gait normal.  Psychiatric:        Mood and Affect: Mood normal.        Behavior: Behavior normal.        Assessment/Plan: 1. Primary hypertension (Primary) Stable, continue amlodipine  and hydrochlorothiazide  as prescribed.  - amLODipine  (NORVASC ) 10 MG tablet; Take 1 tablet (10 mg total) by mouth daily.  Dispense: 90 tablet; Refill: 1 - hydrochlorothiazide  (HYDRODIURIL ) 12.5 MG tablet; Take 1 tablet (12.5 mg total) by mouth daily.  Dispense: 90 tablet; Refill: 1  2. Mixed hyperlipidemia Continue atorvastatin  as prescribed.  - atorvastatin  (LIPITOR) 10 MG tablet; Take 1 tablet (10 mg total) by mouth daily.  Dispense: 90 tablet; Refill: 1  3. Centrilobular emphysema (HCC) Continue breztri  inhaler as prescribed.  - budesonide-glycopyrrolate-formoterol  (BREZTRI  AEROSPHERE) 160-9-4.8 MCG/ACT AERO inhaler; Inhale 2 puffs into the lungs in the morning and at bedtime.  Dispense: 10.7 g;  Refill: 5  4. Chronic hip pain, bilateral Referred to orthopedic surgery  - Ambulatory referral to Orthopedic Surgery  5. Chronic cough Continue breztri  as prescribed. Continue benzonatate  as needed as prescribed.  - budesonide-glycopyrrolate-formoterol  (BREZTRI  AEROSPHERE) 160-9-4.8 MCG/ACT AERO inhaler; Inhale 2 puffs into the lungs in the morning and at bedtime.  Dispense: 10.7 g; Refill: 5 - benzonatate  (TESSALON ) 200 MG capsule; Take 1 capsule (200 mg total) by mouth 2 (two) times daily as needed for cough.  Dispense: 30 capsule; Refill: 3  6. Acquired hypothyroidism Continue levothyroxine  as prescribed.   - levothyroxine  (SYNTHROID ) 150 MCG tablet; Take 1 tablet (150 mcg total) by mouth daily before breakfast.  Dispense: 90 tablet; Refill: 1  7. Narcolepsy due to underlying condition without cataplexy Continue modafinil  as prescribed. Follow up in 3 months for additional refills.  - modafinil  (PROVIGIL ) 200 MG tablet; Take 1 tablet (200 mg total) by mouth daily.  Dispense: 30 tablet; Refill: 2  8. Needs flu shot Flu vaccine administered in office today  - Influenza, MDCK, trivalent, PF(Flucelvax egg-free)  9. Screen for sexually transmitted diseases Urine sent for STD testing and lab ordered for blood testing for STDs - Chlamydia/Gonococcus/Trichomonas, NAA - STI Profile - Interpretation:  10. Encounter for medication review Medication list reviewed, updated and refills ordered  - ALPRAZolam  (XANAX ) 0.5 MG tablet; Take 1 tablet (0.5 mg total) by mouth 3 (three) times daily as needed for anxiety.  Dispense: 90 tablet; Refill: 2 - buPROPion  (WELLBUTRIN  XL) 300 MG 24 hr tablet; Take 1 tablet (300 mg total) by mouth daily.  Dispense: 90 tablet; Refill: 1 - esomeprazole  (NEXIUM ) 40 MG capsule; Take 1 capsule (40 mg total) by mouth 2 (two) times daily before a meal.  Dispense: 180 capsule; Refill: 1   General Counseling: Randy Mclean verbalizes understanding of the findings of todays visit and agrees with plan of treatment. I have discussed any further diagnostic evaluation that may be needed or ordered today. We also reviewed his medications today. he has been encouraged to call the office with any questions or concerns that should arise related to todays visit.    Orders Placed This Encounter  Procedures   Chlamydia/Gonococcus/Trichomonas, NAA   Influenza, MDCK, trivalent, PF(Flucelvax egg-free)   STI Profile   Interpretation:   Ambulatory referral to Orthopedic Surgery    Meds ordered this encounter  Medications   ALPRAZolam  (XANAX ) 0.5 MG tablet    Sig: Take 1 tablet (0.5 mg total) by  mouth 3 (three) times daily as needed for anxiety.    Dispense:  90 tablet    Refill:  2    This request is for a new prescription for a controlled substance as required by Federal/State law.   amLODipine  (NORVASC ) 10 MG tablet    Sig: Take 1 tablet (10 mg total) by mouth daily.    Dispense:  90 tablet    Refill:  1   atorvastatin  (LIPITOR) 10 MG tablet    Sig: Take 1 tablet (10 mg total) by mouth daily.    Dispense:  90 tablet    Refill:  1   budesonide-glycopyrrolate-formoterol  (BREZTRI  AEROSPHERE) 160-9-4.8 MCG/ACT AERO inhaler    Sig: Inhale 2 puffs into the lungs in the morning and at bedtime.    Dispense:  10.7 g    Refill:  5   benzonatate  (TESSALON ) 200 MG capsule    Sig: Take 1 capsule (200 mg total) by mouth 2 (two) times daily as needed for cough.    Dispense:  30 capsule    Refill:  3   buPROPion  (WELLBUTRIN  XL) 300 MG 24 hr tablet    Sig: Take 1 tablet (300 mg total) by mouth daily.    Dispense:  90 tablet    Refill:  1   esomeprazole  (NEXIUM ) 40 MG capsule    Sig: Take 1 capsule (40 mg total) by mouth 2 (two) times daily before a meal.    Dispense:  180 capsule    Refill:  1   hydrochlorothiazide  (HYDRODIURIL ) 12.5 MG tablet    Sig: Take 1 tablet (12.5 mg total) by mouth daily.    Dispense:  90 tablet    Refill:  1   levothyroxine  (SYNTHROID ) 150 MCG tablet    Sig: Take 1 tablet (150 mcg total) by mouth daily before breakfast.    Dispense:  90 tablet    Refill:  1    For future refills, 90 day supply   modafinil  (PROVIGIL ) 200 MG tablet    Sig: Take 1 tablet (200 mg total) by mouth daily.    Dispense:  30 tablet    Refill:  2    Not to exceed 3 additional fills before 02/06/2024    Return in about 3 months (around 02/22/2024) for F/U, Darroll Bredeson PCP modafinil  refills.   Total time spent:30 Minutes Time spent includes review of chart, medications, test results, and follow up plan with the patient.   [place] Controlled Substance Database was reviewed by  me.  This patient was seen by Mardy Maxin, FNP-C in collaboration with Dr. Sigrid Bathe as a part of collaborative care agreement.   Krystl Wickware R. Maxin, MSN, FNP-C Internal medicine

## 2023-11-30 ENCOUNTER — Telehealth: Payer: Self-pay | Admitting: Nurse Practitioner

## 2023-11-30 NOTE — Telephone Encounter (Signed)
 Awaiting 11/29/23 office notes for Orthopedic referral-[name]

## 2023-12-02 LAB — CHLAMYDIA/GONOCOCCUS/TRICHOMONAS, NAA
Chlamydia by NAA: NEGATIVE
Gonococcus by NAA: NEGATIVE
Trich vag by NAA: NEGATIVE

## 2023-12-03 LAB — STI PROFILE
HCV Ab: NONREACTIVE
HIV Screen 4th Generation wRfx: NONREACTIVE
Hep B Core Total Ab: NEGATIVE
Hep B Surface Ab, Qual: NONREACTIVE
Hepatitis B Surface Ag: NEGATIVE
RPR Ser Ql: NONREACTIVE

## 2023-12-03 LAB — HCV INTERPRETATION

## 2023-12-06 ENCOUNTER — Encounter: Payer: Self-pay | Admitting: Internal Medicine

## 2023-12-06 ENCOUNTER — Ambulatory Visit (INDEPENDENT_AMBULATORY_CARE_PROVIDER_SITE_OTHER): Admitting: Internal Medicine

## 2023-12-06 VITALS — BP 164/77 | HR 84 | Temp 98.3°F | Resp 16 | Ht 72.0 in | Wt 226.6 lb

## 2023-12-06 DIAGNOSIS — R911 Solitary pulmonary nodule: Secondary | ICD-10-CM

## 2023-12-06 DIAGNOSIS — J432 Centrilobular emphysema: Secondary | ICD-10-CM

## 2023-12-06 DIAGNOSIS — M1610 Unilateral primary osteoarthritis, unspecified hip: Secondary | ICD-10-CM | POA: Diagnosis not present

## 2023-12-06 NOTE — Progress Notes (Signed)
 [facility] [address] [place], [place] [address]  Pulmonary Sleep Medicine   Office Visit Note  Patient Name: Randy Mclean Younger DOB: December 18, 1960 MRN [id number]  Date of Service: 12/06/2023  Complaints/HPI: He is doing well overall. He had a PFT done and this shows normal spiro but reduced TLC. Patient has had hip issues and states he is working with ortho referral. Patient yet to see ortho. He states there s a family history of arthritis. Patient is a former smoker. Patient had a CXR done which showed some abnormality in the RUL. Patient had a CT chest done over a year ago that was clear. Patient needs a RA and sed rate  Office Spirometry Results:     ROS  General: (-) fever, (-) chills, (-) night sweats, (-) weakness Skin: (-) rashes, (-) itching,. Eyes: (-) visual changes, (-) redness, (-) itching. Nose and Sinuses: (-) nasal stuffiness or itchiness, (-) postnasal drip, (-) nosebleeds, (-) sinus trouble. Mouth and Throat: (-) sore throat, (-) hoarseness. Neck: (-) swollen glands, (-) enlarged thyroid , (-) neck pain. Respiratory: - cough, (-) bloody sputum, - shortness of breath, - wheezing. Cardiovascular: - ankle swelling, (-) chest pain. Lymphatic: (-) lymph node enlargement. Neurologic: (-) numbness, (-) tingling. Psychiatric: (-) anxiety, (-) depression   Current Medication: Outpatient Encounter Medications as of 12/06/2023  Medication Sig   alfuzosin  (UROXATRAL ) 10 MG 24 hr tablet TAKE 1 TABLET AT BEDTIME   ALPRAZolam  (XANAX ) 0.5 MG tablet Take 1 tablet (0.5 mg total) by mouth 3 (three) times daily as needed for anxiety.   amLODipine  (NORVASC ) 10 MG tablet Take 1 tablet (10 mg total) by mouth daily.   atorvastatin  (LIPITOR) 10 MG tablet Take 1 tablet (10 mg total) by mouth daily.   benzonatate  (TESSALON ) 200 MG capsule Take 1 capsule (200 mg total) by mouth 2 (two) times daily as needed for cough.   budesonide-glycopyrrolate-formoterol  (BREZTRI   AEROSPHERE) 160-9-4.8 MCG/ACT AERO inhaler Inhale 2 puffs into the lungs in the morning and at bedtime.   buPROPion  (WELLBUTRIN  XL) 300 MG 24 hr tablet Take 1 tablet (300 mg total) by mouth daily.   esomeprazole  (NEXIUM ) 40 MG capsule Take 1 capsule (40 mg total) by mouth 2 (two) times daily before a meal.   fexofenadine (ALLEGRA) 180 MG tablet Take 180 mg by mouth 2 (two) times daily.   FIBER GUMMIES PO Take 2 tablets by mouth daily.   hydrochlorothiazide  (HYDRODIURIL ) 12.5 MG tablet Take 1 tablet (12.5 mg total) by mouth daily.   ipratropium-albuterol  (DUONEB) 0.5-2.5 (3) MG/3ML SOLN Take 3 mLs by nebulization every 6 (six) hours as needed (SOB/wheezing/cough).   levothyroxine  (SYNTHROID ) 150 MCG tablet Take 1 tablet (150 mcg total) by mouth daily before breakfast.   methocarbamol  (ROBAXIN ) 750 MG tablet Take 750 mg by mouth 4 (four) times daily as needed for muscle spasms.   metoprolol  succinate (TOPROL -XL) 25 MG 24 hr tablet Take 25 mg by mouth at bedtime.   modafinil  (PROVIGIL ) 200 MG tablet Take 1 tablet (200 mg total) by mouth daily.   Multiple Vitamin (MULTI-VITAMINS) TABS Take 1 tablet by mouth daily.   oxyCODONE -acetaminophen  (PERCOCET/ROXICET) 5-325 MG tablet Take 1 tablet by mouth every 6 (six) hours as needed.   pregabalin  (LYRICA ) 100 MG capsule Take 1 capsule (100 mg total) by mouth every 8 (eight) hours.   senna (SENOKOT) 8.6 MG TABS tablet Take 1 tablet by mouth at bedtime.   SYRINGE-NEEDLE, DISP, 3 ML 22G X 1 3 ML MISC Use  as directed.   testosterone  cypionate (DEPOTESTOSTERONE CYPIONATE) 200 MG/ML injection INJECT 0.7 MLS (140 MG TOTAL) INTO THE MUSCLE ONCE A WEEK. (DISCARD VIAL AFTER SINGLE USE)   tiZANidine  (ZANAFLEX ) 4 MG tablet Take 4 mg by mouth at bedtime.   valsartan  (DIOVAN ) 80 MG tablet Take 80 mg by mouth at bedtime.   No facility-administered encounter medications on file as of 12/06/2023.    Surgical History: Past Surgical History:  Procedure Laterality Date    ANTERIOR CERVICAL DISCECTOMY     APPENDECTOMY     BACK SURGERY     BACK SURGERY  12/04/2020   COLONOSCOPY  09/2013   COLONOSCOPY N/A 05/09/2022   Procedure: COLONOSCOPY;  Surgeon: Onita Elspeth Sharper, DO;  Location: [facility] ENDOSCOPY;  Service: Gastroenterology;  Laterality: N/A;   elbow surgery     ESOPHAGOGASTRODUODENOSCOPY (EGD) WITH PROPOFOL  N/A 05/09/2022   Procedure: ESOPHAGOGASTRODUODENOSCOPY (EGD) WITH PROPOFOL ;  Surgeon: Onita Elspeth Sharper, DO;  Location: [facility] ENDOSCOPY;  Service: Gastroenterology;  Laterality: N/A;   GIVENS CAPSULE STUDY N/A 05/09/2022   Procedure: GIVENS CAPSULE STUDY;  Surgeon: Onita Elspeth Sharper, DO;  Location: [facility] ENDOSCOPY;  Service: Gastroenterology;  Laterality: N/A;   HEMORRHOID SURGERY     HEMORRHOID SURGERY N/A 05/06/2023   Procedure: HEMORRHOIDECTOMY;  Surgeon: Tye Millet, DO;  Location: [facility] ORS;  Service: General;  Laterality: N/A;   LAPAROSCOPIC APPENDECTOMY N/A 12/04/2017   Procedure: APPENDECTOMY LAPAROSCOPIC;  Surgeon: Nicholaus Selinda Birmingham, MD;  Location: [facility] ORS;  Service: General;  Laterality: N/A;   LIPOMA EXCISION Right    leg   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     SPINAL CORD STIMULATOR IMPLANT     UPPER GI ENDOSCOPY  09/2013   WRIST SURGERY Left 01/14/2021    Medical History: Past Medical History:  Diagnosis Date   Acute anterolateral wall MI [facility])    a.) age of 63 (approx 9); denies LHC/PCI procedure   Acute appendicitis 12/04/2017   Allergy    takes allergy shots   Anxiety    a.) on BZO (alproazolam) PRN   Aortic atherosclerosis    Arthritis    Asthma    Atypical chest pain    Barrett esophagus    Bilateral carotid artery stenosis    BPH (benign prostatic hyperplasia)    Cervicalgia    Chronic pain    CKD (chronic kidney disease) stage 3, GFR 30-59 ml/min (HCC)    Colon polyp    COPD (chronic obstructive pulmonary disease) (HCC)    Coronary artery disease    Depression    Diastolic dysfunction     a.) TTE 06/11/2019: EF >55%, mild LVH, G1DD, mild BAE, mild RVE, triv TR/PR, mild MR   Dieulafoy lesion of colon    Diverticulosis    Former smoker    GERD (gastroesophageal reflux disease)    Hashimoto's thyroiditis    Hemorrhoid    History of kidney stones    Hyperlipidemia    Hypertension    Hypothyroid    IBS (irritable bowel syndrome)    IDA (iron  deficiency anemia)    a.) has required parenteral iron  infusions   Left carpal tunnel syndrome    Male hypogonadism    a.) on exogenous TRT injections (depotestosterone cypionate)   Migraine    Narcolepsy    a.) takes modafinil    Pneumonia    Pre-diabetes    Right rotator cuff tendonitis    Seizures (HCC)    Spinal stenosis of lumbar region    a.) s/p  spinal cord stimulator placement   Status post insertion of spinal cord stimulator     Family History: Family History  Problem Relation Age of Onset   Hypertension Mother    Lung disease Mother    Heart attack Father    Diabetes Father    Liver cancer Sister    Lung cancer Sister    Colon cancer Sister    Uterine cancer Sister     Social History: Social History   Socioeconomic History   Marital status: Married    Spouse name: Not on file   Number of children: Not on file   Years of education: Not on file   Highest education level: Not on file  Occupational History   Not on file  Tobacco Use   Smoking status: Former    Current packs/day: 0.00    Average packs/day: 1 pack/day for 30.0 years (30.0 ttl pk-yrs)    Types: Cigarettes    Start date: 02/23/1975    Quit date: 02/22/2005    Years since quitting: 18.7   Smokeless tobacco: Never  Vaping Use   Vaping status: Never Used  Substance and Sexual Activity   Alcohol use: Yes    Alcohol/week: 0.0 standard drinks of alcohol    Comment: occasionally   Drug use: No    Comment: past   Sexual activity: Yes  Other Topics Concern   Not on file  Social History Narrative   Not on file   Social Drivers of Health    Financial Resource Strain: Low Risk  (02/08/2023)   Received from [facility]   Overall Financial Resource Strain (CARDIA)    Difficulty of Paying Living Expenses: Not very hard  Food Insecurity: Food Insecurity Present (02/08/2023)   Received from [facility]   Hunger Vital Sign    Within the past 12 months, you worried that your food would run out before you got the money to buy more.: Sometimes true    Within the past 12 months, the food you bought just didn't last and you didn't have money to get more.: Never true  Transportation Needs: No Transportation Needs (02/08/2023)   Received from [facility] - Transportation    In the past 12 months, has lack of transportation kept you from medical appointments or from getting medications?: No    Lack of Transportation (Non-Medical): No  Physical Activity: Not on file  Stress: Not on file  Social Connections: Not on file  Intimate Partner Violence: Not At Risk (05/07/2022)   Humiliation, Afraid, Rape, and Kick questionnaire    Fear of Current or Ex-Partner: No    Emotionally Abused: No    Physically Abused: No    Sexually Abused: No    Vital Signs: Blood pressure (!) 164/77, pulse 84, temperature 98.3 F (36.8 C), resp. rate 16, height 6' (1.829 m), weight 226 lb 9.6 oz (102.8 kg), SpO2 97%.  Examination: General Appearance: The patient is well-developed, well-nourished, and in no distress. Skin: Gross inspection of skin unremarkable. Head: normocephalic, no gross deformities. Eyes: no gross deformities noted. ENT: ears appear grossly normal no exudates. Neck: Supple. No thyromegaly. No LAD. Respiratory: no rhonchi noted. Cardiovascular: Normal S1 and S2 without murmur or rub. Extremities: No cyanosis. pulses are equal. Neurologic: Alert and oriented. No involuntary movements.  LABS: Recent Results (from the past 2160 hours)  Testosterone      Status: None    Collection Time: 10/27/23  9:14 AM  Result Value Ref Range   Testosterone  768 264 - 916 ng/dL    Comment: Adult male reference interval is based on a population of healthy nonobese males (BMI <30) between 66 and 60 years old. Travison, et.al. JCEM [id number]. PMID: 71675896.   Hemoglobin and hematocrit, blood     Status: None   Collection Time: 10/27/23  9:14 AM  Result Value Ref Range   Hemoglobin 13.2 13.0 - 17.7 g/dL   Hematocrit 57.2 62.4 - 51.0 %  Pulmonary Function Test     Status: None   Collection Time: 11/02/23  8:16 AM  Result Value Ref Range   FEV1     FVC     FEV1/FVC     TLC     DLCO    Iron  and TIBC     Status: None   Collection Time: 11/08/23 11:27 AM  Result Value Ref Range   Iron  83 45 - 182 ug/dL   TIBC 576 749 - 549 ug/dL   Saturation Ratios 20 17.9 - 39.5 %   UIBC 340 ug/dL    Comment: Performed at [facility], [address] [place], [place] [address]  Ferritin     Status: Abnormal   Collection Time: 11/08/23 11:27 AM  Result Value Ref Range   Ferritin 6 (L) 24 - 336 ng/mL    Comment: Performed at [facility], [address] [place], [place] [address]  CBC with Differential/Platelet     Status: Abnormal   Collection Time: 11/08/23 11:27 AM  Result Value Ref Range   WBC 8.0 4.0 - 10.5 K/uL   RBC 4.94 4.22 - 5.81 MIL/uL   Hemoglobin 12.8 (L) 13.0 - 17.0 g/dL   HCT 59.0 60.9 - 47.9 %   MCV 82.8 80.0 - 100.0 fL   MCH 25.9 (L) 26.0 - 34.0 pg   MCHC 31.3 30.0 - 36.0 g/dL   RDW 83.8 (H) 88.4 - 84.4 %   Platelets 246 150 - 400 K/uL   nRBC 0.0 0.0 - 0.2 %   Neutrophils Relative % 68 %   Neutro Abs 5.4 1.7 - 7.7 K/uL   Lymphocytes Relative 18 %   Lymphs Abs 1.5 0.7 - 4.0 K/uL   Monocytes Relative 11 %   Monocytes Absolute 0.9 0.1 - 1.0 K/uL   Eosinophils Relative 2 %   Eosinophils Absolute 0.1 0.0 - 0.5 K/uL   Basophils Relative 1 %   Basophils Absolute 0.1 0.0 - 0.1 K/uL   Immature Granulocytes 0 %   Abs Immature  Granulocytes 0.02 0.00 - 0.07 K/uL    Comment: Performed at [facility], [address] [place], [place] [address]  Chlamydia/Gonococcus/Trichomonas, NAA     Status: None   Collection Time: 11/29/23  4:26 PM   Specimen: Urine   Urine  Result Value Ref Range   Chlamydia by NAA Negative Negative   Gonococcus by NAA Negative Negative   Trich vag by NAA Negative Negative  STI Profile     Status: None   Collection Time: 12/02/23  2:19 PM  Result Value Ref Range   Hepatitis B Surface Ag Negative Negative   Hep B Surface Ab, Qual Non Reactive     Comment:          Non Reactive: Not immune to HBV infection.           Anti-HBs undetectable or less than 10 mIU/mL.          Reactive: Evidence of HBV  immunity.           Anti-HBs levels greater than 10 mIU/mL.    Hep B Core Total Ab Negative Negative   Rfx to HBc IgM Comment     Comment: Reflex criteria was not met.   Interpretation Comment     Comment:                    HBV Serology Interpretation Chart  -------------------------------------------------------------------  Interpretation             HBsAg  anti-HBs  anti-HBc  anti-HBc IgM  -------------------------------------------------------------------  Key - Analyte present: + Analyte absent: - Test not indicated: TNI  -------------------------------------------------------------------  Susceptible (never  infected and no evidence    -        -         -          TNI  of vaccination)  -------------------------------------------------------------------  Immune due to natural  resolved infection          -        +         +          TNI  -------------------------------------------------------------------  Immune due to vaccination   -        +         -          TNI  -------------------------------------------------------------------  Acute Infection             +        -         +           +  -------------------------------------------------------------------   Chronic  infection           +        -         +           -  -------------------------------------------------------------------  Interpretation unclear*     -        -         +          +/-  -------------------------------------------------------------------  *Multiple possibilities: resolved infection (most common); false-   positive anti-HBc (susceptible); low-level chronic infection;   resolving acute infection.    HCV Ab Non Reactive Non Reactive   RPR Ser Ql Non Reactive Non Reactive   HIV Screen 4th Generation wRfx Non Reactive Non Reactive    Comment: HIV-1/HIV-2 antibodies and HIV-1 p24 antigen were NOT detected. There is no laboratory evidence of HIV infection. HIV Negative   Interpretation:     Status: None   Collection Time: 12/02/23  2:19 PM  Result Value Ref Range   HCV Interp 1: Comment     Comment: Not infected with HCV unless early or acute infection is suspected (which may be delayed in an immunocompromised individual), or other evidence exists to indicate HCV infection.     Radiology: DG Chest 2 View Result Date: 08/12/2023 CLINICAL DATA:  COPD with exacerbation, rule out pneumonia EXAM: CHEST - 2 VIEW COMPARISON:  01/10/2023 FINDINGS: Cardiomediastinal silhouette and pulmonary vasculature are within normal limits. Focal airspace opacity seen in the RIGHT suprahilar lung is new since prior exam. Lungs are otherwise clear. ACDF changes partially visualized in the lower cervical spine. Neurostimulator leads are seen in the midthoracic spine. IMPRESSION: Focal opacity in the RIGHT suprahilar lung may be due to pneumonitis. Follow-up chest radiograph should be obtained  after completion of antibiotic therapy to exclude a developing mass at this site. Electronically Signed   By: Aliene Lloyd M.D.   On: 08/12/2023 12:47    No results found.  No results found.  Assessment and Plan: Patient Active Problem List   Diagnosis Date Noted   Grade II internal hemorrhoids  02/11/2023   Symptomatic anemia 05/07/2022   Upper GI bleed 05/07/2022   Laceration of left index finger without foreign body without damage to nail 02/26/2021   Prediabetes 11/21/2020   Stage 3 chronic kidney disease (HCC) 11/21/2020   History of MI (myocardial infarction) 11/21/2020   Spinal stenosis of lumbar region without neurogenic claudication 11/20/2020   Battery end of life of spinal cord stimulator 09/18/2020   Bilateral carotid artery stenosis 05/18/2019   Stable angina 05/18/2019   Cervical disc disorder at C5-C6 level with myelopathy 08/30/2018   Preoperative clearance 08/30/2018   Generalized anxiety disorder with panic attacks 08/30/2018   History of long-term treatment with high-risk medication 08/30/2018   Narcolepsy due to underlying condition without cataplexy 05/23/2018   Fever 04/05/2018   Acute bronchitis with asthma 04/05/2018   Cough 04/05/2018   Asthma, chronic 04/05/2018   Cutaneous candidiasis 04/05/2018   Nonintractable headache 04/05/2018   Inflammatory polyarthritis (HCC) 07/27/2017   Other fatigue 07/27/2017   Vitamin B12 deficiency 07/27/2017   Impaired fasting glucose 07/27/2017   Attention and concentration deficit 07/27/2017   Left arm numbness 09/09/2016   Rotator cuff tendonitis, right 10/02/2015   Iron  deficiency anemia 09/17/2014   Angiolipoma of skin 02/22/2014   Mechanical complication of nervous system device, implant, and graft 02/09/2013   Neuroma of leg 12/27/2012   Leg mass 11/23/2012   Chest pain 05/01/2012   Incomplete emptying of bladder 01/28/2012   Urinary hesitancy 01/28/2012   Testicular hypofunction 12/22/2011   Peyronie's disease 12/22/2011   Reduced libido 12/22/2011   Low back pain 09/08/2011   COPD (chronic obstructive pulmonary disease) (HCC) 09/07/2011   Dyspnea 09/07/2011   Hashimoto's thyroiditis 08/31/2011   Essential hypertension 10/16/2008   BACK PAIN WITH RADICULOPATHY 06/20/2007   FATIGUE 10/31/2006    BENIGN PROSTATIC HYPERTROPHY 09/22/2005   PROSTATITIS, CHRONIC 09/22/2005   Other specified abnormal findings of blood chemistry 09/22/2005   Hyperlipidemia 04/22/2004   GERD 04/22/2004   CARPAL TUNNEL SYNDROME, BILATERAL 11/23/2003   Lateral epicondylitis 11/23/2003   Anxiety state 09/23/1999   DEPRESSION 02/23/1999   Hypothyroidism 02/23/1991    1. Centrilobular emphysema (HCC) (Primary) He is no longer smoking. Patient had some emphysema noted on CT spiro is preserved. Will monitor  2. Pulmonary nodule Patient will get a Ct chest for follow up due to history of smoking  3. Arthritis of hip, unspecified laterality Will get a RF and sed rate along with ANA   General Counseling: I have discussed the findings of the evaluation and examination with Adriana.  I have also discussed any further diagnostic evaluation thatmay be needed or ordered today. Darel verbalizes understanding of the findings of todays visit. We also reviewed his medications today and discussed drug interactions and side effects including but not limited excessive drowsiness and altered mental states. We also discussed that there is always a risk not just to him but also people around him. he has been encouraged to call the office with any questions or concerns that should arise related to todays visit.  No orders of the defined types were placed in this encounter.    Time spent: 45  I  have personally obtained a history, examined the patient, evaluated laboratory and imaging results, formulated the assessment and plan and placed orders.    Elfreda DELENA Bathe, MD [facility] Pulmonary and Critical Care Sleep medicine

## 2023-12-06 NOTE — Patient Instructions (Signed)

## 2023-12-11 LAB — ANA W/REFLEX IF POSITIVE

## 2023-12-11 LAB — RHEUMATOID FACTOR: Rheumatoid fact SerPl-aCnc: 16.3 [IU]/mL — ABNORMAL HIGH (ref <14.0)

## 2023-12-11 LAB — SEDIMENTATION RATE: Sed Rate: 2 mm/h (ref 0–30)

## 2023-12-14 ENCOUNTER — Encounter: Payer: Self-pay | Admitting: Nurse Practitioner

## 2023-12-21 ENCOUNTER — Other Ambulatory Visit: Payer: Self-pay | Admitting: Medical Genetics

## 2023-12-21 DIAGNOSIS — Z006 Encounter for examination for normal comparison and control in clinical research program: Secondary | ICD-10-CM

## 2023-12-27 ENCOUNTER — Encounter: Payer: Self-pay | Admitting: Nurse Practitioner

## 2023-12-27 ENCOUNTER — Telehealth: Payer: Self-pay | Admitting: Nurse Practitioner

## 2023-12-27 DIAGNOSIS — G8929 Other chronic pain: Secondary | ICD-10-CM | POA: Insufficient documentation

## 2023-12-27 NOTE — Telephone Encounter (Signed)
 Orthopedic referral sent via [facility] to [facility]. Notified patient. Gave patient telephone # [phone]

## 2024-01-04 ENCOUNTER — Telehealth: Payer: Self-pay

## 2024-01-04 ENCOUNTER — Other Ambulatory Visit: Payer: Self-pay

## 2024-01-04 ENCOUNTER — Telehealth: Payer: Self-pay | Admitting: Nurse Practitioner

## 2024-01-04 MED ORDER — LEVOFLOXACIN 500 MG PO TABS: 500.0000 mg | ORAL_TABLET | Freq: Every day | ORAL | 0 refills | Status: DC

## 2024-01-04 NOTE — Telephone Encounter (Signed)
 Pt called that he is having fever 99.5 to 101 and coughing up mucus and congestion and green mucus and covid test is negative offer him appt he is unable to drive  as per dr fernand send Levaquin  and pt had cough med advised him if not feeling better need appt or go to Urgent care

## 2024-01-04 NOTE — Telephone Encounter (Signed)
 Per Niels reinhold Gaba, referral has been closed due to patient not returning calls -Andree

## 2024-01-05 ENCOUNTER — Telehealth: Payer: Self-pay

## 2024-01-05 NOTE — Telephone Encounter (Signed)
 Notified patient about recent recall on Bupropion  24XL 300mg  tabs. Patient will coordinate with pharmacy regarding new refill.

## 2024-01-10 ENCOUNTER — Telehealth: Payer: Self-pay | Admitting: Nurse Practitioner

## 2024-01-10 NOTE — Telephone Encounter (Signed)
 Left vm and sent mychart message to confirm 01/17/24 appointment-[name]

## 2024-01-17 ENCOUNTER — Encounter: Payer: Self-pay | Admitting: Nurse Practitioner

## 2024-01-17 ENCOUNTER — Ambulatory Visit (INDEPENDENT_AMBULATORY_CARE_PROVIDER_SITE_OTHER): Payer: No Typology Code available for payment source | Admitting: Nurse Practitioner

## 2024-01-17 VITALS — BP 124/80 | HR 86 | Temp 96.0°F | Resp 16 | Ht 72.0 in | Wt 215.6 lb

## 2024-01-17 DIAGNOSIS — Z833 Family history of diabetes mellitus: Secondary | ICD-10-CM | POA: Diagnosis not present

## 2024-01-17 DIAGNOSIS — I10 Essential (primary) hypertension: Secondary | ICD-10-CM | POA: Diagnosis not present

## 2024-01-17 DIAGNOSIS — E039 Hypothyroidism, unspecified: Secondary | ICD-10-CM | POA: Diagnosis not present

## 2024-01-17 DIAGNOSIS — D649 Anemia, unspecified: Secondary | ICD-10-CM | POA: Diagnosis not present

## 2024-01-17 DIAGNOSIS — E538 Deficiency of other specified B group vitamins: Secondary | ICD-10-CM | POA: Diagnosis not present

## 2024-01-17 DIAGNOSIS — Z0001 Encounter for general adult medical examination with abnormal findings: Secondary | ICD-10-CM

## 2024-01-17 DIAGNOSIS — E782 Mixed hyperlipidemia: Secondary | ICD-10-CM

## 2024-01-17 DIAGNOSIS — Z23 Encounter for immunization: Secondary | ICD-10-CM

## 2024-01-17 DIAGNOSIS — J432 Centrilobular emphysema: Secondary | ICD-10-CM

## 2024-01-17 DIAGNOSIS — E559 Vitamin D deficiency, unspecified: Secondary | ICD-10-CM

## 2024-01-17 MED ORDER — PNEUMOCOCCAL 20-VAL CONJ VACC 0.5 ML IM SUSY: 0.5000 mL | PREFILLED_SYRINGE | Freq: Once | INTRAMUSCULAR | 0 refills | Status: AC | PRN

## 2024-01-17 NOTE — Progress Notes (Signed)
 [facility] [address] [place], [place] [address]  Internal MEDICINE  Office Visit Note  Patient Name: Randy Mclean  [id number]  [id number]  Date of Service: 01/17/2024  Chief Complaint  Patient presents with   Depression   Gastroesophageal Reflux   Hyperlipidemia   Hypertension   Annual Exam    HPI Samit presents for an annual well visit and physical exam.  Well-appearing 63 y.o. male with hypertension, narcolepsy, hypothyroidism, GERD, COPD, asthma, angina pectoris, bilateral carotid stenosis, carpal tunnel syndrome, chronic back pain, inflammatory polyarthritis, BPH, anxiety and depression.  lives at home with his husband and works full time.  --not due for routine screening colonoscopy now, is followed by GI for recurring GI bleed Routine CRC screening: colonoscopy done in 2024, due to repeat in 2027 Last PSA level is normal.  -Followed by hematology for anemia. He has a family history of type 2 diabetes. He also has a family history of small cell lung cancer in his sister who passed away last year.  Labs: routine labs due now  New or worsening pain: chronic right hip and right ankle/heel Other concerns: due for pneumonia vaccine which is recommended esp due to history of copd.     Current Medication: Outpatient Encounter Medications as of 01/17/2024  Medication Sig   pneumococcal 20-valent conjugate vaccine (PREVNAR 20) 0.5 ML injection Inject 0.5 mLs into the muscle once as needed for up to 1 dose for immunization.   alfuzosin  (UROXATRAL ) 10 MG 24 hr tablet TAKE 1 TABLET AT BEDTIME   ALPRAZolam  (XANAX ) 0.5 MG tablet Take 1 tablet (0.5 mg total) by mouth 3 (three) times daily as needed for anxiety.   amLODipine  (NORVASC ) 10 MG tablet Take 1 tablet (10 mg total) by mouth daily.   atorvastatin  (LIPITOR) 10 MG tablet Take 1 tablet (10 mg total) by mouth daily.   benzonatate  (TESSALON ) 200 MG capsule Take 1 capsule (200 mg total) by mouth 2 (two)  times daily as needed for cough.   budesonide-glycopyrrolate-formoterol  (BREZTRI  AEROSPHERE) 160-9-4.8 MCG/ACT AERO inhaler Inhale 2 puffs into the lungs in the morning and at bedtime.   buPROPion  (WELLBUTRIN  XL) 300 MG 24 hr tablet Take 1 tablet (300 mg total) by mouth daily.   esomeprazole  (NEXIUM ) 40 MG capsule Take 1 capsule (40 mg total) by mouth 2 (two) times daily before a meal.   fexofenadine (ALLEGRA) 180 MG tablet Take 180 mg by mouth 2 (two) times daily.   FIBER GUMMIES PO Take 2 tablets by mouth daily.   hydrochlorothiazide  (HYDRODIURIL ) 12.5 MG tablet Take 1 tablet (12.5 mg total) by mouth daily.   ipratropium-albuterol  (DUONEB) 0.5-2.5 (3) MG/3ML SOLN Take 3 mLs by nebulization every 6 (six) hours as needed (SOB/wheezing/cough). (Patient not taking: Reported on 01/17/2024)   levofloxacin  (LEVAQUIN ) 500 MG tablet Take 1 tablet (500 mg total) by mouth daily.   levothyroxine  (SYNTHROID ) 150 MCG tablet Take 1 tablet (150 mcg total) by mouth daily before breakfast.   methocarbamol  (ROBAXIN ) 750 MG tablet Take 750 mg by mouth 4 (four) times daily as needed for muscle spasms.   metoprolol  succinate (TOPROL -XL) 25 MG 24 hr tablet Take 25 mg by mouth at bedtime.   modafinil  (PROVIGIL ) 200 MG tablet Take 1 tablet (200 mg total) by mouth daily.   Multiple Vitamin (MULTI-VITAMINS) TABS Take 1 tablet by mouth daily.   oxyCODONE -acetaminophen  (PERCOCET/ROXICET) 5-325 MG tablet Take 1 tablet by mouth every 6 (six) hours as needed.   pregabalin  (LYRICA ) 100 MG  capsule Take 1 capsule (100 mg total) by mouth every 8 (eight) hours.   senna (SENOKOT) 8.6 MG TABS tablet Take 1 tablet by mouth at bedtime.   SYRINGE-NEEDLE, DISP, 3 ML 22G X 1 3 ML MISC Use as directed.   testosterone  cypionate (DEPOTESTOSTERONE CYPIONATE) 200 MG/ML injection INJECT 0.7 MLS (140 MG TOTAL) INTO THE MUSCLE ONCE A WEEK. (DISCARD VIAL AFTER SINGLE USE)   tiZANidine  (ZANAFLEX ) 4 MG tablet Take 4 mg by mouth at bedtime.    valsartan  (DIOVAN ) 80 MG tablet Take 80 mg by mouth at bedtime.   No facility-administered encounter medications on file as of 01/17/2024.    Surgical History: Past Surgical History:  Procedure Laterality Date   ANTERIOR CERVICAL DISCECTOMY     APPENDECTOMY     BACK SURGERY     BACK SURGERY  12/04/2020   COLONOSCOPY  09/2013   COLONOSCOPY N/A 05/09/2022   Procedure: COLONOSCOPY;  Surgeon: Onita Elspeth Sharper, DO;  Location[facility] ENDOSCOPY;  Service: Gastroenterology;  Laterality: N/A;   elbow surgery     ESOPHAGOGASTRODUODENOSCOPY (EGD) WITH PROPOFOL  N/A 05/09/2022   Procedure: ESOPHAGOGASTRODUODENOSCOPY (EGD) WITH PROPOFOL ;  Surgeon: Onita Elspeth Sharper, DO;  Location: [facility] ENDOSCOPY;  Service: Gastroenterology;  Laterality: N/A;   GIVENS CAPSULE STUDY N/A 05/09/2022   Procedure: GIVENS CAPSULE STUDY;  Surgeon: Onita Elspeth Sharper, DO;  Location: [facility] ENDOSCOPY;  Service: Gastroenterology;  Laterality: N/A;   HEMORRHOID SURGERY     HEMORRHOID SURGERY N/A 05/06/2023   Procedure: HEMORRHOIDECTOMY;  Surgeon: Tye Millet, DO;  Location: [facility] ORS;  Service: General;  Laterality: N/A;   LAPAROSCOPIC APPENDECTOMY N/A 12/04/2017   Procedure: APPENDECTOMY LAPAROSCOPIC;  Surgeon: Nicholaus Selinda Birmingham, MD;  Location: [facility] ORS;  Service: General;  Laterality: N/A;   LIPOMA EXCISION Right    leg   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     SPINAL CORD STIMULATOR IMPLANT     UPPER GI ENDOSCOPY  09/2013   WRIST SURGERY Left 01/14/2021    Medical History: Past Medical History:  Diagnosis Date   Acute anterolateral wall MI [facility])    a.) age of 25 (approx 42); denies LHC/PCI procedure   Acute appendicitis 12/04/2017   Allergy    takes allergy shots   Anxiety    a.) on BZO (alproazolam) PRN   Aortic atherosclerosis    Arthritis    Asthma    Atypical chest pain    Barrett esophagus    Bilateral carotid artery stenosis    BPH (benign prostatic hyperplasia)    Cervicalgia     Chronic pain    CKD (chronic kidney disease) stage 3, GFR 30-59 ml/min (HCC)    Colon polyp    COPD (chronic obstructive pulmonary disease) (HCC)    Coronary artery disease    Depression    Diastolic dysfunction    a.) TTE 06/11/2019: EF >55%, mild LVH, G1DD, mild BAE, mild RVE, triv TR/PR, mild MR   Dieulafoy lesion of colon    Diverticulosis    Former smoker    GERD (gastroesophageal reflux disease)    Hashimoto's thyroiditis    Hemorrhoid    History of kidney stones    Hyperlipidemia    Hypertension    Hypothyroid    IBS (irritable bowel syndrome)    IDA (iron  deficiency anemia)    a.) has required parenteral iron  infusions   Left carpal tunnel syndrome    Male hypogonadism    a.) on exogenous TRT injections (depotestosterone cypionate)   Migraine  Narcolepsy    a.) takes modafinil    Pneumonia    Pre-diabetes    Right rotator cuff tendonitis    Seizures (HCC)    Spinal stenosis of lumbar region    a.) s/p spinal cord stimulator placement   Status post insertion of spinal cord stimulator     Family History: Family History  Problem Relation Age of Onset   Hypertension Mother    Lung disease Mother    Heart attack Father    Diabetes Father    Liver cancer Sister    Lung cancer Sister    Colon cancer Sister    Uterine cancer Sister     Social History   Socioeconomic History   Marital status: Married    Spouse name: Not on file   Number of children: Not on file   Years of education: Not on file   Highest education level: Not on file  Occupational History   Not on file  Tobacco Use   Smoking status: Former    Current packs/day: 0.00    Average packs/day: 1 pack/day for 30.0 years (30.0 ttl pk-yrs)    Types: Cigarettes    Start date: 02/23/1975    Quit date: 02/22/2005    Years since quitting: 18.9   Smokeless tobacco: Never  Vaping Use   Vaping status: Never Used  Substance and Sexual Activity   Alcohol use: Yes    Alcohol/week: 0.0 standard drinks  of alcohol    Comment: occasionally   Drug use: No    Comment: past   Sexual activity: Yes  Other Topics Concern   Not on file  Social History Narrative   Not on file   Social Drivers of Health   Financial Resource Strain: Low Risk  (02/08/2023)   Received from [facility]   Overall Financial Resource Strain (CARDIA)    Difficulty of Paying Living Expenses: Not very hard  Food Insecurity: Food Insecurity Present (02/08/2023)   Received from [facility]   Hunger Vital Sign    Within the past 12 months, you worried that your food would run out before you got the money to buy more.: Sometimes true    Within the past 12 months, the food you bought just didn't last and you didn't have money to get more.: Never true  Transportation Needs: No Transportation Needs (02/08/2023)   Received from [facility] - Transportation    In the past 12 months, has lack of transportation kept you from medical appointments or from getting medications?: No    Lack of Transportation (Non-Medical): No  Physical Activity: Not on file  Stress: Not on file  Social Connections: Not on file  Intimate Partner Violence: Not At Risk (05/07/2022)   Humiliation, Afraid, Rape, and Kick questionnaire    Fear of Current or Ex-Partner: No    Emotionally Abused: No    Physically Abused: No    Sexually Abused: No      Review of Systems  Constitutional:  Positive for appetite change and fatigue. Negative for activity change, chills, fever and unexpected weight change.  HENT:  Positive for congestion and voice change. Negative for ear pain, rhinorrhea, sore throat and trouble swallowing.   Eyes: Negative.   Respiratory:  Positive for cough, chest tightness, shortness of breath and wheezing.   Cardiovascular: Negative.  Negative for chest pain and palpitations.  Gastrointestinal: Negative.  Negative for abdominal pain, blood in stool, constipation,  diarrhea,  nausea and vomiting.  Endocrine: Negative.   Genitourinary: Negative.  Negative for difficulty urinating, dysuria, frequency, hematuria and urgency.  Musculoskeletal:  Positive for arthralgias. Negative for back pain, joint swelling, myalgias and neck pain.  Skin: Negative.  Negative for rash and wound.  Allergic/Immunologic: Negative.  Negative for immunocompromised state.  Neurological: Negative.  Negative for dizziness, seizures, numbness and headaches.  Hematological: Negative.   Psychiatric/Behavioral:  Negative for behavioral problems, self-injury and suicidal ideas. The patient is not nervous/anxious.     Vital Signs: BP 124/80   Pulse 86   Temp (!) 96 F (35.6 C)   Resp 16   Ht 6' (1.829 m)   Wt 215 lb 9.6 oz (97.8 kg)   SpO2 96%   BMI 29.24 kg/m    Physical Exam Vitals reviewed.  Constitutional:      General: He is awake. He is not in acute distress.    Appearance: Normal appearance. He is well-developed, well-groomed and overweight. He is not ill-appearing or diaphoretic.  HENT:     Head: Normocephalic and atraumatic.     Right Ear: Tympanic membrane, ear canal and external ear normal.     Left Ear: Tympanic membrane, ear canal and external ear normal.     Nose: Nose normal. No congestion or rhinorrhea.     Mouth/Throat:     Lips: Pink.     Mouth: Mucous membranes are moist.     Pharynx: Oropharynx is clear. Uvula midline. No oropharyngeal exudate or posterior oropharyngeal erythema.  Eyes:     General: Lids are normal. Vision grossly intact. Gaze aligned appropriately. No scleral icterus.       Right eye: No discharge.        Left eye: No discharge.     Extraocular Movements: Extraocular movements intact.     Conjunctiva/sclera: Conjunctivae normal.     Pupils: Pupils are equal, round, and reactive to light.     Funduscopic exam:    Right eye: Red reflex present.        Left eye: Red reflex present. Neck:     Thyroid : No thyromegaly.      Vascular: No carotid bruit or JVD.     Trachea: Trachea and phonation normal. No tracheal deviation.  Cardiovascular:     Rate and Rhythm: Normal rate and regular rhythm.     Pulses:          Carotid pulses are 3+ on the right side and 3+ on the left side.      Radial pulses are 2+ on the right side and 2+ on the left side.       Dorsalis pedis pulses are 2+ on the right side and 2+ on the left side.       Posterior tibial pulses are 2+ on the right side and 2+ on the left side.     Heart sounds: Normal heart sounds and S1 normal. No murmur heard.    No friction rub. No gallop.  Pulmonary:     Effort: Pulmonary effort is normal. No accessory muscle usage or respiratory distress.     Breath sounds: Normal breath sounds and air entry. No stridor. No wheezing or rales.  Chest:     Chest wall: No tenderness.  Abdominal:     General: Bowel sounds are normal. There is no distension.     Palpations: Abdomen is soft. There is no shifting dullness, fluid wave, mass or pulsatile mass.     Tenderness: There is no  abdominal tenderness. There is no guarding or rebound.  Musculoskeletal:        General: No tenderness or deformity. Normal range of motion.     Cervical back: Normal range of motion and neck supple.     Right lower leg: No edema.     Left lower leg: No edema.  Lymphadenopathy:     Cervical: No cervical adenopathy.  Skin:    General: Skin is warm and dry.     Capillary Refill: Capillary refill takes less than 2 seconds.     Coloration: Skin is not pale.     Findings: No erythema or rash.  Neurological:     Mental Status: He is alert and oriented to person, place, and time.     Cranial Nerves: No cranial nerve deficit.     Motor: No abnormal muscle tone.     Coordination: Coordination normal.     Gait: Gait normal.     Deep Tendon Reflexes: Reflexes are normal and symmetric.  Psychiatric:        Mood and Affect: Mood and affect normal.        Behavior: Behavior normal. Behavior  is cooperative.        Thought Content: Thought content normal.        Judgment: Judgment normal.        Assessment/Plan: 1. Encounter for routine adult health examination with abnormal findings (Primary) Age-appropriate preventive screenings and vaccinations discussed, annual physical exam completed. Routine labs for health maintenance ordered, see below. PHM updated.   - Lipid Profile - Hgb A1C w/o eAG - TSH + free T4 - B12 and Folate Panel - Vitamin D  (25 hydroxy) - CMP14+EGFR  2. Centrilobular emphysema (HCC) Continue breztri  inhaler as prescribed and continue routine follow up with pulmonology with Dr. Elfreda Bathe.   3. Symptomatic anemia Followed by hematology and routine lab ordered  - B12 and Folate Panel  4. Essential hypertension Routine labs ordered. Stable, continue medications as prescribed.  - Lipid Profile - CMP14+EGFR  5. Acquired hypothyroidism Routine labs ordered. Continue levothyroxine  as prescribed.  - Lipid Profile - Hgb A1C w/o eAG - TSH + free T4 - Vitamin D  (25 hydroxy)  6. Mixed hyperlipidemia Routine labs ordered  - Lipid Profile - Hgb A1C w/o eAG - TSH + free T4 - CMP14+EGFR  7. B12 deficiency Routine lab ordered  - B12 and Folate Panel  8. Vitamin D  deficiency Routine lab ordered  - Vitamin D  (25 hydroxy)  9. Family history of type 2 diabetes mellitus Routine labs ordered  - Lipid Profile - Hgb A1C w/o eAG - CMP14+EGFR  10. Need for vaccination - pneumococcal 20-valent conjugate vaccine (PREVNAR 20) 0.5 ML injection; Inject 0.5 mLs into the muscle once as needed for up to 1 dose for immunization.  Dispense: 0.5 mL; Refill: 0      General Counseling: Cohen verbalizes understanding of the findings of todays visit and agrees with plan of treatment. I have discussed any further diagnostic evaluation that may be needed or ordered today. We also reviewed his medications today. he has been encouraged to call the office with any  questions or concerns that should arise related to todays visit.    Orders Placed This Encounter  Procedures   Lipid Profile   Hgb A1C w/o eAG   TSH + free T4   B12 and Folate Panel   Vitamin D  (25 hydroxy)   CMP14+EGFR    Meds ordered this encounter  Medications  pneumococcal 20-valent conjugate vaccine (PREVNAR 20) 0.5 ML injection    Sig: Inject 0.5 mLs into the muscle once as needed for up to 1 dose for immunization.    Dispense:  0.5 mL    Refill:  0    Due for prevnar 20    Return for F/U, Labs, Diva Lemberger PCP in 1-3 weeks .   Total time spent:30 Minutes Time spent includes review of chart, medications, test results, and follow up plan with the patient.   [place] Controlled Substance Database was reviewed by me.  This patient was seen by Mardy Maxin, FNP-C in collaboration with Dr. Sigrid Bathe as a part of collaborative care agreement.  Tashan Kreitzer R. Maxin, MSN, FNP-C Internal medicine

## 2024-01-18 LAB — CMP14+EGFR
ALT: 15 IU/L (ref 0–44)
AST: 22 IU/L (ref 0–40)
Albumin: 4.6 g/dL (ref 3.8–4.9)
Alkaline Phosphatase: 101 IU/L (ref 47–123)
BUN/Creatinine Ratio: 12 (ref 10–24)
BUN: 14 mg/dL (ref 8–27)
Bilirubin Total: 0.5 mg/dL (ref 0.0–1.2)
CO2: 26 mmol/L (ref 20–29)
Calcium: 9.4 mg/dL (ref 8.6–10.2)
Chloride: 98 mmol/L (ref 96–106)
Creatinine, Ser: 1.21 mg/dL (ref 0.76–1.27)
Globulin, Total: 2.4 g/dL (ref 1.5–4.5)
Glucose: 96 mg/dL (ref 70–99)
Potassium: 4.1 mmol/L (ref 3.5–5.2)
Sodium: 140 mmol/L (ref 134–144)
Total Protein: 7 g/dL (ref 6.0–8.5)
eGFR: 69 mL/min/1.73 (ref >59)

## 2024-01-18 LAB — LIPID PANEL
Chol/HDL Ratio: 3.3 ratio (ref 0.0–5.0)
Cholesterol, Total: 165 mg/dL (ref 100–199)
HDL: 50 mg/dL (ref >39)
LDL Chol Calc (NIH): 98 mg/dL (ref 0–99)
Triglycerides: 91 mg/dL (ref 0–149)
VLDL Cholesterol Cal: 17 mg/dL (ref 5–40)

## 2024-01-18 LAB — B12 AND FOLATE PANEL
Folate: 11.6 ng/mL (ref >3.0)
Vitamin B-12: 657 pg/mL (ref 232–1245)

## 2024-01-18 LAB — TSH+FREE T4
Free T4: 1.46 ng/dL (ref 0.82–1.77)
TSH: 1 u[IU]/mL (ref 0.450–4.500)

## 2024-01-18 LAB — VITAMIN D 25 HYDROXY (VIT D DEFICIENCY, FRACTURES): Vit D, 25-Hydroxy: 47.4 ng/mL (ref 30.0–100.0)

## 2024-01-18 LAB — HGB A1C W/O EAG: Hgb A1c MFr Bld: 5.5 % (ref 4.8–5.6)

## 2024-01-23 ENCOUNTER — Telehealth: Payer: Self-pay | Admitting: Internal Medicine

## 2024-01-23 NOTE — Telephone Encounter (Signed)
 Notified patient of CT appointment date, arrival time, location and to bring stimulator remote-[name]

## 2024-01-25 ENCOUNTER — Ambulatory Visit: Admission: RE | Admit: 2024-01-25 | Discharge: 2024-01-25 | Attending: Internal Medicine | Admitting: Internal Medicine

## 2024-01-25 DIAGNOSIS — R911 Solitary pulmonary nodule: Secondary | ICD-10-CM | POA: Insufficient documentation

## 2024-02-02 ENCOUNTER — Other Ambulatory Visit: Payer: Self-pay | Admitting: Urology

## 2024-02-06 ENCOUNTER — Telehealth: Payer: Self-pay | Admitting: Oncology

## 2024-02-06 NOTE — Telephone Encounter (Signed)
 I called the pt and left a vm with the new appt date/time.

## 2024-02-07 ENCOUNTER — Encounter: Payer: Self-pay | Admitting: Nurse Practitioner

## 2024-02-07 ENCOUNTER — Telehealth: Admitting: Nurse Practitioner

## 2024-02-07 VITALS — Resp 16 | Ht 72.0 in | Wt 207.0 lb

## 2024-02-07 DIAGNOSIS — Z558 Other problems related to education and literacy: Secondary | ICD-10-CM

## 2024-02-07 DIAGNOSIS — J9811 Atelectasis: Secondary | ICD-10-CM

## 2024-02-07 DIAGNOSIS — I700 Atherosclerosis of aorta: Secondary | ICD-10-CM | POA: Insufficient documentation

## 2024-02-07 DIAGNOSIS — J432 Centrilobular emphysema: Secondary | ICD-10-CM

## 2024-02-07 NOTE — Progress Notes (Signed)
 [facility] [address] [place], [place] [address]  Internal MEDICINE  Telephone Visit  Patient Name: Randy Mclean Younger  [id number]  [id number]  Date of Service: 02/07/2024  I connected with the patient at 1620 by telephone and verified the patients identity using two identifiers.   I discussed the limitations, risks, security and privacy concerns of performing an evaluation and management service by telephone and the availability of in person appointments. I also discussed with the patient that there may be a patient responsible charge related to the service.  The patient expressed understanding and agrees to proceed.    Chief Complaint  Patient presents with   Telephone Screen    Review labs   Telephone Assessment    HPI Randy Mclean presents for a telehealth virtual visit for lab results and CT chest results. Patient is out of town and will not be back in town for his office visit with Dr. Elfreda Bathe on 02/14/24 next week so we will discuss his CT scan today.  All labs are normal Calcium  level returned to normal range, was previously low LDL decreased to normal range from 113 A1c is normal at 5.5 CT chest results: Mild paraseptal and centrilobular emphysema in the upper lobes , opacities in right lower lobe, most likely dependent atelectasis, and aortic atherosclerosis   Current Medication: Outpatient Encounter Medications as of 02/07/2024  Medication Sig   alfuzosin  (UROXATRAL ) 10 MG 24 hr tablet TAKE 1 TABLET AT BEDTIME   ALPRAZolam  (XANAX ) 0.5 MG tablet Take 1 tablet (0.5 mg total) by mouth 3 (three) times daily as needed for anxiety.   amLODipine  (NORVASC ) 10 MG tablet Take 1 tablet (10 mg total) by mouth daily.   atorvastatin  (LIPITOR) 10 MG tablet Take 1 tablet (10 mg total) by mouth daily.   benzonatate  (TESSALON ) 200 MG capsule Take 1 capsule (200 mg total) by mouth 2 (two) times daily as needed for cough.   budesonide-glycopyrrolate-formoterol  (BREZTRI   AEROSPHERE) 160-9-4.8 MCG/ACT AERO inhaler Inhale 2 puffs into the lungs in the morning and at bedtime.   buPROPion  (WELLBUTRIN  XL) 300 MG 24 hr tablet Take 1 tablet (300 mg total) by mouth daily.   esomeprazole  (NEXIUM ) 40 MG capsule Take 1 capsule (40 mg total) by mouth 2 (two) times daily before a meal.   fexofenadine (ALLEGRA) 180 MG tablet Take 180 mg by mouth 2 (two) times daily.   FIBER GUMMIES PO Take 2 tablets by mouth daily.   hydrochlorothiazide  (HYDRODIURIL ) 12.5 MG tablet Take 1 tablet (12.5 mg total) by mouth daily.   ipratropium-albuterol  (DUONEB) 0.5-2.5 (3) MG/3ML SOLN Take 3 mLs by nebulization every 6 (six) hours as needed (SOB/wheezing/cough). (Patient not taking: Reported on 01/17/2024)   levofloxacin  (LEVAQUIN ) 500 MG tablet Take 1 tablet (500 mg total) by mouth daily.   levothyroxine  (SYNTHROID ) 150 MCG tablet Take 1 tablet (150 mcg total) by mouth daily before breakfast.   methocarbamol  (ROBAXIN ) 750 MG tablet Take 750 mg by mouth 4 (four) times daily as needed for muscle spasms.   metoprolol  succinate (TOPROL -XL) 25 MG 24 hr tablet Take 25 mg by mouth at bedtime.   modafinil  (PROVIGIL ) 200 MG tablet Take 1 tablet (200 mg total) by mouth daily.   Multiple Vitamin (MULTI-VITAMINS) TABS Take 1 tablet by mouth daily.   oxyCODONE -acetaminophen  (PERCOCET/ROXICET) 5-325 MG tablet Take 1 tablet by mouth every 6 (six) hours as needed.   pneumococcal 20-valent conjugate vaccine (PREVNAR 20) 0.5 ML injection Inject 0.5 mLs into the muscle once  as needed for up to 1 dose for immunization.   pregabalin  (LYRICA ) 100 MG capsule Take 1 capsule (100 mg total) by mouth every 8 (eight) hours.   senna (SENOKOT) 8.6 MG TABS tablet Take 1 tablet by mouth at bedtime.   SYRINGE-NEEDLE, DISP, 3 ML 22G X 1 3 ML MISC Use as directed.   testosterone  cypionate (DEPOTESTOSTERONE CYPIONATE) 200 MG/ML injection INJECT 0.7 MLS (140 MG TOTAL) INTO THE MUSCLE ONCE A WEEK. (DISCARD VIAL AFTER SINGLE USE)    tiZANidine  (ZANAFLEX ) 4 MG tablet Take 4 mg by mouth at bedtime.   valsartan  (DIOVAN ) 80 MG tablet Take 80 mg by mouth at bedtime.   No facility-administered encounter medications on file as of 02/07/2024.    Surgical History: Past Surgical History:  Procedure Laterality Date   ANTERIOR CERVICAL DISCECTOMY     APPENDECTOMY     BACK SURGERY     BACK SURGERY  12/04/2020   COLONOSCOPY  09/2013   COLONOSCOPY N/A 05/09/2022   Procedure: COLONOSCOPY;  Surgeon: Onita Elspeth Sharper, DO;  Location: [facility] ENDOSCOPY;  Service: Gastroenterology;  Laterality: N/A;   elbow surgery     ESOPHAGOGASTRODUODENOSCOPY (EGD) WITH PROPOFOL  N/A 05/09/2022   Procedure: ESOPHAGOGASTRODUODENOSCOPY (EGD) WITH PROPOFOL ;  Surgeon: Onita Elspeth Sharper, DO;  Location: [facility] ENDOSCOPY;  Service: Gastroenterology;  Laterality: N/A;   GIVENS CAPSULE STUDY N/A 05/09/2022   Procedure: GIVENS CAPSULE STUDY;  Surgeon: Onita Elspeth Sharper, DO;  Location[facility] ENDOSCOPY;  Service: Gastroenterology;  Laterality: N/A;   HEMORRHOID SURGERY     HEMORRHOID SURGERY N/A 05/06/2023   Procedure: HEMORRHOIDECTOMY;  Surgeon: Tye Millet, DO;  Location: [facility] ORS;  Service: General;  Laterality: N/A;   LAPAROSCOPIC APPENDECTOMY N/A 12/04/2017   Procedure: APPENDECTOMY LAPAROSCOPIC;  Surgeon: Nicholaus Selinda Birmingham, MD;  Location: [facility] ORS;  Service: General;  Laterality: N/A;   LIPOMA EXCISION Right    leg   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     SPINAL CORD STIMULATOR IMPLANT     UPPER GI ENDOSCOPY  09/2013   WRIST SURGERY Left 01/14/2021    Medical History: Past Medical History:  Diagnosis Date   Acute anterolateral wall MI [facility])    a.) age of 89 (approx 33); denies LHC/PCI procedure   Acute appendicitis 12/04/2017   Allergy    takes allergy shots   Anxiety    a.) on BZO (alproazolam) PRN   Aortic atherosclerosis    Arthritis    Asthma    Atypical chest pain    Barrett esophagus    Bilateral carotid artery  stenosis    BPH (benign prostatic hyperplasia)    Cervicalgia    Chronic pain    CKD (chronic kidney disease) stage 3, GFR 30-59 ml/min (HCC)    Colon polyp    COPD (chronic obstructive pulmonary disease) (HCC)    Coronary artery disease    Depression    Diastolic dysfunction    a.) TTE 06/11/2019: EF >55%, mild LVH, G1DD, mild BAE, mild RVE, triv TR/PR, mild MR   Dieulafoy lesion of colon    Diverticulosis    Former smoker    GERD (gastroesophageal reflux disease)    Hashimoto's thyroiditis    Hemorrhoid    History of kidney stones    Hyperlipidemia    Hypertension    Hypothyroid    IBS (irritable bowel syndrome)    IDA (iron  deficiency anemia)    a.) has required parenteral iron  infusions   Left carpal tunnel syndrome    Male  hypogonadism    a.) on exogenous TRT injections (depotestosterone cypionate)   Migraine    Narcolepsy    a.) takes modafinil    Pneumonia    Pre-diabetes    Right rotator cuff tendonitis    Seizures (HCC)    Spinal stenosis of lumbar region    a.) s/p spinal cord stimulator placement   Status post insertion of spinal cord stimulator     Family History: Family History  Problem Relation Age of Onset   Hypertension Mother    Lung disease Mother    Heart attack Father    Diabetes Father    Liver cancer Sister    Lung cancer Sister    Colon cancer Sister    Uterine cancer Sister     Social History   Socioeconomic History   Marital status: Married    Spouse name: Not on file   Number of children: Not on file   Years of education: Not on file   Highest education level: Not on file  Occupational History   Not on file  Tobacco Use   Smoking status: Former    Current packs/day: 0.00    Average packs/day: 1 pack/day for 30.0 years (30.0 ttl pk-yrs)    Types: Cigarettes    Start date: 02/23/1975    Quit date: 02/22/2005    Years since quitting: 18.9   Smokeless tobacco: Never  Vaping Use   Vaping status: Never Used  Substance and Sexual  Activity   Alcohol use: Yes    Alcohol/week: 0.0 standard drinks of alcohol    Comment: occasionally   Drug use: No    Comment: past   Sexual activity: Yes  Other Topics Concern   Not on file  Social History Narrative   Not on file   Social Drivers of Health   Tobacco Use: Medium Risk (02/07/2024)   Patient History    Smoking Tobacco Use: Former    Smokeless Tobacco Use: Never    Passive Exposure: Not on file  Financial Resource Strain: Low Risk  (02/08/2023)   Received from [facility]   Overall Financial Resource Strain (CARDIA)    Difficulty of Paying Living Expenses: Not very hard  Food Insecurity: Food Insecurity Present (02/08/2023)   Received from [facility]   Epic    Within the past 12 months, you worried that your food would run out before you got the money to buy more.: Sometimes true    Within the past 12 months, the food you bought just didn't last and you didn't have money to get more.: Never true  Transportation Needs: No Transportation Needs (02/08/2023)   Received from [facility] - Transportation    In the past 12 months, has lack of transportation kept you from medical appointments or from getting medications?: No    Lack of Transportation (Non-Medical): No  Physical Activity: Not on file  Stress: Not on file  Social Connections: Not on file  Intimate Partner Violence: Not At Risk (05/07/2022)   Humiliation, Afraid, Rape, and Kick questionnaire    Fear of Current or Ex-Partner: No    Emotionally Abused: No    Physically Abused: No    Sexually Abused: No  Depression (PHQ2-9): Low Risk (11/11/2023)   Depression (PHQ2-9)    PHQ-2 Score: 0  Alcohol Screen: Low Risk (07/09/2021)   Alcohol Screen    Last Alcohol Screening Score (AUDIT): 1  Housing: Low Risk  (06/06/2023)  Received from [facility]   Epic    In the last 12 months, was there a time when you were not able to pay  the mortgage or rent on time?: No    In the past 12 months, how many times have you moved where you were living?: 0    At any time in the past 12 months, were you homeless or living in a shelter (including now)?: No  Utilities: Not At Risk (02/08/2023)   Received from [facility] Utilities    Threatened with loss of utilities: No  Health Literacy: Not on file      Review of Systems  Constitutional:  Positive for fatigue. Negative for appetite change, chills and fever.  HENT: Negative.    Respiratory:  Positive for cough (improving). Negative for chest tightness, shortness of breath and wheezing.   Cardiovascular: Negative.  Negative for chest pain and palpitations.  Gastrointestinal: Negative.   Genitourinary: Negative.   Musculoskeletal:  Positive for arthralgias, back pain and gait problem.  Psychiatric/Behavioral:  Positive for sleep disturbance. Negative for self-injury. The patient is nervous/anxious.     Vital Signs: Resp 16   Ht 6' (1.829 m)   Wt 207 lb (93.9 kg)   BMI 28.07 kg/m    Observation/Objective: He is alert and oriented. No acute distress noted.     Assessment/Plan: 1. Centrilobular emphysema (HCC) (Primary) Continue breztri  inhaler as prescribed.  2. Atelectasis, right Discussed incentive spirometry and how to use the device. He does have an incentive spirometer at home.   3. Aortic atherosclerosis Noted. Continue atorvastatin  as prescribed.   4. Deficient knowledge of incentive spirometry Discussed how to use the incentive spirometer and what it's purpose is. Patient acknowledged understanding of how to use the device    General Counseling: akash winski understanding of the findings of today's phone visit and agrees with plan of treatment. I have discussed any further diagnostic evaluation that may be needed or ordered today. We also reviewed his medications today. he has been encouraged to call the office with any  questions or concerns that should arise related to todays visit.  Return for previously scheduled, F/U, Alesia Oshields PCP in january.   No orders of the defined types were placed in this encounter.   No orders of the defined types were placed in this encounter.   Time spent:30 Minutes Time spent with patient included reviewing progress notes, labs, imaging studies, and discussing plan for follow up.  [place] Controlled Substance Database was reviewed by me for overdose risk score (ORS) if appropriate.  This patient was seen by Mardy Maxin, FNP-C in collaboration with Dr. Sigrid Bathe as a part of collaborative care agreement.  Berdella Bacot R. Maxin, MSN, FNP-C Internal medicine

## 2024-02-10 ENCOUNTER — Other Ambulatory Visit: Payer: Self-pay | Admitting: Nurse Practitioner

## 2024-02-10 DIAGNOSIS — I10 Essential (primary) hypertension: Secondary | ICD-10-CM

## 2024-02-14 ENCOUNTER — Ambulatory Visit: Admitting: Internal Medicine

## 2024-02-20 ENCOUNTER — Other Ambulatory Visit

## 2024-02-21 ENCOUNTER — Ambulatory Visit: Admitting: Oncology

## 2024-02-21 ENCOUNTER — Ambulatory Visit

## 2024-02-22 ENCOUNTER — Encounter: Payer: Self-pay | Admitting: Nurse Practitioner

## 2024-02-22 ENCOUNTER — Inpatient Hospital Stay: Attending: Oncology

## 2024-02-22 DIAGNOSIS — Z833 Family history of diabetes mellitus: Secondary | ICD-10-CM | POA: Insufficient documentation

## 2024-02-22 DIAGNOSIS — D509 Iron deficiency anemia, unspecified: Secondary | ICD-10-CM | POA: Diagnosis present

## 2024-02-22 LAB — CBC WITH DIFFERENTIAL/PLATELET
Abs Immature Granulocytes: 0.01 K/uL (ref 0.00–0.07)
Basophils Absolute: 0.1 K/uL (ref 0.0–0.1)
Basophils Relative: 1 %
Eosinophils Absolute: 0.1 K/uL (ref 0.0–0.5)
Eosinophils Relative: 1 %
HCT: 42.4 % (ref 39.0–52.0)
Hemoglobin: 13.4 g/dL (ref 13.0–17.0)
Immature Granulocytes: 0 %
Lymphocytes Relative: 19 %
Lymphs Abs: 1.2 K/uL (ref 0.7–4.0)
MCH: 25.2 pg — ABNORMAL LOW (ref 26.0–34.0)
MCHC: 31.6 g/dL (ref 30.0–36.0)
MCV: 79.7 fL — ABNORMAL LOW (ref 80.0–100.0)
Monocytes Absolute: 0.7 K/uL (ref 0.1–1.0)
Monocytes Relative: 12 %
Neutro Abs: 4.1 K/uL (ref 1.7–7.7)
Neutrophils Relative %: 67 %
Platelets: 221 K/uL (ref 150–400)
RBC: 5.32 MIL/uL (ref 4.22–5.81)
RDW: 16.9 % — ABNORMAL HIGH (ref 11.5–15.5)
WBC: 6.2 K/uL (ref 4.0–10.5)
nRBC: 0 % (ref 0.0–0.2)

## 2024-02-22 LAB — IRON AND TIBC
Iron: 27 ug/dL — ABNORMAL LOW (ref 45–182)
Saturation Ratios: 7 % — ABNORMAL LOW (ref 17.9–39.5)
TIBC: 403 ug/dL (ref 250–450)
UIBC: 377 ug/dL

## 2024-02-22 LAB — FERRITIN: Ferritin: 14 ng/mL — ABNORMAL LOW (ref 24–336)

## 2024-02-24 ENCOUNTER — Inpatient Hospital Stay

## 2024-02-24 ENCOUNTER — Encounter: Payer: Self-pay | Admitting: Oncology

## 2024-02-24 ENCOUNTER — Inpatient Hospital Stay: Attending: Oncology | Admitting: Oncology

## 2024-02-24 VITALS — BP 114/56 | HR 76 | Temp 96.4°F | Resp 18 | Wt 216.7 lb

## 2024-02-24 DIAGNOSIS — Z87891 Personal history of nicotine dependence: Secondary | ICD-10-CM | POA: Insufficient documentation

## 2024-02-24 DIAGNOSIS — G47419 Narcolepsy without cataplexy: Secondary | ICD-10-CM | POA: Diagnosis not present

## 2024-02-24 DIAGNOSIS — Z800 Family history of malignant neoplasm of digestive organs: Secondary | ICD-10-CM | POA: Diagnosis not present

## 2024-02-24 DIAGNOSIS — J4489 Other specified chronic obstructive pulmonary disease: Secondary | ICD-10-CM | POA: Insufficient documentation

## 2024-02-24 DIAGNOSIS — Z860100 Personal history of colon polyps, unspecified: Secondary | ICD-10-CM | POA: Diagnosis not present

## 2024-02-24 DIAGNOSIS — Z9049 Acquired absence of other specified parts of digestive tract: Secondary | ICD-10-CM | POA: Insufficient documentation

## 2024-02-24 DIAGNOSIS — E291 Testicular hypofunction: Secondary | ICD-10-CM | POA: Insufficient documentation

## 2024-02-24 DIAGNOSIS — Z8249 Family history of ischemic heart disease and other diseases of the circulatory system: Secondary | ICD-10-CM | POA: Diagnosis not present

## 2024-02-24 DIAGNOSIS — Z880 Allergy status to penicillin: Secondary | ICD-10-CM | POA: Diagnosis not present

## 2024-02-24 DIAGNOSIS — Z8719 Personal history of other diseases of the digestive system: Secondary | ICD-10-CM | POA: Insufficient documentation

## 2024-02-24 DIAGNOSIS — Z833 Family history of diabetes mellitus: Secondary | ICD-10-CM | POA: Diagnosis not present

## 2024-02-24 DIAGNOSIS — I129 Hypertensive chronic kidney disease with stage 1 through stage 4 chronic kidney disease, or unspecified chronic kidney disease: Secondary | ICD-10-CM | POA: Insufficient documentation

## 2024-02-24 DIAGNOSIS — I252 Old myocardial infarction: Secondary | ICD-10-CM | POA: Insufficient documentation

## 2024-02-24 DIAGNOSIS — Z79899 Other long term (current) drug therapy: Secondary | ICD-10-CM | POA: Insufficient documentation

## 2024-02-24 DIAGNOSIS — Z8049 Family history of malignant neoplasm of other genital organs: Secondary | ICD-10-CM | POA: Diagnosis not present

## 2024-02-24 DIAGNOSIS — E785 Hyperlipidemia, unspecified: Secondary | ICD-10-CM | POA: Insufficient documentation

## 2024-02-24 DIAGNOSIS — D509 Iron deficiency anemia, unspecified: Secondary | ICD-10-CM | POA: Insufficient documentation

## 2024-02-24 DIAGNOSIS — Z888 Allergy status to other drugs, medicaments and biological substances status: Secondary | ICD-10-CM | POA: Diagnosis not present

## 2024-02-24 DIAGNOSIS — Z8701 Personal history of pneumonia (recurrent): Secondary | ICD-10-CM | POA: Insufficient documentation

## 2024-02-24 DIAGNOSIS — I2510 Atherosclerotic heart disease of native coronary artery without angina pectoris: Secondary | ICD-10-CM | POA: Insufficient documentation

## 2024-02-24 DIAGNOSIS — Z801 Family history of malignant neoplasm of trachea, bronchus and lung: Secondary | ICD-10-CM | POA: Diagnosis not present

## 2024-02-24 DIAGNOSIS — D508 Other iron deficiency anemias: Secondary | ICD-10-CM

## 2024-02-24 DIAGNOSIS — N183 Chronic kidney disease, stage 3 unspecified: Secondary | ICD-10-CM | POA: Insufficient documentation

## 2024-02-24 DIAGNOSIS — Z87442 Personal history of urinary calculi: Secondary | ICD-10-CM | POA: Diagnosis not present

## 2024-02-24 MED ORDER — IRON SUCROSE 20 MG/ML IV SOLN
200.0000 mg | Freq: Once | INTRAVENOUS | Status: AC
  Administered 2024-02-24: 200 mg via INTRAVENOUS
  Filled 2024-02-24: qty 10

## 2024-02-24 NOTE — Progress Notes (Signed)
 Patient tolerated Venofer  well, no questions/concerns voiced. Monitored 30 min post transfusion. Patient stable at discharge. VSS. AVS given.

## 2024-02-24 NOTE — Patient Instructions (Signed)

## 2024-02-24 NOTE — Progress Notes (Signed)
 Patient states that he is fatigued all the time. He states that he has been getting dizzy and that it has gotten worse over the past two weeks.

## 2024-02-24 NOTE — Progress Notes (Signed)
 " [facility]  [phone]) [phone] [phone]) [phone]  ID: Randy Mclean Younger OB: [birth year]  MR#: [id number]  RDW#:[id number]  Patient Care Team: Liana Fish, NP as PCP - General (Nurse Practitioner) Fernand Sigrid HERO, MD (Internal Medicine) Jacobo Evalene PARAS, MD as Consulting Physician (Oncology)   CHIEF COMPLAINT: Iron  deficiency anemia.  INTERVAL HISTORY: Patient returns to clinic today for repeat laboratory, further evaluation, and continuation of IV Venofer .  He has noticed increased fatigue, but otherwise feels well.  He has no neurologic complaints.  He denies any recent fevers or illnesses.  He has no chest pain, shortness of breath, cough, or hemoptysis.  He denies any nausea, vomiting, constipation, or diarrhea.  He has no melena or hematochezia.  He has no urinary complaints.  Patient offers no further specific complaints today.  REVIEW OF SYSTEMS:   Review of Systems  Constitutional: Negative.  Negative for fever, malaise/fatigue and weight loss.  Respiratory: Negative.  Negative for cough and shortness of breath.   Cardiovascular: Negative.  Negative for chest pain and leg swelling.  Gastrointestinal: Negative.  Negative for abdominal pain, blood in stool and melena.  Genitourinary: Negative.  Negative for hematuria.  Musculoskeletal: Negative.  Negative for back pain and neck pain.  Skin: Negative.  Negative for rash.  Neurological: Negative.  Negative for dizziness, sensory change, focal weakness, weakness and headaches.  Psychiatric/Behavioral: Negative.  The patient is not nervous/anxious.     As per HPI. Otherwise, a complete review of systems is negative.  PAST MEDICAL HISTORY: Past Medical History:  Diagnosis Date   Acute anterolateral wall MI [facility])    a.) age of 61 (approx 20); denies LHC/PCI procedure   Acute appendicitis 12/04/2017   Allergy    takes allergy shots   Anxiety    a.) on BZO (alproazolam) PRN   Aortic  atherosclerosis    Arthritis    Asthma    Atypical chest pain    Barrett esophagus    Bilateral carotid artery stenosis    BPH (benign prostatic hyperplasia)    Cervicalgia    Chronic pain    CKD (chronic kidney disease) stage 3, GFR 30-59 ml/min (HCC)    Colon polyp    COPD (chronic obstructive pulmonary disease) (HCC)    Coronary artery disease    Depression    Diastolic dysfunction    a.) TTE 06/11/2019: EF >55%, mild LVH, G1DD, mild BAE, mild RVE, triv TR/PR, mild MR   Dieulafoy lesion of colon    Diverticulosis    Former smoker    GERD (gastroesophageal reflux disease)    Hashimoto's thyroiditis    Hemorrhoid    History of kidney stones    Hyperlipidemia    Hypertension    Hypothyroid    IBS (irritable bowel syndrome)    IDA (iron  deficiency anemia)    a.) has required parenteral iron  infusions   Left carpal tunnel syndrome    Male hypogonadism    a.) on exogenous TRT injections (depotestosterone cypionate)   Migraine    Narcolepsy    a.) takes modafinil    Pneumonia    Pre-diabetes    Right rotator cuff tendonitis    Seizures (HCC)    Spinal stenosis of lumbar region    a.) s/p spinal cord stimulator placement   Status post insertion of spinal cord stimulator     PAST SURGICAL HISTORY: Past Surgical History:  Procedure Laterality Date   ANTERIOR CERVICAL DISCECTOMY     APPENDECTOMY  BACK SURGERY     BACK SURGERY  12/04/2020   COLONOSCOPY  09/2013   COLONOSCOPY N/A 05/09/2022   Procedure: COLONOSCOPY;  Surgeon: Onita Elspeth Sharper, DO;  Location[facility] END[facility]COPY;  Service: Gastroenterology;  Laterality: N/A;   elbow surgery     ESOPHAGOGASTRODUODENOSCOPY (EGD) WITH PROPOFOL  N/A 05/09/2022   Procedure: ESOPHAGOGASTRODUODENOSCOPY (EGD) WITH PROPOFOL ;  Surgeon: Onita Elspeth Sharper, DO;  Location: [facility] ENDOSCOPY;  Service: Gastroenterology;  Laterality: N/A;   GIVENS CAPSULE STUDY N/A 05/09/2022   Procedure: GIVENS CAPSULE STUDY;  Surgeon: Onita Elspeth Sharper, DO;  Location: [facility] ENDOSCOPY;  Service: Gastroenterology;  Laterality: N/A;   HEMORRHOID SURGERY     HEMORRHOID SURGERY N/A 05/06/2023   Procedure: HEMORRHOIDECTOMY;  Surgeon: Tye Millet, DO;  Location: [facility] ORS;  Service: General;  Laterality: N/A;   LAPAROSCOPIC APPENDECTOMY N/A 12/04/2017   Procedure: APPENDECTOMY LAPAROSCOPIC;  Surgeon: Nicholaus Selinda Birmingham, MD;  Location: [facility] ORS;  Service: General;  Laterality: N/A;   LIPOMA EXCISION Right    leg   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     SPINAL CORD STIMULATOR IMPLANT     UPPER GI ENDOSCOPY  09/2013   WRIST SURGERY Left 01/14/2021    FAMILY HISTORY Family History  Problem Relation Age of Onset   Hypertension Mother    Lung disease Mother    Heart attack Father    Diabetes Father    Liver cancer Sister    Lung cancer Sister    Colon cancer Sister    Uterine cancer Sister        ADVANCED DIRECTIVES:    HEALTH MAINTENANCE: Social History   Tobacco Use   Smoking status: Former    Current packs/day: 0.00    Average packs/day: 1 pack/day for 30.0 years (30.0 ttl pk-yrs)    Types: Cigarettes    Start date: 02/23/1975    Quit date: 02/22/2005    Years since quitting: 19.0   Smokeless tobacco: Never  Vaping Use   Vaping status: Never Used  Substance Use Topics   Alcohol use: Yes    Alcohol/week: 0.0 standard drinks of alcohol    Comment: occasionally   Drug use: No    Comment: past     Colonoscopy:  PAP:  Bone density:  Lipid panel:  Allergies  Allergen Reactions   Amoxicillin Anaphylaxis    REACTION: unspecified   Gabapentin Anaphylaxis   Penicillin G Anaphylaxis    Current Outpatient Medications  Medication Sig Dispense Refill   alfuzosin  (UROXATRAL ) 10 MG 24 hr tablet TAKE 1 TABLET AT BEDTIME 90 tablet 1   ALPRAZolam  (XANAX ) 0.5 MG tablet Take 1 tablet (0.5 mg total) by mouth 3 (three) times daily as needed for anxiety. 90 tablet 2   amLODipine  (NORVASC ) 10 MG tablet Take 1 tablet (10  mg total) by mouth daily. 90 tablet 1   atorvastatin  (LIPITOR) 10 MG tablet Take 1 tablet (10 mg total) by mouth daily. 90 tablet 1   benzonatate  (TESSALON ) 200 MG capsule Take 1 capsule (200 mg total) by mouth 2 (two) times daily as needed for cough. 30 capsule 3   budesonide-glycopyrrolate-formoterol  (BREZTRI  AEROSPHERE) 160-9-4.8 MCG/ACT AERO inhaler Inhale 2 puffs into the lungs in the morning and at bedtime. 10.7 g 5   buPROPion  (WELLBUTRIN  XL) 300 MG 24 hr tablet Take 1 tablet (300 mg total) by mouth daily. 90 tablet 1   esomeprazole  (NEXIUM ) 40 MG capsule Take 1 capsule (40 mg total) by mouth 2 (two) times daily before a  meal. 180 capsule 1   fexofenadine (ALLEGRA) 180 MG tablet Take 180 mg by mouth 2 (two) times daily.     FIBER GUMMIES PO Take 2 tablets by mouth daily.     hydrochlorothiazide  (HYDRODIURIL ) 12.5 MG tablet TAKE 1 TABLET DAILY 90 tablet 1   ipratropium-albuterol  (DUONEB) 0.5-2.5 (3) MG/3ML SOLN Take 3 mLs by nebulization every 6 (six) hours as needed (SOB/wheezing/cough). 360 mL 3   levofloxacin  (LEVAQUIN ) 500 MG tablet Take 1 tablet (500 mg total) by mouth daily. 10 tablet 0   levothyroxine  (SYNTHROID ) 150 MCG tablet Take 1 tablet (150 mcg total) by mouth daily before breakfast. 90 tablet 1   methocarbamol  (ROBAXIN ) 750 MG tablet Take 750 mg by mouth 4 (four) times daily as needed for muscle spasms.     metoprolol  succinate (TOPROL -XL) 25 MG 24 hr tablet Take 25 mg by mouth at bedtime.     modafinil  (PROVIGIL ) 200 MG tablet Take 1 tablet (200 mg total) by mouth daily. 30 tablet 2   Multiple Vitamin (MULTI-VITAMINS) TABS Take 1 tablet by mouth daily.     oxyCODONE -acetaminophen  (PERCOCET) 10-325 MG tablet Take 1 tablet by mouth every 6 (six) hours as needed.     pneumococcal 20-valent conjugate vaccine (PREVNAR 20) 0.5 ML injection Inject 0.5 mLs into the muscle once as needed for up to 1 dose for immunization. 0.5 mL 0   pregabalin  (LYRICA ) 100 MG capsule Take 1 capsule (100  mg total) by mouth every 8 (eight) hours. 10 capsule 0   senna (SENOKOT) 8.6 MG TABS tablet Take 1 tablet by mouth at bedtime.     SYRINGE-NEEDLE, DISP, 3 ML 22G X 1 3 ML MISC Use as directed.     testosterone  cypionate (DEPOTESTOSTERONE CYPIONATE) 200 MG/ML injection INJECT 0.7 MLS (140 MG TOTAL) INTO THE MUSCLE ONCE A WEEK. (DISCARD VIAL AFTER SINGLE USE) 4 mL 2   tiZANidine  (ZANAFLEX ) 4 MG tablet Take 4 mg by mouth at bedtime.     valsartan  (DIOVAN ) 80 MG tablet Take 80 mg by mouth at bedtime.     oxyCODONE -acetaminophen  (PERCOCET/ROXICET) 5-325 MG tablet Take 1 tablet by mouth every 6 (six) hours as needed. (Patient not taking: Reported on 02/24/2024)     No current facility-administered medications for this visit.    OBJECTIVE: Vitals:   02/24/24 0955  BP: (!) 114/56  Pulse: 76  Resp: 18  Temp: (!) 96.4 F (35.8 C)  SpO2: 98%       Body mass index is 29.39 kg/m.    ECOG FS:0 - Asymptomatic  General: Well-developed, well-nourished, no acute distress. Eyes: Pink conjunctiva, anicteric sclera. HEENT: Normocephalic, moist mucous membranes. Lungs: No audible wheezing or coughing. Heart: Regular rate and rhythm. Abdomen: Soft, nontender, no obvious distention. Musculoskeletal: No edema, cyanosis, or clubbing. Neuro: Alert, answering all questions appropriately. Cranial nerves grossly intact. Skin: No rashes or petechiae noted. Psych: Normal affect.  LAB RESULTS:  Lab Results  Component Value Date   NA 140 01/17/2024   K 4.1 01/17/2024   CL 98 01/17/2024   CO2 26 01/17/2024   GLUCOSE 96 01/17/2024   BUN 14 01/17/2024   CREATININE 1.21 01/17/2024   CALCIUM  9.4 01/17/2024   PROT 7.0 01/17/2024   ALBUMIN 4.6 01/17/2024   AST 22 01/17/2024   ALT 15 01/17/2024   ALKPHOS 101 01/17/2024   BILITOT 0.5 01/17/2024   GFRNONAA >60 05/03/2023   GFRAA >60 05/05/2019    Lab Results  Component Value Date   WBC 6.2  02/22/2024   NEUTROABS 4.1 02/22/2024   HGB 13.4 02/22/2024    HCT 42.4 02/22/2024   MCV 79.7 (L) 02/22/2024   PLT 221 02/22/2024   Lab Results  Component Value Date   IRON  27 (L) 02/22/2024   TIBC 403 02/22/2024   IRONPCTSAT 7 (L) 02/22/2024   Lab Results  Component Value Date   FERRITIN 14 (L) 02/22/2024     STUDIES: CT Chest Wo Contrast Result Date: 02/01/2024 EXAM: CT CHEST WITHOUT CONTRAST 01/25/2024 12:48:58 PM TECHNIQUE: CT of the chest was performed without the administration of intravenous contrast. Multiplanar reformatted images are provided for review. Automated exposure control, iterative reconstruction, and/or weight based adjustment of the mA/kV was utilized to reduce the radiation dose to as low as reasonably achievable. COMPARISON: PA and lateral chest 08/12/2023, chest CT without contrast 09/08/2022, chest CT with contrast 05/06/2015. CLINICAL HISTORY: Follow-up for right suprahilar opacity on the most recent chest x-ray, follow up opacities in the right lower lobe on the last chest CT. FINDINGS: MEDIASTINUM: The cardiac size is normal. There is no pericardial effusion and no visible coronary calcification. There is patchy calcification of the aortic arch. Thoracic aorta and great vessels are otherwise unremarkable without contrast. There is no aortic aneurysm. Pulmonary arteries and veins are normal in caliber. The trachea and main central airways are patent. There is mild thickening in the central bronchi in both lungs. LYMPH NODES: No mediastinal, hilar or axillary lymphadenopathy. LUNGS AND PLEURA: Asymmetric minimal hazy subpleural opacities posteriorly in the right lower lobe, seen first on the last CT but unchanged and most likely indicating asymmetric dependent atelectasis. There are chronic biapical scarring changes with mild paraseptal and centrilobular emphysematous changes in the upper lobes. There is mild chronic elevation of the right hemidiaphragm. No focal consolidation or pulmonary edema. No pleural effusion or  pneumothorax. No suspicious nodule is seen. SOFT TISSUES/BONES: Spinal cord stimulator wiring noted in the dorsal midthoracic spinal canal. Mild degenerative change of the spine. Partially visible lower cervical ACDF plating. No acute or other significant osseous findings. No acute abnormality of the soft tissues. UPPER ABDOMEN: Limited images of the upper abdomen demonstrates no acute abnormality. IMPRESSION: 1. No acute CT findings. 2. Asymmetric minimal hazy subpleural opacities in the posterior right lower lobe, unchanged from prior CT, most likely representing dependent atelectasis. 3. Mild paraseptal and centrilobular emphysema in the upper lobes. Consider evaluation for eligibility for low-dose CT lung cancer screening. 4. Aortic atherosclerosis. Electronically signed by: Randy Quam MD 02/01/2024 12:22 AM EST RP Workstation: [id number]    ASSESSMENT: Iron  deficiency anemia.  PLAN:    Iron  deficiency anemia: EGD and colonoscopy on May 09, 2022 did not reveal any significant pathology.  Patient also had capsule endoscopy, but these results are not available at this time.  Patient underwent hemorrhoidectomy in March 2025.  Patient's hemoglobin continues to be within normal limits, but his iron  stores remain decreased.  Will proceed with 200 mg IV Venofer  today.  Patient will return to clinic 2 times over the next 1 to 2 weeks for additional treatment.  He would then return to clinic in 3 months with repeat laboratory, further evaluation, and continuation of treatment if needed.   GI bleed: Resolved.  Hemorrhoidectomy as above. Family history of colon cancer:  Patient gets routine colonoscopies given his family history of colon cancer, all of which been unrevealing.  His genetic testing is negative.  Patient has been instructed to call GI to schedule his next colonoscopy which will  be due in approximately March 2026.  I spent a total of 30 minutes reviewing chart data, face-to-face evaluation with  the patient, counseling and coordination of care as detailed above.   Patient expressed understanding and was in agreement with this plan. He also understands that He can call clinic at any time with any questions, concerns, or complaints.    Evalene JINNY Reusing, MD   02/24/2024 10:10 AM      "

## 2024-02-25 ENCOUNTER — Encounter: Payer: Self-pay | Admitting: Oncology

## 2024-02-28 ENCOUNTER — Inpatient Hospital Stay

## 2024-02-28 ENCOUNTER — Encounter: Payer: Self-pay | Admitting: Oncology

## 2024-02-28 VITALS — BP 152/76 | HR 82 | Temp 99.3°F

## 2024-02-28 DIAGNOSIS — D509 Iron deficiency anemia, unspecified: Secondary | ICD-10-CM | POA: Diagnosis not present

## 2024-02-28 DIAGNOSIS — D508 Other iron deficiency anemias: Secondary | ICD-10-CM

## 2024-02-28 MED ORDER — IRON SUCROSE 20 MG/ML IV SOLN
200.0000 mg | Freq: Once | INTRAVENOUS | Status: AC
  Administered 2024-02-28: 200 mg via INTRAVENOUS
  Filled 2024-02-28: qty 10

## 2024-02-28 NOTE — Patient Instructions (Signed)

## 2024-03-06 ENCOUNTER — Inpatient Hospital Stay

## 2024-03-12 ENCOUNTER — Telehealth: Payer: Self-pay | Admitting: Nurse Practitioner

## 2024-03-12 ENCOUNTER — Other Ambulatory Visit: Payer: Self-pay | Admitting: Nurse Practitioner

## 2024-03-12 DIAGNOSIS — Z79899 Other long term (current) drug therapy: Secondary | ICD-10-CM

## 2024-03-12 NOTE — Telephone Encounter (Signed)
 Left patient vm to schedule follow up-[name]

## 2024-03-13 ENCOUNTER — Inpatient Hospital Stay

## 2024-03-13 ENCOUNTER — Telehealth: Payer: Self-pay | Admitting: Nurse Practitioner

## 2024-03-13 VITALS — BP 145/83 | HR 69 | Temp 97.8°F | Resp 16

## 2024-03-13 DIAGNOSIS — D509 Iron deficiency anemia, unspecified: Secondary | ICD-10-CM | POA: Diagnosis not present

## 2024-03-13 DIAGNOSIS — D508 Other iron deficiency anemias: Secondary | ICD-10-CM

## 2024-03-13 MED ORDER — IRON SUCROSE 20 MG/ML IV SOLN
200.0000 mg | Freq: Once | INTRAVENOUS | Status: AC
  Administered 2024-03-13: 200 mg via INTRAVENOUS
  Filled 2024-03-13: qty 10

## 2024-03-13 NOTE — Patient Instructions (Signed)

## 2024-03-13 NOTE — Telephone Encounter (Signed)
 Left 2nd vm to schedule follow up appointment-[name]

## 2024-03-15 ENCOUNTER — Encounter: Payer: Self-pay | Admitting: Oncology

## 2024-03-19 ENCOUNTER — Other Ambulatory Visit: Payer: Self-pay | Admitting: Urology

## 2024-03-19 ENCOUNTER — Other Ambulatory Visit: Payer: Self-pay | Admitting: Nurse Practitioner

## 2024-03-19 DIAGNOSIS — G47429 Narcolepsy in conditions classified elsewhere without cataplexy: Secondary | ICD-10-CM

## 2024-03-20 ENCOUNTER — Other Ambulatory Visit: Payer: Self-pay

## 2024-03-20 DIAGNOSIS — I10 Essential (primary) hypertension: Secondary | ICD-10-CM

## 2024-03-20 MED ORDER — HYDROCHLOROTHIAZIDE 12.5 MG PO TABS: 12.5000 mg | ORAL_TABLET | Freq: Every day | ORAL | 1 refills | Status: AC

## 2024-03-21 ENCOUNTER — Encounter: Payer: Self-pay | Admitting: Urology

## 2024-03-22 ENCOUNTER — Encounter: Payer: Self-pay | Admitting: Nurse Practitioner

## 2024-03-22 ENCOUNTER — Ambulatory Visit: Admitting: Nurse Practitioner

## 2024-03-22 VITALS — BP 136/78 | HR 78 | Temp 98.3°F | Resp 16 | Ht 72.0 in | Wt 220.2 lb

## 2024-03-22 DIAGNOSIS — M79675 Pain in left toe(s): Secondary | ICD-10-CM | POA: Diagnosis not present

## 2024-03-22 DIAGNOSIS — M7731 Calcaneal spur, right foot: Secondary | ICD-10-CM

## 2024-03-22 DIAGNOSIS — G47429 Narcolepsy in conditions classified elsewhere without cataplexy: Secondary | ICD-10-CM

## 2024-03-22 DIAGNOSIS — J432 Centrilobular emphysema: Secondary | ICD-10-CM | POA: Diagnosis not present

## 2024-03-22 MED ORDER — MODAFINIL 200 MG PO TABS: 200.0000 mg | ORAL_TABLET | Freq: Every day | ORAL | 2 refills | Status: AC

## 2024-03-22 MED ORDER — BREZTRI AEROSPHERE 160-9-4.8 MCG/ACT IN AERO: 2.0000 | INHALATION_SPRAY | Freq: Two times a day (BID) | RESPIRATORY_TRACT | 5 refills | Status: AC

## 2024-03-22 NOTE — Progress Notes (Signed)
 [facility] [address] [place], [place] [address]  Internal MEDICINE  Office Visit Note  Patient Name: Randy Mclean Younger  [id number]  [id number]  Date of Service: 03/22/2024  Chief Complaint  Patient presents with   Depression   Gastroesophageal Reflux   Hypertension   Hyperlipidemia   Follow-up    HPI Ameya presents for a follow-up visit for right foot pain, left toe pain, narcolepsy and refills.  Right heel pain -- bump on heel, probably bone spur.  Left great toe pain -- previously fractured this toe several years ago, decreased ROM and intermittent pain.  Emphysema/COPD -- uses breztri  inhaler twice daily which helps a lot.  Narcolepsy -- takes modafinil  daily as needed for excessive daytime sleepiness related to narcolepsy.     Current Medication: Outpatient Encounter Medications as of 03/22/2024  Medication Sig   alfuzosin  (UROXATRAL ) 10 MG 24 hr tablet TAKE 1 TABLET AT BEDTIME   ALPRAZolam  (XANAX ) 0.5 MG tablet TAKE 1 TABLET (0.5 MG TOTAL) BY MOUTH 3 (THREE) TIMES DAILY AS NEEDED FOR ANXIETY.   amLODipine  (NORVASC ) 10 MG tablet Take 1 tablet (10 mg total) by mouth daily.   atorvastatin  (LIPITOR) 10 MG tablet Take 1 tablet (10 mg total) by mouth daily.   benzonatate  (TESSALON ) 200 MG capsule Take 1 capsule (200 mg total) by mouth 2 (two) times daily as needed for cough.   budesonide-glycopyrrolate-formoterol  (BREZTRI  AEROSPHERE) 160-9-4.8 MCG/ACT AERO inhaler Inhale 2 puffs into the lungs in the morning and at bedtime.   buPROPion  (WELLBUTRIN  XL) 300 MG 24 hr tablet Take 1 tablet (300 mg total) by mouth daily.   esomeprazole  (NEXIUM ) 40 MG capsule Take 1 capsule (40 mg total) by mouth 2 (two) times daily before a meal.   fexofenadine (ALLEGRA) 180 MG tablet Take 180 mg by mouth 2 (two) times daily.   FIBER GUMMIES PO Take 2 tablets by mouth daily.   hydrochlorothiazide  (HYDRODIURIL ) 12.5 MG tablet Take 1 tablet (12.5 mg total) by mouth daily.    ipratropium-albuterol  (DUONEB) 0.5-2.5 (3) MG/3ML SOLN Take 3 mLs by nebulization every 6 (six) hours as needed (SOB/wheezing/cough).   levothyroxine  (SYNTHROID ) 150 MCG tablet Take 1 tablet (150 mcg total) by mouth daily before breakfast.   methocarbamol  (ROBAXIN ) 750 MG tablet Take 750 mg by mouth 4 (four) times daily as needed for muscle spasms.   metoprolol  succinate (TOPROL -XL) 25 MG 24 hr tablet Take 25 mg by mouth at bedtime.   modafinil  (PROVIGIL ) 200 MG tablet Take 1 tablet (200 mg total) by mouth daily.   Multiple Vitamin (MULTI-VITAMINS) TABS Take 1 tablet by mouth daily.   oxyCODONE -acetaminophen  (PERCOCET) 10-325 MG tablet Take 1 tablet by mouth every 6 (six) hours as needed.   oxyCODONE -acetaminophen  (PERCOCET/ROXICET) 5-325 MG tablet Take 1 tablet by mouth every 6 (six) hours as needed. (Patient not taking: Reported on 02/24/2024)   pneumococcal 20-valent conjugate vaccine (PREVNAR 20) 0.5 ML injection Inject 0.5 mLs into the muscle once as needed for up to 1 dose for immunization.   pregabalin  (LYRICA ) 100 MG capsule Take 1 capsule (100 mg total) by mouth every 8 (eight) hours.   senna (SENOKOT) 8.6 MG TABS tablet Take 1 tablet by mouth at bedtime.   SYRINGE-NEEDLE, DISP, 3 ML 22G X 1 3 ML MISC Use as directed.   testosterone  cypionate (DEPOTESTOSTERONE CYPIONATE) 200 MG/ML injection INJECT 0.7 MLS (140 MG TOTAL) INTO THE MUSCLE ONCE A WEEK. (DISCARD VIAL AFTER SINGLE USE)   tiZANidine  (ZANAFLEX ) 4 MG tablet  Take 4 mg by mouth at bedtime.   valsartan  (DIOVAN ) 80 MG tablet Take 80 mg by mouth at bedtime.   [DISCONTINUED] budesonide-glycopyrrolate-formoterol  (BREZTRI  AEROSPHERE) 160-9-4.8 MCG/ACT AERO inhaler Inhale 2 puffs into the lungs in the morning and at bedtime.   [DISCONTINUED] levofloxacin  (LEVAQUIN ) 500 MG tablet Take 1 tablet (500 mg total) by mouth daily.   [DISCONTINUED] modafinil  (PROVIGIL ) 200 MG tablet TAKE 1 TABLET BY MOUTH EVERY DAY   No facility-administered  encounter medications on file as of 03/22/2024.    Surgical History: Past Surgical History:  Procedure Laterality Date   ANTERIOR CERVICAL DISCECTOMY     APPENDECTOMY     BACK SURGERY     BACK SURGERY  12/04/2020   COLONOSCOPY  09/2013   COLONOSCOPY N/A 05/09/2022   Procedure: COLONOSCOPY;  Surgeon: Onita Elspeth Sharper, DO;  Location[facility] ENDOSCOPY;  Service: Gastroenterology;  Laterality: N/A;   elbow surgery     ESOPHAGOGASTRODUODENOSCOPY (EGD) WITH PROPOFOL  N/A 05/09/2022   Procedure: ESOPHAGOGASTRODUODENOSCOPY (EGD) WITH PROPOFOL ;  Surgeon: Onita Elspeth Sharper, DO;  Location: [facility] ENDOSCOPY;  Service: Gastroenterology;  Laterality: N/A;   GIVENS CAPSULE STUDY N/A 05/09/2022   Procedure: GIVENS CAPSULE STUDY;  Surgeon: Onita Elspeth Sharper, DO;  Location: [facility] ENDOSCOPY;  Service: Gastroenterology;  Laterality: N/A;   HEMORRHOID SURGERY     HEMORRHOID SURGERY N/A 05/06/2023   Procedure: HEMORRHOIDECTOMY;  Surgeon: Tye Millet, DO;  Location: [facility] ORS;  Service: General;  Laterality: N/A;   LAPAROSCOPIC APPENDECTOMY N/A 12/04/2017   Procedure: APPENDECTOMY LAPAROSCOPIC;  Surgeon: Nicholaus Selinda Birmingham, MD;  Location: [facility] ORS;  Service: General;  Laterality: N/A;   LIPOMA EXCISION Right    leg   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     SPINAL CORD STIMULATOR IMPLANT     UPPER GI ENDOSCOPY  09/2013   WRIST SURGERY Left 01/14/2021    Medical History: Past Medical History:  Diagnosis Date   Acute anterolateral wall MI [facility])    a.) age of 31 (approx 78); denies LHC/PCI procedure   Acute appendicitis 12/04/2017   Allergy    takes allergy shots   Anxiety    a.) on BZO (alproazolam) PRN   Aortic atherosclerosis    Arthritis    Asthma    Atypical chest pain    Barrett esophagus    Bilateral carotid artery stenosis    BPH (benign prostatic hyperplasia)    Cervicalgia    Chronic pain    CKD (chronic kidney disease) stage 3, GFR 30-59 ml/min (HCC)    Colon polyp     COPD (chronic obstructive pulmonary disease) (HCC)    Coronary artery disease    Depression    Diastolic dysfunction    a.) TTE 06/11/2019: EF >55%, mild LVH, G1DD, mild BAE, mild RVE, triv TR/PR, mild MR   Dieulafoy lesion of colon    Diverticulosis    Former smoker    GERD (gastroesophageal reflux disease)    Hashimoto's thyroiditis    Hemorrhoid    History of kidney stones    Hyperlipidemia    Hypertension    Hypothyroid    IBS (irritable bowel syndrome)    IDA (iron  deficiency anemia)    a.) has required parenteral iron  infusions   Left carpal tunnel syndrome    Male hypogonadism    a.) on exogenous TRT injections (depotestosterone cypionate)   Migraine    Narcolepsy    a.) takes modafinil    Pneumonia    Pre-diabetes    Right rotator cuff  tendonitis    Seizures (HCC)    Spinal stenosis of lumbar region    a.) s/p spinal cord stimulator placement   Status post insertion of spinal cord stimulator     Family History: Family History  Problem Relation Age of Onset   Hypertension Mother    Lung disease Mother    Heart attack Father    Diabetes Father    Liver cancer Sister    Lung cancer Sister    Colon cancer Sister    Uterine cancer Sister     Social History   Socioeconomic History   Marital status: Married    Spouse name: Not on file   Number of children: Not on file   Years of education: Not on file   Highest education level: Not on file  Occupational History   Not on file  Tobacco Use   Smoking status: Former    Current packs/day: 0.00    Average packs/day: 1 pack/day for 30.0 years (30.0 ttl pk-yrs)    Types: Cigarettes    Start date: 02/23/1975    Quit date: 02/22/2005    Years since quitting: 19.0   Smokeless tobacco: Never  Vaping Use   Vaping status: Never Used  Substance and Sexual Activity   Alcohol use: Yes    Alcohol/week: 0.0 standard drinks of alcohol    Comment: occasionally   Drug use: No    Comment: past   Sexual activity: Yes   Other Topics Concern   Not on file  Social History Narrative   Not on file   Social Drivers of Health   Tobacco Use: Medium Risk (03/22/2024)   Patient History    Smoking Tobacco Use: Former    Smokeless Tobacco Use: Never    Passive Exposure: Not on file  Financial Resource Strain: Low Risk  (02/08/2023)   Received from [facility]   Overall Financial Resource Strain (CARDIA)    Difficulty of Paying Living Expenses: Not very hard  Food Insecurity: Food Insecurity Present (02/08/2023)   Received from [facility]   Epic    Within the past 12 months, you worried that your food would run out before you got the money to buy more.: Sometimes true    Within the past 12 months, the food you bought just didn't last and you didn't have money to get more.: Never true  Transportation Needs: No Transportation Needs (02/08/2023)   Received from [facility] - Transportation    In the past 12 months, has lack of transportation kept you from medical appointments or from getting medications?: No    Lack of Transportation (Non-Medical): No  Physical Activity: Not on file  Stress: Not on file  Social Connections: Not on file  Intimate Partner Violence: Not At Risk (05/07/2022)   Humiliation, Afraid, Rape, and Kick questionnaire    Fear of Current or Ex-Partner: No    Emotionally Abused: No    Physically Abused: No    Sexually Abused: No  Depression (PHQ2-9): Low Risk (11/11/2023)   Depression (PHQ2-9)    PHQ-2 Score: 0  Alcohol Screen: Low Risk (07/09/2021)   Alcohol Screen    Last Alcohol Screening Score (AUDIT): 1  Housing: Low Risk  (06/06/2023)   Received from [facility]   Epic    In the last 12 months, was there a time when you were not able to pay the mortgage or rent on time?: No  In the past 12 months, how many times have you moved where you were living?: 0    At any time in the past 12 months, were  you homeless or living in a shelter (including now)?: No  Utilities: Not At Risk (02/08/2023)   Received from [facility] Utilities    Threatened with loss of utilities: No  Health Literacy: Not on file      Review of Systems  Constitutional:  Positive for fatigue. Negative for appetite change, chills and fever.  HENT: Negative.    Respiratory:  Positive for cough (improving). Negative for chest tightness, shortness of breath and wheezing.   Cardiovascular: Negative.  Negative for chest pain and palpitations.  Gastrointestinal: Negative.   Genitourinary: Negative.   Musculoskeletal:  Positive for arthralgias, back pain and gait problem.  Psychiatric/Behavioral:  Positive for sleep disturbance. Negative for self-injury. The patient is nervous/anxious.     Vital Signs: BP 136/78   Pulse 78   Temp 98.3 F (36.8 C)   Resp 16   Ht 6' (1.829 m)   Wt 220 lb 3.2 oz (99.9 kg)   SpO2 96%   BMI 29.86 kg/m    Physical Exam Vitals reviewed.  Constitutional:      General: He is not in acute distress.    Appearance: Normal appearance. He is obese. He is not ill-appearing.  HENT:     Head: Normocephalic and atraumatic.  Eyes:     Pupils: Pupils are equal, round, and reactive to light.  Cardiovascular:     Rate and Rhythm: Normal rate and regular rhythm.     Heart sounds: Normal heart sounds. No murmur heard. Pulmonary:     Effort: Pulmonary effort is normal. No respiratory distress.     Breath sounds: Normal breath sounds. No wheezing.  Skin:    General: Skin is warm and dry.     Capillary Refill: Capillary refill takes less than 2 seconds.  Neurological:     Mental Status: He is alert and oriented to person, place, and time.     Cranial Nerves: No cranial nerve deficit.     Coordination: Coordination normal.     Gait: Gait normal.  Psychiatric:        Mood and Affect: Mood normal.        Behavior: Behavior normal.        Assessment/Plan: 1.  Centrilobular emphysema (HCC) (Primary) Continue breztri  inhaler as prescribed.  - budesonide-glycopyrrolate-formoterol  (BREZTRI  AEROSPHERE) 160-9-4.8 MCG/ACT AERO inhaler; Inhale 2 puffs into the lungs in the morning and at bedtime.  Dispense: 10.7 g; Refill: 5  2. Heel spur, right Referred to podiatry - Ambulatory referral to Podiatry  3. Pain of left great toe Referred to podiatry - Ambulatory referral to Podiatry  4. Narcolepsy due to underlying condition without cataplexy Continue modafinil  as prescribed.  - modafinil  (PROVIGIL ) 200 MG tablet; Take 1 tablet (200 mg total) by mouth daily.  Dispense: 30 tablet; Refill: 2   General Counseling: Trevonte verbalizes understanding of the findings of todays visit and agrees with plan of treatment. I have discussed any further diagnostic evaluation that may be needed or ordered today. We also reviewed his medications today. he has been encouraged to call the office with any questions or concerns that should arise related to todays visit.    Orders Placed This Encounter  Procedures   Ambulatory referral to Podiatry    Meds ordered this encounter  Medications   modafinil  (PROVIGIL )  200 MG tablet    Sig: Take 1 tablet (200 mg total) by mouth daily.    Dispense:  30 tablet    Refill:  2    Not to exceed 3 additional fills before 05/27/2024   budesonide-glycopyrrolate-formoterol  (BREZTRI  AEROSPHERE) 160-9-4.8 MCG/ACT AERO inhaler    Sig: Inhale 2 puffs into the lungs in the morning and at bedtime.    Dispense:  10.7 g    Refill:  5    Return in about 3 months (around 06/13/2024) for F/U, med refill, Vonzell Lindblad PCP.   Total time spent: 30 Minutes Time spent includes review of chart, medications, test results, and follow up plan with the patient.   [place] Controlled Substance Database was reviewed by me.  This patient was seen by Mardy Maxin, FNP-C in collaboration with Dr. Sigrid Bathe as a part of collaborative care  agreement.   Estefan Pattison R. Maxin, MSN, FNP-C Internal medicine

## 2024-04-23 ENCOUNTER — Other Ambulatory Visit

## 2024-04-25 ENCOUNTER — Ambulatory Visit: Admitting: Urology

## 2024-06-05 ENCOUNTER — Inpatient Hospital Stay

## 2024-06-06 ENCOUNTER — Inpatient Hospital Stay: Admitting: Oncology

## 2024-06-06 ENCOUNTER — Inpatient Hospital Stay

## 2024-06-13 ENCOUNTER — Ambulatory Visit: Admitting: Nurse Practitioner

## 2024-11-07 ENCOUNTER — Encounter: Admitting: Internal Medicine

## 2025-01-21 ENCOUNTER — Encounter: Admitting: Nurse Practitioner
